# Patient Record
Sex: Male | Born: 1935 | Race: Black or African American | Hispanic: No | Marital: Married | Smoking: Former smoker
Health system: Southern US, Community
[De-identification: ages and names within clinical notes are randomized; demographics above are authoritative.]

## PROBLEM LIST (undated history)

## (undated) DIAGNOSIS — N289 Disorder of kidney and ureter, unspecified: Secondary | ICD-10-CM

## (undated) DIAGNOSIS — F0390 Unspecified dementia without behavioral disturbance: Secondary | ICD-10-CM

## (undated) DIAGNOSIS — N189 Chronic kidney disease, unspecified: Secondary | ICD-10-CM

## (undated) DIAGNOSIS — E785 Hyperlipidemia, unspecified: Secondary | ICD-10-CM

## (undated) DIAGNOSIS — I5189 Other ill-defined heart diseases: Secondary | ICD-10-CM

## (undated) DIAGNOSIS — R351 Nocturia: Secondary | ICD-10-CM

## (undated) DIAGNOSIS — N210 Calculus in bladder: Secondary | ICD-10-CM

## (undated) DIAGNOSIS — D631 Anemia in chronic kidney disease: Secondary | ICD-10-CM

## (undated) DIAGNOSIS — N183 Chronic kidney disease, stage 3 (moderate): Secondary | ICD-10-CM

## (undated) DIAGNOSIS — M1990 Unspecified osteoarthritis, unspecified site: Secondary | ICD-10-CM

## (undated) DIAGNOSIS — I509 Heart failure, unspecified: Secondary | ICD-10-CM

## (undated) DIAGNOSIS — E119 Type 2 diabetes mellitus without complications: Secondary | ICD-10-CM

## (undated) DIAGNOSIS — I10 Essential (primary) hypertension: Secondary | ICD-10-CM

## (undated) DIAGNOSIS — I350 Nonrheumatic aortic (valve) stenosis: Secondary | ICD-10-CM

## (undated) DIAGNOSIS — Z87448 Personal history of other diseases of urinary system: Secondary | ICD-10-CM

## (undated) HISTORY — DX: Anemia in chronic kidney disease: D63.1

## (undated) HISTORY — DX: Chronic kidney disease, unspecified: N18.9

## (undated) HISTORY — DX: Chronic kidney disease, stage 3 (moderate): N18.3

## (undated) HISTORY — PX: COLONOSCOPY: SHX174

## (undated) HISTORY — PX: TONSILLECTOMY: SUR1361

## (undated) HISTORY — DX: Other ill-defined heart diseases: I51.89

## (undated) HISTORY — DX: Nonrheumatic aortic (valve) stenosis: I35.0

---
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
## 2004-08-14 ENCOUNTER — Inpatient Hospital Stay (HOSPITAL_COMMUNITY): Admission: EM | Admit: 2004-08-14 | Discharge: 2004-08-16 | Payer: Self-pay | Admitting: Emergency Medicine

## 2006-09-01 ENCOUNTER — Ambulatory Visit: Payer: Self-pay | Admitting: Family Medicine

## 2010-10-08 ENCOUNTER — Ambulatory Visit: Payer: Self-pay | Admitting: Family Medicine

## 2012-07-04 ENCOUNTER — Encounter (HOSPITAL_COMMUNITY): Payer: Self-pay

## 2012-07-04 ENCOUNTER — Emergency Department (HOSPITAL_COMMUNITY)
Admission: EM | Admit: 2012-07-04 | Discharge: 2012-07-04 | Disposition: A | Discharge status: Home / Self Care | Payer: Medicare Other | Attending: Emergency Medicine | Admitting: Emergency Medicine

## 2012-07-04 DIAGNOSIS — R350 Frequency of micturition: Secondary | ICD-10-CM | POA: Insufficient documentation

## 2012-07-04 DIAGNOSIS — N390 Urinary tract infection, site not specified: Secondary | ICD-10-CM | POA: Insufficient documentation

## 2012-07-04 DIAGNOSIS — Z79899 Other long term (current) drug therapy: Secondary | ICD-10-CM | POA: Insufficient documentation

## 2012-07-04 DIAGNOSIS — R319 Hematuria, unspecified: Secondary | ICD-10-CM | POA: Insufficient documentation

## 2012-07-04 DIAGNOSIS — E119 Type 2 diabetes mellitus without complications: Secondary | ICD-10-CM | POA: Insufficient documentation

## 2012-07-04 LAB — POCT I-STAT, CHEM 8
Hemoglobin: 11.6 g/dL — ABNORMAL LOW (ref 13.0–17.0)
Sodium: 142 mEq/L (ref 135–145)
TCO2: 23 mmol/L (ref 0–100)

## 2012-07-04 LAB — URINALYSIS, ROUTINE W REFLEX MICROSCOPIC
Nitrite: NEGATIVE
Specific Gravity, Urine: 1.022 (ref 1.005–1.030)
Urobilinogen, UA: 0.2 mg/dL (ref 0.0–1.0)

## 2012-07-04 LAB — URINE MICROSCOPIC-ADD ON

## 2012-07-04 MED ORDER — CEPHALEXIN 500 MG PO CAPS: 500.0000 mg | ORAL_CAPSULE | Freq: Four times a day (QID) | ORAL | Status: DC

## 2012-07-04 MED ORDER — CEPHALEXIN 250 MG PO CAPS
500.0000 mg | ORAL_CAPSULE | Freq: Once | ORAL | Status: AC
  Administered 2012-07-04: 500 mg via ORAL
  Filled 2012-07-04: qty 2

## 2012-07-04 NOTE — ED Notes (Signed)
Pt. Was placed on antibiotics 2 weeks ago for UTI .  His doctor informed him if he saw any blood to come to an ED.  Pt. Having frequency and pressure intermittently.

## 2012-07-04 NOTE — ED Provider Notes (Addendum)
History  This chart was scribed for Brian Sprout, MD by Brian [name]. This patient was seen in room TR05C/TR05C and the patient's care was started at 14:34.   CSN: [id number]  Arrival date & time 07/04/12  1329   First MD Initiated Contact with Patient 07/04/12 1434      Chief Complaint  Patient presents with  . Hematuria    (Consider location/radiation/quality/duration/timing/severity/associated sxs/prior treatment) The history is provided by the patient. No language interpreter was used.  Brian [name] is a 78 y.o. male who presents to the Emergency Department complaining of urinary frequency, intermittent pressure, and hematuria since this morning. Pt reports earlier this morning he noticed bright red blood at the beginning of his stream then later he noticed darker blood, with no passed blood clots noticed. Pt reports peeing about 4-5 times today, only sometimes with blood present. Pt was seen by a doctor in [place] a couple weeks ago for a UTI; he was given ciprofloxacin (which he finished 4 days ago) and was told to come to an ED if he notices any blood. The doctor did not do any lab work but he did check his prostate with no issues noted. Pt deneis any associated dysuria now or before taking the antibiotics. Pt denies getting UTIs often. Pt has also been on meloxicann for a few months now. Pt has a h/o arthritis in his back. The pt does not smoke.   Past Medical History  Diagnosis Date  . Diabetes mellitus without complication     History reviewed. No pertinent past surgical history.  No family history on file.  History  Substance Use Topics  . Smoking status: Never Smoker   . Smokeless tobacco: Not on file  . Alcohol Use: Yes     Comment: occassional      Review of Systems  Constitutional: Negative for fever and chills.  Respiratory: Negative for shortness of breath.   Gastrointestinal: Negative for nausea, vomiting and abdominal pain.  Genitourinary:  Positive for frequency and hematuria. Negative for dysuria.  Neurological: Negative for weakness.    Allergies  Review of patient's allergies indicates no known allergies.  Home Medications   Current Outpatient Rx  Name  Route  Sig  Dispense  Refill  . CALCIUM CARBONATE-VITAMIN D 500-200 MG-UNIT PO TABS   Oral   Take 1 tablet by mouth daily.         Marland Kitchen DIPHENOXYLATE-ATROPINE 2.5-0.025 MG PO TABS   Oral   Take 1 tablet by mouth 4 (four) times daily as needed. For diarrhea         . FENOFIBRATE 160 MG PO TABS   Oral   Take 160 mg by mouth daily.         Marland Kitchen FINASTERIDE 5 MG PO TABS   Oral   Take 5 mg by mouth daily.         . GLYBURIDE-METFORMIN 2.5-500 MG PO TABS   Oral   Take 1 tablet by mouth daily with breakfast.         . MELOXICAM 7.5 MG PO TABS   Oral   Take 7.5 mg by mouth daily.         . QUINAPRIL HCL 20 MG PO TABS   Oral   Take 20 mg by mouth at bedtime.           Triage vitals: BP 156/93  Pulse 92  Temp 98.3 F (36.8 C) (Oral)  Resp 20  Ht 5' 10.5" (1.791 m)  Wt 168 lb (76.204 kg)  BMI 23.76 kg/m2  SpO2 94%  Physical Exam  Nursing note and vitals reviewed. Constitutional: He is oriented to person, place, and time. He appears well-developed and well-nourished. No distress.  HENT:  Head: Normocephalic and atraumatic.  Eyes: EOM are normal. Pupils are equal, round, and reactive to light.  Neck: Neck supple. No tracheal deviation present.  Cardiovascular: Normal rate.   Pulmonary/Chest: Effort normal. No respiratory distress.  Abdominal: Soft. He exhibits no distension.  Musculoskeletal: Normal range of motion. He exhibits no edema.  Neurological: He is alert and oriented to person, place, and time.  Skin: Skin is warm and dry.  Psychiatric: He has a normal mood and affect.    ED Course  Procedures (including critical care time) DIAGNOSTIC STUDIES: Oxygen Saturation is 94% on room air, adequate by my interpretation.     COORDINATION OF CARE: 14:45--I evaluated the patient and we discussed a treatment plan including urinalysis to which the pt agreed.   Labs Reviewed  URINALYSIS, ROUTINE W REFLEX MICROSCOPIC - Abnormal; Notable for the following:    Color, Urine RED (*)  BIOCHEMICALS MAY BE AFFECTED BY COLOR   APPearance TURBID (*)     Hgb urine dipstick LARGE (*)     Bilirubin Urine SMALL (*)     Ketones, ur 15 (*)     Protein, ur 100 (*)     Leukocytes, UA SMALL (*)     All other components within normal limits  URINE MICROSCOPIC-ADD ON - Abnormal; Notable for the following:    Squamous Epithelial / LPF FEW (*)     Bacteria, UA FEW (*)     Casts HYALINE CASTS (*)     Crystals CA OXALATE CRYSTALS (*)     All other components within normal limits  POCT I-STAT, CHEM 8 - Abnormal; Notable for the following:    Creatinine, Ser 1.40 (*)     Glucose, Bld 62 (*)     Hemoglobin 11.6 (*)     HCT 34.0 (*)     All other components within normal limits  URINE CULTURE   No results found.   No diagnosis found.    MDM   Patient presenting due to hematuria and frequency. He has normal vital signs today except for a slightly elevated blood pressure. He denies any symptoms concerning for hilar nephritis. He states for the last 2 weeks he had been taking ciprofloxacin for possible urinary tract infection. He denies any symptoms concerning for urinary retention. He has no prior prostate problems and does not smoke. UA shows a large amount of blood with a small amount of leukocytes and bacteria. There are 3-6 white blood cells and may be a resistant infection. A urine culture was done. Patient states he has not had his kidney function checked in quite some time and an i-STAT was ordered  3:15 PM I-stat normal with Cr of 1.4.  Will d/c with keflex and pt has f/u with PCP tomorrow.    I personally performed the services described in this documentation, which was scribed in my presence.  The recorded  information has been reviewed and considered.    Brian Sprout, MD 07/04/12 1510  Brian Sprout, MD 07/04/12 1515

## 2012-07-06 LAB — URINE CULTURE
Colony Count: NO GROWTH
Culture: NO GROWTH

## 2012-08-17 ENCOUNTER — Emergency Department (HOSPITAL_COMMUNITY)
Admission: EM | Admit: 2012-08-17 | Discharge: 2012-08-17 | Disposition: A | Discharge status: Home / Self Care | Payer: Medicare Other | Attending: Emergency Medicine | Admitting: Emergency Medicine

## 2012-08-17 ENCOUNTER — Encounter (HOSPITAL_COMMUNITY): Payer: Self-pay | Admitting: *Deleted

## 2012-08-17 DIAGNOSIS — Z79899 Other long term (current) drug therapy: Secondary | ICD-10-CM | POA: Insufficient documentation

## 2012-08-17 DIAGNOSIS — R739 Hyperglycemia, unspecified: Secondary | ICD-10-CM

## 2012-08-17 DIAGNOSIS — E1169 Type 2 diabetes mellitus with other specified complication: Secondary | ICD-10-CM | POA: Insufficient documentation

## 2012-08-17 LAB — GLUCOSE, CAPILLARY: Glucose-Capillary: 276 mg/dL — ABNORMAL HIGH (ref 70–99)

## 2012-08-17 MED ORDER — GLYBURIDE 5 MG PO TABS: 5.0000 mg | ORAL_TABLET | Freq: Every day | ORAL | Status: DC

## 2012-08-17 MED ORDER — GLYBURIDE 5 MG PO TABS
5.0000 mg | ORAL_TABLET | Freq: Once | ORAL | Status: AC
  Administered 2012-08-17: 5 mg via ORAL
  Filled 2012-08-17: qty 1

## 2012-08-17 NOTE — ED Provider Notes (Signed)
History   This chart was scribed for Brian Sprout, MD by Sofie Rower, ED Scribe. The patient was seen in room TR08C/TR08C and the patient's care was started at 1:13PM.     CSN: [id number]  Arrival date & time 08/17/12  1300   First MD Initiated Contact with Patient 08/17/12 1313      No chief complaint on file.   (Consider location/radiation/quality/duration/timing/severity/associated sxs/prior treatment) Patient is a 78 y.o. male presenting with general illness. The history is provided by the patient. No language interpreter was used.  Illness  The current episode started today. The onset was sudden. The problem occurs rarely. The problem has been gradually worsening. The problem is moderate. Nothing relieves the symptoms. Nothing aggravates the symptoms. He has been behaving normally.    Brian [name] is a 78 y.o. male , with a hx of diabets, who presents to the Emergency Department complaining of sudden, progressively worsening, hyperglycemia, onset today (08/17/12). The pt reports he had a CT scan performed yesterday, 08/16/12 at [facility], however he was unaware that he could not take his metformin medication until 48 hours after the scan. The pt is concerned due to his blood sugar being 348 today, elevated from his baseline.  The pt does not smoke, however, he does drink alcohol occasionally.   PCP is Dr. Thana Ates.    Past Medical History  Diagnosis Date  . Diabetes mellitus without complication     History reviewed. No pertinent past surgical history.  No family history on file.  History  Substance Use Topics  . Smoking status: Never Smoker   . Smokeless tobacco: Not on file  . Alcohol Use: Yes     Comment: occassional      Review of Systems  All other systems reviewed and are negative.    Allergies  Review of patient's allergies indicates no known allergies.  Home Medications   Current Outpatient Rx  Name  Route  Sig  Dispense  Refill  . CALCIUM  CARBONATE-VITAMIN D 500-200 MG-UNIT PO TABS   Oral   Take 1 tablet by mouth daily.         . CEPHALEXIN 500 MG PO CAPS   Oral   Take 1 capsule (500 mg total) by mouth 4 (four) times daily.   28 capsule   0   . DIPHENOXYLATE-ATROPINE 2.5-0.025 MG PO TABS   Oral   Take 1 tablet by mouth 4 (four) times daily as needed. For diarrhea         . FENOFIBRATE 160 MG PO TABS   Oral   Take 160 mg by mouth daily.         Marland Kitchen FINASTERIDE 5 MG PO TABS   Oral   Take 5 mg by mouth daily.         . GLYBURIDE-METFORMIN 2.5-500 MG PO TABS   Oral   Take 1 tablet by mouth daily with breakfast.         . MELOXICAM 7.5 MG PO TABS   Oral   Take 7.5 mg by mouth daily.         . QUINAPRIL HCL 20 MG PO TABS   Oral   Take 20 mg by mouth at bedtime.           BP 170/96  Pulse 104  Temp 98.5 F (36.9 C) (Oral)  Resp 18  SpO2 98%  Physical Exam  Nursing note and vitals reviewed. Constitutional: He is oriented to person, place, and time. He  appears well-developed and well-nourished. No distress.  HENT:  Head: Normocephalic and atraumatic.  Nose: Nose normal.  Mouth/Throat: Oropharynx is clear and moist.  Eyes: Conjunctivae normal and EOM are normal. Pupils are equal, round, and reactive to light.  Neck: Normal range of motion. Neck supple.  Cardiovascular: Normal rate, regular rhythm and intact distal pulses.   No murmur heard. Pulmonary/Chest: Effort normal and breath sounds normal. No respiratory distress. He has no wheezes. He has no rales.  Abdominal: Soft. Bowel sounds are normal. He exhibits no distension. There is no tenderness. There is no rebound and no guarding.  Musculoskeletal: Normal range of motion. He exhibits no edema and no tenderness.  Neurological: He is alert and oriented to person, place, and time.  Skin: Skin is warm and dry. No rash noted. No erythema.  Psychiatric: He has a normal mood and affect. His behavior is normal.    ED Course  Procedures  (including critical care time)  DIAGNOSTIC STUDIES: Oxygen Saturation is 98% on room air, normal by my interpretation.    COORDINATION OF CARE:  1:22 PM- Treatment plan discussed with patient. Pt agrees with treatment.      Labs Reviewed  GLUCOSE, CAPILLARY - Abnormal; Notable for the following:    Glucose-Capillary 276 (*)     All other components within normal limits   No results found.   1. Hyperglycemia       MDM   Patient had a CT scan done yesterday and was taken off his Glucovance. He is unable to take the Glucovance until Thursday but states his blood sugar is starting to run high and he has nothing he can take for it. He is asymptomatic at this time and has no complaints. Patient's blood sugar here is 274. We'll give him glyburide/metformin can take for the next 3 days until he can resume his Glucovance.      I personally performed the services described in this documentation, which was scribed in my presence.  The recorded information has been reviewed and considered.    Brian Sprout, MD 08/17/12 1328

## 2012-08-17 NOTE — ED Notes (Signed)
Pt had CT scan yesterday at [facility] and has to hold off taking metformin till Thursday, but concerned because sugar up to 348 today.  No insulin

## 2013-03-21 ENCOUNTER — Encounter (HOSPITAL_BASED_OUTPATIENT_CLINIC_OR_DEPARTMENT_OTHER): Payer: Self-pay | Admitting: *Deleted

## 2013-03-21 ENCOUNTER — Other Ambulatory Visit: Payer: Self-pay | Admitting: Urology

## 2013-03-21 NOTE — Progress Notes (Signed)
NPO AFTER MN. ARRIVES AT 0900. NEEDS ISTAT AND EKG.

## 2013-03-22 ENCOUNTER — Encounter (HOSPITAL_BASED_OUTPATIENT_CLINIC_OR_DEPARTMENT_OTHER)
Admission: RE | Disposition: A | Discharge status: Home / Self Care | Payer: Self-pay | Source: Ambulatory Visit | Attending: Urology

## 2013-03-22 ENCOUNTER — Encounter (HOSPITAL_BASED_OUTPATIENT_CLINIC_OR_DEPARTMENT_OTHER): Payer: Self-pay | Admitting: *Deleted

## 2013-03-22 ENCOUNTER — Ambulatory Visit (HOSPITAL_BASED_OUTPATIENT_CLINIC_OR_DEPARTMENT_OTHER)
Admission: RE | Admit: 2013-03-22 | Discharge: 2013-03-22 | Disposition: A | Discharge status: Home / Self Care | Payer: Medicare Other | Source: Ambulatory Visit | Attending: Urology | Admitting: Urology

## 2013-03-22 ENCOUNTER — Ambulatory Visit (HOSPITAL_BASED_OUTPATIENT_CLINIC_OR_DEPARTMENT_OTHER): Payer: Medicare Other | Admitting: Anesthesiology

## 2013-03-22 ENCOUNTER — Encounter (HOSPITAL_BASED_OUTPATIENT_CLINIC_OR_DEPARTMENT_OTHER): Payer: Self-pay | Admitting: Anesthesiology

## 2013-03-22 ENCOUNTER — Other Ambulatory Visit: Payer: Self-pay

## 2013-03-22 DIAGNOSIS — I10 Essential (primary) hypertension: Secondary | ICD-10-CM | POA: Insufficient documentation

## 2013-03-22 DIAGNOSIS — Z79899 Other long term (current) drug therapy: Secondary | ICD-10-CM | POA: Insufficient documentation

## 2013-03-22 DIAGNOSIS — N210 Calculus in bladder: Secondary | ICD-10-CM | POA: Insufficient documentation

## 2013-03-22 DIAGNOSIS — E119 Type 2 diabetes mellitus without complications: Secondary | ICD-10-CM | POA: Insufficient documentation

## 2013-03-22 HISTORY — DX: Hyperlipidemia, unspecified: E78.5

## 2013-03-22 HISTORY — DX: Nocturia: R35.1

## 2013-03-22 HISTORY — PX: CYSTOSCOPY/RETROGRADE/URETEROSCOPY/STONE EXTRACTION WITH BASKET: SHX5317

## 2013-03-22 HISTORY — DX: Essential (primary) hypertension: I10

## 2013-03-22 HISTORY — DX: Calculus in bladder: N21.0

## 2013-03-22 HISTORY — DX: Type 2 diabetes mellitus without complications: E11.9

## 2013-03-22 HISTORY — DX: Personal history of other diseases of urinary system: Z87.448

## 2013-03-22 LAB — POCT I-STAT 4, (NA,K, GLUC, HGB,HCT)
Glucose, Bld: 145 mg/dL — ABNORMAL HIGH (ref 70–99)
HCT: 38 % — ABNORMAL LOW (ref 39.0–52.0)
Hemoglobin: 12.9 g/dL — ABNORMAL LOW (ref 13.0–17.0)

## 2013-03-22 LAB — GLUCOSE, CAPILLARY: Glucose-Capillary: 132 mg/dL — ABNORMAL HIGH (ref 70–99)

## 2013-03-22 SURGERY — CYSTOSCOPY, WITH CALCULUS REMOVAL USING BASKET
Anesthesia: General | Site: Bladder | Wound class: Clean Contaminated

## 2013-03-22 MED ORDER — ONDANSETRON HCL 4 MG/2ML IJ SOLN
INTRAMUSCULAR | Status: DC | PRN
  Administered 2013-03-22: 4 mg via INTRAVENOUS

## 2013-03-22 MED ORDER — PROPOFOL 10 MG/ML IV BOLUS
INTRAVENOUS | Status: DC | PRN
  Administered 2013-03-22: 50 mg via INTRAVENOUS
  Administered 2013-03-22: 150 mg via INTRAVENOUS

## 2013-03-22 MED ORDER — FENTANYL CITRATE 0.05 MG/ML IJ SOLN
INTRAMUSCULAR | Status: DC | PRN
  Administered 2013-03-22 (×2): 50 ug via INTRAVENOUS

## 2013-03-22 MED ORDER — LIDOCAINE HCL (CARDIAC) 20 MG/ML IV SOLN
INTRAVENOUS | Status: DC | PRN
  Administered 2013-03-22: 50 mg via INTRAVENOUS

## 2013-03-22 MED ORDER — MIDAZOLAM HCL 5 MG/5ML IJ SOLN
INTRAMUSCULAR | Status: DC | PRN
  Administered 2013-03-22: 1 mg via INTRAVENOUS

## 2013-03-22 MED ORDER — LACTATED RINGERS IV SOLN
INTRAVENOUS | Status: DC
  Administered 2013-03-22: 12:00:00 via INTRAVENOUS
  Filled 2013-03-22: qty 1000

## 2013-03-22 MED ORDER — LACTATED RINGERS IV SOLN
INTRAVENOUS | Status: DC
Start: 2013-03-22 — End: 2013-03-22
  Administered 2013-03-22: 10:00:00 via INTRAVENOUS
  Filled 2013-03-22: qty 1000

## 2013-03-22 MED ORDER — CEFAZOLIN SODIUM 1-5 GM-% IV SOLN
1.0000 g | INTRAVENOUS | Status: DC
  Filled 2013-03-22: qty 50

## 2013-03-22 MED ORDER — CEFAZOLIN SODIUM-DEXTROSE 2-3 GM-% IV SOLR
2.0000 g | Freq: Once | INTRAVENOUS | Status: DC
  Filled 2013-03-22: qty 50

## 2013-03-22 MED ORDER — FENTANYL CITRATE 0.05 MG/ML IJ SOLN
25.0000 ug | INTRAMUSCULAR | Status: DC | PRN
  Filled 2013-03-22: qty 1

## 2013-03-22 MED ORDER — SODIUM CHLORIDE 0.9 % IR SOLN
Status: DC | PRN
  Administered 2013-03-22: 3000 mL

## 2013-03-22 SURGICAL SUPPLY — 39 items
ADAPTER CATH URET PLST 4-6FR (CATHETERS) IMPLANT
ADPR CATH URET STRL DISP 4-6FR (CATHETERS)
BAG DRAIN URO-CYSTO SKYTR STRL (DRAIN) ×3 IMPLANT
BAG DRN UROCATH (DRAIN) ×2
BASKET LASER NITINOL 1.9FR (BASKET) IMPLANT
BASKET STNLS GEMINI 4WIRE 3FR (BASKET) IMPLANT
BASKET ZERO TIP NITINOL 2.4FR (BASKET) ×3 IMPLANT
BRUSH URET BIOPSY 3F (UROLOGICAL SUPPLIES) IMPLANT
BSKT STON RTRVL 120 1.9FR (BASKET)
BSKT STON RTRVL GEM 120X11 3FR (BASKET)
BSKT STON RTRVL ZERO TP 2.4FR (BASKET) ×2
CANISTER SUCT LVC 12 LTR MEDI- (MISCELLANEOUS) ×3 IMPLANT
CATH INTERMIT  6FR 70CM (CATHETERS) IMPLANT
CATH URET 5FR 28IN CONE TIP (BALLOONS)
CATH URET 5FR 28IN OPEN ENDED (CATHETERS) IMPLANT
CATH URET 5FR 70CM CONE TIP (BALLOONS) IMPLANT
CLOTH BEACON ORANGE TIMEOUT ST (SAFETY) ×3 IMPLANT
DRAPE CAMERA CLOSED 9X96 (DRAPES) ×3 IMPLANT
ELECT REM PT RETURN 9FT ADLT (ELECTROSURGICAL)
ELECTRODE REM PT RTRN 9FT ADLT (ELECTROSURGICAL) IMPLANT
GLOVE BIO SURGEON STRL SZ7 (GLOVE) ×3 IMPLANT
GLOVE ECLIPSE 6.5 STRL STRAW (GLOVE) ×3 IMPLANT
GLOVE INDICATOR 6.5 STRL GRN (GLOVE) ×6 IMPLANT
GOWN PREVENTION PLUS LG XLONG (DISPOSABLE) ×3 IMPLANT
GUIDEWIRE 0.038 PTFE COATED (WIRE) IMPLANT
GUIDEWIRE ANG ZIPWIRE 038X150 (WIRE) IMPLANT
GUIDEWIRE STR DUAL SENSOR (WIRE) IMPLANT
IV NS IRRIG 3000ML ARTHROMATIC (IV SOLUTION) ×3 IMPLANT
KIT BALLIN UROMAX 15FX10 (LABEL) IMPLANT
KIT BALLN UROMAX 15FX4 (MISCELLANEOUS) IMPLANT
KIT BALLN UROMAX 26 75X4 (MISCELLANEOUS)
LASER FIBER DISP (UROLOGICAL SUPPLIES) IMPLANT
LASER FIBER DISP 1000U (UROLOGICAL SUPPLIES) IMPLANT
PACK CYSTOSCOPY (CUSTOM PROCEDURE TRAY) ×3 IMPLANT
SET HIGH PRES BAL DIL (LABEL)
SHEATH ACCESS URETERAL 38CM (SHEATH) IMPLANT
SHEATH ACCESS URETERAL 54CM (SHEATH) IMPLANT
SHEATH URET ACCESS 12FR/35CM (UROLOGICAL SUPPLIES) IMPLANT
SHEATH URET ACCESS 12FR/55CM (UROLOGICAL SUPPLIES) IMPLANT

## 2013-03-22 NOTE — Procedures (Signed)
Ok for patient to leave---- voided 50cc

## 2013-03-22 NOTE — Anesthesia Postprocedure Evaluation (Signed)
  Anesthesia Post-op Note  Patient: Brian [name]  Procedure(s) Performed: Procedure(s) (LRB): CYSTOSCOPY BLADDER STONE STONE EXTRACTION (N/A)  Patient Location: PACU  Anesthesia Type: General  Level of Consciousness: awake and alert   Airway and Oxygen Therapy: Patient Spontanous Breathing  Post-op Pain: mild  Post-op Assessment: Post-op Vital signs reviewed, Patient's Cardiovascular Status Stable, Respiratory Function Stable, Patent Airway and No signs of Nausea or vomiting  Last Vitals:  Filed Vitals:   03/22/13 1145  BP: 130/76  Pulse: 72  Temp:   Resp: 16    Post-op Vital Signs: stable   Complications: No apparent anesthesia complications

## 2013-03-22 NOTE — H&P (Signed)
History of Present Illness  [name] returns today still complaining of hesitancy, decreased stream, dribbling, sensation of incomplete bladder emptying.  He has not passed a stone.  Cystoscopy shows a stone in the prostatic urethra.  I was able to push it back in the bladder. He needs stone extraction.       [name] has a history of hematuria.  A CT hematuria protocol in 2013 showed a punctate left renal calculus and 2 stones in the right distal ureter, a left renal cyst and left adrenal adenoma.  It was thought that he passed both ureteral stones.  Cystoscopy for hematuria was negative for malignancy.  He was on tamsulosin and finasteride but is no longer on either medication.  Interval history: [name] presents today with pain at the end of his penis while voiding. He says it feels like he is passing a stone. This has been present for less than 1 week.  Nothing makes it better or worse.  It is not associated with flank pain, other LUTS, hematuria, n/v, or f/c.   Past Medical History Problems  1. History of  Arthritis V13.4 2. History of  Diabetes Mellitus 250.00  Surgical History Problems  1. History of  Complete Colonoscopy 2. History of  Rectal Surgery  Current Meds 1. Fenofibrate 160 MG Oral Tablet; Therapy: 11Dec2013 to 2. GlyBURIDE 2.5 MG Oral Tablet; Therapy: (Recorded:19Dec2013) to 3. Lomotil 2.5-0.025 MG Oral Tablet; Therapy: (Recorded:19Dec2013) to 4. Meloxicam 7.5 MG Oral Tablet; Therapy: (Recorded:19Dec2013) to 5. Proscar TABS; Therapy: (Recorded:19Dec2013) to 6. Quinapril HCl 20 MG Oral Tablet; Therapy: (Recorded:19Dec2013) to 7. Tamsulosin HCl 0.4 MG Oral Capsule; TAKE 1 CAPSULE Bedtime; Therapy: 16Jan2014 to (Last  Rx:16Jan2014)  Requested for: 16Jan2014 8. Tamsulosin HCl 0.4 MG Oral Capsule; Take one (1) capsule daily; Therapy: 23Dec2013 to  (Evaluate:22Jan2014)  Requested for: 23Dec2013; Last Rx:23Dec2013 9. Vitamin D TABS; Therapy: (Recorded:19Dec2013)  to  Allergies Medication  1. No Known Drug Allergies  Family History Problems  1. Family history of  Congestive Heart Failure With Systolic And Diastolic Dysfunction 2. Family history of  Family Health Status Number Of Children 3 sons / 1 daughter 3. Family history of  Father Deceased At Age ____ 4. Family history of  Mother Deceased At Age ____  Social History Problems  1. Caffeine Use 1 2. Marital History - Currently Married 3. Never A Smoker 4. Retired From Work Denied  5. History of  Alcohol Use  Review of Systems Genitourinary, constitutional, skin, eye, otolaryngeal, hematologic/lymphatic, cardiovascular, pulmonary, endocrine, musculoskeletal, gastrointestinal, neurological and psychiatric system(s) were reviewed and pertinent findings if present are noted.  Genitourinary: urinary frequency, feelings of urinary urgency, difficulty starting the urinary stream, weak urinary stream, urinary stream starts and stops, incomplete emptying of bladder and initiating urination requires straining.    Vitals Vital Signs [Data Includes: Last 1 Day]  28Jul2014 02:07PM  Blood Pressure: 157 / 85 Heart Rate: 90 Respiration: 18  Physical Exam Constitutional: Well nourished and well developed . No acute distress.  ENT:. The ears and nose are normal in appearance.  Neck: The appearance of the neck is normal and no neck mass is present.  Pulmonary: No respiratory distress and normal respiratory rhythm and effort.  Cardiovascular: Heart rate and rhythm are normal . No peripheral edema.  Abdomen: The abdomen is soft and nontender. No masses are palpated. No CVA tenderness. No hernias are palpable. No hepatosplenomegaly noted.  Genitourinary: Examination of the penis demonstrates no discharge, no masses, no lesions  and a normal meatus. The scrotum is without lesions. The right epididymis is palpably normal and non-tender. The left epididymis is palpably normal and non-tender. The right testis  is non-tender and without masses. The left testis is non-tender and without masses.  Lymphatics: The femoral and inguinal nodes are not enlarged or tender.  Skin: Normal skin turgor, no visible rash and no visible skin lesions.  Neuro/Psych:. Mood and affect are appropriate.    Results/Data Urine [Data Includes: Last 1 Day]   28Jul2014  COLOR YELLOW   APPEARANCE CLEAR   SPECIFIC GRAVITY 1.025   pH 5.5   GLUCOSE 100 mg/dL  BILIRUBIN NEG   KETONE NEG mg/dL  BLOOD MOD   PROTEIN NEG mg/dL  UROBILINOGEN 1 mg/dL  NITRITE NEG   LEUKOCYTE ESTERASE NEG   SQUAMOUS EPITHELIAL/HPF FEW   WBC 0-2 WBC/hpf  RBC 7-10 RBC/hpf  BACTERIA NONE SEEN   CRYSTALS NONE SEEN   CASTS NONE SEEN    Procedure  Procedure: Cystoscopy   Indication: Lower Urinary Tract Symptoms.  Informed Consent: Risks, benefits, and potential adverse events were discussed and informed consent was obtained from the patient . Specific risks including, but not limited to bleeding, infection, pain, allergic reaction etc. were explained.  Prep: The patient was prepped with betadine.  Anesthesia:. Local anesthesia was administered intraurethrally with 2% lidocaine jelly.  Antibiotic prophylaxis: Ciprofloxacin.  Procedure Note:  Urethral meatus:. No abnormalities.  Anterior urethra: No abnormalities.  Prostatic urethra:. The lateral and median prostatic lobes were enlarged.  Bladder: Visulization was clear. The ureteral orifices were in the normal anatomic position bilaterally. A systematic survey of the bladder demonstrated no bladder tumors or stones. The mucosa was smooth without abnormalities. A single  a stone was present measuring approximately 6 cm in size.    Assessment Assessed  1. Bladder Calculus 594.1  Plan Health Maintenance (V70.0)  1. UA With REFLEX  Done: 28Jul2014 01:52PM   Cystoscopy. Stone extraction in AM.  The procedure, risks, benefits were explained to the patient and his wife.  The risks include but  are not limited to hemorrhage, infection, bladder injury.  They understand and are agreeable.

## 2013-03-22 NOTE — Op Note (Signed)
Brian [name] is a 79 y.o.   03/22/2013  General  Preop Diagnosis: Bladder calculus  Postop Diagnosis: Same  Procedure done: Cystoscopy, bladder stone extraction  Surgeon: Wendie Simmer. Talyah Seder  Anesthesia: General  Indication: Patient is a 79 years old male who was seen in the office 5 days ago complaining of frequency, hesitancy, straining on urination and voiding small amount of urine at a time. He has a past history of kidney stone. Abdomen ultrasound showed normal kidneys and a possible stone in the bladder. Cystoscopy yesterday showed a 6 mm stone in the prostatic urethra. I manipulated the stone back in the bladder. He is a scheduled today for bladder stone extraction  Procedure: Patient was identified by his wrist band and proper timeout was taken.  Under general anesthesia he was prepped and draped and placed in the dorsolithotomy position. A panendoscope was inserted in the bladder. The anterior urethra is normal. He has a trilobar prostatic hypertrophy. The stone is  identified at the bladder neck. With irrigation the stone migrated back in the bladder. A Nitinol basket was passed through the cystoscope and the stone was caught within the wires of the basket and extracted without difficulty. The cystoscope was then reinserted in the bladder. The bladder mucosa is normal. There is no tumor in the bladder. There is no residual stone in the bladder. The ureteral orifices are in normal position and shape.  The bladder was then emptied and the cystoscope removed.  The patient tolerated the procedure well and left the OR in satisfactory condition to postanesthesia care unit.

## 2013-03-22 NOTE — Anesthesia Preprocedure Evaluation (Addendum)
Anesthesia Evaluation  Patient identified by MRN, date of birth, ID band Patient awake    Reviewed: Allergy & Precautions, H&P , NPO status , Patient's Chart, lab work & pertinent test results  Airway Mallampati: II TM Distance: >3 FB Neck ROM: full    Dental  (+) Edentulous Upper, Edentulous Lower and Dental Advisory Given   Pulmonary neg pulmonary ROS,  breath sounds clear to auscultation  Pulmonary exam normal       Cardiovascular Exercise Tolerance: Good hypertension, Pt. on medications Rhythm:regular Rate:Normal     Neuro/Psych negative neurological ROS  negative psych ROS   GI/Hepatic negative GI ROS, Neg liver ROS,   Endo/Other  diabetes, Well Controlled, Type 2, Oral Hypoglycemic Agents  Renal/GU negative Renal ROS  negative genitourinary   Musculoskeletal   Abdominal   Peds  Hematology negative hematology ROS (+)   Anesthesia Other Findings   Reproductive/Obstetrics negative OB ROS                          Anesthesia Physical Anesthesia Plan  ASA: III  Anesthesia Plan: General   Post-op Pain Management:    Induction: Intravenous  Airway Management Planned: LMA  Additional Equipment:   Intra-op Plan:   Post-operative Plan:   Informed Consent: I have reviewed the patients History and Physical, chart, labs and discussed the procedure including the risks, benefits and alternatives for the proposed anesthesia with the patient or authorized representative who has indicated his/her understanding and acceptance.   Dental Advisory Given  Plan Discussed with: CRNA and Surgeon  Anesthesia Plan Comments:         Anesthesia Quick Evaluation

## 2013-03-22 NOTE — Transfer of Care (Signed)
Immediate Anesthesia Transfer of Care Note  Patient: Brian [name]  Procedure(s) Performed: Procedure(s) with comments: CYSTOSCOPY BLADDER STONE STONE EXTRACTION (N/A) - CYSTOSCOPY    Patient Location: PACU  Anesthesia Type:General  Level of Consciousness: sedated  Airway & Oxygen Therapy: Patient Spontanous Breathing and Patient connected to nasal cannula oxygen  Post-op Assessment: Report given to PACU RN and Post -op Vital signs reviewed and stable  Post vital signs: stable  Complications: No apparent anesthesia complications

## 2013-03-23 ENCOUNTER — Encounter (HOSPITAL_BASED_OUTPATIENT_CLINIC_OR_DEPARTMENT_OTHER): Payer: Self-pay | Admitting: Urology

## 2013-11-28 ENCOUNTER — Ambulatory Visit: Payer: Self-pay | Admitting: Nephrology

## 2013-12-15 ENCOUNTER — Other Ambulatory Visit (HOSPITAL_COMMUNITY): Payer: Self-pay | Admitting: Urology

## 2013-12-15 DIAGNOSIS — D49519 Neoplasm of unspecified behavior of unspecified kidney: Secondary | ICD-10-CM

## 2013-12-28 ENCOUNTER — Ambulatory Visit (HOSPITAL_COMMUNITY)
Admission: RE | Admit: 2013-12-28 | Discharge: 2013-12-28 | Disposition: A | Discharge status: Home / Self Care | Payer: Medicare Other | Source: Ambulatory Visit | Attending: Urology | Admitting: Urology

## 2013-12-28 DIAGNOSIS — D49519 Neoplasm of unspecified behavior of unspecified kidney: Secondary | ICD-10-CM

## 2013-12-28 DIAGNOSIS — N281 Cyst of kidney, acquired: Secondary | ICD-10-CM | POA: Insufficient documentation

## 2013-12-28 DIAGNOSIS — D3500 Benign neoplasm of unspecified adrenal gland: Secondary | ICD-10-CM | POA: Insufficient documentation

## 2013-12-28 LAB — POCT I-STAT CREATININE: Creatinine, Ser: 1.7 mg/dL — ABNORMAL HIGH (ref 0.50–1.35)

## 2013-12-28 MED ORDER — GADOBENATE DIMEGLUMINE 529 MG/ML IV SOLN
15.0000 mL | Freq: Once | INTRAVENOUS | Status: AC | PRN
  Administered 2013-12-28: 15 mL via INTRAVENOUS

## 2015-02-13 ENCOUNTER — Other Ambulatory Visit: Payer: Self-pay

## 2015-02-13 DIAGNOSIS — I10 Essential (primary) hypertension: Secondary | ICD-10-CM

## 2015-02-13 NOTE — Telephone Encounter (Signed)
Received a fax from [place], requesting a refill on Quinapril HCL 20mg 

## 2015-02-14 ENCOUNTER — Other Ambulatory Visit: Payer: Self-pay

## 2015-02-14 DIAGNOSIS — I10 Essential (primary) hypertension: Secondary | ICD-10-CM

## 2015-02-14 MED ORDER — QUINAPRIL HCL 20 MG PO TABS: 20.0000 mg | ORAL_TABLET | Freq: Two times a day (BID) | ORAL | Status: DC

## 2015-02-14 MED ORDER — QUINAPRIL HCL 20 MG PO TABS: 20.0000 mg | ORAL_TABLET | Freq: Every day | ORAL | Status: DC

## 2015-02-14 NOTE — Telephone Encounter (Signed)
I got a call from [place] from [facility] wanting clarity if the rx for Quinapril should be 1/day or BID, after consulting with Dr. Nadine Counts, A verbal was given and a corrected rx was re-submitted.

## 2015-03-28 ENCOUNTER — Other Ambulatory Visit: Payer: Self-pay | Admitting: Family Medicine

## 2015-03-28 ENCOUNTER — Other Ambulatory Visit: Payer: Self-pay

## 2015-03-28 DIAGNOSIS — E1129 Type 2 diabetes mellitus with other diabetic kidney complication: Secondary | ICD-10-CM | POA: Insufficient documentation

## 2015-03-28 MED ORDER — GLUCOSE BLOOD VI STRP: ORAL_STRIP | Status: DC

## 2015-03-28 NOTE — Telephone Encounter (Signed)
Received a fax from [place] requested a refill of this patient's One Touch Ultra test strips #300.  Refill request was sent to Dr. [name] for approval and submission.

## 2015-04-12 ENCOUNTER — Other Ambulatory Visit: Payer: Self-pay

## 2015-04-12 NOTE — Telephone Encounter (Signed)
Got a fax from [facility] requesting a refill of this medication.  Refill request was sent to Dr. [name] for approval and submission.  Diphenoxylate-Atropine 2.5-0.025MG , 1 (one) Tablet take 1 or 2 tablets four times a day as needed for losse stools, #30, 10 days starting 07/04/2014, Ref. x5. Discontinued (Patient not using consistently). Associated Diagnosis:Diarrhea Recorded 01/16/2015 09:36 AM by [name], MD, Annotation/Addendum.

## 2015-04-24 ENCOUNTER — Other Ambulatory Visit: Payer: Self-pay

## 2015-04-24 DIAGNOSIS — K581 Irritable bowel syndrome with constipation: Secondary | ICD-10-CM

## 2015-04-24 MED ORDER — DOCUSATE SODIUM 100 MG PO CAPS: 100.0000 mg | ORAL_CAPSULE | Freq: Two times a day (BID) | ORAL | Status: DC

## 2015-04-24 MED ORDER — DIPHENOXYLATE-ATROPINE 2.5-0.025 MG PO TABS: 1.0000 | ORAL_TABLET | Freq: Four times a day (QID) | ORAL | Status: DC | PRN

## 2015-04-24 NOTE — Telephone Encounter (Signed)
Got a refill request for this medication from [place].  Refill request was sent to Dr. [name] for approval and submission.  Diphenoxylate-Atropine 2.5-0.025MG , 1 (one) Tablet take 1 or 2 tablets four times a day as needed for losse stools, #30, 10 days starting 07/04/2014, Ref. x5. Inactive. Associated Diagnosis:Diarrhea  Colace 100MG , 1 (one) Capsule Capsule two times daily, #60, 30 days starting 09/19/2014, Ref. x5. Active. Recorded 09/19/2014 10:09 AM by [name], MD, Annotation/Addendum. ePrescribed to [phone], 09/19/2014 10:09 AM  Recorded 04/14/2015 06:39 PM by [name], MD, Annotation/Addendum.

## 2015-05-01 ENCOUNTER — Telehealth: Payer: Self-pay | Admitting: Family Medicine

## 2015-05-01 ENCOUNTER — Other Ambulatory Visit: Payer: Self-pay

## 2015-05-01 DIAGNOSIS — K581 Irritable bowel syndrome with constipation: Secondary | ICD-10-CM

## 2015-05-01 NOTE — Telephone Encounter (Signed)
Received a fax from [place], Refill request was sent to Dr. [name] for approval and submission.

## 2015-05-01 NOTE — Telephone Encounter (Signed)
Requesting refill on Diphenoxylate-Atropine. Please call [facility]. Thank you

## 2015-05-01 NOTE — Telephone Encounter (Signed)
Got a fax from [place] requesting a refill of Lomotil  Refill request was sent to Dr. [name] for approval and submission.

## 2015-05-03 ENCOUNTER — Other Ambulatory Visit: Payer: Self-pay | Admitting: Family Medicine

## 2015-05-03 DIAGNOSIS — E1129 Type 2 diabetes mellitus with other diabetic kidney complication: Secondary | ICD-10-CM

## 2015-05-03 MED ORDER — SITAGLIPTIN PHOSPHATE 25 MG PO TABS: 25.0000 mg | ORAL_TABLET | Freq: Every day | ORAL | Status: DC

## 2015-05-08 ENCOUNTER — Ambulatory Visit (INDEPENDENT_AMBULATORY_CARE_PROVIDER_SITE_OTHER): Payer: 59 | Admitting: Family Medicine

## 2015-05-08 ENCOUNTER — Other Ambulatory Visit: Payer: Self-pay | Admitting: Family Medicine

## 2015-05-08 ENCOUNTER — Encounter: Payer: Self-pay | Admitting: Family Medicine

## 2015-05-08 VITALS — BP 128/72 | HR 77 | Temp 97.9°F | Resp 18 | Ht 67.0 in | Wt 175.2 lb

## 2015-05-08 DIAGNOSIS — N183 Chronic kidney disease, stage 3 unspecified: Secondary | ICD-10-CM

## 2015-05-08 DIAGNOSIS — N401 Enlarged prostate with lower urinary tract symptoms: Secondary | ICD-10-CM

## 2015-05-08 DIAGNOSIS — I10 Essential (primary) hypertension: Secondary | ICD-10-CM | POA: Insufficient documentation

## 2015-05-08 DIAGNOSIS — Z23 Encounter for immunization: Secondary | ICD-10-CM

## 2015-05-08 DIAGNOSIS — N2581 Secondary hyperparathyroidism of renal origin: Secondary | ICD-10-CM | POA: Insufficient documentation

## 2015-05-08 DIAGNOSIS — E785 Hyperlipidemia, unspecified: Secondary | ICD-10-CM

## 2015-05-08 DIAGNOSIS — E1129 Type 2 diabetes mellitus with other diabetic kidney complication: Secondary | ICD-10-CM | POA: Diagnosis not present

## 2015-05-08 DIAGNOSIS — D631 Anemia in chronic kidney disease: Secondary | ICD-10-CM | POA: Insufficient documentation

## 2015-05-08 DIAGNOSIS — N400 Enlarged prostate without lower urinary tract symptoms: Secondary | ICD-10-CM | POA: Insufficient documentation

## 2015-05-08 DIAGNOSIS — N1832 Chronic kidney disease, stage 3b: Secondary | ICD-10-CM | POA: Insufficient documentation

## 2015-05-08 DIAGNOSIS — N058 Unspecified nephritic syndrome with other morphologic changes: Secondary | ICD-10-CM | POA: Diagnosis not present

## 2015-05-08 DIAGNOSIS — K5730 Diverticulosis of large intestine without perforation or abscess without bleeding: Secondary | ICD-10-CM | POA: Insufficient documentation

## 2015-05-08 DIAGNOSIS — N189 Chronic kidney disease, unspecified: Secondary | ICD-10-CM

## 2015-05-08 DIAGNOSIS — K581 Irritable bowel syndrome with constipation: Secondary | ICD-10-CM | POA: Insufficient documentation

## 2015-05-08 HISTORY — DX: Chronic kidney disease, stage 3 unspecified: N18.30

## 2015-05-08 HISTORY — DX: Essential (primary) hypertension: I10

## 2015-05-08 HISTORY — DX: Anemia in chronic kidney disease: D63.1

## 2015-05-08 LAB — POCT GLYCOSYLATED HEMOGLOBIN (HGB A1C): HEMOGLOBIN A1C: 7.9

## 2015-05-08 LAB — POCT UA - MICROALBUMIN: Microalbumin Ur, POC: NEGATIVE mg/L

## 2015-05-08 MED ORDER — INSULIN DETEMIR 100 UNIT/ML FLEXPEN: 8.0000 [IU] | PEN_INJECTOR | Freq: Every day | SUBCUTANEOUS | Status: DC

## 2015-05-08 NOTE — Progress Notes (Signed)
Name: Brian [name]   MRN: [id number]    DOB: 1933/12/31   Date:05/08/2015       Progress Note  Subjective  Chief Complaint  Chief Complaint  Patient presents with  . Diabetes    patient is here for a 22-monthfollow-up    HPI  CAnjelo Pullmanis a 81year old male with a past medical history significant for CKD III, Diabetes Type II, HLD with statin intolerance, HTN, chronic constipation IBS sometimes alternative with diarrhea, BPH. He was first diagnosed with Diabetes Type II several years ago and has had a hard time with consistent glycemic control due to compliance with diet and general disease progression. Today he reports consistently using his Januvia 25 mg a day, glimepiride 2 mg twice a day, Levemir 7 units daily. His diet is not consistent with a diabetic diet. Related symptoms include fatigue and do not include paresthesia, polydipsia, polyuria, wounds, ulcers and gastroparesis. Last diabetic eye exam is unknown. For his HTN he is taking quinapril 20 mg one a day with no reported chest pain, palpitations, edema.  Following up with Nephrologist periodically for monitoring of CKD III which is a result of years of HTN, DM II, HLD. For his HLD he is taking no statin due to intolerance.   Patient Active Problem List   Diagnosis Date Noted  . Hypertension goal BP (blood pressure) < 140/90 05/08/2015  . Hyperlipidemia LDL goal <100 05/08/2015  . Diverticulosis of colon 05/08/2015  . Secondary hyperparathyroidism of renal origin 05/08/2015  . Chronic kidney disease (CKD), stage III (moderate) 05/08/2015  . Irritable bowel syndrome with constipation 05/08/2015  . Anemia of chronic renal failure 05/08/2015  . Benign localized prostatic hyperplasia with lower urinary tract symptoms (LUTS) 05/08/2015  . Need for immunization against influenza 05/08/2015  . Type 2 diabetes, controlled, with renal manifestation 03/28/2015    Social History  Substance Use Topics  . Smoking status:  Former Smoker -- 0.75 packs/day for 39 years    Types: Cigarettes    Quit date: 03/21/1993  . Smokeless tobacco: Not on file  . Alcohol Use: No     Current outpatient prescriptions:  .  amLODipine (NORVASC) 10 MG tablet, Take 1 tablet by mouth daily., Disp: , Rfl:  .  Blood Glucose Monitoring Suppl (ONE TOUCH ULTRA 2) W/DEVICE KIT, , Disp: , Rfl:  .  carvedilol (COREG) 6.25 MG tablet, Take 1 tablet by mouth 2 (two) times daily., Disp: , Rfl:  .  Cholecalciferol (VITAMIN D3) 1000 UNITS CAPS, Take 1 capsule by mouth daily., Disp: , Rfl:  .  diphenoxylate-atropine (LOMOTIL) 2.5-0.025 MG per tablet, Take 1 tablet by mouth 4 (four) times daily as needed for diarrhea or loose stools., Disp: 30 tablet, Rfl: 5 .  docusate sodium (COLACE) 100 MG capsule, Take 1 capsule (100 mg total) by mouth 2 (two) times daily., Disp: 60 capsule, Rfl: 5 .  fenofibrate 160 MG tablet, Take 160 mg by mouth daily., Disp: , Rfl:  .  finasteride (PROSCAR) 5 MG tablet, Take 5 mg by mouth daily., Disp: , Rfl:  .  glimepiride (AMARYL) 2 MG tablet, Take 1 tablet by mouth 2 (two) times daily., Disp: , Rfl:  .  glucose blood test strip, Use as instructed, Disp: 300 each, Rfl: 3 .  meloxicam (MOBIC) 7.5 MG tablet, Take 7.5 mg by mouth 2 (two) times daily. , Disp: , Rfl:  .  quinapril (ACCUPRIL) 20 MG tablet, Take 1 tablet (20 mg total) by  mouth 2 (two) times daily., Disp: 60 tablet, Rfl: 5 .  sitaGLIPtin (JANUVIA) 25 MG tablet, Take 1 tablet (25 mg total) by mouth daily., Disp: 30 tablet, Rfl: 5 .  Tamsulosin HCl (FLOMAX) 0.4 MG CAPS, Take 0.4 mg by mouth daily., Disp: , Rfl:   Past Surgical History  Procedure Laterality Date  . Cystoscopy/retrograde/ureteroscopy/stone extraction with basket N/A 03/22/2013    Procedure: CYSTOSCOPY BLADDER STONE STONE EXTRACTION;  Surgeon: Lindaann Slough, MD;  Location: [facility];  Service: Urology;  Laterality: N/A;  CYSTOSCOPY      History reviewed. No pertinent family  history.  Allergies  Allergen Reactions  . Niaspan [Niacin Er]   . Statins Other (See Comments)    Myalgia  . Zetia [Ezetimibe]      Review of Systems  CONSTITUTIONAL: No significant weight changes, fever, chills, weakness or fatigue.  HEENT:  - Eyes: No visual changes.  - Ears: No auditory changes. No pain.  - Nose: No sneezing, congestion, runny nose. - Throat: No sore throat. No changes in swallowing. SKIN: No rash or itching.  CARDIOVASCULAR: No chest pain, chest pressure or chest discomfort. No palpitations or edema.  RESPIRATORY: No shortness of breath, cough or sputum.  GASTROINTESTINAL: No anorexia, nausea, vomiting. No changes in bowel habits. No abdominal pain or blood.  GENITOURINARY: No dysuria. No frequency. No discharge.  NEUROLOGICAL: No headache, dizziness, syncope, paralysis, ataxia, numbness or tingling in the extremities. No memory changes. No change in bowel or bladder control.  MUSCULOSKELETAL: No joint pain. No muscle pain. HEMATOLOGIC: No anemia, bleeding or bruising.  LYMPHATICS: No enlarged lymph nodes.  PSYCHIATRIC: No change in mood. No change in sleep pattern.  ENDOCRINOLOGIC: No reports of sweating, cold or heat intolerance. No polyuria or polydipsia.     Objective  BP 128/72 mmHg  Pulse 77  Temp(Src) 97.9 F (36.6 C) (Oral)  Resp 18  Ht 5\' 7"  (1.702 m)  Wt 175 lb 3.2 oz (79.47 kg)  BMI 27.43 kg/m2  SpO2 97% Body mass index is 27.43 kg/(m^2).  Physical Exam  Constitutional: Patient appears well-developed and well-nourished. In no distress.  HEENT:  - Head: Normocephalic and atraumatic.  - Ears: Bilateral TMs gray, no erythema or effusion - Nose: Nasal mucosa moist - Mouth/Throat: Oropharynx is clear and moist. No tonsillar hypertrophy or erythema. No post nasal drainage.  - Eyes: Conjunctivae clear, EOM movements normal. PERRLA. No scleral icterus.  Neck: Normal range of motion. Neck supple. No JVD present. No thyromegaly present.   Cardiovascular: Normal rate, regular rhythm and normal heart sounds.  No murmur heard.  Pulmonary/Chest: Effort normal and breath sounds normal. No respiratory distress. Musculoskeletal: Normal range of motion bilateral UE and LE, no joint effusions. Peripheral vascular: Bilateral LE no edema. Neurological: CN II-XII grossly intact with no focal deficits. Alert and oriented to person, place, and time. Coordination, balance, strength, speech and gait are normal.  Skin: Skin is warm and dry. No rash noted. No erythema.  Psychiatric: Patient has a normal mood and affect. Behavior is normal in office today. Judgment and thought content normal in office today.   Recent Results (from the past 2160 hour(s))  POCT glycosylated hemoglobin (Hb A1C)     Status: Abnormal   Collection Time: 05/08/15  8:52 AM  Result Value Ref Range   Hemoglobin A1C 7.9   POCT UA - Microalbumin     Status: Normal   Collection Time: 05/08/15  8:52 AM  Result Value Ref Range  Microalbumin Ur, POC NEGATIVE mg/L   Creatinine, POC  mg/dL   Albumin/Creatinine Ratio, Urine, POC     Diabetic Foot Exam - Simple   Simple Foot Form  Diabetic Foot exam was performed with the following findings:  Yes 05/08/2015  9:11 AM  Visual Inspection  No deformities, no ulcerations, no other skin breakdown bilaterally:  Yes  Sensation Testing  Intact to touch and monofilament testing bilaterally:  Yes  Pulse Check  Posterior Tibialis and Dorsalis pulse intact bilaterally:  Yes  Comments       Assessment & Plan  1. Type 2 diabetes, controlled, with renal manifestation Hba1c 7.9%, reasonable but we decided to increase Levemir from 7 units to 8 units.   Patient's Hba1c goal is <8% as this is reasonable for the elderly population who is at risk for hypoglycemic events.   Encouraged patient to continue efforts on checking blood glucose on a daily basis. Fasting blood glucose control goal is 80-167m/dL and post prandial blood  glucose control is <181mdL.  Reviewed diet, exercise, lifestyle changes and current medication regimen pertaining to diabetes with the patient.   Reminded patient of the required annual dilated retinal exam.   - Ambulatory referral to Ophthalmology  - POCT glycosylated hemoglobin (Hb A1C) - POCT UA - Microalbumin - Insulin Detemir (LEVEMIR) 100 UNIT/ML Pen; Inject 8 Units into the skin daily.  Dispense: 3 pen; Refill: 3  2. Need for immunization against influenza  - Flu Vaccine QUAD 36+ mos PF IM (Fluarix & Fluzone Quad PF)  3. Hypertension goal BP (blood pressure) < 140/90 Clinically stable findings based on clinical exam and on review of any pertinent results. Recommended to patient that they continue their current regimen with regular follow ups.   4. Hyperlipidemia LDL goal <100 Reviewed lab work from 08/2014 with patient. He is not willing to re-try statin therapy at this time.

## 2015-05-22 ENCOUNTER — Ambulatory Visit: Payer: Self-pay | Admitting: Family Medicine

## 2015-06-04 ENCOUNTER — Other Ambulatory Visit: Payer: Self-pay

## 2015-06-04 NOTE — Telephone Encounter (Signed)
Refill request was sent to Dr. Krichna Sowles for approval and submission.  

## 2015-06-05 MED ORDER — FENOFIBRATE 160 MG PO TABS: 160.0000 mg | ORAL_TABLET | Freq: Every day | ORAL | Status: DC

## 2015-06-05 MED ORDER — GLIMEPIRIDE 2 MG PO TABS: 2.0000 mg | ORAL_TABLET | Freq: Two times a day (BID) | ORAL | Status: DC

## 2015-06-14 ENCOUNTER — Other Ambulatory Visit: Payer: Self-pay

## 2015-06-14 NOTE — Telephone Encounter (Signed)
Got a fax from [place] requesting a refill on this patient's Finasteride 5mg .  Refill request was sent to Dr. [name] for approval and submission.

## 2015-06-15 MED ORDER — FINASTERIDE 5 MG PO TABS: 5.0000 mg | ORAL_TABLET | Freq: Every day | ORAL | Status: DC

## 2015-07-10 ENCOUNTER — Other Ambulatory Visit: Payer: Self-pay

## 2015-07-10 MED ORDER — AMLODIPINE BESYLATE 10 MG PO TABS: 10.0000 mg | ORAL_TABLET | Freq: Every day | ORAL | Status: DC

## 2015-07-10 NOTE — Telephone Encounter (Signed)
Got a fax from [facility] requesting a refill of this patient's Amlodipine 10mg .  Refill request was sent to Dr. [name] for approval and submission.

## 2015-07-18 ENCOUNTER — Inpatient Hospital Stay (HOSPITAL_COMMUNITY): Payer: Medicare Other

## 2015-07-18 ENCOUNTER — Encounter (HOSPITAL_COMMUNITY): Payer: Self-pay | Admitting: Emergency Medicine

## 2015-07-18 ENCOUNTER — Inpatient Hospital Stay (HOSPITAL_COMMUNITY)
Admission: EM | Admit: 2015-07-18 | Discharge: 2015-08-04 | DRG: 871 | Disposition: A | Discharge status: Home Health | Payer: Medicare Other | Attending: Internal Medicine | Admitting: Internal Medicine

## 2015-07-18 DIAGNOSIS — R140 Abdominal distension (gaseous): Secondary | ICD-10-CM | POA: Diagnosis not present

## 2015-07-18 DIAGNOSIS — D631 Anemia in chronic kidney disease: Secondary | ICD-10-CM | POA: Diagnosis present

## 2015-07-18 DIAGNOSIS — K567 Ileus, unspecified: Secondary | ICD-10-CM

## 2015-07-18 DIAGNOSIS — R0602 Shortness of breath: Secondary | ICD-10-CM

## 2015-07-18 DIAGNOSIS — M7989 Other specified soft tissue disorders: Secondary | ICD-10-CM | POA: Diagnosis not present

## 2015-07-18 DIAGNOSIS — R062 Wheezing: Secondary | ICD-10-CM | POA: Diagnosis present

## 2015-07-18 DIAGNOSIS — Z79899 Other long term (current) drug therapy: Secondary | ICD-10-CM

## 2015-07-18 DIAGNOSIS — Z888 Allergy status to other drugs, medicaments and biological substances status: Secondary | ICD-10-CM | POA: Diagnosis not present

## 2015-07-18 DIAGNOSIS — K5730 Diverticulosis of large intestine without perforation or abscess without bleeding: Secondary | ICD-10-CM | POA: Diagnosis present

## 2015-07-18 DIAGNOSIS — N170 Acute kidney failure with tubular necrosis: Secondary | ICD-10-CM | POA: Diagnosis present

## 2015-07-18 DIAGNOSIS — Z833 Family history of diabetes mellitus: Secondary | ICD-10-CM

## 2015-07-18 DIAGNOSIS — K920 Hematemesis: Secondary | ICD-10-CM

## 2015-07-18 DIAGNOSIS — Z794 Long term (current) use of insulin: Secondary | ICD-10-CM

## 2015-07-18 DIAGNOSIS — E785 Hyperlipidemia, unspecified: Secondary | ICD-10-CM | POA: Diagnosis not present

## 2015-07-18 DIAGNOSIS — K921 Melena: Secondary | ICD-10-CM | POA: Diagnosis not present

## 2015-07-18 DIAGNOSIS — R55 Syncope and collapse: Secondary | ICD-10-CM | POA: Diagnosis present

## 2015-07-18 DIAGNOSIS — E46 Unspecified protein-calorie malnutrition: Secondary | ICD-10-CM | POA: Diagnosis present

## 2015-07-18 DIAGNOSIS — A4150 Gram-negative sepsis, unspecified: Secondary | ICD-10-CM | POA: Diagnosis present

## 2015-07-18 DIAGNOSIS — E860 Dehydration: Secondary | ICD-10-CM | POA: Diagnosis present

## 2015-07-18 DIAGNOSIS — I5032 Chronic diastolic (congestive) heart failure: Secondary | ICD-10-CM | POA: Diagnosis present

## 2015-07-18 DIAGNOSIS — I10 Essential (primary) hypertension: Secondary | ICD-10-CM | POA: Diagnosis present

## 2015-07-18 DIAGNOSIS — N179 Acute kidney failure, unspecified: Secondary | ICD-10-CM | POA: Diagnosis not present

## 2015-07-18 DIAGNOSIS — E1129 Type 2 diabetes mellitus with other diabetic kidney complication: Secondary | ICD-10-CM

## 2015-07-18 DIAGNOSIS — R011 Cardiac murmur, unspecified: Secondary | ICD-10-CM | POA: Diagnosis not present

## 2015-07-18 DIAGNOSIS — R740 Nonspecific elevation of levels of transaminase and lactic acid dehydrogenase [LDH]: Secondary | ICD-10-CM | POA: Diagnosis present

## 2015-07-18 DIAGNOSIS — I959 Hypotension, unspecified: Secondary | ICD-10-CM

## 2015-07-18 DIAGNOSIS — Z7982 Long term (current) use of aspirin: Secondary | ICD-10-CM | POA: Diagnosis not present

## 2015-07-18 DIAGNOSIS — R351 Nocturia: Secondary | ICD-10-CM | POA: Diagnosis present

## 2015-07-18 DIAGNOSIS — Z91013 Allergy to seafood: Secondary | ICD-10-CM

## 2015-07-18 DIAGNOSIS — J90 Pleural effusion, not elsewhere classified: Secondary | ICD-10-CM

## 2015-07-18 DIAGNOSIS — I248 Other forms of acute ischemic heart disease: Secondary | ICD-10-CM | POA: Diagnosis present

## 2015-07-18 DIAGNOSIS — R7401 Elevation of levels of liver transaminase levels: Secondary | ICD-10-CM

## 2015-07-18 DIAGNOSIS — K580 Irritable bowel syndrome with diarrhea: Secondary | ICD-10-CM | POA: Diagnosis present

## 2015-07-18 DIAGNOSIS — K560 Paralytic ileus: Secondary | ICD-10-CM | POA: Diagnosis not present

## 2015-07-18 DIAGNOSIS — M1990 Unspecified osteoarthritis, unspecified site: Secondary | ICD-10-CM | POA: Diagnosis present

## 2015-07-18 DIAGNOSIS — N189 Chronic kidney disease, unspecified: Secondary | ICD-10-CM

## 2015-07-18 DIAGNOSIS — R7881 Bacteremia: Secondary | ICD-10-CM | POA: Insufficient documentation

## 2015-07-18 DIAGNOSIS — E876 Hypokalemia: Secondary | ICD-10-CM | POA: Diagnosis present

## 2015-07-18 DIAGNOSIS — IMO0001 Reserved for inherently not codable concepts without codable children: Secondary | ICD-10-CM | POA: Diagnosis present

## 2015-07-18 DIAGNOSIS — N183 Chronic kidney disease, stage 3 unspecified: Secondary | ICD-10-CM | POA: Diagnosis present

## 2015-07-18 DIAGNOSIS — N178 Other acute kidney failure: Secondary | ICD-10-CM

## 2015-07-18 DIAGNOSIS — D72829 Elevated white blood cell count, unspecified: Secondary | ICD-10-CM | POA: Diagnosis present

## 2015-07-18 DIAGNOSIS — D62 Acute posthemorrhagic anemia: Secondary | ICD-10-CM | POA: Diagnosis not present

## 2015-07-18 DIAGNOSIS — Z87891 Personal history of nicotine dependence: Secondary | ICD-10-CM | POA: Diagnosis not present

## 2015-07-18 DIAGNOSIS — K750 Abscess of liver: Secondary | ICD-10-CM | POA: Diagnosis present

## 2015-07-18 DIAGNOSIS — R0682 Tachypnea, not elsewhere classified: Secondary | ICD-10-CM | POA: Diagnosis present

## 2015-07-18 DIAGNOSIS — A419 Sepsis, unspecified organism: Secondary | ICD-10-CM

## 2015-07-18 DIAGNOSIS — Z87442 Personal history of urinary calculi: Secondary | ICD-10-CM | POA: Diagnosis not present

## 2015-07-18 DIAGNOSIS — E1122 Type 2 diabetes mellitus with diabetic chronic kidney disease: Secondary | ICD-10-CM | POA: Diagnosis present

## 2015-07-18 DIAGNOSIS — K581 Irritable bowel syndrome with constipation: Secondary | ICD-10-CM | POA: Diagnosis present

## 2015-07-18 DIAGNOSIS — E1165 Type 2 diabetes mellitus with hyperglycemia: Secondary | ICD-10-CM | POA: Diagnosis present

## 2015-07-18 DIAGNOSIS — E1121 Type 2 diabetes mellitus with diabetic nephropathy: Secondary | ICD-10-CM | POA: Diagnosis present

## 2015-07-18 DIAGNOSIS — R509 Fever, unspecified: Secondary | ICD-10-CM

## 2015-07-18 DIAGNOSIS — I509 Heart failure, unspecified: Secondary | ICD-10-CM

## 2015-07-18 DIAGNOSIS — E1022 Type 1 diabetes mellitus with diabetic chronic kidney disease: Secondary | ICD-10-CM | POA: Diagnosis not present

## 2015-07-18 DIAGNOSIS — I129 Hypertensive chronic kidney disease with stage 1 through stage 4 chronic kidney disease, or unspecified chronic kidney disease: Secondary | ICD-10-CM | POA: Diagnosis present

## 2015-07-18 DIAGNOSIS — Z09 Encounter for follow-up examination after completed treatment for conditions other than malignant neoplasm: Secondary | ICD-10-CM

## 2015-07-18 DIAGNOSIS — N1832 Chronic kidney disease, stage 3b: Secondary | ICD-10-CM | POA: Diagnosis present

## 2015-07-18 HISTORY — DX: Unspecified osteoarthritis, unspecified site: M19.90

## 2015-07-18 HISTORY — DX: Disorder of kidney and ureter, unspecified: N28.9

## 2015-07-18 LAB — CBG MONITORING, ED: GLUCOSE-CAPILLARY: 304 mg/dL — AB (ref 65–99)

## 2015-07-18 LAB — URINALYSIS, ROUTINE W REFLEX MICROSCOPIC
Glucose, UA: >1000 mg/dL — AB
Ketones, ur: 15 mg/dL — AB
LEUKOCYTES UA: NEGATIVE
Nitrite: NEGATIVE
PROTEIN: NEGATIVE mg/dL
SPECIFIC GRAVITY, URINE: 1.022 (ref 1.005–1.030)
pH: 5 (ref 5.0–8.0)

## 2015-07-18 LAB — CBC WITH DIFFERENTIAL/PLATELET
BASOS ABS: 0 10*3/uL (ref 0.0–0.1)
BASOS PCT: 0 %
EOS ABS: 0 10*3/uL (ref 0.0–0.7)
Eosinophils Relative: 0 %
HCT: 29.2 % — ABNORMAL LOW (ref 39.0–52.0)
Hemoglobin: 10 g/dL — ABNORMAL LOW (ref 13.0–17.0)
Lymphocytes Relative: 7 %
Lymphs Abs: 1.1 10*3/uL (ref 0.7–4.0)
MCH: 29.3 pg (ref 26.0–34.0)
MCHC: 34.2 g/dL (ref 30.0–36.0)
MCV: 85.6 fL (ref 78.0–100.0)
MONO ABS: 2.5 10*3/uL — AB (ref 0.1–1.0)
MONOS PCT: 16 %
Neutro Abs: 12.4 10*3/uL — ABNORMAL HIGH (ref 1.7–7.7)
Neutrophils Relative %: 77 %
PLATELETS: 237 10*3/uL (ref 150–400)
RBC: 3.41 MIL/uL — ABNORMAL LOW (ref 4.22–5.81)
RDW: 14 % (ref 11.5–15.5)
WBC: 16 10*3/uL — ABNORMAL HIGH (ref 4.0–10.5)

## 2015-07-18 LAB — URINE MICROSCOPIC-ADD ON

## 2015-07-18 LAB — INFLUENZA PANEL BY PCR (TYPE A & B)
H1N1FLUPCR: NOT DETECTED
INFLBPCR: NEGATIVE
Influenza A By PCR: NEGATIVE

## 2015-07-18 LAB — LACTIC ACID, PLASMA
Lactic Acid, Venous: 1 mmol/L (ref 0.5–2.0)
Lactic Acid, Venous: 1.6 mmol/L (ref 0.5–2.0)
Lactic Acid, Venous: 1.1 mmol/L (ref 0.5–2.0)
Lactic Acid, Venous: 1.4 mmol/L (ref 0.5–2.0)

## 2015-07-18 LAB — GLUCOSE, CAPILLARY
GLUCOSE-CAPILLARY: 255 mg/dL — AB (ref 65–99)
GLUCOSE-CAPILLARY: 179 mg/dL — AB (ref 65–99)

## 2015-07-18 LAB — COMPREHENSIVE METABOLIC PANEL
ALT: 67 U/L — AB (ref 17–63)
AST: 101 U/L — AB (ref 15–41)
Albumin: 3 g/dL — ABNORMAL LOW (ref 3.5–5.0)
Alkaline Phosphatase: 34 U/L — ABNORMAL LOW (ref 38–126)
Anion gap: 13 (ref 5–15)
BUN: 36 mg/dL — ABNORMAL HIGH (ref 6–20)
CHLORIDE: 102 mmol/L (ref 101–111)
CO2: 18 mmol/L — AB (ref 22–32)
CREATININE: 2.85 mg/dL — AB (ref 0.61–1.24)
Calcium: 8.9 mg/dL (ref 8.9–10.3)
GFR, EST AFRICAN AMERICAN: 23 mL/min — AB (ref >60)
GFR, EST NON AFRICAN AMERICAN: 20 mL/min — AB (ref >60)
Glucose, Bld: 291 mg/dL — ABNORMAL HIGH (ref 65–99)
POTASSIUM: 4 mmol/L (ref 3.5–5.1)
SODIUM: 133 mmol/L — AB (ref 135–145)
Total Bilirubin: 1.4 mg/dL — ABNORMAL HIGH (ref 0.3–1.2)
Total Protein: 7 g/dL (ref 6.5–8.1)

## 2015-07-18 LAB — TROPONIN I
TROPONIN I: 0.03 ng/mL (ref <0.031)
Troponin I: 0.12 ng/mL — ABNORMAL HIGH (ref <0.031)
Troponin I: 0.15 ng/mL — ABNORMAL HIGH (ref <0.031)

## 2015-07-18 LAB — POC OCCULT BLOOD, ED: FECAL OCCULT BLD: NEGATIVE

## 2015-07-18 LAB — TSH: TSH: 0.679 u[IU]/mL (ref 0.350–4.500)

## 2015-07-18 MED ORDER — TAMSULOSIN HCL 0.4 MG PO CAPS
0.4000 mg | ORAL_CAPSULE | Freq: Every day | ORAL | Status: DC
Start: 2015-07-18 — End: 2015-08-04
  Administered 2015-07-18 – 2015-08-04 (×16): 0.4 mg via ORAL
  Filled 2015-07-18 (×19): qty 1

## 2015-07-18 MED ORDER — ONDANSETRON HCL 4 MG PO TABS: 4.0000 mg | ORAL_TABLET | Freq: Four times a day (QID) | ORAL | Status: DC | PRN

## 2015-07-18 MED ORDER — LINAGLIPTIN 5 MG PO TABS
5.0000 mg | ORAL_TABLET | Freq: Every day | ORAL | Status: DC
  Administered 2015-07-19: 5 mg via ORAL
  Filled 2015-07-18: qty 1

## 2015-07-18 MED ORDER — ONDANSETRON HCL 4 MG/2ML IJ SOLN
4.0000 mg | Freq: Four times a day (QID) | INTRAMUSCULAR | Status: DC | PRN
  Administered 2015-07-24 – 2015-07-25 (×3): 4 mg via INTRAVENOUS
  Filled 2015-07-18 (×2): qty 2

## 2015-07-18 MED ORDER — INSULIN ASPART 100 UNIT/ML ~~LOC~~ SOLN: 0.0000 [IU] | Freq: Every day | SUBCUTANEOUS | Status: DC

## 2015-07-18 MED ORDER — DOCUSATE SODIUM 100 MG PO CAPS
100.0000 mg | ORAL_CAPSULE | Freq: Two times a day (BID) | ORAL | Status: DC
  Administered 2015-07-18: 100 mg via ORAL
  Filled 2015-07-18 (×5): qty 1

## 2015-07-18 MED ORDER — IBUPROFEN 400 MG PO TABS
400.0000 mg | ORAL_TABLET | Freq: Once | ORAL | Status: AC
  Administered 2015-07-18: 400 mg via ORAL
  Filled 2015-07-18: qty 1

## 2015-07-18 MED ORDER — LEVALBUTEROL HCL 0.63 MG/3ML IN NEBU
0.6300 mg | INHALATION_SOLUTION | RESPIRATORY_TRACT | Status: AC
  Administered 2015-07-18: 0.63 mg via RESPIRATORY_TRACT
  Filled 2015-07-18: qty 3

## 2015-07-18 MED ORDER — SODIUM CHLORIDE 0.9 % IV SOLN
Freq: Once | INTRAVENOUS | Status: AC
  Administered 2015-07-18: 09:00:00 via INTRAVENOUS

## 2015-07-18 MED ORDER — SODIUM CHLORIDE 0.9 % IJ SOLN
3.0000 mL | Freq: Two times a day (BID) | INTRAMUSCULAR | Status: DC
  Administered 2015-07-18 – 2015-08-03 (×17): 3 mL via INTRAVENOUS

## 2015-07-18 MED ORDER — INSULIN ASPART 100 UNIT/ML ~~LOC~~ SOLN: 0.0000 [IU] | Freq: Three times a day (TID) | SUBCUTANEOUS | Status: DC

## 2015-07-18 MED ORDER — INSULIN ASPART 100 UNIT/ML ~~LOC~~ SOLN
0.0000 [IU] | Freq: Three times a day (TID) | SUBCUTANEOUS | Status: DC
  Administered 2015-07-18: 7 [IU] via SUBCUTANEOUS
  Administered 2015-07-18: 5 [IU] via SUBCUTANEOUS
  Administered 2015-07-19 (×2): 3 [IU] via SUBCUTANEOUS

## 2015-07-18 MED ORDER — ACETAMINOPHEN 325 MG PO TABS
650.0000 mg | ORAL_TABLET | ORAL | Status: DC | PRN
  Administered 2015-07-18 – 2015-07-27 (×9): 650 mg via ORAL
  Filled 2015-07-18 (×9): qty 2

## 2015-07-18 MED ORDER — INSULIN DETEMIR 100 UNIT/ML ~~LOC~~ SOLN
8.0000 [IU] | Freq: Every day | SUBCUTANEOUS | Status: DC
  Administered 2015-07-18 – 2015-07-24 (×5): 8 [IU] via SUBCUTANEOUS
  Filled 2015-07-18 (×7): qty 0.08

## 2015-07-18 MED ORDER — SODIUM CHLORIDE 0.9 % IV BOLUS (SEPSIS)
500.0000 mL | Freq: Once | INTRAVENOUS | Status: AC
  Administered 2015-07-18: 500 mL via INTRAVENOUS

## 2015-07-18 MED ORDER — SODIUM CHLORIDE 0.9 % IV SOLN
INTRAVENOUS | Status: DC
  Administered 2015-07-18 (×3): via INTRAVENOUS

## 2015-07-18 MED ORDER — CEFTRIAXONE SODIUM 1 G IJ SOLR
1.0000 g | INTRAMUSCULAR | Status: DC
  Administered 2015-07-18: 1 g via INTRAVENOUS
  Filled 2015-07-18: qty 10

## 2015-07-18 MED ORDER — INSULIN ASPART 100 UNIT/ML ~~LOC~~ SOLN: 0.0000 [IU] | SUBCUTANEOUS | Status: DC

## 2015-07-18 MED ORDER — FINASTERIDE 5 MG PO TABS
5.0000 mg | ORAL_TABLET | Freq: Every day | ORAL | Status: DC
  Administered 2015-07-19 – 2015-08-04 (×16): 5 mg via ORAL
  Filled 2015-07-18 (×19): qty 1

## 2015-07-18 MED ORDER — GLIMEPIRIDE 2 MG PO TABS
2.0000 mg | ORAL_TABLET | Freq: Two times a day (BID) | ORAL | Status: DC
  Administered 2015-07-18 – 2015-07-19 (×2): 2 mg via ORAL
  Filled 2015-07-18 (×4): qty 1

## 2015-07-18 MED ORDER — INSULIN DETEMIR 100 UNIT/ML FLEXPEN: 8.0000 [IU] | PEN_INJECTOR | Freq: Every day | SUBCUTANEOUS | Status: DC

## 2015-07-18 MED ORDER — ENOXAPARIN SODIUM 30 MG/0.3ML ~~LOC~~ SOLN
30.0000 mg | SUBCUTANEOUS | Status: DC
  Administered 2015-07-18 – 2015-07-19 (×2): 30 mg via SUBCUTANEOUS
  Filled 2015-07-18 (×2): qty 0.3

## 2015-07-18 MED ORDER — ALUM & MAG HYDROXIDE-SIMETH 200-200-20 MG/5ML PO SUSP: 30.0000 mL | Freq: Four times a day (QID) | ORAL | Status: DC | PRN

## 2015-07-18 MED ORDER — VANCOMYCIN HCL IN DEXTROSE 750-5 MG/150ML-% IV SOLN
750.0000 mg | INTRAVENOUS | Status: DC
  Administered 2015-07-18 – 2015-07-19 (×2): 750 mg via INTRAVENOUS
  Filled 2015-07-18 (×5): qty 150

## 2015-07-18 MED ORDER — PIPERACILLIN-TAZOBACTAM 3.375 G IVPB
3.3750 g | Freq: Three times a day (TID) | INTRAVENOUS | Status: DC
  Administered 2015-07-18 – 2015-07-19 (×3): 3.375 g via INTRAVENOUS
  Filled 2015-07-18 (×5): qty 50

## 2015-07-18 NOTE — ED Notes (Signed)
Attempted report 

## 2015-07-18 NOTE — Progress Notes (Signed)
Orders given for chest xray, Ibuprofen, and xopenex breathing treatment. Carried orders as ordered. Will continue to monitor patient to end of shift. Wife at the bedside.

## 2015-07-18 NOTE — Progress Notes (Signed)
Since admission has spiked fever up to 103F. Initial chest x-ray unremarkable but will repeat in a.m. after hydration. Urinalysis pending. Influenza PCR has been requested as well.  Erin Hearing, ANP

## 2015-07-18 NOTE — Progress Notes (Signed)
ANTIBIOTIC CONSULT NOTE - INITIAL  Pharmacy Consult for Vancomycin, Zosyn Indication: rule out sepsis  Allergies  Allergen Reactions  . Niaspan [Niacin Er]   . Shellfish Allergy Other (See Comments)    Pt vomits  . Statins Other (See Comments)    Myalgia  . Zetia [Ezetimibe]     Patient Measurements: Height: 5\' 11"  (180.3 cm) Weight: 166 lb 7.2 oz (75.5 kg) (scale a) IBW/kg (Calculated) : 75.3 Adjusted Body Weight: n/a  Vital Signs: Temp: 100.8 F (38.2 C) (11/23 1819) Temp Source: Oral (11/23 1819) BP: 88/48 mmHg (11/23 1819) Pulse Rate: 103 (11/23 1819) Intake/Output from previous day:   Intake/Output from this shift: Total I/O In: 2018.3 [P.O.:830; I.V.:638.3; IV Piggyback:550] Out: 100 [Urine:100]  Labs:  Recent Labs  07/18/15 0830  WBC 16.0*  HGB 10.0*  PLT 237  CREATININE 2.85*   Estimated Creatinine Clearance: 22.4 mL/min (by C-G formula based on Cr of 2.85). No results for input(s): VANCOTROUGH, VANCOPEAK, VANCORANDOM, GENTTROUGH, GENTPEAK, GENTRANDOM, TOBRATROUGH, TOBRAPEAK, TOBRARND, AMIKACINPEAK, AMIKACINTROU, AMIKACIN in the last 72 hours.   Microbiology: Recent Results (from the past 720 hour(s))  Culture, blood (routine x 2)     Status: None (Preliminary result)   Collection Time: 07/18/15 12:50 PM  Result Value Ref Range Status   Specimen Description BLOOD RIGHT ANTECUBITAL  Final   Special Requests BOTTLES DRAWN AEROBIC AND ANAEROBIC 10CC  Final   Culture PENDING  Incomplete   Report Status PENDING  Incomplete    Medical History: Past Medical History  Diagnosis Date  . Type 2 diabetes mellitus ([place])   . Hypertension   . Hyperlipidemia   . Bladder stone   . History of bladder stone   . Nocturia   . Renal disorder   . Arthritis     Medications:  Scheduled:  . docusate sodium  100 mg Oral BID  . enoxaparin (LOVENOX) injection  30 mg Subcutaneous Q24H  . [START ON 07/19/2015] finasteride  5 mg Oral Daily  . glimepiride  2 mg Oral  BID AC  . insulin aspart  0-5 Units Subcutaneous QHS  . insulin aspart  0-9 Units Subcutaneous TID WC  . insulin detemir  8 Units Subcutaneous Daily  . [START ON 07/19/2015] linagliptin  5 mg Oral Daily  . sodium chloride  3 mL Intravenous Q12H  . tamsulosin  0.4 mg Oral Daily   Assessment: 81 yo male admitted with chills, dizziness, and elevated CBGs.  Pharmacy asked to begin empiric vancomycin and zosyn for leukocytosis, suspected sepsis.  Also with CKD and transaminitis.  Scr 2.85, est CrCl ~ 22 ml/min.  Goal of Therapy:  Vancomycin trough level 15-20 mcg/ml  Plan:  1. Zosyn 3.375g IV q 8 hrs (extended-interval - 4 hr infusion). 2. Vancomycin 750 mg IV q 24 hrs for now. 3. Monitor renal function closely and adjust doses as needed. 4. F/u cultures and clinical course.  Uvaldo Rising, BCPS  Clinical Pharmacist Pager [phone]  07/18/2015 6:49 PM

## 2015-07-18 NOTE — Progress Notes (Signed)
Temp 103.  MD notified.  Will admin Tylenol 650mg  PO.  Will recheck.

## 2015-07-18 NOTE — Progress Notes (Signed)
Paged on-call hospitalist -pt is now wheezing, and is shivering trying to get warm, temp is 101.4 but tylenol is not due. Vancomycin given. Awaiting page and or written orders.

## 2015-07-18 NOTE — H&P (Signed)
[facility] History and Physical                                                                                    Brian [name], is a 81 y.o. male  MRN: [id number]   DOB - 1934/04/10  Admit Date - 07/18/2015  Outpatient Primary [name] for the patient is Brian [name]  Referring [name]: Brian [name] / ER  With History of -  Past Medical History  Diagnosis Date  . Type 2 diabetes mellitus ([place])   . Hypertension   . Hyperlipidemia   . Bladder stone   . History of bladder stone   . Nocturia   . Renal disorder       Past Surgical History  Procedure Laterality Date  . Cystoscopy/retrograde/ureteroscopy/stone extraction with basket N/A 03/22/2013    Procedure: CYSTOSCOPY BLADDER STONE STONE EXTRACTION;  Surgeon: [name], [name];  Location: [facility];  Service: Urology;  Laterality: N/A;  CYSTOSCOPY      in for   Chief Complaint  Patient presents with  . Hyperglycemia     HPI This is a 81 year old male patient with past medical history of diabetes with associated chronic kidney disease related to diabetic nephropathy, dyslipidemia with statin intolerance, hypertension on multiple medications, history of bladder stones, as well as irritable bowel and history of bleeding diverticula remotely. Patient presented to the ER because of subjective sensation of chills and dizziness as well as EBG's persistently greater than 300. Patient tells me that a week ago Wednesday he went out to a basketball game at a local high school and started feeling chilled there but no associated fevers or other symptoms. He went out to eat. He ate oysters and got very sick and describes significant nausea and vomiting without diarrhea; again no fevers. Over the past several days his CBGs have been difficult to manage despite taking his normal diabetes medications and have been consistently greater than 300. He has not had any upper respiratory symptoms such as cough or difficulty  breathing, no urinary symptoms, no change in urinary output, no change in stools noting they are dark, no weight loss, he continues to report weakness and fatigue.  In the ER the patient was afebrile, mildly tachypnea with respiratory rates between 28 and 29 bpm, pulse was 94, blood pressure suboptimal for this patient at 100/56 with typical blood pressure readings between 503 and 888 systolic according to the patient's wife. He was not hypoxic and was maintaining room air saturations of 99%. Glucose was 304, sodium 133, BUN 36 with creatinine 2.85 with baseline renal function unknown. Albumin 3, AST 101 ALT 67, total protein 1.4, troponin normal, white count elevated at 16,000 neutrophils 77% and absolute neutrophils 12.4%, hemoglobin 10 with baseline unknown. BP checked fecal occult blood which was negative. EKG revealed sinus rhythm without any ischemic changes.   Review of Systems   In addition to the HPI above,  No Fever No Headache, changes with Vision or hearing, new weakness, tingling, numbness in any extremity, No problems swallowing food or Liquids, indigestion/reflux No Chest pain, Cough or Shortness of Breath, palpitations, orthopnea or DOE No Abdominal pain, no melena  or hematochezia, no dark tarry stools No dysuria, hematuria or flank pain No new skin rashes, lesions, masses or bruises, No new joints pains-aches No recent weight gain or loss No polyuria, polydypsia or polyphagia,  *A full 10 point Review of Systems was done, except as stated above, all other Review of Systems were negative.  Social History Social History  Substance Use Topics  . Smoking status: Former Smoker -- 0.75 packs/day for 39 years    Types: Cigarettes    Quit date: 03/21/1993  . Smokeless tobacco: Not on file  . Alcohol Use: No    Resides at: Private residence  Lives with: Wife  Ambulatory status: Without assistive devices   Family History History reviewed. Patient reports both parents with  history diabetes   Prior to Admission medications   Medication Sig Start Date End Date Taking? Authorizing Provider  amLODipine (NORVASC) 10 MG tablet Take 1 tablet (10 mg total) by mouth daily. 07/10/15  Yes Brian [name]  aspirin EC 81 MG tablet Take 81 mg by mouth daily.   Yes Historical Provider, [name]  carvedilol (COREG) 6.25 MG tablet Take 1 tablet by mouth 2 (two) times daily. 02/27/15  Yes Historical Provider, [name]  docusate sodium (COLACE) 100 MG capsule Take 1 capsule (100 mg total) by mouth 2 (two) times daily. 04/24/15  Yes Brian [name]  fenofibrate 160 MG tablet Take 1 tablet (160 mg total) by mouth daily. 06/05/15  Yes Brian [name]  finasteride (PROSCAR) 5 MG tablet Take 1 tablet (5 mg total) by mouth daily. 06/15/15  Yes [name], [name]  glimepiride (AMARYL) 2 MG tablet Take 1 tablet (2 mg total) by mouth 2 (two) times daily. 06/05/15  Yes Brian [name]  Insulin Detemir (LEVEMIR) 100 UNIT/ML Pen Inject 8 Units into the skin daily. 05/08/15  Yes Brian [name]  Multiple Vitamin (MULTIVITAMIN WITH MINERALS) TABS tablet Take 1 tablet by mouth daily.   Yes Historical Provider, [name]  quinapril (ACCUPRIL) 20 MG tablet Take 1 tablet (20 mg total) by mouth 2 (two) times daily. 02/14/15  Yes Brian [name]  sitaGLIPtin (JANUVIA) 25 MG tablet Take 1 tablet (25 mg total) by mouth daily. 05/03/15  Yes Brian [name]  Tamsulosin HCl (FLOMAX) 0.4 MG CAPS Take 0.4 mg by mouth daily.   Yes Historical Provider, [name]  Blood Glucose Monitoring Suppl (ONE TOUCH ULTRA 2) W/DEVICE KIT  04/24/15   Historical Provider, [name]  Cholecalciferol (VITAMIN D3) 1000 UNITS CAPS Take 1 capsule by mouth daily.    Historical Provider, [name]  diphenoxylate-atropine (LOMOTIL) 2.5-0.025 MG per tablet Take 1 tablet by mouth 4 (four) times daily as needed for diarrhea or loose stools. 04/24/15   Brian [name]  glucose blood test strip Use as instructed 03/28/15   Brian [name]  meloxicam  (MOBIC) 7.5 MG tablet Take 7.5 mg by mouth 2 (two) times daily.     Historical Provider, [name]    Allergies  Allergen Reactions  . Niaspan [Niacin Er]   . Shellfish Allergy Other (See Comments)    Pt vomits  . Statins Other (See Comments)    Myalgia  . Zetia [Ezetimibe]     Physical Exam  Vitals  Blood pressure 111/63, pulse 98, temperature 98.7 F (37.1 C), temperature source Oral, resp. rate 28, height 5' 11" (1.803 m), weight 175 lb (79.379 kg), SpO2 98 %.   General:  In no acute distress, appears healthy and well nourished  Psych:  Normal affect,  Denies Suicidal or Homicidal ideations, Awake Alert, Oriented X 3. Speech and thought patterns are clear and appropriate, no apparent short term memory deficits  Neuro:   No focal neurological deficits, CN II through XII intact, Strength 5/5 all 4 extremities, Sensation intact all 4 extremities.  ENT:  Ears and Eyes appear Normal, Conjunctivae clear, PER. Moist oral mucosa without erythema or exudates.  Neck:  Supple, No lymphadenopathy appreciated  Respiratory:  Symmetrical chest wall movement, Good air movement bilaterally, CTAB. Room Air  Cardiac:  RRR, No Murmurs, no LE edema noted, no JVD, No carotid bruits, peripheral pulses palpable at 2+, blood pressure suboptimal for this patient  Abdomen:  Positive bowel sounds, Soft, Non tender, Non distended,  No masses appreciated, no obvious hepatosplenomegaly  Skin:  No Cyanosis, Normal Skin Turgor, No Skin Rash or Bruise.  Extremities: Symmetrical without obvious trauma or injury,  no effusions.  Data Review  CBC  Recent Labs Lab 07/18/15 0830  WBC 16.0*  HGB 10.0*  HCT 29.2*  PLT 237  MCV 85.6  MCH 29.3  MCHC 34.2  RDW 14.0  LYMPHSABS 1.1  MONOABS 2.5*  EOSABS 0.0  BASOSABS 0.0    Chemistries   Recent Labs Lab 07/18/15 0830  NA 133*  K 4.0  CL 102  CO2 18*  GLUCOSE 291*  BUN 36*  CREATININE 2.85*  CALCIUM 8.9  AST 101*  ALT 67*  ALKPHOS 34*    BILITOT 1.4*    estimated creatinine clearance is 22.4 mL/min (by C-G formula based on Cr of 2.85).  No results for input(s): TSH, T4TOTAL, T3FREE, THYROIDAB in the last 72 hours.  Invalid input(s): FREET3  Coagulation profile No results for input(s): INR, PROTIME in the last 168 hours.  No results for input(s): DDIMER in the last 72 hours.  Cardiac Enzymes  Recent Labs Lab 07/18/15 0830  TROPONINI 0.03    Invalid input(s): POCBNP  Urinalysis    Component Value Date/Time   COLORURINE RED* 07/04/2012 1433   APPEARANCEUR TURBID* 07/04/2012 1433   LABSPEC 1.022 07/04/2012 1433   PHURINE 5.0 07/04/2012 1433   GLUCOSEU NEGATIVE 07/04/2012 1433   HGBUR LARGE* 07/04/2012 1433   BILIRUBINUR SMALL* 07/04/2012 1433   KETONESUR 15* 07/04/2012 1433   PROTEINUR 100* 07/04/2012 1433   UROBILINOGEN 0.2 07/04/2012 1433   NITRITE NEGATIVE 07/04/2012 1433   LEUKOCYTESUR SMALL* 07/04/2012 1433    Imaging results:   No results found.   EKG: (Independently reviewed) sinus rhythm with ventricular rate 99 bpm, QTC 418 ms, no ST or T-wave changes that would be concerning for ischemia, does have subtle J-point elevation in his inferior leads.   Assessment & Plan  Principal Problem:   Acute renal failure on Chronic kidney disease (CKD), stage III (moderate) -Telemetry -Baseline renal function/serum creatinine unknown but documented as stage III -Gentle IV fluid hydration with normal saline at 100/hr since ejection fraction unknown -Has associated mild metabolic acidemia as evidenced by CO2 18 on electrolyte panel -Labs in a.m. -Suspect dehydration in setting of recent nausea /vomiting contributed to hypoperfusion may have influenced acute renal failure -Holding preadmission ACE inhibitor and meloxicam  Active Problems:   Transaminitis -Etiology unclear noting patient completely asymptomatic on exam -Patient does report significant nausea and vomiting without fever after eating  oysters one week ago so need to rule out acute hepatitis -Repeat labs in a.m. -No abdominal pain on exam but for completeness of workup we'll check abdominal ultrasound to rule out biliary etiology such as cholelithiasis -  Elevated LFTs could also be a result of hypoperfusion as described above    Diabetes mellitus, insulin dependent (IDDM), uncontrolled (HCC) -CBGs up in poorly controlled times about one week despite taking typical medications -Given leukocytosis potentially could be infectious mediated-see workup below -Continue preadmission Amaryl, substitute Tradjenta for Januvia, continue Levemir -Check CBGs and provide SSI -Hemoglobin A1c -Anion gap normal    Leukocytosis -Etiology unclear but will rule out infectious causes by checking urinalysis and two-view chest x-ray -Could also be more consistent with inflammatory markers in setting of hyperglycemia and recent likely dehydration from upper GI illness/possible food poisoning -For completeness of evaluation check lactic acid level    Hypertension goal BP (blood pressure) < 140/90 -Current blood pressure suboptimal in setting of presumed dehydration and relative hypotension -Hold preadmission Norvasc, Carvedilol, Accupril -IV fluids as above -Treat hyperglycemia     Anemia of chronic renal failure -Baseline hemoglobin unknown -FOB in ER negative -Patient reports recently stopped taking iron due to constipation    Hyperlipidemia LDL goal <100 -Statin intolerance secondary to myalgias -Resume fenofibrate once diet resumed-currently NPO for abdominal ultrasound    DVT Prophylaxis: Lovenox-dose adjusted for GFR less than 30  Family Communication:   Spouse at bedside  Code Status:  Full code  Condition:  Stable  Discharge disposition: Anticipate discharge back to home environment once medically stable likely within the next 48-72 hours  Time spent in minutes : 60      ELLIS,ALLISON L. ANP on 07/18/2015 at 10:44  AM  Between 7am to 7pm - Pager - [phone]  After 7pm go to [email or web address] - password [email or web address]  And look for the night coverage person covering me after hours  [facility]

## 2015-07-18 NOTE — ED Notes (Signed)
Pt states his blood sugar has been in the 200's for 4 days along with feeling tired and chilled.

## 2015-07-18 NOTE — Progress Notes (Signed)
Droplet precautions were originally placed when pt arrived on 3E unit, as he had fever, weakness, flu-like symptoms.  Flu swabs were sent to lab and results negative.  Orders were placed to remove droplet precautions.

## 2015-07-18 NOTE — ED Notes (Signed)
Off unit with xray. 

## 2015-07-18 NOTE — ED Provider Notes (Signed)
CSN: [id number]     Arrival date & time 07/18/15  0804 History   First MD Initiated Contact with Patient 07/18/15 [phone]     Chief Complaint  Patient presents with  . Hyperglycemia      HPI  Patient presents for evaluation of feeling chilled, dizzy, and high blood sugars.  Patient relates symptoms to a basket while game he attended 5 days ago. He felt cold. He states he has felt cold since that time. Normally his blood sugars run about 140. He states since that time they have been between 200-300 without change in medications or diet. He's been weak and lightheaded for the last 2 days. He presents here.  He denies chest pain shortness of breath. He states his stools are always dark. He takes iron. He has not noticed a change. He has had a screening colonoscopy and has had bleeding from diverticuli in the past.  Wife states he has "class III kidney disease". He continues to make urine. Past Medical History  Diagnosis Date  . Type 2 diabetes mellitus ([place])   . Hypertension   . Hyperlipidemia   . Bladder stone   . History of bladder stone   . Nocturia   . Renal disorder    Past Surgical History  Procedure Laterality Date  . Cystoscopy/retrograde/ureteroscopy/stone extraction with basket N/A 03/22/2013    Procedure: CYSTOSCOPY BLADDER STONE STONE EXTRACTION;  Surgeon: [name], MD;  Location: [facility];  Service: Urology;  Laterality: N/A;  CYSTOSCOPY     History reviewed. No pertinent family history. Social History  Substance Use Topics  . Smoking status: Former Smoker -- 0.75 packs/day for 39 years    Types: Cigarettes    Quit date: 03/21/1993  . Smokeless tobacco: None  . Alcohol Use: No    Review of Systems  Constitutional: Positive for chills and fatigue. Negative for fever, diaphoresis and appetite change.  HENT: Negative for mouth sores, sore throat and trouble swallowing.   Eyes: Negative for visual disturbance.  Respiratory: Negative for  cough, chest tightness, shortness of breath and wheezing.   Cardiovascular: Negative for chest pain.  Gastrointestinal: Negative for nausea, vomiting, abdominal pain, diarrhea and abdominal distention.  Endocrine: Positive for cold intolerance and polyuria. Negative for polydipsia and polyphagia.  Genitourinary: Negative for dysuria, frequency and hematuria.  Musculoskeletal: Negative for gait problem.  Skin: Negative for color change, pallor and rash.  Neurological: Positive for light-headedness. Negative for dizziness, syncope and headaches.  Hematological: Does not bruise/bleed easily.  Psychiatric/Behavioral: Negative for behavioral problems and confusion.      Allergies  Niaspan; Shellfish allergy; Statins; and Zetia  Home Medications   Prior to Admission medications   Medication Sig Start Date End Date Taking? Authorizing Provider  amLODipine (NORVASC) 10 MG tablet Take 1 tablet (10 mg total) by mouth daily. 07/10/15  Yes [name], MD  aspirin EC 81 MG tablet Take 81 mg by mouth daily.   Yes Historical Provider, MD  carvedilol (COREG) 6.25 MG tablet Take 1 tablet by mouth 2 (two) times daily. 02/27/15  Yes Historical Provider, MD  docusate sodium (COLACE) 100 MG capsule Take 1 capsule (100 mg total) by mouth 2 (two) times daily. 04/24/15  Yes [name], MD  fenofibrate 160 MG tablet Take 1 tablet (160 mg total) by mouth daily. 06/05/15  Yes [name], MD  finasteride (PROSCAR) 5 MG tablet Take 1 tablet (5 mg total) by mouth daily. 06/15/15  Yes [name], MD  glimepiride (  AMARYL) 2 MG tablet Take 1 tablet (2 mg total) by mouth 2 (two) times daily. 06/05/15  Yes [name], MD  Insulin Detemir (LEVEMIR) 100 UNIT/ML Pen Inject 8 Units into the skin daily. 05/08/15  Yes [name], MD  Multiple Vitamin (MULTIVITAMIN WITH MINERALS) TABS tablet Take 1 tablet by mouth daily.   Yes Historical Provider, MD  quinapril (ACCUPRIL) 20 MG tablet Take 1 tablet (20  mg total) by mouth 2 (two) times daily. 02/14/15  Yes [name], MD  sitaGLIPtin (JANUVIA) 25 MG tablet Take 1 tablet (25 mg total) by mouth daily. 05/03/15  Yes [name], MD  Tamsulosin HCl (FLOMAX) 0.4 MG CAPS Take 0.4 mg by mouth daily.   Yes Historical Provider, MD  Blood Glucose Monitoring Suppl (ONE TOUCH ULTRA 2) W/DEVICE KIT  04/24/15   Historical Provider, MD  Cholecalciferol (VITAMIN D3) 1000 UNITS CAPS Take 1 capsule by mouth daily.    Historical Provider, MD  diphenoxylate-atropine (LOMOTIL) 2.5-0.025 MG per tablet Take 1 tablet by mouth 4 (four) times daily as needed for diarrhea or loose stools. 04/24/15   [name], MD  glucose blood test strip Use as instructed 03/28/15   [name], MD  meloxicam (MOBIC) 7.5 MG tablet Take 7.5 mg by mouth 2 (two) times daily.     Historical Provider, MD   BP 100/56 mmHg  Pulse 94  Temp(Src) 98.7 F (37.1 C) (Oral)  Resp 29  Ht $R'5\' 11"'NH$  (1.803 m)  Wt 175 lb (79.379 kg)  BMI 24.42 kg/m2  SpO2 99% Physical Exam  Constitutional: He is oriented to person, place, and time. He appears well-developed and well-nourished. No distress.  HENT:  Head: Normocephalic.  Eyes: Conjunctivae are normal. Pupils are equal, round, and reactive to light. No scleral icterus.  Conjunctiva appear quite pale.  Neck: Normal range of motion. Neck supple. No thyromegaly present.  Cardiovascular: Normal rate and regular rhythm.  Exam reveals no gallop and no friction rub.   No murmur heard. Pulmonary/Chest: Effort normal and breath sounds normal. No respiratory distress. He has no wheezes. He has no rales.  Abdominal: Soft. Bowel sounds are normal. He exhibits no distension. There is no tenderness. There is no rebound.  Rectal exam dark stool. Sent for occult testing.  Musculoskeletal: Normal range of motion.  Neurological: He is alert and oriented to person, place, and time.  Skin: Skin is warm and dry. No rash noted.  Psychiatric: He has a normal  mood and affect. His behavior is normal.    ED Course  Procedures (including critical care time) Labs Review Labs Reviewed  CBC WITH DIFFERENTIAL/PLATELET - Abnormal; Notable for the following:    WBC 16.0 (*)    RBC 3.41 (*)    Hemoglobin 10.0 (*)    HCT 29.2 (*)    Neutro Abs 12.4 (*)    Monocytes Absolute 2.5 (*)    All other components within normal limits  COMPREHENSIVE METABOLIC PANEL - Abnormal; Notable for the following:    Sodium 133 (*)    CO2 18 (*)    Glucose, Bld 291 (*)    BUN 36 (*)    Creatinine, Ser 2.85 (*)    Albumin 3.0 (*)    AST 101 (*)    ALT 67 (*)    Alkaline Phosphatase 34 (*)    Total Bilirubin 1.4 (*)    GFR calc non Af Amer 20 (*)    GFR calc Af Amer 23 (*)    All  other components within normal limits  CBG MONITORING, ED - Abnormal; Notable for the following:    Glucose-Capillary 304 (*)    All other components within normal limits  TROPONIN I  URINALYSIS, ROUTINE W REFLEX MICROSCOPIC (NOT AT [facility])  TSH  POC OCCULT BLOOD, ED    Imaging Review No results found. I have personally reviewed and evaluated these images and lab results as part of my medical decision-making.   EKG Interpretation   Date/Time:  Wednesday July 18 2015 08:37:36 EST Ventricular Rate:  99 PR Interval:  168 QRS Duration: 85 QT Interval:  326 QTC Calculation: 418 R Axis:   66 Text Interpretation:  Sinus rhythm Confirmed by [name]  MD, [place] ([id number]) on  07/18/2015 8:43:26 AM      MDM   Final diagnoses:  Acute kidney injury ([place])  Hypotension, unspecified hypotension type     Patient with WBC of 16.0  normal troponin. Unchanged EKG. He has no abdominal pain or complaints. He is guaiac negative. TSH pending. Slight elevation of AST, LT, alkaline phosphatase, and bili. No comparisons.  His blood pressure has improved and is now greater than 114 systolic. He has an acute renal insufficiency with creatinine of 2.8. Baseline of 1.4.  For primary care, he  goes outpatient to [facility]. I placed a call hospitalist to discuss disposition.   Tanna Furry, MD 07/18/15 1003

## 2015-07-18 NOTE — Progress Notes (Signed)
Rechecked temp after tylenol 650 mg given.  Temp now 100.8, however, BP dropped to 88/48, HR 103.  Dr. Tyrell Antonio ordered to give another Tylenol 650 mg dose, and give NS 1L bolus.  Will continue to monitor.

## 2015-07-19 ENCOUNTER — Inpatient Hospital Stay (HOSPITAL_COMMUNITY): Payer: Medicare Other

## 2015-07-19 ENCOUNTER — Encounter (HOSPITAL_COMMUNITY): Payer: Self-pay | Admitting: Physician Assistant

## 2015-07-19 DIAGNOSIS — I248 Other forms of acute ischemic heart disease: Secondary | ICD-10-CM

## 2015-07-19 DIAGNOSIS — I9589 Other hypotension: Secondary | ICD-10-CM

## 2015-07-19 DIAGNOSIS — N183 Chronic kidney disease, stage 3 (moderate): Secondary | ICD-10-CM

## 2015-07-19 DIAGNOSIS — A419 Sepsis, unspecified organism: Secondary | ICD-10-CM | POA: Diagnosis present

## 2015-07-19 DIAGNOSIS — E785 Hyperlipidemia, unspecified: Secondary | ICD-10-CM

## 2015-07-19 DIAGNOSIS — R740 Nonspecific elevation of levels of transaminase and lactic acid dehydrogenase [LDH]: Secondary | ICD-10-CM

## 2015-07-19 DIAGNOSIS — D72829 Elevated white blood cell count, unspecified: Secondary | ICD-10-CM

## 2015-07-19 DIAGNOSIS — D631 Anemia in chronic kidney disease: Secondary | ICD-10-CM

## 2015-07-19 DIAGNOSIS — N170 Acute kidney failure with tubular necrosis: Secondary | ICD-10-CM

## 2015-07-19 DIAGNOSIS — N179 Acute kidney failure, unspecified: Secondary | ICD-10-CM

## 2015-07-19 DIAGNOSIS — N189 Chronic kidney disease, unspecified: Secondary | ICD-10-CM

## 2015-07-19 LAB — COMPREHENSIVE METABOLIC PANEL
ALBUMIN: 2.4 g/dL — AB (ref 3.5–5.0)
ALT: 90 U/L — ABNORMAL HIGH (ref 17–63)
ALT: 150 U/L — ABNORMAL HIGH (ref 17–63)
ANION GAP: 9 (ref 5–15)
ANION GAP: 9 (ref 5–15)
AST: 125 U/L — ABNORMAL HIGH (ref 15–41)
AST: 211 U/L — ABNORMAL HIGH (ref 15–41)
Albumin: 2.6 g/dL — ABNORMAL LOW (ref 3.5–5.0)
Alkaline Phosphatase: 51 U/L (ref 38–126)
Alkaline Phosphatase: 79 U/L (ref 38–126)
BILIRUBIN TOTAL: 1.1 mg/dL (ref 0.3–1.2)
BUN: 39 mg/dL — ABNORMAL HIGH (ref 6–20)
BUN: 41 mg/dL — ABNORMAL HIGH (ref 6–20)
CHLORIDE: 109 mmol/L (ref 101–111)
CHLORIDE: 107 mmol/L (ref 101–111)
CO2: 19 mmol/L — ABNORMAL LOW (ref 22–32)
CO2: 21 mmol/L — ABNORMAL LOW (ref 22–32)
Calcium: 8.3 mg/dL — ABNORMAL LOW (ref 8.9–10.3)
Calcium: 8.4 mg/dL — ABNORMAL LOW (ref 8.9–10.3)
Creatinine, Ser: 2.36 mg/dL — ABNORMAL HIGH (ref 0.61–1.24)
Creatinine, Ser: 2.24 mg/dL — ABNORMAL HIGH (ref 0.61–1.24)
GFR calc Af Amer: 28 mL/min — ABNORMAL LOW (ref >60)
GFR, EST AFRICAN AMERICAN: 30 mL/min — AB (ref >60)
GFR, EST NON AFRICAN AMERICAN: 26 mL/min — AB (ref >60)
GFR, EST NON AFRICAN AMERICAN: 25 mL/min — AB (ref >60)
Glucose, Bld: 195 mg/dL — ABNORMAL HIGH (ref 65–99)
Glucose, Bld: 161 mg/dL — ABNORMAL HIGH (ref 65–99)
POTASSIUM: 4.1 mmol/L (ref 3.5–5.1)
POTASSIUM: 3.5 mmol/L (ref 3.5–5.1)
Sodium: 135 mmol/L (ref 135–145)
Sodium: 139 mmol/L (ref 135–145)
TOTAL PROTEIN: 5.7 g/dL — AB (ref 6.5–8.1)
Total Bilirubin: 0.8 mg/dL (ref 0.3–1.2)
Total Protein: 6.7 g/dL (ref 6.5–8.1)

## 2015-07-19 LAB — URINE CULTURE

## 2015-07-19 LAB — CBC
HCT: 29.2 % — ABNORMAL LOW (ref 39.0–52.0)
HEMATOCRIT: 25.3 % — AB (ref 39.0–52.0)
HEMOGLOBIN: 8.8 g/dL — AB (ref 13.0–17.0)
Hemoglobin: 10.1 g/dL — ABNORMAL LOW (ref 13.0–17.0)
MCH: 29.1 pg (ref 26.0–34.0)
MCH: 29.1 pg (ref 26.0–34.0)
MCHC: 34.6 g/dL (ref 30.0–36.0)
MCHC: 34.8 g/dL (ref 30.0–36.0)
MCV: 83.8 fL (ref 78.0–100.0)
MCV: 84.1 fL (ref 78.0–100.0)
PLATELETS: 226 10*3/uL (ref 150–400)
Platelets: 194 10*3/uL (ref 150–400)
RBC: 3.02 MIL/uL — ABNORMAL LOW (ref 4.22–5.81)
RBC: 3.47 MIL/uL — ABNORMAL LOW (ref 4.22–5.81)
RDW: 13.8 % (ref 11.5–15.5)
RDW: 13.9 % (ref 11.5–15.5)
WBC: 16.6 10*3/uL — AB (ref 4.0–10.5)
WBC: 11.2 10*3/uL — AB (ref 4.0–10.5)

## 2015-07-19 LAB — GLUCOSE, CAPILLARY
GLUCOSE-CAPILLARY: 207 mg/dL — AB (ref 65–99)
GLUCOSE-CAPILLARY: 136 mg/dL — AB (ref 65–99)
Glucose-Capillary: 221 mg/dL — ABNORMAL HIGH (ref 65–99)
Glucose-Capillary: 70 mg/dL (ref 65–99)

## 2015-07-19 LAB — HEMOGLOBIN A1C
HEMOGLOBIN A1C: 8.1 % — AB (ref 4.8–5.6)
Mean Plasma Glucose: 186 mg/dL

## 2015-07-19 LAB — PROTIME-INR
INR: 1.51 — AB (ref 0.00–1.49)
PROTHROMBIN TIME: 18.3 s — AB (ref 11.6–15.2)

## 2015-07-19 LAB — TROPONIN I
TROPONIN I: 0.3 ng/mL — AB (ref <0.031)
TROPONIN I: 0.23 ng/mL — AB (ref <0.031)
Troponin I: 1.08 ng/mL (ref <0.031)

## 2015-07-19 LAB — HEPATITIS PANEL, ACUTE
HCV Ab: <0.1 s/co ratio (ref 0.0–0.9)
HEP B C IGM: NEGATIVE
HEP B S AG: NEGATIVE
Hep A IgM: NEGATIVE

## 2015-07-19 LAB — APTT: APTT: 40 s — AB (ref 24–37)

## 2015-07-19 LAB — PROCALCITONIN: Procalcitonin: 29.52 ng/mL

## 2015-07-19 LAB — LACTIC ACID, PLASMA: LACTIC ACID, VENOUS: 1.8 mmol/L (ref 0.5–2.0)

## 2015-07-19 LAB — MRSA PCR SCREENING: MRSA BY PCR: NEGATIVE

## 2015-07-19 MED ORDER — SODIUM CHLORIDE 0.9 % IV BOLUS (SEPSIS)
1000.0000 mL | Freq: Once | INTRAVENOUS | Status: AC
  Administered 2015-07-19: 1000 mL via INTRAVENOUS

## 2015-07-19 MED ORDER — DOXYCYCLINE HYCLATE 100 MG IV SOLR
100.0000 mg | Freq: Two times a day (BID) | INTRAVENOUS | Status: DC
  Administered 2015-07-19 – 2015-07-22 (×6): 100 mg via INTRAVENOUS
  Filled 2015-07-19 (×7): qty 100

## 2015-07-19 MED ORDER — SODIUM CHLORIDE 0.9 % IV BOLUS (SEPSIS): 500.0000 mL | Freq: Once | INTRAVENOUS | Status: DC

## 2015-07-19 MED ORDER — INSULIN ASPART 100 UNIT/ML ~~LOC~~ SOLN
0.0000 [IU] | SUBCUTANEOUS | Status: DC
  Administered 2015-07-19: 3 [IU] via SUBCUTANEOUS
  Administered 2015-07-20 (×2): 2 [IU] via SUBCUTANEOUS
  Administered 2015-07-20 – 2015-07-21 (×5): 3 [IU] via SUBCUTANEOUS

## 2015-07-19 MED ORDER — ASPIRIN 81 MG PO CHEW
81.0000 mg | CHEWABLE_TABLET | Freq: Every day | ORAL | Status: DC
Start: 2015-07-19 — End: 2015-07-30
  Administered 2015-07-19 – 2015-07-29 (×11): 81 mg via ORAL
  Filled 2015-07-19 (×12): qty 1

## 2015-07-19 MED ORDER — DEXTROSE 5 % IV SOLN
1.0000 g | INTRAVENOUS | Status: DC
  Administered 2015-07-19 – 2015-07-20 (×2): 1 g via INTRAVENOUS
  Filled 2015-07-19 (×2): qty 10

## 2015-07-19 MED ORDER — LEVALBUTEROL HCL 0.63 MG/3ML IN NEBU
0.6300 mg | INHALATION_SOLUTION | Freq: Four times a day (QID) | RESPIRATORY_TRACT | Status: DC | PRN
  Administered 2015-07-19 – 2015-07-22 (×10): 0.63 mg via RESPIRATORY_TRACT
  Filled 2015-07-19 (×12): qty 3

## 2015-07-19 MED ORDER — SODIUM CHLORIDE 0.9 % IV SOLN
INTRAVENOUS | Status: DC
  Administered 2015-07-19 – 2015-07-21 (×4): via INTRAVENOUS
  Administered 2015-07-21: 1000 mL via INTRAVENOUS
  Administered 2015-07-21: 03:00:00 via INTRAVENOUS

## 2015-07-19 MED ORDER — ALBUTEROL SULFATE (2.5 MG/3ML) 0.083% IN NEBU: 2.5000 mg | INHALATION_SOLUTION | Freq: Four times a day (QID) | RESPIRATORY_TRACT | Status: DC

## 2015-07-19 MED ORDER — ALBUTEROL SULFATE (2.5 MG/3ML) 0.083% IN NEBU
2.5000 mg | INHALATION_SOLUTION | RESPIRATORY_TRACT | Status: DC | PRN
  Administered 2015-07-19: 2.5 mg via RESPIRATORY_TRACT
  Filled 2015-07-19: qty 3

## 2015-07-19 NOTE — Progress Notes (Signed)
[facility] PROGRESS NOTE  Brian [name] [id number] DOB: 03-31-1934 DOA: 07/18/2015 PCP: [name], MD  Assessment/Plan: 1. Severe sepsis -Present on admission, evidenced by a temperature of 103, heart rate of 123, acute kidney injury, hypotension with systolic blood pressures in the 80s to 90s (having a history of hypertension), with elevated transaminases. -His source of infection is unclear, as workup thus far has included abdominal ultrasound, chest x-ray, urinalysis which was non-revealing. -He had reported eating oysters earlier this week after which he developed multiple episodes of nausea and vomiting that was associated with diarrhea. This history makes Vibrio parahaemolyticus a concern, particularly since this organism can cause septicemia.  -Will provide empiric coverage for Vibrio parahaemolyticus with doxycycline 100 mg IV twice a day and ceftriaxone 1 g IV every 24 hours until blood culture data is available. -Meanwhile will further workup with CT scan of abdomen and pelvis to assess for other intra-abdominal etiologies particularly in setting of elevated transaminases.    2.  Acute on chronic renal failure -Likely secondary to ATN in setting of hypotension or perhaps sepsis/critical illness -Previous labs performed earlier this year revealed creatinine between 1.4 1.7, initial lab work during this hospitalization revealed creatinine of 2.85 with BUN of 36.this improved with IV fluid administration, currently coming down to 2.36 on a.m. Lab work. -Plan to continue to monitor kidney function.  3.  Elevated transaminases -Lab work showing AST of 125 with ALT of 9.0. His total bilirubin was 1.1 and alkaline phosphatase 51. Abdominal ultrasound was unrevealing, plan to further investigate with CT scan of abdomen and pelvis without IV contrast  4.  Elevated troponin. -Lab work showing upward trend in his troponin to 0.30 -He presently denies chest pain. -I suspect  related to demand ischemia in setting of critical illness -Will continue monitoring troponin levels.  5. Diabetes mellitus -Will perform Accu-Cheks every 4 hours with sliding scale coverage, continue Levemir -He presented with hyperglycemia likely caused by infection  -Stopping oral hypoglycemic medications, as patient presents with acute kidney injury.   6.  History of hypertension. -He presented with hypotension for which antihypertensive medications were discontinued  7.  DVT prophylaxis. Lovenox  Code Status: full code Family Communication: spoke to his wife updated her on patient's condition Disposition Plan: patient appears critically ill, will transfer to stepdown unit for closer monitoring   Antibiotics:  Vancomycin started on 07/18/2015  Zosyn discontinued on 07/19/2015  Ceftriaxone started on 07/19/2015  Doxycycline started on 07/19/2015  HPI/Subjective: Brian [name] is a 81 year old gentleman with a past medical history of diabetes mellitus, admitted to the medicine service on 07/18/2015 when he presented with complaints of generalized weakness, dizziness, chills that was social with hyperglycemia. He reports having several episodes of nausea and vomiting earlier this week after having oysters. Initial lab work revealed acute on chronic kidney injury having creatinine of 2.85 with BUN of 36. He was found to be febrile having a temperature of 103 and started on broad-spectrum IV antimicrobial therapy with vancomycin and Zosyn. Patient also remained hypotensive overnight with systolic blood pressures in the 80s to 90s.workup revealed an abdominal ultrasoundwhich did not reveal acute intra-abdominal pathology. Lab work did reveal elevated transaminases with AST of 125 and ALT of 90.other workup included a chest x-ray and urinalysis which was unremarkable. Patient was transferred to the stepdown unit on 07/19/2015 for closer monitoring.  Objective: Filed Vitals:   07/19/15 0515  07/19/15 0817  BP: 86/59 92/55  Pulse: 63 99  Temp: 98.4 F (  36.9 C) 97.8 F (36.6 C)  Resp: 18 18    Intake/Output Summary (Last 24 hours) at 07/19/15 1401 Last data filed at 07/19/15 1200  Gross per 24 hour  Intake 6060.1 ml  Output    501 ml  Net 5559.1 ml   Filed Weights   07/18/15 0833 07/18/15 1223 07/19/15 0515  Weight: 79.379 kg (175 lb) 75.5 kg (166 lb 7.2 oz) 76.114 kg (167 lb 12.8 oz)    Exam:   General:  Toxic appearing, actively having rigors, appears mildly confused  Cardiovascular: tachycardic, regular rate and rhythm normal S1-S2  Respiratory: patient having bilateral expiratory wheezes, normal respiratory effort  Abdomen: on abdominal exam he did not have significant pain with palpation across generalized abdomen, bowel sounds were present, overall nonfocal.  Musculoskeletal: no edema  Data Reviewed: Basic Metabolic Panel:  Recent Labs Lab 07/18/15 0830 07/19/15 0025  NA 133* 135  K 4.0 3.5  CL 102 107  CO2 18* 19*  GLUCOSE 291* 195*  BUN 36* 39*  CREATININE 2.85* 2.36*  CALCIUM 8.9 8.3*   Liver Function Tests:  Recent Labs Lab 07/18/15 0830 07/19/15 0025  AST 101* 125*  ALT 67* 90*  ALKPHOS 34* 51  BILITOT 1.4* 1.1  PROT 7.0 5.7*  ALBUMIN 3.0* 2.4*   No results for input(s): LIPASE, AMYLASE in the last 168 hours. No results for input(s): AMMONIA in the last 168 hours. CBC:  Recent Labs Lab 07/18/15 0830 07/19/15 0025  WBC 16.0* 11.2*  NEUTROABS 12.4*  --   HGB 10.0* 8.8*  HCT 29.2* 25.3*  MCV 85.6 83.8  PLT 237 194   Cardiac Enzymes:  Recent Labs Lab 07/18/15 0830 07/18/15 1250 07/18/15 1818 07/19/15 0025  TROPONINI 0.03 0.12* 0.15* 0.30*   BNP (last 3 results) No results for input(s): BNP in the last 8760 hours.  ProBNP (last 3 results) No results for input(s): PROBNP in the last 8760 hours.  CBG:  Recent Labs Lab 07/18/15 0816 07/18/15 1633 07/18/15 2140 07/19/15 0633 07/19/15 1126  GLUCAP 304*  255* 179* 221* 207*    Recent Results (from the past 240 hour(s))  Urine culture     Status: None   Collection Time: 07/18/15 12:03 PM  Result Value Ref Range Status   Specimen Description URINE, CLEAN CATCH  Final   Special Requests NONE  Final   Culture MULTIPLE SPECIES PRESENT, SUGGEST RECOLLECTION  Final   Report Status 07/19/2015 FINAL  Final  Culture, blood (routine x 2)     Status: None (Preliminary result)   Collection Time: 07/18/15 12:50 PM  Result Value Ref Range Status   Specimen Description BLOOD RIGHT ANTECUBITAL  Final   Special Requests BOTTLES DRAWN AEROBIC AND ANAEROBIC 10CC  Final   Culture NO GROWTH < 24 HOURS  Final   Report Status PENDING  Incomplete  Culture, blood (routine x 2)     Status: None (Preliminary result)   Collection Time: 07/18/15 12:53 PM  Result Value Ref Range Status   Specimen Description BLOOD LEFT HAND  Final   Special Requests BOTTLES DRAWN AEROBIC ONLY 3CC  Final   Culture NO GROWTH < 24 HOURS  Final   Report Status PENDING  Incomplete     Studies: Dg Chest 2 View  07/19/2015  CLINICAL DATA:  Fever and weakness EXAM: CHEST  2 VIEW COMPARISON:  Chest x-ray dated 07/18/2015. FINDINGS: Heart size is normal. Cardiomediastinal silhouette is stable in size and configuration. Mild atherosclerotic changes again noted at the  aortic knob. Lungs are clear. Lung volumes are normal. Pulmonary vasculature appears normal. No pleural effusions seen. No pneumothorax. No acute osseous abnormality. IMPRESSION: Lungs are clear and there is no evidence of acute cardiopulmonary abnormality. No pneumonia seen. Electronically Signed   By: Franki Cabot M.D.   On: 07/19/2015 08:14   Dg Chest 2 View  07/18/2015  CLINICAL DATA:  Leukocytosis. EXAM: CHEST  2 VIEW COMPARISON:  None. FINDINGS: The heart size and mediastinal contours are within normal limits. Both lungs are clear. The visualized skeletal structures are unremarkable. IMPRESSION: No active cardiopulmonary  disease. Electronically Signed   By: Marijo Conception, M.D.   On: 07/18/2015 12:20   US Abdomen Complete  07/18/2015  CLINICAL DATA:  Transaminitis. Additional history of diabetes, hypertension, renal disorder EXAM: ULTRASOUND ABDOMEN COMPLETE COMPARISON:  Abdominal MRI dated 12/28/2013 and CT abdomen dated 11/28/2013. FINDINGS: Gallbladder: Gallbladder is contracted which limits characterization of its walls but there is no evidence of gallbladder wall thickening or pericholecystic fluid. No gallstones seen. No sonographic Murphy's sign elicited, per the sonographer. Common bile duct: Diameter: Common bile duct within normal limits at 6 mm. No intrahepatic bile duct dilatation identified. Liver: No focal lesion identified. Within normal limits in parenchymal echogenicity. Main portal vein is shown to be patent with appropriate hepatopetal direction of blood flow. IVC: No abnormality visualized. Pancreas: Visualized portion unremarkable. Spleen: Size and appearance within normal limits. Right Kidney: Length: 10.2 cm. Right renal cortex thickness and overall echogenicity is within normal limits. Small cyst seen exophytic to the lower pole of the right kidney, also identified on earlier abdomen MRI. No suspicious mass or lesion within the right kidney. No renal stone or hydronephrosis. Left Kidney: Length: 11.4 cm. Left renal cortex thickness and overall echogenicity is within normal limits. Cyst exophytic to the upper pole region measures 2.6 x 1.9 cm, compatible with the Bosniak 2 cyst described on previous MRI, similar in size. Additional complex cyst within the midpole region measures 2 x 1.8 cm, also compatible with the Bosniak 2 cyst described on previous MRI, also similar in size. No new mass or cyst identified within the left kidney. No renal stone or hydronephrosis. Abdominal aorta: No aneurysm visualized. Distal portion of the abdominal aorta obscured by overlying bowel. Other findings: None.  No free  fluid identified in the abdomen. IMPRESSION: 1. Gallbladder is contracted. Patient was not NPO for this exam. The gallbladder contraction limits characterization but there is no convincing gallbladder wall thickening, pericholecystic edema or other secondary signs of an acute cholecystitis. No gallstones seen. 2. No bile duct dilatation. 3. Liver appears normal. 4. Visualized portions of pancreas appear normal. No peripancreatic fluid. 5. Bilateral renal cysts, similar in appearance to the findings on abdomen MRI dated 12/28/2013. No acute renal abnormality seen. Electronically Signed   By: Franki Cabot M.D.   On: 07/18/2015 11:36   Dg Chest Port 1 View  07/18/2015  CLINICAL DATA:  Wheezing, shortness of breath, cough, history of CHF. EXAM: PORTABLE CHEST 1 VIEW COMPARISON:  07/18/2015 FINDINGS: Slightly shallow inspiration. Normal heart size and pulmonary vascularity. Calcification of the aorta. Mediastinal contours appear intact. No focal airspace disease or consolidation in the lungs. No blunting of costophrenic angles. No pneumothorax. Degenerative changes in the shoulders. IMPRESSION: Shallow inspiration.  No evidence of active pulmonary disease. Electronically Signed   By: Lucienne Capers M.D.   On: 07/18/2015 22:23    Scheduled Meds: . docusate sodium  100 mg Oral BID  .  enoxaparin (LOVENOX) injection  30 mg Subcutaneous Q24H  . finasteride  5 mg Oral Daily  . glimepiride  2 mg Oral BID AC  . insulin aspart  0-15 Units Subcutaneous 6 times per day  . insulin detemir  8 Units Subcutaneous Daily  . linagliptin  5 mg Oral Daily  . piperacillin-tazobactam (ZOSYN)  IV  3.375 g Intravenous 3 times per day  . sodium chloride  3 mL Intravenous Q12H  . tamsulosin  0.4 mg Oral Daily  . vancomycin  750 mg Intravenous Q24H   Continuous Infusions:   Principal Problem:   Sepsis ([place]) Active Problems:   Hypertension goal BP (blood pressure) < 140/90   Hyperlipidemia LDL goal <100   Chronic  kidney disease (CKD), stage III (moderate)   Anemia of chronic renal failure   Acute renal failure (ARF) ([facility])   Transaminitis   Diabetes mellitus, insulin dependent (IDDM), uncontrolled ([place])   Leukocytosis    Time spent: 45 min    Katy Brickell, [facility] Pager [phone]. If 7PM-7AM, please contact night-coverage at [email or web address], password [facility] 07/19/2015, 2:01 PM  LOS: 1 day

## 2015-07-19 NOTE — Progress Notes (Signed)
Just seen that blood pressure is 83/48. Text paged on-call hospitalist to see if they want patient to receive another bolus of fluid since he received a bolus at change of shift for the same issue. Patient is currently sleeping and complains of no pain, dyspnea or discomfort. Awaiting return page and or written orders. Will continue to monitor patient to end of shift.

## 2015-07-19 NOTE — Progress Notes (Signed)
Pt became increasing short of breath with tachycardia and low BPs. MD notified and orders received and pt placed on 2L/. VSS at this time.No change in SOB or pt status and MD notified again. MD came to bedside and rectal temp checked being 102.6. Pt HR increasing and orders received to transfer to step-down. Pt being transferred ASAP. Will continue to monitor pt closely. Leanne Chang, RN

## 2015-07-19 NOTE — Consult Note (Addendum)
CARDIOLOGY CONSULT NOTE   Patient ID: Brian [name] MRN: [id number] DOB/AGE: 1933-11-19 80 y.o.  Admit date: 07/18/2015  Primary Physician   [name], MD Primary Cardiologist   New Reason for Consultation   Elevated troponin  Brian [name] is a 81 y.o. year old male with a history of DM, HTN, HL, nephrolithiasis plus bladder stones requiring extraction. No previous cardiac history. ECG pre-op 2014 was normal, no echo or stress test in the system.  Brian [name] was admitted 11/23 with orthostatic near-syncope, initial SBP 80s but no orthostatic VS performed. Hydrated and BP improved. There was concern for sepsis, T max 101.4, lactic acid level is normal but trending up, WBCs elevated.   Troponin cycled and elevated so cards asked to see. Initial troponin 0.03, steady trend up to most recent one of 1.08.  Brian [name] never gets chest pain. 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 He has some memory issues, but no activity limitations. Denies chest pain, DOE, palpitations. No hx LE edema, orthopnea or PND. His wife checks his BP and it is well-controlled.   This illness started after he ate some fried oysters (pt believes they were undercooked) and became immediately ill. He vomited everything up and got some better, but then got sicker and his wife got him to the ER.    Past Medical History  Diagnosis Date  . Type 2 diabetes mellitus ([place])   . Hypertension   . Hyperlipidemia   . Bladder stone   . History of bladder stone   . Nocturia   . Renal disorder   . Arthritis      Past Surgical History  Procedure Laterality Date  . Cystoscopy/retrograde/ureteroscopy/stone extraction with basket N/A 03/22/2013    Procedure: CYSTOSCOPY BLADDER STONE STONE EXTRACTION;  Surgeon: [name], MD;  Location: [facility];  Service: Urology;  Laterality: N/A;  CYSTOSCOPY      . Tonsillectomy     Allergies  Allergen Reactions  . Niaspan [Niacin Er]   . Shellfish Allergy Other (See Comments)    Pt vomits  . Statins Other (See Comments)    Myalgia  . Zetia [Ezetimibe]    I have reviewed the patient's current medications . aspirin  81 mg Oral Daily  . cefTRIAXone (ROCEPHIN)  IV  1 g Intravenous Q24H  . docusate sodium  100 mg Oral BID  . doxycycline (VIBRAMYCIN) IV  100 mg Intravenous Q12H  . enoxaparin (LOVENOX) injection  30 mg Subcutaneous Q24H  . finasteride  5 mg Oral Daily  . insulin aspart  0-15 Units Subcutaneous 6 times per day  . insulin detemir  8 Units Subcutaneous Daily  . sodium chloride  3 mL Intravenous Q12H  . tamsulosin  0.4 mg Oral Daily  . vancomycin  750 mg Intravenous Q24H   . sodium chloride 75 mL/hr at 07/19/15 1611   acetaminophen, alum & mag hydroxide-simeth, levalbuterol, ondansetron **OR** ondansetron (ZOFRAN) IV  Medication Sig  amLODipine (NORVASC) 10 MG tablet Take 1 tablet (10 mg total) by mouth daily.  aspirin EC 81 MG tablet Take 81 mg by mouth daily.  carvedilol (COREG) 6.25 MG tablet Take 1 tablet by mouth 2 (two) times daily.  docusate sodium (COLACE) 100 MG capsule Take 1 capsule (100 mg total) by mouth 2 (two) times daily.  fenofibrate 160 MG tablet Take 1 tablet (160 mg total)  by mouth daily.  finasteride (PROSCAR) 5 MG tablet Take 1 tablet (5 mg total) by mouth daily.  glimepiride (AMARYL) 2 MG tablet Take 1 tablet (2 mg total) by mouth 2 (two) times daily.  Insulin Detemir (LEVEMIR) 100 UNIT/ML Pen Inject 8 Units into the skin daily.  Multiple Vitamin (MULTIVITAMIN WITH MINERALS) TABS tablet Take 1 tablet by mouth daily.  quinapril (ACCUPRIL) 20 MG tablet Take 1 tablet (20 mg total) by mouth 2 (two) times daily.  sitaGLIPtin (JANUVIA) 25 MG tablet Take 1 tablet (25 mg total) by mouth daily.  Tamsulosin HCl (FLOMAX) 0.4 MG CAPS Take 0.4 mg by mouth daily.  Blood Glucose Monitoring Suppl (ONE TOUCH ULTRA 2)  W/DEVICE KIT   Cholecalciferol (VITAMIN D3) 1000 UNITS CAPS Take 1 capsule by mouth daily.  diphenoxylate-atropine (LOMOTIL) 2.5-0.025 MG per tablet Take 1 tablet by mouth 4 (four) times daily as needed for diarrhea or loose stools.  glucose blood test strip Use as instructed  meloxicam (MOBIC) 7.5 MG tablet Take 7.5 mg by mouth 2 (two) times daily.      Social History   Social History  . Marital Status: Married    Spouse Name: N/A  . Number of Children: N/A  . Years of Education: N/A   Occupational History  . Retired    Social History Main Topics  . Smoking status: Former Smoker -- 0.75 packs/day for 39 years    Types: Cigarettes    Quit date: 03/21/1993  . Smokeless tobacco: Not on file  . Alcohol Use: No  . Drug Use: No  . Sexual Activity: Yes   Other Topics Concern  . Not on file   Social History Narrative   Lives with wife in [place], [place].    Family Status  Relation Status Death Age  . Mother Deceased   . Father Deceased    History reviewed. No pertinent family history.   ROS:  Full 14 point review of systems complete and found to be negative unless listed above.  Physical Exam: Blood pressure 95/63, pulse 135, temperature 102.8 F (39.3 C), temperature source Oral, resp. rate 28, height _0  (1.803 m), weight 174 lb 13.2 oz (79.3 kg), SpO2 98 %.  General: Well developed, well nourished, male in no acute distress Head: Eyes PERRLA, No xanthomas.   Normocephalic and atraumatic, oropharynx without edema or exudate. Dentition: good Lungs: clear bilaterally Heart: HRRR S1 S2, no rub/gallop, No murmur. pulses are 2+ all 4 extrem.   Neck: No carotid bruits. No lymphadenopathy.  JVD not elevated. Abdomen: Bowel sounds present, abdomen soft and non-tender without masses or hernias noted. Msk:  No spine or cva tenderness. No weakness, no joint deformities or effusions. Extremities: No clubbing or cyanosis. No edema.  Neuro: Alert and oriented X 3. No focal  deficits noted. Psych:  Good affect, responds appropriately Skin: No rashes or lesions noted.  Labs:  Lab Results  Component Value Date   WBC 16.6* 07/19/2015   HGB 10.1* 07/19/2015   HCT 29.2* 07/19/2015   MCV 84.1 07/19/2015   PLT 226 07/19/2015     Recent Labs Lab 07/19/15 1418  NA 139  K 4.1  CL 109  CO2 21*  BUN 41*  CREATININE 2.24*  CALCIUM 8.4*  PROT 6.7  BILITOT 0.8  ALKPHOS 79  ALT 150*  AST 211*  GLUCOSE 161*  ALBUMIN 2.6*    Recent Labs  07/18/15 1250 07/18/15 1818 07/19/15 0025 07/19/15 1418  TROPONINI 0.12* 0.15* 0.30* 1.08*  TSH  Date/Time Value Ref Range Status  07/18/2015 09:53 AM 0.679 0.350 - 4.500 uIU/mL Final   Lab Results  Component Value Date   HGBA1C 8.1* 07/18/2015    Echo: ordered  ECG: 07/18/2015 SR, HR 99 No acute changes   Radiology:  Dg Chest 2 View 07/19/2015  CLINICAL DATA:  Fever and weakness EXAM: CHEST  2 VIEW COMPARISON:  Chest x-ray dated 07/18/2015. FINDINGS: Heart size is normal. Cardiomediastinal silhouette is stable in size and configuration. Mild atherosclerotic changes again noted at the aortic knob. Lungs are clear. Lung volumes are normal. Pulmonary vasculature appears normal. No pleural effusions seen. No pneumothorax. No acute osseous abnormality. IMPRESSION: Lungs are clear and there is no evidence of acute cardiopulmonary abnormality. No pneumonia seen. Electronically Signed   By: Franki Cabot M.D.   On: 07/19/2015 08:14   US Abdomen Complete 07/18/2015  CLINICAL DATA:  Transaminitis. Additional history of diabetes, hypertension, renal disorder EXAM: ULTRASOUND ABDOMEN COMPLETE COMPARISON:  Abdominal MRI dated 12/28/2013 and CT abdomen dated 11/28/2013. FINDINGS: Gallbladder: Gallbladder is contracted which limits characterization of its walls but there is no evidence of gallbladder wall thickening or pericholecystic fluid. No gallstones seen. No sonographic Murphy's sign elicited, per the sonographer.  Common bile duct: Diameter: Common bile duct within normal limits at 6 mm. No intrahepatic bile duct dilatation identified. Liver: No focal lesion identified. Within normal limits in parenchymal echogenicity. Main portal vein is shown to be patent with appropriate hepatopetal direction of blood flow. IVC: No abnormality visualized. Pancreas: Visualized portion unremarkable. Spleen: Size and appearance within normal limits. Right Kidney: Length: 10.2 cm. Right renal cortex thickness and overall echogenicity is within normal limits. Small cyst seen exophytic to the lower pole of the right kidney, also identified on earlier abdomen MRI. No suspicious mass or lesion within the right kidney. No renal stone or hydronephrosis. Left Kidney: Length: 11.4 cm. Left renal cortex thickness and overall echogenicity is within normal limits. Cyst exophytic to the upper pole region measures 2.6 x 1.9 cm, compatible with the Bosniak 2 cyst described on previous MRI, similar in size. Additional complex cyst within the midpole region measures 2 x 1.8 cm, also compatible with the Bosniak 2 cyst described on previous MRI, also similar in size. No new mass or cyst identified within the left kidney. No renal stone or hydronephrosis. Abdominal aorta: No aneurysm visualized. Distal portion of the abdominal aorta obscured by overlying bowel. Other findings: None.  No free fluid identified in the abdomen. IMPRESSION: 1. Gallbladder is contracted. Patient was not NPO for this exam. The gallbladder contraction limits characterization but there is no convincing gallbladder wall thickening, pericholecystic edema or other secondary signs of an acute cholecystitis. No gallstones seen. 2. No bile duct dilatation. 3. Liver appears normal. 4. Visualized portions of pancreas appear normal. No peripancreatic fluid. 5. Bilateral renal cysts, similar in appearance to the findings on abdomen MRI dated 12/28/2013. No acute renal abnormality seen.  Electronically Signed   By: Franki Cabot M.D.   On: 07/18/2015 11:36   ASSESSMENT AND PLAN:   The patient was seen today by Dr Bronson Ing, the patient evaluated and the data reviewed.  Principal Problem:   Sepsis ([place]) - per IM - on ABX - Fever, WBCs and lactic acid trending up - urine cx and CXR OK, blood cx negative so far  Active Problems:   Hypertension goal BP (blood pressure) < 140/90 - no history per wife, who checks his BP at home - per  IM, BP is low now    Hyperlipidemia LDL goal <100 - per IM    Chronic kidney disease (CKD), stage III (moderate) - think he needs additional hydration, will order    Anemia of chronic renal failure - per IM    Acute renal failure (ARF) ([facility]) - BUN/Cr trending down    Transaminitis - LFTs are going up    Diabetes mellitus, insulin dependent (IDDM), uncontrolled ([place]) - per IM - per wife, CBGs had been higher recently    Leukocytosis - per IM  SignedRosaria Ferries, PA-C 07/19/2015 4:50 PM Beeper [phone]  Co-Sign MD  The patient was seen and examined, and I agree with the assessment and plan as documented above, with modifications as noted below. Pt admitted with hypotension and sepsis with gradual rise in troponins. Denies cardiac symptoms altogether both now and in outpatient setting. Only possible source of infection by history is consumption of oysters which caused nausea, vomiting, and diaphoresis. This happened on two occasions. Started on ASA and echocardiogram ordered. Troponin elevation likely due to demand ischemia.  No acute ischemic ECG findings. Currently in sinus tachycardia. No pathologic cardiac physical exam findings. CV risk factors include diabetes and hypertension. Would not pursue additional noninvasive cardiac investigations at this time.  Will give IV fluids to support BP.  WBC rising. On doxycycline and ceftriaxone.  Kate Sable, MD, [facility]  07/19/2015 5:11 PM

## 2015-07-19 NOTE — Progress Notes (Signed)
CRITICAL VALUE ALERT  Critical value received:  Troponin 1.08  Date of notification:  07/19/2015  Time of notification:  1532  Critical value read back:Yes.    Nurse who received alert:  Pieter Partridge, Rn  MD notified (1st page):  Dr. Coralyn Pear  Time of first page:  1535  MD notified (2nd page):  Time of second page:  Responding MD:  Dr. Coralyn Pear  Time MD responded:  [phone]

## 2015-07-19 NOTE — Progress Notes (Signed)
Follow up note  Labs followed up, troponin trending up: 0.12>0.15>0.3>1.08 (latest) I suspect this is related to demand ischemia; having tachycardia and hypotensive with acute on chronic renal failure.  Plan -Will start ASA 81 mg PO q daily  -Check 2D echo for wall motion abnormalities -Continue cycling troponins -Consult cardiology.

## 2015-07-20 ENCOUNTER — Inpatient Hospital Stay (HOSPITAL_COMMUNITY): Payer: Medicare Other

## 2015-07-20 ENCOUNTER — Encounter (HOSPITAL_COMMUNITY): Payer: Self-pay

## 2015-07-20 DIAGNOSIS — R0602 Shortness of breath: Secondary | ICD-10-CM

## 2015-07-20 DIAGNOSIS — R011 Cardiac murmur, unspecified: Secondary | ICD-10-CM

## 2015-07-20 DIAGNOSIS — K750 Abscess of liver: Secondary | ICD-10-CM | POA: Insufficient documentation

## 2015-07-20 DIAGNOSIS — I10 Essential (primary) hypertension: Secondary | ICD-10-CM

## 2015-07-20 DIAGNOSIS — N179 Acute kidney failure, unspecified: Secondary | ICD-10-CM | POA: Insufficient documentation

## 2015-07-20 LAB — COMPREHENSIVE METABOLIC PANEL
ALK PHOS: 60 U/L (ref 38–126)
ALT: 142 U/L — ABNORMAL HIGH (ref 17–63)
ANION GAP: 7 (ref 5–15)
AST: 179 U/L — ABNORMAL HIGH (ref 15–41)
Albumin: 2.2 g/dL — ABNORMAL LOW (ref 3.5–5.0)
BILIRUBIN TOTAL: 0.7 mg/dL (ref 0.3–1.2)
BUN: 31 mg/dL — ABNORMAL HIGH (ref 6–20)
CALCIUM: 7.9 mg/dL — AB (ref 8.9–10.3)
CO2: 19 mmol/L — ABNORMAL LOW (ref 22–32)
Chloride: 113 mmol/L — ABNORMAL HIGH (ref 101–111)
Creatinine, Ser: 1.93 mg/dL — ABNORMAL HIGH (ref 0.61–1.24)
GFR calc non Af Amer: 31 mL/min — ABNORMAL LOW (ref >60)
GFR, EST AFRICAN AMERICAN: 36 mL/min — AB (ref >60)
GLUCOSE: 79 mg/dL (ref 65–99)
Potassium: 3.3 mmol/L — ABNORMAL LOW (ref 3.5–5.1)
Sodium: 139 mmol/L (ref 135–145)
TOTAL PROTEIN: 5.4 g/dL — AB (ref 6.5–8.1)

## 2015-07-20 LAB — CBC
HCT: 25.8 % — ABNORMAL LOW (ref 39.0–52.0)
HEMOGLOBIN: 9.1 g/dL — AB (ref 13.0–17.0)
MCH: 29.3 pg (ref 26.0–34.0)
MCHC: 35.3 g/dL (ref 30.0–36.0)
MCV: 83 fL (ref 78.0–100.0)
PLATELETS: 223 10*3/uL (ref 150–400)
RBC: 3.11 MIL/uL — AB (ref 4.22–5.81)
RDW: 13.9 % (ref 11.5–15.5)
WBC: 18.1 10*3/uL — ABNORMAL HIGH (ref 4.0–10.5)

## 2015-07-20 LAB — GLUCOSE, CAPILLARY
GLUCOSE-CAPILLARY: 142 mg/dL — AB (ref 65–99)
GLUCOSE-CAPILLARY: 123 mg/dL — AB (ref 65–99)
GLUCOSE-CAPILLARY: 98 mg/dL (ref 65–99)
GLUCOSE-CAPILLARY: 99 mg/dL (ref 65–99)
Glucose-Capillary: 159 mg/dL — ABNORMAL HIGH (ref 65–99)
Glucose-Capillary: 184 mg/dL — ABNORMAL HIGH (ref 65–99)
Glucose-Capillary: 194 mg/dL — ABNORMAL HIGH (ref 65–99)

## 2015-07-20 LAB — TROPONIN I: Troponin I: 0.32 ng/mL — ABNORMAL HIGH (ref <0.031)

## 2015-07-20 MED ORDER — ENOXAPARIN SODIUM 30 MG/0.3ML ~~LOC~~ SOLN: 30.0000 mg | SUBCUTANEOUS | Status: DC

## 2015-07-20 MED ORDER — METRONIDAZOLE IN NACL 5-0.79 MG/ML-% IV SOLN
500.0000 mg | Freq: Three times a day (TID) | INTRAVENOUS | Status: DC
  Administered 2015-07-20 – 2015-07-24 (×12): 500 mg via INTRAVENOUS
  Filled 2015-07-20 (×12): qty 100

## 2015-07-20 MED ORDER — POTASSIUM CHLORIDE CRYS ER 20 MEQ PO TBCR
40.0000 meq | EXTENDED_RELEASE_TABLET | Freq: Once | ORAL | Status: AC
  Administered 2015-07-20: 40 meq via ORAL
  Filled 2015-07-20: qty 2

## 2015-07-20 MED ORDER — DEXTROSE 5 % IV SOLN
1.0000 g | Freq: Two times a day (BID) | INTRAVENOUS | Status: DC
  Administered 2015-07-20 – 2015-07-24 (×8): 1 g via INTRAVENOUS
  Filled 2015-07-20 (×10): qty 1

## 2015-07-20 MED ORDER — DEXTROSE 5 % IV SOLN: 2.0000 g | INTRAVENOUS | Status: DC

## 2015-07-20 MED ORDER — GADOBENATE DIMEGLUMINE 529 MG/ML IV SOLN
8.0000 mL | Freq: Once | INTRAVENOUS | Status: AC | PRN
  Administered 2015-07-20: 8 mL via INTRAVENOUS

## 2015-07-20 NOTE — Progress Notes (Signed)
  Echocardiogram 2D Echocardiogram has been performed.  Brian [name] 07/20/2015, 9:20 AM

## 2015-07-20 NOTE — Consult Note (Signed)
[facility]  Total days of antibiotics 3        Day 3 ceftriaxone        Day 2 doxy               Reason for Consult: liver abscess and bacteremia    Referring Physician: zamora  Principal Problem:   Sepsis (HCC) Active Problems:   Hypertension goal BP (blood pressure) < 140/90   Hyperlipidemia LDL goal <100   Chronic kidney disease (CKD), stage III (moderate)   Anemia of chronic renal failure   Acute renal failure (ARF) (HCC)   Transaminitis   Diabetes mellitus, insulin dependent (IDDM), uncontrolled (HCC)   Leukocytosis   Acute kidney injury (HCC)   SOB (shortness of breath)   Liver abscess    HPI: Brian [name] is a 81 y.o. male  with past medical history of diabetes c/b  diabetic nephropathy, dyslipidemia, hypertension, history of remote diverticular bleed. He was admitted on 11/23 for 3-4 d history of  chills, fever, with occasional rigors, weakness and elevated BS. He reports that roughly 1-2 wk ago he became ill with n/v/d after eating oysters. In the ED he was found to be febrile, tachycardic, with leukocytosis of 16K.  He denied any upper respiratory symptoms such as cough or difficulty breathing, no urinary symptoms, no change in urinary output, no change in stools noting they are dark, no weight loss, he continues to report weakness and fatigue. His infectious work up revealed 1 of 2 blood cx GN coccobacilli, plus also found to have 8.6 x 5.8 x 7.9 cm multi locular cystic liver mass concerning for liver abscess. He was originally started on vancomycin and piptazo. Changed to ceftriaxone plus doxy due to concern for vibrio due to oyster exposure.   Past Medical History  Diagnosis Date  . Type 2 diabetes mellitus (HCC)   . Hypertension   . Hyperlipidemia   . Bladder stone   . History of bladder stone   . Nocturia   . Renal disorder   . Arthritis     Allergies:  Allergies  Allergen Reactions  . Niaspan [Niacin Er]   . Shellfish  Allergy Other (See Comments)    Pt vomits  . Statins Other (See Comments)    Myalgia  . Zetia [Ezetimibe]     MEDICATIONS: . aspirin  81 mg Oral Daily  . cefTRIAXone (ROCEPHIN)  IV  1 g Intravenous Q24H  . docusate sodium  100 mg Oral BID  . doxycycline (VIBRAMYCIN) IV  100 mg Intravenous Q12H  . enoxaparin (LOVENOX) injection  30 mg Subcutaneous Q24H  . finasteride  5 mg Oral Daily  . insulin aspart  0-15 Units Subcutaneous 6 times per day  . insulin detemir  8 Units Subcutaneous Daily  . metronidazole  500 mg Intravenous Q8H  . sodium chloride  3 mL Intravenous Q12H  . tamsulosin  0.4 mg Oral Daily    Social History  Substance Use Topics  . Smoking status: Former Smoker -- 0.75 packs/day for 39 years    Types: Cigarettes    Quit date: 03/21/1993  . Smokeless tobacco: None  . Alcohol Use: 0.0 oz/week    0 Standard drinks or equivalent per week    Family History  Problem Relation Age of Onset  . Congestive Heart Failure      Review of Systems  Constitutional: positive for fever, chills, diaphoresis, activity change, appetite change, fatigue and unexpected weight change.  HENT: Negative for congestion, sore throat, rhinorrhea, sneezing, trouble swallowing and sinus pressure.  Eyes: Negative for photophobia and visual disturbance.  Respiratory: Negative for cough, chest tightness, shortness of breath, wheezing and stridor.  Cardiovascular: Negative for chest pain, palpitations and leg swelling.  Gastrointestinal: +diarrhea since imaging. Negative for nausea, vomiting, abdominal pain, diarrhea, constipation, blood in stool, abdominal distention and anal bleeding.  Genitourinary: Negative for dysuria, hematuria, flank pain and difficulty urinating.  Musculoskeletal: Negative for myalgias, back pain, joint swelling, arthralgias and gait problem.  Skin: Negative for color change, pallor, rash and wound.  Neurological: Negative for dizziness, tremors, weakness and  light-headedness.  Hematological: Negative for adenopathy. Does not bruise/bleed easily.  Psychiatric/Behavioral: Negative for behavioral problems, confusion, sleep disturbance, dysphoric mood, decreased concentration and agitation.     OBJECTIVE: Temp:  [98.6 F (37 C)-101.5 F (38.6 C)] 101.5 F (38.6 C) (11/25 1537) Pulse Rate:  [98-121] 117 (11/25 1300) Resp:  [20-34] 22 (11/25 1300) BP: (95-147)/(62-109) 134/87 mmHg (11/25 1300) SpO2:  [98 %-100 %] 99 % (11/25 1300) Physical Exam  Constitutional: He is oriented to person, place, and time. He appears well-developed and well-nourished. No distress.  HENT:  Mouth/Throat: Oropharynx is clear and moist. No oropharyngeal exudate.  Cardiovascular: Normal rate, regular rhythm and normal heart sounds. Exam reveals no gallop and no friction rub.  No murmur heard.  Pulmonary/Chest: Effort normal and breath sounds normal. No respiratory distress. He has no wheezes.  Abdominal: protuberant abdomen, mildly distended. Bowel sounds are normal. He exhibits no distension. There is no tenderness.  Lymphadenopathy:  He has no cervical adenopathy.  Neurological: He is alert and oriented to person, place, and time.  Skin: Skin is warm and dry. No rash noted. No erythema.  Psychiatric: He has a normal mood and affect. His behavior is normal.     LABS: Results for orders placed or performed during the hospital encounter of 07/18/15 (from the past 48 hour(s))  Glucose, capillary     Status: Abnormal   Collection Time: 07/18/15  4:33 PM  Result Value Ref Range   Glucose-Capillary 255 (H) 65 - 99 mg/dL  Troponin I (q 6hr x 3)     Status: Abnormal   Collection Time: 07/18/15  6:18 PM  Result Value Ref Range   Troponin I 0.15 (H) <0.031 ng/mL    Comment:        PERSISTENTLY INCREASED TROPONIN VALUES IN THE RANGE OF 0.04-0.49 ng/mL CAN BE SEEN IN:       -UNSTABLE ANGINA       -CONGESTIVE HEART FAILURE       -MYOCARDITIS       -CHEST TRAUMA        -ARRYHTHMIAS       -LATE PRESENTING MYOCARDIAL INFARCTION       -COPD   CLINICAL FOLLOW-UP RECOMMENDED.   Lactic acid, plasma     Status: None   Collection Time: 07/18/15  7:04 PM  Result Value Ref Range   Lactic Acid, Venous 1.1 0.5 - 2.0 mmol/L  Glucose, capillary     Status: Abnormal   Collection Time: 07/18/15  9:40 PM  Result Value Ref Range   Glucose-Capillary 179 (H) 65 - 99 mg/dL   Comment 1 Notify RN    Comment 2 Document in Chart   Lactic acid, plasma     Status: None   Collection Time: 07/18/15  9:53 PM  Result Value Ref Range   Lactic Acid, Venous 1.4 0.5 - 2.0 mmol/L  Troponin I (q 6hr x 3)     Status: Abnormal   Collection Time: 07/19/15 12:25 AM  Result Value Ref Range   Troponin I 0.30 (H) <0.031 ng/mL    Comment:        PERSISTENTLY INCREASED TROPONIN VALUES IN THE RANGE OF 0.04-0.49 ng/mL CAN BE SEEN IN:       -UNSTABLE ANGINA       -CONGESTIVE HEART FAILURE       -MYOCARDITIS       -CHEST TRAUMA       -ARRYHTHMIAS       -LATE PRESENTING MYOCARDIAL INFARCTION       -COPD   CLINICAL FOLLOW-UP RECOMMENDED.   Comprehensive metabolic panel     Status: Abnormal   Collection Time: 07/19/15 12:25 AM  Result Value Ref Range   Sodium 135 135 - 145 mmol/L   Potassium 3.5 3.5 - 5.1 mmol/L   Chloride 107 101 - 111 mmol/L   CO2 19 (L) 22 - 32 mmol/L   Glucose, Bld 195 (H) 65 - 99 mg/dL   BUN 39 (H) 6 - 20 mg/dL   Creatinine, Ser 2.36 (H) 0.61 - 1.24 mg/dL   Calcium 8.3 (L) 8.9 - 10.3 mg/dL   Total Protein 5.7 (L) 6.5 - 8.1 g/dL   Albumin 2.4 (L) 3.5 - 5.0 g/dL   AST 125 (H) 15 - 41 U/L   ALT 90 (H) 17 - 63 U/L   Alkaline Phosphatase 51 38 - 126 U/L   Total Bilirubin 1.1 0.3 - 1.2 mg/dL   GFR calc non Af Amer 25 (L) >60 mL/min   GFR calc Af Amer 28 (L) >60 mL/min    Comment: (NOTE) The eGFR has been calculated using the CKD EPI equation. This calculation has not been validated in all clinical situations. eGFR's persistently <60 mL/min signify possible  Chronic Kidney Disease.    Anion gap 9 5 - 15  CBC     Status: Abnormal   Collection Time: 07/19/15 12:25 AM  Result Value Ref Range   WBC 11.2 (H) 4.0 - 10.5 K/uL   RBC 3.02 (L) 4.22 - 5.81 MIL/uL   Hemoglobin 8.8 (L) 13.0 - 17.0 g/dL   HCT 25.3 (L) 39.0 - 52.0 %   MCV 83.8 78.0 - 100.0 fL   MCH 29.1 26.0 - 34.0 pg   MCHC 34.8 30.0 - 36.0 g/dL   RDW 13.8 11.5 - 15.5 %   Platelets 194 150 - 400 K/uL  Glucose, capillary     Status: Abnormal   Collection Time: 07/19/15  6:33 AM  Result Value Ref Range   Glucose-Capillary 221 (H) 65 - 99 mg/dL  Glucose, capillary     Status: Abnormal   Collection Time: 07/19/15 11:26 AM  Result Value Ref Range   Glucose-Capillary 207 (H) 65 - 99 mg/dL  Culture, blood (routine x 2)     Status: None (Preliminary result)   Collection Time: 07/19/15  2:10 PM  Result Value Ref Range   Specimen Description BLOOD RIGHT ANTECUBITAL    Special Requests BOTTLES DRAWN AEROBIC ONLY  10CC    Culture NO GROWTH < 24 HOURS    Report Status PENDING   Culture, blood (routine x 2)     Status: None (Preliminary result)   Collection Time: 07/19/15  2:18 PM  Result Value Ref Range   Specimen Description BLOOD RIGHT HAND    Special Requests IN PEDIATRIC BOTTLE  3CC    Culture NO GROWTH <  24 HOURS    Report Status PENDING   Lactic acid, plasma     Status: None   Collection Time: 07/19/15  2:18 PM  Result Value Ref Range   Lactic Acid, Venous 1.8 0.5 - 2.0 mmol/L  Comprehensive metabolic panel     Status: Abnormal   Collection Time: 07/19/15  2:18 PM  Result Value Ref Range   Sodium 139 135 - 145 mmol/L   Potassium 4.1 3.5 - 5.1 mmol/L   Chloride 109 101 - 111 mmol/L   CO2 21 (L) 22 - 32 mmol/L   Glucose, Bld 161 (H) 65 - 99 mg/dL   BUN 41 (H) 6 - 20 mg/dL   Creatinine, Ser 2.24 (H) 0.61 - 1.24 mg/dL   Calcium 8.4 (L) 8.9 - 10.3 mg/dL   Total Protein 6.7 6.5 - 8.1 g/dL   Albumin 2.6 (L) 3.5 - 5.0 g/dL   AST 211 (H) 15 - 41 U/L   ALT 150 (H) 17 - 63 U/L    Alkaline Phosphatase 79 38 - 126 U/L   Total Bilirubin 0.8 0.3 - 1.2 mg/dL   GFR calc non Af Amer 26 (L) >60 mL/min   GFR calc Af Amer 30 (L) >60 mL/min    Comment: (NOTE) The eGFR has been calculated using the CKD EPI equation. This calculation has not been validated in all clinical situations. eGFR's persistently <60 mL/min signify possible Chronic Kidney Disease.    Anion gap 9 5 - 15  CBC     Status: Abnormal   Collection Time: 07/19/15  2:18 PM  Result Value Ref Range   WBC 16.6 (H) 4.0 - 10.5 K/uL   RBC 3.47 (L) 4.22 - 5.81 MIL/uL   Hemoglobin 10.1 (L) 13.0 - 17.0 g/dL   HCT 29.2 (L) 39.0 - 52.0 %   MCV 84.1 78.0 - 100.0 fL   MCH 29.1 26.0 - 34.0 pg   MCHC 34.6 30.0 - 36.0 g/dL   RDW 13.9 11.5 - 15.5 %   Platelets 226 150 - 400 K/uL  Troponin I     Status: Abnormal   Collection Time: 07/19/15  2:18 PM  Result Value Ref Range   Troponin I 1.08 (HH) <0.031 ng/mL    Comment:        POSSIBLE MYOCARDIAL ISCHEMIA. SERIAL TESTING RECOMMENDED. CRITICAL RESULT CALLED TO, READ BACK BY AND VERIFIED WITHEarnestine Leys RN [phone] GREEN R   Glucose, capillary     Status: Abnormal   Collection Time: 07/19/15  3:53 PM  Result Value Ref Range   Glucose-Capillary 136 (H) 65 - 99 mg/dL  MRSA PCR Screening     Status: None   Collection Time: 07/19/15  4:12 PM  Result Value Ref Range   MRSA by PCR NEGATIVE NEGATIVE    Comment:        The GeneXpert MRSA Assay (FDA approved for NASAL specimens only), is one component of a comprehensive MRSA colonization surveillance program. It is not intended to diagnose MRSA infection nor to guide or monitor treatment for MRSA infections.   Procalcitonin     Status: None   Collection Time: 07/19/15  4:25 PM  Result Value Ref Range   Procalcitonin 29.52 ng/mL    Comment:        Interpretation: PCT >= 10 ng/mL: Important systemic inflammatory response, almost exclusively due to severe bacterial sepsis or septic shock. (NOTE)          ICU PCT Algorithm  Non ICU PCT Algorithm    ----------------------------     ------------------------------         PCT < 0.25 ng/mL                 PCT < 0.1 ng/mL     Stopping of antibiotics            Stopping of antibiotics       strongly encouraged.               strongly encouraged.    ----------------------------     ------------------------------       PCT level decrease by               PCT < 0.25 ng/mL       >= 80% from peak PCT       OR PCT 0.25 - 0.5 ng/mL          Stopping of antibiotics                                             encouraged.     Stopping of antibiotics           encouraged.    ----------------------------     ------------------------------       PCT level decrease by              PCT >= 0.25 ng/mL       < 80% from peak PCT        AND PCT >= 0.5 ng/mL             Continuing antibiotics                                              encouraged.       Continuing antibiotics            encouraged.    ----------------------------     ------------------------------     PCT level increase compared          PCT > 0.5 ng/mL         with peak PCT AND          PCT >= 0.5 ng/mL             Escalation of antibiotics                                          strongly encouraged.      Escalation of antibiotics        strongly encouraged.   Protime-INR     Status: Abnormal   Collection Time: 07/19/15  4:25 PM  Result Value Ref Range   Prothrombin Time 18.3 (H) 11.6 - 15.2 seconds   INR 1.51 (H) 0.00 - 1.49  APTT     Status: Abnormal   Collection Time: 07/19/15  4:25 PM  Result Value Ref Range   aPTT 40 (H) 24 - 37 seconds    Comment:        IF BASELINE aPTT IS ELEVATED, SUGGEST PATIENT RISK ASSESSMENT BE USED TO DETERMINE APPROPRIATE ANTICOAGULANT THERAPY.   Glucose, capillary     Status:  None   Collection Time: 07/19/15  9:18 PM  Result Value Ref Range   Glucose-Capillary 70 65 - 99 mg/dL  Troponin I     Status: Abnormal   Collection Time:  07/19/15  9:45 PM  Result Value Ref Range   Troponin I 0.23 (H) <0.031 ng/mL    Comment:        PERSISTENTLY INCREASED TROPONIN VALUES IN THE RANGE OF 0.04-0.49 ng/mL CAN BE SEEN IN:       -UNSTABLE ANGINA       -CONGESTIVE HEART FAILURE       -MYOCARDITIS       -CHEST TRAUMA       -ARRYHTHMIAS       -LATE PRESENTING MYOCARDIAL INFARCTION       -COPD   CLINICAL FOLLOW-UP RECOMMENDED. REPEATED TO VERIFY   Glucose, capillary     Status: Abnormal   Collection Time: 07/20/15  1:56 AM  Result Value Ref Range   Glucose-Capillary 142 (H) 65 - 99 mg/dL  Glucose, capillary     Status: Abnormal   Collection Time: 07/20/15  4:00 AM  Result Value Ref Range   Glucose-Capillary 123 (H) 65 - 99 mg/dL  Troponin I     Status: Abnormal   Collection Time: 07/20/15  5:47 AM  Result Value Ref Range   Troponin I 0.32 (H) <0.031 ng/mL    Comment:        PERSISTENTLY INCREASED TROPONIN VALUES IN THE RANGE OF 0.04-0.49 ng/mL CAN BE SEEN IN:       -UNSTABLE ANGINA       -CONGESTIVE HEART FAILURE       -MYOCARDITIS       -CHEST TRAUMA       -ARRYHTHMIAS       -LATE PRESENTING MYOCARDIAL INFARCTION       -COPD   CLINICAL FOLLOW-UP RECOMMENDED.   Comprehensive metabolic panel     Status: Abnormal   Collection Time: 07/20/15  5:47 AM  Result Value Ref Range   Sodium 139 135 - 145 mmol/L   Potassium 3.3 (L) 3.5 - 5.1 mmol/L   Chloride 113 (H) 101 - 111 mmol/L   CO2 19 (L) 22 - 32 mmol/L   Glucose, Bld 79 65 - 99 mg/dL   BUN 31 (H) 6 - 20 mg/dL   Creatinine, Ser 1.93 (H) 0.61 - 1.24 mg/dL   Calcium 7.9 (L) 8.9 - 10.3 mg/dL   Total Protein 5.4 (L) 6.5 - 8.1 g/dL   Albumin 2.2 (L) 3.5 - 5.0 g/dL   AST 179 (H) 15 - 41 U/L   ALT 142 (H) 17 - 63 U/L   Alkaline Phosphatase 60 38 - 126 U/L   Total Bilirubin 0.7 0.3 - 1.2 mg/dL   GFR calc non Af Amer 31 (L) >60 mL/min   GFR calc Af Amer 36 (L) >60 mL/min    Comment: (NOTE) The eGFR has been calculated using the CKD EPI equation. This  calculation has not been validated in all clinical situations. eGFR's persistently <60 mL/min signify possible Chronic Kidney Disease.    Anion gap 7 5 - 15  CBC     Status: Abnormal   Collection Time: 07/20/15  5:47 AM  Result Value Ref Range   WBC 18.1 (H) 4.0 - 10.5 K/uL   RBC 3.11 (L) 4.22 - 5.81 MIL/uL   Hemoglobin 9.1 (L) 13.0 - 17.0 g/dL   HCT 25.8 (L) 39.0 - 52.0 %   MCV 83.0 78.0 -  100.0 fL   MCH 29.3 26.0 - 34.0 pg   MCHC 35.3 30.0 - 36.0 g/dL   RDW 13.9 11.5 - 15.5 %   Platelets 223 150 - 400 K/uL  Glucose, capillary     Status: None   Collection Time: 07/20/15  8:14 AM  Result Value Ref Range   Glucose-Capillary 98 65 - 99 mg/dL  Glucose, capillary     Status: None   Collection Time: 07/20/15 12:59 PM  Result Value Ref Range   Glucose-Capillary 99 65 - 99 mg/dL    MICRO: 11/24 blood cx x 2 ngtd 11/23 blood cx 1 set [facility] IMAGING: Ct Abdomen Pelvis Wo Contrast  07/20/2015  CLINICAL DATA:  Acute onset of renal failure. Severe sepsis. Hypotension. Initial encounter. EXAM: CT ABDOMEN AND PELVIS WITHOUT CONTRAST TECHNIQUE: Multidetector CT imaging of the abdomen and pelvis was performed following the standard protocol without IV contrast. COMPARISON:  Abdominal ultrasound performed 07/18/2015, and MRI of the abdomen performed 12/28/2013. CT of the abdomen from 11/28/2013 FINDINGS: A trace right-sided pleural effusion is noted. There is an unusual heterogeneously hypoattenuating lesion within the posterior right hepatic lobe, measuring 7.1 x 5.0 x 5.7 cm, new from 2015. Malignancy cannot be excluded, though given the patient's sepsis, underlying abscess might have a similar appearance. The gallbladder is within normal limits. The pancreas and adrenal glands are unremarkable. Mild left-sided perinephric stranding is noted, with trace fluid tracking about Gerota's fascia on the left. Mild right-sided perinephric stranding is also seen. A few nonobstructing bilateral renal stones  are seen, measuring up to 5 mm in size. There is no evidence of hydronephrosis. No obstructing ureteral stones are identified. Small bowel loops measure up to 3.0 cm in diameter, borderline normal, without definite evidence for bowel obstruction. The stomach is within normal limits. No acute vascular abnormalities are seen. Relatively diffuse calcification is noted along the abdominal aorta and its branches. The appendix is normal in caliber, without evidence of appendicitis. Scattered diverticulosis is noted along the ascending, descending and proximal sigmoid colon, without evidence of diverticulitis. The bladder is mildly distended and grossly unremarkable in appearance. The prostate remains normal in size. A small to moderate amount of free fluid within the pelvis is nonspecific and may be physiologic in nature. No inguinal lymphadenopathy is seen. No acute osseous abnormalities are identified. IMPRESSION: 1. Unusual large heterogeneous hypoattenuating lesion within the posterior right hepatic lobe, measuring 7.1 x 5.0 x 5.7 cm, new from 2015. Primary hepatic malignancy is a concern, though given the patient's sepsis and on this noncontrast study, underlying abscess might have a similar appearance. Would correlate with LFTs. Dynamic liver protocol MRI or CT is recommended for further evaluation. 2. New left-sided perinephric stranding and fluid tracking about Gerota's fascia raises question for mild left-sided pyelonephritis, given the patient's symptoms. Would correlate with urinalysis results. 3. Small to moderate amount of free fluid within the pelvis is nonspecific and may be physiologic in nature, or could reflect one of the processes above. 4. Few nonobstructing bilateral renal stones measure up to 5 mm in size. 5. Relatively diffuse calcification along the abdominal aorta and branches. 6. Scattered diverticulosis along the ascending, descending and proximal sigmoid colon, without evidence of  diverticulitis. 7. Trace right-sided pleural effusion noted. Electronically Signed   By: Garald Balding M.D.   On: 07/20/2015 07:08   Dg Chest 2 View  07/19/2015  CLINICAL DATA:  Fever and weakness EXAM: CHEST  2 VIEW COMPARISON:  Chest x-ray dated 07/18/2015. FINDINGS:  Heart size is normal. Cardiomediastinal silhouette is stable in size and configuration. Mild atherosclerotic changes again noted at the aortic knob. Lungs are clear. Lung volumes are normal. Pulmonary vasculature appears normal. No pleural effusions seen. No pneumothorax. No acute osseous abnormality. IMPRESSION: Lungs are clear and there is no evidence of acute cardiopulmonary abnormality. No pneumonia seen. Electronically Signed   By: Franki Cabot M.D.   On: 07/19/2015 08:14   Mr Liver W Wo Contrast  07/20/2015  CLINICAL DATA:  81 year old male inpatient with sepsis and new right liver mass on noncontrast CT worrisome for liver abscess. EXAM: MRI ABDOMEN WITHOUT AND WITH CONTRAST TECHNIQUE: Multiplanar multisequence MR imaging of the abdomen was performed both before and after the administration of intravenous contrast. CONTRAST:  60mL MULTIHANCE GADOBENATE DIMEGLUMINE 529 MG/ML IV SOLN COMPARISON:  Unenhanced CT abdomen/ pelvis from earlier today. MRI abdomen from 12/28/2013. FINDINGS: Most of the MRI sequences are significantly motion degraded. Lower chest: Small right and trace left layering pleural effusions. Hepatobiliary: There is a complex 8.6 x 5.8 x 7.9 cm multilocular cystic liver mass centered at the junction of segments 6 and 7 of the right liver lobe, which demonstrates multiple thick irregular enhancing internal septations and irregular peripheral hyperenhancement and restricted diffusion, which was not detected on the 07/18/2015 abdominal sonogram from 2 days prior, and is new since the 12/28/2013 MRI study. No additional liver masses. There is mild diffuse hepatic steatosis on chemical shift imaging. Normal gallbladder with  no cholelithiasis. No biliary ductal dilatation. Common bile duct diameter 4 mm. No choledocholithiasis. Pancreas: No pancreatic mass or duct dilation. No evidence of pancreas divisum. Spleen: Normal size. No mass. Adrenals/Urinary Tract: Normal right adrenal. Stable 1.1 cm left adrenal adenoma. No hydronephrosis. There are stable small parapelvic simple renal cysts in the lower renal sinus bilaterally. Stable Bosniak category 2 nonenhancing T1 hyperintense hemorrhagic/proteinaceous renal cyst in the lateral upper left kidney. Additional smaller simple renal cysts are present in both kidneys. Stomach/Bowel: Grossly normal stomach. Visualized small and large bowel is normal caliber, with no bowel wall thickening. Vascular/Lymphatic: Normal caliber abdominal aorta. Patent portal, splenic, hepatic and renal veins. No pathologically enlarged lymph nodes in the abdomen. Other: No abdominal ascites or focal fluid collection. Musculoskeletal: No aggressive appearing focal osseous lesions. IMPRESSION: 1. Complex 8.6 x 5.8 x 7.9 cm multilocular cystic liver mass centered at the junction of segments 6 and 7 in the right liver lobe, with thick irregular enhancing internal septations, irregular peripheral hyperenhancement and restricted diffusion. Given the clinical setting of sepsis, and given that this liver mass appears new since 12/28/2013 and possibly even new since the abdominal sonogram from 2 days prior, this liver mass is favored to represent an evolving liver abscess. A cystic primary liver neoplasm is less likely but not entirely excluded. 2. Small right and trace left layering pleural effusions. 3. Stable small left adrenal adenoma. 4. Stable small benign Bosniak category 1 and category 2 renal cysts. Electronically Signed   By: Ilona Sorrel M.D.   On: 07/20/2015 13:28   Dg Chest Port 1 View  07/19/2015  CLINICAL DATA:  Shortness of breath.  Fever. EXAM: PORTABLE CHEST 1 VIEW 1:54 p.m. COMPARISON:  07/19/2015  6:21 a.m. and 07/18/2015 FINDINGS: The heart size and mediastinal contours are within normal limits. Both lungs are clear. The visualized skeletal structures are unremarkable. IMPRESSION: No active disease. Electronically Signed   By: Lorriane Shire M.D.   On: 07/19/2015 14:00   Dg Chest [facility]  07/18/2015  CLINICAL DATA:  Wheezing, shortness of breath, cough, history of CHF. EXAM: PORTABLE CHEST 1 VIEW COMPARISON:  07/18/2015 FINDINGS: Slightly shallow inspiration. Normal heart size and pulmonary vascularity. Calcification of the aorta. Mediastinal contours appear intact. No focal airspace disease or consolidation in the lungs. No blunting of costophrenic angles. No pneumothorax. Degenerative changes in the shoulders. IMPRESSION: Shallow inspiration.  No evidence of active pulmonary disease. Electronically Signed   By: Lucienne Capers M.D.   On: 07/18/2015 22:23   Assessment/Plan:  81yo M with DM, CKD admitted for sepsis due to gram negative bacteremia and liver abscess  - will change his antibiotics to ceftaz, will add anaerobic coverage due to liver abscess - await identification of GN isolate, in order to determine how to narrow abtx - liver abscess needs to be drained for source control, which is planned for IR to do tomorrow, please send fluid for culture - source of infection? Possibly related to spoiled oyster. Can keep doxy on for now.  Fever = still high, slightly lower than admit, due to underlying infection and undrained liver abscess  ckd = continue with IVF. Appears still not at baseline but improving  Nadezhda Pollitt B. [facility] [phone]

## 2015-07-20 NOTE — Progress Notes (Signed)
Follow-up Note  MRI of liver revealing a complex 8.6 x 5.8 x 7.9 cm multi locular cystic liver mass. Radiology reporting that in setting of sepsis and new finding compared to sonogram performed several days ago, findings favor evolving liver abscess.  Interventional radiology consulted for CT guided drainage.

## 2015-07-20 NOTE — Progress Notes (Signed)
ANTIBIOTIC CONSULT NOTE - FOLLOW UP  Pharmacy Consult for [facility] Indication: intra-abdominal infection  Allergies  Allergen Reactions  . Niaspan [Niacin Er]   . Shellfish Allergy Other (See Comments)    Pt vomits  . Statins Other (See Comments)    Myalgia  . Zetia [Ezetimibe]     Patient Measurements: Height: 5\' 11"  (180.3 cm) Weight: 174 lb 13.2 oz (79.3 kg) IBW/kg (Calculated) : 75.3 Adjusted Body Weight: n/a  Vital Signs: Temp: 100.3 F (37.9 C) (11/25 1708) Temp Source: Oral (11/25 1708) BP: 115/72 mmHg (11/25 1650) Pulse Rate: 109 (11/25 1650) Intake/Output from previous day: 11/24 0701 - 11/25 0700 In: 7633 [P.O.:582; I.V.:5617.7; IV Piggyback:1433.3] Out: 251 [Urine:250; Stool:1] Intake/Output from this shift:    Labs:  Recent Labs  07/19/15 0025 07/19/15 1418 07/20/15 0547  WBC 11.2* 16.6* 18.1*  HGB 8.8* 10.1* 9.1*  PLT 194 226 223  CREATININE 2.36* 2.24* 1.93*   Estimated Creatinine Clearance: 33.1 mL/min (by C-G formula based on Cr of 1.93). No results for input(s): VANCOTROUGH, VANCOPEAK, VANCORANDOM, GENTTROUGH, GENTPEAK, GENTRANDOM, TOBRATROUGH, TOBRAPEAK, TOBRARND, AMIKACINPEAK, AMIKACINTROU, AMIKACIN in the last 72 hours.   Microbiology: Recent Results (from the past 720 hour(s))  Urine culture     Status: None   Collection Time: 07/18/15 12:03 PM  Result Value Ref Range Status   Specimen Description URINE, CLEAN CATCH  Final   Special Requests NONE  Final   Culture MULTIPLE SPECIES PRESENT, SUGGEST RECOLLECTION  Final   Report Status 07/19/2015 FINAL  Final  Culture, blood (routine x 2)     Status: None (Preliminary result)   Collection Time: 07/18/15 12:50 PM  Result Value Ref Range Status   Specimen Description BLOOD RIGHT ANTECUBITAL  Final   Special Requests BOTTLES DRAWN AEROBIC AND ANAEROBIC 10CC  Final   Culture  Setup Time   Final    GRAM NEGATIVE COCCOBACILLI ANAEROBIC BOTTLE ONLY CRITICAL RESULT CALLED TO, READ BACK BY AND  VERIFIED WITH: A. DAVIS,RN AT 1157 ON [id number] BY S. YARBROUGH    Culture NO GROWTH 2 DAYS  Final   Report Status PENDING  Incomplete  Culture, blood (routine x 2)     Status: None (Preliminary result)   Collection Time: 07/18/15 12:53 PM  Result Value Ref Range Status   Specimen Description BLOOD LEFT HAND  Final   Special Requests BOTTLES DRAWN AEROBIC ONLY 3CC  Final   Culture NO GROWTH 2 DAYS  Final   Report Status PENDING  Incomplete  Culture, blood (routine x 2)     Status: None (Preliminary result)   Collection Time: 07/19/15  2:10 PM  Result Value Ref Range Status   Specimen Description BLOOD RIGHT ANTECUBITAL  Final   Special Requests BOTTLES DRAWN AEROBIC ONLY  10CC  Final   Culture NO GROWTH < 24 HOURS  Final   Report Status PENDING  Incomplete  Culture, blood (routine x 2)     Status: None (Preliminary result)   Collection Time: 07/19/15  2:18 PM  Result Value Ref Range Status   Specimen Description BLOOD RIGHT HAND  Final   Special Requests IN PEDIATRIC BOTTLE  3CC  Final   Culture NO GROWTH < 24 HOURS  Final   Report Status PENDING  Incomplete  MRSA PCR Screening     Status: None   Collection Time: 07/19/15  4:12 PM  Result Value Ref Range Status   MRSA by PCR NEGATIVE NEGATIVE Final    Comment:  The GeneXpert MRSA Assay (FDA approved for NASAL specimens only), is one component of a comprehensive MRSA colonization surveillance program. It is not intended to diagnose MRSA infection nor to guide or monitor treatment for MRSA infections.     Anti-infectives    Start     Dose/Rate Route Frequency Ordered Stop   07/21/15 1430  cefTRIAXone (ROCEPHIN) 2 g in dextrose 5 % 50 mL IVPB  Status:  Discontinued     2 g 100 mL/hr over 30 Minutes Intravenous Every 24 hours 07/20/15 1849 07/20/15 1850   07/21/15 1000  cefTRIAXone (ROCEPHIN) 2 g in dextrose 5 % 50 mL IVPB  Status:  Discontinued     2 g 100 mL/hr over 30 Minutes Intravenous Every 24 hours 07/20/15 1850  07/20/15 1855   07/20/15 1645  metroNIDAZOLE (FLAGYL) IVPB 500 mg     500 mg 100 mL/hr over 60 Minutes Intravenous Every 8 hours 07/20/15 1630     07/19/15 1430  doxycycline (VIBRAMYCIN) 100 mg in dextrose 5 % 250 mL IVPB     100 mg 125 mL/hr over 120 Minutes Intravenous Every 12 hours 07/19/15 1417     07/19/15 1430  cefTRIAXone (ROCEPHIN) 1 g in dextrose 5 % 50 mL IVPB  Status:  Discontinued     1 g 100 mL/hr over 30 Minutes Intravenous Every 24 hours 07/19/15 1417 07/20/15 1849   07/18/15 2200  piperacillin-tazobactam (ZOSYN) IVPB 3.375 g  Status:  Discontinued     3.375 g 12.5 mL/hr over 240 Minutes Intravenous 3 times per day 07/18/15 1850 07/19/15 1415   07/18/15 1900  vancomycin (VANCOCIN) IVPB 750 mg/150 ml premix  Status:  Discontinued     750 mg 150 mL/hr over 60 Minutes Intravenous Every 24 hours 07/18/15 1850 07/20/15 1630   07/18/15 1300  cefTRIAXone (ROCEPHIN) 1 g in dextrose 5 % 50 mL IVPB  Status:  Discontinued     1 g 100 mL/hr over 30 Minutes Intravenous Every 24 hours 07/18/15 1241 07/18/15 1834      Assessment: 81 yo male with liver abscess and bacteremia.  Previously on vancomycin and ceftriaxone, pharmacy asked to switch to ceftazidime to increase gram negative coverage.  Doxycycline to continue.  Scr 1.93, est CrCl ~ 33 ml/min.  Blood cultures growing gram negative coccobacilli.  Goal of Therapy:  Resolution of infection  Plan:  1. Ceftazidime 1g IV q 12 hrs. 2. F/u cultures.  Uvaldo Rising, BCPS  Clinical Pharmacist Pager [phone]  07/20/2015 7:07 PM

## 2015-07-20 NOTE — Consult Note (Signed)
Chief Complaint: Patient was seen in consultation today for liver abscess aspiration/drain Chief Complaint  Patient presents with  . Hyperglycemia   at the request of Dr Coralyn Pear  Referring Physician(s): Dr Coralyn Pear  History of Present Illness: Brian [name] is a 81 y.o. male   Pt admitted through ED with N/V abd pain and fever 103 Ate raw oysters 1 week ago CT reveals hepatic lesion Wbc 18.1 + BC:  Gr neg coccobacilli  MRI 11/25:  IMPRESSION: 1. Complex 8.6 x 5.8 x 7.9 cm multilocular cystic liver mass centered at the junction of segments 6 and 7 in the right liver lobe, with thick irregular enhancing internal septations, irregular peripheral hyperenhancement and restricted diffusion. Given the clinical setting of sepsis, and given that this liver mass appears new since 12/28/2013 and possibly even new since the abdominal sonogram from 2 days prior, this liver mass is favored to represent an evolving liver abscess. A cystic primary liver neoplasm is less likely but not entirely excluded.  Request for hepatic abscess aspiration or drain placement Dr Anselm Pancoast has reviewed imaging and approved procedure Scheduled for 11/26: will hold Lovenox and hold diet  Past Medical History  Diagnosis Date  . Type 2 diabetes mellitus ([place])   . Hypertension   . Hyperlipidemia   . Bladder stone   . History of bladder stone   . Nocturia   . Renal disorder   . Arthritis     Past Surgical History  Procedure Laterality Date  . Cystoscopy/retrograde/ureteroscopy/stone extraction with basket N/A 03/22/2013    Procedure: CYSTOSCOPY BLADDER STONE STONE EXTRACTION;  Surgeon: [name], MD;  Location: [facility];  Service: Urology;  Laterality: N/A;  CYSTOSCOPY    . Tonsillectomy      Allergies: Niaspan; Shellfish allergy; Statins; and Zetia  Medications: Prior to Admission medications   Medication Sig Start Date End Date Taking? Authorizing Provider    amLODipine (NORVASC) 10 MG tablet Take 1 tablet (10 mg total) by mouth daily. 07/10/15  Yes [name], MD  aspirin EC 81 MG tablet Take 81 mg by mouth daily.   Yes Historical Provider, MD  carvedilol (COREG) 6.25 MG tablet Take 1 tablet by mouth 2 (two) times daily. 02/27/15  Yes Historical Provider, MD  docusate sodium (COLACE) 100 MG capsule Take 1 capsule (100 mg total) by mouth 2 (two) times daily. 04/24/15  Yes [name], MD  fenofibrate 160 MG tablet Take 1 tablet (160 mg total) by mouth daily. 06/05/15  Yes [name], MD  finasteride (PROSCAR) 5 MG tablet Take 1 tablet (5 mg total) by mouth daily. 06/15/15  Yes [name], MD  glimepiride (AMARYL) 2 MG tablet Take 1 tablet (2 mg total) by mouth 2 (two) times daily. 06/05/15  Yes [name], MD  Insulin Detemir (LEVEMIR) 100 UNIT/ML Pen Inject 8 Units into the skin daily. 05/08/15  Yes [name], MD  Multiple Vitamin (MULTIVITAMIN WITH MINERALS) TABS tablet Take 1 tablet by mouth daily.   Yes Historical Provider, MD  quinapril (ACCUPRIL) 20 MG tablet Take 1 tablet (20 mg total) by mouth 2 (two) times daily. 02/14/15  Yes [name], MD  sitaGLIPtin (JANUVIA) 25 MG tablet Take 1 tablet (25 mg total) by mouth daily. 05/03/15  Yes [name], MD  Tamsulosin HCl (FLOMAX) 0.4 MG CAPS Take 0.4 mg by mouth daily.   Yes Historical Provider, MD  Blood Glucose Monitoring Suppl (ONE TOUCH ULTRA 2) W/DEVICE KIT  04/24/15   Historical  Provider, MD  Cholecalciferol (VITAMIN D3) 1000 UNITS CAPS Take 1 capsule by mouth daily.    Historical Provider, MD  diphenoxylate-atropine (LOMOTIL) 2.5-0.025 MG per tablet Take 1 tablet by mouth 4 (four) times daily as needed for diarrhea or loose stools. 04/24/15   [name], MD  glucose blood test strip Use as instructed 03/28/15   [name], MD  meloxicam (MOBIC) 7.5 MG tablet Take 7.5 mg by mouth 2 (two) times daily.     Historical Provider, MD     Family History   Problem Relation Age of Onset  . Congestive Heart Failure      Social History   Social History  . Marital Status: Married    Spouse Name: N/A  . Number of Children: N/A  . Years of Education: N/A   Occupational History  . Retired    Social History Main Topics  . Smoking status: Former Smoker -- 0.75 packs/day for 39 years    Types: Cigarettes    Quit date: 03/21/1993  . Smokeless tobacco: None  . Alcohol Use: 0.0 oz/week    0 Standard drinks or equivalent per week  . Drug Use: No  . Sexual Activity: Yes   Other Topics Concern  . None   Social History Narrative   Lives with wife in [place], [place].     Review of Systems: A 12 point ROS discussed and pertinent positives are indicated in the HPI above.  All other systems are negative.  Review of Systems  Constitutional: Positive for fever, activity change and appetite change.  Respiratory: Negative for chest tightness and shortness of breath.   Gastrointestinal: Positive for nausea and abdominal pain.  Neurological: Negative for weakness.  Psychiatric/Behavioral: Negative for behavioral problems and confusion.    Vital Signs: BP 134/87 mmHg  Pulse 117  Temp(Src) 99.6 F (37.6 C) (Oral)  Resp 22  Ht _0  (1.803 m)  Wt 174 lb 13.2 oz (79.3 kg)  BMI 24.39 kg/m2  SpO2 99%  Physical Exam  Constitutional: He is oriented to person, place, and time.  Cardiovascular: Normal rate and regular rhythm.   Pulmonary/Chest: Effort normal and breath sounds normal.  Abdominal: Soft. There is tenderness.  Musculoskeletal: Normal range of motion.  Neurological: He is alert and oriented to person, place, and time.  Skin: Skin is warm and dry.  Psychiatric: He has a normal mood and affect. His behavior is normal. Judgment and thought content normal.    Mallampati Score:  MD Evaluation Airway: WNL Heart: WNL Abdomen: WNL Chest/ Lungs: WNL ASA  Classification: 2 Mallampati/Airway Score: Two  Imaging: Ct Abdomen  Pelvis Wo Contrast  07/20/2015  CLINICAL DATA:  Acute onset of renal failure. Severe sepsis. Hypotension. Initial encounter. EXAM: CT ABDOMEN AND PELVIS WITHOUT CONTRAST TECHNIQUE: Multidetector CT imaging of the abdomen and pelvis was performed following the standard protocol without IV contrast. COMPARISON:  Abdominal ultrasound performed 07/18/2015, and MRI of the abdomen performed 12/28/2013. CT of the abdomen from 11/28/2013 FINDINGS: A trace right-sided pleural effusion is noted. There is an unusual heterogeneously hypoattenuating lesion within the posterior right hepatic lobe, measuring 7.1 x 5.0 x 5.7 cm, new from 2015. Malignancy cannot be excluded, though given the patient's sepsis, underlying abscess might have a similar appearance. The gallbladder is within normal limits. The pancreas and adrenal glands are unremarkable. Mild left-sided perinephric stranding is noted, with trace fluid tracking about Gerota's fascia on the left. Mild right-sided perinephric stranding is also seen. A few nonobstructing bilateral renal  stones are seen, measuring up to 5 mm in size. There is no evidence of hydronephrosis. No obstructing ureteral stones are identified. Small bowel loops measure up to 3.0 cm in diameter, borderline normal, without definite evidence for bowel obstruction. The stomach is within normal limits. No acute vascular abnormalities are seen. Relatively diffuse calcification is noted along the abdominal aorta and its branches. The appendix is normal in caliber, without evidence of appendicitis. Scattered diverticulosis is noted along the ascending, descending and proximal sigmoid colon, without evidence of diverticulitis. The bladder is mildly distended and grossly unremarkable in appearance. The prostate remains normal in size. A small to moderate amount of free fluid within the pelvis is nonspecific and may be physiologic in nature. No inguinal lymphadenopathy is seen. No acute osseous abnormalities  are identified. IMPRESSION: 1. Unusual large heterogeneous hypoattenuating lesion within the posterior right hepatic lobe, measuring 7.1 x 5.0 x 5.7 cm, new from 2015. Primary hepatic malignancy is a concern, though given the patient's sepsis and on this noncontrast study, underlying abscess might have a similar appearance. Would correlate with LFTs. Dynamic liver protocol MRI or CT is recommended for further evaluation. 2. New left-sided perinephric stranding and fluid tracking about Gerota's fascia raises question for mild left-sided pyelonephritis, given the patient's symptoms. Would correlate with urinalysis results. 3. Small to moderate amount of free fluid within the pelvis is nonspecific and may be physiologic in nature, or could reflect one of the processes above. 4. Few nonobstructing bilateral renal stones measure up to 5 mm in size. 5. Relatively diffuse calcification along the abdominal aorta and branches. 6. Scattered diverticulosis along the ascending, descending and proximal sigmoid colon, without evidence of diverticulitis. 7. Trace right-sided pleural effusion noted. Electronically Signed   By: Garald Balding M.D.   On: 07/20/2015 07:08   Dg Chest 2 View  07/19/2015  CLINICAL DATA:  Fever and weakness EXAM: CHEST  2 VIEW COMPARISON:  Chest x-ray dated 07/18/2015. FINDINGS: Heart size is normal. Cardiomediastinal silhouette is stable in size and configuration. Mild atherosclerotic changes again noted at the aortic knob. Lungs are clear. Lung volumes are normal. Pulmonary vasculature appears normal. No pleural effusions seen. No pneumothorax. No acute osseous abnormality. IMPRESSION: Lungs are clear and there is no evidence of acute cardiopulmonary abnormality. No pneumonia seen. Electronically Signed   By: Franki Cabot M.D.   On: 07/19/2015 08:14   Dg Chest 2 View  07/18/2015  CLINICAL DATA:  Leukocytosis. EXAM: CHEST  2 VIEW COMPARISON:  None. FINDINGS: The heart size and mediastinal  contours are within normal limits. Both lungs are clear. The visualized skeletal structures are unremarkable. IMPRESSION: No active cardiopulmonary disease. Electronically Signed   By: Marijo Conception, M.D.   On: 07/18/2015 12:20   US Abdomen Complete  07/18/2015  CLINICAL DATA:  Transaminitis. Additional history of diabetes, hypertension, renal disorder EXAM: ULTRASOUND ABDOMEN COMPLETE COMPARISON:  Abdominal MRI dated 12/28/2013 and CT abdomen dated 11/28/2013. FINDINGS: Gallbladder: Gallbladder is contracted which limits characterization of its walls but there is no evidence of gallbladder wall thickening or pericholecystic fluid. No gallstones seen. No sonographic Murphy's sign elicited, per the sonographer. Common bile duct: Diameter: Common bile duct within normal limits at 6 mm. No intrahepatic bile duct dilatation identified. Liver: No focal lesion identified. Within normal limits in parenchymal echogenicity. Main portal vein is shown to be patent with appropriate hepatopetal direction of blood flow. IVC: No abnormality visualized. Pancreas: Visualized portion unremarkable. Spleen: Size and appearance within normal limits. Right  Kidney: Length: 10.2 cm. Right renal cortex thickness and overall echogenicity is within normal limits. Small cyst seen exophytic to the lower pole of the right kidney, also identified on earlier abdomen MRI. No suspicious mass or lesion within the right kidney. No renal stone or hydronephrosis. Left Kidney: Length: 11.4 cm. Left renal cortex thickness and overall echogenicity is within normal limits. Cyst exophytic to the upper pole region measures 2.6 x 1.9 cm, compatible with the Bosniak 2 cyst described on previous MRI, similar in size. Additional complex cyst within the midpole region measures 2 x 1.8 cm, also compatible with the Bosniak 2 cyst described on previous MRI, also similar in size. No new mass or cyst identified within the left kidney. No renal stone or  hydronephrosis. Abdominal aorta: No aneurysm visualized. Distal portion of the abdominal aorta obscured by overlying bowel. Other findings: None.  No free fluid identified in the abdomen. IMPRESSION: 1. Gallbladder is contracted. Patient was not NPO for this exam. The gallbladder contraction limits characterization but there is no convincing gallbladder wall thickening, pericholecystic edema or other secondary signs of an acute cholecystitis. No gallstones seen. 2. No bile duct dilatation. 3. Liver appears normal. 4. Visualized portions of pancreas appear normal. No peripancreatic fluid. 5. Bilateral renal cysts, similar in appearance to the findings on abdomen MRI dated 12/28/2013. No acute renal abnormality seen. Electronically Signed   By: Franki Cabot M.D.   On: 07/18/2015 11:36   Mr Liver W Wo Contrast  07/20/2015  CLINICAL DATA:  81 year old male inpatient with sepsis and new right liver mass on noncontrast CT worrisome for liver abscess. EXAM: MRI ABDOMEN WITHOUT AND WITH CONTRAST TECHNIQUE: Multiplanar multisequence MR imaging of the abdomen was performed both before and after the administration of intravenous contrast. CONTRAST:  45m MULTIHANCE GADOBENATE DIMEGLUMINE 529 MG/ML IV SOLN COMPARISON:  Unenhanced CT abdomen/ pelvis from earlier today. MRI abdomen from 12/28/2013. FINDINGS: Most of the MRI sequences are significantly motion degraded. Lower chest: Small right and trace left layering pleural effusions. Hepatobiliary: There is a complex 8.6 x 5.8 x 7.9 cm multilocular cystic liver mass centered at the junction of segments 6 and 7 of the right liver lobe, which demonstrates multiple thick irregular enhancing internal septations and irregular peripheral hyperenhancement and restricted diffusion, which was not detected on the 07/18/2015 abdominal sonogram from 2 days prior, and is new since the 12/28/2013 MRI study. No additional liver masses. There is mild diffuse hepatic steatosis on chemical  shift imaging. Normal gallbladder with no cholelithiasis. No biliary ductal dilatation. Common bile duct diameter 4 mm. No choledocholithiasis. Pancreas: No pancreatic mass or duct dilation. No evidence of pancreas divisum. Spleen: Normal size. No mass. Adrenals/Urinary Tract: Normal right adrenal. Stable 1.1 cm left adrenal adenoma. No hydronephrosis. There are stable small parapelvic simple renal cysts in the lower renal sinus bilaterally. Stable Bosniak category 2 nonenhancing T1 hyperintense hemorrhagic/proteinaceous renal cyst in the lateral upper left kidney. Additional smaller simple renal cysts are present in both kidneys. Stomach/Bowel: Grossly normal stomach. Visualized small and large bowel is normal caliber, with no bowel wall thickening. Vascular/Lymphatic: Normal caliber abdominal aorta. Patent portal, splenic, hepatic and renal veins. No pathologically enlarged lymph nodes in the abdomen. Other: No abdominal ascites or focal fluid collection. Musculoskeletal: No aggressive appearing focal osseous lesions. IMPRESSION: 1. Complex 8.6 x 5.8 x 7.9 cm multilocular cystic liver mass centered at the junction of segments 6 and 7 in the right liver lobe, with thick irregular enhancing internal septations, irregular  peripheral hyperenhancement and restricted diffusion. Given the clinical setting of sepsis, and given that this liver mass appears new since 12/28/2013 and possibly even new since the abdominal sonogram from 2 days prior, this liver mass is favored to represent an evolving liver abscess. A cystic primary liver neoplasm is less likely but not entirely excluded. 2. Small right and trace left layering pleural effusions. 3. Stable small left adrenal adenoma. 4. Stable small benign Bosniak category 1 and category 2 renal cysts. Electronically Signed   By: Ilona Sorrel M.D.   On: 07/20/2015 13:28   Dg Chest Port 1 View  07/19/2015  CLINICAL DATA:  Shortness of breath.  Fever. EXAM: PORTABLE CHEST 1  VIEW 1:54 p.m. COMPARISON:  07/19/2015 6:21 a.m. and 07/18/2015 FINDINGS: The heart size and mediastinal contours are within normal limits. Both lungs are clear. The visualized skeletal structures are unremarkable. IMPRESSION: No active disease. Electronically Signed   By: Lorriane Shire M.D.   On: 07/19/2015 14:00   Dg Chest Port 1 View  07/18/2015  CLINICAL DATA:  Wheezing, shortness of breath, cough, history of CHF. EXAM: PORTABLE CHEST 1 VIEW COMPARISON:  07/18/2015 FINDINGS: Slightly shallow inspiration. Normal heart size and pulmonary vascularity. Calcification of the aorta. Mediastinal contours appear intact. No focal airspace disease or consolidation in the lungs. No blunting of costophrenic angles. No pneumothorax. Degenerative changes in the shoulders. IMPRESSION: Shallow inspiration.  No evidence of active pulmonary disease. Electronically Signed   By: Lucienne Capers M.D.   On: 07/18/2015 22:23    Labs:  CBC:  Recent Labs  07/18/15 0830 07/19/15 0025 07/19/15 1418 07/20/15 0547  WBC 16.0* 11.2* 16.6* 18.1*  HGB 10.0* 8.8* 10.1* 9.1*  HCT 29.2* 25.3* 29.2* 25.8*  PLT 237 194 226 223    COAGS:  Recent Labs  07/19/15 1625  INR 1.51*  APTT 40*    BMP:  Recent Labs  07/18/15 0830 07/19/15 0025 07/19/15 1418 07/20/15 0547  NA 133* 135 139 139  K 4.0 3.5 4.1 3.3*  CL 102 107 109 113*  CO2 18* 19* 21* 19*  GLUCOSE 291* 195* 161* 79  BUN 36* 39* 41* 31*  CALCIUM 8.9 8.3* 8.4* 7.9*  CREATININE 2.85* 2.36* 2.24* 1.93*  GFRNONAA 20* 25* 26* 31*  GFRAA 23* 28* 30* 36*    LIVER FUNCTION TESTS:  Recent Labs  07/18/15 0830 07/19/15 0025 07/19/15 1418 07/20/15 0547  BILITOT 1.4* 1.1 0.8 0.7  AST 101* 125* 211* 179*  ALT 67* 90* 150* 142*  ALKPHOS 34* 51 79 60  PROT 7.0 5.7* 6.7 5.4*  ALBUMIN 3.0* 2.4* 2.6* 2.2*    TUMOR MARKERS: No results for input(s): AFPTM, CEA, CA199, CHROMGRNA in the last 8760 hours.  Assessment and Plan:  Hepatic  abscess Possibly from raw oysters Plan for hepatic abscess aspiration- possible drain placement 11/26 in Radiology Risks and Benefits discussed with the patient including bleeding, infection, damage to adjacent structures, bowel perforation/fistula connection, and sepsis. All of the patient's questions were answered, patient is agreeable to proceed. Consent signed and in chart.   Thank you for this interesting consult.  I greatly enjoyed meeting CASHTON HOSLEY and look forward to participating in their care.  A copy of this report was sent to the requesting provider on this date.  Signed: Juanantonio Stolar A 07/20/2015, 2:33 PM   I spent a total of 40 Minutes    in face to face in clinical consultation, greater than 50% of which was counseling/coordinating care for  hepatic abscess asp/drain

## 2015-07-20 NOTE — Progress Notes (Signed)
Utilization Review Completed.  

## 2015-07-20 NOTE — Progress Notes (Signed)
[facility] PROGRESS NOTE  [facility] [id number] DOB: 1934/01/20 DOA: 07/18/2015 PCP: [name], MD  Assessment/Plan: 1. Severe sepsis -Present on admission, evidenced by a temperature of 103, heart rate of 123, acute kidney injury, hypotension with systolic blood pressures in the 80s to 90s (having a history of hypertension), with elevated transaminases. -His source of infection is unclear, as workup thus far has included abdominal ultrasound, chest x-ray, urinalysis which was non-revealing. -He had reported eating oysters earlier this week after which he developed multiple episodes of nausea and vomiting that was associated with diarrhea. This history makes Vibrio parahaemolyticus a concern, particularly since this organism can cause septicemia.  -Will provide empiric coverage for Vibrio parahaemolyticus with doxycycline 100 mg IV twice a day and ceftriaxone 1 g IV every 24 hours until blood culture data is available. -On 07/20/2015: CT scan of abdomen and pelvis performed revealing 7.1 x 5.0 x 5.7 cm hypoattenuating lesion within the liver. Radiology reporting that this could represent an abscess or perhaps primary hepatic malignancy. Plan to further work this up with MRI of liver.  -If this proves to be an abscess will plan to consult IR for CT guided drainage.  -CT scan also revealed left-sided perinephritic stranding raising the possibility of pyelonephritis. Urinalysis that was collected on 07/18/2015 however revealed absence of nitrates and leukocyte esterase, which would be unusual for pyelo.  -Blood cultures drawn on admission growing a gram negative coccobacilli, which would be consistent with Vibrio parahaemolyticus.  -Will continue current IV antibiotic therapy with vancomycin, ceftriaxone, doxycycline . -Infectious disease has been consulted.    2.  Acute on chronic renal failure -Likely secondary to ATN in setting of hypotension or perhaps sepsis/critical  illness -Previous labs performed earlier this year revealed creatinine between 1.4 1.7, initial lab work during this hospitalization revealed creatinine of 2.85 with BUN of 36.this improved with IV fluid administration, currently coming down to 2.36 on a.m. Lab work. -On 07/20/2015 creatinine trending down to 1.9 from 2.24, continue IV fluids  3.  Elevated transaminases -Lab work showing AST of 125 with ALT of 9.0. His total bilirubin was 1.1 and alkaline phosphatase 51. Abdominal ultrasound was unrevealing -Further workup with CT scan of abdomen and pelvis without contrast showed a hypoattenuating lesion within the liver which could represent abscess. -Plan to consult interventional radiology for drainage if this represents abscess.  4.  Elevated troponin. -Lab work showing upward trend in his troponin to 0.30 -He presently denies chest pain. -I suspect related to demand ischemia in setting of critical illness -Troponins trending down.  5. Diabetes mellitus -Will perform Accu-Cheks every 4 hours with sliding scale coverage, continue Levemir -He presented with hyperglycemia likely caused by infection  -Stopping oral hypoglycemic medications, as patient presents with acute kidney injury.   6.  History of hypertension. -He presented with hypotension for which antihypertensive medications were discontinued  7.  DVT prophylaxis. Lovenox  Code Status: full code Family Communication: spoke to his son and daughter who were present at bedside Disposition Plan: Will continue close monitoring in the stepdown unit   Antibiotics:  Vancomycin started on 07/18/2015  Zosyn discontinued on 07/19/2015  Ceftriaxone started on 07/19/2015  Doxycycline started on 07/19/2015  HPI/Subjective: [name] is a 81 year old gentleman with a past medical history of diabetes mellitus, admitted to the medicine service on 07/18/2015 when he presented with complaints of generalized weakness, dizziness,  chills that was social with hyperglycemia. He reports having several episodes of nausea and vomiting earlier  this week after having oysters. Initial lab work revealed acute on chronic kidney injury having creatinine of 2.85 with BUN of 36. He was found to be febrile having a temperature of 103 and started on broad-spectrum IV antimicrobial therapy with vancomycin and Zosyn. Patient also remained hypotensive overnight with systolic blood pressures in the 80s to 90s.workup revealed an abdominal ultrasoundwhich did not reveal acute intra-abdominal pathology. Lab work did reveal elevated transaminases with AST of 125 and ALT of 90.other workup included a chest x-ray and urinalysis which was unremarkable. Patient was transferred to the stepdown unit on 07/19/2015 for closer monitoring.  Objective: Filed Vitals:   07/20/15 1000 07/20/15 1100  BP: 147/109 112/65  Pulse: 121   Temp:    Resp: 34 27    Intake/Output Summary (Last 24 hours) at 07/20/15 1151 Last data filed at 07/20/15 1100  Gross per 24 hour  Intake 7910.97 ml  Output    101 ml  Net 7809.97 ml   Filed Weights   07/18/15 1223 07/19/15 0515 07/19/15 1531  Weight: 75.5 kg (166 lb 7.2 oz) 76.114 kg (167 lb 12.8 oz) 79.3 kg (174 lb 13.2 oz)    Exam:   General:  Toxic appearing, actively having rigors, appears mildly confused  Cardiovascular: tachycardic, regular rate and rhythm normal S1-S2  Respiratory: patient having bilateral expiratory wheezes, normal respiratory effort  Abdomen: on abdominal exam he did not have significant pain with palpation across generalized abdomen, bowel sounds were present, overall nonfocal.  Musculoskeletal: no edema  Data Reviewed: Basic Metabolic Panel:  Recent Labs Lab 07/18/15 0830 07/19/15 0025 07/19/15 1418 07/20/15 0547  NA 133* 135 139 139  K 4.0 3.5 4.1 3.3*  CL 102 107 109 113*  CO2 18* 19* 21* 19*  GLUCOSE 291* 195* 161* 79  BUN 36* 39* 41* 31*  CREATININE 2.85* 2.36* 2.24*  1.93*  CALCIUM 8.9 8.3* 8.4* 7.9*   Liver Function Tests:  Recent Labs Lab 07/18/15 0830 07/19/15 0025 07/19/15 1418 07/20/15 0547  AST 101* 125* 211* 179*  ALT 67* 90* 150* 142*  ALKPHOS 34* 51 79 60  BILITOT 1.4* 1.1 0.8 0.7  PROT 7.0 5.7* 6.7 5.4*  ALBUMIN 3.0* 2.4* 2.6* 2.2*   No results for input(s): LIPASE, AMYLASE in the last 168 hours. No results for input(s): AMMONIA in the last 168 hours. CBC:  Recent Labs Lab 07/18/15 0830 07/19/15 0025 07/19/15 1418 07/20/15 0547  WBC 16.0* 11.2* 16.6* 18.1*  NEUTROABS 12.4*  --   --   --   HGB 10.0* 8.8* 10.1* 9.1*  HCT 29.2* 25.3* 29.2* 25.8*  MCV 85.6 83.8 84.1 83.0  PLT 237 194 226 223   Cardiac Enzymes:  Recent Labs Lab 07/18/15 1818 07/19/15 0025 07/19/15 1418 07/19/15 2145 07/20/15 0547  TROPONINI 0.15* 0.30* 1.08* 0.23* 0.32*   BNP (last 3 results) No results for input(s): BNP in the last 8760 hours.  ProBNP (last 3 results) No results for input(s): PROBNP in the last 8760 hours.  CBG:  Recent Labs Lab 07/19/15 1553 07/19/15 2118 07/20/15 0156 07/20/15 0400 07/20/15 0814  GLUCAP 136* 70 142* 123* 98    Recent Results (from the past 240 hour(s))  Urine culture     Status: None   Collection Time: 07/18/15 12:03 PM  Result Value Ref Range Status   Specimen Description URINE, CLEAN CATCH  Final   Special Requests NONE  Final   Culture MULTIPLE SPECIES PRESENT, SUGGEST RECOLLECTION  Final   Report Status 07/19/2015  FINAL  Final  Culture, blood (routine x 2)     Status: None (Preliminary result)   Collection Time: 07/18/15 12:50 PM  Result Value Ref Range Status   Specimen Description BLOOD RIGHT ANTECUBITAL  Final   Special Requests BOTTLES DRAWN AEROBIC AND ANAEROBIC 10CC  Final   Culture NO GROWTH < 24 HOURS  Final   Report Status PENDING  Incomplete  Culture, blood (routine x 2)     Status: None (Preliminary result)   Collection Time: 07/18/15 12:53 PM  Result Value Ref Range Status    Specimen Description BLOOD LEFT HAND  Final   Special Requests BOTTLES DRAWN AEROBIC ONLY 3CC  Final   Culture NO GROWTH < 24 HOURS  Final   Report Status PENDING  Incomplete  MRSA PCR Screening     Status: None   Collection Time: 07/19/15  4:12 PM  Result Value Ref Range Status   MRSA by PCR NEGATIVE NEGATIVE Final    Comment:        The GeneXpert MRSA Assay (FDA approved for NASAL specimens only), is one component of a comprehensive MRSA colonization surveillance program. It is not intended to diagnose MRSA infection nor to guide or monitor treatment for MRSA infections.      Studies: Ct Abdomen Pelvis Wo Contrast  07/20/2015  CLINICAL DATA:  Acute onset of renal failure. Severe sepsis. Hypotension. Initial encounter. EXAM: CT ABDOMEN AND PELVIS WITHOUT CONTRAST TECHNIQUE: Multidetector CT imaging of the abdomen and pelvis was performed following the standard protocol without IV contrast. COMPARISON:  Abdominal ultrasound performed 07/18/2015, and MRI of the abdomen performed 12/28/2013. CT of the abdomen from 11/28/2013 FINDINGS: A trace right-sided pleural effusion is noted. There is an unusual heterogeneously hypoattenuating lesion within the posterior right hepatic lobe, measuring 7.1 x 5.0 x 5.7 cm, new from 2015. Malignancy cannot be excluded, though given the patient's sepsis, underlying abscess might have a similar appearance. The gallbladder is within normal limits. The pancreas and adrenal glands are unremarkable. Mild left-sided perinephric stranding is noted, with trace fluid tracking about Gerota's fascia on the left. Mild right-sided perinephric stranding is also seen. A few nonobstructing bilateral renal stones are seen, measuring up to 5 mm in size. There is no evidence of hydronephrosis. No obstructing ureteral stones are identified. Small bowel loops measure up to 3.0 cm in diameter, borderline normal, without definite evidence for bowel obstruction. The stomach is within  normal limits. No acute vascular abnormalities are seen. Relatively diffuse calcification is noted along the abdominal aorta and its branches. The appendix is normal in caliber, without evidence of appendicitis. Scattered diverticulosis is noted along the ascending, descending and proximal sigmoid colon, without evidence of diverticulitis. The bladder is mildly distended and grossly unremarkable in appearance. The prostate remains normal in size. A small to moderate amount of free fluid within the pelvis is nonspecific and may be physiologic in nature. No inguinal lymphadenopathy is seen. No acute osseous abnormalities are identified. IMPRESSION: 1. Unusual large heterogeneous hypoattenuating lesion within the posterior right hepatic lobe, measuring 7.1 x 5.0 x 5.7 cm, new from 2015. Primary hepatic malignancy is a concern, though given the patient's sepsis and on this noncontrast study, underlying abscess might have a similar appearance. Would correlate with LFTs. Dynamic liver protocol MRI or CT is recommended for further evaluation. 2. New left-sided perinephric stranding and fluid tracking about Gerota's fascia raises question for mild left-sided pyelonephritis, given the patient's symptoms. Would correlate with urinalysis results. 3. Small to moderate  amount of free fluid within the pelvis is nonspecific and may be physiologic in nature, or could reflect one of the processes above. 4. Few nonobstructing bilateral renal stones measure up to 5 mm in size. 5. Relatively diffuse calcification along the abdominal aorta and branches. 6. Scattered diverticulosis along the ascending, descending and proximal sigmoid colon, without evidence of diverticulitis. 7. Trace right-sided pleural effusion noted. Electronically Signed   By: Garald Balding M.D.   On: 07/20/2015 07:08   Dg Chest 2 View  07/19/2015  CLINICAL DATA:  Fever and weakness EXAM: CHEST  2 VIEW COMPARISON:  Chest x-ray dated 07/18/2015. FINDINGS: Heart  size is normal. Cardiomediastinal silhouette is stable in size and configuration. Mild atherosclerotic changes again noted at the aortic knob. Lungs are clear. Lung volumes are normal. Pulmonary vasculature appears normal. No pleural effusions seen. No pneumothorax. No acute osseous abnormality. IMPRESSION: Lungs are clear and there is no evidence of acute cardiopulmonary abnormality. No pneumonia seen. Electronically Signed   By: Franki Cabot M.D.   On: 07/19/2015 08:14   Dg Chest 2 View  07/18/2015  CLINICAL DATA:  Leukocytosis. EXAM: CHEST  2 VIEW COMPARISON:  None. FINDINGS: The heart size and mediastinal contours are within normal limits. Both lungs are clear. The visualized skeletal structures are unremarkable. IMPRESSION: No active cardiopulmonary disease. Electronically Signed   By: Marijo Conception, M.D.   On: 07/18/2015 12:20   Dg Chest Port 1 View  07/19/2015  CLINICAL DATA:  Shortness of breath.  Fever. EXAM: PORTABLE CHEST 1 VIEW 1:54 p.m. COMPARISON:  07/19/2015 6:21 a.m. and 07/18/2015 FINDINGS: The heart size and mediastinal contours are within normal limits. Both lungs are clear. The visualized skeletal structures are unremarkable. IMPRESSION: No active disease. Electronically Signed   By: Lorriane Shire M.D.   On: 07/19/2015 14:00   Dg Chest Port 1 View  07/18/2015  CLINICAL DATA:  Wheezing, shortness of breath, cough, history of CHF. EXAM: PORTABLE CHEST 1 VIEW COMPARISON:  07/18/2015 FINDINGS: Slightly shallow inspiration. Normal heart size and pulmonary vascularity. Calcification of the aorta. Mediastinal contours appear intact. No focal airspace disease or consolidation in the lungs. No blunting of costophrenic angles. No pneumothorax. Degenerative changes in the shoulders. IMPRESSION: Shallow inspiration.  No evidence of active pulmonary disease. Electronically Signed   By: Lucienne Capers M.D.   On: 07/18/2015 22:23    Scheduled Meds: . aspirin  81 mg Oral Daily  .  cefTRIAXone (ROCEPHIN)  IV  1 g Intravenous Q24H  . docusate sodium  100 mg Oral BID  . doxycycline (VIBRAMYCIN) IV  100 mg Intravenous Q12H  . enoxaparin (LOVENOX) injection  30 mg Subcutaneous Q24H  . finasteride  5 mg Oral Daily  . insulin aspart  0-15 Units Subcutaneous 6 times per day  . insulin detemir  8 Units Subcutaneous Daily  . potassium chloride  40 mEq Oral Once  . sodium chloride  3 mL Intravenous Q12H  . tamsulosin  0.4 mg Oral Daily  . vancomycin  750 mg Intravenous Q24H   Continuous Infusions: . sodium chloride 125 mL/hr at 07/20/15 0404    Principal Problem:   Sepsis ([place]) Active Problems:   Hypertension goal BP (blood pressure) < 140/90   Hyperlipidemia LDL goal <100   Chronic kidney disease (CKD), stage III (moderate)   Anemia of chronic renal failure   Acute renal failure (ARF) (HCC)   Transaminitis   Diabetes mellitus, insulin dependent (IDDM), uncontrolled (HCC)   Leukocytosis  Time spent: 35 min    Kelvin Cellar  [facility] Pager [phone]. If 7PM-7AM, please contact night-coverage at [email or web address], password [facility] 07/20/2015, 11:51 AM  LOS: 2 days

## 2015-07-20 NOTE — Progress Notes (Signed)
CRITICAL VALUE ALERT  Critical value received:  Positive Blood Culture  Date of notification:  07/20/2015  Time of notification:  [id number]  Critical value read back:Yes.    Nurse who received alert:  Hansel Devan,RN   MD notified (1st page):  Spoke to Dr Coralyn Pear who was already aware.

## 2015-07-20 NOTE — Progress Notes (Signed)
PATIENT ID: 81 y.o. year old male with a history of DM, HTN, HL, nephrolithiasis plus bladder stones requiring extraction here with severe sepsis from an unknown source and acute on chronic renal failure.  Cardiology consulted for elevated troponin.  INTERVAL HISTORY: No events.    SUBJECTIVE:  Has chills but denies chest pain, shortness of breath.  He has been wheezing and states that breathing treatments helped.  Minimally productive cough.   PHYSICAL EXAM Filed Vitals:   07/20/15 0000 07/20/15 0359 07/20/15 0551 07/20/15 0815  BP: 137/87 101/64  143/90  Pulse: 114 101 98 114  Temp: 100.1 F (37.8 C) 98.6 F (37 C)  99.4 F (37.4 C)  TempSrc: Oral Oral  Oral  Resp: 34  22 34  Height:      Weight:      SpO2: 100% 100% 98% 100%   General: Ill-appearing.  Rigors, mild respiratory distress and audibly wheezing. Neck: No JVD Lungs:  Diffuse expiratory wheezing with poor air movement. Heart:   Tachycardic.  Regular rhythm.  II/VI early-peaking crescendo-decrescendo murmur at LUSB.  No r/g Abdomen:  Soft, NT, ND.  +BS Extremities:  WWP.  No edema.  LABS: Lab Results  Component Value Date   TROPONINI 0.32* 07/20/2015   Results for orders placed or performed during the hospital encounter of 07/18/15 (from the past 24 hour(s))  Glucose, capillary     Status: Abnormal   Collection Time: 07/19/15 11:26 AM  Result Value Ref Range   Glucose-Capillary 207 (H) 65 - 99 mg/dL  Lactic acid, plasma     Status: None   Collection Time: 07/19/15  2:18 PM  Result Value Ref Range   Lactic Acid, Venous 1.8 0.5 - 2.0 mmol/L  Comprehensive metabolic panel     Status: Abnormal   Collection Time: 07/19/15  2:18 PM  Result Value Ref Range   Sodium 139 135 - 145 mmol/L   Potassium 4.1 3.5 - 5.1 mmol/L   Chloride 109 101 - 111 mmol/L   CO2 21 (L) 22 - 32 mmol/L   Glucose, Bld 161 (H) 65 - 99 mg/dL   BUN 41 (H) 6 - 20 mg/dL   Creatinine, Ser 2.24 (H) 0.61 - 1.24 mg/dL   Calcium 8.4 (L) 8.9 -  10.3 mg/dL   Total Protein 6.7 6.5 - 8.1 g/dL   Albumin 2.6 (L) 3.5 - 5.0 g/dL   AST 211 (H) 15 - 41 U/L   ALT 150 (H) 17 - 63 U/L   Alkaline Phosphatase 79 38 - 126 U/L   Total Bilirubin 0.8 0.3 - 1.2 mg/dL   GFR calc non Af Amer 26 (L) >60 mL/min   GFR calc Af Amer 30 (L) >60 mL/min   Anion gap 9 5 - 15  CBC     Status: Abnormal   Collection Time: 07/19/15  2:18 PM  Result Value Ref Range   WBC 16.6 (H) 4.0 - 10.5 K/uL   RBC 3.47 (L) 4.22 - 5.81 MIL/uL   Hemoglobin 10.1 (L) 13.0 - 17.0 g/dL   HCT 29.2 (L) 39.0 - 52.0 %   MCV 84.1 78.0 - 100.0 fL   MCH 29.1 26.0 - 34.0 pg   MCHC 34.6 30.0 - 36.0 g/dL   RDW 13.9 11.5 - 15.5 %   Platelets 226 150 - 400 K/uL  Troponin I     Status: Abnormal   Collection Time: 07/19/15  2:18 PM  Result Value Ref Range   Troponin I 1.08 (HH) <  0.031 ng/mL  Glucose, capillary     Status: Abnormal   Collection Time: 07/19/15  3:53 PM  Result Value Ref Range   Glucose-Capillary 136 (H) 65 - 99 mg/dL  MRSA PCR Screening     Status: None   Collection Time: 07/19/15  4:12 PM  Result Value Ref Range   MRSA by PCR NEGATIVE NEGATIVE  Procalcitonin     Status: None   Collection Time: 07/19/15  4:25 PM  Result Value Ref Range   Procalcitonin 29.52 ng/mL  Protime-INR     Status: Abnormal   Collection Time: 07/19/15  4:25 PM  Result Value Ref Range   Prothrombin Time 18.3 (H) 11.6 - 15.2 seconds   INR 1.51 (H) 0.00 - 1.49  APTT     Status: Abnormal   Collection Time: 07/19/15  4:25 PM  Result Value Ref Range   aPTT 40 (H) 24 - 37 seconds  Glucose, capillary     Status: None   Collection Time: 07/19/15  9:18 PM  Result Value Ref Range   Glucose-Capillary 70 65 - 99 mg/dL  Troponin I     Status: Abnormal   Collection Time: 07/19/15  9:45 PM  Result Value Ref Range   Troponin I 0.23 (H) <0.031 ng/mL  Glucose, capillary     Status: Abnormal   Collection Time: 07/20/15  1:56 AM  Result Value Ref Range   Glucose-Capillary 142 (H) 65 - 99 mg/dL    Glucose, capillary     Status: Abnormal   Collection Time: 07/20/15  4:00 AM  Result Value Ref Range   Glucose-Capillary 123 (H) 65 - 99 mg/dL  Troponin I     Status: Abnormal   Collection Time: 07/20/15  5:47 AM  Result Value Ref Range   Troponin I 0.32 (H) <0.031 ng/mL  Comprehensive metabolic panel     Status: Abnormal   Collection Time: 07/20/15  5:47 AM  Result Value Ref Range   Sodium 139 135 - 145 mmol/L   Potassium 3.3 (L) 3.5 - 5.1 mmol/L   Chloride 113 (H) 101 - 111 mmol/L   CO2 19 (L) 22 - 32 mmol/L   Glucose, Bld 79 65 - 99 mg/dL   BUN 31 (H) 6 - 20 mg/dL   Creatinine, Ser 1.93 (H) 0.61 - 1.24 mg/dL   Calcium 7.9 (L) 8.9 - 10.3 mg/dL   Total Protein 5.4 (L) 6.5 - 8.1 g/dL   Albumin 2.2 (L) 3.5 - 5.0 g/dL   AST 179 (H) 15 - 41 U/L   ALT 142 (H) 17 - 63 U/L   Alkaline Phosphatase 60 38 - 126 U/L   Total Bilirubin 0.7 0.3 - 1.2 mg/dL   GFR calc non Af Amer 31 (L) >60 mL/min   GFR calc Af Amer 36 (L) >60 mL/min   Anion gap 7 5 - 15  CBC     Status: Abnormal   Collection Time: 07/20/15  5:47 AM  Result Value Ref Range   WBC 18.1 (H) 4.0 - 10.5 K/uL   RBC 3.11 (L) 4.22 - 5.81 MIL/uL   Hemoglobin 9.1 (L) 13.0 - 17.0 g/dL   HCT 25.8 (L) 39.0 - 52.0 %   MCV 83.0 78.0 - 100.0 fL   MCH 29.3 26.0 - 34.0 pg   MCHC 35.3 30.0 - 36.0 g/dL   RDW 13.9 11.5 - 15.5 %   Platelets 223 150 - 400 K/uL  Glucose, capillary     Status: None   Collection  Time: 07/20/15  8:14 AM  Result Value Ref Range   Glucose-Capillary 98 65 - 99 mg/dL    Intake/Output Summary (Last 24 hours) at 07/20/15 0910 Last data filed at 07/20/15 0700  Gross per 24 hour  Intake 7410.97 ml  Output    251 ml  Net 7159.97 ml    EKG:   Sinus rhythm rate 99 bpm.    Echo: pending  ASSESSMENT AND PLAN:  # Sepsis: Blood and urine cultures negative thus far.  Source unknown.  He is very wheezy on exam and has a mildly productive cough.  However, CXR was unremarkable.  Antibiotics, fluids and evaluation  per primary team.  # Elevated troponin: Appears to be due to demand ischemia.  He has no chest pain and preliminarily, echo shows vigorous contraction with no wall motion abnormalities. - Agree with aspirin.  Would not start heparin - Outpatient follow up for consideration of stress testing - No statin 2/2 transaminitis  # Acute on chronic renal failure: Kidney function improved with IV fluids.  He has normal LVEF and is not volume overloaded on exam.  Would continue IV fluids.  # Hypertension: Home BP meds held 2/2 hypotension and AKI.  Principal Problem:   Sepsis ([place]) Active Problems:   Hypertension goal BP (blood pressure) < 140/90   Hyperlipidemia LDL goal <100   Chronic kidney disease (CKD), stage III (moderate)   Anemia of chronic renal failure   Acute renal failure (ARF) (HCC)   Transaminitis   Diabetes mellitus, insulin dependent (IDDM), uncontrolled ([place])   Leukocytosis    Sharol Harness, MD 07/20/2015 9:10 AM

## 2015-07-21 ENCOUNTER — Inpatient Hospital Stay (HOSPITAL_COMMUNITY): Payer: Medicare Other

## 2015-07-21 DIAGNOSIS — K750 Abscess of liver: Secondary | ICD-10-CM

## 2015-07-21 DIAGNOSIS — R7881 Bacteremia: Secondary | ICD-10-CM

## 2015-07-21 DIAGNOSIS — R509 Fever, unspecified: Secondary | ICD-10-CM | POA: Insufficient documentation

## 2015-07-21 LAB — CBC
HEMATOCRIT: 26.3 % — AB (ref 39.0–52.0)
HEMOGLOBIN: 9.2 g/dL — AB (ref 13.0–17.0)
MCH: 29.1 pg (ref 26.0–34.0)
MCHC: 35 g/dL (ref 30.0–36.0)
MCV: 83.2 fL (ref 78.0–100.0)
Platelets: 255 10*3/uL (ref 150–400)
RBC: 3.16 MIL/uL — AB (ref 4.22–5.81)
RDW: 14.3 % (ref 11.5–15.5)
WBC: 18.3 10*3/uL — AB (ref 4.0–10.5)

## 2015-07-21 LAB — GLUCOSE, CAPILLARY
GLUCOSE-CAPILLARY: 173 mg/dL — AB (ref 65–99)
GLUCOSE-CAPILLARY: 172 mg/dL — AB (ref 65–99)
GLUCOSE-CAPILLARY: 166 mg/dL — AB (ref 65–99)
Glucose-Capillary: 176 mg/dL — ABNORMAL HIGH (ref 65–99)
Glucose-Capillary: 156 mg/dL — ABNORMAL HIGH (ref 65–99)

## 2015-07-21 LAB — BASIC METABOLIC PANEL
ANION GAP: 8 (ref 5–15)
BUN: 25 mg/dL — AB (ref 6–20)
CHLORIDE: 113 mmol/L — AB (ref 101–111)
CO2: 18 mmol/L — ABNORMAL LOW (ref 22–32)
Calcium: 7.8 mg/dL — ABNORMAL LOW (ref 8.9–10.3)
Creatinine, Ser: 1.54 mg/dL — ABNORMAL HIGH (ref 0.61–1.24)
GFR calc non Af Amer: 41 mL/min — ABNORMAL LOW (ref >60)
GFR, EST AFRICAN AMERICAN: 48 mL/min — AB (ref >60)
Glucose, Bld: 124 mg/dL — ABNORMAL HIGH (ref 65–99)
POTASSIUM: 3.6 mmol/L (ref 3.5–5.1)
SODIUM: 139 mmol/L (ref 135–145)

## 2015-07-21 LAB — APTT: APTT: 41 s — AB (ref 24–37)

## 2015-07-21 LAB — PROTIME-INR
INR: 1.42 (ref 0.00–1.49)
Prothrombin Time: 17.5 seconds — ABNORMAL HIGH (ref 11.6–15.2)

## 2015-07-21 MED ORDER — MIDAZOLAM HCL 2 MG/2ML IJ SOLN
INTRAMUSCULAR | Status: AC
  Filled 2015-07-21: qty 2

## 2015-07-21 MED ORDER — INSULIN ASPART 100 UNIT/ML ~~LOC~~ SOLN: 0.0000 [IU] | Freq: Three times a day (TID) | SUBCUTANEOUS | Status: DC

## 2015-07-21 MED ORDER — LIDOCAINE HCL (PF) 1 % IJ SOLN
INTRAMUSCULAR | Status: AC
  Filled 2015-07-21: qty 10

## 2015-07-21 MED ORDER — FENTANYL CITRATE (PF) 100 MCG/2ML IJ SOLN
INTRAMUSCULAR | Status: AC | PRN
  Administered 2015-07-21: 50 ug via INTRAVENOUS

## 2015-07-21 MED ORDER — ENOXAPARIN SODIUM 40 MG/0.4ML ~~LOC~~ SOLN
40.0000 mg | SUBCUTANEOUS | Status: DC
  Administered 2015-07-21 – 2015-07-29 (×9): 40 mg via SUBCUTANEOUS
  Filled 2015-07-21 (×8): qty 0.4

## 2015-07-21 MED ORDER — OXYCODONE HCL 5 MG PO TABS
5.0000 mg | ORAL_TABLET | Freq: Four times a day (QID) | ORAL | Status: DC | PRN
  Administered 2015-07-21 – 2015-08-02 (×4): 5 mg via ORAL
  Filled 2015-07-21 (×5): qty 1

## 2015-07-21 MED ORDER — MIDAZOLAM HCL 2 MG/2ML IJ SOLN
INTRAMUSCULAR | Status: AC | PRN
  Administered 2015-07-21: 1 mg via INTRAVENOUS

## 2015-07-21 MED ORDER — FENTANYL CITRATE (PF) 100 MCG/2ML IJ SOLN
INTRAMUSCULAR | Status: AC
  Filled 2015-07-21: qty 2

## 2015-07-21 MED ORDER — MORPHINE SULFATE (PF) 2 MG/ML IV SOLN
1.0000 mg | INTRAVENOUS | Status: DC | PRN
  Administered 2015-07-21 – 2015-07-24 (×9): 1 mg via INTRAVENOUS
  Filled 2015-07-21 (×9): qty 1

## 2015-07-21 NOTE — Sedation Documentation (Signed)
Patient denies pain and is resting comfortably.  

## 2015-07-21 NOTE — Progress Notes (Addendum)
Pt has audible wheezing Given PRN Xopenex resp treatment as hand held neb. Decrease wheezing post treatment Family and pt asked they all say he does not  use inhalers at home

## 2015-07-21 NOTE — Progress Notes (Signed)
[facility] for Infectious Disease    Date of Admission:  07/18/2015   Total days of antibiotics 4        Day 2 ceftaz        Day 3 doxy        Day 2 metro   ID: Brian [name] is a 81 y.o. male with sepsis due to GN bacteremia and liver abscess Principal Problem:   Sepsis ([place]) Active Problems:   Hypertension goal BP (blood pressure) < 140/90   Hyperlipidemia LDL goal <100   Chronic kidney disease (CKD), stage III (moderate)   Anemia of chronic renal failure   Acute renal failure (ARF) (HCC)   Transaminitis   Diabetes mellitus, insulin dependent (IDDM), uncontrolled (HCC)   Leukocytosis   Acute kidney injury ([place])   SOB (shortness of breath)   Liver abscess    Subjective: Fever curve trending down but tmax of 101.5 yesterday. Still waiting for IR procedure  Interval hx: had MRI showing complex liver abscess yesterday afternoon. Received albuterol treatmetn this morning Medications:  . aspirin  81 mg Oral Daily  . cefTAZidime (FORTAZ)  IV  1 g Intravenous Q12H  . docusate sodium  100 mg Oral BID  . doxycycline (VIBRAMYCIN) IV  100 mg Intravenous Q12H  . enoxaparin (LOVENOX) injection  30 mg Subcutaneous Q24H  . finasteride  5 mg Oral Daily  . insulin aspart  0-15 Units Subcutaneous 6 times per day  . insulin detemir  8 Units Subcutaneous Daily  . metronidazole  500 mg Intravenous Q8H  . sodium chloride  3 mL Intravenous Q12H  . tamsulosin  0.4 mg Oral Daily    Objective: Vital signs in last 24 hours: Temp:  [99.2 F (37.3 C)-101.5 F (38.6 C)] 99.2 F (37.3 C) (11/26 0815) Pulse Rate:  [104-122] 122 (11/26 0815) Resp:  [18-39] 27 (11/26 0815) BP: (112-147)/(65-109) 144/84 mmHg (11/26 0815) SpO2:  [99 %-100 %] 100 % (11/26 0815) Physical Exam  Constitutional: sleeping. He appears well-developed and well-nourished. No distress.  HENT:  Mouth/Throat: Oropharynx is clear and moist. No oropharyngeal exudate.  Cardiovascular: Normal rate, regular rhythm  and normal heart sounds. Exam reveals no gallop and no friction rub.  No murmur heard.  Pulmonary/Chest: Effort normal and breath sounds normal. No respiratory distress. He has no wheezes.  Abdominal: midly distended, decreased bowel sounds.  There is no tenderness.  Skin: Skin is warm and dry. No rash noted. No erythema.    Lab Results  Recent Labs  07/20/15 0547 07/21/15 0256  WBC 18.1* 18.3*  HGB 9.1* 9.2*  HCT 25.8* 26.3*  NA 139 139  K 3.3* 3.6  CL 113* 113*  CO2 19* 18*  BUN 31* 25*  CREATININE 1.93* 1.54*   Liver Panel  Recent Labs  07/19/15 1418 07/20/15 0547  PROT 6.7 5.4*  ALBUMIN 2.6* 2.2*  AST 211* 179*  ALT 150* 142*  ALKPHOS 79 60  BILITOT 0.8 0.7   Microbiology: 11/24 blood cx ngtd 11/23 blood cx in anaerobic cx [facility] Studies/Results: Ct Abdomen Pelvis Wo Contrast  07/20/2015  CLINICAL DATA:  Acute onset of renal failure. Severe sepsis. Hypotension. Initial encounter. EXAM: CT ABDOMEN AND PELVIS WITHOUT CONTRAST TECHNIQUE: Multidetector CT imaging of the abdomen and pelvis was performed following the standard protocol without IV contrast. COMPARISON:  Abdominal ultrasound performed 07/18/2015, and MRI of the abdomen performed 12/28/2013. CT of the abdomen from 11/28/2013 FINDINGS: A trace right-sided pleural effusion is noted. There is  an unusual heterogeneously hypoattenuating lesion within the posterior right hepatic lobe, measuring 7.1 x 5.0 x 5.7 cm, new from 2015. Malignancy cannot be excluded, though given the patient's sepsis, underlying abscess might have a similar appearance. The gallbladder is within normal limits. The pancreas and adrenal glands are unremarkable. Mild left-sided perinephric stranding is noted, with trace fluid tracking about Gerota's fascia on the left. Mild right-sided perinephric stranding is also seen. A few nonobstructing bilateral renal stones are seen, measuring up to 5 mm in size. There is no evidence of hydronephrosis. No  obstructing ureteral stones are identified. Small bowel loops measure up to 3.0 cm in diameter, borderline normal, without definite evidence for bowel obstruction. The stomach is within normal limits. No acute vascular abnormalities are seen. Relatively diffuse calcification is noted along the abdominal aorta and its branches. The appendix is normal in caliber, without evidence of appendicitis. Scattered diverticulosis is noted along the ascending, descending and proximal sigmoid colon, without evidence of diverticulitis. The bladder is mildly distended and grossly unremarkable in appearance. The prostate remains normal in size. A small to moderate amount of free fluid within the pelvis is nonspecific and may be physiologic in nature. No inguinal lymphadenopathy is seen. No acute osseous abnormalities are identified. IMPRESSION: 1. Unusual large heterogeneous hypoattenuating lesion within the posterior right hepatic lobe, measuring 7.1 x 5.0 x 5.7 cm, new from 2015. Primary hepatic malignancy is a concern, though given the patient's sepsis and on this noncontrast study, underlying abscess might have a similar appearance. Would correlate with LFTs. Dynamic liver protocol MRI or CT is recommended for further evaluation. 2. New left-sided perinephric stranding and fluid tracking about Gerota's fascia raises question for mild left-sided pyelonephritis, given the patient's symptoms. Would correlate with urinalysis results. 3. Small to moderate amount of free fluid within the pelvis is nonspecific and may be physiologic in nature, or could reflect one of the processes above. 4. Few nonobstructing bilateral renal stones measure up to 5 mm in size. 5. Relatively diffuse calcification along the abdominal aorta and branches. 6. Scattered diverticulosis along the ascending, descending and proximal sigmoid colon, without evidence of diverticulitis. 7. Trace right-sided pleural effusion noted. Electronically Signed   By: Garald Balding M.D.   On: 07/20/2015 07:08   Mr Liver W Wo Contrast  07/20/2015  CLINICAL DATA:  81 year old male inpatient with sepsis and new right liver mass on noncontrast CT worrisome for liver abscess. EXAM: MRI ABDOMEN WITHOUT AND WITH CONTRAST TECHNIQUE: Multiplanar multisequence MR imaging of the abdomen was performed both before and after the administration of intravenous contrast. CONTRAST:  20mL MULTIHANCE GADOBENATE DIMEGLUMINE 529 MG/ML IV SOLN COMPARISON:  Unenhanced CT abdomen/ pelvis from earlier today. MRI abdomen from 12/28/2013. FINDINGS: Most of the MRI sequences are significantly motion degraded. Lower chest: Small right and trace left layering pleural effusions. Hepatobiliary: There is a complex 8.6 x 5.8 x 7.9 cm multilocular cystic liver mass centered at the junction of segments 6 and 7 of the right liver lobe, which demonstrates multiple thick irregular enhancing internal septations and irregular peripheral hyperenhancement and restricted diffusion, which was not detected on the 07/18/2015 abdominal sonogram from 2 days prior, and is new since the 12/28/2013 MRI study. No additional liver masses. There is mild diffuse hepatic steatosis on chemical shift imaging. Normal gallbladder with no cholelithiasis. No biliary ductal dilatation. Common bile duct diameter 4 mm. No choledocholithiasis. Pancreas: No pancreatic mass or duct dilation. No evidence of pancreas divisum. Spleen: Normal size. No mass.  Adrenals/Urinary Tract: Normal right adrenal. Stable 1.1 cm left adrenal adenoma. No hydronephrosis. There are stable small parapelvic simple renal cysts in the lower renal sinus bilaterally. Stable Bosniak category 2 nonenhancing T1 hyperintense hemorrhagic/proteinaceous renal cyst in the lateral upper left kidney. Additional smaller simple renal cysts are present in both kidneys. Stomach/Bowel: Grossly normal stomach. Visualized small and large bowel is normal caliber, with no bowel wall thickening.  Vascular/Lymphatic: Normal caliber abdominal aorta. Patent portal, splenic, hepatic and renal veins. No pathologically enlarged lymph nodes in the abdomen. Other: No abdominal ascites or focal fluid collection. Musculoskeletal: No aggressive appearing focal osseous lesions. IMPRESSION: 1. Complex 8.6 x 5.8 x 7.9 cm multilocular cystic liver mass centered at the junction of segments 6 and 7 in the right liver lobe, with thick irregular enhancing internal septations, irregular peripheral hyperenhancement and restricted diffusion. Given the clinical setting of sepsis, and given that this liver mass appears new since 12/28/2013 and possibly even new since the abdominal sonogram from 2 days prior, this liver mass is favored to represent an evolving liver abscess. A cystic primary liver neoplasm is less likely but not entirely excluded. 2. Small right and trace left layering pleural effusions. 3. Stable small left adrenal adenoma. 4. Stable small benign Bosniak category 1 and category 2 renal cysts. Electronically Signed   By: Ilona Sorrel M.D.   On: 07/20/2015 13:28   Dg Chest Port 1 View  07/19/2015  CLINICAL DATA:  Shortness of breath.  Fever. EXAM: PORTABLE CHEST 1 VIEW 1:54 p.m. COMPARISON:  07/19/2015 6:21 a.m. and 07/18/2015 FINDINGS: The heart size and mediastinal contours are within normal limits. Both lungs are clear. The visualized skeletal structures are unremarkable. IMPRESSION: No active disease. Electronically Signed   By: Lorriane Shire M.D.   On: 07/19/2015 14:00     Assessment/Plan: Liver abscess =agree with plan for IR drain, please send fluid for culture, continue with empiric coverage with ceftaz, metronidazole and doxy due to possible vibrio exposure  Gram negative bacteremia = repeat cx are NGTD. Continue with broad spectrum and will narrow once plates are ready in micro tomorrow. Currently only growing in anaerobic plates, which can be slow growing.  Leukocytosis = likely still  elevated due to no source containment, anticipate it will trend down once abscess is drained  [facility] Fl[facility] for Infectious Diseases Cell: [phone] Pager: [phone]  07/21/2015, 9:54 AM

## 2015-07-21 NOTE — Procedures (Signed)
Interventional Radiology Procedure Note  Procedure: Placement of a 69F transhepatic drain into liver abscess.  Aspiration yields 15 mL bloody, purulent fluid.   Complications: None immediate  Estimated Blood Loss: <25 mL  Recommendations:  - Drain to JP bulb - Cultures pending - Repeat CT abdomen with contrast prior to tube removal - IR Drain clinic [phone] for out-pt follow up.  Signed,  Criselda Peaches, MD

## 2015-07-21 NOTE — Progress Notes (Signed)
[facility] PROGRESS NOTE  Brian [name] [id number] DOB: 06-Sep-1933 DOA: 07/18/2015 PCP: Brian Lime, MD  Assessment/Plan: 1. Severe sepsis -Present on admission, evidenced by a temperature of 103, heart rate of 123, acute kidney injury, hypotension with systolic blood pressures in the 80s to 90s (having a history of hypertension), with elevated transaminases. -His source of infection is unclear, as workup thus far has included abdominal ultrasound, chest x-ray, urinalysis which was non-revealing. -He had reported eating oysters earlier this week after which he developed multiple episodes of nausea and vomiting that was associated with diarrhea. This history makes Vibrio parahaemolyticus a concern, particularly since this organism can cause septicemia.  -Will provide empiric coverage for Vibrio parahaemolyticus with doxycycline 100 mg IV twice a day and ceftriaxone 1 g IV every 24 hours until blood culture data is available. -On 07/20/2015: CT scan of abdomen and pelvis performed revealing 7.1 x 5.0 x 5.7 cm hypoattenuating lesion within the liver. Radiology reporting that this could represent an abscess or perhaps primary hepatic malignancy. Plan to further work this up with MRI of liver.  -If this proves to be an abscess will plan to consult IR for CT guided drainage.  -CT scan also revealed left-sided perinephritic stranding raising the possibility of pyelonephritis. Urinalysis that was collected on 07/18/2015 however revealed absence of nitrates and leukocyte esterase, which would be unusual for pyelo.  -Blood cultures drawn on admission growing a gram negative coccobacilli, which would be consistent with Vibrio parahaemolyticus.  -On 07/21/2015 blood cultures drawn from 07/18/2015 growing 1/2 gram-negative rods, identification and susceptibility testing are still in progress. Interventional radiology has been consulted for CT-guided drainage of liver abscess. Procedure planned  for today. Infectious disease was consulted case discussed with [name], ceftriaxone changed to [facility] 1 g IV every 12 hours, remains on doxycycline. Vancomycin discontinued.    2.  Acute on chronic renal failure -Likely secondary to ATN in setting of hypotension or perhaps sepsis/critical illness -Previous labs performed earlier this year revealed creatinine between 1.4 1.7, initial lab work during this hospitalization revealed creatinine of 2.85 with BUN of 36.this improved with IV fluid administration, currently coming down to 2.36 on a.m. Lab work. -On 07/20/2015 creatinine trending down to 1.9 from 2.24, continue IV fluids -On 07/21/2015 lab work showing ongoing improvement to kidney function, creatinine trending down to 1.54 from 1.93 on 07/20/2015. We'll continue IV fluid resuscitation with normal saline   3.  Elevated transaminases -Lab work showing AST of 125 with ALT of 9.0. His total bilirubin was 1.1 and alkaline phosphatase 51. Abdominal ultrasound was unrevealing -Further workup with CT scan of abdomen and pelvis without contrast showed a hypoattenuating lesion within the liver which could represent abscess. -Plan for CT guided drainage of liver abscess follow-up on LFTs in a.m.   4.  Elevated troponin. -Lab work showing upward trend in his troponin to 0.30 -He presently denies chest pain. -I suspect related to demand ischemia in setting of critical illness -Troponins trending down.  5. Diabetes mellitus -Will perform Accu-Cheks every 4 hours with sliding scale coverage, continue Levemir -He presented with hyperglycemia likely caused by infection  -Stopping oral hypoglycemic medications, as patient presents with acute kidney injury.   6.  History of hypertension. -He presented with hypotension for which antihypertensive medications were discontinued  7.  DVT prophylaxis. Lovenox  Code Status: full code Family Communication: spoke to his son and daughter who were present  at bedside Disposition Plan: Will continue close monitoring in the  stepdown unit   Antibiotics:  Vancomycin started on 07/18/2015 stopped on 07/20/2015  Zosyn discontinued on 07/19/2015  Ceftriaxone started on 07/19/2015 stopped on 07/20/2015   Doxycycline started on 07/19/2015   [name] started on 07/20/2015  HPI/Subjective: Brian [name] is a 81 year old gentleman with a past medical history of diabetes mellitus, admitted to the medicine service on 07/18/2015 when he presented with complaints of generalized weakness, dizziness, chills that was social with hyperglycemia. He reports having several episodes of nausea and vomiting earlier this week after having oysters. Initial lab work revealed acute on chronic kidney injury having creatinine of 2.85 with BUN of 36. He was found to be febrile having a temperature of 103 and started on broad-spectrum IV antimicrobial therapy with vancomycin and Zosyn. Patient also remained hypotensive overnight with systolic blood pressures in the 80s to 90s.workup revealed an abdominal ultrasoundwhich did not reveal acute intra-abdominal pathology. Lab work did reveal elevated transaminases with AST of 125 and ALT of 90.other workup included a chest x-ray and urinalysis which was unremarkable. Patient was transferred to the stepdown unit on 07/19/2015 for closer monitoring.  Objective: Filed Vitals:   07/21/15 0600 07/21/15 0815  BP: 123/98 144/84  Pulse:  122  Temp: 99.9 F (37.7 C) 99.2 F (37.3 C)  Resp: 28 27    Intake/Output Summary (Last 24 hours) at 07/21/15 1134 Last data filed at 07/21/15 0815  Gross per 24 hour  Intake   2965 ml  Output   1141 ml  Net   1824 ml   Filed Weights   07/18/15 1223 07/19/15 0515 07/19/15 1531  Weight: 75.5 kg (166 lb 7.2 oz) 76.114 kg (167 lb 12.8 oz) 79.3 kg (174 lb 13.2 oz)    Exam:   General: Patient seems mildly confused this morning,  he was awake and alert able to follow commands.  Cardiovascular:  regular rate and rhythm normal S1-S2  Respiratory: patient having bilateral expiratory wheezes, normal respiratory effort  Abdomen: on abdominal exam he did not have significant pain with palpation across generalized abdomen, bowel sounds were present, overall nonfocal.  Musculoskeletal: no edema  Data Reviewed: Basic Metabolic Panel:  Recent Labs Lab 07/18/15 0830 07/19/15 0025 07/19/15 1418 07/20/15 0547 07/21/15 0256  NA 133* 135 139 139 139  K 4.0 3.5 4.1 3.3* 3.6  CL 102 107 109 113* 113*  CO2 18* 19* 21* 19* 18*  GLUCOSE 291* 195* 161* 79 124*  BUN 36* 39* 41* 31* 25*  CREATININE 2.85* 2.36* 2.24* 1.93* 1.54*  CALCIUM 8.9 8.3* 8.4* 7.9* 7.8*   Liver Function Tests:  Recent Labs Lab 07/18/15 0830 07/19/15 0025 07/19/15 1418 07/20/15 0547  AST 101* 125* 211* 179*  ALT 67* 90* 150* 142*  ALKPHOS 34* 51 79 60  BILITOT 1.4* 1.1 0.8 0.7  PROT 7.0 5.7* 6.7 5.4*  ALBUMIN 3.0* 2.4* 2.6* 2.2*   No results for input(s): LIPASE, AMYLASE in the last 168 hours. No results for input(s): AMMONIA in the last 168 hours. CBC:  Recent Labs Lab 07/18/15 0830 07/19/15 0025 07/19/15 1418 07/20/15 0547 07/21/15 0256  WBC 16.0* 11.2* 16.6* 18.1* 18.3*  NEUTROABS 12.4*  --   --   --   --   HGB 10.0* 8.8* 10.1* 9.1* 9.2*  HCT 29.2* 25.3* 29.2* 25.8* 26.3*  MCV 85.6 83.8 84.1 83.0 83.2  PLT 237 194 226 223 255   Cardiac Enzymes:  Recent Labs Lab 07/18/15 1818 07/19/15 0025 07/19/15 1418 07/19/15 2145 07/20/15 0547  TROPONINI 0.15*  0.30* 1.08* 0.23* 0.32*   BNP (last 3 results) No results for input(s): BNP in the last 8760 hours.  ProBNP (last 3 results) No results for input(s): PROBNP in the last 8760 hours.  CBG:  Recent Labs Lab 07/20/15 1259 07/20/15 1648 07/20/15 2025 07/20/15 2344 07/21/15 0812  GLUCAP 99 184* 194* 159* 173*    Recent Results (from the past 240 hour(s))  Urine culture     Status: None   Collection Time: 07/18/15 12:03 PM   Result Value Ref Range Status   Specimen Description URINE, CLEAN CATCH  Final   Special Requests NONE  Final   Culture MULTIPLE SPECIES PRESENT, SUGGEST RECOLLECTION  Final   Report Status 07/19/2015 FINAL  Final  Culture, blood (routine x 2)     Status: None (Preliminary result)   Collection Time: 07/18/15 12:50 PM  Result Value Ref Range Status   Specimen Description BLOOD RIGHT ANTECUBITAL  Final   Special Requests BOTTLES DRAWN AEROBIC AND ANAEROBIC 10CC  Final   Culture  Setup Time   Final    GRAM NEGATIVE COCCOBACILLI ANAEROBIC BOTTLE ONLY CRITICAL RESULT CALLED TO, READ BACK BY AND VERIFIED WITH: A. DAVIS,RN AT 1157 ON [id number] BY S. YARBROUGH    Culture HOLDING FOR POSSIBLE ANAEROBE  Final   Report Status PENDING  Incomplete  Culture, blood (routine x 2)     Status: None (Preliminary result)   Collection Time: 07/18/15 12:53 PM  Result Value Ref Range Status   Specimen Description BLOOD LEFT HAND  Final   Special Requests BOTTLES DRAWN AEROBIC ONLY 3CC  Final   Culture NO GROWTH 2 DAYS  Final   Report Status PENDING  Incomplete  Culture, blood (routine x 2)     Status: None (Preliminary result)   Collection Time: 07/19/15  2:10 PM  Result Value Ref Range Status   Specimen Description BLOOD RIGHT ANTECUBITAL  Final   Special Requests BOTTLES DRAWN AEROBIC ONLY  10CC  Final   Culture NO GROWTH < 24 HOURS  Final   Report Status PENDING  Incomplete  Culture, blood (routine x 2)     Status: None (Preliminary result)   Collection Time: 07/19/15  2:18 PM  Result Value Ref Range Status   Specimen Description BLOOD RIGHT HAND  Final   Special Requests IN PEDIATRIC BOTTLE  3CC  Final   Culture NO GROWTH < 24 HOURS  Final   Report Status PENDING  Incomplete  MRSA PCR Screening     Status: None   Collection Time: 07/19/15  4:12 PM  Result Value Ref Range Status   MRSA by PCR NEGATIVE NEGATIVE Final    Comment:        The GeneXpert MRSA Assay (FDA approved for NASAL  specimens only), is one component of a comprehensive MRSA colonization surveillance program. It is not intended to diagnose MRSA infection nor to guide or monitor treatment for MRSA infections.      Studies: Ct Abdomen Pelvis Wo Contrast  07/20/2015  CLINICAL DATA:  Acute onset of renal failure. Severe sepsis. Hypotension. Initial encounter. EXAM: CT ABDOMEN AND PELVIS WITHOUT CONTRAST TECHNIQUE: Multidetector CT imaging of the abdomen and pelvis was performed following the standard protocol without IV contrast. COMPARISON:  Abdominal ultrasound performed 07/18/2015, and MRI of the abdomen performed 12/28/2013. CT of the abdomen from 11/28/2013 FINDINGS: A trace right-sided pleural effusion is noted. There is an unusual heterogeneously hypoattenuating lesion within the posterior right hepatic lobe, measuring 7.1 x 5.0 x 5.7  cm, new from 2015. Malignancy cannot be excluded, though given the patient's sepsis, underlying abscess might have a similar appearance. The gallbladder is within normal limits. The pancreas and adrenal glands are unremarkable. Mild left-sided perinephric stranding is noted, with trace fluid tracking about Gerota's fascia on the left. Mild right-sided perinephric stranding is also seen. A few nonobstructing bilateral renal stones are seen, measuring up to 5 mm in size. There is no evidence of hydronephrosis. No obstructing ureteral stones are identified. Small bowel loops measure up to 3.0 cm in diameter, borderline normal, without definite evidence for bowel obstruction. The stomach is within normal limits. No acute vascular abnormalities are seen. Relatively diffuse calcification is noted along the abdominal aorta and its branches. The appendix is normal in caliber, without evidence of appendicitis. Scattered diverticulosis is noted along the ascending, descending and proximal sigmoid colon, without evidence of diverticulitis. The bladder is mildly distended and grossly  unremarkable in appearance. The prostate remains normal in size. A small to moderate amount of free fluid within the pelvis is nonspecific and may be physiologic in nature. No inguinal lymphadenopathy is seen. No acute osseous abnormalities are identified. IMPRESSION: 1. Unusual large heterogeneous hypoattenuating lesion within the posterior right hepatic lobe, measuring 7.1 x 5.0 x 5.7 cm, new from 2015. Primary hepatic malignancy is a concern, though given the patient's sepsis and on this noncontrast study, underlying abscess might have a similar appearance. Would correlate with LFTs. Dynamic liver protocol MRI or CT is recommended for further evaluation. 2. New left-sided perinephric stranding and fluid tracking about Gerota's fascia raises question for mild left-sided pyelonephritis, given the patient's symptoms. Would correlate with urinalysis results. 3. Small to moderate amount of free fluid within the pelvis is nonspecific and may be physiologic in nature, or could reflect one of the processes above. 4. Few nonobstructing bilateral renal stones measure up to 5 mm in size. 5. Relatively diffuse calcification along the abdominal aorta and branches. 6. Scattered diverticulosis along the ascending, descending and proximal sigmoid colon, without evidence of diverticulitis. 7. Trace right-sided pleural effusion noted. Electronically Signed   By: Garald Balding M.D.   On: 07/20/2015 07:08   Mr Liver W Wo Contrast  07/20/2015  CLINICAL DATA:  81 year old male inpatient with sepsis and new right liver mass on noncontrast CT worrisome for liver abscess. EXAM: MRI ABDOMEN WITHOUT AND WITH CONTRAST TECHNIQUE: Multiplanar multisequence MR imaging of the abdomen was performed both before and after the administration of intravenous contrast. CONTRAST:  24mL MULTIHANCE GADOBENATE DIMEGLUMINE 529 MG/ML IV SOLN COMPARISON:  Unenhanced CT abdomen/ pelvis from earlier today. MRI abdomen from 12/28/2013. FINDINGS: Most of the  MRI sequences are significantly motion degraded. Lower chest: Small right and trace left layering pleural effusions. Hepatobiliary: There is a complex 8.6 x 5.8 x 7.9 cm multilocular cystic liver mass centered at the junction of segments 6 and 7 of the right liver lobe, which demonstrates multiple thick irregular enhancing internal septations and irregular peripheral hyperenhancement and restricted diffusion, which was not detected on the 07/18/2015 abdominal sonogram from 2 days prior, and is new since the 12/28/2013 MRI study. No additional liver masses. There is mild diffuse hepatic steatosis on chemical shift imaging. Normal gallbladder with no cholelithiasis. No biliary ductal dilatation. Common bile duct diameter 4 mm. No choledocholithiasis. Pancreas: No pancreatic mass or duct dilation. No evidence of pancreas divisum. Spleen: Normal size. No mass. Adrenals/Urinary Tract: Normal right adrenal. Stable 1.1 cm left adrenal adenoma. No hydronephrosis. There are stable small  parapelvic simple renal cysts in the lower renal sinus bilaterally. Stable Bosniak category 2 nonenhancing T1 hyperintense hemorrhagic/proteinaceous renal cyst in the lateral upper left kidney. Additional smaller simple renal cysts are present in both kidneys. Stomach/Bowel: Grossly normal stomach. Visualized small and large bowel is normal caliber, with no bowel wall thickening. Vascular/Lymphatic: Normal caliber abdominal aorta. Patent portal, splenic, hepatic and renal veins. No pathologically enlarged lymph nodes in the abdomen. Other: No abdominal ascites or focal fluid collection. Musculoskeletal: No aggressive appearing focal osseous lesions. IMPRESSION: 1. Complex 8.6 x 5.8 x 7.9 cm multilocular cystic liver mass centered at the junction of segments 6 and 7 in the right liver lobe, with thick irregular enhancing internal septations, irregular peripheral hyperenhancement and restricted diffusion. Given the clinical setting of sepsis,  and given that this liver mass appears new since 12/28/2013 and possibly even new since the abdominal sonogram from 2 days prior, this liver mass is favored to represent an evolving liver abscess. A cystic primary liver neoplasm is less likely but not entirely excluded. 2. Small right and trace left layering pleural effusions. 3. Stable small left adrenal adenoma. 4. Stable small benign Bosniak category 1 and category 2 renal cysts. Electronically Signed   By: Ilona Sorrel M.D.   On: 07/20/2015 13:28   Dg Chest Port 1 View  07/19/2015  CLINICAL DATA:  Shortness of breath.  Fever. EXAM: PORTABLE CHEST 1 VIEW 1:54 p.m. COMPARISON:  07/19/2015 6:21 a.m. and 07/18/2015 FINDINGS: The heart size and mediastinal contours are within normal limits. Both lungs are clear. The visualized skeletal structures are unremarkable. IMPRESSION: No active disease. Electronically Signed   By: Lorriane Shire M.D.   On: 07/19/2015 14:00    Scheduled Meds: . aspirin  81 mg Oral Daily  . cefTAZidime (FORTAZ)  IV  1 g Intravenous Q12H  . docusate sodium  100 mg Oral BID  . doxycycline (VIBRAMYCIN) IV  100 mg Intravenous Q12H  . enoxaparin (LOVENOX) injection  30 mg Subcutaneous Q24H  . finasteride  5 mg Oral Daily  . insulin aspart  0-15 Units Subcutaneous 6 times per day  . insulin detemir  8 Units Subcutaneous Daily  . metronidazole  500 mg Intravenous Q8H  . sodium chloride  3 mL Intravenous Q12H  . tamsulosin  0.4 mg Oral Daily   Continuous Infusions: . sodium chloride 125 mL/hr at 07/21/15 1040    Principal Problem:   Sepsis ([place]) Active Problems:   Hypertension goal BP (blood pressure) < 140/90   Hyperlipidemia LDL goal <100   Chronic kidney disease (CKD), stage III (moderate)   Anemia of chronic renal failure   Acute renal failure (ARF) (HCC)   Transaminitis   Diabetes mellitus, insulin dependent (IDDM), uncontrolled (HCC)   Leukocytosis   Acute kidney injury ([place])   SOB (shortness of breath)    Liver abscess    Time spent: 30 min    Brian [name]  [facility] Pager [phone]. If 7PM-7AM, please contact night-coverage at [email or web address], password [facility] 07/21/2015, 11:34 AM  LOS: 3 days

## 2015-07-21 NOTE — Progress Notes (Signed)
Text page to DR Coralyn Pear Pt's HR is [id number] asked for next post void bladder scan Pt denies Chest pain

## 2015-07-21 NOTE — Progress Notes (Signed)
Pt 's post void bladder scan 80 cc. Notified DR Coralyn Pear per text page - HR 124 [place].

## 2015-07-22 DIAGNOSIS — Z9689 Presence of other specified functional implants: Secondary | ICD-10-CM

## 2015-07-22 LAB — COMPREHENSIVE METABOLIC PANEL
ALT: 90 U/L — AB (ref 17–63)
ANION GAP: 11 (ref 5–15)
AST: 66 U/L — ABNORMAL HIGH (ref 15–41)
Albumin: 1.9 g/dL — ABNORMAL LOW (ref 3.5–5.0)
Alkaline Phosphatase: 61 U/L (ref 38–126)
BUN: 24 mg/dL — ABNORMAL HIGH (ref 6–20)
CHLORIDE: 114 mmol/L — AB (ref 101–111)
CO2: 15 mmol/L — ABNORMAL LOW (ref 22–32)
CREATININE: 1.45 mg/dL — AB (ref 0.61–1.24)
Calcium: 7.7 mg/dL — ABNORMAL LOW (ref 8.9–10.3)
GFR, EST AFRICAN AMERICAN: 51 mL/min — AB (ref >60)
GFR, EST NON AFRICAN AMERICAN: 44 mL/min — AB (ref >60)
Glucose, Bld: 214 mg/dL — ABNORMAL HIGH (ref 65–99)
Potassium: 3.8 mmol/L (ref 3.5–5.1)
Sodium: 140 mmol/L (ref 135–145)
Total Bilirubin: 0.6 mg/dL (ref 0.3–1.2)
Total Protein: 5.4 g/dL — ABNORMAL LOW (ref 6.5–8.1)

## 2015-07-22 LAB — CBC
HCT: 25.3 % — ABNORMAL LOW (ref 39.0–52.0)
Hemoglobin: 8.8 g/dL — ABNORMAL LOW (ref 13.0–17.0)
MCH: 29.2 pg (ref 26.0–34.0)
MCHC: 34.8 g/dL (ref 30.0–36.0)
MCV: 84.1 fL (ref 78.0–100.0)
PLATELETS: 281 10*3/uL (ref 150–400)
RBC: 3.01 MIL/uL — AB (ref 4.22–5.81)
RDW: 14.7 % (ref 11.5–15.5)
WBC: 13.6 10*3/uL — AB (ref 4.0–10.5)

## 2015-07-22 LAB — GLUCOSE, CAPILLARY
GLUCOSE-CAPILLARY: 187 mg/dL — AB (ref 65–99)
GLUCOSE-CAPILLARY: 219 mg/dL — AB (ref 65–99)
Glucose-Capillary: 194 mg/dL — ABNORMAL HIGH (ref 65–99)
Glucose-Capillary: 150 mg/dL — ABNORMAL HIGH (ref 65–99)
Glucose-Capillary: 97 mg/dL (ref 65–99)

## 2015-07-22 MED ORDER — LEVALBUTEROL HCL 0.63 MG/3ML IN NEBU
0.6300 mg | INHALATION_SOLUTION | Freq: Three times a day (TID) | RESPIRATORY_TRACT | Status: DC
  Administered 2015-07-23 – 2015-07-26 (×10): 0.63 mg via RESPIRATORY_TRACT
  Filled 2015-07-22 (×12): qty 3

## 2015-07-22 MED ORDER — INSULIN ASPART 100 UNIT/ML ~~LOC~~ SOLN
0.0000 [IU] | Freq: Three times a day (TID) | SUBCUTANEOUS | Status: DC
  Administered 2015-07-22: 3 [IU] via SUBCUTANEOUS
  Administered 2015-07-22: 5 [IU] via SUBCUTANEOUS
  Administered 2015-07-22: 2 [IU] via SUBCUTANEOUS
  Administered 2015-07-23: 3 [IU] via SUBCUTANEOUS
  Administered 2015-07-23: 5 [IU] via SUBCUTANEOUS
  Administered 2015-07-24: 3 [IU] via SUBCUTANEOUS
  Administered 2015-07-24: 11 [IU] via SUBCUTANEOUS

## 2015-07-22 NOTE — Progress Notes (Signed)
Referring Physician(s): Vanessa Barbara  Chief Complaint:  Hepatic abscess  Subjective:  Hepatic abscess drain placed 11/26 Better today Less pain; no nausea   Allergies: Niaspan; Shellfish allergy; Statins; and Zetia  Medications: Prior to Admission medications   Medication Sig Start Date End Date Taking? Authorizing Provider  amLODipine (NORVASC) 10 MG tablet Take 1 tablet (10 mg total) by mouth daily. 07/10/15  Yes Edwena Felty, MD  aspirin EC 81 MG tablet Take 81 mg by mouth daily.   Yes Historical Provider, MD  carvedilol (COREG) 6.25 MG tablet Take 1 tablet by mouth 2 (two) times daily. 02/27/15  Yes Historical Provider, MD  docusate sodium (COLACE) 100 MG capsule Take 1 capsule (100 mg total) by mouth 2 (two) times daily. 04/24/15  Yes Edwena Felty, MD  fenofibrate 160 MG tablet Take 1 tablet (160 mg total) by mouth daily. 06/05/15  Yes Edwena Felty, MD  finasteride (PROSCAR) 5 MG tablet Take 1 tablet (5 mg total) by mouth daily. 06/15/15  Yes Alba Cory, MD  glimepiride (AMARYL) 2 MG tablet Take 1 tablet (2 mg total) by mouth 2 (two) times daily. 06/05/15  Yes Edwena Felty, MD  Insulin Detemir (LEVEMIR) 100 UNIT/ML Pen Inject 8 Units into the skin daily. 05/08/15  Yes Edwena Felty, MD  Multiple Vitamin (MULTIVITAMIN WITH MINERALS) TABS tablet Take 1 tablet by mouth daily.   Yes Historical Provider, MD  quinapril (ACCUPRIL) 20 MG tablet Take 1 tablet (20 mg total) by mouth 2 (two) times daily. 02/14/15  Yes Edwena Felty, MD  sitaGLIPtin (JANUVIA) 25 MG tablet Take 1 tablet (25 mg total) by mouth daily. 05/03/15  Yes Edwena Felty, MD  Tamsulosin HCl (FLOMAX) 0.4 MG CAPS Take 0.4 mg by mouth daily.   Yes Historical Provider, MD  Blood Glucose Monitoring Suppl (ONE TOUCH ULTRA 2) W/DEVICE KIT  04/24/15   Historical Provider, MD  Cholecalciferol (VITAMIN D3) 1000 UNITS CAPS Take 1 capsule by mouth daily.    Historical Provider, MD  diphenoxylate-atropine (LOMOTIL)  2.5-0.025 MG per tablet Take 1 tablet by mouth 4 (four) times daily as needed for diarrhea or loose stools. 04/24/15   Edwena Felty, MD  glucose blood test strip Use as instructed 03/28/15   Edwena Felty, MD  meloxicam (MOBIC) 7.5 MG tablet Take 7.5 mg by mouth 2 (two) times daily.     Historical Provider, MD     Vital Signs: BP 135/87 mmHg  Pulse 117  Temp(Src) 98.9 F (37.2 C) (Oral)  Resp 30  Ht 5\' 11"  (1.803 m)  Wt 174 lb 13.2 oz (79.3 kg)  BMI 24.39 kg/m2  SpO2 100%  Physical Exam  Constitutional: He is oriented to person, place, and time.  Abdominal: Soft.  Site of hepatic abscess drain clean and dry NT No bleeding Output 200 cc yesterday 40 cc in JP Bloody/pus  Neurological: He is alert and oriented to person, place, and time.  Skin: Skin is warm and dry.  wbc 13.6 (18.3)  Imaging: Ct Abdomen Pelvis Wo Contrast  07/20/2015  CLINICAL DATA:  Acute onset of renal failure. Severe sepsis. Hypotension. Initial encounter. EXAM: CT ABDOMEN AND PELVIS WITHOUT CONTRAST TECHNIQUE: Multidetector CT imaging of the abdomen and pelvis was performed following the standard protocol without IV contrast. COMPARISON:  Abdominal ultrasound performed 07/18/2015, and MRI of the abdomen performed 12/28/2013. CT of the abdomen from 11/28/2013 FINDINGS: A trace right-sided pleural effusion is noted. There is an unusual heterogeneously hypoattenuating lesion within the posterior right hepatic lobe, measuring  7.1 x 5.0 x 5.7 cm, new from 2015. Malignancy cannot be excluded, though given the patient's sepsis, underlying abscess might have a similar appearance. The gallbladder is within normal limits. The pancreas and adrenal glands are unremarkable. Mild left-sided perinephric stranding is noted, with trace fluid tracking about Gerota's fascia on the left. Mild right-sided perinephric stranding is also seen. A few nonobstructing bilateral renal stones are seen, measuring up to 5 mm in size. There is no  evidence of hydronephrosis. No obstructing ureteral stones are identified. Small bowel loops measure up to 3.0 cm in diameter, borderline normal, without definite evidence for bowel obstruction. The stomach is within normal limits. No acute vascular abnormalities are seen. Relatively diffuse calcification is noted along the abdominal aorta and its branches. The appendix is normal in caliber, without evidence of appendicitis. Scattered diverticulosis is noted along the ascending, descending and proximal sigmoid colon, without evidence of diverticulitis. The bladder is mildly distended and grossly unremarkable in appearance. The prostate remains normal in size. A small to moderate amount of free fluid within the pelvis is nonspecific and may be physiologic in nature. No inguinal lymphadenopathy is seen. No acute osseous abnormalities are identified. IMPRESSION: 1. Unusual large heterogeneous hypoattenuating lesion within the posterior right hepatic lobe, measuring 7.1 x 5.0 x 5.7 cm, new from 2015. Primary hepatic malignancy is a concern, though given the patient's sepsis and on this noncontrast study, underlying abscess might have a similar appearance. Would correlate with LFTs. Dynamic liver protocol MRI or CT is recommended for further evaluation. 2. New left-sided perinephric stranding and fluid tracking about Gerota's fascia raises question for mild left-sided pyelonephritis, given the patient's symptoms. Would correlate with urinalysis results. 3. Small to moderate amount of free fluid within the pelvis is nonspecific and may be physiologic in nature, or could reflect one of the processes above. 4. Few nonobstructing bilateral renal stones measure up to 5 mm in size. 5. Relatively diffuse calcification along the abdominal aorta and branches. 6. Scattered diverticulosis along the ascending, descending and proximal sigmoid colon, without evidence of diverticulitis. 7. Trace right-sided pleural effusion noted.  Electronically Signed   By: Garald Balding M.D.   On: 07/20/2015 07:08   Dg Chest 2 View  07/19/2015  CLINICAL DATA:  Fever and weakness EXAM: CHEST  2 VIEW COMPARISON:  Chest x-ray dated 07/18/2015. FINDINGS: Heart size is normal. Cardiomediastinal silhouette is stable in size and configuration. Mild atherosclerotic changes again noted at the aortic knob. Lungs are clear. Lung volumes are normal. Pulmonary vasculature appears normal. No pleural effusions seen. No pneumothorax. No acute osseous abnormality. IMPRESSION: Lungs are clear and there is no evidence of acute cardiopulmonary abnormality. No pneumonia seen. Electronically Signed   By: Franki Cabot M.D.   On: 07/19/2015 08:14   Dg Chest 2 View  07/18/2015  CLINICAL DATA:  Leukocytosis. EXAM: CHEST  2 VIEW COMPARISON:  None. FINDINGS: The heart size and mediastinal contours are within normal limits. Both lungs are clear. The visualized skeletal structures are unremarkable. IMPRESSION: No active cardiopulmonary disease. Electronically Signed   By: Marijo Conception, M.D.   On: 07/18/2015 12:20   [place] Abscess Drain  07/21/2015  CLINICAL DATA:  81 year old male with right hepatic abscess EXAM: ULTRASOUND GUIDED ABSCESS DRAINAGE Date: 07/21/2015 PROCEDURE: 1. Ultrasound-guided placement of a transhepatic abscess drain into the hepatic abscess. Interventional Radiologist:  Criselda Peaches, MD ANESTHESIA/SEDATION: Moderate (conscious) sedation was used. 1 mg Versed, 50 mcg Fentanyl were administered intravenously. The patient's vital signs  were monitored continuously by radiology nursing throughout the procedure. Sedation Time: 13 minutes MEDICATIONS: None additional.  Patient is on broad-spectrum IV antibiotics. TECHNIQUE: Informed consent was obtained from the patient following explanation of the procedure, risks, benefits and alternatives. The patient understands, agrees and consents for the procedure. All questions were addressed. A time out was  performed. The right upper quadrant was interrogated with ultrasound. A complex multiloculated partially cystic structure is identified in the right hepatic lobe consistent with the clinical history of hepatic abscess. A suitable skin entry site was selected and marked. The region was sterilely prepped and draped in the standard fashion using chlorhexidine skin prep. Local anesthesia was attained by infiltration with 1% lidocaine. A small dermatotomy was made. Under real-time sonographic guidance, an 18 gauge 10 cm trocar needle was advanced into the most cystic portion of the collection. A 0.035 Amplatz wire was then coiled within the space. The skin tract was dilated to 84 [place] and a Cook 12 [place] all-purpose drainage catheter advanced over the wire and formed within the hepatic abscess. Aspiration yields approximately 15 mL purulent bloody fluid. A sample was sent for culture. The abscess catheter was then secured to the skin with 0 Prolene suture and an adhesive fixation device an attached to JP bulb suction. The patient tolerated the procedure well. COMPLICATIONS: None Estimated blood loss:  0 IMPRESSION: Technically successful placement of a 12 French drainage catheter into the right hepatic abscess. Aspiration yielded approximately 15 mL purulent bloody fluid a sample of which was sent for culture. Signed, Criselda Peaches, MD [facility] Electronically Signed   By: Jacqulynn Cadet M.D.   On: 07/21/2015 16:45   US Abdomen Complete  07/18/2015  CLINICAL DATA:  Transaminitis. Additional history of diabetes, hypertension, renal disorder EXAM: ULTRASOUND ABDOMEN COMPLETE COMPARISON:  Abdominal MRI dated 12/28/2013 and CT abdomen dated 11/28/2013. FINDINGS: Gallbladder: Gallbladder is contracted which limits characterization of its walls but there is no evidence of gallbladder wall thickening or pericholecystic fluid. No gallstones seen. No  sonographic Murphy's sign elicited, per the sonographer. Common bile duct: Diameter: Common bile duct within normal limits at 6 mm. No intrahepatic bile duct dilatation identified. Liver: No focal lesion identified. Within normal limits in parenchymal echogenicity. Main portal vein is shown to be patent with appropriate hepatopetal direction of blood flow. IVC: No abnormality visualized. Pancreas: Visualized portion unremarkable. Spleen: Size and appearance within normal limits. Right Kidney: Length: 10.2 cm. Right renal cortex thickness and overall echogenicity is within normal limits. Small cyst seen exophytic to the lower pole of the right kidney, also identified on earlier abdomen MRI. No suspicious mass or lesion within the right kidney. No renal stone or hydronephrosis. Left Kidney: Length: 11.4 cm. Left renal cortex thickness and overall echogenicity is within normal limits. Cyst exophytic to the upper pole region measures 2.6 x 1.9 cm, compatible with the Bosniak 2 cyst described on previous MRI, similar in size. Additional complex cyst within the midpole region measures 2 x 1.8 cm, also compatible with the Bosniak 2 cyst described on previous MRI, also similar in size. No new mass or cyst identified within the left kidney. No renal stone or hydronephrosis. Abdominal aorta: No aneurysm visualized. Distal portion of the abdominal aorta obscured by overlying bowel. Other findings: None.  No free fluid identified in the abdomen. IMPRESSION: 1. Gallbladder is contracted. Patient was not NPO for this exam. The gallbladder contraction limits characterization but there is no convincing gallbladder  wall thickening, pericholecystic edema or other secondary signs of an acute cholecystitis. No gallstones seen. 2. No bile duct dilatation. 3. Liver appears normal. 4. Visualized portions of pancreas appear normal. No peripancreatic fluid. 5. Bilateral renal cysts, similar in appearance to the findings on abdomen MRI dated  12/28/2013. No acute renal abnormality seen. Electronically Signed   By: Franki Cabot M.D.   On: 07/18/2015 11:36   Mr Liver W Wo Contrast  07/20/2015  CLINICAL DATA:  81 year old male inpatient with sepsis and new right liver mass on noncontrast CT worrisome for liver abscess. EXAM: MRI ABDOMEN WITHOUT AND WITH CONTRAST TECHNIQUE: Multiplanar multisequence MR imaging of the abdomen was performed both before and after the administration of intravenous contrast. CONTRAST:  79mL MULTIHANCE GADOBENATE DIMEGLUMINE 529 MG/ML IV SOLN COMPARISON:  Unenhanced CT abdomen/ pelvis from earlier today. MRI abdomen from 12/28/2013. FINDINGS: Most of the MRI sequences are significantly motion degraded. Lower chest: Small right and trace left layering pleural effusions. Hepatobiliary: There is a complex 8.6 x 5.8 x 7.9 cm multilocular cystic liver mass centered at the junction of segments 6 and 7 of the right liver lobe, which demonstrates multiple thick irregular enhancing internal septations and irregular peripheral hyperenhancement and restricted diffusion, which was not detected on the 07/18/2015 abdominal sonogram from 2 days prior, and is new since the 12/28/2013 MRI study. No additional liver masses. There is mild diffuse hepatic steatosis on chemical shift imaging. Normal gallbladder with no cholelithiasis. No biliary ductal dilatation. Common bile duct diameter 4 mm. No choledocholithiasis. Pancreas: No pancreatic mass or duct dilation. No evidence of pancreas divisum. Spleen: Normal size. No mass. Adrenals/Urinary Tract: Normal right adrenal. Stable 1.1 cm left adrenal adenoma. No hydronephrosis. There are stable small parapelvic simple renal cysts in the lower renal sinus bilaterally. Stable Bosniak category 2 nonenhancing T1 hyperintense hemorrhagic/proteinaceous renal cyst in the lateral upper left kidney. Additional smaller simple renal cysts are present in both kidneys. Stomach/Bowel: Grossly normal stomach.  Visualized small and large bowel is normal caliber, with no bowel wall thickening. Vascular/Lymphatic: Normal caliber abdominal aorta. Patent portal, splenic, hepatic and renal veins. No pathologically enlarged lymph nodes in the abdomen. Other: No abdominal ascites or focal fluid collection. Musculoskeletal: No aggressive appearing focal osseous lesions. IMPRESSION: 1. Complex 8.6 x 5.8 x 7.9 cm multilocular cystic liver mass centered at the junction of segments 6 and 7 in the right liver lobe, with thick irregular enhancing internal septations, irregular peripheral hyperenhancement and restricted diffusion. Given the clinical setting of sepsis, and given that this liver mass appears new since 12/28/2013 and possibly even new since the abdominal sonogram from 2 days prior, this liver mass is favored to represent an evolving liver abscess. A cystic primary liver neoplasm is less likely but not entirely excluded. 2. Small right and trace left layering pleural effusions. 3. Stable small left adrenal adenoma. 4. Stable small benign Bosniak category 1 and category 2 renal cysts. Electronically Signed   By: Ilona Sorrel M.D.   On: 07/20/2015 13:28   Dg Chest Port 1 View  07/19/2015  CLINICAL DATA:  Shortness of breath.  Fever. EXAM: PORTABLE CHEST 1 VIEW 1:54 p.m. COMPARISON:  07/19/2015 6:21 a.m. and 07/18/2015 FINDINGS: The heart size and mediastinal contours are within normal limits. Both lungs are clear. The visualized skeletal structures are unremarkable. IMPRESSION: No active disease. Electronically Signed   By: Lorriane Shire M.D.   On: 07/19/2015 14:00   Dg Chest Port 1 View  07/18/2015  CLINICAL  DATA:  Wheezing, shortness of breath, cough, history of CHF. EXAM: PORTABLE CHEST 1 VIEW COMPARISON:  07/18/2015 FINDINGS: Slightly shallow inspiration. Normal heart size and pulmonary vascularity. Calcification of the aorta. Mediastinal contours appear intact. No focal airspace disease or consolidation in the  lungs. No blunting of costophrenic angles. No pneumothorax. Degenerative changes in the shoulders. IMPRESSION: Shallow inspiration.  No evidence of active pulmonary disease. Electronically Signed   By: Lucienne Capers M.D.   On: 07/18/2015 22:23    Labs:  CBC:  Recent Labs  07/19/15 1418 07/20/15 0547 07/21/15 0256 07/22/15 0307  WBC 16.6* 18.1* 18.3* 13.6*  HGB 10.1* 9.1* 9.2* 8.8*  HCT 29.2* 25.8* 26.3* 25.3*  PLT 226 223 255 281    COAGS:  Recent Labs  07/19/15 1625 07/21/15 0256  INR 1.51* 1.42  APTT 40* 41*    BMP:  Recent Labs  07/19/15 1418 07/20/15 0547 07/21/15 0256 07/22/15 0307  NA 139 139 139 140  K 4.1 3.3* 3.6 3.8  CL 109 113* 113* 114*  CO2 21* 19* 18* 15*  GLUCOSE 161* 79 124* 214*  BUN 41* 31* 25* 24*  CALCIUM 8.4* 7.9* 7.8* 7.7*  CREATININE 2.24* 1.93* 1.54* 1.45*  GFRNONAA 26* 31* 41* 44*  GFRAA 30* 36* 48* 51*    LIVER FUNCTION TESTS:  Recent Labs  07/19/15 0025 07/19/15 1418 07/20/15 0547 07/22/15 0307  BILITOT 1.1 0.8 0.7 0.6  AST 125* 211* 179* 66*  ALT 90* 150* 142* 90*  ALKPHOS 51 79 60 61  PROT 5.7* 6.7 5.4* 5.4*  ALBUMIN 2.4* 2.6* 2.2* 1.9*    Assessment and Plan:  Hepatic abscess Drain placed 11/26 Will follow  Signed: Ester Hilley A 07/22/2015, 8:25 AM   I spent a total of 15 Minutes at the the patient's bedside AND on the patient's hospital floor or unit, greater than 50% of which was counseling/coordinating care for hepatic absc drain

## 2015-07-22 NOTE — Progress Notes (Signed)
[facility] PROGRESS NOTE  HUSSAM HOWDEN [id number] DOB: 1934-03-29 DOA: 07/18/2015 PCP: [name], MD  Assessment/Plan: 1. Severe sepsis -Present on admission, evidenced by a temperature of 103, heart rate of 123, acute kidney injury, hypotension with systolic blood pressures in the 80s to 90s (having a history of hypertension), with elevated transaminases. -Initially his source of infection was unclear, as workup that included abdominal ultrasound, chest x-ray, urinalysis was non-revealing. -He had reported eating oysters earlier this week after which he developed multiple episodes of nausea and vomiting that was associated with diarrhea. This history makes Vibrio parahaemolyticus a concern, particularly since this organism can cause septicemia.  -Provided empiric coverage for Vibrio parahaemolyticus with doxycycline 100 mg IV twice a day and ceftriaxone 1 g IV every 24 hours until blood culture data is available. -On 07/20/2015: CT scan of abdomen and pelvis performed revealing 7.1 x 5.0 x 5.7 cm hypoattenuating lesion within the liver. Radiology reporting that this could represent an abscess or perhaps primary hepatic malignancy. This was further worked up with MRI of liver that demonstrated findings compatible with abscess collection.    -On 07/21/2015 blood cultures drawn from 07/18/2015 growing 1/2 gram-negative rods, identification and susceptibility testing are still in progress. Ceftriaxone changed to Fortaz 1 g IV every 12 hours, remains on doxycycline and Flagyl. Vancomycin discontinued. Interventional radiology placing drain catheter of liver abscess on this date. Gram stain showing gram positive cocci in pairs from abscess.  -On 07/22/2015 labs showing over all improvement with creatinine trending down to 1.45, elevated transaminases coming down (AST 66 and ALT 90), white count coming down to 13,600. He remained afebrile overnight. Overall I think he's doing better,  will need to follow his cultures to guide antibiotic treatment.  -He is having increased cough and congestion, which I think could be related to IV fluids. Will stop IV fluids today and monitor.    2.  Acute on chronic renal failure -Likely secondary to ATN in setting of hypotension or perhaps sepsis/critical illness -Previous labs performed earlier this year revealed creatinine between 1.4 1.7, initial lab work during this hospitalization revealed creatinine of 2.85 with BUN of 36.this improved with IV fluid administration, currently coming down to 2.36 on a.m. Lab work. -On 07/20/2015 creatinine trending down to 1.9 from 2.24, continue IV fluids -On 07/21/2015 lab work showing ongoing improvement to kidney function, creatinine trending down to 1.54 from 1.93 on 07/20/2015.  -On 07/22/2015 labs showing further improvement to kidney function, with Cr coming down to 1.45  3.  Elevated transaminases -Lab work showing AST of 125 with ALT of 9.0. His total bilirubin was 1.1 and alkaline phosphatase 51. Abdominal ultrasound was unrevealing -Further workup with CT scan of abdomen and pelvis without contrast showed a hypoattenuating lesion within the liver which could represent abscess. -S/P drainage of liver abscess on 07/21/2015.  -Labs on 07/22/2015 showing improvement to elevated liver enzymes   4.  Elevated troponin. -Lab work showing upward trend in his troponin to 0.30 -He presently denies chest pain. -I suspect related to demand ischemia in setting of critical illness -Troponins trending down.  5. Diabetes mellitus -Will perform Accu-Cheks every 4 hours with sliding scale coverage, continue Levemir -He presented with hyperglycemia likely caused by infection  -Stopping oral hypoglycemic medications, as patient presents with acute kidney injury.   6.  History of hypertension. -He presented with hypotension for which antihypertensive medications were discontinued  7.  DVT prophylaxis.  Lovenox  Code Status: full code Family  Communication: spoke to his son and updated him on patient's condition Disposition Plan: Will transfer out of SDU to telemetry   Antibiotics:  Vancomycin started on 07/18/2015 stopped on 07/20/2015  Zosyn discontinued on 07/19/2015  Ceftriaxone started on 07/19/2015 stopped on 07/20/2015   Doxycycline started on 07/19/2015   [name] started on 07/20/2015  HPI/Subjective: Mr. Weiman is a 81 year old gentleman with a past medical history of diabetes mellitus, admitted to the medicine service on 07/18/2015 when he presented with complaints of generalized weakness, dizziness, chills that was social with hyperglycemia. He reports having several episodes of nausea and vomiting earlier this week after having oysters. Initial lab work revealed acute on chronic kidney injury having creatinine of 2.85 with BUN of 36. He was found to be febrile having a temperature of 103 and started on broad-spectrum IV antimicrobial therapy with vancomycin and Zosyn. Patient also remained hypotensive overnight with systolic blood pressures in the 80s to 90s.workup revealed an abdominal ultrasoundwhich did not reveal acute intra-abdominal pathology. Lab work did reveal elevated transaminases with AST of 125 and ALT of 90.other workup included a chest x-ray and urinalysis which was unremarkable. Patient was transferred to the stepdown unit on 07/19/2015 for closer monitoring.  Objective: Filed Vitals:   07/22/15 0516 07/22/15 0600  BP:  135/87  Pulse: 117   Temp:    Resp: 22 30    Intake/Output Summary (Last 24 hours) at 07/22/15 0658 Last data filed at 07/22/15 0600  Gross per 24 hour  Intake 4106.25 ml  Output   1640 ml  Net 2466.25 ml   Filed Weights   07/18/15 1223 07/19/15 0515 07/19/15 1531  Weight: 75.5 kg (166 lb 7.2 oz) 76.114 kg (167 lb 12.8 oz) 79.3 kg (174 lb 13.2 oz)    Exam:   General: Has mild ongoing confusion, nontoxic appearing.    Cardiovascular: regular rate and rhythm normal S1-S2  Respiratory: patient having few bilateral expiratory wheezes, normal respiratory effort  Abdomen: on abdominal exam he did not have significant pain with palpation across generalized abdomen, bowel sounds were present, overall nonfocal. Drain in RUQ in place.   Musculoskeletal: no edema  Data Reviewed: Basic Metabolic Panel:  Recent Labs Lab 07/19/15 0025 07/19/15 1418 07/20/15 0547 07/21/15 0256 07/22/15 0307  NA 135 139 139 139 140  K 3.5 4.1 3.3* 3.6 3.8  CL 107 109 113* 113* 114*  CO2 19* 21* 19* 18* 15*  GLUCOSE 195* 161* 79 124* 214*  BUN 39* 41* 31* 25* 24*  CREATININE 2.36* 2.24* 1.93* 1.54* 1.45*  CALCIUM 8.3* 8.4* 7.9* 7.8* 7.7*   Liver Function Tests:  Recent Labs Lab 07/18/15 0830 07/19/15 0025 07/19/15 1418 07/20/15 0547 07/22/15 0307  AST 101* 125* 211* 179* 66*  ALT 67* 90* 150* 142* 90*  ALKPHOS 34* 51 79 60 61  BILITOT 1.4* 1.1 0.8 0.7 0.6  PROT 7.0 5.7* 6.7 5.4* 5.4*  ALBUMIN 3.0* 2.4* 2.6* 2.2* 1.9*   No results for input(s): LIPASE, AMYLASE in the last 168 hours. No results for input(s): AMMONIA in the last 168 hours. CBC:  Recent Labs Lab 07/18/15 0830 07/19/15 0025 07/19/15 1418 07/20/15 0547 07/21/15 0256 07/22/15 0307  WBC 16.0* 11.2* 16.6* 18.1* 18.3* 13.6*  NEUTROABS 12.4*  --   --   --   --   --   HGB 10.0* 8.8* 10.1* 9.1* 9.2* 8.8*  HCT 29.2* 25.3* 29.2* 25.8* 26.3* 25.3*  MCV 85.6 83.8 84.1 83.0 83.2 84.1  PLT 237 194 226  223 255 281   Cardiac Enzymes:  Recent Labs Lab 07/18/15 1818 07/19/15 0025 07/19/15 1418 07/19/15 2145 07/20/15 0547  TROPONINI 0.15* 0.30* 1.08* 0.23* 0.32*   BNP (last 3 results) No results for input(s): BNP in the last 8760 hours.  ProBNP (last 3 results) No results for input(s): PROBNP in the last 8760 hours.  CBG:  Recent Labs Lab 07/21/15 1238 07/21/15 1712 07/21/15 2017 07/21/15 2237 07/22/15 0455  GLUCAP 176* 156*  172* 166* 187*    Recent Results (from the past 240 hour(s))  Urine culture     Status: None   Collection Time: 07/18/15 12:03 PM  Result Value Ref Range Status   Specimen Description URINE, CLEAN CATCH  Final   Special Requests NONE  Final   Culture MULTIPLE SPECIES PRESENT, SUGGEST RECOLLECTION  Final   Report Status 07/19/2015 FINAL  Final  Culture, blood (routine x 2)     Status: None (Preliminary result)   Collection Time: 07/18/15 12:50 PM  Result Value Ref Range Status   Specimen Description BLOOD RIGHT ANTECUBITAL  Final   Special Requests BOTTLES DRAWN AEROBIC AND ANAEROBIC 10CC  Final   Culture  Setup Time   Final    GRAM NEGATIVE COCCOBACILLI ANAEROBIC BOTTLE ONLY CRITICAL RESULT CALLED TO, READ BACK BY AND VERIFIED WITH: A. DAVIS,RN AT 1157 ON [id number] BY S. YARBROUGH    Culture HOLDING FOR POSSIBLE ANAEROBE  Final   Report Status PENDING  Incomplete  Culture, blood (routine x 2)     Status: None (Preliminary result)   Collection Time: 07/18/15 12:53 PM  Result Value Ref Range Status   Specimen Description BLOOD LEFT HAND  Final   Special Requests BOTTLES DRAWN AEROBIC ONLY 3CC  Final   Culture NO GROWTH 3 DAYS  Final   Report Status PENDING  Incomplete  Culture, blood (routine x 2)     Status: None (Preliminary result)   Collection Time: 07/19/15  2:10 PM  Result Value Ref Range Status   Specimen Description BLOOD RIGHT ANTECUBITAL  Final   Special Requests BOTTLES DRAWN AEROBIC ONLY  10CC  Final   Culture NO GROWTH 2 DAYS  Final   Report Status PENDING  Incomplete  Culture, blood (routine x 2)     Status: None (Preliminary result)   Collection Time: 07/19/15  2:18 PM  Result Value Ref Range Status   Specimen Description BLOOD RIGHT HAND  Final   Special Requests IN PEDIATRIC BOTTLE  3CC  Final   Culture NO GROWTH 2 DAYS  Final   Report Status PENDING  Incomplete  MRSA PCR Screening     Status: None   Collection Time: 07/19/15  4:12 PM  Result Value Ref Range  Status   MRSA by PCR NEGATIVE NEGATIVE Final    Comment:        The GeneXpert MRSA Assay (FDA approved for NASAL specimens only), is one component of a comprehensive MRSA colonization surveillance program. It is not intended to diagnose MRSA infection nor to guide or monitor treatment for MRSA infections.   Culture, routine-abscess     Status: None (Preliminary result)   Collection Time: 07/21/15  2:42 PM  Result Value Ref Range Status   Specimen Description ABSCESS LIVER  Final   Special Requests NONE  Final   Gram Stain   Final    ABUNDANT WBC PRESENT, PREDOMINANTLY PMN NO SQUAMOUS EPITHELIAL CELLS SEEN MODERATE GRAM POSITIVE COCCI IN PAIRS Performed at [employer]  PENDING  Incomplete   Report Status PENDING  Incomplete     Studies: [place] Abscess Drain  07/21/2015  CLINICAL DATA:  81 year old male with right hepatic abscess EXAM: ULTRASOUND GUIDED ABSCESS DRAINAGE Date: 07/21/2015 PROCEDURE: 1. Ultrasound-guided placement of a transhepatic abscess drain into the hepatic abscess. Interventional Radiologist:  Criselda Peaches, MD ANESTHESIA/SEDATION: Moderate (conscious) sedation was used. 1 mg Versed, 50 mcg Fentanyl were administered intravenously. The patient's vital signs were monitored continuously by radiology nursing throughout the procedure. Sedation Time: 13 minutes MEDICATIONS: None additional.  Patient is on broad-spectrum IV antibiotics. TECHNIQUE: Informed consent was obtained from the patient following explanation of the procedure, risks, benefits and alternatives. The patient understands, agrees and consents for the procedure. All questions were addressed. A time out was performed. The right upper quadrant was interrogated with ultrasound. A complex multiloculated partially cystic structure is identified in the right hepatic lobe consistent with the clinical history of hepatic abscess. A suitable skin entry site was selected and marked. The region was  sterilely prepped and draped in the standard fashion using chlorhexidine skin prep. Local anesthesia was attained by infiltration with 1% lidocaine. A small dermatotomy was made. Under real-time sonographic guidance, an 18 gauge 10 cm trocar needle was advanced into the most cystic portion of the collection. A 0.035 Amplatz wire was then coiled within the space. The skin tract was dilated to 72 [place] and a Cook 12 [place] all-purpose drainage catheter advanced over the wire and formed within the hepatic abscess. Aspiration yields approximately 15 mL purulent bloody fluid. A sample was sent for culture. The abscess catheter was then secured to the skin with 0 Prolene suture and an adhesive fixation device an attached to JP bulb suction. The patient tolerated the procedure well. COMPLICATIONS: None Estimated blood loss:  0 IMPRESSION: Technically successful placement of a 12 French drainage catheter into the right hepatic abscess. Aspiration yielded approximately 15 mL purulent bloody fluid a sample of which was sent for culture. Signed, Criselda Peaches, MD [facility] Electronically Signed   By: Jacqulynn Cadet M.D.   On: 07/21/2015 16:45   Mr Liver W Wo Contrast  07/20/2015  CLINICAL DATA:  81 year old male inpatient with sepsis and new right liver mass on noncontrast CT worrisome for liver abscess. EXAM: MRI ABDOMEN WITHOUT AND WITH CONTRAST TECHNIQUE: Multiplanar multisequence MR imaging of the abdomen was performed both before and after the administration of intravenous contrast. CONTRAST:  90mL MULTIHANCE GADOBENATE DIMEGLUMINE 529 MG/ML IV SOLN COMPARISON:  Unenhanced CT abdomen/ pelvis from earlier today. MRI abdomen from 12/28/2013. FINDINGS: Most of the MRI sequences are significantly motion degraded. Lower chest: Small right and trace left layering pleural effusions. Hepatobiliary: There is a complex 8.6 x 5.8 x 7.9 cm multilocular cystic  liver mass centered at the junction of segments 6 and 7 of the right liver lobe, which demonstrates multiple thick irregular enhancing internal septations and irregular peripheral hyperenhancement and restricted diffusion, which was not detected on the 07/18/2015 abdominal sonogram from 2 days prior, and is new since the 12/28/2013 MRI study. No additional liver masses. There is mild diffuse hepatic steatosis on chemical shift imaging. Normal gallbladder with no cholelithiasis. No biliary ductal dilatation. Common bile duct diameter 4 mm. No choledocholithiasis. Pancreas: No pancreatic mass or duct dilation. No evidence of pancreas divisum. Spleen: Normal size. No mass. Adrenals/Urinary Tract: Normal right adrenal. Stable 1.1 cm left adrenal adenoma. No hydronephrosis. There are stable small parapelvic simple renal  cysts in the lower renal sinus bilaterally. Stable Bosniak category 2 nonenhancing T1 hyperintense hemorrhagic/proteinaceous renal cyst in the lateral upper left kidney. Additional smaller simple renal cysts are present in both kidneys. Stomach/Bowel: Grossly normal stomach. Visualized small and large bowel is normal caliber, with no bowel wall thickening. Vascular/Lymphatic: Normal caliber abdominal aorta. Patent portal, splenic, hepatic and renal veins. No pathologically enlarged lymph nodes in the abdomen. Other: No abdominal ascites or focal fluid collection. Musculoskeletal: No aggressive appearing focal osseous lesions. IMPRESSION: 1. Complex 8.6 x 5.8 x 7.9 cm multilocular cystic liver mass centered at the junction of segments 6 and 7 in the right liver lobe, with thick irregular enhancing internal septations, irregular peripheral hyperenhancement and restricted diffusion. Given the clinical setting of sepsis, and given that this liver mass appears new since 12/28/2013 and possibly even new since the abdominal sonogram from 2 days prior, this liver mass is favored to represent an evolving liver  abscess. A cystic primary liver neoplasm is less likely but not entirely excluded. 2. Small right and trace left layering pleural effusions. 3. Stable small left adrenal adenoma. 4. Stable small benign Bosniak category 1 and category 2 renal cysts. Electronically Signed   By: Ilona Sorrel M.D.   On: 07/20/2015 13:28    Scheduled Meds: . aspirin  81 mg Oral Daily  . cefTAZidime (FORTAZ)  IV  1 g Intravenous Q12H  . doxycycline (VIBRAMYCIN) IV  100 mg Intravenous Q12H  . enoxaparin (LOVENOX) injection  40 mg Subcutaneous Q24H  . finasteride  5 mg Oral Daily  . insulin aspart  0-15 Units Subcutaneous TID WC  . insulin detemir  8 Units Subcutaneous Daily  . metronidazole  500 mg Intravenous Q8H  . sodium chloride  3 mL Intravenous Q12H  . tamsulosin  0.4 mg Oral Daily   Continuous Infusions:    Principal Problem:   Sepsis ([place]) Active Problems:   Hypertension goal BP (blood pressure) < 140/90   Hyperlipidemia LDL goal <100   Chronic kidney disease (CKD), stage III (moderate)   Anemia of chronic renal failure   Acute renal failure (ARF) (H[facility])   Transaminitis   Diabetes mellitus, insulin dependent (IDDM), uncontrolled (H[facility])   Leukocytosis   Acute kidney injury ([place])   SOB (shortness of breath)   Liver abscess   Fever and chills   Gram-negative bacteremia ([place])    Time spent: 30 min    Kelvin Cellar  [facility] Pager [phone]. If 7PM-7AM, please contact night-coverage at [email or web address], password [facility] 07/22/2015, 6:58 AM  LOS: 4 days

## 2015-07-22 NOTE — Progress Notes (Addendum)
[facility] for Infectious Disease    Date of Admission:  07/18/2015   Total days of antibiotics 5        Day 3 ceftaz        Day 4 doxy        Day 3 metro   ID: Brian [name] is a 81 y.o. male with sepsis due to GN bacteremia and liver abscess Principal Problem:   Sepsis ([place]) Active Problems:   Hypertension goal BP (blood pressure) < 140/90   Hyperlipidemia LDL goal <100   Chronic kidney disease (CKD), stage III (moderate)   Anemia of chronic renal failure   Acute renal failure (ARF) (HCC)   Transaminitis   Diabetes mellitus, insulin dependent (IDDM), uncontrolled (HCC)   Leukocytosis   Acute kidney injury ([place])   SOB (shortness of breath)   Liver abscess   Fever and chills   Gram-negative bacteremia (HCC)    Subjective: Fever curve trending down, having some pain at RUQ since drain placement  Interval hx: underwent IR guided drain to liver abscess 8.6 x 5.8 x 7.9 cm multiloculated. Has drained 240 mL in the last 24hr. Leukocytosis improving Medications:  . aspirin  81 mg Oral Daily  . cefTAZidime (FORTAZ)  IV  1 g Intravenous Q12H  . enoxaparin (LOVENOX) injection  40 mg Subcutaneous Q24H  . finasteride  5 mg Oral Daily  . insulin aspart  0-15 Units Subcutaneous TID WC  . insulin detemir  8 Units Subcutaneous Daily  . metronidazole  500 mg Intravenous Q8H  . sodium chloride  3 mL Intravenous Q12H  . tamsulosin  0.4 mg Oral Daily    Objective: Vital signs in last 24 hours: Temp:  [98.2 F (36.8 C)-99.4 F (37.4 C)] 98.2 F (36.8 C) (11/27 0800) Pulse Rate:  [113-135] 117 (11/27 0516) Resp:  [18-32] 28 (11/27 0800) BP: (93-166)/(47-92) 137/89 mmHg (11/27 0800) SpO2:  [98 %-100 %] 100 % (11/27 0300) Physical Exam  Constitutional: sleeping. He appears well-developed and well-nourished. No distress.  HENT:  Mouth/Throat: Oropharynx is clear and moist. No oropharyngeal exudate.  Cardiovascular: Normal rate, regular rhythm and normal heart sounds.  Exam reveals no gallop and no friction rub.  No murmur heard.  Pulmonary/Chest: Effort normal and breath sounds normal. No respiratory distress. He has no wheezes.  Abdominal: midly distended, decreased bowel sounds.  There is no tenderness. Drain in place Skin: Skin is warm and dry. No rash noted. No erythema.    Lab Results  Recent Labs  07/21/15 0256 07/22/15 0307  WBC 18.3* 13.6*  HGB 9.2* 8.8*  HCT 26.3* 25.3*  NA 139 140  K 3.6 3.8  CL 113* 114*  CO2 18* 15*  BUN 25* 24*  CREATININE 1.54* 1.45*   Liver Panel  Recent Labs  07/20/15 0547 07/22/15 0307  PROT 5.4* 5.4*  ALBUMIN 2.2* 1.9*  AST 179* 66*  ALT 142* 90*  ALKPHOS 60 61  BILITOT 0.7 0.6   Microbiology: 11/24 blood cx ngtd 11/23 blood cx in anaerobic cx GNCB 11/26 aspirate cx GPC Studies/Results: [place] Abscess Drain  07/21/2015  CLINICAL DATA:  81 year old male with right hepatic abscess EXAM: ULTRASOUND GUIDED ABSCESS DRAINAGE Date: 07/21/2015 PROCEDURE: 1. Ultrasound-guided placement of a transhepatic abscess drain into the hepatic abscess. Interventional Radiologist:  Criselda Peaches, MD ANESTHESIA/SEDATION: Moderate (conscious) sedation was used. 1 mg Versed, 50 mcg Fentanyl were administered intravenously. The patient's vital signs were monitored continuously by radiology nursing throughout the procedure. Sedation  Time: 13 minutes MEDICATIONS: None additional.  Patient is on broad-spectrum IV antibiotics. TECHNIQUE: Informed consent was obtained from the patient following explanation of the procedure, risks, benefits and alternatives. The patient understands, agrees and consents for the procedure. All questions were addressed. A time out was performed. The right upper quadrant was interrogated with ultrasound. A complex multiloculated partially cystic structure is identified in the right hepatic lobe consistent with the clinical history of hepatic abscess. A suitable skin entry site was selected and marked.  The region was sterilely prepped and draped in the standard fashion using chlorhexidine skin prep. Local anesthesia was attained by infiltration with 1% lidocaine. A small dermatotomy was made. Under real-time sonographic guidance, an 18 gauge 10 cm trocar needle was advanced into the most cystic portion of the collection. A 0.035 Amplatz wire was then coiled within the space. The skin tract was dilated to 54 [place] and a Cook 12 [place] all-purpose drainage catheter advanced over the wire and formed within the hepatic abscess. Aspiration yields approximately 15 mL purulent bloody fluid. A sample was sent for culture. The abscess catheter was then secured to the skin with 0 Prolene suture and an adhesive fixation device an attached to JP bulb suction. The patient tolerated the procedure well. COMPLICATIONS: None Estimated blood loss:  0 IMPRESSION: Technically successful placement of a 12 French drainage catheter into the right hepatic abscess. Aspiration yielded approximately 15 mL purulent bloody fluid a sample of which was sent for culture. Signed, Criselda Peaches, MD [facility] Radiology Electronically Signed   By: Jacqulynn Cadet M.D.   On: 07/21/2015 16:45   Mr Liver W Wo Contrast  07/20/2015  CLINICAL DATA:  81 year old male inpatient with sepsis and new right liver mass on noncontrast CT worrisome for liver abscess. EXAM: MRI ABDOMEN WITHOUT AND WITH CONTRAST TECHNIQUE: Multiplanar multisequence MR imaging of the abdomen was performed both before and after the administration of intravenous contrast. CONTRAST:  39mL MULTIHANCE GADOBENATE DIMEGLUMINE 529 MG/ML IV SOLN COMPARISON:  Unenhanced CT abdomen/ pelvis from earlier today. MRI abdomen from 12/28/2013. FINDINGS: Most of the MRI sequences are significantly motion degraded. Lower chest: Small right and trace left layering pleural effusions. Hepatobiliary: There is a complex 8.6 x 5.8 x 7.9 cm  multilocular cystic liver mass centered at the junction of segments 6 and 7 of the right liver lobe, which demonstrates multiple thick irregular enhancing internal septations and irregular peripheral hyperenhancement and restricted diffusion, which was not detected on the 07/18/2015 abdominal sonogram from 2 days prior, and is new since the 12/28/2013 MRI study. No additional liver masses. There is mild diffuse hepatic steatosis on chemical shift imaging. Normal gallbladder with no cholelithiasis. No biliary ductal dilatation. Common bile duct diameter 4 mm. No choledocholithiasis. Pancreas: No pancreatic mass or duct dilation. No evidence of pancreas divisum. Spleen: Normal size. No mass. Adrenals/Urinary Tract: Normal right adrenal. Stable 1.1 cm left adrenal adenoma. No hydronephrosis. There are stable small parapelvic simple renal cysts in the lower renal sinus bilaterally. Stable Bosniak category 2 nonenhancing T1 hyperintense hemorrhagic/proteinaceous renal cyst in the lateral upper left kidney. Additional smaller simple renal cysts are present in both kidneys. Stomach/Bowel: Grossly normal stomach. Visualized small and large bowel is normal caliber, with no bowel wall thickening. Vascular/Lymphatic: Normal caliber abdominal aorta. Patent portal, splenic, hepatic and renal veins. No pathologically enlarged lymph nodes in the abdomen. Other: No abdominal ascites or focal fluid collection. Musculoskeletal: No aggressive appearing focal osseous lesions. IMPRESSION:  1. Complex 8.6 x 5.8 x 7.9 cm multilocular cystic liver mass centered at the junction of segments 6 and 7 in the right liver lobe, with thick irregular enhancing internal septations, irregular peripheral hyperenhancement and restricted diffusion. Given the clinical setting of sepsis, and given that this liver mass appears new since 12/28/2013 and possibly even new since the abdominal sonogram from 2 days prior, this liver mass is favored to represent  an evolving liver abscess. A cystic primary liver neoplasm is less likely but not entirely excluded. 2. Small right and trace left layering pleural effusions. 3. Stable small left adrenal adenoma. 4. Stable small benign Bosniak category 1 and category 2 renal cysts. Electronically Signed   By: Ilona Sorrel M.D.   On: 07/20/2015 13:28     Assessment/Plan: Liver abscess =agree with plan for IR drain, please send fluid for culture, continue with empiric coverage with ceftaz, metronidazole. Spoke with micro that states that the pathogen on gram stain does not have features suggesting vibrio. Will d/c doxy  Gram negative bacteremia = repeat cx are NGTD. Continue with broad spectrum and will narrow once plates are ready in micro tomorrow. Currently only growing in anaerobic plates, which can be slow growing.  Leukocytosis =  trend down once abscess is drained  aki = appears improving with fluids received throughout admission  [facility], [facility] for Infectious Diseases Cell: [phone] Pager: [phone]  07/22/2015, 10:36 AM

## 2015-07-22 NOTE — Evaluation (Signed)
Physical Therapy Evaluation Patient Details Name: EMORI GALAYDA MRN: [id number] DOB: 12/21/1933 Today's Date: 07/22/2015   History of Present Illness  Admitted with weakness, fever tachycardia, sepsis likely secondary to lover abscess, drain placed by interventional Radiology on 11/26; since drain placement overall status improving; PMH significant for DM  Clinical Impression   Pt admitted with above diagnosis. Pt currently with functional limitations due to the deficits listed below (see PT Problem List).  Pt will benefit from skilled PT to increase their independence and safety with mobility to allow discharge to the venue listed below.       Follow Up Recommendations Home health PT;Supervision/Assistance - 24 hour    Equipment Recommendations  Rolling walker with 5" wheels;3in1 (PT)    Recommendations for Other Services OT consult     Precautions / Restrictions Precautions Precautions: Fall Precaution Comments: Monitor HR, tachy on eval  Percutaneous drain R side     Mobility  Bed Mobility Overal bed mobility: Needs Assistance Bed Mobility: Supine to Sit     Supine to sit: Supervision     General bed mobility comments: Slow moving, with cues and suprevision for maintenance of lines and leads; very much wanting to do all he can independently  Transfers Overall transfer level: Needs assistance Equipment used: 2 person hand held assist Transfers: Sit to/from Stand Sit to Stand: Min assist;+2 safety/equipment         General transfer comment: Min assist for power-up and steadiness at initial stand; cues to self-monitor for activity tolerance  Ambulation/Gait Ambulation/Gait assistance: Min assist;+2 safety/equipment Ambulation Distance (Feet): 18 Feet Assistive device: 1 person hand held assist Gait Pattern/deviations: Step-through pattern;Decreased stride length Gait velocity: slow   General Gait Details: Cues to self-monitor for activity tolerance;  reported dizziness and feeling "not quite right"; min assist to steady  Stairs            Wheelchair Mobility    Modified Rankin (Stroke Patients Only)       Balance Overall balance assessment: Needs assistance           Standing balance-Leahy Scale: Poor Standing balance comment: Relies on UE support for balance with upright activity ; noted tendency to brace backs of LEs against bed                             Pertinent Vitals/Pain Pain Assessment: Faces Faces Pain Scale: Hurts little more Pain Location: R flank pain; pain flares up sporadically, and seems to subside quickly Pain Descriptors / Indicators: Aching;Sore Pain Intervention(s): Limited activity within patient's tolerance;Monitored during session;Repositioned    Home Living Family/patient expects to be discharged to:: Private residence Living Arrangements: Spouse/significant other Available Help at Discharge: Family;Available PRN/intermittently Type of Home: House Home Access: Stairs to enter Entrance Stairs-Rails: Left Entrance Stairs-Number of Steps: 3 Home Layout: Two level;Bed/bath upstairs;Able to live on main level with bedroom/bathroom Home Equipment: None      Prior Function Level of Independence: Independent               Hand Dominance        Extremity/Trunk Assessment   Upper Extremity Assessment: Generalized weakness           Lower Extremity Assessment: Generalized weakness         Communication   Communication: No difficulties;Other (comment) (difficult to understand at times)  Cognition Arousal/Alertness: Awake/alert Behavior During Therapy: WFL for tasks assessed/performed Overall Cognitive Status: Within Functional  Limits for tasks assessed                      General Comments General comments (skin integrity, edema, etc.): HR ranged 110-129 during session; BP stable after amb; Noted IV somewhat loose, RN came in to assess      Exercises        Assessment/Plan    PT Assessment Patient needs continued PT services  PT Diagnosis Difficulty walking;Generalized weakness;Acute pain   PT Problem List Decreased strength;Decreased range of motion;Decreased activity tolerance;Decreased balance;Decreased mobility;Decreased coordination;Decreased knowledge of use of DME;Decreased safety awareness;Cardiopulmonary status limiting activity;Pain  PT Treatment Interventions DME instruction;Gait training;Stair training;Functional mobility training;Therapeutic activities;Therapeutic exercise;Balance training;Patient/family education   PT Goals (Current goals can be found in the Care Plan section) Acute Rehab PT Goals Patient Stated Goal: Hopes to get better and get home; is realistic about hi ability to manage, saying he may need to sleep in the guest bedroom downstairs for a while PT Goal Formulation: With patient Time For Goal Achievement: 08/05/15 Potential to Achieve Goals: Good    Frequency Min 3X/week   Barriers to discharge        Co-evaluation               End of Session Equipment Utilized During Treatment: Gait belt Activity Tolerance: Patient tolerated treatment well;Other (comment) (though quite tired at end to session) Patient left: in chair;with call bell/phone within reach Nurse Communication: Mobility status         Time: 1001-1026 PT Time Calculation (min) (ACUTE ONLY): 25 min   Charges:   PT Evaluation $Initial PT Evaluation Tier I: 1 Procedure PT Treatments $Gait Training: 8-22 mins   PT G Codes:        Quin Hoop 07/22/2015, 12:18 PM  Roney [place], [place] Pager [phone] Office [phone]

## 2015-07-23 ENCOUNTER — Inpatient Hospital Stay (HOSPITAL_COMMUNITY): Payer: Medicare Other

## 2015-07-23 LAB — BASIC METABOLIC PANEL
ANION GAP: 12 (ref 5–15)
BUN: 27 mg/dL — ABNORMAL HIGH (ref 6–20)
CO2: 16 mmol/L — ABNORMAL LOW (ref 22–32)
Calcium: 8.4 mg/dL — ABNORMAL LOW (ref 8.9–10.3)
Chloride: 115 mmol/L — ABNORMAL HIGH (ref 101–111)
Creatinine, Ser: 1.43 mg/dL — ABNORMAL HIGH (ref 0.61–1.24)
GFR calc Af Amer: 52 mL/min — ABNORMAL LOW (ref >60)
GFR, EST NON AFRICAN AMERICAN: 45 mL/min — AB (ref >60)
GLUCOSE: 155 mg/dL — AB (ref 65–99)
POTASSIUM: 3.7 mmol/L (ref 3.5–5.1)
Sodium: 143 mmol/L (ref 135–145)

## 2015-07-23 LAB — CBC
HEMATOCRIT: 26.9 % — AB (ref 39.0–52.0)
HEMOGLOBIN: 9.6 g/dL — AB (ref 13.0–17.0)
MCH: 29.2 pg (ref 26.0–34.0)
MCHC: 35.7 g/dL (ref 30.0–36.0)
MCV: 81.8 fL (ref 78.0–100.0)
Platelets: 365 10*3/uL (ref 150–400)
RBC: 3.29 MIL/uL — ABNORMAL LOW (ref 4.22–5.81)
RDW: 14.6 % (ref 11.5–15.5)
WBC: 16.3 10*3/uL — ABNORMAL HIGH (ref 4.0–10.5)

## 2015-07-23 LAB — CULTURE, BLOOD (ROUTINE X 2): CULTURE: NO GROWTH

## 2015-07-23 LAB — GLUCOSE, CAPILLARY
GLUCOSE-CAPILLARY: 213 mg/dL — AB (ref 65–99)
GLUCOSE-CAPILLARY: 226 mg/dL — AB (ref 65–99)
GLUCOSE-CAPILLARY: 223 mg/dL — AB (ref 65–99)
Glucose-Capillary: 157 mg/dL — ABNORMAL HIGH (ref 65–99)

## 2015-07-23 MED ORDER — LEVALBUTEROL HCL 0.63 MG/3ML IN NEBU
0.6300 mg | INHALATION_SOLUTION | RESPIRATORY_TRACT | Status: DC | PRN
  Administered 2015-07-23 (×2): 0.63 mg via RESPIRATORY_TRACT
  Filled 2015-07-23: qty 3

## 2015-07-23 MED ORDER — SODIUM BICARBONATE 8.4 % IV SOLN
INTRAVENOUS | Status: DC
  Administered 2015-07-23 – 2015-07-24 (×2): via INTRAVENOUS
  Filled 2015-07-23 (×3): qty 100

## 2015-07-23 NOTE — Progress Notes (Signed)
ANTIBIOTIC CONSULT NOTE - FOLLOW UP  Pharmacy Consult for [facility] Indication: intra-abdominal infection  Allergies  Allergen Reactions  . Niaspan [Niacin Er]   . Shellfish Allergy Other (See Comments)    Pt vomits  . Statins Other (See Comments)    Myalgia  . Zetia [Ezetimibe]     Patient Measurements: Height: 5\' 11"  (180.3 cm) Weight: 174 lb 2.6 oz (79 kg) IBW/kg (Calculated) : 75.3 Adjusted Body Weight: n/a  Vital Signs: Temp: 98.5 F (36.9 C) (11/28 1100) Temp Source: Oral (11/28 1100) BP: 134/75 mmHg (11/28 1100) Pulse Rate: 118 (11/28 1100) Intake/Output from previous day: 11/27 0701 - 11/28 0700 In: [id number] [P.O.:880; IV Piggyback:400] Out: 990 [Urine:800; Drains:190] Intake/Output from this shift: Total I/O In: 245 [P.O.:240; Other:5] Out: [place]:  Recent Labs  07/21/15 0256 07/22/15 0307 07/23/15 0456  WBC 18.3* 13.6* 16.3*  HGB 9.2* 8.8* 9.6*  PLT 255 281 365  CREATININE 1.54* 1.45* 1.43*   Estimated Creatinine Clearance: 44.6 mL/min (by C-G formula based on Cr of 1.43). No results for input(s): VANCOTROUGH, VANCOPEAK, VANCORANDOM, GENTTROUGH, GENTPEAK, GENTRANDOM, TOBRATROUGH, TOBRAPEAK, TOBRARND, AMIKACINPEAK, AMIKACINTROU, AMIKACIN in the last 72 hours.   Microbiology: Recent Results (from the past 720 hour(s))  Urine culture     Status: None   Collection Time: 07/18/15 12:03 PM  Result Value Ref Range Status   Specimen Description URINE, CLEAN CATCH  Final   Special Requests NONE  Final   Culture MULTIPLE SPECIES PRESENT, SUGGEST RECOLLECTION  Final   Report Status 07/19/2015 FINAL  Final  Culture, blood (routine x 2)     Status: None (Preliminary result)   Collection Time: 07/18/15 12:50 PM  Result Value Ref Range Status   Specimen Description BLOOD RIGHT ANTECUBITAL  Final   Special Requests BOTTLES DRAWN AEROBIC AND ANAEROBIC 10CC  Final   Culture  Setup Time   Final    GRAM NEGATIVE COCCOBACILLI ANAEROBIC BOTTLE  ONLY CRITICAL RESULT CALLED TO, READ BACK BY AND VERIFIED WITH: A. DAVIS,RN AT 1157 ON [id number] BY S. YARBROUGH    Culture   Final    ANAEROBIC GRAM NEGATIVE COCCOBACILLI BETA LACTAMASE NEGATIVE    Report Status PENDING  Incomplete  Culture, blood (routine x 2)     Status: None   Collection Time: 07/18/15 12:53 PM  Result Value Ref Range Status   Specimen Description BLOOD LEFT HAND  Final   Special Requests BOTTLES DRAWN AEROBIC ONLY 3CC  Final   Culture NO GROWTH 5 DAYS  Final   Report Status 07/23/2015 FINAL  Final  Culture, blood (routine x 2)     Status: None (Preliminary result)   Collection Time: 07/19/15  2:10 PM  Result Value Ref Range Status   Specimen Description BLOOD RIGHT ANTECUBITAL  Final   Special Requests BOTTLES DRAWN AEROBIC ONLY  10CC  Final   Culture NO GROWTH 4 DAYS  Final   Report Status PENDING  Incomplete  Culture, blood (routine x 2)     Status: None (Preliminary result)   Collection Time: 07/19/15  2:18 PM  Result Value Ref Range Status   Specimen Description BLOOD RIGHT HAND  Final   Special Requests IN PEDIATRIC BOTTLE  3CC  Final   Culture NO GROWTH 4 DAYS  Final   Report Status PENDING  Incomplete  MRSA PCR Screening     Status: None   Collection Time: 07/19/15  4:12 PM  Result Value Ref Range Status   MRSA by PCR NEGATIVE NEGATIVE  Final    Comment:        The GeneXpert MRSA Assay (FDA approved for NASAL specimens only), is one component of a comprehensive MRSA colonization surveillance program. It is not intended to diagnose MRSA infection nor to guide or monitor treatment for MRSA infections.   Culture, routine-abscess     Status: None (Preliminary result)   Collection Time: 07/21/15  2:42 PM  Result Value Ref Range Status   Specimen Description ABSCESS LIVER  Final   Special Requests NONE  Final   Gram Stain   Final    ABUNDANT WBC PRESENT, PREDOMINANTLY PMN NO SQUAMOUS EPITHELIAL CELLS SEEN MODERATE GRAM POSITIVE COCCI IN  PAIRS Performed at [facility]    Culture   Final    NO GROWTH 1 DAY Performed at [facility]    Report Status PENDING  Incomplete    Anti-infectives    Start     Dose/Rate Route Frequency Ordered Stop   07/21/15 1430  cefTRIAXone (ROCEPHIN) 2 g in dextrose 5 % 50 mL IVPB  Status:  Discontinued     2 g 100 mL/hr over 30 Minutes Intravenous Every 24 hours 07/20/15 1849 07/20/15 1850   07/21/15 1000  cefTRIAXone (ROCEPHIN) 2 g in dextrose 5 % 50 mL IVPB  Status:  Discontinued     2 g 100 mL/hr over 30 Minutes Intravenous Every 24 hours 07/20/15 1850 07/20/15 1855   07/20/15 1915  cefTAZidime (FORTAZ) 1 g in dextrose 5 % 50 mL IVPB     1 g 100 mL/hr over 30 Minutes Intravenous Every 12 hours 07/20/15 1909     07/20/15 1645  metroNIDAZOLE (FLAGYL) IVPB 500 mg     500 mg 100 mL/hr over 60 Minutes Intravenous Every 8 hours 07/20/15 1630     07/19/15 1430  doxycycline (VIBRAMYCIN) 100 mg in dextrose 5 % 250 mL IVPB  Status:  Discontinued     100 mg 125 mL/hr over 120 Minutes Intravenous Every 12 hours 07/19/15 1417 07/22/15 1034   07/19/15 1430  cefTRIAXone (ROCEPHIN) 1 g in dextrose 5 % 50 mL IVPB  Status:  Discontinued     1 g 100 mL/hr over 30 Minutes Intravenous Every 24 hours 07/19/15 1417 07/20/15 1849   07/18/15 2200  piperacillin-tazobactam (ZOSYN) IVPB 3.375 g  Status:  Discontinued     3.375 g 12.5 mL/hr over 240 Minutes Intravenous 3 times per day 07/18/15 1850 07/19/15 1415   07/18/15 1900  vancomycin (VANCOCIN) IVPB 750 mg/150 ml premix  Status:  Discontinued     750 mg 150 mL/hr over 60 Minutes Intravenous Every 24 hours 07/18/15 1850 07/20/15 1630   07/18/15 1300  cefTRIAXone (ROCEPHIN) 1 g in dextrose 5 % 50 mL IVPB  Status:  Discontinued     1 g 100 mL/hr over 30 Minutes Intravenous Every 24 hours 07/18/15 1241 07/18/15 1834      Assessment: 81 yo male with liver abscess and bacteremia.  Currently on ceftazidime & metronidazole for gram empiric  coverage. To IR for drain soon.  Doxycycline d/c'ed per ID since gram stain does not suggest vibrio.  Scr 1.93, est CrCl ~ 33 ml/min.  Blood cultures growing gram negative coccobacilli.  Goal of Therapy:  Resolution of infection  Plan:  1. Continue ceftazidime 1g IV q 12 hrs. 2. F/u cultures.  Albertina Parr, PharmD., BCPS Clinical Pharmacist Pager [phone]

## 2015-07-23 NOTE — Care Management Important Message (Signed)
Important Message  Patient Details  Name: Brian [name] MRN: [id number] Date of Birth: 10-28-1933   Medicare Important Message Given:  Yes    Prisha Hiley P Rochanda Harpham 07/23/2015, 2:39 PM

## 2015-07-23 NOTE — Progress Notes (Signed)
Subjective: Pt sitting up in chair. Mild discomfort RUQ Denies N/V  Allergies: Niaspan; Shellfish allergy; Statins; and Zetia  Medications:  Current facility-administered medications:  .  acetaminophen (TYLENOL) tablet 650 mg, 650 mg, Oral, Q4H PRN, Samella Parr, NP, 650 mg at 07/20/15 2335 .  alum & mag hydroxide-simeth (MAALOX/MYLANTA) 200-200-20 MG/5ML suspension 30 mL, 30 mL, Oral, Q6H PRN, Samella Parr, NP .  aspirin chewable tablet 81 mg, 81 mg, Oral, Daily, Kelvin Cellar, MD, 81 mg at 07/22/15 1024 .  cefTAZidime (FORTAZ) 1 g in dextrose 5 % 50 mL IVPB, 1 g, Intravenous, Q12H, Jessica C Carney, RPH, 1 g at 07/22/15 2246 .  enoxaparin (LOVENOX) injection 40 mg, 40 mg, Subcutaneous, Q24H, Kelvin Cellar, MD, 40 mg at 07/22/15 2144 .  finasteride (PROSCAR) tablet 5 mg, 5 mg, Oral, Daily, Samella Parr, NP, 5 mg at 07/22/15 1050 .  insulin aspart (novoLOG) injection 0-15 Units, 0-15 Units, Subcutaneous, TID WC, Kelvin Cellar, MD, 3 Units at 07/23/15 [phone] .  insulin detemir (LEVEMIR) injection 8 Units, 8 Units, Subcutaneous, Daily, Belkys A Regalado, MD, 8 Units at 07/22/15 1024 .  levalbuterol (XOPENEX) nebulizer solution 0.63 mg, 0.63 mg, Nebulization, TID, Kelvin Cellar, MD, 0.63 mg at 07/23/15 0907 .  levalbuterol (XOPENEX) nebulizer solution 0.63 mg, 0.63 mg, Nebulization, Q4H PRN, Kelvin Cellar, MD, 0.63 mg at 07/23/15 0649 .  metroNIDAZOLE (FLAGYL) IVPB 500 mg, 500 mg, Intravenous, Q8H, Carlyle Basques, MD, 500 mg at 07/23/15 0502 .  morphine 2 MG/ML injection 1 mg, 1 mg, Intravenous, Q3H PRN, Kelvin Cellar, MD, 1 mg at 07/23/15 0537 .  ondansetron (ZOFRAN) tablet 4 mg, 4 mg, Oral, Q6H PRN **OR** ondansetron (ZOFRAN) injection 4 mg, 4 mg, Intravenous, Q6H PRN, Samella Parr, NP .  oxyCODONE (Oxy IR/ROXICODONE) immediate release tablet 5 mg, 5 mg, Oral, Q6H PRN, Kelvin Cellar, MD, 5 mg at 07/21/15 1725 .  sodium bicarbonate 100 mEq in dextrose 5 % 1,000 mL  infusion, , Intravenous, Continuous, Kelvin Cellar, MD, Last Rate: 75 mL/hr at 07/23/15 0818 .  sodium chloride 0.9 % injection 3 mL, 3 mL, Intravenous, Q12H, Samella Parr, NP, 3 mL at 07/21/15 1033 .  tamsulosin (FLOMAX) capsule 0.4 mg, 0.4 mg, Oral, Daily, Samella Parr, NP, 0.4 mg at 07/22/15 1023    Vital Signs: BP 133/76 mmHg  Pulse 115  Temp(Src) 98.3 F (36.8 C) (Oral)  Resp 18  Ht 5\' 11"  (1.803 m)  Wt 174 lb 2.6 oz (79 kg)  BMI 24.30 kg/m2  SpO2 95%  Physical Exam  Constitutional: He is oriented to person, place, and time. No distress.  Cardiovascular: Normal rate, regular rhythm and normal heart sounds.   Pulmonary/Chest: Effort normal and breath sounds normal. No respiratory distress.  Abdominal: Soft.  RUQ drain intact, site clean Output mix of serous fluid(which may be flush) and old blood, about 125mL recorded yesterday  Neurological: He is alert and oriented to person, place, and time.  Skin: He is not diaphoretic.    Imaging: [place] Abscess Drain  07/21/2015  CLINICAL DATA:  81 year old male with right hepatic abscess EXAM: ULTRASOUND GUIDED ABSCESS DRAINAGE Date: 07/21/2015 PROCEDURE: 1. Ultrasound-guided placement of a transhepatic abscess drain into the hepatic abscess. Interventional Radiologist:  Criselda Peaches, MD ANESTHESIA/SEDATION: Moderate (conscious) sedation was used. 1 mg Versed, 50 mcg Fentanyl were administered intravenously. The patient's vital signs were monitored continuously by radiology nursing throughout the procedure. Sedation Time: 13 minutes MEDICATIONS: None additional.  Patient  is on broad-spectrum IV antibiotics. TECHNIQUE: Informed consent was obtained from the patient following explanation of the procedure, risks, benefits and alternatives. The patient understands, agrees and consents for the procedure. All questions were addressed. A time out was performed. The right upper quadrant was interrogated with ultrasound. A complex  multiloculated partially cystic structure is identified in the right hepatic lobe consistent with the clinical history of hepatic abscess. A suitable skin entry site was selected and marked. The region was sterilely prepped and draped in the standard fashion using chlorhexidine skin prep. Local anesthesia was attained by infiltration with 1% lidocaine. A small dermatotomy was made. Under real-time sonographic guidance, an 18 gauge 10 cm trocar needle was advanced into the most cystic portion of the collection. A 0.035 Amplatz wire was then coiled within the space. The skin tract was dilated to 37 [place] and a Cook 12 [place] all-purpose drainage catheter advanced over the wire and formed within the hepatic abscess. Aspiration yields approximately 15 mL purulent bloody fluid. A sample was sent for culture. The abscess catheter was then secured to the skin with 0 Prolene suture and an adhesive fixation device an attached to JP bulb suction. The patient tolerated the procedure well. COMPLICATIONS: None Estimated blood loss:  0 IMPRESSION: Technically successful placement of a 12 French drainage catheter into the right hepatic abscess. Aspiration yielded approximately 15 mL purulent bloody fluid a sample of which was sent for culture. Signed, Criselda Peaches, MD [facility] Radiology Electronically Signed   By: Jacqulynn Cadet M.D.   On: 07/21/2015 16:45     Labs:  CBC:  Recent Labs  07/20/15 0547 07/21/15 0256 07/22/15 0307 07/23/15 0456  WBC 18.1* 18.3* 13.6* 16.3*  HGB 9.1* 9.2* 8.8* 9.6*  HCT 25.8* 26.3* 25.3* 26.9*  PLT 223 255 281 365    COAGS:  Recent Labs  07/19/15 1625 07/21/15 0256  INR 1.51* 1.42  APTT 40* 41*    BMP:  Recent Labs  07/20/15 0547 07/21/15 0256 07/22/15 0307 07/23/15 0456  NA 139 139 140 143  K 3.3* 3.6 3.8 3.7  CL 113* 113* 114* 115*  CO2 19* 18* 15* 16*  GLUCOSE 79 124* 214* 155*  BUN 31* 25* 24*  27*  CALCIUM 7.9* 7.8* 7.7* 8.4*  CREATININE 1.93* 1.54* 1.45* 1.43*  GFRNONAA 31* 41* 44* 45*  GFRAA 36* 48* 51* 52*    LIVER FUNCTION TESTS:  Recent Labs  07/19/15 0025 07/19/15 1418 07/20/15 0547 07/22/15 0307  BILITOT 1.1 0.8 0.7 0.6  AST 125* 211* 179* 66*  ALT 90* 150* 142* 90*  ALKPHOS 51 79 60 61  PROT 5.7* 6.7 5.4* 5.4*  ALBUMIN 2.4* 2.6* 2.2* 1.9*    Assessment and Plan: Hepatic abscess s/p perc drain Continue flushes/drain care Cx pending, GPC on gramstain  SignedAscencion Dike 07/23/2015, 10:32 AM   I spent a total of 15 Minutes at the the patient's bedside AND on the patient's hospital floor or unit, greater than 50% of which was counseling/coordinating care for hepatic abscess drainage

## 2015-07-23 NOTE — Progress Notes (Signed)
[facility] PROGRESS NOTE  Brian [name] [id number] DOB: [birth year] DOA: 07/18/2015 PCP: [name], MD  Assessment/Plan: 1. Severe sepsis -Present on admission, evidenced by a temperature of 103, heart rate of 123, acute kidney injury, hypotension with systolic blood pressures in the 80s to 90s (having a history of hypertension), with elevated transaminases. -Initially his source of infection was unclear, as workup that included abdominal ultrasound, chest x-ray, urinalysis was non-revealing. -He had reported eating oysters earlier this week after which he developed multiple episodes of nausea and vomiting that was associated with diarrhea. This history makes Vibrio parahaemolyticus a concern, particularly since this organism can cause septicemia.  -Provided empiric coverage for Vibrio parahaemolyticus with doxycycline 100 mg IV twice a day and ceftriaxone 1 g IV every 24 hours until blood culture data is available. -On 07/20/2015: CT scan of abdomen and pelvis performed revealing 7.1 x 5.0 x 5.7 cm hypoattenuating lesion within the liver. Radiology reporting that this could represent an abscess or perhaps primary hepatic malignancy. This was further worked up with MRI of liver that demonstrated findings compatible with abscess collection.    -On 07/21/2015 blood cultures drawn from 07/18/2015 growing 1/2 gram-negative rods, identification and susceptibility testing are still in progress. Ceftriaxone changed to Fortaz 1 g IV every 12 hours, remains on doxycycline and Flagyl. Vancomycin discontinued. Interventional radiology placing drain catheter of liver abscess on this date. Gram stain showing gram positive cocci in pairs from abscess.  -On 07/22/2015 labs showing over all improvement with creatinine trending down to 1.45, elevated transaminases coming down (AST 66 and ALT 90), white count coming down to 13,600. He remained afebrile overnight. Overall I think he's doing better,  will need to follow his cultures to guide antibiotic treatment.  -Blood cultures reviewed on 07/23/2015, which are still in progress. Spoke to [name] of infectious disease who felt that a possible source may have been related to translocation of bacteria in setting of gastroenteritis. Unlikely that this represents Vibrio parahaemolyticus infection.   2.  Acute on chronic renal failure -Likely secondary to ATN in setting of hypotension or perhaps sepsis/critical illness -Previous labs performed earlier this year revealed creatinine between 1.4 1.7, initial lab work during this hospitalization revealed creatinine of 2.85 with BUN of 36.this improved with IV fluid administration, currently coming down to 2.36 on a.m. Lab work. -On 07/20/2015 creatinine trending down to 1.9 from 2.24, continue IV fluids -On 07/21/2015 lab work showing ongoing improvement to kidney function, creatinine trending down to 1.54 from 1.93 on 07/20/2015.  -On 07/22/2015 labs showing further improvement to kidney function, with Cr coming down to 1.45  3.  Elevated transaminases -Lab work showing AST of 125 with ALT of 9.0. His total bilirubin was 1.1 and alkaline phosphatase 51. Abdominal ultrasound was unrevealing -Further workup with CT scan of abdomen and pelvis without contrast showed a hypoattenuating lesion within the liver which could represent abscess. -S/P drainage of liver abscess on 07/21/2015.  -Labs on 07/22/2015 showing improvement to elevated liver enzymes   4.  Elevated troponin. -Lab work showing upward trend in his troponin to 0.30 -He presently denies chest pain. -I suspect related to demand ischemia in setting of critical illness -Troponins trending down.  5. Diabetes mellitus -Will perform Accu-Cheks every 4 hours with sliding scale coverage, continue Levemir -He presented with hyperglycemia likely caused by infection  -Stopping oral hypoglycemic medications, as patient presents with acute  kidney injury.   6.  History of hypertension. -He presented with hypotension  for which antihypertensive medications were discontinued  7.  Wheezing -I repeated a chest x-ray today which not show acute infiltrate/facilitation -His oxygen saturations remain in the upper 90s on room air, respiratory consulted, continue providing nebulizer treatment as needed  8.  DVT prophylaxis. Lovenox  Code Status: full code Family Communication: spoke to his son and updated him on patient's condition Disposition Plan: Awaiting cultures as this will guide therapy   Antibiotics:  Vancomycin started on 07/18/2015 stopped on 07/20/2015  Zosyn discontinued on 07/19/2015  Ceftriaxone started on 07/19/2015 stopped on 07/20/2015   Doxycycline started on 07/19/2015   [name] started on 07/20/2015  HPI/Subjective: Brian [name] is a 81 year old gentleman with a past medical history of diabetes mellitus, admitted to the medicine service on 07/18/2015 when he presented with complaints of generalized weakness, dizziness, chills that was social with hyperglycemia. He reports having several episodes of nausea and vomiting earlier this week after having oysters. Initial lab work revealed acute on chronic kidney injury having creatinine of 2.85 with BUN of 36. He was found to be febrile having a temperature of 103 and started on broad-spectrum IV antimicrobial therapy with vancomycin and Zosyn. Patient also remained hypotensive overnight with systolic blood pressures in the 80s to 90s.workup revealed an abdominal ultrasoundwhich did not reveal acute intra-abdominal pathology. Lab work did reveal elevated transaminases with AST of 125 and ALT of 90.other workup included a chest x-ray and urinalysis which was unremarkable. Patient was transferred to the stepdown unit on 07/19/2015 for closer monitoring.  Objective: Filed Vitals:   07/23/15 0513 07/23/15 1100  BP: 133/76 134/75  Pulse: 115 118  Temp: 98.3 F (36.8 C)  98.5 F (36.9 C)  Resp: 18 20    Intake/Output Summary (Last 24 hours) at 07/23/15 1623 Last data filed at 07/23/15 1554  Gross per 24 hour  Intake    900 ml  Output   1065 ml  Net   -165 ml   Filed Weights   07/19/15 0515 07/19/15 1531 07/22/15 2028  Weight: 76.114 kg (167 lb 12.8 oz) 79.3 kg (174 lb 13.2 oz) 79 kg (174 lb 2.6 oz)    Exam:   General: Has mild ongoing confusion, nontoxic appearing.   Cardiovascular: regular rate and rhythm normal S1-S2  Respiratory: patient having few bilateral expiratory wheezes, normal respiratory effort  Abdomen: on abdominal exam he did not have significant pain with palpation across generalized abdomen, bowel sounds were present, overall nonfocal. Drain in RUQ in place.   Musculoskeletal: no edema  Data Reviewed: Basic Metabolic Panel:  Recent Labs Lab 07/19/15 1418 07/20/15 0547 07/21/15 0256 07/22/15 0307 07/23/15 0456  NA 139 139 139 140 143  K 4.1 3.3* 3.6 3.8 3.7  CL 109 113* 113* 114* 115*  CO2 21* 19* 18* 15* 16*  GLUCOSE 161* 79 124* 214* 155*  BUN 41* 31* 25* 24* 27*  CREATININE 2.24* 1.93* 1.54* 1.45* 1.43*  CALCIUM 8.4* 7.9* 7.8* 7.7* 8.4*   Liver Function Tests:  Recent Labs Lab 07/18/15 0830 07/19/15 0025 07/19/15 1418 07/20/15 0547 07/22/15 0307  AST 101* 125* 211* 179* 66*  ALT 67* 90* 150* 142* 90*  ALKPHOS 34* 51 79 60 61  BILITOT 1.4* 1.1 0.8 0.7 0.6  PROT 7.0 5.7* 6.7 5.4* 5.4*  ALBUMIN 3.0* 2.4* 2.6* 2.2* 1.9*   No results for input(s): LIPASE, AMYLASE in the last 168 hours. No results for input(s): AMMONIA in the last 168 hours. CBC:  Recent Labs Lab 07/18/15 0830  07/19/15  1418 07/20/15 0547 07/21/15 0256 07/22/15 0307 07/23/15 0456  WBC 16.0*  < > 16.6* 18.1* 18.3* 13.6* 16.3*  NEUTROABS 12.4*  --   --   --   --   --   --   HGB 10.0*  < > 10.1* 9.1* 9.2* 8.8* 9.6*  HCT 29.2*  < > 29.2* 25.8* 26.3* 25.3* 26.9*  MCV 85.6  < > 84.1 83.0 83.2 84.1 81.8  PLT 237  < > 226 223 255  281 365  < > = values in this interval not displayed. Cardiac Enzymes:  Recent Labs Lab 07/18/15 1818 07/19/15 0025 07/19/15 1418 07/19/15 2145 07/20/15 0547  TROPONINI 0.15* 0.30* 1.08* 0.23* 0.32*   BNP (last 3 results) No results for input(s): BNP in the last 8760 hours.  ProBNP (last 3 results) No results for input(s): PROBNP in the last 8760 hours.  CBG:  Recent Labs Lab 07/22/15 1205 07/22/15 1642 07/22/15 2151 07/23/15 0748 07/23/15 1141  GLUCAP 219* 150* 97 157* 213*    Recent Results (from the past 240 hour(s))  Urine culture     Status: None   Collection Time: 07/18/15 12:03 PM  Result Value Ref Range Status   Specimen Description URINE, CLEAN CATCH  Final   Special Requests NONE  Final   Culture MULTIPLE SPECIES PRESENT, SUGGEST RECOLLECTION  Final   Report Status 07/19/2015 FINAL  Final  Culture, blood (routine x 2)     Status: None (Preliminary result)   Collection Time: 07/18/15 12:50 PM  Result Value Ref Range Status   Specimen Description BLOOD RIGHT ANTECUBITAL  Final   Special Requests BOTTLES DRAWN AEROBIC AND ANAEROBIC 10CC  Final   Culture  Setup Time   Final    GRAM NEGATIVE COCCOBACILLI ANAEROBIC BOTTLE ONLY CRITICAL RESULT CALLED TO, READ BACK BY AND VERIFIED WITH: A. DAVIS,RN AT 1157 ON [id number] BY S. YARBROUGH    Culture   Final    ANAEROBIC GRAM NEGATIVE COCCOBACILLI BETA LACTAMASE NEGATIVE    Report Status PENDING  Incomplete  Culture, blood (routine x 2)     Status: None   Collection Time: 07/18/15 12:53 PM  Result Value Ref Range Status   Specimen Description BLOOD LEFT HAND  Final   Special Requests BOTTLES DRAWN AEROBIC ONLY 3CC  Final   Culture NO GROWTH 5 DAYS  Final   Report Status 07/23/2015 FINAL  Final  Culture, blood (routine x 2)     Status: None (Preliminary result)   Collection Time: 07/19/15  2:10 PM  Result Value Ref Range Status   Specimen Description BLOOD RIGHT ANTECUBITAL  Final   Special Requests BOTTLES  DRAWN AEROBIC ONLY  10CC  Final   Culture NO GROWTH 4 DAYS  Final   Report Status PENDING  Incomplete  Culture, blood (routine x 2)     Status: None (Preliminary result)   Collection Time: 07/19/15  2:18 PM  Result Value Ref Range Status   Specimen Description BLOOD RIGHT HAND  Final   Special Requests IN PEDIATRIC BOTTLE  3CC  Final   Culture NO GROWTH 4 DAYS  Final   Report Status PENDING  Incomplete  MRSA PCR Screening     Status: None   Collection Time: 07/19/15  4:12 PM  Result Value Ref Range Status   MRSA by PCR NEGATIVE NEGATIVE Final    Comment:        The GeneXpert MRSA Assay (FDA approved for NASAL specimens only), is one component of a comprehensive  MRSA colonization surveillance program. It is not intended to diagnose MRSA infection nor to guide or monitor treatment for MRSA infections.   Culture, routine-abscess     Status: None (Preliminary result)   Collection Time: 07/21/15  2:42 PM  Result Value Ref Range Status   Specimen Description ABSCESS LIVER  Final   Special Requests NONE  Final   Gram Stain   Final    ABUNDANT WBC PRESENT, PREDOMINANTLY PMN NO SQUAMOUS EPITHELIAL CELLS SEEN MODERATE GRAM POSITIVE COCCI IN PAIRS Performed at [facility]    Culture   Final    NO GROWTH 1 DAY Performed at [facility]    Report Status PENDING  Incomplete     Studies: Dg Chest 1 View  07/23/2015  CLINICAL DATA:  Episodes of shortness of breath this morning. EXAM: CHEST 1 VIEW COMPARISON:  07/19/2015 FINDINGS: Film is made with shallow lung inflation. Low lung volumes accentuate bronchovascular markings. Suspect small bilateral pleural effusions. There is bibasilar atelectasis. Early bilateral lower lobe infiltrates are not entirely excluded. Right upper quadrant coil catheter noted. IMPRESSION: 1. Shallow lung inflation. 2. Bilateral pleural effusions and bibasilar atelectasis or infiltrates. Electronically Signed   By: Nolon Nations M.D.   On:  07/23/2015 10:54    Scheduled Meds: . aspirin  81 mg Oral Daily  . cefTAZidime (FORTAZ)  IV  1 g Intravenous Q12H  . enoxaparin (LOVENOX) injection  40 mg Subcutaneous Q24H  . finasteride  5 mg Oral Daily  . insulin aspart  0-15 Units Subcutaneous TID WC  . insulin detemir  8 Units Subcutaneous Daily  . levalbuterol  0.63 mg Nebulization TID  . metronidazole  500 mg Intravenous Q8H  . sodium chloride  3 mL Intravenous Q12H  . tamsulosin  0.4 mg Oral Daily   Continuous Infusions: .  sodium bicarbonate  infusion 1000 mL 75 mL/hr at 07/23/15 0818    Principal Problem:   Sepsis ([place]) Active Problems:   Hypertension goal BP (blood pressure) < 140/90   Hyperlipidemia LDL goal <100   Chronic kidney disease (CKD), stage III (moderate)   Anemia of chronic renal failure   Acute renal failure (ARF) (HCC)   Transaminitis   Diabetes mellitus, insulin dependent (IDDM), uncontrolled (HCC)   Leukocytosis   Acute kidney injury ([place])   SOB (shortness of breath)   Liver abscess   Fever and chills   Gram-negative bacteremia ([place])    Time spent: 25 min    Kelvin Cellar  [facility] Pager [phone]. If 7PM-7AM, please contact night-coverage at [email or web address], password [facility] 07/23/2015, 4:23 PM  LOS: 5 days

## 2015-07-24 ENCOUNTER — Inpatient Hospital Stay (HOSPITAL_COMMUNITY): Payer: Medicare Other

## 2015-07-24 ENCOUNTER — Encounter (HOSPITAL_COMMUNITY): Payer: Self-pay | Admitting: Radiology

## 2015-07-24 LAB — COMPREHENSIVE METABOLIC PANEL
ALK PHOS: 73 U/L (ref 38–126)
ALT: 55 U/L (ref 17–63)
ANION GAP: 10 (ref 5–15)
AST: 27 U/L (ref 15–41)
Albumin: 2 g/dL — ABNORMAL LOW (ref 3.5–5.0)
BUN: 28 mg/dL — ABNORMAL HIGH (ref 6–20)
CALCIUM: 8.6 mg/dL — AB (ref 8.9–10.3)
CO2: 22 mmol/L (ref 22–32)
Chloride: 114 mmol/L — ABNORMAL HIGH (ref 101–111)
Creatinine, Ser: 1.32 mg/dL — ABNORMAL HIGH (ref 0.61–1.24)
GFR calc non Af Amer: 50 mL/min — ABNORMAL LOW (ref >60)
GFR, EST AFRICAN AMERICAN: 58 mL/min — AB (ref >60)
Glucose, Bld: 260 mg/dL — ABNORMAL HIGH (ref 65–99)
POTASSIUM: 3.6 mmol/L (ref 3.5–5.1)
SODIUM: 146 mmol/L — AB (ref 135–145)
Total Bilirubin: 0.9 mg/dL (ref 0.3–1.2)
Total Protein: 5.8 g/dL — ABNORMAL LOW (ref 6.5–8.1)

## 2015-07-24 LAB — CULTURE, BLOOD (ROUTINE X 2)
CULTURE: NO GROWTH
Culture: NO GROWTH

## 2015-07-24 LAB — CBC
HEMATOCRIT: 28.2 % — AB (ref 39.0–52.0)
HEMOGLOBIN: 10.1 g/dL — AB (ref 13.0–17.0)
MCH: 28.9 pg (ref 26.0–34.0)
MCHC: 35.8 g/dL (ref 30.0–36.0)
MCV: 80.6 fL (ref 78.0–100.0)
Platelets: 463 10*3/uL — ABNORMAL HIGH (ref 150–400)
RBC: 3.5 MIL/uL — AB (ref 4.22–5.81)
RDW: 14.3 % (ref 11.5–15.5)
WBC: 19.9 10*3/uL — ABNORMAL HIGH (ref 4.0–10.5)

## 2015-07-24 LAB — DIFFERENTIAL
BASOS ABS: 0 10*3/uL (ref 0.0–0.1)
Band Neutrophils: 0 %
Basophils Relative: 0 %
Blasts: 0 %
Eosinophils Absolute: 0 10*3/uL (ref 0.0–0.7)
Eosinophils Relative: 0 %
LYMPHS ABS: 1 10*3/uL (ref 0.7–4.0)
Lymphocytes Relative: 5 %
MONO ABS: 1.8 10*3/uL — AB (ref 0.1–1.0)
MYELOCYTES: 0 %
Metamyelocytes Relative: 0 %
Monocytes Relative: 9 %
Neutro Abs: 17.1 10*3/uL — ABNORMAL HIGH (ref 1.7–7.7)
Neutrophils Relative %: 86 %
PROMYELOCYTES ABS: 0 %
nRBC: 0 /100 WBC

## 2015-07-24 LAB — GLUCOSE, CAPILLARY
GLUCOSE-CAPILLARY: 312 mg/dL — AB (ref 65–99)
Glucose-Capillary: 183 mg/dL — ABNORMAL HIGH (ref 65–99)
Glucose-Capillary: 163 mg/dL — ABNORMAL HIGH (ref 65–99)
Glucose-Capillary: 166 mg/dL — ABNORMAL HIGH (ref 65–99)

## 2015-07-24 MED ORDER — BARIUM SULFATE 2.1 % PO SUSP
ORAL | Status: AC
  Filled 2015-07-24: qty 2

## 2015-07-24 MED ORDER — IOHEXOL 300 MG/ML  SOLN
80.0000 mL | Freq: Once | INTRAMUSCULAR | Status: AC | PRN
  Administered 2015-07-24: 80 mL via INTRAVENOUS

## 2015-07-24 MED ORDER — INSULIN ASPART 100 UNIT/ML ~~LOC~~ SOLN
0.0000 [IU] | SUBCUTANEOUS | Status: DC
  Administered 2015-07-24 – 2015-07-25 (×3): 3 [IU] via SUBCUTANEOUS
  Administered 2015-07-25 (×2): 5 [IU] via SUBCUTANEOUS
  Administered 2015-07-25: 2 [IU] via SUBCUTANEOUS
  Administered 2015-07-25 – 2015-07-26 (×3): 3 [IU] via SUBCUTANEOUS
  Administered 2015-07-27: 2 [IU] via SUBCUTANEOUS
  Administered 2015-07-27: 3 [IU] via SUBCUTANEOUS
  Administered 2015-07-28 – 2015-07-29 (×3): 2 [IU] via SUBCUTANEOUS

## 2015-07-24 MED ORDER — INSULIN DETEMIR 100 UNIT/ML ~~LOC~~ SOLN
12.0000 [IU] | Freq: Every day | SUBCUTANEOUS | Status: DC
  Administered 2015-07-25: 12 [IU] via SUBCUTANEOUS
  Filled 2015-07-24: qty 0.12

## 2015-07-24 MED ORDER — PIPERACILLIN-TAZOBACTAM 3.375 G IVPB 30 MIN
3.3750 g | Freq: Once | INTRAVENOUS | Status: AC
  Administered 2015-07-24: 3.375 g via INTRAVENOUS
  Filled 2015-07-24: qty 50

## 2015-07-24 MED ORDER — BARIUM SULFATE 2.1 % PO SUSP
900.0000 mL | Freq: Once | ORAL | Status: AC
  Administered 2015-07-24: 900 mL via ORAL

## 2015-07-24 MED ORDER — LIDOCAINE VISCOUS 2 % MT SOLN
OROMUCOSAL | Status: AC
  Filled 2015-07-24: qty 15

## 2015-07-24 MED ORDER — PIPERACILLIN-TAZOBACTAM 3.375 G IVPB
3.3750 g | Freq: Three times a day (TID) | INTRAVENOUS | Status: DC
  Filled 2015-07-24 (×2): qty 50

## 2015-07-24 MED ORDER — INSULIN ASPART 100 UNIT/ML ~~LOC~~ SOLN: 3.0000 [IU] | Freq: Three times a day (TID) | SUBCUTANEOUS | Status: DC

## 2015-07-24 MED ORDER — SODIUM CHLORIDE 0.9 % IV SOLN
INTRAVENOUS | Status: DC
  Administered 2015-07-24: 08:00:00 via INTRAVENOUS
  Administered 2015-07-25: 1000 mL via INTRAVENOUS
  Administered 2015-07-26: via INTRAVENOUS

## 2015-07-24 MED ORDER — PIPERACILLIN-TAZOBACTAM 3.375 G IVPB
3.3750 g | Freq: Three times a day (TID) | INTRAVENOUS | Status: DC
  Administered 2015-07-25 – 2015-08-03 (×29): 3.375 g via INTRAVENOUS
  Filled 2015-07-24 (×30): qty 50

## 2015-07-24 NOTE — Progress Notes (Addendum)
Inpatient Diabetes Program Recommendations  AACE/ADA: New Consensus Statement on Inpatient Glycemic Control (2015)  Target Ranges:  Prepandial:   less than 140 mg/dL      Peak postprandial:   less than 180 mg/dL (1-2 hours)      Critically ill patients:  140 - 180 mg/dL    Results for Brian [name], Brian [name] (MRN [id number]) as of 07/24/2015 08:16  Ref. Range 07/23/2015 07:48 07/23/2015 11:41 07/23/2015 16:50 07/23/2015 20:24  Glucose-Capillary Latest Ref Range: 65-99 mg/dL 157 (H) 213 (H) 226 (H) 223 (H)    Results for Brian [name], Brian [name] (MRN [id number]) as of 07/24/2015 08:31  Ref. Range 07/24/2015 08:04  Glucose-Capillary Latest Ref Range: 65-99 mg/dL 312 (H)     Home DM Meds: Amaryl 2 mg bid       Levemir 8 units daily       Januvia 25 mg daily  Current Insulin Orders: Levemir 8 units daily                  Novolog Moderate SSI (0-15 units) TID AC    MD- Please consider the following in-hospital insulin adjustments:  1. Increase Levemir to 12 units daily  2. Start low dose Novolog Meal Coverage- Novolog 3 units tid with meals     --Will follow patient during hospitalization--  Wyn Quaker RN, MSN, CDE Diabetes Coordinator Inpatient Glycemic Control Team Team Pager: [phone] (8a-5p)

## 2015-07-24 NOTE — Progress Notes (Addendum)
[facility]    Date of Admission:  07/18/2015   Total days of antibiotics 7        Day 5 ceftaz        Day 34metro   ID: Brian [name] is a 81 y.o. male with sepsis due to anaerobic gram negative  bacteremia and liver abscess Principal Problem:   Sepsis ([place]) Active Problems:   Hypertension goal BP (blood pressure) < 140/90   Hyperlipidemia LDL goal <100   Chronic kidney disease (CKD), stage III (moderate)   Anemia of chronic renal failure   Acute renal failure (ARF) (HCC)   Transaminitis   Diabetes mellitus, insulin dependent (IDDM), uncontrolled (HCC)   Leukocytosis   Acute kidney injury ([place])   SOB (shortness of breath)   Liver abscess   Fever and chills   Gram-negative bacteremia (HCC)    Subjective: Abdominal discomfort, 350mL emesis this morning. Walked with PT and walker out onto the hall and back  Interval hx: abd xray shows ileus. 138mL out of drain yesterday. WBC up to 19K. Cr improved  Medications:  . aspirin  81 mg Oral Daily  . Barium Sulfate  900 mL Oral Once  . cefTAZidime (FORTAZ)  IV  1 g Intravenous Q12H  . enoxaparin (LOVENOX) injection  40 mg Subcutaneous Q24H  . finasteride  5 mg Oral Daily  . insulin aspart  0-15 Units Subcutaneous TID WC  . insulin detemir  8 Units Subcutaneous Daily  . levalbuterol  0.63 mg Nebulization TID  . metronidazole  500 mg Intravenous Q8H  . sodium chloride  3 mL Intravenous Q12H  . tamsulosin  0.4 mg Oral Daily    Objective: Vital signs in last 24 hours: Temp:  [98.5 F (36.9 C)-99.1 F (37.3 C)] 98.5 F (36.9 C) (11/29 0512) Pulse Rate:  [115-124] 124 (11/29 1015) Resp:  [18-20] 20 (11/29 1015) BP: (133-141)/(84-91) 135/88 mmHg (11/29 1015) SpO2:  [96 %-99 %] 96 % (11/29 1347) Physical Exam  Constitutional: sleeping intermittently but answers questions He appears well-developed and well-nourished. No distress.  HENT:  Mouth/Throat: Oropharynx is clear and moist. No  oropharyngeal exudate.  Cardiovascular: Normal rate, regular rhythm and normal heart sounds. Exam reveals no gallop and no friction rub.  No murmur heard.  Pulmonary/Chest: Effort normal and breath sounds normal. No respiratory distress. He has no wheezes.  Abdominal: distended, decreased bowel sounds.  There is no tenderness. Drain in place with blood clot and serous fluid in bulb Skin: Skin is warm and dry. No rash noted. No erythema.    Lab Results  Recent Labs  07/23/15 0456 07/24/15 0522  WBC 16.3* 19.9*  HGB 9.6* 10.1*  HCT 26.9* 28.2*  NA 143 146*  K 3.7 3.6  CL 115* 114*  CO2 16* 22  BUN 27* 28*  CREATININE 1.43* 1.32*   Liver Panel  Recent Labs  07/22/15 0307 07/24/15 0522  PROT 5.4* 5.8*  ALBUMIN 1.9* 2.0*  AST 66* 27  ALT 90* 55  ALKPHOS 61 73  BILITOT 0.6 0.9   Microbiology: 11/24 blood cx ngtd 11/23 blood cx in anaerobic cx GNCB 11/26 aspirate cx GPC Studies/Results: Dg Chest 1 View  07/23/2015  CLINICAL DATA:  Episodes of shortness of breath this morning. EXAM: CHEST 1 VIEW COMPARISON:  07/19/2015 FINDINGS: Film is made with shallow lung inflation. Low lung volumes accentuate bronchovascular markings. Suspect small bilateral pleural effusions. There is bibasilar atelectasis. Early bilateral lower lobe infiltrates are not  entirely excluded. Right upper quadrant coil catheter noted. IMPRESSION: 1. Shallow lung inflation. 2. Bilateral pleural effusions and bibasilar atelectasis or infiltrates. Electronically Signed   By: Nolon Nations M.D.   On: 07/23/2015 10:54   Dg Abd Acute W/chest  07/24/2015  CLINICAL DATA:  Abdominal pain and distention, onset tonight EXAM: DG ABDOMEN ACUTE W/ 1V CHEST COMPARISON:  07/21/2015 FINDINGS: There is abnormal dilatation and loop stacking of small bowel without significant mural thickening. No free intraperitoneal air. This may represent adynamic ileus. Obstruction not entirely excluded. The right hepatic lobe abscess  drainage catheter appears unchanged in position. There are mild linear opacities in both lung bases which are probably atelectatic. There probably is a small right pleural effusion. IMPRESSION: 1. Abnormal dilatation of small bowel. Adynamic ileus is more likely than mechanical obstruction. 2. Unchanged position of the liver abscess drainage catheter. 3. Atelectatic appearing basilar lung opacities bilaterally. Probable small right pleural effusion. Electronically Signed   By: Andreas Newport M.D.   On: 07/24/2015 07:04     Assessment/Plan: Liver abscess =currently has drain in place. No growth on culture, likely to being on antibiotics prior to drain placement  Anaerobic Gram negative bacteremia = micro unable to do further identification per discussion this morning, but will attempt to run specimen one more time in the morning for identification. Did find that it is beta lactamase negative.   Leukocytosis =  Increased over the last 2 days in the setting of ileus. No fevers  Nausea with vomiting = maybe due to metronidazole. Will change his abtx to piptazo to see if that helps his symptoms. Consider pretreatment with anti emetic such as zofran, when he gets abtx infusion  Ileus = agree with having ng to help decompress. Keep npo in the meantime  aki = appears improving with fluids received throughout admission  [facility], [facility] for Infectious Diseases Cell: [phone] Pager: [phone]  07/24/2015, 3:29 PM

## 2015-07-24 NOTE — Progress Notes (Signed)
ANTIBIOTIC CONSULT NOTE - INITIAL  Pharmacy Consult for Zosyn Indication: gram negative bacteremia with liver abscess  Patient Measurements: Height: 5\' 11"  (180.3 cm) Weight: 174 lb 2.6 oz (79 kg) IBW/kg (Calculated) : 75.3  Vital Signs: Temp: 99.2 F (37.3 C) (11/29 1626) Temp Source: Oral (11/29 1626) BP: 136/88 mmHg (11/29 1626) Pulse Rate: 112 (11/29 1626)    Labs:  Recent Labs  07/22/15 0307 07/23/15 0456 07/24/15 0522  WBC 13.6* 16.3* 19.9*  HGB 8.8* 9.6* 10.1*  PLT 281 365 463*  CREATININE 1.45* 1.43* 1.32*   Microbiology: Cx data:  11/23 blood:  Anaerobic GNR coccobacilli - B-lactamase negative 11/24 blood: ngF 11/26: liver abscess: ngtd  Anti-infective's  Zosyn: 11/23 - 11/24 - 11/29 >>  Ceftazidime: 11/25- 11/29  Assessment: 57 yoM with GNR bacteremia and liver abscess. WBC increasing on Ceftaz/Flagyl (along with reported nausea) so changing to Zosyn.  Goal of Therapy:  treatment of infection   Plan:  1. Zosyn 3.375 gm over 30 min followed by EIZ 3.375 gm Q8H 2. Following daily; notes prn   Vincenza Hews, PharmD, BCPS 07/24/2015, 5:15 PM Pager: [phone]

## 2015-07-24 NOTE — Progress Notes (Signed)
Physical Therapy Treatment Patient Details Name: Brian [name] MRN: [id number] DOB: [birth year] Today's Date: 07/24/2015    History of Present Illness Admitted with weakness, fever tachycardia, sepsis likely secondary to lover abscess, drain placed by interventional Radiology on 11/26; since drain placement overall status improving; PMH significant for DM    PT Comments    Pt con't to present with generalized fatigue and deconditioning however with improved ambulation tolerance this date. Pt was indep and living alone PTA but now requires minA and use of RW for safe amb. If 24/7 supervision can't be provided pt may need ST-SNF to achieve safe mod I level of function.  Follow Up Recommendations  Home health PT;Supervision/Assistance - 24 hour     Equipment Recommendations  Rolling walker with 5" wheels;3in1 (PT)    Recommendations for Other Services       Precautions / Restrictions Precautions Precautions: Fall Restrictions Weight Bearing Restrictions: No    Mobility  Bed Mobility Overal bed mobility: Needs Assistance Bed Mobility: Supine to Sit     Supine to sit: Supervision;HOB elevated     General bed mobility comments: slow moving, increased time  Transfers Overall transfer level: Needs assistance Equipment used: Rolling walker (2 wheeled) Transfers: Sit to/from Stand Sit to Stand: Min assist         General transfer comment: v/c's for safe hand placement  Ambulation/Gait Ambulation/Gait assistance: Min assist Ambulation Distance (Feet): 60 Feet Assistive device: Rolling walker (2 wheeled) Gait Pattern/deviations: Step-through pattern;Decreased stride length Gait velocity: slow   General Gait Details: v/c's to stand upright, mild SOB, easily fatigued   Stairs            Wheelchair Mobility    Modified Rankin (Stroke Patients Only)       Balance Overall balance assessment: Needs assistance           Standing balance-Leahy Scale:  Poor Standing balance comment: benefits from UE support                    Cognition Arousal/Alertness: Lethargic (but easily arousable) Behavior During Therapy: WFL for tasks assessed/performed Overall Cognitive Status: Within Functional Limits for tasks assessed                      Exercises General Exercises - Lower Extremity Ankle Circles/Pumps: AROM;Both;10 reps;Seated Long Arc Quad: AROM;Both;10 reps;Seated Hip Flexion/Marching: AROM;Both;10 reps;Seated    General Comments        Pertinent Vitals/Pain Pain Assessment: Faces Faces Pain Scale: Hurts little more Pain Location: R flank pain Pain Descriptors / Indicators: Aching;Sore Pain Intervention(s): Limited activity within patient's tolerance    Home Living                      Prior Function            PT Goals (current goals can now be found in the care plan section) Progress towards PT goals: Progressing toward goals    Frequency  Min 3X/week    PT Plan Current plan remains appropriate    Co-evaluation             End of Session Equipment Utilized During Treatment: Gait belt Activity Tolerance: Patient limited by fatigue Patient left: in chair;with call bell/phone within reach;with family/visitor present     Time: [id number] PT Time Calculation (min) (ACUTE ONLY): 31 min  Charges:  $Gait Training: 8-22 mins $Therapeutic Exercise: 8-22 mins  G CodesKingsley Callander 07/24/2015, 9:55 AM   Kittie Plater, PT, DPT Pager #: [phone] Office #: [phone]

## 2015-07-24 NOTE — Progress Notes (Signed)
Entered room for abx,patient vomited approx 300cc coffee ground emesis. Productive cough with tan sputum/blood streaked which was first time daughter has seen this. Abdomen distended but non tender except where  jp drain is located. NP notified of this episode and will relay to oncoming shift.

## 2015-07-24 NOTE — Progress Notes (Signed)
[facility] PROGRESS NOTE  Brian [name] [id number] DOB: 1934-02-10 DOA: 07/18/2015 PCP: [name], MD  Interim summary Brian [name] is a 81 year old gentleman with a past medical history of diabetes mellitus, admitted to the medicine service on 07/18/2015 when he presented with complaints of generalized weakness, dizziness, chills that was social with hyperglycemia. He reports having several episodes of nausea and vomiting earlier this week after having oysters. Initial lab work revealed acute on chronic kidney injury having creatinine of 2.85 with BUN of 36. He was found to be febrile having a temperature of 103 and started on broad-spectrum IV antimicrobial therapy with vancomycin and Zosyn. Patient also remained hypotensive overnight with systolic blood pressures in the 80s to 90s.workup revealed an abdominal ultrasoundwhich did not reveal acute intra-abdominal pathology. Lab work did reveal elevated transaminases with AST of 125 and ALT of 90.other workup included a chest x-ray and urinalysis which was unremarkable. Patient was transferred to the stepdown unit on 07/19/2015 for closer monitoring. CT scan of abdomen and pelvis performed on 07/20/2015 showed a hypoattenuating lesion within the liver. This was further worked up with an MRI of liver that showed findings compatible with abscess collection. Interventional radiology was consulted for drain placement that was performed on 07/21/2015. Blood cultures obtained on admission growing gram-negative bacilli, with repeat blood cultures show no growth to date. Cultures from abscess growing gram-positive cocci. With regard to etiology I initially suspected Vibrio parahaemolyticus with his history of GI illness after consuming oysters. Spoke with ID, appearance of bacteria on microscopic examination not consistent with vibrio. Awaiting organism identification and susceptibility testing.  Assessment/Plan: 1. Severe sepsis -Present on  admission, evidenced by a temperature of 103, heart rate of 123, acute kidney injury, hypotension with systolic blood pressures in the 80s to 90s (having a history of hypertension), with elevated transaminases. -Initially his source of infection was unclear, as workup that included abdominal ultrasound, chest x-ray, urinalysis was non-revealing. -He had reported eating oysters earlier this week after which he developed multiple episodes of nausea and vomiting that was associated with diarrhea. This history makes Vibrio parahaemolyticus a concern, particularly since this organism can cause septicemia.  -Provided empiric coverage for Vibrio parahaemolyticus with doxycycline 100 mg IV twice a day and ceftriaxone 1 g IV every 24 hours until blood culture data is available. -On 07/20/2015: CT scan of abdomen and pelvis performed revealing 7.1 x 5.0 x 5.7 cm hypoattenuating lesion within the liver. Radiology reporting that this could represent an abscess or perhaps primary hepatic malignancy. This was further worked up with MRI of liver that demonstrated findings compatible with abscess collection.    -On 07/21/2015 blood cultures drawn from 07/18/2015 growing 1/2 gram-negative rods, identification and susceptibility testing are still in progress. Ceftriaxone changed to Fortaz 1 g IV every 12 hours and Vancomycin discontinued. Interventional radiology placing drain catheter of liver abscess on this date. Gram stain showing gram positive cocci in pairs from abscess.  -On 07/22/2015 labs showing over all improvement with creatinine trending down to 1.45, elevated transaminases coming down (AST 66 and ALT 90), white count coming down to 13,600. He remained afebrile overnight. Overall I think he's doing better, will need to follow his cultures to guide antibiotic treatment.  -Blood cultures reviewed on 07/23/2015, which are still in progress. Spoke to [name] of infectious disease who felt that a possible source may  have been related to translocation of bacteria in setting of gastroenteritis. Unlikely that this represents Vibrio parahaemolyticus infection. -On 07/24/2015  white count continues to trend up 13.6 > 16.3 > 19.9 (latest) and appears ill and physical examination. Overnight he developed ileus. I am concerned with lack of improvement and lab work, have ordered a repeat CT scan of abdomen and pelvis. Results pending at the time of this dictation.   2.  Ileus -Overnight patient developed multiple episodes of nausea and vomiting and noted to have distended abdomen. This was worked up with abdominal x-ray that showed adynamic ileus. -This morning he remains symptomatic, will place NG tube. -Underlying infectious process could be the culprit.   3.  Acute on chronic renal failure -Likely secondary to ATN in setting of hypotension or perhaps sepsis/critical illness -Previous labs performed earlier this year revealed creatinine between 1.4 1.7, initial lab work during this hospitalization revealed creatinine of 2.85 with BUN of 36.this improved with IV fluid administration -Labs on 07/24/2015 showing ongoing improvement to his kidney function with creatinine coming down to 1.32 with BUN of 28.  4.  Elevated transaminases -Lab work showing AST of 125 with ALT of 9.0. His total bilirubin was 1.1 and alkaline phosphatase 51. Abdominal ultrasound was unrevealing -Further workup with CT scan of abdomen and pelvis without contrast showed a hypoattenuating lesion within the liver which could represent abscess. -S/P drainage of liver abscess on 07/21/2015.  -Labs showing improvement to elevated liver enzymes   5.  Elevated troponin. -Lab work showing upward trend in his troponin to 0.30 -He presently denies chest pain. -I suspect related to demand ischemia in setting of critical illness -Troponins trending down.  6. Diabetes mellitus -His blood sugars increased into the 300 range which could be an indication  of worsening infection. -Diabetic coordinator recommending increasing Levemir to 12 units daily   6.  History of hypertension. -He presented with hypotension for which antihypertensive medications were discontinued  7.  Wheezing -I repeated a chest x-ray today which not show acute infiltrate/facilitation -His oxygen saturations remain in the upper 90s on room air, respiratory consulted, continue providing nebulizer treatment as needed  8.  DVT prophylaxis. Lovenox  Code Status: full code Family Communication: I spoke with his daughter who was present at bedside Disposition Plan: Awaiting cultures as this will guide therapy, have ordered a CT scan of abdomen and pelvis given concerns for worsening infection   Antibiotics:  Vancomycin started on 07/18/2015 stopped on 07/20/2015  Zosyn discontinued on 07/19/2015  Ceftriaxone started on 07/19/2015 stopped on 07/20/2015   Doxycycline started on 07/19/2015   [name] started on 07/20/2015  HPI/Subjective: Patient states feeling ill this morning he reports having a rough night, having multiple episodes of nausea and vomiting.  Objective: Filed Vitals:   07/24/15 0512 07/24/15 1015  BP: 141/91 135/88  Pulse: 119 124  Temp: 98.5 F (36.9 C)   Resp: 20 20    Intake/Output Summary (Last 24 hours) at 07/24/15 1614 Last data filed at 07/24/15 1542  Gross per 24 hour  Intake 2351.25 ml  Output    775 ml  Net 1576.25 ml   Filed Weights   07/19/15 0515 07/19/15 1531 07/22/15 2028  Weight: 76.114 kg (167 lb 12.8 oz) 79.3 kg (174 lb 13.2 oz) 79 kg (174 lb 2.6 oz)    Exam:   General: Patient is ill-appearing. States not doing well.   Cardiovascular: regular rate and rhythm normal S1-S2  Respiratory: patient having few bilateral expiratory wheezes, normal respiratory effort  Abdomen: Abdomen is distended, tympanic, having generalized tenderness to palpation.  Musculoskeletal: no edema  Data Reviewed: Basic Metabolic  Panel:  Recent Labs Lab 07/20/15 0547 07/21/15 0256 07/22/15 0307 07/23/15 0456 07/24/15 0522  NA 139 139 140 143 146*  K 3.3* 3.6 3.8 3.7 3.6  CL 113* 113* 114* 115* 114*  CO2 19* 18* 15* 16* 22  GLUCOSE 79 124* 214* 155* 260*  BUN 31* 25* 24* 27* 28*  CREATININE 1.93* 1.54* 1.45* 1.43* 1.32*  CALCIUM 7.9* 7.8* 7.7* 8.4* 8.6*   Liver Function Tests:  Recent Labs Lab 07/19/15 0025 07/19/15 1418 07/20/15 0547 07/22/15 0307 07/24/15 0522  AST 125* 211* 179* 66* 27  ALT 90* 150* 142* 90* 55  ALKPHOS 51 79 60 61 73  BILITOT 1.1 0.8 0.7 0.6 0.9  PROT 5.7* 6.7 5.4* 5.4* 5.8*  ALBUMIN 2.4* 2.6* 2.2* 1.9* 2.0*   No results for input(s): LIPASE, AMYLASE in the last 168 hours. No results for input(s): AMMONIA in the last 168 hours. CBC:  Recent Labs Lab 07/18/15 0830  07/20/15 0547 07/21/15 0256 07/22/15 0307 07/23/15 0456 07/24/15 0522  WBC 16.0*  < > 18.1* 18.3* 13.6* 16.3* 19.9*  NEUTROABS 12.4*  --   --   --   --   --   --   HGB 10.0*  < > 9.1* 9.2* 8.8* 9.6* 10.1*  HCT 29.2*  < > 25.8* 26.3* 25.3* 26.9* 28.2*  MCV 85.6  < > 83.0 83.2 84.1 81.8 80.6  PLT 237  < > 223 255 281 365 463*  < > = values in this interval not displayed. Cardiac Enzymes:  Recent Labs Lab 07/18/15 1818 07/19/15 0025 07/19/15 1418 07/19/15 2145 07/20/15 0547  TROPONINI 0.15* 0.30* 1.08* 0.23* 0.32*   BNP (last 3 results) No results for input(s): BNP in the last 8760 hours.  ProBNP (last 3 results) No results for input(s): PROBNP in the last 8760 hours.  CBG:  Recent Labs Lab 07/23/15 1141 07/23/15 1650 07/23/15 2024 07/24/15 0804 07/24/15 1150  GLUCAP 213* 226* 223* 312* 183*    Recent Results (from the past 240 hour(s))  Urine culture     Status: None   Collection Time: 07/18/15 12:03 PM  Result Value Ref Range Status   Specimen Description URINE, CLEAN CATCH  Final   Special Requests NONE  Final   Culture MULTIPLE SPECIES PRESENT, SUGGEST RECOLLECTION  Final    Report Status 07/19/2015 FINAL  Final  Culture, blood (routine x 2)     Status: None (Preliminary result)   Collection Time: 07/18/15 12:50 PM  Result Value Ref Range Status   Specimen Description BLOOD RIGHT ANTECUBITAL  Final   Special Requests BOTTLES DRAWN AEROBIC AND ANAEROBIC 10CC  Final   Culture  Setup Time   Final    GRAM NEGATIVE COCCOBACILLI ANAEROBIC BOTTLE ONLY CRITICAL RESULT CALLED TO, READ BACK BY AND VERIFIED WITH: A. DAVIS,RN AT 1157 ON 112516 BY S. YARBROUGH    Culture   Final    ANAEROBIC GRAM NEGATIVE COCCOBACILLI BETA LACTAMASE NEGATIVE    Report Status PENDING  Incomplete  Culture, blood (routine x 2)     Status: None   Collection Time: 07/18/15 12:53 PM  Result Value Ref Range Status   Specimen Description BLOOD LEFT HAND  Final   Special Requests BOTTLES DRAWN AEROBIC ONLY 3CC  Final   Culture NO GROWTH 5 DAYS  Final   Report Status 07/23/2015 FINAL  Final  Culture, blood (routine x 2)     Status: None   Collection Time: 07/19/15  2:10 PM  Result Value Ref Range Status   Specimen Description BLOOD RIGHT ANTECUBITAL  Final   Special Requests BOTTLES DRAWN AEROBIC ONLY  10CC  Final   Culture NO GROWTH 5 DAYS  Final   Report Status 07/24/2015 FINAL  Final  Culture, blood (routine x 2)     Status: None   Collection Time: 07/19/15  2:18 PM  Result Value Ref Range Status   Specimen Description BLOOD RIGHT HAND  Final   Special Requests IN PEDIATRIC BOTTLE  3CC  Final   Culture NO GROWTH 5 DAYS  Final   Report Status 07/24/2015 FINAL  Final  MRSA PCR Screening     Status: None   Collection Time: 07/19/15  4:12 PM  Result Value Ref Range Status   MRSA by PCR NEGATIVE NEGATIVE Final    Comment:        The GeneXpert MRSA Assay (FDA approved for NASAL specimens only), is one component of a comprehensive MRSA colonization surveillance program. It is not intended to diagnose MRSA infection nor to guide or monitor treatment for MRSA infections.    Culture, routine-abscess     Status: None (Preliminary result)   Collection Time: 07/21/15  2:42 PM  Result Value Ref Range Status   Specimen Description ABSCESS LIVER  Final   Special Requests NONE  Final   Gram Stain   Final    ABUNDANT WBC PRESENT, PREDOMINANTLY PMN NO SQUAMOUS EPITHELIAL CELLS SEEN MODERATE GRAM POSITIVE COCCI IN PAIRS Performed at [facility]    Culture   Final    NO GROWTH 2 DAYS Performed at [facility]    Report Status PENDING  Incomplete     Studies: Dg Chest 1 View  07/23/2015  CLINICAL DATA:  Episodes of shortness of breath this morning. EXAM: CHEST 1 VIEW COMPARISON:  07/19/2015 FINDINGS: Film is made with shallow lung inflation. Low lung volumes accentuate bronchovascular markings. Suspect small bilateral pleural effusions. There is bibasilar atelectasis. Early bilateral lower lobe infiltrates are not entirely excluded. Right upper quadrant coil catheter noted. IMPRESSION: 1. Shallow lung inflation. 2. Bilateral pleural effusions and bibasilar atelectasis or infiltrates. Electronically Signed   By: Nolon Nations M.D.   On: 07/23/2015 10:54   Dg Abd Acute W/chest  07/24/2015  CLINICAL DATA:  Abdominal pain and distention, onset tonight EXAM: DG ABDOMEN ACUTE W/ 1V CHEST COMPARISON:  07/21/2015 FINDINGS: There is abnormal dilatation and loop stacking of small bowel without significant mural thickening. No free intraperitoneal air. This may represent adynamic ileus. Obstruction not entirely excluded. The right hepatic lobe abscess drainage catheter appears unchanged in position. There are mild linear opacities in both lung bases which are probably atelectatic. There probably is a small right pleural effusion. IMPRESSION: 1. Abnormal dilatation of small bowel. Adynamic ileus is more likely than mechanical obstruction. 2. Unchanged position of the liver abscess drainage catheter. 3. Atelectatic appearing basilar lung opacities bilaterally.  Probable small right pleural effusion. Electronically Signed   By: Andreas Newport M.D.   On: 07/24/2015 07:04    Scheduled Meds: . aspirin  81 mg Oral Daily  . Barium Sulfate  900 mL Oral Once  . Barium Sulfate      . cefTAZidime (FORTAZ)  IV  1 g Intravenous Q12H  . enoxaparin (LOVENOX) injection  40 mg Subcutaneous Q24H  . finasteride  5 mg Oral Daily  . insulin aspart  0-15 Units Subcutaneous TID WC  . insulin aspart  3 Units Subcutaneous TID WC  . [  START ON 07/25/2015] insulin detemir  12 Units Subcutaneous Daily  . levalbuterol  0.63 mg Nebulization TID  . lidocaine      . metronidazole  500 mg Intravenous Q8H  . sodium chloride  3 mL Intravenous Q12H  . tamsulosin  0.4 mg Oral Daily   Continuous Infusions: . sodium chloride 75 mL/hr at 07/24/15 0759    Principal Problem:   Sepsis ([place]) Active Problems:   Hypertension goal BP (blood pressure) < 140/90   Hyperlipidemia LDL goal <100   Chronic kidney disease (CKD), stage III (moderate)   Anemia of chronic renal failure   Acute renal failure (ARF) (HCC)   Transaminitis   Diabetes mellitus, insulin dependent (IDDM), uncontrolled (HCC)   Leukocytosis   Acute kidney injury ([place])   SOB (shortness of breath)   Liver abscess   Fever and chills   Gram-negative bacteremia ([place])    Time spent: 35 min    Kelvin Cellar  [facility] Pager [phone]. If 7PM-7AM, please contact night-coverage at [email or web address], password [facility] 07/24/2015, 4:14 PM  LOS: 6 days

## 2015-07-24 NOTE — Progress Notes (Signed)
RN attempted to insert NG tube with assistance from charge nurse. Attempts unsuccessful. MD paged to make aware. Will continue to assess and monitor pt   Brian [name]

## 2015-07-25 ENCOUNTER — Telehealth: Payer: Self-pay | Admitting: Family Medicine

## 2015-07-25 LAB — BASIC METABOLIC PANEL
Anion gap: 10 (ref 5–15)
BUN: 32 mg/dL — AB (ref 6–20)
CO2: 25 mmol/L (ref 22–32)
Calcium: 8.4 mg/dL — ABNORMAL LOW (ref 8.9–10.3)
Chloride: 114 mmol/L — ABNORMAL HIGH (ref 101–111)
Creatinine, Ser: 1.47 mg/dL — ABNORMAL HIGH (ref 0.61–1.24)
GFR, EST AFRICAN AMERICAN: 51 mL/min — AB (ref >60)
GFR, EST NON AFRICAN AMERICAN: 44 mL/min — AB (ref >60)
Glucose, Bld: 186 mg/dL — ABNORMAL HIGH (ref 65–99)
POTASSIUM: 3.8 mmol/L (ref 3.5–5.1)
SODIUM: 149 mmol/L — AB (ref 135–145)

## 2015-07-25 LAB — CBC
HEMATOCRIT: 29.5 % — AB (ref 39.0–52.0)
Hemoglobin: 10.1 g/dL — ABNORMAL LOW (ref 13.0–17.0)
MCH: 28 pg (ref 26.0–34.0)
MCHC: 34.2 g/dL (ref 30.0–36.0)
MCV: 81.7 fL (ref 78.0–100.0)
PLATELETS: 562 10*3/uL — AB (ref 150–400)
RBC: 3.61 MIL/uL — AB (ref 4.22–5.81)
RDW: 14.1 % (ref 11.5–15.5)
WBC: 20.8 10*3/uL — AB (ref 4.0–10.5)

## 2015-07-25 LAB — GLUCOSE, CAPILLARY
GLUCOSE-CAPILLARY: 180 mg/dL — AB (ref 65–99)
GLUCOSE-CAPILLARY: 175 mg/dL — AB (ref 65–99)
GLUCOSE-CAPILLARY: 137 mg/dL — AB (ref 65–99)
Glucose-Capillary: 167 mg/dL — ABNORMAL HIGH (ref 65–99)
Glucose-Capillary: 212 mg/dL — ABNORMAL HIGH (ref 65–99)
Glucose-Capillary: 233 mg/dL — ABNORMAL HIGH (ref 65–99)

## 2015-07-25 LAB — CULTURE, BLOOD (ROUTINE X 2)

## 2015-07-25 MED ORDER — WHITE PETROLATUM GEL
Status: AC
  Administered 2015-07-25: 21:00:00
  Filled 2015-07-25: qty 1

## 2015-07-25 MED ORDER — INSULIN DETEMIR 100 UNIT/ML ~~LOC~~ SOLN
6.0000 [IU] | Freq: Every day | SUBCUTANEOUS | Status: DC
  Administered 2015-07-26 – 2015-08-04 (×10): 6 [IU] via SUBCUTANEOUS
  Filled 2015-07-25 (×10): qty 0.06

## 2015-07-25 NOTE — Progress Notes (Signed)
[facility] for Infectious Disease    Date of Admission:  07/18/2015   Total days of antibiotics 8        Day 2 piptazo   ID: Brian [name] is a 81 y.o. male with sepsis due to anaerobic gram negative  bacteremia and liver abscess Principal Problem:   Sepsis ([place]) Active Problems:   Hypertension goal BP (blood pressure) < 140/90   Hyperlipidemia LDL goal <100   Chronic kidney disease (CKD), stage III (moderate)   Anemia of chronic renal failure   Acute renal failure (ARF) (HCC)   Transaminitis   Diabetes mellitus, insulin dependent (IDDM), uncontrolled (HCC)   Leukocytosis   Acute kidney injury ([place])   SOB (shortness of breath)   Liver abscess   Fever and chills   Gram-negative bacteremia (HCC)    Subjective: Abdominal discomfort is improved slightly. Less nasuea, no vomiting. He is passing gas, no bowel mvmt  Interval hx: underwent NG placement and repeat ABD CT  Medications:  . aspirin  81 mg Oral Daily  . enoxaparin (LOVENOX) injection  40 mg Subcutaneous Q24H  . finasteride  5 mg Oral Daily  . insulin aspart  0-15 Units Subcutaneous 6 times per day  . [START ON 07/26/2015] insulin detemir  6 Units Subcutaneous Daily  . levalbuterol  0.63 mg Nebulization TID  . piperacillin-tazobactam (ZOSYN)  IV  3.375 g Intravenous Q8H  . sodium chloride  3 mL Intravenous Q12H  . tamsulosin  0.4 mg Oral Daily    Objective: Vital signs in last 24 hours: Temp:  [98 F (36.7 C)-99.1 F (37.3 C)] 98 F (36.7 C) (11/30 1538) Pulse Rate:  [105-119] 109 (11/30 1538) Resp:  [17-18] 17 (11/30 1538) BP: (96-158)/(69-93) 96/69 mmHg (11/30 1538) SpO2:  [92 %-99 %] 99 % (11/30 1538) Weight:  [174 lb 2.6 oz (79 kg)] 174 lb 2.6 oz (79 kg) (11/29 2034) Physical Exam  Constitutional: sitting up in chair. He appears well-developed and well-nourished. No distress.  HENT: ng in place Mouth/Throat: Oropharynx is clear and moist. No oropharyngeal exudate.  Cardiovascular: Normal  rate, regular rhythm and normal heart sounds. Exam reveals no gallop and no friction rub.  No murmur heard.  Pulmonary/Chest: Effort normal and breath sounds normal. No respiratory distress. He has no wheezes.  Abdominal: distended, no bowel sounds.  There is no tenderness. Drain in place with blood clot and serous fluid in bulb Skin: Skin is warm and dry. No rash noted. No erythema.    Lab Results  Recent Labs  07/24/15 0522 07/25/15 0633  WBC 19.9* 20.8*  HGB 10.1* 10.1*  HCT 28.2* 29.5*  NA 146* 149*  K 3.6 3.8  CL 114* 114*  CO2 22 25  BUN 28* 32*  CREATININE 1.32* 1.47*   Liver Panel  Recent Labs  07/24/15 0522  PROT 5.8*  ALBUMIN 2.0*  AST 27  ALT 55  ALKPHOS 73  BILITOT 0.9   Microbiology: 11/24 blood cx ngtd 11/23 blood cx in anaerobic cx GNCB 11/26 aspirate cx GPC Studies/Results: Dg Abd 1 View  07/24/2015  CLINICAL DATA:  Nasogastric tube placement EXAM: ABDOMEN - 1 VIEW COMPARISON:  None. FINDINGS: The tip of the nasogastric tube is in the fundus of the stomach. Abscess strain projects over the right upper quadrant. Visualize lungs are grossly clear. Normal heart size. Gas-filled loops of bowel project over the upper abdomen. IMPRESSION: NG tube placement into the fundus of the stomach by radiological technologist. Electronically Signed  By: Marybelle Killings M.D.   On: 07/24/2015 17:55   Ct Abdomen Pelvis W Contrast  07/24/2015  CLINICAL DATA:  Sepsis, liver abscess, drain placement, increasing white count now 19,000, with plain film evidence of ileus EXAM: CT ABDOMEN AND PELVIS WITH CONTRAST TECHNIQUE: Multidetector CT imaging of the abdomen and pelvis was performed using the standard protocol following bolus administration of intravenous contrast. CONTRAST:  69mL OMNIPAQUE IOHEXOL 300 MG/ML  SOLN COMPARISON:  07/20/2015 FINDINGS: A small left and moderate right pleural effusion is present. Both are increased when compared to the prior study. There is  anticipated bilateral dependent consolidation most consistent with atelectasis. An NG tube extends into the stomach. Known liver abscess is again identified as an area of heterogeneous low attenuation with internal enhancing septations and gas. A drainage catheter passes through the abscess via percutaneous placement. Previously cross sectional diameter was 7.1 x 5.0 cm. Essentially unchanged measurements are obtained today. Spleen and pancreas are normal. Adrenal glands show mild fullness, stable. There are bilateral renal cysts. There is a 2 mm nonobstructing stone lower pole left kidney. Bladder is normal. There is fluid into the rectum. There is diverticulosis of the descending and sigmoid colon. There is fluid throughout small bowel. There is mild proximal small bowel dilatation to 3.7 cm, although there is no abrupt cut off or caliber change to suggest obstruction. Appendix appears normal. There is evidence of moderate anasarca. There is a small volume of ascites in the abdomen and pelvis. There are no acute musculoskeletal findings. IMPRESSION: Findings most consistent with small bowel ileus. Extensive left colon diverticulosis. Increased bilateral pleural effusions. Anasarca with small but increased volume of ascites. Drainage catheter again passes through right hepatic abscess. Abscess is unchanged in size. There is now all multifocal air within the abscess. This could represent necrosis or may be related to the presence of drainage catheter which has been placed in the interval. Electronically Signed   By: Skipper Cliche M.D.   On: 07/24/2015 22:34   Dg Abd Acute W/chest  07/24/2015  CLINICAL DATA:  Abdominal pain and distention, onset tonight EXAM: DG ABDOMEN ACUTE W/ 1V CHEST COMPARISON:  07/21/2015 FINDINGS: There is abnormal dilatation and loop stacking of small bowel without significant mural thickening. No free intraperitoneal air. This may represent adynamic ileus. Obstruction not entirely  excluded. The right hepatic lobe abscess drainage catheter appears unchanged in position. There are mild linear opacities in both lung bases which are probably atelectatic. There probably is a small right pleural effusion. IMPRESSION: 1. Abnormal dilatation of small bowel. Adynamic ileus is more likely than mechanical obstruction. 2. Unchanged position of the liver abscess drainage catheter. 3. Atelectatic appearing basilar lung opacities bilaterally. Probable small right pleural effusion. Electronically Signed   By: Andreas Newport M.D.   On: 07/24/2015 07:04   Dg Addison Bailey G Tube Plc W/fl-no Rad  07/24/2015  CLINICAL DATA:  NASO G TUBE PLACEMENT WITH FLUORO Fluoroscopy was utilized by the requesting physician.  No radiographic interpretation.     Assessment/Plan: Liver abscess =currently has drain in place. No growth on culture, likely to being on antibiotics prior to drain placemen. Repeat imaging shows slight decrease but agree it would be too early to see any significant change.  Anaerobic Gram negative bacteremia = micro unable to do further identification. Did find that it is beta lactamase negative. Currently on piptazo. Had d/c'd metronidazole as thought it contributed to nausea   Leukocytosis =  Increased over the last 3 days in  the setting of ileus. No fevers  Nausea with vomiting = yesterday changed his abtx to piptazo to see if that helps his symptoms. Consider pretreatment with anti emetic such as zofran, when he gets abtx infusion  Ileus = agree with having ng to help decompress. Keep npo in the meantime, encouraged that he is passing gas  aki = appears improving with fluids received throughout admission  Dr Johnnye Sima to see tomorrow  Baxter Flattery [facility] for Infectious Diseases Cell: [phone] Pager: [phone]  07/25/2015, 4:37 PM

## 2015-07-25 NOTE — Telephone Encounter (Signed)
I am following the hospital notes.

## 2015-07-25 NOTE — Progress Notes (Signed)
[facility] PROGRESS NOTE  ALISTAR HRABAK [id number] DOB: [birth year] DOA: 07/18/2015 PCP: [name], MD  Interim summary Mr. Rigano is a 81 year old gentleman with a past medical history of diabetes mellitus, admitted to the medicine service on 07/18/2015 when he presented with complaints of generalized weakness, dizziness, chills that was social with hyperglycemia. He reports having several episodes of nausea and vomiting earlier this week after having oysters. Initial lab work revealed acute on chronic kidney injury having creatinine of 2.85 with BUN of 36. He was found to be febrile having a temperature of 103 and started on broad-spectrum IV antimicrobial therapy with vancomycin and Zosyn. Patient also remained hypotensive overnight with systolic blood pressures in the 80s to 90s.workup revealed an abdominal ultrasoundwhich did not reveal acute intra-abdominal pathology. Lab work did reveal elevated transaminases with AST of 125 and ALT of 90.other workup included a chest x-ray and urinalysis which was unremarkable. Patient was transferred to the stepdown unit on 07/19/2015 for closer monitoring. CT scan of abdomen and pelvis performed on 07/20/2015 showed a hypoattenuating lesion within the liver. This was further worked up with an MRI of liver that showed findings compatible with abscess collection. Interventional radiology was consulted for drain placement that was performed on 07/21/2015. Blood cultures obtained on admission growing gram-negative bacilli, with repeat blood cultures show no growth to date. Cultures from abscess growing gram-positive cocci.  Awaiting organism identification and susceptibility testing.  Assessment/Plan: 1. Severe sepsis -improving -He had reported eating oysters past week after which he developed multiple episodes of nausea and vomiting that was associated with diarrhea. Vibrio parahaemolyticus was considered -Provided empiric coverage for Vibrio  parahaemolyticus with doxycycline 100 mg IV twice a day and ceftriaxone 1 g IV every 24 hours until blood culture data is available. -On 07/20/2015: CT scan of abdomen and pelvis performed revealing 7.1 x 5.0 x 5.7 cm hypoattenuating lesion within the liver. Radiology reporting that this could represent an abscess or perhaps primary hepatic malignancy. This was further worked up with MRI of liver that demonstrated findings compatible with abscess collection.    -On 07/21/2015 blood cultures drawn from 07/18/2015 growing 1/2 gram-negative rods, identification and susceptibility testing are still in progress. Ceftriaxone changed to Fortaz 1 g IV every 12 hours and Vancomycin discontinued. Interventional radiology placed drain catheter of liver abscess on this date. Gram stain showing gram positive cocci in pairs from abscess.  -Dr. Baxter Flattery of infectious disease, consulting, suspects that a possible source may have been related to translocation of bacteria in setting of gastroenteritis. Unlikely to be Vibrio parahaemolyticus infection. -CT 11/29 without change in size of abscess -i think he will need a colonoscopy down the road, wife reports last one >5-10years ago -reportedly normal   2.  Ileus -Overnight 11/29, patient developed multiple episodes of nausea and vomiting and noted to have distended abdomen,  abdominal x-ray that showed adynamic ileus. -continue NG decompression, NPO, IVF, KUb in am -Underlying infectious process could be the culprit.   3.  Acute on chronic renal failure -Likely secondary to ATN in setting of hypotension or perhaps sepsis/critical illness -baseline between 1.4 1.7,on admit creatinine of 2.85 with BUN of 36 -improving with NS, back to baseline  4.  Elevated transaminases -due to abscesses most likely -S/P drainage of liver abscess on 07/21/2015.  -Labs showing improvement to elevated liver enzymes   5.  Elevated troponin. -Lab work showing upward trend in his  troponin to 0.30, no chest pain. -related to demand ischemia in  setting of critical illness, Troponins trending down.  6. Diabetes mellitus -cut down lantus, due to NPO, ileus  6.  History of hypertension. -He presented with hypotension for which antihypertensive medications were discontinued   DVT prophylaxis. Lovenox  Code Status: full code Family Communication: wife at bedside Disposition Plan: pending improvement in ileus and abscesses   Antibiotics:  Vancomycin started on 07/18/2015 stopped on 07/20/2015  Zosyn discontinued on 07/19/2015  Ceftriaxone started on 07/19/2015 stopped on 07/20/2015   Doxycycline started on 07/19/2015   [name] started on 07/20/2015  HPI/Subjective: Belly better, but no flatus or gas, wants to drink  Objective: Filed Vitals:   07/25/15 0443 07/25/15 0831  BP: 158/93 150/89  Pulse: 119 105  Temp: 98.3 F (36.8 C) 98.6 F (37 C)  Resp: 17 18    Intake/Output Summary (Last 24 hours) at 07/25/15 1258 Last data filed at 07/25/15 1030  Gross per 24 hour  Intake 2377.5 ml  Output   1605 ml  Net  772.5 ml   Filed Weights   07/19/15 1531 07/22/15 2028 07/24/15 2034  Weight: 79.3 kg (174 lb 13.2 oz) 79 kg (174 lb 2.6 oz) 79 kg (174 lb 2.6 oz)    Exam:   General:  ill-appearing, AAOx3, no distress.   Cardiovascular: regular rate and rhythm normal S1-S2  Respiratory: CTAB  Abdomen: Abdomen is distended, tympanic, non tender, diminished BS  Musculoskeletal: no edema  Data Reviewed: Basic Metabolic Panel:  Recent Labs Lab 07/21/15 0256 07/22/15 0307 07/23/15 0456 07/24/15 0522 07/25/15 0633  NA 139 140 143 146* 149*  K 3.6 3.8 3.7 3.6 3.8  CL 113* 114* 115* 114* 114*  CO2 18* 15* 16* 22 25  GLUCOSE 124* 214* 155* 260* 186*  BUN 25* 24* 27* 28* 32*  CREATININE 1.54* 1.45* 1.43* 1.32* 1.47*  CALCIUM 7.8* 7.7* 8.4* 8.6* 8.4*   Liver Function Tests:  Recent Labs Lab 07/19/15 0025 07/19/15 1418 07/20/15 0547  07/22/15 0307 07/24/15 0522  AST 125* 211* 179* 66* 27  ALT 90* 150* 142* 90* 55  ALKPHOS 51 79 60 61 73  BILITOT 1.1 0.8 0.7 0.6 0.9  PROT 5.7* 6.7 5.4* 5.4* 5.8*  ALBUMIN 2.4* 2.6* 2.2* 1.9* 2.0*   No results for input(s): LIPASE, AMYLASE in the last 168 hours. No results for input(s): AMMONIA in the last 168 hours. CBC:  Recent Labs Lab 07/21/15 0256 07/22/15 0307 07/23/15 0456 07/24/15 0522 07/25/15 0633  WBC 18.3* 13.6* 16.3* 19.9* 20.8*  NEUTROABS  --   --   --  17.1*  --   HGB 9.2* 8.8* 9.6* 10.1* 10.1*  HCT 26.3* 25.3* 26.9* 28.2* 29.5*  MCV 83.2 84.1 81.8 80.6 81.7  PLT 255 281 365 463* 562*   Cardiac Enzymes:  Recent Labs Lab 07/18/15 1818 07/19/15 0025 07/19/15 1418 07/19/15 2145 07/20/15 0547  TROPONINI 0.15* 0.30* 1.08* 0.23* 0.32*   BNP (last 3 results) No results for input(s): BNP in the last 8760 hours.  ProBNP (last 3 results) No results for input(s): PROBNP in the last 8760 hours.  CBG:  Recent Labs Lab 07/24/15 2031 07/25/15 0017 07/25/15 0441 07/25/15 0730 07/25/15 1137  GLUCAP 166* 167* 180* 212* 233*    Recent Results (from the past 240 hour(s))  Urine culture     Status: None   Collection Time: 07/18/15 12:03 PM  Result Value Ref Range Status   Specimen Description URINE, CLEAN CATCH  Final   Special Requests NONE  Final   Culture  MULTIPLE SPECIES PRESENT, SUGGEST RECOLLECTION  Final   Report Status 07/19/2015 FINAL  Final  Culture, blood (routine x 2)     Status: None (Preliminary result)   Collection Time: 07/18/15 12:50 PM  Result Value Ref Range Status   Specimen Description BLOOD RIGHT ANTECUBITAL  Final   Special Requests BOTTLES DRAWN AEROBIC AND ANAEROBIC 10CC  Final   Culture  Setup Time   Final    GRAM NEGATIVE COCCOBACILLI ANAEROBIC BOTTLE ONLY CRITICAL RESULT CALLED TO, READ BACK BY AND VERIFIED WITH: A. DAVIS,RN AT 1157 ON [id number] BY S. YARBROUGH    Culture   Final    ANAEROBIC GRAM NEGATIVE  COCCOBACILLI BETA LACTAMASE NEGATIVE    Report Status PENDING  Incomplete  Culture, blood (routine x 2)     Status: None   Collection Time: 07/18/15 12:53 PM  Result Value Ref Range Status   Specimen Description BLOOD LEFT HAND  Final   Special Requests BOTTLES DRAWN AEROBIC ONLY 3CC  Final   Culture NO GROWTH 5 DAYS  Final   Report Status 07/23/2015 FINAL  Final  Culture, blood (routine x 2)     Status: None   Collection Time: 07/19/15  2:10 PM  Result Value Ref Range Status   Specimen Description BLOOD RIGHT ANTECUBITAL  Final   Special Requests BOTTLES DRAWN AEROBIC ONLY  10CC  Final   Culture NO GROWTH 5 DAYS  Final   Report Status 07/24/2015 FINAL  Final  Culture, blood (routine x 2)     Status: None   Collection Time: 07/19/15  2:18 PM  Result Value Ref Range Status   Specimen Description BLOOD RIGHT HAND  Final   Special Requests IN PEDIATRIC BOTTLE  3CC  Final   Culture NO GROWTH 5 DAYS  Final   Report Status 07/24/2015 FINAL  Final  MRSA PCR Screening     Status: None   Collection Time: 07/19/15  4:12 PM  Result Value Ref Range Status   MRSA by PCR NEGATIVE NEGATIVE Final    Comment:        The GeneXpert MRSA Assay (FDA approved for NASAL specimens only), is one component of a comprehensive MRSA colonization surveillance program. It is not intended to diagnose MRSA infection nor to guide or monitor treatment for MRSA infections.   Culture, routine-abscess     Status: None (Preliminary result)   Collection Time: 07/21/15  2:42 PM  Result Value Ref Range Status   Specimen Description ABSCESS LIVER  Final   Special Requests NONE  Final   Gram Stain   Final    ABUNDANT WBC PRESENT, PREDOMINANTLY PMN NO SQUAMOUS EPITHELIAL CELLS SEEN MODERATE GRAM POSITIVE COCCI IN PAIRS Performed at [facility]    Culture   Final    NO GROWTH 2 DAYS Performed at [facility]    Report Status PENDING  Incomplete     Studies: Dg Abd 1 View  07/24/2015   CLINICAL DATA:  Nasogastric tube placement EXAM: ABDOMEN - 1 VIEW COMPARISON:  None. FINDINGS: The tip of the nasogastric tube is in the fundus of the stomach. Abscess strain projects over the right upper quadrant. Visualize lungs are grossly clear. Normal heart size. Gas-filled loops of bowel project over the upper abdomen. IMPRESSION: NG tube placement into the fundus of the stomach by radiological technologist. Electronically Signed   By: Marybelle Killings M.D.   On: 07/24/2015 17:55   Ct Abdomen Pelvis W Contrast  07/24/2015  CLINICAL DATA:  Sepsis, liver abscess, drain placement, increasing white count now 19,000, with plain film evidence of ileus EXAM: CT ABDOMEN AND PELVIS WITH CONTRAST TECHNIQUE: Multidetector CT imaging of the abdomen and pelvis was performed using the standard protocol following bolus administration of intravenous contrast. CONTRAST:  48mL OMNIPAQUE IOHEXOL 300 MG/ML  SOLN COMPARISON:  07/20/2015 FINDINGS: A small left and moderate right pleural effusion is present. Both are increased when compared to the prior study. There is anticipated bilateral dependent consolidation most consistent with atelectasis. An NG tube extends into the stomach. Known liver abscess is again identified as an area of heterogeneous low attenuation with internal enhancing septations and gas. A drainage catheter passes through the abscess via percutaneous placement. Previously cross sectional diameter was 7.1 x 5.0 cm. Essentially unchanged measurements are obtained today. Spleen and pancreas are normal. Adrenal glands show mild fullness, stable. There are bilateral renal cysts. There is a 2 mm nonobstructing stone lower pole left kidney. Bladder is normal. There is fluid into the rectum. There is diverticulosis of the descending and sigmoid colon. There is fluid throughout small bowel. There is mild proximal small bowel dilatation to 3.7 cm, although there is no abrupt cut off or caliber change to suggest  obstruction. Appendix appears normal. There is evidence of moderate anasarca. There is a small volume of ascites in the abdomen and pelvis. There are no acute musculoskeletal findings. IMPRESSION: Findings most consistent with small bowel ileus. Extensive left colon diverticulosis. Increased bilateral pleural effusions. Anasarca with small but increased volume of ascites. Drainage catheter again passes through right hepatic abscess. Abscess is unchanged in size. There is now all multifocal air within the abscess. This could represent necrosis or may be related to the presence of drainage catheter which has been placed in the interval. Electronically Signed   By: Skipper Cliche M.D.   On: 07/24/2015 22:34   Dg Abd Acute W/chest  07/24/2015  CLINICAL DATA:  Abdominal pain and distention, onset tonight EXAM: DG ABDOMEN ACUTE W/ 1V CHEST COMPARISON:  07/21/2015 FINDINGS: There is abnormal dilatation and loop stacking of small bowel without significant mural thickening. No free intraperitoneal air. This may represent adynamic ileus. Obstruction not entirely excluded. The right hepatic lobe abscess drainage catheter appears unchanged in position. There are mild linear opacities in both lung bases which are probably atelectatic. There probably is a small right pleural effusion. IMPRESSION: 1. Abnormal dilatation of small bowel. Adynamic ileus is more likely than mechanical obstruction. 2. Unchanged position of the liver abscess drainage catheter. 3. Atelectatic appearing basilar lung opacities bilaterally. Probable small right pleural effusion. Electronically Signed   By: Andreas Newport M.D.   On: 07/24/2015 07:04   Dg Addison Bailey G Tube Plc W/fl-no Rad  07/24/2015  CLINICAL DATA:  NASO G TUBE PLACEMENT WITH FLUORO Fluoroscopy was utilized by the requesting physician.  No radiographic interpretation.    Scheduled Meds: . aspirin  81 mg Oral Daily  . enoxaparin (LOVENOX) injection  40 mg Subcutaneous Q24H  .  finasteride  5 mg Oral Daily  . insulin aspart  0-15 Units Subcutaneous 6 times per day  . [START ON 07/26/2015] insulin detemir  6 Units Subcutaneous Daily  . levalbuterol  0.63 mg Nebulization TID  . piperacillin-tazobactam (ZOSYN)  IV  3.375 g Intravenous Q8H  . sodium chloride  3 mL Intravenous Q12H  . tamsulosin  0.4 mg Oral Daily   Continuous Infusions: . sodium chloride 1,000 mL (07/25/15 0010)    Principal Problem:   Sepsis (  [place]) Active Problems:   Hypertension goal BP (blood pressure) < 140/90   Hyperlipidemia LDL goal <100   Chronic kidney disease (CKD), stage III (moderate)   Anemia of chronic renal failure   Acute renal failure (ARF) (HCC)   Transaminitis   Diabetes mellitus, insulin dependent (IDDM), uncontrolled (HCC)   Leukocytosis   Acute kidney injury ([place])   SOB (shortness of breath)   Liver abscess   Fever and chills   Gram-negative bacteremia ([place])    Time spent: 35 min    [facility] Pager [phone] If 7PM-7AM, please contact night-coverage at [email or web address], password [facility] 07/25/2015, 12:58 PM  LOS: 7 days

## 2015-07-25 NOTE — Telephone Encounter (Signed)
FYI: patient had appointment for January 2017 and wife Olegario Shearer) cancelled the appointment. She wanted me to inform you that he is currently at [facility]. He was admitted last Wednesday for cyst on the liver. He is on the 6th floor. I asked her was she sure that she wanted to cancel the appointment for January and she was like yes

## 2015-07-25 NOTE — Progress Notes (Signed)
Physical Therapy Treatment Patient Details Name: Brian [name] MRN: [id number] DOB: 01-Aug-1934 Today's Date: 07/25/2015    History of Present Illness Admitted with weakness, fever tachycardia, sepsis likely secondary to lover abscess, drain placed by interventional Radiology on 11/26; since drain placement overall status improving; PMH significant for DM    PT Comments    Pt was able to assist with gait but struggles to control walker.  Will be going home with wife who can assist him due to being in good health.  Will anticipate PT continuing to see as he is not fully safe on walker and home is plan.  Also needs to increase his time OOB and hopefully NG tube is resolved and discontinued soon.  Follow Up Recommendations  Home health PT;Supervision/Assistance - 24 hour     Equipment Recommendations  Rolling walker with 5" wheels;3in1 (PT)    Recommendations for Other Services OT consult     Precautions / Restrictions Precautions Precautions: Fall Restrictions Weight Bearing Restrictions: No    Mobility  Bed Mobility Overal bed mobility: Needs Assistance Bed Mobility: Supine to Sit     Supine to sit: Supervision;HOB elevated     General bed mobility comments: used rails and HOB elevated  Transfers Overall transfer level: Needs assistance Equipment used: Rolling walker (2 wheeled);1 person hand held assist Transfers: Sit to/from [facility] Sit to Stand: Min assist Stand pivot transfers: Min guard       General transfer comment: reminders for hand placement with some repetition  Ambulation/Gait Ambulation/Gait assistance: Min guard;Min assist Ambulation Distance (Feet): 200 Feet Assistive device: Rolling walker (2 wheeled);2 person hand held assist Gait Pattern/deviations: Step-through pattern;Shuffle;Wide base of support;Drifts right/left Gait velocity: reduced, has son along with PT Gait velocity interpretation: Below normal speed for  age/gender General Gait Details: assisted to keep walker away from obstacles in the hall   Stairs            Wheelchair Mobility    Modified Rankin (Stroke Patients Only)       Balance Overall balance assessment: Needs assistance Sitting-balance support: Feet supported Sitting balance-Leahy Scale: Good       Standing balance-Leahy Scale: Fair                      Cognition Arousal/Alertness: Awake/alert Behavior During Therapy: WFL for tasks assessed/performed Overall Cognitive Status: Within Functional Limits for tasks assessed                      Exercises      General Comments General comments (skin integrity, edema, etc.): Pt has JP drain on R side with IV on RUE as well.  Handles his mobility with reminders for obstacles and how to place hands to sit but not retaining this between reminders      Pertinent Vitals/Pain Pain Assessment: 0-10 Pain Score: 5  Pain Location: abdomen incision Pain Descriptors / Indicators: Operative site guarding Pain Intervention(s): Monitored during session;Premedicated before session;Repositioned    Home Living                      Prior Function            PT Goals (current goals can now be found in the care plan section) Acute Rehab PT Goals Patient Stated Goal: get something to eat and feel better Progress towards PT goals: Progressing toward goals    Frequency  Min 3X/week    PT Plan  Current plan remains appropriate    Co-evaluation             End of Session Equipment Utilized During Treatment: Gait belt Activity Tolerance: Patient limited by fatigue;Patient limited by pain Patient left: in chair;with call bell/phone within reach;with family/visitor present;with nursing/sitter in room     Time: 1002-1032 PT Time Calculation (min) (ACUTE ONLY): 30 min  Charges:  $Gait Training: 8-22 mins $Therapeutic Activity: 8-22 mins                    G Codes:      Ramond Dial 2015/08/10, 11:45 AM   Mee Hives, PT MS Acute Rehab Dept. Number: [phone] [id number] and [place] [phone]

## 2015-07-25 NOTE — Progress Notes (Signed)
Subjective: Pt is s/p perc drain of hepatic abscess on 11/26 Still has ileus/NGT to LIWS Had repeat CT yesterday.  Allergies: Niaspan; Shellfish allergy; Statins; and Zetia  Medications:  Current facility-administered medications:  .  0.9 %  sodium chloride infusion, , Intravenous, Continuous, Kelvin Cellar, MD, Last Rate: 75 mL/hr at 07/25/15 0010, 1,000 mL at 07/25/15 0010 .  acetaminophen (TYLENOL) tablet 650 mg, 650 mg, Oral, Q4H PRN, Samella Parr, NP, 650 mg at 07/20/15 2335 .  alum & mag hydroxide-simeth (MAALOX/MYLANTA) 200-200-20 MG/5ML suspension 30 mL, 30 mL, Oral, Q6H PRN, Samella Parr, NP .  aspirin chewable tablet 81 mg, 81 mg, Oral, Daily, Kelvin Cellar, MD, 81 mg at 07/25/15 1058 .  enoxaparin (LOVENOX) injection 40 mg, 40 mg, Subcutaneous, Q24H, Kelvin Cellar, MD, 40 mg at 07/24/15 2304 .  finasteride (PROSCAR) tablet 5 mg, 5 mg, Oral, Daily, Samella Parr, NP, 5 mg at 07/25/15 1058 .  insulin aspart (novoLOG) injection 0-15 Units, 0-15 Units, Subcutaneous, 6 times per day, Kelvin Cellar, MD, 5 Units at 07/25/15 [phone] .  [START ON 07/26/2015] insulin detemir (LEVEMIR) injection 6 Units, 6 Units, Subcutaneous, Daily, Domenic Polite, MD .  levalbuterol [facility]) nebulizer solution 0.63 mg, 0.63 mg, Nebulization, TID, Kelvin Cellar, MD, 0.63 mg at 07/25/15 0735 .  levalbuterol (XOPENEX) nebulizer solution 0.63 mg, 0.63 mg, Nebulization, Q4H PRN, Kelvin Cellar, MD, 0.63 mg at 07/23/15 0649 .  morphine 2 MG/ML injection 1 mg, 1 mg, Intravenous, Q3H PRN, Kelvin Cellar, MD, 1 mg at 07/24/15 2300 .  ondansetron (ZOFRAN) tablet 4 mg, 4 mg, Oral, Q6H PRN **OR** ondansetron (ZOFRAN) injection 4 mg, 4 mg, Intravenous, Q6H PRN, Samella Parr, NP, 4 mg at 07/25/15 [id number] .  oxyCODONE (Oxy IR/ROXICODONE) immediate release tablet 5 mg, 5 mg, Oral, Q6H PRN, Kelvin Cellar, MD, 5 mg at 07/21/15 1725 .  piperacillin-tazobactam (ZOSYN) IVPB 3.375 g, 3.375 g,  Intravenous, Q8H, Kelvin Cellar, MD, 3.375 g at 07/25/15 0640 .  sodium chloride 0.9 % injection 3 mL, 3 mL, Intravenous, Q12H, Samella Parr, NP, 3 mL at 07/25/15 0010 .  tamsulosin (FLOMAX) capsule 0.4 mg, 0.4 mg, Oral, Daily, Samella Parr, NP, 0.4 mg at 07/23/15 1150   Vital Signs: BP 150/89 mmHg  Pulse 105  Temp(Src) 98.6 F (37 C) (Oral)  Resp 18  Ht 5\' 11"  (1.803 m)  Wt 174 lb 2.6 oz (79 kg)  BMI 24.30 kg/m2  SpO2 99%  Physical Exam  Constitutional: He is oriented to person, place, and time. He appears well-developed. No distress.  Cardiovascular: Normal rate, regular rhythm and normal heart sounds.   Pulmonary/Chest: Effort normal and breath sounds normal. No respiratory distress.  Abdominal: Soft. He exhibits distension. There is no tenderness.  RUQ drain intact, site clean Serosanguinous output  Neurological: He is alert and oriented to person, place, and time.  Skin: He is not diaphoretic.     Ct Abdomen Pelvis W Contrast  07/24/2015  CLINICAL DATA:  Sepsis, liver abscess, drain placement, increasing white count now 19,000, with plain film evidence of ileus EXAM: CT ABDOMEN AND PELVIS WITH CONTRAST TECHNIQUE: Multidetector CT imaging of the abdomen and pelvis was performed using the standard protocol following bolus administration of intravenous contrast. CONTRAST:  69mL OMNIPAQUE IOHEXOL 300 MG/ML  SOLN COMPARISON:  07/20/2015 FINDINGS: A small left and moderate right pleural effusion is present. Both are increased when compared to the prior study. There is anticipated bilateral dependent consolidation most consistent  with atelectasis. An NG tube extends into the stomach. Known liver abscess is again identified as an area of heterogeneous low attenuation with internal enhancing septations and gas. A drainage catheter passes through the abscess via percutaneous placement. Previously cross sectional diameter was 7.1 x 5.0 cm. Essentially unchanged measurements are  obtained today. Spleen and pancreas are normal. Adrenal glands show mild fullness, stable. There are bilateral renal cysts. There is a 2 mm nonobstructing stone lower pole left kidney. Bladder is normal. There is fluid into the rectum. There is diverticulosis of the descending and sigmoid colon. There is fluid throughout small bowel. There is mild proximal small bowel dilatation to 3.7 cm, although there is no abrupt cut off or caliber change to suggest obstruction. Appendix appears normal. There is evidence of moderate anasarca. There is a small volume of ascites in the abdomen and pelvis. There are no acute musculoskeletal findings. IMPRESSION: Findings most consistent with small bowel ileus. Extensive left colon diverticulosis. Increased bilateral pleural effusions. Anasarca with small but increased volume of ascites. Drainage catheter again passes through right hepatic abscess. Abscess is unchanged in size. There is now all multifocal air within the abscess. This could represent necrosis or may be related to the presence of drainage catheter which has been placed in the interval. Electronically Signed   By: Skipper Cliche M.D.   On: 07/24/2015 22:34     Labs:  CBC:  Recent Labs  07/22/15 0307 07/23/15 0456 07/24/15 0522 07/25/15 0633  WBC 13.6* 16.3* 19.9* 20.8*  HGB 8.8* 9.6* 10.1* 10.1*  HCT 25.3* 26.9* 28.2* 29.5*  PLT 281 365 463* 562*    COAGS:  Recent Labs  07/19/15 1625 07/21/15 0256  INR 1.51* 1.42  APTT 40* 41*    BMP:  Recent Labs  07/22/15 0307 07/23/15 0456 07/24/15 0522 07/25/15 0633  NA 140 143 146* 149*  K 3.8 3.7 3.6 3.8  CL 114* 115* 114* 114*  CO2 15* 16* 22 25  GLUCOSE 214* 155* 260* 186*  BUN 24* 27* 28* 32*  CALCIUM 7.7* 8.4* 8.6* 8.4*  CREATININE 1.45* 1.43* 1.32* 1.47*  GFRNONAA 44* 45* 50* 44*  GFRAA 51* 52* 58* 51*    LIVER FUNCTION TESTS:  Recent Labs  07/19/15 1418 07/20/15 0547 07/22/15 0307 07/24/15 0522  BILITOT 0.8 0.7 0.6  0.9  AST 211* 179* 66* 27  ALT 150* 142* 90* 55  ALKPHOS 79 60 61 73  PROT 6.7 5.4* 5.4* 5.8*  ALBUMIN 2.6* 2.2* 1.9* 2.0*    Assessment and Plan: Sepsis Hepatic abscess s/p perc drain 11/26 WBC continues to trend up, Provatella on blood Cx, GPC on abscess gram stain CT reviewed. Would not expect much of any interval reduction in size of hepatic abscess after just 4 days. Presence of gas is likely due to drain flushes. IR following.  SignedAscencion Dike 07/25/2015, 1:37 PM   I spent a total of 15 Minutes at the the patient's bedside AND on the patient's hospital floor or unit, greater than 50% of which was counseling/coordinating care for hepatic abscess.

## 2015-07-26 ENCOUNTER — Inpatient Hospital Stay (HOSPITAL_COMMUNITY): Payer: Medicare Other

## 2015-07-26 LAB — BASIC METABOLIC PANEL
ANION GAP: 10 (ref 5–15)
BUN: 32 mg/dL — ABNORMAL HIGH (ref 6–20)
CHLORIDE: 118 mmol/L — AB (ref 101–111)
CO2: 23 mmol/L (ref 22–32)
CREATININE: 1.3 mg/dL — AB (ref 0.61–1.24)
Calcium: 8 mg/dL — ABNORMAL LOW (ref 8.9–10.3)
GFR calc Af Amer: 59 mL/min — ABNORMAL LOW (ref >60)
GFR calc non Af Amer: 51 mL/min — ABNORMAL LOW (ref >60)
Glucose, Bld: 131 mg/dL — ABNORMAL HIGH (ref 65–99)
Potassium: 3.8 mmol/L (ref 3.5–5.1)
Sodium: 151 mmol/L — ABNORMAL HIGH (ref 135–145)

## 2015-07-26 LAB — CULTURE, ROUTINE-ABSCESS: CULTURE: NO GROWTH

## 2015-07-26 LAB — GLUCOSE, CAPILLARY
GLUCOSE-CAPILLARY: 151 mg/dL — AB (ref 65–99)
GLUCOSE-CAPILLARY: 99 mg/dL (ref 65–99)
Glucose-Capillary: 111 mg/dL — ABNORMAL HIGH (ref 65–99)
Glucose-Capillary: 157 mg/dL — ABNORMAL HIGH (ref 65–99)
Glucose-Capillary: 103 mg/dL — ABNORMAL HIGH (ref 65–99)
Glucose-Capillary: 90 mg/dL (ref 65–99)

## 2015-07-26 LAB — CBC
HCT: 26.6 % — ABNORMAL LOW (ref 39.0–52.0)
HEMOGLOBIN: 9 g/dL — AB (ref 13.0–17.0)
MCH: 28.4 pg (ref 26.0–34.0)
MCHC: 33.8 g/dL (ref 30.0–36.0)
MCV: 83.9 fL (ref 78.0–100.0)
PLATELETS: 502 10*3/uL — AB (ref 150–400)
RBC: 3.17 MIL/uL — AB (ref 4.22–5.81)
RDW: 14.5 % (ref 11.5–15.5)
WBC: 20.7 10*3/uL — AB (ref 4.0–10.5)

## 2015-07-26 MED ORDER — LEVALBUTEROL HCL 0.63 MG/3ML IN NEBU
0.6300 mg | INHALATION_SOLUTION | Freq: Four times a day (QID) | RESPIRATORY_TRACT | Status: DC | PRN
Start: 2015-07-26 — End: 2015-08-04

## 2015-07-26 MED ORDER — SODIUM CHLORIDE 0.45 % IV SOLN
INTRAVENOUS | Status: DC
  Administered 2015-07-26 – 2015-07-27 (×2): via INTRAVENOUS

## 2015-07-26 NOTE — Progress Notes (Signed)
[facility] PROGRESS NOTE  Brian [name] [id number] DOB: April 04, 1934 DOA: 07/18/2015 PCP: [name], MD  Interim summary Brian [name] is a 81 year old gentleman with a past medical history of diabetes mellitus, admitted to the medicine service on 07/18/2015 when he presented with complaints of generalized weakness, dizziness, chills that was social with hyperglycemia. He reports having several episodes of nausea and vomiting earlier this week after having oysters. Initial lab work revealed acute on chronic kidney injury having creatinine of 2.85 with BUN of 36. He was found to be febrile having a temperature of 103 and started on broad-spectrum IV antimicrobial therapy with vancomycin and Zosyn. Patient also remained hypotensive overnight with systolic blood pressures in the 80s to 90s.workup revealed an abdominal ultrasoundwhich did not reveal acute intra-abdominal pathology. Lab work did reveal elevated transaminases with AST of 125 and ALT of 90.other workup included a chest x-ray and urinalysis which was unremarkable. Patient was transferred to the stepdown unit on 07/19/2015 for closer monitoring. CT scan of abdomen and pelvis performed on 07/20/2015 showed a hypoattenuating lesion within the liver. This was further worked up with an MRI of liver that showed findings compatible with abscess collection. Interventional radiology was consulted for drain placement that was performed on 07/21/2015. Blood cultures obtained on admission growing gram-negative bacilli, with repeat blood cultures show no growth to date. Cultures from abscess growing gram-positive cocci.  Awaiting organism identification and susceptibility testing.  Assessment/Plan: 1. Severe sepsis -improving -He had reported eating oysters past week after which he developed multiple episodes of nausea and vomiting that was associated with diarrhea. Vibrio parahaemolyticus was considered -On 07/20/2015: CT scan of abdomen  and pelvis performed revealing 7.1 x 5.0 x 5.7 cm hypoattenuating lesion within the liver. Radiology reporting that this could represent an abscess or perhaps primary hepatic malignancy. This was further worked up with MRI of liver that demonstrated findings compatible with abscess collection.    -On 07/21/2015 blood cultures drawn from 07/18/2015 growing 1/2 gram-negative rods, identification and susceptibility testing are still in progress. Ceftriaxone changed to Fortaz 1 g IV every 12 hours and Vancomycin discontinued. Interventional radiology placed drain catheter of liver abscess on this date. Gram stain showing gram positive cocci in pairs from abscess.  -Dr. Baxter Flattery of infectious disease, consulting, suspects that a possible source may have been related to translocation of bacteria in setting of gastroenteritis. -CT 11/29 without change in size of abscess, but too early to know -i think he will need a colonoscopy down the road, wife reports last one >5-10years ago -reportedly normal   2.  Ileus -Overnight 11/29, patient developed multiple episodes of nausea and vomiting and noted to have distended abdomen,  abdominal x-ray that showed adynamic ileus. -continue NG decompression, NPO, IVF, KUb in am, had BM last night, but still distended and KUB without much change -will continue current mgt, KUB  In am -Underlying infectious process could be the culprit.   3.  Acute on chronic renal failure -Likely secondary to ATN in setting of hypotension or perhaps sepsis/critical illness -baseline between 1.4 1.7,on admit creatinine of 2.85 with BUN of 36 -improving with NS, back to baseline  4.  Elevated transaminases -due to abscesses most likely -S/P drainage of liver abscess on 07/21/2015.  -Labs showing improvement to elevated liver enzymes   5.  Elevated troponin. -Lab work showing upward trend in his troponin to 0.30, no chest pain. -related to demand ischemia in setting of critical illness,  Troponins trending down.  6.  Diabetes mellitus -cut down lantus, due to NPO, ileus  6.  History of hypertension. -He presented with hypotension for which antihypertensive medications were discontinued   DVT prophylaxis. Lovenox  Code Status: full code Family Communication: wife at bedside Disposition Plan: pending improvement in ileus and abscesses   Antibiotics:  Vancomycin started on 07/18/2015 stopped on 07/20/2015  Zosyn discontinued on 07/19/2015  Ceftriaxone started on 07/19/2015 stopped on 07/20/2015   Doxycycline started on 07/19/2015   [name] started on 07/20/2015  HPI/Subjective: Had a BM last night, feels ok, no vomiting  Objective: Filed Vitals:   07/26/15 0432 07/26/15 0900  BP: 145/82 136/80  Pulse: 101 98  Temp: 98.4 F (36.9 C) 93.4 F (34.1 C)  Resp: 17 18    Intake/Output Summary (Last 24 hours) at 07/26/15 1410 Last data filed at 07/26/15 1034  Gross per 24 hour  Intake 1983.75 ml  Output    327 ml  Net 1656.75 ml   Filed Weights   07/22/15 2028 07/24/15 2034 07/25/15 2009  Weight: 79 kg (174 lb 2.6 oz) 79 kg (174 lb 2.6 oz) 79.2 kg (174 lb 9.7 oz)    Exam:   General:  ill-appearing, AAOx3, no distress.   Cardiovascular: regular rate and rhythm normal S1-S2  Respiratory: CTAB  Abdomen: Abdomen is distended, tympanic, non tender, diminished BS  Musculoskeletal: no edema  Data Reviewed: Basic Metabolic Panel:  Recent Labs Lab 07/22/15 0307 07/23/15 0456 07/24/15 0522 07/25/15 0633 07/26/15 0602  NA 140 143 146* 149* 151*  K 3.8 3.7 3.6 3.8 3.8  CL 114* 115* 114* 114* 118*  CO2 15* 16* 22 25 23   GLUCOSE 214* 155* 260* 186* 131*  BUN 24* 27* 28* 32* 32*  CREATININE 1.45* 1.43* 1.32* 1.47* 1.30*  CALCIUM 7.7* 8.4* 8.6* 8.4* 8.0*   Liver Function Tests:  Recent Labs Lab 07/19/15 1418 07/20/15 0547 07/22/15 0307 07/24/15 0522  AST 211* 179* 66* 27  ALT 150* 142* 90* 55  ALKPHOS 79 60 61 73  BILITOT 0.8 0.7 0.6  0.9  PROT 6.7 5.4* 5.4* 5.8*  ALBUMIN 2.6* 2.2* 1.9* 2.0*   No results for input(s): LIPASE, AMYLASE in the last 168 hours. No results for input(s): AMMONIA in the last 168 hours. CBC:  Recent Labs Lab 07/22/15 0307 07/23/15 0456 07/24/15 0522 07/25/15 0633 07/26/15 0602  WBC 13.6* 16.3* 19.9* 20.8* 20.7*  NEUTROABS  --   --  17.1*  --   --   HGB 8.8* 9.6* 10.1* 10.1* 9.0*  HCT 25.3* 26.9* 28.2* 29.5* 26.6*  MCV 84.1 81.8 80.6 81.7 83.9  PLT 281 365 463* 562* 502*   Cardiac Enzymes:  Recent Labs Lab 07/19/15 1418 07/19/15 2145 07/20/15 0547  TROPONINI 1.08* 0.23* 0.32*   BNP (last 3 results) No results for input(s): BNP in the last 8760 hours.  ProBNP (last 3 results) No results for input(s): PROBNP in the last 8760 hours.  CBG:  Recent Labs Lab 07/25/15 2008 07/26/15 0012 07/26/15 0429 07/26/15 0807 07/26/15 1145  GLUCAP 137* 99 111* 151* 157*    Recent Results (from the past 240 hour(s))  Urine culture     Status: None   Collection Time: 07/18/15 12:03 PM  Result Value Ref Range Status   Specimen Description URINE, CLEAN CATCH  Final   Special Requests NONE  Final   Culture MULTIPLE SPECIES PRESENT, SUGGEST RECOLLECTION  Final   Report Status 07/19/2015 FINAL  Final  Culture, blood (routine x 2)  Status: None   Collection Time: 07/18/15 12:50 PM  Result Value Ref Range Status   Specimen Description BLOOD RIGHT ANTECUBITAL  Final   Special Requests BOTTLES DRAWN AEROBIC AND ANAEROBIC 10CC  Final   Culture  Setup Time   Final    GRAM NEGATIVE COCCOBACILLI ANAEROBIC BOTTLE ONLY CRITICAL RESULT CALLED TO, READ BACK BY AND VERIFIED WITH: A. DAVIS,RN AT 1157 ON [id number] BY S. YARBROUGH    Culture PREVOTELLA SPECIES BETA LACTAMASE NEGATIVE   Final   Report Status 07/25/2015 FINAL  Final  Culture, blood (routine x 2)     Status: None   Collection Time: 07/18/15 12:53 PM  Result Value Ref Range Status   Specimen Description BLOOD LEFT HAND  Final    Special Requests BOTTLES DRAWN AEROBIC ONLY 3CC  Final   Culture NO GROWTH 5 DAYS  Final   Report Status 07/23/2015 FINAL  Final  Culture, blood (routine x 2)     Status: None   Collection Time: 07/19/15  2:10 PM  Result Value Ref Range Status   Specimen Description BLOOD RIGHT ANTECUBITAL  Final   Special Requests BOTTLES DRAWN AEROBIC ONLY  10CC  Final   Culture NO GROWTH 5 DAYS  Final   Report Status 07/24/2015 FINAL  Final  Culture, blood (routine x 2)     Status: None   Collection Time: 07/19/15  2:18 PM  Result Value Ref Range Status   Specimen Description BLOOD RIGHT HAND  Final   Special Requests IN PEDIATRIC BOTTLE  3CC  Final   Culture NO GROWTH 5 DAYS  Final   Report Status 07/24/2015 FINAL  Final  MRSA PCR Screening     Status: None   Collection Time: 07/19/15  4:12 PM  Result Value Ref Range Status   MRSA by PCR NEGATIVE NEGATIVE Final    Comment:        The GeneXpert MRSA Assay (FDA approved for NASAL specimens only), is one component of a comprehensive MRSA colonization surveillance program. It is not intended to diagnose MRSA infection nor to guide or monitor treatment for MRSA infections.   Culture, routine-abscess     Status: None   Collection Time: 07/21/15  2:42 PM  Result Value Ref Range Status   Specimen Description ABSCESS LIVER  Final   Special Requests NONE  Final   Gram Stain   Final    ABUNDANT WBC PRESENT, PREDOMINANTLY PMN NO SQUAMOUS EPITHELIAL CELLS SEEN MODERATE GRAM POSITIVE COCCI IN PAIRS Performed at [facility]    Culture   Final    NO GROWTH 2 DAYS Performed at [facility]    Report Status 07/26/2015 FINAL  Final     Studies: Dg Abd 1 View  07/26/2015  CLINICAL DATA:  Ileus.  Abdominal discomfort. EXAM: ABDOMEN - 1 VIEW COMPARISON:  07/24/2015 FINDINGS: Distended small bowel loops continued of project over the abdomen. Abscess strain projects over the right upper quadrant. There is no obvious free  intraperitoneal gas. There is contrast and gas in the colon. IMPRESSION: Stable ileus. Electronically Signed   By: Marybelle Killings M.D.   On: 07/26/2015 08:07   Dg Abd 1 View  07/24/2015  CLINICAL DATA:  Nasogastric tube placement EXAM: ABDOMEN - 1 VIEW COMPARISON:  None. FINDINGS: The tip of the nasogastric tube is in the fundus of the stomach. Abscess strain projects over the right upper quadrant. Visualize lungs are grossly clear. Normal heart size. Gas-filled loops of bowel project over the  upper abdomen. IMPRESSION: NG tube placement into the fundus of the stomach by radiological technologist. Electronically Signed   By: Marybelle Killings M.D.   On: 07/24/2015 17:55   Ct Abdomen Pelvis W Contrast  07/24/2015  CLINICAL DATA:  Sepsis, liver abscess, drain placement, increasing white count now 19,000, with plain film evidence of ileus EXAM: CT ABDOMEN AND PELVIS WITH CONTRAST TECHNIQUE: Multidetector CT imaging of the abdomen and pelvis was performed using the standard protocol following bolus administration of intravenous contrast. CONTRAST:  13mL OMNIPAQUE IOHEXOL 300 MG/ML  SOLN COMPARISON:  07/20/2015 FINDINGS: A small left and moderate right pleural effusion is present. Both are increased when compared to the prior study. There is anticipated bilateral dependent consolidation most consistent with atelectasis. An NG tube extends into the stomach. Known liver abscess is again identified as an area of heterogeneous low attenuation with internal enhancing septations and gas. A drainage catheter passes through the abscess via percutaneous placement. Previously cross sectional diameter was 7.1 x 5.0 cm. Essentially unchanged measurements are obtained today. Spleen and pancreas are normal. Adrenal glands show mild fullness, stable. There are bilateral renal cysts. There is a 2 mm nonobstructing stone lower pole left kidney. Bladder is normal. There is fluid into the rectum. There is diverticulosis of the descending  and sigmoid colon. There is fluid throughout small bowel. There is mild proximal small bowel dilatation to 3.7 cm, although there is no abrupt cut off or caliber change to suggest obstruction. Appendix appears normal. There is evidence of moderate anasarca. There is a small volume of ascites in the abdomen and pelvis. There are no acute musculoskeletal findings. IMPRESSION: Findings most consistent with small bowel ileus. Extensive left colon diverticulosis. Increased bilateral pleural effusions. Anasarca with small but increased volume of ascites. Drainage catheter again passes through right hepatic abscess. Abscess is unchanged in size. There is now all multifocal air within the abscess. This could represent necrosis or may be related to the presence of drainage catheter which has been placed in the interval. Electronically Signed   By: Skipper Cliche M.D.   On: 07/24/2015 22:34   Dg Addison Bailey G Tube Plc W/fl-no Rad  07/24/2015  CLINICAL DATA:  NASO G TUBE PLACEMENT WITH FLUORO Fluoroscopy was utilized by the requesting physician.  No radiographic interpretation.    Scheduled Meds: . aspirin  81 mg Oral Daily  . enoxaparin (LOVENOX) injection  40 mg Subcutaneous Q24H  . finasteride  5 mg Oral Daily  . insulin aspart  0-15 Units Subcutaneous 6 times per day  . insulin detemir  6 Units Subcutaneous Daily  . piperacillin-tazobactam (ZOSYN)  IV  3.375 g Intravenous Q8H  . sodium chloride  3 mL Intravenous Q12H  . tamsulosin  0.4 mg Oral Daily   Continuous Infusions: . sodium chloride      Principal Problem:   Sepsis ([employer]) Active Problems:   Hypertension goal BP (blood pressure) < 140/90   Hyperlipidemia LDL goal <100   Chronic kidney disease (CKD), stage III (moderate)   Anemia of chronic renal failure   Acute renal failure (ARF) (HCC)   Transaminitis   Diabetes mellitus, insulin dependent (IDDM), uncontrolled (HCC)   Leukocytosis   Acute kidney injury ([place])   SOB (shortness of breath)    Liver abscess   Fever and chills   Gram-negative bacteremia ([place])    Time spent: 35 min    [facility] Pager [phone] If 7PM-7AM, please contact night-coverage at [email or web address], password [facility] 07/26/2015, 2:10  PM  LOS: 8 days

## 2015-07-26 NOTE — Progress Notes (Signed)
INFECTIOUS DISEASE PROGRESS NOTE  ID: Brian [name] is a 81 y.o. male with  Principal Problem:   Sepsis ([place]) Active Problems:   Hypertension goal BP (blood pressure) < 140/90   Hyperlipidemia LDL goal <100   Chronic kidney disease (CKD), stage III (moderate)   Anemia of chronic renal failure   Acute renal failure (ARF) (HCC)   Transaminitis   Diabetes mellitus, insulin dependent (IDDM), uncontrolled (HCC)   Leukocytosis   Acute kidney injury ([place])   SOB (shortness of breath)   Liver abscess   Fever and chills   Gram-negative bacteremia (HCC)  Subjective: C/o hunger  Abtx:  Anti-infectives    Start     Dose/Rate Route Frequency Ordered Stop   07/25/15 0000  piperacillin-tazobactam (ZOSYN) IVPB 3.375 g  Status:  Discontinued     3.375 g 12.5 mL/hr over 240 Minutes Intravenous 3 times per day 07/24/15 1705 07/24/15 1723   07/25/15 0000  piperacillin-tazobactam (ZOSYN) IVPB 3.375 g     3.375 g 12.5 mL/hr over 240 Minutes Intravenous Every 8 hours 07/24/15 1723     07/24/15 1715  piperacillin-tazobactam (ZOSYN) IVPB 3.375 g     3.375 g 100 mL/hr over 30 Minutes Intravenous  Once 07/24/15 1704 07/24/15 1838   07/21/15 1430  cefTRIAXone (ROCEPHIN) 2 g in dextrose 5 % 50 mL IVPB  Status:  Discontinued     2 g 100 mL/hr over 30 Minutes Intravenous Every 24 hours 07/20/15 1849 07/20/15 1850   07/21/15 1000  cefTRIAXone (ROCEPHIN) 2 g in dextrose 5 % 50 mL IVPB  Status:  Discontinued     2 g 100 mL/hr over 30 Minutes Intravenous Every 24 hours 07/20/15 1850 07/20/15 1855   07/20/15 1915  cefTAZidime (FORTAZ) 1 g in dextrose 5 % 50 mL IVPB  Status:  Discontinued     1 g 100 mL/hr over 30 Minutes Intravenous Every 12 hours 07/20/15 1909 07/24/15 1700   07/20/15 1645  metroNIDAZOLE (FLAGYL) IVPB 500 mg  Status:  Discontinued     500 mg 100 mL/hr over 60 Minutes Intravenous Every 8 hours 07/20/15 1630 07/24/15 1700   07/19/15 1430  doxycycline (VIBRAMYCIN) 100 mg in dextrose  5 % 250 mL IVPB  Status:  Discontinued     100 mg 125 mL/hr over 120 Minutes Intravenous Every 12 hours 07/19/15 1417 07/22/15 1034   07/19/15 1430  cefTRIAXone (ROCEPHIN) 1 g in dextrose 5 % 50 mL IVPB  Status:  Discontinued     1 g 100 mL/hr over 30 Minutes Intravenous Every 24 hours 07/19/15 1417 07/20/15 1849   07/18/15 2200  piperacillin-tazobactam (ZOSYN) IVPB 3.375 g  Status:  Discontinued     3.375 g 12.5 mL/hr over 240 Minutes Intravenous 3 times per day 07/18/15 1850 07/19/15 1415   07/18/15 1900  vancomycin (VANCOCIN) IVPB 750 mg/150 ml premix  Status:  Discontinued     750 mg 150 mL/hr over 60 Minutes Intravenous Every 24 hours 07/18/15 1850 07/20/15 1630   07/18/15 1300  cefTRIAXone (ROCEPHIN) 1 g in dextrose 5 % 50 mL IVPB  Status:  Discontinued     1 g 100 mL/hr over 30 Minutes Intravenous Every 24 hours 07/18/15 1241 07/18/15 1834      Medications:  Scheduled: . aspirin  81 mg Oral Daily  . enoxaparin (LOVENOX) injection  40 mg Subcutaneous Q24H  . finasteride  5 mg Oral Daily  . insulin aspart  0-15 Units Subcutaneous 6 times per day  .  insulin detemir  6 Units Subcutaneous Daily  . levalbuterol  0.63 mg Nebulization TID  . piperacillin-tazobactam (ZOSYN)  IV  3.375 g Intravenous Q8H  . sodium chloride  3 mL Intravenous Q12H  . tamsulosin  0.4 mg Oral Daily    Objective: Vital signs in last 24 hours: Temp:  [93.4 F (34.1 C)-98.4 F (36.9 C)] 93.4 F (34.1 C) (12/01 0900) Pulse Rate:  [98-109] 98 (12/01 0900) Resp:  [17-18] 18 (12/01 0900) BP: (96-145)/(69-82) 136/80 mmHg (12/01 0900) SpO2:  [98 %-100 %] 100 % (12/01 0900) Weight:  [79.2 kg (174 lb 9.7 oz)] 79.2 kg (174 lb 9.7 oz) (11/30 2009)   General appearance: alert and fatigued Resp: clear to auscultation bilaterally Cardio: regular rate and rhythm GI: abnormal findings:  distended, hyperactive bowel sounds and non-tender Extremities: edema 3+ BLE  Lab Results  Recent Labs  07/25/15 0633  07/26/15 0602  WBC 20.8* 20.7*  HGB 10.1* 9.0*  HCT 29.5* 26.6*  NA 149* 151*  K 3.8 3.8  CL 114* 118*  CO2 25 23  BUN 32* 32*  CREATININE 1.47* 1.30*   Liver Panel  Recent Labs  07/24/15 0522  PROT 5.8*  ALBUMIN 2.0*  AST 27  ALT 55  ALKPHOS 73  BILITOT 0.9   Sedimentation Rate No results for input(s): ESRSEDRATE in the last 72 hours. C-Reactive Protein No results for input(s): CRP in the last 72 hours.  Microbiology: Recent Results (from the past 240 hour(s))  Urine culture     Status: None   Collection Time: 07/18/15 12:03 PM  Result Value Ref Range Status   Specimen Description URINE, CLEAN CATCH  Final   Special Requests NONE  Final   Culture MULTIPLE SPECIES PRESENT, SUGGEST RECOLLECTION  Final   Report Status 07/19/2015 FINAL  Final  Culture, blood (routine x 2)     Status: None   Collection Time: 07/18/15 12:50 PM  Result Value Ref Range Status   Specimen Description BLOOD RIGHT ANTECUBITAL  Final   Special Requests BOTTLES DRAWN AEROBIC AND ANAEROBIC 10CC  Final   Culture  Setup Time   Final    GRAM NEGATIVE COCCOBACILLI ANAEROBIC BOTTLE ONLY CRITICAL RESULT CALLED TO, READ BACK BY AND VERIFIED WITH: A. DAVIS,RN AT 1157 ON 112516 BY S. YARBROUGH    Culture PREVOTELLA SPECIES BETA LACTAMASE NEGATIVE   Final   Report Status 07/25/2015 FINAL  Final  Culture, blood (routine x 2)     Status: None   Collection Time: 07/18/15 12:53 PM  Result Value Ref Range Status   Specimen Description BLOOD LEFT HAND  Final   Special Requests BOTTLES DRAWN AEROBIC ONLY 3CC  Final   Culture NO GROWTH 5 DAYS  Final   Report Status 07/23/2015 FINAL  Final  Culture, blood (routine x 2)     Status: None   Collection Time: 07/19/15  2:10 PM  Result Value Ref Range Status   Specimen Description BLOOD RIGHT ANTECUBITAL  Final   Special Requests BOTTLES DRAWN AEROBIC ONLY  10CC  Final   Culture NO GROWTH 5 DAYS  Final   Report Status 07/24/2015 FINAL  Final  Culture,  blood (routine x 2)     Status: None   Collection Time: 07/19/15  2:18 PM  Result Value Ref Range Status   Specimen Description BLOOD RIGHT HAND  Final   Special Requests IN PEDIATRIC BOTTLE  3CC  Final   Culture NO GROWTH 5 DAYS  Final   Report Status 07/24/2015  FINAL  Final  MRSA PCR Screening     Status: None   Collection Time: 07/19/15  4:12 PM  Result Value Ref Range Status   MRSA by PCR NEGATIVE NEGATIVE Final    Comment:        The GeneXpert MRSA Assay (FDA approved for NASAL specimens only), is one component of a comprehensive MRSA colonization surveillance program. It is not intended to diagnose MRSA infection nor to guide or monitor treatment for MRSA infections.   Culture, routine-abscess     Status: None (Preliminary result)   Collection Time: 07/21/15  2:42 PM  Result Value Ref Range Status   Specimen Description ABSCESS LIVER  Final   Special Requests NONE  Final   Gram Stain   Final    ABUNDANT WBC PRESENT, PREDOMINANTLY PMN NO SQUAMOUS EPITHELIAL CELLS SEEN MODERATE GRAM POSITIVE COCCI IN PAIRS Performed at [facility]    Culture   Final    NO GROWTH 2 DAYS Performed at [facility]    Report Status PENDING  Incomplete    Studies/Results: Dg Abd 1 View  07/26/2015  CLINICAL DATA:  Ileus.  Abdominal discomfort. EXAM: ABDOMEN - 1 VIEW COMPARISON:  07/24/2015 FINDINGS: Distended small bowel loops continued of project over the abdomen. Abscess strain projects over the right upper quadrant. There is no obvious free intraperitoneal gas. There is contrast and gas in the colon. IMPRESSION: Stable ileus. Electronically Signed   By: Marybelle Killings M.D.   On: 07/26/2015 08:07   Dg Abd 1 View  07/24/2015  CLINICAL DATA:  Nasogastric tube placement EXAM: ABDOMEN - 1 VIEW COMPARISON:  None. FINDINGS: The tip of the nasogastric tube is in the fundus of the stomach. Abscess strain projects over the right upper quadrant. Visualize lungs are grossly clear.  Normal heart size. Gas-filled loops of bowel project over the upper abdomen. IMPRESSION: NG tube placement into the fundus of the stomach by radiological technologist. Electronically Signed   By: Marybelle Killings M.D.   On: 07/24/2015 17:55   Ct Abdomen Pelvis W Contrast  07/24/2015  CLINICAL DATA:  Sepsis, liver abscess, drain placement, increasing white count now 19,000, with plain film evidence of ileus EXAM: CT ABDOMEN AND PELVIS WITH CONTRAST TECHNIQUE: Multidetector CT imaging of the abdomen and pelvis was performed using the standard protocol following bolus administration of intravenous contrast. CONTRAST:  35mL OMNIPAQUE IOHEXOL 300 MG/ML  SOLN COMPARISON:  07/20/2015 FINDINGS: A small left and moderate right pleural effusion is present. Both are increased when compared to the prior study. There is anticipated bilateral dependent consolidation most consistent with atelectasis. An NG tube extends into the stomach. Known liver abscess is again identified as an area of heterogeneous low attenuation with internal enhancing septations and gas. A drainage catheter passes through the abscess via percutaneous placement. Previously cross sectional diameter was 7.1 x 5.0 cm. Essentially unchanged measurements are obtained today. Spleen and pancreas are normal. Adrenal glands show mild fullness, stable. There are bilateral renal cysts. There is a 2 mm nonobstructing stone lower pole left kidney. Bladder is normal. There is fluid into the rectum. There is diverticulosis of the descending and sigmoid colon. There is fluid throughout small bowel. There is mild proximal small bowel dilatation to 3.7 cm, although there is no abrupt cut off or caliber change to suggest obstruction. Appendix appears normal. There is evidence of moderate anasarca. There is a small volume of ascites in the abdomen and pelvis. There are no acute musculoskeletal findings.  IMPRESSION: Findings most consistent with small bowel ileus. Extensive  left colon diverticulosis. Increased bilateral pleural effusions. Anasarca with small but increased volume of ascites. Drainage catheter again passes through right hepatic abscess. Abscess is unchanged in size. There is now all multifocal air within the abscess. This could represent necrosis or may be related to the presence of drainage catheter which has been placed in the interval. Electronically Signed   By: Skipper Cliche M.D.   On: 07/24/2015 22:34   Dg Addison Bailey G Tube Plc W/fl-no Rad  07/24/2015  CLINICAL DATA:  NASO G TUBE PLACEMENT WITH FLUORO Fluoroscopy was utilized by the requesting physician.  No radiographic interpretation.     Assessment/Plan: Liver abscess GNR bacteremia (1/4 prevotella. Liver abscess Cx GPC on stain) Ileus arf  Total days of antibiotics: 7  Would continue zosyn Watch his temp and his WBC  Cr improving He had 1 BM yesterday, continue to watch this Await his abscess fluid         Bobby Rumpf Infectious Diseases (pager) [phone] [email or web address] 07/26/2015, 12:03 PM  LOS: 8 days

## 2015-07-26 NOTE — Care Management Important Message (Signed)
Important Message  Patient Details  Name: Brian [name] MRN: [id number] Date of Birth: Jan 09, 1934   Medicare Important Message Given:  Yes    Mayrene Bastarache P Prayan Ulin 07/26/2015, 1:36 PM

## 2015-07-27 ENCOUNTER — Inpatient Hospital Stay (HOSPITAL_COMMUNITY): Payer: Medicare Other

## 2015-07-27 DIAGNOSIS — R140 Abdominal distension (gaseous): Secondary | ICD-10-CM | POA: Insufficient documentation

## 2015-07-27 DIAGNOSIS — K567 Ileus, unspecified: Secondary | ICD-10-CM

## 2015-07-27 LAB — CBC
HEMATOCRIT: 28.3 % — AB (ref 39.0–52.0)
HEMOGLOBIN: 9.4 g/dL — AB (ref 13.0–17.0)
MCH: 27.8 pg (ref 26.0–34.0)
MCHC: 33.2 g/dL (ref 30.0–36.0)
MCV: 83.7 fL (ref 78.0–100.0)
Platelets: 552 10*3/uL — ABNORMAL HIGH (ref 150–400)
RBC: 3.38 MIL/uL — ABNORMAL LOW (ref 4.22–5.81)
RDW: 14.3 % (ref 11.5–15.5)
WBC: 22.3 10*3/uL — ABNORMAL HIGH (ref 4.0–10.5)

## 2015-07-27 LAB — BASIC METABOLIC PANEL
ANION GAP: 9 (ref 5–15)
BUN: 27 mg/dL — AB (ref 6–20)
CHLORIDE: 118 mmol/L — AB (ref 101–111)
CO2: 25 mmol/L (ref 22–32)
Calcium: 8.1 mg/dL — ABNORMAL LOW (ref 8.9–10.3)
Creatinine, Ser: 1.31 mg/dL — ABNORMAL HIGH (ref 0.61–1.24)
GFR calc Af Amer: 58 mL/min — ABNORMAL LOW (ref >60)
GFR calc non Af Amer: 50 mL/min — ABNORMAL LOW (ref >60)
GLUCOSE: 121 mg/dL — AB (ref 65–99)
POTASSIUM: 3.5 mmol/L (ref 3.5–5.1)
Sodium: 152 mmol/L — ABNORMAL HIGH (ref 135–145)

## 2015-07-27 LAB — OCCULT BLOOD X 1 CARD TO LAB, STOOL: Fecal Occult Bld: POSITIVE — AB

## 2015-07-27 LAB — GLUCOSE, CAPILLARY
GLUCOSE-CAPILLARY: 110 mg/dL — AB (ref 65–99)
GLUCOSE-CAPILLARY: 157 mg/dL — AB (ref 65–99)
GLUCOSE-CAPILLARY: 80 mg/dL (ref 65–99)
GLUCOSE-CAPILLARY: 84 mg/dL (ref 65–99)
Glucose-Capillary: 150 mg/dL — ABNORMAL HIGH (ref 65–99)
Glucose-Capillary: 102 mg/dL — ABNORMAL HIGH (ref 65–99)

## 2015-07-27 MED ORDER — BISACODYL 10 MG RE SUPP: 10.0000 mg | Freq: Every day | RECTAL | Status: DC | PRN

## 2015-07-27 NOTE — Progress Notes (Signed)
Patient moved his bowel this morning. Moderate amount of soft to loose dark green stool noted. No odor.

## 2015-07-27 NOTE — Consult Note (Signed)
[facility] Surgery Consult Note  Brian [name] 1934/02/29  [id number].    Requesting MD: Dr. Broadus John Chief Complaint/Reason for Consult:  Liver abscess and ileus  HPI:  81 y/o AA male with a PMH of DM2, admitted to the medicine service on 07/18/2015 after presented to the [facility] with generalized weakness, dizziness, chills, fatigue, fevers, and near syncope.  He notes N/V for about 2 weeks after having oysters.  He said the oysters were cooked, but still soft in the middle.  Every time he eats oysters he has a similar reaction so he attributed his current issues with the oysters.  He was started on IV vancomycin and Zosyn.  He remained hypotensive so an abdominal ultrasound was ordered which did not reveal acute intra-abdominal pathology.  Lab work showed transaminitis.  Patient was transferred to the stepdown unit on 07/19/2015 for closer monitoring. Then, CT scan of abdomen and pelvis performed on 07/20/2015 showed a hypoattenuating lesion within the liver. This was further worked up with an MRI of liver that showed findings compatible with multi-loculated abscess collection 8.6 x 5.8 x 7.9.  Interventional radiology was consulted for drain placement that was performed on 07/21/2015.  Blood cultures obtained on admission growing gram-negative bacilli Provotella, with repeat blood cultures show no growth.  Abscess culture was negative, but gram stain showed gram + cocci.  The first 2 days the drain emptied almost 433mL, but since 11/28 very minimal drainage has been emptied.  Repeat CT on 07/24/15 shows ileus, good drain placement, essentially no resolution of abscess.  We were consulted secondary to un-resolving ileus, and to give recommendation about liver abscess.  The patients pain is improved, no N/V since NG placed.  Passing flatus and has had 3 loose BM's.  No CP/SOB, fever/chills, headache, abdominal pain (0/10).  Only symptom is abdominal distension.  No radicular pain, no other  precipitating/alleviating factors.     ROS: All systems reviewed and otherwise negative except for as above  Family History  Problem Relation Age of Onset  . Congestive Heart Failure      Past Medical History  Diagnosis Date  . Type 2 diabetes mellitus ([place])   . Hypertension   . Hyperlipidemia   . Bladder stone   . History of bladder stone   . Nocturia   . Renal disorder   . Arthritis     Past Surgical History  Procedure Laterality Date  . Cystoscopy/retrograde/ureteroscopy/stone extraction with basket N/A 03/22/2013    Procedure: CYSTOSCOPY BLADDER STONE STONE EXTRACTION;  Surgeon: [name], MD;  Location: [place];  Service: Urology;  Laterality: N/A;  CYSTOSCOPY    . Tonsillectomy      Social History:  reports that he quit smoking about 22 years ago. His smoking use included Cigarettes. He has a 29.25 pack-year smoking history. He does not have any smokeless tobacco history on file. He reports that he drinks alcohol. He reports that he does not use illicit drugs.  Allergies:  Allergies  Allergen Reactions  . Niaspan [Niacin Er]   . Shellfish Allergy Other (See Comments)    Pt vomits  . Statins Other (See Comments)    Myalgia  . Zetia [Ezetimibe]     Medications Prior to Admission  Medication Sig Dispense Refill  . amLODipine (NORVASC) 10 MG tablet Take 1 tablet (10 mg total) by mouth daily. 90 tablet 3  . aspirin EC 81 MG tablet Take 81 mg by mouth daily.    . carvedilol (COREG)  6.25 MG tablet Take 1 tablet by mouth 2 (two) times daily.    Marland Kitchen docusate sodium (COLACE) 100 MG capsule Take 1 capsule (100 mg total) by mouth 2 (two) times daily. 60 capsule 5  . fenofibrate 160 MG tablet Take 1 tablet (160 mg total) by mouth daily. 30 tablet 5  . finasteride (PROSCAR) 5 MG tablet Take 1 tablet (5 mg total) by mouth daily. 90 tablet 0  . glimepiride (AMARYL) 2 MG tablet Take 1 tablet (2 mg total) by mouth 2 (two) times daily. 60 tablet 5  .  Insulin Detemir (LEVEMIR) 100 UNIT/ML Pen Inject 8 Units into the skin daily. 3 pen 3  . Multiple Vitamin (MULTIVITAMIN WITH MINERALS) TABS tablet Take 1 tablet by mouth daily.    . quinapril (ACCUPRIL) 20 MG tablet Take 1 tablet (20 mg total) by mouth 2 (two) times daily. 60 tablet 5  . sitaGLIPtin (JANUVIA) 25 MG tablet Take 1 tablet (25 mg total) by mouth daily. 30 tablet 5  . Tamsulosin HCl (FLOMAX) 0.4 MG CAPS Take 0.4 mg by mouth daily.    . Blood Glucose Monitoring Suppl (ONE TOUCH ULTRA 2) W/DEVICE KIT     . Cholecalciferol (VITAMIN D3) 1000 UNITS CAPS Take 1 capsule by mouth daily.    . diphenoxylate-atropine (LOMOTIL) 2.5-0.025 MG per tablet Take 1 tablet by mouth 4 (four) times daily as needed for diarrhea or loose stools. 30 tablet 5  . glucose blood test strip Use as instructed 300 each 3  . meloxicam (MOBIC) 7.5 MG tablet Take 7.5 mg by mouth 2 (two) times daily.       Blood pressure 155/78, pulse 96, temperature 98.1 F (36.7 C), temperature source Oral, resp. rate 18, height 5\' 11"  (1.803 m), weight 83.4 kg (183 lb 13.8 oz), SpO2 99 %. Physical Exam: General: pleasant, WD/WN AA male who is laying in bed in NAD HEENT: head is normocephalic, atraumatic.  Sclera are noninjected.  PERRL.  Ears and nose without any masses or lesions. NG in place.  Mouth is pink and moist Heart: regular, rate, and rhythm.  No obvious murmurs, gallops, or rubs noted.  Palpable pedal pulses bilaterally Lungs: CTAB, no wheezes, rhonchi, or rales noted.  Respiratory effort nonlabored, good effort Abd: soft, distended, NT, +BS, no masses, hernias, or organomegaly, no scars MS: all 4 extremities are symmetrical with no cyanosis, clubbing, or edema. Skin: warm and dry with no masses, lesions, or rashes Psych: A&Ox3 with an appropriate affect. Neuro: Extremity CSM intact bilaterally, normal speech, some LE edema  Results for orders placed or performed during the hospital encounter of 07/18/15 (from the  past 48 hour(s))  Glucose, capillary     Status: Abnormal   Collection Time: 07/25/15 11:37 AM  Result Value Ref Range   Glucose-Capillary 233 (H) 65 - 99 mg/dL  Glucose, capillary     Status: Abnormal   Collection Time: 07/25/15  4:37 PM  Result Value Ref Range   Glucose-Capillary 175 (H) 65 - 99 mg/dL  Glucose, capillary     Status: Abnormal   Collection Time: 07/25/15  8:08 PM  Result Value Ref Range   Glucose-Capillary 137 (H) 65 - 99 mg/dL  Glucose, capillary     Status: None   Collection Time: 07/26/15 12:12 AM  Result Value Ref Range   Glucose-Capillary 99 65 - 99 mg/dL  Glucose, capillary     Status: Abnormal   Collection Time: 07/26/15  4:29 AM  Result Value Ref Range  Glucose-Capillary 111 (H) 65 - 99 mg/dL  Basic metabolic panel     Status: Abnormal   Collection Time: 07/26/15  6:02 AM  Result Value Ref Range   Sodium 151 (H) 135 - 145 mmol/L   Potassium 3.8 3.5 - 5.1 mmol/L   Chloride 118 (H) 101 - 111 mmol/L   CO2 23 22 - 32 mmol/L   Glucose, Bld 131 (H) 65 - 99 mg/dL   BUN 32 (H) 6 - 20 mg/dL   Creatinine, Ser 1.30 (H) 0.61 - 1.24 mg/dL   Calcium 8.0 (L) 8.9 - 10.3 mg/dL   GFR calc non Af Amer 51 (L) >60 mL/min   GFR calc Af Amer 59 (L) >60 mL/min    Comment: (NOTE) The eGFR has been calculated using the CKD EPI equation. This calculation has not been validated in all clinical situations. eGFR's persistently <60 mL/min signify possible Chronic Kidney Disease.    Anion gap 10 5 - 15  CBC     Status: Abnormal   Collection Time: 07/26/15  6:02 AM  Result Value Ref Range   WBC 20.7 (H) 4.0 - 10.5 K/uL   RBC 3.17 (L) 4.22 - 5.81 MIL/uL   Hemoglobin 9.0 (L) 13.0 - 17.0 g/dL   HCT 26.6 (L) 39.0 - 52.0 %   MCV 83.9 78.0 - 100.0 fL   MCH 28.4 26.0 - 34.0 pg   MCHC 33.8 30.0 - 36.0 g/dL   RDW 14.5 11.5 - 15.5 %   Platelets 502 (H) 150 - 400 K/uL  Glucose, capillary     Status: Abnormal   Collection Time: 07/26/15  8:07 AM  Result Value Ref Range    Glucose-Capillary 151 (H) 65 - 99 mg/dL   Comment 1 Notify RN    Comment 2 Document in Chart   Glucose, capillary     Status: Abnormal   Collection Time: 07/26/15 11:45 AM  Result Value Ref Range   Glucose-Capillary 157 (H) 65 - 99 mg/dL   Comment 1 Notify RN    Comment 2 Document in Chart   Glucose, capillary     Status: Abnormal   Collection Time: 07/26/15  4:05 PM  Result Value Ref Range   Glucose-Capillary 103 (H) 65 - 99 mg/dL  Glucose, capillary     Status: None   Collection Time: 07/26/15  7:59 PM  Result Value Ref Range   Glucose-Capillary 90 65 - 99 mg/dL  Glucose, capillary     Status: None   Collection Time: 07/26/15 11:59 PM  Result Value Ref Range   Glucose-Capillary 80 65 - 99 mg/dL  Glucose, capillary     Status: None   Collection Time: 07/27/15  4:01 AM  Result Value Ref Range   Glucose-Capillary 84 65 - 99 mg/dL  Basic metabolic panel     Status: Abnormal   Collection Time: 07/27/15  6:44 AM  Result Value Ref Range   Sodium 152 (H) 135 - 145 mmol/L   Potassium 3.5 3.5 - 5.1 mmol/L   Chloride 118 (H) 101 - 111 mmol/L   CO2 25 22 - 32 mmol/L   Glucose, Bld 121 (H) 65 - 99 mg/dL   BUN 27 (H) 6 - 20 mg/dL   Creatinine, Ser 1.31 (H) 0.61 - 1.24 mg/dL   Calcium 8.1 (L) 8.9 - 10.3 mg/dL   GFR calc non Af Amer 50 (L) >60 mL/min   GFR calc Af Amer 58 (L) >60 mL/min    Comment: (NOTE) The eGFR has  been calculated using the CKD EPI equation. This calculation has not been validated in all clinical situations. eGFR's persistently <60 mL/min signify possible Chronic Kidney Disease.    Anion gap 9 5 - 15  CBC     Status: Abnormal   Collection Time: 07/27/15  6:44 AM  Result Value Ref Range   WBC 22.3 (H) 4.0 - 10.5 K/uL   RBC 3.38 (L) 4.22 - 5.81 MIL/uL   Hemoglobin 9.4 (L) 13.0 - 17.0 g/dL   HCT 28.3 (L) 39.0 - 52.0 %   MCV 83.7 78.0 - 100.0 fL   MCH 27.8 26.0 - 34.0 pg   MCHC 33.2 30.0 - 36.0 g/dL   RDW 14.3 11.5 - 15.5 %   Platelets 552 (H) 150 - 400 K/uL   Glucose, capillary     Status: Abnormal   Collection Time: 07/27/15  7:50 AM  Result Value Ref Range   Glucose-Capillary 110 (H) 65 - 99 mg/dL   Dg Abd 1 View  07/27/2015  CLINICAL DATA:  ILEUS,NG TUBE.ABD DISCOMFORT EXAM: ABDOMEN - 1 VIEW COMPARISON:  07/26/2015 and CT 07/24/2015 FINDINGS: There is persistent significant dilatation of small bowel loops, with residual contrast in nondilated loops of colon. Colonic diverticula are present. There is no evidence for free intraperitoneal air on this supine view of the abdomen. A nasogastric tube is identified in the left upper quadrant overlying the region of the stomach. Liver abscess drain is partially imaged in the right upper quadrant. IMPRESSION: Pattern consistent with small bowel obstruction. Electronically Signed   By: Nolon Nations M.D.   On: 07/27/2015 08:20   Dg Abd 1 View  07/26/2015  CLINICAL DATA:  Ileus.  Abdominal discomfort. EXAM: ABDOMEN - 1 VIEW COMPARISON:  07/24/2015 FINDINGS: Distended small bowel loops continued of project over the abdomen. Abscess strain projects over the right upper quadrant. There is no obvious free intraperitoneal gas. There is contrast and gas in the colon. IMPRESSION: Stable ileus. Electronically Signed   By: Marybelle Killings M.D.   On: 07/26/2015 08:07      Assessment/Plan HD# 10 (07/18/15) Admitted for chills, near syncope, dehydration, fatigue, fevers Sepsis due to Right liver abscess Ileus - 2* liver abscess (8.6 x 5.8 x 7.9) Leukocytosis - 22.3 -Unknown cause, no clues on abdominal CT or MRI, abscess shows gram + cocci on gram stain, but neg cultures.  Blood cultures showed PREVOTELLA SPECIES, BETA LACTAMASE NEGATIVE.  On Zosyn (11/23-11/23, 11/29- current) -Good drainage from perc drain (07/21/15 - IR) the first 2 days after placement, but very minimal drainage since repeat CT done on 11/29.  Therefore not adequately drained.   -Talked to Dr. Kathlene Cote about upsizing drain, manipulation, or  increased flushing given minimal drain output since last CT scan.  Repeat CT would not likely be helpful at this time because the no significant drainage since CT on 29th.  Consider repeat CT to check for resolution at some point, but see what flushing's do first.  I would expect given the size to see much more output than has come out so far.   -Dulcolax prn -Mobilize to chair and ambulate in halls as able to aid in bowel function -Check heme-occult, will likely need colonoscopy at some point since he hasn't had one in >10 years. -Limit narcotics  Extensive left colon diverticulosis Acute on CKD Transaminitis Elevated Troponins DM HTN   Nat Christen, [facility] Surgery 07/27/2015, 10:31 AM Pager: [phone]

## 2015-07-27 NOTE — Progress Notes (Signed)
[facility] PROGRESS NOTE  Brian [name] [id number] DOB: [birth year] DOA: 07/18/2015 PCP: [name], MD  Interim summary Brian [name] is a 81 year old gentleman with a past medical history of diabetes mellitus, admitted to the medicine service on 07/18/2015 when he presented with complaints of generalized weakness, dizziness, chills that was social with hyperglycemia. He reports having several episodes of nausea and vomiting earlier this week after having oysters. Initial lab work revealed acute on chronic kidney injury having creatinine of 2.85 with BUN of 36. He was found to be febrile having a temperature of 103 and started on broad-spectrum IV antimicrobial therapy with vancomycin and Zosyn. Patient also remained hypotensive overnight with systolic blood pressures in the 80s to 90s.workup revealed an abdominal ultrasoundwhich did not reveal acute intra-abdominal pathology. Lab work did reveal elevated transaminases with AST of 125 and ALT of 90.other workup included a chest x-ray and urinalysis which was unremarkable. Patient was transferred to the stepdown unit on 07/19/2015 for closer monitoring. CT scan of abdomen and pelvis performed on 07/20/2015 showed a hypoattenuating lesion within the liver. This was further worked up with an MRI of liver that showed findings compatible with abscess collection. Interventional radiology was consulted for drain placement that was performed on 07/21/2015. Blood cultures obtained on admission growing gram-negative bacilli, with repeat blood cultures show no growth to date. Cultures from abscess growing gram-positive cocci.  Awaiting organism identification and susceptibility testing.  Assessment/Plan: 1. Severe sepsis -improving -He had reported eating oysters past week after which he developed multiple episodes of nausea and vomiting that was associated with diarrhea. Vibrio parahaemolyticus was considered -On 07/20/2015: CT scan of abdomen  and pelvis performed revealing 7.1 x 5.0 x 5.7 cm hypoattenuating lesion within the liver. Radiology reporting that this could represent an abscess or perhaps primary hepatic malignancy. This was further worked up with MRI of liver that demonstrated findings compatible with abscess collection.    -On 07/21/2015 blood cultures drawn from 07/18/2015 growing 1/4 with Prevotella. Ceftriaxone changed to Fortaz 1 g IV every 12 hours and Vancomycin discontinued. Interventional radiology placed drain catheter of liver abscess on 11/26. Gram stain showing gram positive cocci in pairs from abscess.  -Dr. Baxter Flattery of infectious disease, consulting, suspects that a possible source may have been related to translocation of bacteria in setting of gastroenteritis. -CT 11/29 without change in size of abscess, but too early to know, minimal output from drain in past 2days, may need additional drainage -i think he will need a colonoscopy down the road, wife reports last one >5-10years ago -reportedly normal   2.  Ileus -Overnight 11/29, patient developed multiple episodes of nausea and vomiting and noted to have distended abdomen,  abdominal x-ray that showed adynamic ileus. -continue NG decompression, NPO, IVF, KUb in am, had BM last night, but still distended and KUB without much change -given abscess, worsening leukocytosis and unresolving ileus will ask CCS to eval  3.  Acute on chronic renal failure -Likely secondary to ATN in setting of hypotension or perhaps sepsis/critical illness -baseline between 1.4 1.7,on admit creatinine of 2.85 with BUN of 36 -improving with NS, back to baseline  4.  Elevated transaminases -due to abscesses most likely -S/P drainage of liver abscess on 07/21/2015.  -Labs showing improvement to elevated liver enzymes   5.  Elevated troponin. -Lab work showing upward trend in his troponin to 0.30, no chest pain. -related to demand ischemia in setting of critical illness, Troponins  trending down.  6. Diabetes mellitus -  cut down lantus, due to NPO, ileus  6.  History of hypertension. -He presented with hypotension for which antihypertensive medications were discontinued   DVT prophylaxis. Lovenox  Code Status: full code Family Communication: daughter at bedside Disposition Plan: pending improvement in ileus and abscesses   Antibiotics:  Vancomycin started on 07/18/2015 stopped on 07/20/2015  Zosyn discontinued on 07/19/2015  Ceftriaxone started on 07/19/2015 stopped on 07/20/2015   Doxycycline started on 07/19/2015   [name] started on 07/20/2015  HPI/Subjective: Had a BM again last night and this am, feels ok, no vomiting  Objective: Filed Vitals:   07/27/15 0530 07/27/15 0844  BP: 144/78 155/78  Pulse: 98 96  Temp: 98.7 F (37.1 C) 98.1 F (36.7 C)  Resp: 18 18    Intake/Output Summary (Last 24 hours) at 07/27/15 1341 Last data filed at 07/27/15 0955  Gross per 24 hour  Intake 1073.75 ml  Output   1208 ml  Net -134.25 ml   Filed Weights   07/24/15 2034 07/25/15 2009 07/26/15 2001  Weight: 79 kg (174 lb 2.6 oz) 79.2 kg (174 lb 9.7 oz) 83.4 kg (183 lb 13.8 oz)    Exam:   General:  ill-appearing, AAOx3, no distress.   Cardiovascular: regular rate and rhythm normal S1-S2  Respiratory: CTAB  Abdomen: Abdomen is distended, tympanic, non tender, diminished BS  Musculoskeletal: no edema  Data Reviewed: Basic Metabolic Panel:  Recent Labs Lab 07/23/15 0456 07/24/15 0522 07/25/15 0633 07/26/15 0602 07/27/15 0644  NA 143 146* 149* 151* 152*  K 3.7 3.6 3.8 3.8 3.5  CL 115* 114* 114* 118* 118*  CO2 16* 22 25 23 25   GLUCOSE 155* 260* 186* 131* 121*  BUN 27* 28* 32* 32* 27*  CREATININE 1.43* 1.32* 1.47* 1.30* 1.31*  CALCIUM 8.4* 8.6* 8.4* 8.0* 8.1*   Liver Function Tests:  Recent Labs Lab 07/22/15 0307 07/24/15 0522  AST 66* 27  ALT 90* 55  ALKPHOS 61 73  BILITOT 0.6 0.9  PROT 5.4* 5.8*  ALBUMIN 1.9* 2.0*   No  results for input(s): LIPASE, AMYLASE in the last 168 hours. No results for input(s): AMMONIA in the last 168 hours. CBC:  Recent Labs Lab 07/23/15 0456 07/24/15 0522 07/25/15 0633 07/26/15 0602 07/27/15 0644  WBC 16.3* 19.9* 20.8* 20.7* 22.3*  NEUTROABS  --  17.1*  --   --   --   HGB 9.6* 10.1* 10.1* 9.0* 9.4*  HCT 26.9* 28.2* 29.5* 26.6* 28.3*  MCV 81.8 80.6 81.7 83.9 83.7  PLT 365 463* 562* 502* 552*   Cardiac Enzymes: No results for input(s): CKTOTAL, CKMB, CKMBINDEX, TROPONINI in the last 168 hours. BNP (last 3 results) No results for input(s): BNP in the last 8760 hours.  ProBNP (last 3 results) No results for input(s): PROBNP in the last 8760 hours.  CBG:  Recent Labs Lab 07/26/15 1959 07/26/15 2359 07/27/15 0401 07/27/15 0750 07/27/15 1159  GLUCAP 90 80 84 110* 150*    Recent Results (from the past 240 hour(s))  Urine culture     Status: None   Collection Time: 07/18/15 12:03 PM  Result Value Ref Range Status   Specimen Description URINE, CLEAN CATCH  Final   Special Requests NONE  Final   Culture MULTIPLE SPECIES PRESENT, SUGGEST RECOLLECTION  Final   Report Status 07/19/2015 FINAL  Final  Culture, blood (routine x 2)     Status: None   Collection Time: 07/18/15 12:50 PM  Result Value Ref Range Status   Specimen  Description BLOOD RIGHT ANTECUBITAL  Final   Special Requests BOTTLES DRAWN AEROBIC AND ANAEROBIC 10CC  Final   Culture  Setup Time   Final    GRAM NEGATIVE COCCOBACILLI ANAEROBIC BOTTLE ONLY CRITICAL RESULT CALLED TO, READ BACK BY AND VERIFIED WITH: A. DAVIS,RN AT 1157 ON [id number] BY S. YARBROUGH    Culture PREVOTELLA SPECIES BETA LACTAMASE NEGATIVE   Final   Report Status 07/25/2015 FINAL  Final  Culture, blood (routine x 2)     Status: None   Collection Time: 07/18/15 12:53 PM  Result Value Ref Range Status   Specimen Description BLOOD LEFT HAND  Final   Special Requests BOTTLES DRAWN AEROBIC ONLY 3CC  Final   Culture NO GROWTH 5 DAYS   Final   Report Status 07/23/2015 FINAL  Final  Culture, blood (routine x 2)     Status: None   Collection Time: 07/19/15  2:10 PM  Result Value Ref Range Status   Specimen Description BLOOD RIGHT ANTECUBITAL  Final   Special Requests BOTTLES DRAWN AEROBIC ONLY  10CC  Final   Culture NO GROWTH 5 DAYS  Final   Report Status 07/24/2015 FINAL  Final  Culture, blood (routine x 2)     Status: None   Collection Time: 07/19/15  2:18 PM  Result Value Ref Range Status   Specimen Description BLOOD RIGHT HAND  Final   Special Requests IN PEDIATRIC BOTTLE  3CC  Final   Culture NO GROWTH 5 DAYS  Final   Report Status 07/24/2015 FINAL  Final  MRSA PCR Screening     Status: None   Collection Time: 07/19/15  4:12 PM  Result Value Ref Range Status   MRSA by PCR NEGATIVE NEGATIVE Final    Comment:        The GeneXpert MRSA Assay (FDA approved for NASAL specimens only), is one component of a comprehensive MRSA colonization surveillance program. It is not intended to diagnose MRSA infection nor to guide or monitor treatment for MRSA infections.   Culture, routine-abscess     Status: None   Collection Time: 07/21/15  2:42 PM  Result Value Ref Range Status   Specimen Description ABSCESS LIVER  Final   Special Requests NONE  Final   Gram Stain   Final    ABUNDANT WBC PRESENT, PREDOMINANTLY PMN NO SQUAMOUS EPITHELIAL CELLS SEEN MODERATE GRAM POSITIVE COCCI IN PAIRS Performed at [facility]    Culture   Final    NO GROWTH 2 DAYS Performed at [facility]    Report Status 07/26/2015 FINAL  Final     Studies: Dg Abd 1 View  07/27/2015  CLINICAL DATA:  ILEUS,NG TUBE.ABD DISCOMFORT EXAM: ABDOMEN - 1 VIEW COMPARISON:  07/26/2015 and CT 07/24/2015 FINDINGS: There is persistent significant dilatation of small bowel loops, with residual contrast in nondilated loops of colon. Colonic diverticula are present. There is no evidence for free intraperitoneal air on this supine view of  the abdomen. A nasogastric tube is identified in the left upper quadrant overlying the region of the stomach. Liver abscess drain is partially imaged in the right upper quadrant. IMPRESSION: Pattern consistent with small bowel obstruction. Electronically Signed   By: Nolon Nations M.D.   On: 07/27/2015 08:20   Dg Abd 1 View  07/26/2015  CLINICAL DATA:  Ileus.  Abdominal discomfort. EXAM: ABDOMEN - 1 VIEW COMPARISON:  07/24/2015 FINDINGS: Distended small bowel loops continued of project over the abdomen. Abscess strain projects over the right upper  quadrant. There is no obvious free intraperitoneal gas. There is contrast and gas in the colon. IMPRESSION: Stable ileus. Electronically Signed   By: Marybelle Killings M.D.   On: 07/26/2015 08:07    Scheduled Meds: . aspirin  81 mg Oral Daily  . enoxaparin (LOVENOX) injection  40 mg Subcutaneous Q24H  . finasteride  5 mg Oral Daily  . insulin aspart  0-15 Units Subcutaneous 6 times per day  . insulin detemir  6 Units Subcutaneous Daily  . piperacillin-tazobactam (ZOSYN)  IV  3.375 g Intravenous Q8H  . sodium chloride  3 mL Intravenous Q12H  . tamsulosin  0.4 mg Oral Daily   Continuous Infusions: . sodium chloride 75 mL/hr at 07/27/15 0614    Principal Problem:   Sepsis ([place]) Active Problems:   Hypertension goal BP (blood pressure) < 140/90   Hyperlipidemia LDL goal <100   Chronic kidney disease (CKD), stage III (moderate)   Anemia of chronic renal failure   Acute renal failure (ARF) (HCC)   Transaminitis   Diabetes mellitus, insulin dependent (IDDM), uncontrolled ([facility]CC)   Leukocytosis   Acute kidney injury ([place])   SOB (shortness of breath)   Liver abscess   Fever and chills   Gram-negative bacteremia ([place])    Time spent: 35 min    [facility] Pager [phone] If 7PM-7AM, please contact night-coverage at [email or web address], password [facility] 07/27/2015, 1:41 PM  LOS: 9 days

## 2015-07-27 NOTE — Progress Notes (Signed)
Ambulated in the hall using a walker approximately 29ft. Tolerated well.

## 2015-07-27 NOTE — Progress Notes (Signed)
Patient ID: Brian [name], male   DOB: 31-Mar-1934, 81 y.o.   MRN: [id number]          Subjective:  Pt sitting up in chair; no new c/o; denies sig abd pain; NG tube in place  Allergies: Niaspan; Shellfish allergy; Statins; and Zetia  Medications: Prior to Admission medications   Medication Sig Start Date End Date Taking? Authorizing Provider  amLODipine (NORVASC) 10 MG tablet Take 1 tablet (10 mg total) by mouth daily. 07/10/15  Yes [name], MD  aspirin EC 81 MG tablet Take 81 mg by mouth daily.   Yes Historical Provider, MD  carvedilol (COREG) 6.25 MG tablet Take 1 tablet by mouth 2 (two) times daily. 02/27/15  Yes Historical Provider, MD  docusate sodium (COLACE) 100 MG capsule Take 1 capsule (100 mg total) by mouth 2 (two) times daily. 04/24/15  Yes [name], MD  fenofibrate 160 MG tablet Take 1 tablet (160 mg total) by mouth daily. 06/05/15  Yes [name], MD  finasteride (PROSCAR) 5 MG tablet Take 1 tablet (5 mg total) by mouth daily. 06/15/15  Yes [name], MD  glimepiride (AMARYL) 2 MG tablet Take 1 tablet (2 mg total) by mouth 2 (two) times daily. 06/05/15  Yes [name], MD  Insulin Detemir (LEVEMIR) 100 UNIT/ML Pen Inject 8 Units into the skin daily. 05/08/15  Yes [name], MD  Multiple Vitamin (MULTIVITAMIN WITH MINERALS) TABS tablet Take 1 tablet by mouth daily.   Yes Historical Provider, MD  quinapril (ACCUPRIL) 20 MG tablet Take 1 tablet (20 mg total) by mouth 2 (two) times daily. 02/14/15  Yes [name], MD  sitaGLIPtin (JANUVIA) 25 MG tablet Take 1 tablet (25 mg total) by mouth daily. 05/03/15  Yes [name], MD  Tamsulosin HCl (FLOMAX) 0.4 MG CAPS Take 0.4 mg by mouth daily.   Yes Historical Provider, MD  Blood Glucose Monitoring Suppl (ONE TOUCH ULTRA 2) W/DEVICE KIT  04/24/15   Historical Provider, MD  Cholecalciferol (VITAMIN D3) 1000 UNITS CAPS Take 1 capsule by mouth daily.    Historical Provider, MD    diphenoxylate-atropine (LOMOTIL) 2.5-0.025 MG per tablet Take 1 tablet by mouth 4 (four) times daily as needed for diarrhea or loose stools. 04/24/15   [name], MD  glucose blood test strip Use as instructed 03/28/15   [name], MD  meloxicam (MOBIC) 7.5 MG tablet Take 7.5 mg by mouth 2 (two) times daily.     Historical Provider, MD     Vital Signs: BP 155/78 mmHg  Pulse 96  Temp(Src) 98.1 F (36.7 C) (Oral)  Resp 18  Ht $R'5\' 11"'en$  (1.803 m)  Wt 183 lb 13.8 oz (83.4 kg)  BMI 25.66 kg/m2  SpO2 99%  Physical Examawake/alert; RUQ /hepatic drain intact, insertion site mildly tender; output small amt bloody fluid in JP bulb; cx's neg  Imaging: Dg Abd 1 View  07/27/2015  CLINICAL DATA:  ILEUS,NG TUBE.ABD DISCOMFORT EXAM: ABDOMEN - 1 VIEW COMPARISON:  07/26/2015 and CT 07/24/2015 FINDINGS: There is persistent significant dilatation of small bowel loops, with residual contrast in nondilated loops of colon. Colonic diverticula are present. There is no evidence for free intraperitoneal air on this supine view of the abdomen. A nasogastric tube is identified in the left upper quadrant overlying the region of the stomach. Liver abscess drain is partially imaged in the right upper quadrant. IMPRESSION: Pattern consistent with small bowel obstruction. Electronically Signed   By: Nolon Nations M.D.   On: 07/27/2015  08:20   Dg Abd 1 View  07/26/2015  CLINICAL DATA:  Ileus.  Abdominal discomfort. EXAM: ABDOMEN - 1 VIEW COMPARISON:  07/24/2015 FINDINGS: Distended small bowel loops continued of project over the abdomen. Abscess strain projects over the right upper quadrant. There is no obvious free intraperitoneal gas. There is contrast and gas in the colon. IMPRESSION: Stable ileus. Electronically Signed   By: Marybelle Killings M.D.   On: 07/26/2015 08:07   Dg Abd 1 View  07/24/2015  CLINICAL DATA:  Nasogastric tube placement EXAM: ABDOMEN - 1 VIEW COMPARISON:  None. FINDINGS: The tip of the  nasogastric tube is in the fundus of the stomach. Abscess strain projects over the right upper quadrant. Visualize lungs are grossly clear. Normal heart size. Gas-filled loops of bowel project over the upper abdomen. IMPRESSION: NG tube placement into the fundus of the stomach by radiological technologist. Electronically Signed   By: Marybelle Killings M.D.   On: 07/24/2015 17:55   Ct Abdomen Pelvis W Contrast  07/24/2015  CLINICAL DATA:  Sepsis, liver abscess, drain placement, increasing white count now 19,000, with plain film evidence of ileus EXAM: CT ABDOMEN AND PELVIS WITH CONTRAST TECHNIQUE: Multidetector CT imaging of the abdomen and pelvis was performed using the standard protocol following bolus administration of intravenous contrast. CONTRAST:  106mL OMNIPAQUE IOHEXOL 300 MG/ML  SOLN COMPARISON:  07/20/2015 FINDINGS: A small left and moderate right pleural effusion is present. Both are increased when compared to the prior study. There is anticipated bilateral dependent consolidation most consistent with atelectasis. An NG tube extends into the stomach. Known liver abscess is again identified as an area of heterogeneous low attenuation with internal enhancing septations and gas. A drainage catheter passes through the abscess via percutaneous placement. Previously cross sectional diameter was 7.1 x 5.0 cm. Essentially unchanged measurements are obtained today. Spleen and pancreas are normal. Adrenal glands show mild fullness, stable. There are bilateral renal cysts. There is a 2 mm nonobstructing stone lower pole left kidney. Bladder is normal. There is fluid into the rectum. There is diverticulosis of the descending and sigmoid colon. There is fluid throughout small bowel. There is mild proximal small bowel dilatation to 3.7 cm, although there is no abrupt cut off or caliber change to suggest obstruction. Appendix appears normal. There is evidence of moderate anasarca. There is a small volume of ascites in the  abdomen and pelvis. There are no acute musculoskeletal findings. IMPRESSION: Findings most consistent with small bowel ileus. Extensive left colon diverticulosis. Increased bilateral pleural effusions. Anasarca with small but increased volume of ascites. Drainage catheter again passes through right hepatic abscess. Abscess is unchanged in size. There is now all multifocal air within the abscess. This could represent necrosis or may be related to the presence of drainage catheter which has been placed in the interval. Electronically Signed   By: Skipper Cliche M.D.   On: 07/24/2015 22:34   Dg Abd Acute W/chest  07/24/2015  CLINICAL DATA:  Abdominal pain and distention, onset tonight EXAM: DG ABDOMEN ACUTE W/ 1V CHEST COMPARISON:  07/21/2015 FINDINGS: There is abnormal dilatation and loop stacking of small bowel without significant mural thickening. No free intraperitoneal air. This may represent adynamic ileus. Obstruction not entirely excluded. The right hepatic lobe abscess drainage catheter appears unchanged in position. There are mild linear opacities in both lung bases which are probably atelectatic. There probably is a small right pleural effusion. IMPRESSION: 1. Abnormal dilatation of small bowel. Adynamic ileus is more likely than  mechanical obstruction. 2. Unchanged position of the liver abscess drainage catheter. 3. Atelectatic appearing basilar lung opacities bilaterally. Probable small right pleural effusion. Electronically Signed   By: Andreas Newport M.D.   On: 07/24/2015 07:04   Dg Addison Bailey G Tube Plc W/fl-no Rad  07/24/2015  CLINICAL DATA:  NASO G TUBE PLACEMENT WITH FLUORO Fluoroscopy was utilized by the requesting physician.  No radiographic interpretation.    Labs:  CBC:  Recent Labs  07/24/15 0522 07/25/15 0633 07/26/15 0602 07/27/15 0644  WBC 19.9* 20.8* 20.7* 22.3*  HGB 10.1* 10.1* 9.0* 9.4*  HCT 28.2* 29.5* 26.6* 28.3*  PLT 463* 562* 502* 552*    COAGS:  Recent  Labs  07/19/15 1625 07/21/15 0256  INR 1.51* 1.42  APTT 40* 41*    BMP:  Recent Labs  07/24/15 0522 07/25/15 0633 07/26/15 0602 07/27/15 0644  NA 146* 149* 151* 152*  K 3.6 3.8 3.8 3.5  CL 114* 114* 118* 118*  CO2 $Re'22 25 23 25  'oDJ$ GLUCOSE 260* 186* 131* 121*  BUN 28* 32* 32* 27*  CALCIUM 8.6* 8.4* 8.0* 8.1*  CREATININE 1.32* 1.47* 1.30* 1.31*  GFRNONAA 50* 44* 51* 50*  GFRAA 58* 51* 59* 58*    LIVER FUNCTION TESTS:  Recent Labs  07/19/15 1418 07/20/15 0547 07/22/15 0307 07/24/15 0522  BILITOT 0.8 0.7 0.6 0.9  AST 211* 179* 66* 27  ALT 150* 142* 90* 55  ALKPHOS 79 60 61 73  PROT 6.7 5.4* 5.4* 5.8*  ALBUMIN 2.6* 2.2* 1.9* 2.0*    Assessment and Plan: S/p right hepatic abscess drainage 11/26; drain fluid cx's neg; afebrile; WBC 22.3(20.7), hgb 9.4, creat 1.31(1.3); abd film with SBO- NG in place;  last CT done 11/29 with unchanged size of abscess; new JP bulb attached to drain today and flushed with 10 cc sterile NS with minimal return; cont flushes for now; check f/u CT next week   Signed: D. Rowe Robert 07/27/2015, 12:10 PM   I spent a total of 15 minutes at the the patient's bedside AND on the patient's hospital floor or unit, greater than 50% of which was counseling/coordinating care for hepatic abscess drain

## 2015-07-27 NOTE — Progress Notes (Signed)
ANTIBIOTIC CONSULT NOTE - Follow-up  Pharmacy Consult for Zosyn Indication: gram negative bacteremia with liver abscess  Patient Measurements: Height: 5\' 11"  (180.3 cm) Weight: 183 lb 13.8 oz (83.4 kg) IBW/kg (Calculated) : 75.3  Vital Signs: Temp: 98.1 F (36.7 C) (12/02 0844) Temp Source: Oral (12/02 0844) BP: 155/78 mmHg (12/02 0844) Pulse Rate: 96 (12/02 0844)    Labs:  Recent Labs  07/25/15 [id number] 07/26/15 0602 07/27/15 0644  WBC 20.8* 20.7* 22.3*  HGB 10.1* 9.0* 9.4*  PLT 562* 502* 552*  CREATININE 1.47* 1.30* 1.31*   Microbiology: Cx data:  11/23 blood: B-lactamase negative, Prevotella species. 11/23 urine: multiple species 11/24 blood: ngF 11/26 liver abscess: neg final  Anti-infective's  vanc 11/23>>11/25 Zosyn 11/23>>11/24 - restarted 11/29 >>  rocephin 11/24>>11/25 Doxy 11/24>>11/24 Ceftaz 11/25 >> 11/29 Flagyl 11/25>>11/29  Assessment: 63 yoM with B-lactamase negative, Prevotella species bacteremia and liver abscess. Currently on Zosyn. WBC increasing, now 22.3, afeb  Goal of Therapy:  treatment of infection   Plan:  1. Continue Zosyn 3.375mg  Q8H 2. Following daily; notes prn   Darl Pikes, PharmD Clinical Pharmacist- Resident Pager: [phone]   07/27/2015, 10:36 AM

## 2015-07-27 NOTE — Progress Notes (Signed)
PT Cancellation Note  Patient Details Name: Brian [name] MRN: [id number] DOB: 06/07/1934   Cancelled Treatment:    Reason Eval/Treat Not Completed: Fatigue/lethargy limiting ability to participate; family reports pt just settled back in bed after bed change due to spilling canister on bed.  Reports has had two bowel movements and family states plan to have him walk twice more today.  Will cancel due to fatigue at this time.   Angelle Isais,CYNDI 07/27/2015, 2:30 PM  Magda Kiel, [place] 07/27/2015

## 2015-07-27 NOTE — Progress Notes (Signed)
INFECTIOUS DISEASE PROGRESS NOTE  ID: Brian [name] is a 81 y.o. male with  Principal Problem:   Sepsis ([place]) Active Problems:   Hypertension goal BP (blood pressure) < 140/90   Hyperlipidemia LDL goal <100   Chronic kidney disease (CKD), stage III (moderate)   Anemia of chronic renal failure   Acute renal failure (ARF) (HCC)   Transaminitis   Diabetes mellitus, insulin dependent (IDDM), uncontrolled (HCC)   Leukocytosis   Acute kidney injury ([place])   SOB (shortness of breath)   Liver abscess   Fever and chills   Gram-negative bacteremia (HCC)  Subjective: C/o hunger.  BM x3 today  Abtx:  Anti-infectives    Start     Dose/Rate Route Frequency Ordered Stop   07/25/15 0000  piperacillin-tazobactam (ZOSYN) IVPB 3.375 g  Status:  Discontinued     3.375 g 12.5 mL/hr over 240 Minutes Intravenous 3 times per day 07/24/15 1705 07/24/15 1723   07/25/15 0000  piperacillin-tazobactam (ZOSYN) IVPB 3.375 g     3.375 g 12.5 mL/hr over 240 Minutes Intravenous Every 8 hours 07/24/15 1723     07/24/15 1715  piperacillin-tazobactam (ZOSYN) IVPB 3.375 g     3.375 g 100 mL/hr over 30 Minutes Intravenous  Once 07/24/15 1704 07/24/15 1838   07/21/15 1430  cefTRIAXone (ROCEPHIN) 2 g in dextrose 5 % 50 mL IVPB  Status:  Discontinued     2 g 100 mL/hr over 30 Minutes Intravenous Every 24 hours 07/20/15 1849 07/20/15 1850   07/21/15 1000  cefTRIAXone (ROCEPHIN) 2 g in dextrose 5 % 50 mL IVPB  Status:  Discontinued     2 g 100 mL/hr over 30 Minutes Intravenous Every 24 hours 07/20/15 1850 07/20/15 1855   07/20/15 1915  cefTAZidime (FORTAZ) 1 g in dextrose 5 % 50 mL IVPB  Status:  Discontinued     1 g 100 mL/hr over 30 Minutes Intravenous Every 12 hours 07/20/15 1909 07/24/15 1700   07/20/15 1645  metroNIDAZOLE (FLAGYL) IVPB 500 mg  Status:  Discontinued     500 mg 100 mL/hr over 60 Minutes Intravenous Every 8 hours 07/20/15 1630 07/24/15 1700   07/19/15 1430  doxycycline (VIBRAMYCIN) 100  mg in dextrose 5 % 250 mL IVPB  Status:  Discontinued     100 mg 125 mL/hr over 120 Minutes Intravenous Every 12 hours 07/19/15 1417 07/22/15 1034   07/19/15 1430  cefTRIAXone (ROCEPHIN) 1 g in dextrose 5 % 50 mL IVPB  Status:  Discontinued     1 g 100 mL/hr over 30 Minutes Intravenous Every 24 hours 07/19/15 1417 07/20/15 1849   07/18/15 2200  piperacillin-tazobactam (ZOSYN) IVPB 3.375 g  Status:  Discontinued     3.375 g 12.5 mL/hr over 240 Minutes Intravenous 3 times per day 07/18/15 1850 07/19/15 1415   07/18/15 1900  vancomycin (VANCOCIN) IVPB 750 mg/150 ml premix  Status:  Discontinued     750 mg 150 mL/hr over 60 Minutes Intravenous Every 24 hours 07/18/15 1850 07/20/15 1630   07/18/15 1300  cefTRIAXone (ROCEPHIN) 1 g in dextrose 5 % 50 mL IVPB  Status:  Discontinued     1 g 100 mL/hr over 30 Minutes Intravenous Every 24 hours 07/18/15 1241 07/18/15 1834      Medications:  Scheduled: . aspirin  81 mg Oral Daily  . enoxaparin (LOVENOX) injection  40 mg Subcutaneous Q24H  . finasteride  5 mg Oral Daily  . insulin aspart  0-15 Units Subcutaneous 6 times  per day  . insulin detemir  6 Units Subcutaneous Daily  . piperacillin-tazobactam (ZOSYN)  IV  3.375 g Intravenous Q8H  . sodium chloride  3 mL Intravenous Q12H  . tamsulosin  0.4 mg Oral Daily    Objective: Vital signs in last 24 hours: Temp:  [98.1 F (36.7 C)-99.1 F (37.3 C)] 98.1 F (36.7 C) (12/02 0844) Pulse Rate:  [96-101] 96 (12/02 0844) Resp:  [16-18] 18 (12/02 0844) BP: (140-155)/(78-84) 155/78 mmHg (12/02 0844) SpO2:  [99 %-100 %] 99 % (12/02 0844) Weight:  [83.4 kg (183 lb 13.8 oz)] 83.4 kg (183 lb 13.8 oz) (12/01 2001)   General appearance: alert, cooperative and mild distress Resp: clear to auscultation bilaterally Cardio: regular rate and rhythm GI: normal findings: bowel sounds normal and soft, non-tender and abnormal findings:  distended Incision/Wound: drain in RUQ. Bulb empty.   Lab  Results  Recent Labs  07/26/15 0602 07/27/15 0644  WBC 20.7* 22.3*  HGB 9.0* 9.4*  HCT 26.6* 28.3*  NA 151* 152*  K 3.8 3.5  CL 118* 118*  CO2 23 25  BUN 32* 27*  CREATININE 1.30* 1.31*   Liver Panel No results for input(s): PROT, ALBUMIN, AST, ALT, ALKPHOS, BILITOT, BILIDIR, IBILI in the last 72 hours. Sedimentation Rate No results for input(s): ESRSEDRATE in the last 72 hours. C-Reactive Protein No results for input(s): CRP in the last 72 hours.  Microbiology: Recent Results (from the past 240 hour(s))  Urine culture     Status: None   Collection Time: 07/18/15 12:03 PM  Result Value Ref Range Status   Specimen Description URINE, CLEAN CATCH  Final   Special Requests NONE  Final   Culture MULTIPLE SPECIES PRESENT, SUGGEST RECOLLECTION  Final   Report Status 07/19/2015 FINAL  Final  Culture, blood (routine x 2)     Status: None   Collection Time: 07/18/15 12:50 PM  Result Value Ref Range Status   Specimen Description BLOOD RIGHT ANTECUBITAL  Final   Special Requests BOTTLES DRAWN AEROBIC AND ANAEROBIC 10CC  Final   Culture  Setup Time   Final    GRAM NEGATIVE COCCOBACILLI ANAEROBIC BOTTLE ONLY CRITICAL RESULT CALLED TO, READ BACK BY AND VERIFIED WITH: A. DAVIS,RN AT 1157 ON 112516 BY S. YARBROUGH    Culture PREVOTELLA SPECIES BETA LACTAMASE NEGATIVE   Final   Report Status 07/25/2015 FINAL  Final  Culture, blood (routine x 2)     Status: None   Collection Time: 07/18/15 12:53 PM  Result Value Ref Range Status   Specimen Description BLOOD LEFT HAND  Final   Special Requests BOTTLES DRAWN AEROBIC ONLY 3CC  Final   Culture NO GROWTH 5 DAYS  Final   Report Status 07/23/2015 FINAL  Final  Culture, blood (routine x 2)     Status: None   Collection Time: 07/19/15  2:10 PM  Result Value Ref Range Status   Specimen Description BLOOD RIGHT ANTECUBITAL  Final   Special Requests BOTTLES DRAWN AEROBIC ONLY  10CC  Final   Culture NO GROWTH 5 DAYS  Final   Report Status  07/24/2015 FINAL  Final  Culture, blood (routine x 2)     Status: None   Collection Time: 07/19/15  2:18 PM  Result Value Ref Range Status   Specimen Description BLOOD RIGHT HAND  Final   Special Requests IN PEDIATRIC BOTTLE  3CC  Final   Culture NO GROWTH 5 DAYS  Final   Report Status 07/24/2015 FINAL  Final  MRSA PCR Screening     Status: None   Collection Time: 07/19/15  4:12 PM  Result Value Ref Range Status   MRSA by PCR NEGATIVE NEGATIVE Final    Comment:        The GeneXpert MRSA Assay (FDA approved for NASAL specimens only), is one component of a comprehensive MRSA colonization surveillance program. It is not intended to diagnose MRSA infection nor to guide or monitor treatment for MRSA infections.   Culture, routine-abscess     Status: None   Collection Time: 07/21/15  2:42 PM  Result Value Ref Range Status   Specimen Description ABSCESS LIVER  Final   Special Requests NONE  Final   Gram Stain   Final    ABUNDANT WBC PRESENT, PREDOMINANTLY PMN NO SQUAMOUS EPITHELIAL CELLS SEEN MODERATE GRAM POSITIVE COCCI IN PAIRS Performed at [facility]    Culture   Final    NO GROWTH 2 DAYS Performed at [facility]    Report Status 07/26/2015 FINAL  Final    Studies/Results: Dg Abd 1 View  07/27/2015  CLINICAL DATA:  ILEUS,NG TUBE.ABD DISCOMFORT EXAM: ABDOMEN - 1 VIEW COMPARISON:  07/26/2015 and CT 07/24/2015 FINDINGS: There is persistent significant dilatation of small bowel loops, with residual contrast in nondilated loops of colon. Colonic diverticula are present. There is no evidence for free intraperitoneal air on this supine view of the abdomen. A nasogastric tube is identified in the left upper quadrant overlying the region of the stomach. Liver abscess drain is partially imaged in the right upper quadrant. IMPRESSION: Pattern consistent with small bowel obstruction. Electronically Signed   By: Nolon Nations M.D.   On: 07/27/2015 08:20   Dg Abd 1  View  07/26/2015  CLINICAL DATA:  Ileus.  Abdominal discomfort. EXAM: ABDOMEN - 1 VIEW COMPARISON:  07/24/2015 FINDINGS: Distended small bowel loops continued of project over the abdomen. Abscess strain projects over the right upper quadrant. There is no obvious free intraperitoneal gas. There is contrast and gas in the colon. IMPRESSION: Stable ileus. Electronically Signed   By: Marybelle Killings M.D.   On: 07/26/2015 08:07     Assessment/Plan: Liver abscess GNR bacteremia (1/4 prevotella. Liver abscess Cx GPC on stain) Ileus arf  Total days of antibiotics: 8  Would continue zosyn Watch his temp and his WBC (up slightly)  Cr stable He had 1 BM yesterday, 3 today continue to watch this Repeat CT next week.  Defer moving drain, surgery to appropriate specialists. Updated family as to this.           Bobby Rumpf Infectious Diseases (pager) [phone] [email or web address] 07/27/2015, 11:55 AM  LOS: 9 days

## 2015-07-28 LAB — BASIC METABOLIC PANEL
ANION GAP: 9 (ref 5–15)
BUN: 26 mg/dL — AB (ref 6–20)
CO2: 24 mmol/L (ref 22–32)
Calcium: 8 mg/dL — ABNORMAL LOW (ref 8.9–10.3)
Chloride: 118 mmol/L — ABNORMAL HIGH (ref 101–111)
Creatinine, Ser: 1.21 mg/dL (ref 0.61–1.24)
GFR, EST NON AFRICAN AMERICAN: 55 mL/min — AB (ref >60)
Glucose, Bld: 101 mg/dL — ABNORMAL HIGH (ref 65–99)
POTASSIUM: 3.6 mmol/L (ref 3.5–5.1)
SODIUM: 151 mmol/L — AB (ref 135–145)

## 2015-07-28 LAB — CBC
HCT: 25.5 % — ABNORMAL LOW (ref 39.0–52.0)
Hemoglobin: 8.4 g/dL — ABNORMAL LOW (ref 13.0–17.0)
MCH: 28 pg (ref 26.0–34.0)
MCHC: 32.9 g/dL (ref 30.0–36.0)
MCV: 85 fL (ref 78.0–100.0)
Platelets: 502 10*3/uL — ABNORMAL HIGH (ref 150–400)
RBC: 3 MIL/uL — ABNORMAL LOW (ref 4.22–5.81)
RDW: 14.4 % (ref 11.5–15.5)
WBC: 21.9 10*3/uL — ABNORMAL HIGH (ref 4.0–10.5)

## 2015-07-28 LAB — GLUCOSE, CAPILLARY
GLUCOSE-CAPILLARY: 102 mg/dL — AB (ref 65–99)
GLUCOSE-CAPILLARY: 116 mg/dL — AB (ref 65–99)
GLUCOSE-CAPILLARY: 81 mg/dL (ref 65–99)
Glucose-Capillary: 141 mg/dL — ABNORMAL HIGH (ref 65–99)
Glucose-Capillary: 150 mg/dL — ABNORMAL HIGH (ref 65–99)
Glucose-Capillary: 120 mg/dL — ABNORMAL HIGH (ref 65–99)

## 2015-07-28 MED ORDER — CARVEDILOL 6.25 MG PO TABS
6.2500 mg | ORAL_TABLET | Freq: Two times a day (BID) | ORAL | Status: DC
  Administered 2015-07-28 – 2015-08-04 (×14): 6.25 mg via ORAL
  Filled 2015-07-28 (×13): qty 1

## 2015-07-28 MED ORDER — FUROSEMIDE 20 MG PO TABS
20.0000 mg | ORAL_TABLET | Freq: Every day | ORAL | Status: DC
  Administered 2015-07-28 – 2015-07-30 (×3): 20 mg via ORAL
  Filled 2015-07-28 (×3): qty 1

## 2015-07-28 NOTE — Progress Notes (Signed)
Patient ID: Brian [name], male   DOB: 06-30-1934, 81 y.o.   MRN: [id number]     [address] [place], [place] [address]    Phone: [phone] FAX: [phone]     Subjective: 2.71ml drainage reported.   Afebrile. WBC down.  Having BMs.  Objective:  Vital signs:  Filed Vitals:   07/27/15 1658 07/27/15 1956 07/28/15 0354 07/28/15 0848  BP: 148/81 152/84 150/80 130/78  Pulse: 99 111 104 108  Temp: 98.4 F (36.9 C) 99.8 F (37.7 C) 99.5 F (37.5 C) 98.3 F (36.8 C)  TempSrc: Oral   Oral  Resp: $Remo'18 19 18 18  'Wypdm$ Height:      Weight:      SpO2: 99% 97% 97% 99%    Last BM Date: 07/28/15  Intake/Output   Yesterday:  12/02 0701 - 12/03 0700 In: 1952.5 [I.V.:1787.5; IV Piggyback:150] Out: 640 [Urine:540; Emesis/NG output:100] This shift:  Total I/O In: 5 [Other:5] Out: 102.5 [Urine:100; Drains:2.5]  Physical Exam: General: Pt awake/alert/oriented x4 in no acute distress  Abdomen: Soft.  Nondistended. Non tender.  RUQ drain with serosanguinous output.    Problem List:   Principal Problem:   Sepsis ([place]) Active Problems:   Hypertension goal BP (blood pressure) < 140/90   Hyperlipidemia LDL goal <100   Chronic kidney disease (CKD), stage III (moderate)   Anemia of chronic renal failure   Acute renal failure (ARF) (HCC)   Transaminitis   Diabetes mellitus, insulin dependent (IDDM), uncontrolled (HCC)   Leukocytosis   Acute kidney injury ([place])   SOB (shortness of breath)   Liver abscess   Fever and chills   Gram-negative bacteremia (HCC)   Abdominal distention   Ileus (HCC)    Results:   Labs: Results for orders placed or performed during the hospital encounter of 07/18/15 (from the past 48 hour(s))  Glucose, capillary     Status: Abnormal   Collection Time: 07/26/15 11:45 AM  Result Value Ref Range   Glucose-Capillary 157 (H) 65 - 99 mg/dL   Comment 1 Notify RN    Comment 2 Document  in Chart   Glucose, capillary     Status: Abnormal   Collection Time: 07/26/15  4:05 PM  Result Value Ref Range   Glucose-Capillary 103 (H) 65 - 99 mg/dL  Glucose, capillary     Status: None   Collection Time: 07/26/15  7:59 PM  Result Value Ref Range   Glucose-Capillary 90 65 - 99 mg/dL  Glucose, capillary     Status: None   Collection Time: 07/26/15 11:59 PM  Result Value Ref Range   Glucose-Capillary 80 65 - 99 mg/dL  Glucose, capillary     Status: None   Collection Time: 07/27/15  4:01 AM  Result Value Ref Range   Glucose-Capillary 84 65 - 99 mg/dL  Basic metabolic panel     Status: Abnormal   Collection Time: 07/27/15  6:44 AM  Result Value Ref Range   Sodium 152 (H) 135 - 145 mmol/L   Potassium 3.5 3.5 - 5.1 mmol/L   Chloride 118 (H) 101 - 111 mmol/L   CO2 25 22 - 32 mmol/L   Glucose, Bld 121 (H) 65 - 99 mg/dL   BUN 27 (H) 6 - 20 mg/dL   Creatinine, Ser 1.31 (H) 0.61 - 1.24 mg/dL   Calcium 8.1 (L) 8.9 - 10.3 mg/dL   GFR calc non Af Amer 50 (  L) >60 mL/min   GFR calc Af Amer 58 (L) >60 mL/min    Comment: (NOTE) The eGFR has been calculated using the CKD EPI equation. This calculation has not been validated in all clinical situations. eGFR's persistently <60 mL/min signify possible Chronic Kidney Disease.    Anion gap 9 5 - 15  CBC     Status: Abnormal   Collection Time: 07/27/15  6:44 AM  Result Value Ref Range   WBC 22.3 (H) 4.0 - 10.5 K/uL   RBC 3.38 (L) 4.22 - 5.81 MIL/uL   Hemoglobin 9.4 (L) 13.0 - 17.0 g/dL   HCT 28.3 (L) 39.0 - 52.0 %   MCV 83.7 78.0 - 100.0 fL   MCH 27.8 26.0 - 34.0 pg   MCHC 33.2 30.0 - 36.0 g/dL   RDW 14.3 11.5 - 15.5 %   Platelets 552 (H) 150 - 400 K/uL  Glucose, capillary     Status: Abnormal   Collection Time: 07/27/15  7:50 AM  Result Value Ref Range   Glucose-Capillary 110 (H) 65 - 99 mg/dL  Glucose, capillary     Status: Abnormal   Collection Time: 07/27/15 11:59 AM  Result Value Ref Range   Glucose-Capillary 150 (H) 65 - 99  mg/dL  Glucose, capillary     Status: Abnormal   Collection Time: 07/27/15  4:50 PM  Result Value Ref Range   Glucose-Capillary 157 (H) 65 - 99 mg/dL  Occult blood card to lab, stool     Status: Abnormal   Collection Time: 07/27/15  7:08 PM  Result Value Ref Range   Fecal Occult Bld POSITIVE (A) NEGATIVE  Glucose, capillary     Status: Abnormal   Collection Time: 07/27/15  7:55 PM  Result Value Ref Range   Glucose-Capillary 102 (H) 65 - 99 mg/dL  Glucose, capillary     Status: None   Collection Time: 07/28/15 12:30 AM  Result Value Ref Range   Glucose-Capillary 81 65 - 99 mg/dL  Glucose, capillary     Status: Abnormal   Collection Time: 07/28/15  3:48 AM  Result Value Ref Range   Glucose-Capillary 102 (H) 65 - 99 mg/dL  CBC     Status: Abnormal   Collection Time: 07/28/15  3:49 AM  Result Value Ref Range   WBC 21.9 (H) 4.0 - 10.5 K/uL   RBC 3.00 (L) 4.22 - 5.81 MIL/uL   Hemoglobin 8.4 (L) 13.0 - 17.0 g/dL   HCT 25.5 (L) 39.0 - 52.0 %   MCV 85.0 78.0 - 100.0 fL   MCH 28.0 26.0 - 34.0 pg   MCHC 32.9 30.0 - 36.0 g/dL   RDW 14.4 11.5 - 15.5 %   Platelets 502 (H) 150 - 400 K/uL  Basic metabolic panel     Status: Abnormal   Collection Time: 07/28/15  3:49 AM  Result Value Ref Range   Sodium 151 (H) 135 - 145 mmol/L   Potassium 3.6 3.5 - 5.1 mmol/L   Chloride 118 (H) 101 - 111 mmol/L   CO2 24 22 - 32 mmol/L   Glucose, Bld 101 (H) 65 - 99 mg/dL   BUN 26 (H) 6 - 20 mg/dL   Creatinine, Ser 1.21 0.61 - 1.24 mg/dL   Calcium 8.0 (L) 8.9 - 10.3 mg/dL   GFR calc non Af Amer 55 (L) >60 mL/min   GFR calc Af Amer >60 >60 mL/min    Comment: (NOTE) The eGFR has been calculated using the CKD EPI equation. This  calculation has not been validated in all clinical situations. eGFR's persistently <60 mL/min signify possible Chronic Kidney Disease.    Anion gap 9 5 - 15  Glucose, capillary     Status: Abnormal   Collection Time: 07/28/15  7:54 AM  Result Value Ref Range    Glucose-Capillary 116 (H) 65 - 99 mg/dL    Imaging / Studies: Dg Abd 1 View  07/27/2015  CLINICAL DATA:  ILEUS,NG TUBE.ABD DISCOMFORT EXAM: ABDOMEN - 1 VIEW COMPARISON:  07/26/2015 and CT 07/24/2015 FINDINGS: There is persistent significant dilatation of small bowel loops, with residual contrast in nondilated loops of colon. Colonic diverticula are present. There is no evidence for free intraperitoneal air on this supine view of the abdomen. A nasogastric tube is identified in the left upper quadrant overlying the region of the stomach. Liver abscess drain is partially imaged in the right upper quadrant. IMPRESSION: Pattern consistent with small bowel obstruction. Electronically Signed   By: Nolon Nations M.D.   On: 07/27/2015 08:20    Medications / Allergies:  Scheduled Meds: . aspirin  81 mg Oral Daily  . enoxaparin (LOVENOX) injection  40 mg Subcutaneous Q24H  . finasteride  5 mg Oral Daily  . furosemide  20 mg Oral Daily  . insulin aspart  0-15 Units Subcutaneous 6 times per day  . insulin detemir  6 Units Subcutaneous Daily  . piperacillin-tazobactam (ZOSYN)  IV  3.375 g Intravenous Q8H  . sodium chloride  3 mL Intravenous Q12H  . tamsulosin  0.4 mg Oral Daily   Continuous Infusions:  PRN Meds:.acetaminophen, alum & mag hydroxide-simeth, bisacodyl, levalbuterol, morphine injection, ondansetron **OR** ondansetron (ZOFRAN) IV, oxyCODONE  Antibiotics: Anti-infectives    Start     Dose/Rate Route Frequency Ordered Stop   07/25/15 0000  piperacillin-tazobactam (ZOSYN) IVPB 3.375 g  Status:  Discontinued     3.375 g 12.5 mL/hr over 240 Minutes Intravenous 3 times per day 07/24/15 1705 07/24/15 1723   07/25/15 0000  piperacillin-tazobactam (ZOSYN) IVPB 3.375 g     3.375 g 12.5 mL/hr over 240 Minutes Intravenous Every 8 hours 07/24/15 1723     07/24/15 1715  piperacillin-tazobactam (ZOSYN) IVPB 3.375 g     3.375 g 100 mL/hr over 30 Minutes Intravenous  Once 07/24/15 1704 07/24/15  1838   07/21/15 1430  cefTRIAXone (ROCEPHIN) 2 g in dextrose 5 % 50 mL IVPB  Status:  Discontinued     2 g 100 mL/hr over 30 Minutes Intravenous Every 24 hours 07/20/15 1849 07/20/15 1850   07/21/15 1000  cefTRIAXone (ROCEPHIN) 2 g in dextrose 5 % 50 mL IVPB  Status:  Discontinued     2 g 100 mL/hr over 30 Minutes Intravenous Every 24 hours 07/20/15 1850 07/20/15 1855   07/20/15 1915  cefTAZidime (FORTAZ) 1 g in dextrose 5 % 50 mL IVPB  Status:  Discontinued     1 g 100 mL/hr over 30 Minutes Intravenous Every 12 hours 07/20/15 1909 07/24/15 1700   07/20/15 1645  metroNIDAZOLE (FLAGYL) IVPB 500 mg  Status:  Discontinued     500 mg 100 mL/hr over 60 Minutes Intravenous Every 8 hours 07/20/15 1630 07/24/15 1700   07/19/15 1430  doxycycline (VIBRAMYCIN) 100 mg in dextrose 5 % 250 mL IVPB  Status:  Discontinued     100 mg 125 mL/hr over 120 Minutes Intravenous Every 12 hours 07/19/15 1417 07/22/15 1034   07/19/15 1430  cefTRIAXone (ROCEPHIN) 1 g in dextrose 5 % 50 mL IVPB  Status:  Discontinued     1 g 100 mL/hr over 30 Minutes Intravenous Every 24 hours 07/19/15 1417 07/20/15 1849   07/18/15 2200  piperacillin-tazobactam (ZOSYN) IVPB 3.375 g  Status:  Discontinued     3.375 g 12.5 mL/hr over 240 Minutes Intravenous 3 times per day 07/18/15 1850 07/19/15 1415   07/18/15 1900  vancomycin (VANCOCIN) IVPB 750 mg/150 ml premix  Status:  Discontinued     750 mg 150 mL/hr over 60 Minutes Intravenous Every 24 hours 07/18/15 1850 07/20/15 1630   07/18/15 1300  cefTRIAXone (ROCEPHIN) 1 g in dextrose 5 % 50 mL IVPB  Status:  Discontinued     1 g 100 mL/hr over 30 Minutes Intravenous Every 24 hours 07/18/15 1241 07/18/15 1834        Assessment/Plan Right liver abscess with sepsis-etiology unclear, drain likely needs to be upsized, will leave that up to IR.  Still to early to repeat a CT scan.   Ileus-resolved, may advance diet and DC NGT [id number], GNR bacteremia, liver abscess   Erby Pian,  ANP-BC [facility]   07/28/2015 9:45 AM

## 2015-07-28 NOTE — Progress Notes (Signed)
Patient ambulated > 75 feet with rolling walker.  Patient tolerated well.  Left in bed, bed alarm set to middle sensitivity, and call bell within reach.

## 2015-07-28 NOTE — Progress Notes (Signed)
Patient ambulated > 75 feet with rolling walker. HR up to 141 with ambulation. Left patient sitting on the side of the bed for lunch, bed alarm set, and call bell within reach.

## 2015-07-28 NOTE — Progress Notes (Signed)
[facility] PROGRESS NOTE  Brian [name] [id number] DOB: Nov 11, 1933 DOA: 07/18/2015 PCP: [name], MD  Interim summary Brian [name] is a 81 year old gentleman with a past medical history of diabetes mellitus, admitted to the medicine service on 07/18/2015 when he presented with complaints of generalized weakness, dizziness, chills that was social with hyperglycemia. He reports having several episodes of nausea and vomiting earlier this week after having oysters. Initial lab work revealed acute on chronic kidney injury having creatinine of 2.85 with BUN of 36. He was found to be febrile having a temperature of 103 and started on broad-spectrum IV antimicrobial therapy with vancomycin and Zosyn. Patient also remained hypotensive overnight with systolic blood pressures in the 80s to 90s.workup revealed an abdominal ultrasoundwhich did not reveal acute intra-abdominal pathology. Lab work did reveal elevated transaminases with AST of 125 and ALT of 90.other workup included a chest x-ray and urinalysis which was unremarkable. Patient was transferred to the stepdown unit on 07/19/2015 for closer monitoring. CT scan of abdomen and pelvis performed on 07/20/2015 showed a hypoattenuating lesion within the liver. This was further worked up with an MRI of liver that showed findings compatible with abscess collection. Interventional radiology was consulted for drain placement that was performed on 07/21/2015. Blood cultures obtained on admission growing gram-negative bacilli, with repeat blood cultures show no growth to date. Cultures from abscess growing gram-positive cocci.  Awaiting organism identification and susceptibility testing.  Assessment/Plan: 1. Severe sepsis -improving -He had reported eating oysters past week after which he developed multiple episodes of nausea and vomiting that was associated with diarrhea. Vibrio parahaemolyticus was considered -On 07/20/2015: CT scan of abdomen  and pelvis performed revealing 7.1 x 5.0 x 5.7 cm hypoattenuating lesion within the liver. Radiology reporting that this could represent an abscess or perhaps primary hepatic malignancy. This was further worked up with MRI of liver that demonstrated findings compatible with abscess collection.    -On 07/21/2015 blood cultures drawn from 07/18/2015 growing 1/4 with Prevotella. Ceftriaxone changed to Fortaz 1 g IV every 12 hours and Vancomycin discontinued. Interventional radiology placed drain catheter of liver abscess on 11/26. Gram stain showing gram positive cocci in pairs from abscess, but culture no growth 2 days -Dr. Baxter Flattery of infectious disease, consulting -CT 11/29 without change in size of abscess, but too early to know, minimal output from drain in past 2days, IR following may need additional drain or upsizing of current drain, plan for repeat CT early next week -WBC unchanged remains on IV Zosyn -i think he will need a colonoscopy down the road, wife reports last one >5-10years ago -reportedly normal   2.  Ileus -Overnight 11/29, patient developed multiple episodes of nausea and vomiting and noted to have distended abdomen,  abdominal x-ray that showed adynamic ileus. -KUB without significant change despite having multiple bowel movements,  NG was clamped yesterday, without high residuals on recurrence of nausea or vomiting -Will start clears today,  -Appreciate [facility]CS consult   3.  Acute on chronic renal failure -Likely secondary to ATN in setting of hypotension or perhaps sepsis/critical illness -baseline between 1.4 1.7,on admit creatinine of 2.85 with BUN of 36 -stop IVF, starting to have some leg swelling in last 24h  4.  Elevated transaminases -due to abscesses most likely -S/P drainage of liver abscess on 07/21/2015.  -Labs showing improvement to elevated liver enzymes   5.  Elevated troponin. -Lab work showing upward trend in his troponin to 0.30, no chest pain. -related to  demand  ischemia in setting of critical illness, Troponins trending down.  6. Diabetes mellitus -cut down lantus, due to NPO, ileus  6.  History of hypertension. -He presented with hypotension for which antihypertensive medications were discontinued   DVT prophylaxis. Lovenox  Code Status: full code Family Communication: daughter at bedside Disposition Plan: pending improvement in ileus and abscesses   Antibiotics:  Vancomycin started on 07/18/2015 stopped on 07/20/2015  Zosyn discontinued on 07/19/2015  Ceftriaxone started on 07/19/2015 stopped on 07/20/2015   Doxycycline started on 07/19/2015   [name] started on 07/20/2015  HPI/Subjective: Had  3  BM again last night , feels ok, no vomiting  Objective: Filed Vitals:   07/28/15 0354 07/28/15 0848  BP: 150/80 130/78  Pulse: 104 108  Temp: 99.5 F (37.5 C) 98.3 F (36.8 C)  Resp: 18 18    Intake/Output Summary (Last 24 hours) at 07/28/15 1125 Last data filed at 07/28/15 0954  Gross per 24 hour  Intake 2228.75 ml  Output  642.5 ml  Net 1586.25 ml   Filed Weights   07/24/15 2034 07/25/15 2009 07/26/15 2001  Weight: 79 kg (174 lb 2.6 oz) 79.2 kg (174 lb 9.7 oz) 83.4 kg (183 lb 13.8 oz)    Exam:   General:  ill-appearing, AAOx3, no distress.   Cardiovascular: regular rate and rhythm normal S1-S2  Respiratory: CTAB  Abdomen: Abdomen is less distended, tympanic, non tender, diminished BS, Angie's clamped  Musculoskeletal:1-2+ ankle edema  Data Reviewed: Basic Metabolic Panel:  Recent Labs Lab 07/24/15 0522 07/25/15 0633 07/26/15 0602 07/27/15 0644 07/28/15 0349  NA 146* 149* 151* 152* 151*  K 3.6 3.8 3.8 3.5 3.6  CL 114* 114* 118* 118* 118*  CO2 22 25 23 25 24   GLUCOSE 260* 186* 131* 121* 101*  BUN 28* 32* 32* 27* 26*  CREATININE 1.32* 1.47* 1.30* 1.31* 1.21  CALCIUM 8.6* 8.4* 8.0* 8.1* 8.0*   Liver Function Tests:  Recent Labs Lab 07/22/15 0307 07/24/15 0522  AST 66* 27  ALT 90* 55   ALKPHOS 61 73  BILITOT 0.6 0.9  PROT 5.4* 5.8*  ALBUMIN 1.9* 2.0*   No results for input(s): LIPASE, AMYLASE in the last 168 hours. No results for input(s): AMMONIA in the last 168 hours. CBC:  Recent Labs Lab 07/24/15 0522 07/25/15 [id number] 07/26/15 0602 07/27/15 0644 07/28/15 0349  WBC 19.9* 20.8* 20.7* 22.3* 21.9*  NEUTROABS 17.1*  --   --   --   --   HGB 10.1* 10.1* 9.0* 9.4* 8.4*  HCT 28.2* 29.5* 26.6* 28.3* 25.5*  MCV 80.6 81.7 83.9 83.7 85.0  PLT 463* 562* 502* 552* 502*   Cardiac Enzymes: No results for input(s): CKTOTAL, CKMB, CKMBINDEX, TROPONINI in the last 168 hours. BNP (last 3 results) No results for input(s): BNP in the last 8760 hours.  ProBNP (last 3 results) No results for input(s): PROBNP in the last 8760 hours.  CBG:  Recent Labs Lab 07/27/15 1650 07/27/15 1955 07/28/15 0030 07/28/15 0348 07/28/15 0754  GLUCAP 157* 102* 81 102* 116*    Recent Results (from the past 240 hour(s))  Urine culture     Status: None   Collection Time: 07/18/15 12:03 PM  Result Value Ref Range Status   Specimen Description URINE, CLEAN CATCH  Final   Special Requests NONE  Final   Culture MULTIPLE SPECIES PRESENT, SUGGEST RECOLLECTION  Final   Report Status 07/19/2015 FINAL  Final  Culture, blood (routine x 2)     Status: None  Collection Time: 07/18/15 12:50 PM  Result Value Ref Range Status   Specimen Description BLOOD RIGHT ANTECUBITAL  Final   Special Requests BOTTLES DRAWN AEROBIC AND ANAEROBIC 10CC  Final   Culture  Setup Time   Final    GRAM NEGATIVE COCCOBACILLI ANAEROBIC BOTTLE ONLY CRITICAL RESULT CALLED TO, READ BACK BY AND VERIFIED WITH: A. DAVIS,RN AT 1157 ON 112516 BY S. YARBROUGH    Culture PREVOTELLA SPECIES BETA LACTAMASE NEGATIVE   Final   Report Status 07/25/2015 FINAL  Final  Culture, blood (routine x 2)     Status: None   Collection Time: 07/18/15 12:53 PM  Result Value Ref Range Status   Specimen Description BLOOD LEFT HAND  Final    Special Requests BOTTLES DRAWN AEROBIC ONLY 3CC  Final   Culture NO GROWTH 5 DAYS  Final   Report Status 07/23/2015 FINAL  Final  Culture, blood (routine x 2)     Status: None   Collection Time: 07/19/15  2:10 PM  Result Value Ref Range Status   Specimen Description BLOOD RIGHT ANTECUBITAL  Final   Special Requests BOTTLES DRAWN AEROBIC ONLY  10CC  Final   Culture NO GROWTH 5 DAYS  Final   Report Status 07/24/2015 FINAL  Final  Culture, blood (routine x 2)     Status: None   Collection Time: 07/19/15  2:18 PM  Result Value Ref Range Status   Specimen Description BLOOD RIGHT HAND  Final   Special Requests IN PEDIATRIC BOTTLE  3CC  Final   Culture NO GROWTH 5 DAYS  Final   Report Status 07/24/2015 FINAL  Final  MRSA PCR Screening     Status: None   Collection Time: 07/19/15  4:12 PM  Result Value Ref Range Status   MRSA by PCR NEGATIVE NEGATIVE Final    Comment:        The GeneXpert MRSA Assay (FDA approved for NASAL specimens only), is one component of a comprehensive MRSA colonization surveillance program. It is not intended to diagnose MRSA infection nor to guide or monitor treatment for MRSA infections.   Culture, routine-abscess     Status: None   Collection Time: 07/21/15  2:42 PM  Result Value Ref Range Status   Specimen Description ABSCESS LIVER  Final   Special Requests NONE  Final   Gram Stain   Final    ABUNDANT WBC PRESENT, PREDOMINANTLY PMN NO SQUAMOUS EPITHELIAL CELLS SEEN MODERATE GRAM POSITIVE COCCI IN PAIRS Performed at [facility]    Culture   Final    NO GROWTH 2 DAYS Performed at [facility]    Report Status 07/26/2015 FINAL  Final     Studies: Dg Abd 1 View  07/27/2015  CLINICAL DATA:  ILEUS,NG TUBE.ABD DISCOMFORT EXAM: ABDOMEN - 1 VIEW COMPARISON:  07/26/2015 and CT 07/24/2015 FINDINGS: There is persistent significant dilatation of small bowel loops, with residual contrast in nondilated loops of colon. Colonic diverticula are  present. There is no evidence for free intraperitoneal air on this supine view of the abdomen. A nasogastric tube is identified in the left upper quadrant overlying the region of the stomach. Liver abscess drain is partially imaged in the right upper quadrant. IMPRESSION: Pattern consistent with small bowel obstruction. Electronically Signed   By: Nolon Nations M.D.   On: 07/27/2015 08:20    Scheduled Meds: . aspirin  81 mg Oral Daily  . enoxaparin (LOVENOX) injection  40 mg Subcutaneous Q24H  . finasteride  5 mg  Oral Daily  . furosemide  20 mg Oral Daily  . insulin aspart  0-15 Units Subcutaneous 6 times per day  . insulin detemir  6 Units Subcutaneous Daily  . piperacillin-tazobactam (ZOSYN)  IV  3.375 g Intravenous Q8H  . sodium chloride  3 mL Intravenous Q12H  . tamsulosin  0.4 mg Oral Daily   Continuous Infusions:    Principal Problem:   Sepsis ([place]) Active Problems:   Hypertension goal BP (blood pressure) < 140/90   Hyperlipidemia LDL goal <100   Chronic kidney disease (CKD), stage III (moderate)   Anemia of chronic renal failure   Acute renal failure (ARF) (HCC)   Transaminitis   Diabetes mellitus, insulin dependent (IDDM), uncontrolled (HCC)   Leukocytosis   Acute kidney injury ([place])   SOB (shortness of breath)   Liver abscess   Fever and chills   Gram-negative bacteremia ([employer])   Abdominal distention   Ileus ([place])    Time spent: 35 min    Felipa Laroche  [facility] Pager [phone] If 7PM-7AM, please contact night-coverage at [email or web address], password [facility] 07/28/2015, 11:25 AM  LOS: 10 days

## 2015-07-28 NOTE — Progress Notes (Signed)
HR up to 141 while ambulating.  MD notified. Will continue to monitor.

## 2015-07-28 NOTE — Progress Notes (Signed)
NG tube removed per MD order. Patient tolerated well.

## 2015-07-29 LAB — BASIC METABOLIC PANEL
ANION GAP: 9 (ref 5–15)
BUN: 26 mg/dL — ABNORMAL HIGH (ref 6–20)
CHLORIDE: 117 mmol/L — AB (ref 101–111)
CO2: 25 mmol/L (ref 22–32)
Calcium: 8.2 mg/dL — ABNORMAL LOW (ref 8.9–10.3)
Creatinine, Ser: 1.28 mg/dL — ABNORMAL HIGH (ref 0.61–1.24)
GFR calc Af Amer: 60 mL/min — ABNORMAL LOW (ref >60)
GFR, EST NON AFRICAN AMERICAN: 52 mL/min — AB (ref >60)
Glucose, Bld: 110 mg/dL — ABNORMAL HIGH (ref 65–99)
POTASSIUM: 3.8 mmol/L (ref 3.5–5.1)
SODIUM: 151 mmol/L — AB (ref 135–145)

## 2015-07-29 LAB — GLUCOSE, CAPILLARY
GLUCOSE-CAPILLARY: 103 mg/dL — AB (ref 65–99)
GLUCOSE-CAPILLARY: 113 mg/dL — AB (ref 65–99)
GLUCOSE-CAPILLARY: 125 mg/dL — AB (ref 65–99)
GLUCOSE-CAPILLARY: 126 mg/dL — AB (ref 65–99)
Glucose-Capillary: 104 mg/dL — ABNORMAL HIGH (ref 65–99)
Glucose-Capillary: 115 mg/dL — ABNORMAL HIGH (ref 65–99)

## 2015-07-29 LAB — CBC
HCT: 25 % — ABNORMAL LOW (ref 39.0–52.0)
HEMOGLOBIN: 8.2 g/dL — AB (ref 13.0–17.0)
MCH: 28.1 pg (ref 26.0–34.0)
MCHC: 32.8 g/dL (ref 30.0–36.0)
MCV: 85.6 fL (ref 78.0–100.0)
PLATELETS: 489 10*3/uL — AB (ref 150–400)
RBC: 2.92 MIL/uL — AB (ref 4.22–5.81)
RDW: 14.6 % (ref 11.5–15.5)
WBC: 22.3 10*3/uL — AB (ref 4.0–10.5)

## 2015-07-29 MED ORDER — INSULIN ASPART 100 UNIT/ML ~~LOC~~ SOLN
0.0000 [IU] | Freq: Three times a day (TID) | SUBCUTANEOUS | Status: DC
  Administered 2015-07-30 – 2015-08-02 (×3): 2 [IU] via SUBCUTANEOUS
  Administered 2015-08-02: 3 [IU] via SUBCUTANEOUS
  Administered 2015-08-03 – 2015-08-04 (×3): 2 [IU] via SUBCUTANEOUS

## 2015-07-29 MED ORDER — INSULIN ASPART 100 UNIT/ML ~~LOC~~ SOLN
0.0000 [IU] | Freq: Every day | SUBCUTANEOUS | Status: DC
  Administered 2015-07-30: 0 [IU] via SUBCUTANEOUS

## 2015-07-29 NOTE — Progress Notes (Signed)
INFECTIOUS DISEASE PROGRESS NOTE  ID: DANARI OLUND is a 81 y.o. male with  Principal Problem:   Sepsis ([place]) Active Problems:   Hypertension goal BP (blood pressure) < 140/90   Hyperlipidemia LDL goal <100   Chronic kidney disease (CKD), stage III (moderate)   Anemia of chronic renal failure   Acute renal failure (ARF) (HCC)   Transaminitis   Diabetes mellitus, insulin dependent (IDDM), uncontrolled (HCC)   Leukocytosis   Acute kidney injury ([place])   SOB (shortness of breath)   Liver abscess   Fever and chills   Gram-negative bacteremia (HCC)   Abdominal distention   Ileus (HCC)  Subjective: Without complaints  Abtx:  Anti-infectives    Start     Dose/Rate Route Frequency Ordered Stop   07/25/15 0000  piperacillin-tazobactam (ZOSYN) IVPB 3.375 g  Status:  Discontinued     3.375 g 12.5 mL/hr over 240 Minutes Intravenous 3 times per day 07/24/15 1705 07/24/15 1723   07/25/15 0000  piperacillin-tazobactam (ZOSYN) IVPB 3.375 g     3.375 g 12.5 mL/hr over 240 Minutes Intravenous Every 8 hours 07/24/15 1723     07/24/15 1715  piperacillin-tazobactam (ZOSYN) IVPB 3.375 g     3.375 g 100 mL/hr over 30 Minutes Intravenous  Once 07/24/15 1704 07/24/15 1838   07/21/15 1430  cefTRIAXone (ROCEPHIN) 2 g in dextrose 5 % 50 mL IVPB  Status:  Discontinued     2 g 100 mL/hr over 30 Minutes Intravenous Every 24 hours 07/20/15 1849 07/20/15 1850   07/21/15 1000  cefTRIAXone (ROCEPHIN) 2 g in dextrose 5 % 50 mL IVPB  Status:  Discontinued     2 g 100 mL/hr over 30 Minutes Intravenous Every 24 hours 07/20/15 1850 07/20/15 1855   07/20/15 1915  cefTAZidime (FORTAZ) 1 g in dextrose 5 % 50 mL IVPB  Status:  Discontinued     1 g 100 mL/hr over 30 Minutes Intravenous Every 12 hours 07/20/15 1909 07/24/15 1700   07/20/15 1645  metroNIDAZOLE (FLAGYL) IVPB 500 mg  Status:  Discontinued     500 mg 100 mL/hr over 60 Minutes Intravenous Every 8 hours 07/20/15 1630 07/24/15 1700   07/19/15  1430  doxycycline (VIBRAMYCIN) 100 mg in dextrose 5 % 250 mL IVPB  Status:  Discontinued     100 mg 125 mL/hr over 120 Minutes Intravenous Every 12 hours 07/19/15 1417 07/22/15 1034   07/19/15 1430  cefTRIAXone (ROCEPHIN) 1 g in dextrose 5 % 50 mL IVPB  Status:  Discontinued     1 g 100 mL/hr over 30 Minutes Intravenous Every 24 hours 07/19/15 1417 07/20/15 1849   07/18/15 2200  piperacillin-tazobactam (ZOSYN) IVPB 3.375 g  Status:  Discontinued     3.375 g 12.5 mL/hr over 240 Minutes Intravenous 3 times per day 07/18/15 1850 07/19/15 1415   07/18/15 1900  vancomycin (VANCOCIN) IVPB 750 mg/150 ml premix  Status:  Discontinued     750 mg 150 mL/hr over 60 Minutes Intravenous Every 24 hours 07/18/15 1850 07/20/15 1630   07/18/15 1300  cefTRIAXone (ROCEPHIN) 1 g in dextrose 5 % 50 mL IVPB  Status:  Discontinued     1 g 100 mL/hr over 30 Minutes Intravenous Every 24 hours 07/18/15 1241 07/18/15 1834      Medications:  Scheduled: . aspirin  81 mg Oral Daily  . carvedilol  6.25 mg Oral BID WC  . enoxaparin (LOVENOX) injection  40 mg Subcutaneous Q24H  . finasteride  5  mg Oral Daily  . furosemide  20 mg Oral Daily  . insulin aspart  0-15 Units Subcutaneous 6 times per day  . insulin detemir  6 Units Subcutaneous Daily  . piperacillin-tazobactam (ZOSYN)  IV  3.375 g Intravenous Q8H  . sodium chloride  3 mL Intravenous Q12H  . tamsulosin  0.4 mg Oral Daily    Objective: Vital signs in last 24 hours: Temp:  [98 F (36.7 C)-99.7 F (37.6 C)] 98.3 F (36.8 C) (12/04 0803) Pulse Rate:  [99-104] 100 (12/04 0803) Resp:  [17-18] 18 (12/04 0803) BP: (133-151)/(71-81) 144/81 mmHg (12/04 0803) SpO2:  [98 %-100 %] 98 % (12/04 0803) Weight:  [78.1 kg (172 lb 2.9 oz)] 78.1 kg (172 lb 2.9 oz) (12/03 2217)   General appearance: alert, cooperative and no distress GI: normal findings: bowel sounds normal and soft, non-tender  Lab Results  Recent Labs  07/28/15 0349 07/29/15 0629  WBC  21.9* 22.3*  HGB 8.4* 8.2*  HCT 25.5* 25.0*  NA 151* 151*  K 3.6 3.8  CL 118* 117*  CO2 24 25  BUN 26* 26*  CREATININE 1.21 1.28*   Liver Panel No results for input(s): PROT, ALBUMIN, AST, ALT, ALKPHOS, BILITOT, BILIDIR, IBILI in the last 72 hours. Sedimentation Rate No results for input(s): ESRSEDRATE in the last 72 hours. C-Reactive Protein No results for input(s): CRP in the last 72 hours.  Microbiology: Recent Results (from the past 240 hour(s))  Culture, routine-abscess     Status: None   Collection Time: 07/21/15  2:42 PM  Result Value Ref Range Status   Specimen Description ABSCESS LIVER  Final   Special Requests NONE  Final   Gram Stain   Final    ABUNDANT WBC PRESENT, PREDOMINANTLY PMN NO SQUAMOUS EPITHELIAL CELLS SEEN MODERATE GRAM POSITIVE COCCI IN PAIRS Performed at [facility]    Culture   Final    NO GROWTH 2 DAYS Performed at [facility]    Report Status 07/26/2015 FINAL  Final    Studies/Results: No results found.   Assessment/Plan: Liver abscess GNR bacteremia (1/4 prevotella. Liver abscess Cx GPC on stain) Ileus arf  Total days of antibiotics: 9  Would continue zosyn afeb and his WBC (up slightly)  Cr stable 10 cc drain out in last 24h possible repeat imaging soon Defer moving drain, surgery to appropriate specialists.         Bobby Rumpf Infectious Diseases (pager) [phone] [email or web address] 07/29/2015, 4:32 PM  LOS: 11 days

## 2015-07-29 NOTE — Progress Notes (Signed)
Patient ID: Brian [name], male   DOB: 04/24/1934, 81 y.o.   MRN: [id number]     [address] [place], [place] [address]    Phone: [phone] FAX: [phone]     Subjective: 3 BMs last night.  Denies abdominal pain.  Afebrile. WBC up a bit, but insignificant.   Objective:  Vital signs:  Filed Vitals:   07/28/15 2115 07/28/15 2217 07/29/15 0403 07/29/15 0803  BP: 136/76  151/71 144/81  Pulse: 104  101 100  Temp: 99.1 F (37.3 C)  99.7 F (37.6 C) 98.3 F (36.8 C)  TempSrc: Oral   Oral  Resp: $Remo'18  17 18  'bqTxh$ Height:      Weight:  78.1 kg (172 lb 2.9 oz)    SpO2: 100%  98% 98%    Last BM Date: 07/28/15  Intake/Output   Yesterday:  12/03 0701 - 12/04 0700 In: 886.3 [P.O.:600; I.V.:271.3] Out: 1160.5 [Urine:1150; Drains:10.5] This shift:  Total I/O In: 240 [P.O.:240] Out: 250 [Urine:250]   Physical Exam: General: Pt awake/alert/oriented x4 in no acute distress  Abdomen: Soft. Nondistended. Non tender. RUQ drain with serosanguinous output.     Problem List:   Principal Problem:   Sepsis ([place]) Active Problems:   Hypertension goal BP (blood pressure) < 140/90   Hyperlipidemia LDL goal <100   Chronic kidney disease (CKD), stage III (moderate)   Anemia of chronic renal failure   Acute renal failure (ARF) (HCC)   Transaminitis   Diabetes mellitus, insulin dependent (IDDM), uncontrolled (HCC)   Leukocytosis   Acute kidney injury ([place])   SOB (shortness of breath)   Liver abscess   Fever and chills   Gram-negative bacteremia (HCC)   Abdominal distention   Ileus (HCC)    Results:   Labs: Results for orders placed or performed during the hospital encounter of 07/18/15 (from the past 48 hour(s))  Glucose, capillary     Status: Abnormal   Collection Time: 07/27/15 11:59 AM  Result Value Ref Range   Glucose-Capillary 150 (H) 65 - 99 mg/dL  Glucose, capillary     Status: Abnormal    Collection Time: 07/27/15  4:50 PM  Result Value Ref Range   Glucose-Capillary 157 (H) 65 - 99 mg/dL  Occult blood card to lab, stool     Status: Abnormal   Collection Time: 07/27/15  7:08 PM  Result Value Ref Range   Fecal Occult Bld POSITIVE (A) NEGATIVE  Glucose, capillary     Status: Abnormal   Collection Time: 07/27/15  7:55 PM  Result Value Ref Range   Glucose-Capillary 102 (H) 65 - 99 mg/dL  Glucose, capillary     Status: None   Collection Time: 07/28/15 12:30 AM  Result Value Ref Range   Glucose-Capillary 81 65 - 99 mg/dL  Glucose, capillary     Status: Abnormal   Collection Time: 07/28/15  3:48 AM  Result Value Ref Range   Glucose-Capillary 102 (H) 65 - 99 mg/dL  CBC     Status: Abnormal   Collection Time: 07/28/15  3:49 AM  Result Value Ref Range   WBC 21.9 (H) 4.0 - 10.5 K/uL   RBC 3.00 (L) 4.22 - 5.81 MIL/uL   Hemoglobin 8.4 (L) 13.0 - 17.0 g/dL   HCT 25.5 (L) 39.0 - 52.0 %   MCV 85.0 78.0 - 100.0 fL   MCH 28.0 26.0 - 34.0 pg   MCHC 32.9  30.0 - 36.0 g/dL   RDW 14.4 11.5 - 15.5 %   Platelets 502 (H) 150 - 400 K/uL  Basic metabolic panel     Status: Abnormal   Collection Time: 07/28/15  3:49 AM  Result Value Ref Range   Sodium 151 (H) 135 - 145 mmol/L   Potassium 3.6 3.5 - 5.1 mmol/L   Chloride 118 (H) 101 - 111 mmol/L   CO2 24 22 - 32 mmol/L   Glucose, Bld 101 (H) 65 - 99 mg/dL   BUN 26 (H) 6 - 20 mg/dL   Creatinine, Ser 1.21 0.61 - 1.24 mg/dL   Calcium 8.0 (L) 8.9 - 10.3 mg/dL   GFR calc non Af Amer 55 (L) >60 mL/min   GFR calc Af Amer >60 >60 mL/min    Comment: (NOTE) The eGFR has been calculated using the CKD EPI equation. This calculation has not been validated in all clinical situations. eGFR's persistently <60 mL/min signify possible Chronic Kidney Disease.    Anion gap 9 5 - 15  Glucose, capillary     Status: Abnormal   Collection Time: 07/28/15  7:54 AM  Result Value Ref Range   Glucose-Capillary 116 (H) 65 - 99 mg/dL  Glucose, capillary      Status: Abnormal   Collection Time: 07/28/15 12:24 PM  Result Value Ref Range   Glucose-Capillary 141 (H) 65 - 99 mg/dL  Glucose, capillary     Status: Abnormal   Collection Time: 07/28/15  4:38 PM  Result Value Ref Range   Glucose-Capillary 150 (H) 65 - 99 mg/dL  Glucose, capillary     Status: Abnormal   Collection Time: 07/28/15 10:05 PM  Result Value Ref Range   Glucose-Capillary 120 (H) 65 - 99 mg/dL  Glucose, capillary     Status: Abnormal   Collection Time: 07/29/15 12:08 AM  Result Value Ref Range   Glucose-Capillary 103 (H) 65 - 99 mg/dL  Glucose, capillary     Status: Abnormal   Collection Time: 07/29/15  4:01 AM  Result Value Ref Range   Glucose-Capillary 104 (H) 65 - 99 mg/dL  CBC     Status: Abnormal   Collection Time: 07/29/15  6:29 AM  Result Value Ref Range   WBC 22.3 (H) 4.0 - 10.5 K/uL   RBC 2.92 (L) 4.22 - 5.81 MIL/uL   Hemoglobin 8.2 (L) 13.0 - 17.0 g/dL   HCT 25.0 (L) 39.0 - 52.0 %   MCV 85.6 78.0 - 100.0 fL   MCH 28.1 26.0 - 34.0 pg   MCHC 32.8 30.0 - 36.0 g/dL   RDW 14.6 11.5 - 15.5 %   Platelets 489 (H) 150 - 400 K/uL  Basic metabolic panel     Status: Abnormal   Collection Time: 07/29/15  6:29 AM  Result Value Ref Range   Sodium 151 (H) 135 - 145 mmol/L   Potassium 3.8 3.5 - 5.1 mmol/L   Chloride 117 (H) 101 - 111 mmol/L   CO2 25 22 - 32 mmol/L   Glucose, Bld 110 (H) 65 - 99 mg/dL   BUN 26 (H) 6 - 20 mg/dL   Creatinine, Ser 1.28 (H) 0.61 - 1.24 mg/dL   Calcium 8.2 (L) 8.9 - 10.3 mg/dL   GFR calc non Af Amer 52 (L) >60 mL/min   GFR calc Af Amer 60 (L) >60 mL/min    Comment: (NOTE) The eGFR has been calculated using the CKD EPI equation. This calculation has not been validated in all clinical  situations. eGFR's persistently <60 mL/min signify possible Chronic Kidney Disease.    Anion gap 9 5 - 15  Glucose, capillary     Status: Abnormal   Collection Time: 07/29/15  8:02 AM  Result Value Ref Range   Glucose-Capillary 126 (H) 65 - 99 mg/dL     Imaging / Studies: No results found.  Medications / Allergies:  Scheduled Meds: . aspirin  81 mg Oral Daily  . carvedilol  6.25 mg Oral BID WC  . enoxaparin (LOVENOX) injection  40 mg Subcutaneous Q24H  . finasteride  5 mg Oral Daily  . furosemide  20 mg Oral Daily  . insulin aspart  0-15 Units Subcutaneous 6 times per day  . insulin detemir  6 Units Subcutaneous Daily  . piperacillin-tazobactam (ZOSYN)  IV  3.375 g Intravenous Q8H  . sodium chloride  3 mL Intravenous Q12H  . tamsulosin  0.4 mg Oral Daily   Continuous Infusions:  PRN Meds:.acetaminophen, alum & mag hydroxide-simeth, bisacodyl, levalbuterol, morphine injection, ondansetron **OR** ondansetron (ZOFRAN) IV, oxyCODONE  Antibiotics: Anti-infectives    Start     Dose/Rate Route Frequency Ordered Stop   07/25/15 0000  piperacillin-tazobactam (ZOSYN) IVPB 3.375 g  Status:  Discontinued     3.375 g 12.5 mL/hr over 240 Minutes Intravenous 3 times per day 07/24/15 1705 07/24/15 1723   07/25/15 0000  piperacillin-tazobactam (ZOSYN) IVPB 3.375 g     3.375 g 12.5 mL/hr over 240 Minutes Intravenous Every 8 hours 07/24/15 1723     07/24/15 1715  piperacillin-tazobactam (ZOSYN) IVPB 3.375 g     3.375 g 100 mL/hr over 30 Minutes Intravenous  Once 07/24/15 1704 07/24/15 1838   07/21/15 1430  cefTRIAXone (ROCEPHIN) 2 g in dextrose 5 % 50 mL IVPB  Status:  Discontinued     2 g 100 mL/hr over 30 Minutes Intravenous Every 24 hours 07/20/15 1849 07/20/15 1850   07/21/15 1000  cefTRIAXone (ROCEPHIN) 2 g in dextrose 5 % 50 mL IVPB  Status:  Discontinued     2 g 100 mL/hr over 30 Minutes Intravenous Every 24 hours 07/20/15 1850 07/20/15 1855   07/20/15 1915  cefTAZidime (FORTAZ) 1 g in dextrose 5 % 50 mL IVPB  Status:  Discontinued     1 g 100 mL/hr over 30 Minutes Intravenous Every 12 hours 07/20/15 1909 07/24/15 1700   07/20/15 1645  metroNIDAZOLE (FLAGYL) IVPB 500 mg  Status:  Discontinued     500 mg 100 mL/hr over 60 Minutes  Intravenous Every 8 hours 07/20/15 1630 07/24/15 1700   07/19/15 1430  doxycycline (VIBRAMYCIN) 100 mg in dextrose 5 % 250 mL IVPB  Status:  Discontinued     100 mg 125 mL/hr over 120 Minutes Intravenous Every 12 hours 07/19/15 1417 07/22/15 1034   07/19/15 1430  cefTRIAXone (ROCEPHIN) 1 g in dextrose 5 % 50 mL IVPB  Status:  Discontinued     1 g 100 mL/hr over 30 Minutes Intravenous Every 24 hours 07/19/15 1417 07/20/15 1849   07/18/15 2200  piperacillin-tazobactam (ZOSYN) IVPB 3.375 g  Status:  Discontinued     3.375 g 12.5 mL/hr over 240 Minutes Intravenous 3 times per day 07/18/15 1850 07/19/15 1415   07/18/15 1900  vancomycin (VANCOCIN) IVPB 750 mg/150 ml premix  Status:  Discontinued     750 mg 150 mL/hr over 60 Minutes Intravenous Every 24 hours 07/18/15 1850 07/20/15 1630   07/18/15 1300  cefTRIAXone (ROCEPHIN) 1 g in dextrose 5 % 50 mL IVPB  Status:  Discontinued     1 g 100 mL/hr over 30 Minutes Intravenous Every 24 hours 07/18/15 1241 07/18/15 1834       Assessment/Plan Right liver abscess with sepsis-etiology unclear, drain without any significant output(serous, probably from flushes).  Plan for CT in AM Ileus-advance to carb modiifed diet.  ID-zosyn, GNR bacteremia, liver abscess  Erby Pian, ANP-BC [facility]   07/29/2015 9:05 AM

## 2015-07-29 NOTE — Progress Notes (Signed)
[facility] PROGRESS NOTE  NIHAAN BRILLA [id number] DOB: [birth year] DOA: 07/18/2015 PCP: [name], MD  Interim summary Mr. Kelsch is a 81 year old gentleman with a past medical history of diabetes mellitus, admitted to the medicine service on 07/18/2015 when he presented with complaints of generalized weakness, dizziness, chills that was social with hyperglycemia. He reports having several episodes of nausea and vomiting earlier this week after having oysters. Initial lab work revealed acute on chronic kidney injury having creatinine of 2.85 with BUN of 36. He was found to be febrile having a temperature of 103 and started on broad-spectrum IV antimicrobial therapy with vancomycin and Zosyn. Patient also remained hypotensive overnight with systolic blood pressures in the 80s to 90s.workup revealed an abdominal ultrasoundwhich did not reveal acute intra-abdominal pathology. Lab work did reveal elevated transaminases with AST of 125 and ALT of 90.other workup included a chest x-ray and urinalysis which was unremarkable. Patient was transferred to the stepdown unit on 07/19/2015 for closer monitoring. CT scan of abdomen and pelvis performed on 07/20/2015 showed a hypoattenuating lesion within the liver. This was further worked up with an MRI of liver that showed findings compatible with abscess collection. Interventional radiology was consulted for drain placement that was performed on 07/21/2015. Blood cultures obtained on admission growing gram-negative bacilli, with repeat blood cultures show no growth to date. Cultures from abscess growing gram-positive cocci.  Awaiting organism identification and susceptibility testing.  Assessment/Plan: 1. Severe sepsis/Liver abscess -On 07/20/2015: CT scan of abdomen and pelvis performed revealing 7.1 x 5.0 x 5.7 cm hypoattenuating lesion within the liver. Radiology reporting that this could represent an abscess vs primary hepatic malignancy. This  was further worked up with MRI of liver that demonstrated findings compatible with abscess collection.    -On 07/21/2015 blood cultures drawn from 07/18/2015 growing 1/4 with Prevotella. Ceftriaxone changed to Fortaz 1 g IV every 12 hours and Vancomycin discontinued. -11/26  Interventional radiology placed drain catheter of liver abscess . Gram stain showing gram positive cocci in pairs from abscess, but culture no growth 2 days -Dr. Baxter Flattery of infectious disease, consulting -CT 11/29 without change in size of abscess, but too early to know, minimal output from drain in past 2days, IR following may need additional drain or upsizing of current drain, plan for repeat CT tomorrow -WBC unchanged remains on IV Zosyn -i think he will need a colonoscopy down the road, wife reports last one >5-10years ago -reportedly normal   2.  Ileus -Overnight 11/29, patient developed multiple episodes of nausea and vomiting and noted to have distended abdomen,  abdominal x-ray that showed adynamic ileus. -s/p NG decompression, improving -tolerating clears, NG out, advance diet  3.  Acute on chronic renal failure -Likely secondary to ATN in setting of hypotension or perhaps sepsis/critical illness -baseline between 1.4 1.7,on admit creatinine of 2.85 with BUN of 36 -stopped IVF, starting to have some leg swelling in last 24h  4.  Elevated transaminases -due to abscesses most likely -S/P drainage of liver abscess on 07/21/2015.  -Labs showing improvement to elevated liver enzymes   5.  Elevated troponin. -Lab work showing upward trend in his troponin to 0.30, no chest pain. -related to demand ischemia in setting of critical illness, Troponins trending down.  6. Diabetes mellitus -continue lantus, increase dose tomorrow as diet advanced  6.  History of hypertension. -He presented with hypotension for which antihypertensive medications were discontinued   DVT prophylaxis. Lovenox  Code Status: full  code Family Communication: daughter  at bedside Disposition Plan: pending improvement in ileus and abscesses   Antibiotics:  Vancomycin started on 07/18/2015 stopped on 07/20/2015  Zosyn discontinued on 07/19/2015  Ceftriaxone started on 07/19/2015 stopped on 07/20/2015   Doxycycline started on 07/19/2015   [name] started on 07/20/2015  HPI/Subjective: Had  3  BM again last night , feels ok, no vomiting, dyspnea with activity  Objective: Filed Vitals:   07/29/15 0403 07/29/15 0803  BP: 151/71 144/81  Pulse: 101 100  Temp: 99.7 F (37.6 C) 98.3 F (36.8 C)  Resp: 17 18    Intake/Output Summary (Last 24 hours) at 07/29/15 1313 Last data filed at 07/29/15 1002  Gross per 24 hour  Intake    910 ml  Output    983 ml  Net    -73 ml   Filed Weights   07/25/15 2009 07/26/15 2001 07/28/15 2217  Weight: 79.2 kg (174 lb 9.7 oz) 83.4 kg (183 lb 13.8 oz) 78.1 kg (172 lb 2.9 oz)    Exam:   General:  ill-appearing, AAOx3, no distress.   Cardiovascular: regular rate and rhythm normal S1-S2  Respiratory: CTAB Abdomen: Abdomen is less distended, tympanic, non tender, diminished BS, NG out Musculoskeletal:1-2+ ankle edema  Data Reviewed: Basic Metabolic Panel:  Recent Labs Lab 07/25/15 0633 07/26/15 0602 07/27/15 0644 07/28/15 0349 07/29/15 0629  NA 149* 151* 152* 151* 151*  K 3.8 3.8 3.5 3.6 3.8  CL 114* 118* 118* 118* 117*  CO2 25 23 25 24 25   GLUCOSE 186* 131* 121* 101* 110*  BUN 32* 32* 27* 26* 26*  CREATININE 1.47* 1.30* 1.31* 1.21 1.28*  CALCIUM 8.4* 8.0* 8.1* 8.0* 8.2*   Liver Function Tests:  Recent Labs Lab 07/24/15 0522  AST 27  ALT 55  ALKPHOS 73  BILITOT 0.9  PROT 5.8*  ALBUMIN 2.0*   No results for input(s): LIPASE, AMYLASE in the last 168 hours. No results for input(s): AMMONIA in the last 168 hours. CBC:  Recent Labs Lab 07/24/15 0522 07/25/15 [id number] 07/26/15 0602 07/27/15 0644 07/28/15 0349 07/29/15 0629  WBC 19.9* 20.8* 20.7*  22.3* 21.9* 22.3*  NEUTROABS 17.1*  --   --   --   --   --   HGB 10.1* 10.1* 9.0* 9.4* 8.4* 8.2*  HCT 28.2* 29.5* 26.6* 28.3* 25.5* 25.0*  MCV 80.6 81.7 83.9 83.7 85.0 85.6  PLT 463* 562* 502* 552* 502* 489*   Cardiac Enzymes: No results for input(s): CKTOTAL, CKMB, CKMBINDEX, TROPONINI in the last 168 hours. BNP (last 3 results) No results for input(s): BNP in the last 8760 hours.  ProBNP (last 3 results) No results for input(s): PROBNP in the last 8760 hours.  CBG:  Recent Labs Lab 07/28/15 2205 07/29/15 0008 07/29/15 0401 07/29/15 0802 07/29/15 1145  GLUCAP 120* 103* 104* 126* 113*    Recent Results (from the past 240 hour(s))  Culture, blood (routine x 2)     Status: None   Collection Time: 07/19/15  2:10 PM  Result Value Ref Range Status   Specimen Description BLOOD RIGHT ANTECUBITAL  Final   Special Requests BOTTLES DRAWN AEROBIC ONLY  10CC  Final   Culture NO GROWTH 5 DAYS  Final   Report Status 07/24/2015 FINAL  Final  Culture, blood (routine x 2)     Status: None   Collection Time: 07/19/15  2:18 PM  Result Value Ref Range Status   Specimen Description BLOOD RIGHT HAND  Final   Special Requests IN PEDIATRIC BOTTLE  3CC  Final   Culture NO GROWTH 5 DAYS  Final   Report Status 07/24/2015 FINAL  Final  MRSA PCR Screening     Status: None   Collection Time: 07/19/15  4:12 PM  Result Value Ref Range Status   MRSA by PCR NEGATIVE NEGATIVE Final    Comment:        The GeneXpert MRSA Assay (FDA approved for NASAL specimens only), is one component of a comprehensive MRSA colonization surveillance program. It is not intended to diagnose MRSA infection nor to guide or monitor treatment for MRSA infections.   Culture, routine-abscess     Status: None   Collection Time: 07/21/15  2:42 PM  Result Value Ref Range Status   Specimen Description ABSCESS LIVER  Final   Special Requests NONE  Final   Gram Stain   Final    ABUNDANT WBC PRESENT, PREDOMINANTLY PMN NO  SQUAMOUS EPITHELIAL CELLS SEEN MODERATE GRAM POSITIVE COCCI IN PAIRS Performed at [facility]    Culture   Final    NO GROWTH 2 DAYS Performed at [facility]    Report Status 07/26/2015 FINAL  Final     Studies: No results found.  Scheduled Meds: . aspirin  81 mg Oral Daily  . carvedilol  6.25 mg Oral BID WC  . enoxaparin (LOVENOX) injection  40 mg Subcutaneous Q24H  . finasteride  5 mg Oral Daily  . furosemide  20 mg Oral Daily  . insulin aspart  0-15 Units Subcutaneous 6 times per day  . insulin detemir  6 Units Subcutaneous Daily  . piperacillin-tazobactam (ZOSYN)  IV  3.375 g Intravenous Q8H  . sodium chloride  3 mL Intravenous Q12H  . tamsulosin  0.4 mg Oral Daily   Continuous Infusions:    Principal Problem:   Sepsis ([place]) Active Problems:   Hypertension goal BP (blood pressure) < 140/90   Hyperlipidemia LDL goal <100   Chronic kidney disease (CKD), stage III (moderate)   Anemia of chronic renal failure   Acute renal failure (ARF) (HCC)   Transaminitis   Diabetes mellitus, insulin dependent (IDDM), uncontrolled (HCC)   Leukocytosis   Acute kidney injury ([place])   SOB (shortness of breath)   Liver abscess   Fever and chills   Gram-negative bacteremia ([place])   Abdominal distention   Ileus ([place])    Time spent: 35 min    Iden Stripling  [facility] Pager [phone] If 7PM-7AM, please contact night-coverage at [email or web address], password [facility] 07/29/2015, 1:13 PM  LOS: 11 days

## 2015-07-30 ENCOUNTER — Inpatient Hospital Stay (HOSPITAL_COMMUNITY): Payer: Medicare Other

## 2015-07-30 DIAGNOSIS — K750 Abscess of liver: Secondary | ICD-10-CM | POA: Insufficient documentation

## 2015-07-30 LAB — COMPREHENSIVE METABOLIC PANEL
ALBUMIN: 1.7 g/dL — AB (ref 3.5–5.0)
ALK PHOS: 58 U/L (ref 38–126)
ALT: 22 U/L (ref 17–63)
ANION GAP: 8 (ref 5–15)
AST: 32 U/L (ref 15–41)
BUN: 21 mg/dL — ABNORMAL HIGH (ref 6–20)
CHLORIDE: 114 mmol/L — AB (ref 101–111)
CO2: 26 mmol/L (ref 22–32)
Calcium: 7.8 mg/dL — ABNORMAL LOW (ref 8.9–10.3)
Creatinine, Ser: 1.25 mg/dL — ABNORMAL HIGH (ref 0.61–1.24)
GFR calc non Af Amer: 53 mL/min — ABNORMAL LOW (ref >60)
GLUCOSE: 90 mg/dL (ref 65–99)
Potassium: 3.2 mmol/L — ABNORMAL LOW (ref 3.5–5.1)
SODIUM: 148 mmol/L — AB (ref 135–145)
Total Bilirubin: 0.7 mg/dL (ref 0.3–1.2)
Total Protein: 5 g/dL — ABNORMAL LOW (ref 6.5–8.1)

## 2015-07-30 LAB — CBC
HCT: 19.9 % — ABNORMAL LOW (ref 39.0–52.0)
HEMATOCRIT: 20.7 % — AB (ref 39.0–52.0)
HEMOGLOBIN: 6.8 g/dL — AB (ref 13.0–17.0)
Hemoglobin: 6.8 g/dL — CL (ref 13.0–17.0)
MCH: 29.2 pg (ref 26.0–34.0)
MCH: 28.2 pg (ref 26.0–34.0)
MCHC: 34.2 g/dL (ref 30.0–36.0)
MCHC: 32.9 g/dL (ref 30.0–36.0)
MCV: 85.4 fL (ref 78.0–100.0)
MCV: 85.9 fL (ref 78.0–100.0)
PLATELETS: 384 10*3/uL (ref 150–400)
PLATELETS: 394 10*3/uL (ref 150–400)
RBC: 2.33 MIL/uL — AB (ref 4.22–5.81)
RBC: 2.41 MIL/uL — ABNORMAL LOW (ref 4.22–5.81)
RDW: 14.8 % (ref 11.5–15.5)
RDW: 15 % (ref 11.5–15.5)
WBC: 16.6 10*3/uL — ABNORMAL HIGH (ref 4.0–10.5)
WBC: 15.6 10*3/uL — AB (ref 4.0–10.5)

## 2015-07-30 LAB — PREPARE RBC (CROSSMATCH)

## 2015-07-30 LAB — GLUCOSE, CAPILLARY
GLUCOSE-CAPILLARY: 98 mg/dL (ref 65–99)
GLUCOSE-CAPILLARY: 129 mg/dL — AB (ref 65–99)
GLUCOSE-CAPILLARY: 56 mg/dL — AB (ref 65–99)
Glucose-Capillary: 114 mg/dL — ABNORMAL HIGH (ref 65–99)
Glucose-Capillary: 67 mg/dL (ref 65–99)

## 2015-07-30 LAB — ABO/RH: ABO/RH(D): O POS

## 2015-07-30 MED ORDER — PANTOPRAZOLE SODIUM 40 MG IV SOLR
40.0000 mg | Freq: Two times a day (BID) | INTRAVENOUS | Status: DC
  Administered 2015-07-30 – 2015-07-31 (×4): 40 mg via INTRAVENOUS
  Filled 2015-07-30 (×4): qty 40

## 2015-07-30 MED ORDER — DEXTROSE 50 % IV SOLN
INTRAVENOUS | Status: AC
  Administered 2015-07-30: 50 mL
  Filled 2015-07-30: qty 50

## 2015-07-30 MED ORDER — SODIUM CHLORIDE 0.9 % IV SOLN: Freq: Once | INTRAVENOUS | Status: DC

## 2015-07-30 MED ORDER — IOHEXOL 300 MG/ML  SOLN
100.0000 mL | Freq: Once | INTRAMUSCULAR | Status: AC | PRN
  Administered 2015-07-30: 100 mL via INTRAVENOUS

## 2015-07-30 MED ORDER — FUROSEMIDE 10 MG/ML IJ SOLN
20.0000 mg | Freq: Once | INTRAMUSCULAR | Status: DC
  Filled 2015-07-30: qty 2

## 2015-07-30 NOTE — Progress Notes (Addendum)
[facility] PROGRESS NOTE  Brian [name] [id number] DOB: [birth year] DOA: 07/18/2015 PCP: [name], MD  Interim summary Brian [name] is a 81 year old gentleman with a past medical history of diabetes mellitus, admitted to the medicine service on 07/18/2015 when he presented with complaints of generalized weakness, dizziness, chills.  He was found to be febrile having a temperature of 103 and started on broad-spectrum IV antimicrobial therapy with vancomycin and Zosyn.  CT scan of abdomen and pelvis performed on 07/20/2015 showed a hypoattenuating lesion within the liver. This was further worked up with an MRI of liver that showed findings compatible with abscess collection. Interventional radiology was consulted for drain placement that was performed on 07/21/2015. Blood cultures obtained on admission growing 1/4 with Prevotella, with repeat blood cultures show no growth to date. Cultures from abscess growing gram-positive cocci.  Awaiting organism identification and susceptibility testing. Hospital course complicated by ileus/PSBO, treated conservatively with NG decompression, bowel rest, supportive care. CCS consulting, ileus resolved now. -however without much output from liver abscess drain and persistent leukocytosis, repeat CT today  Assessment/Plan: 1. Severe sepsis/Liver abscess -On 07/20/2015: CT scan of abdomen and pelvis performed revealing 7.1 x 5.0 x 5.7 cm hypoattenuating lesion within the liver. Radiology reporting that this could represent an abscess vs primary hepatic malignancy. This was further worked up with MRI of liver that demonstrated findings compatible with abscess collection.    -On 07/21/2015 blood cultures drawn from 07/18/2015 growing 1/4 with Prevotella. Ceftriaxone changed to Fortaz 1 g IV every 12 hours and Vancomycin discontinued. -11/26  Interventional radiology placed drain catheter of liver abscess . Gram stain showing gram positive cocci in  pairs from abscess, but culture no growth 2 days -Dr. Baxter Flattery of infectious disease, consulting -CT 11/29 without change in size of abscess, but too early to know, minimal output from drain in past 2days, IR following may need additional drain or upsizing of current drain, plan for repeat CT tomorrow -WBC slightly improved, remains on IV Zosyn -i think he will need a colonoscopy down the road, wife reports last one >5-10years ago -reportedly normal   2.  Ileus -Overnight 11/29, patient developed multiple episodes of nausea and vomiting and noted to have distended abdomen,  abdominal x-ray that showed adynamic ileus. -s/p NG decompression, improving, NG out now -tolerating diet -appreciate CCS consulted  3.  Acute on chronic renal failure -Likely secondary to ATN in setting of hypotension or perhaps sepsis/critical illness -baseline between 1.4 1.7,on admit creatinine of 2.85 with BUN of 36 -stopped IVF, starting to have some leg swelling in last 24h  4.  Elevated transaminases -due to abscesses most likely -S/P drainage of liver abscess on 07/21/2015.  -Labs showing improvement to elevated liver enzymes   5.  Elevated troponin. -Lab work showing upward trend in his troponin to 0.30, no chest pain. -related to demand ischemia in setting of critical illness, Troponins trending down.  6. Diabetes mellitus -continue lantus,SSI  7.  History of hypertension. -He presented with hypotension for which antihypertensive medications were discontinued  8. Acute blood loss anemia Melena overnight/drop in Hb -IV PPI, transfuse 2 units PRBC, will d/w GI -Iv PPI q12, stop SQ lovenox   DVT prophylaxis. Lovenox  Code Status: full code Family Communication: family member at bedside Disposition Plan: pending improvement in ileus and abscesses   Antibiotics:  Vancomycin started on 07/18/2015 stopped on 07/20/2015  Zosyn discontinued on 07/19/2015  Ceftriaxone started on 07/19/2015 stopped  on 07/20/2015   Doxycycline started  on 07/19/2015   [name] started on 07/20/2015  HPI/Subjective: Had  3  BM again last night , feels ok, no vomiting, dyspnea with activity, reports black stools  Objective: Filed Vitals:   07/29/15 1657 07/29/15 2205  BP: 139/69 127/63  Pulse: 87 90  Temp: 97.8 F (36.6 C) 98.4 F (36.9 C)  Resp: 18 16    Intake/Output Summary (Last 24 hours) at 07/30/15 1256 Last data filed at 07/30/15 0810  Gross per 24 hour  Intake   1085 ml  Output    376 ml  Net    709 ml   Filed Weights   07/25/15 2009 07/26/15 2001 07/28/15 2217  Weight: 79.2 kg (174 lb 9.7 oz) 83.4 kg (183 lb 13.8 oz) 78.1 kg (172 lb 2.9 oz)    Exam:   General:  ill-appearing, AAOx3, no distress.   Cardiovascular: regular rate and rhythm normal S1-S2  Respiratory: CTAB Abdomen: Abdomen is less distended, tympanic, non tender, diminished BS, NG out Musculoskeletal:1-2+ ankle edema  Data Reviewed: Basic Metabolic Panel:  Recent Labs Lab 07/26/15 0602 07/27/15 0644 07/28/15 0349 07/29/15 0629 07/30/15 0531  NA 151* 152* 151* 151* 148*  K 3.8 3.5 3.6 3.8 3.2*  CL 118* 118* 118* 117* 114*  CO2 23 25 24 25 26   GLUCOSE 131* 121* 101* 110* 90  BUN 32* 27* 26* 26* 21*  CREATININE 1.30* 1.31* 1.21 1.28* 1.25*  CALCIUM 8.0* 8.1* 8.0* 8.2* 7.8*   Liver Function Tests:  Recent Labs Lab 07/24/15 0522 07/30/15 0531  AST 27 32  ALT 55 22  ALKPHOS 73 58  BILITOT 0.9 0.7  PROT 5.8* 5.0*  ALBUMIN 2.0* 1.7*   No results for input(s): LIPASE, AMYLASE in the last 168 hours. No results for input(s): AMMONIA in the last 168 hours. CBC:  Recent Labs Lab 07/24/15 0522  07/26/15 0602 07/27/15 0644 07/28/15 0349 07/29/15 0629 07/30/15 0531  WBC 19.9*  < > 20.7* 22.3* 21.9* 22.3* 16.6*  NEUTROABS 17.1*  --   --   --   --   --   --   HGB 10.1*  < > 9.0* 9.4* 8.4* 8.2* 6.8*  HCT 28.2*  < > 26.6* 28.3* 25.5* 25.0* 19.9*  MCV 80.6  < > 83.9 83.7 85.0 85.6 85.4  PLT  463*  < > 502* 552* 502* 489* 384  < > = values in this interval not displayed. Cardiac Enzymes: No results for input(s): CKTOTAL, CKMB, CKMBINDEX, TROPONINI in the last 168 hours. BNP (last 3 results) No results for input(s): BNP in the last 8760 hours.  ProBNP (last 3 results) No results for input(s): PROBNP in the last 8760 hours.  CBG:  Recent Labs Lab 07/29/15 1145 07/29/15 1658 07/29/15 2202 07/30/15 0824 07/30/15 1210  GLUCAP 113* 115* 125* 98 114*    Recent Results (from the past 240 hour(s))  Culture, routine-abscess     Status: None   Collection Time: 07/21/15  2:42 PM  Result Value Ref Range Status   Specimen Description ABSCESS LIVER  Final   Special Requests NONE  Final   Gram Stain   Final    ABUNDANT WBC PRESENT, PREDOMINANTLY PMN NO SQUAMOUS EPITHELIAL CELLS SEEN MODERATE GRAM POSITIVE COCCI IN PAIRS Performed at [facility]    Culture   Final    NO GROWTH 2 DAYS Performed at [facility]    Report Status 07/26/2015 FINAL  Final     Studies: No results found.  Scheduled Meds: .  sodium chloride   Intravenous Once  . carvedilol  6.25 mg Oral BID WC  . finasteride  5 mg Oral Daily  . furosemide  20 mg Intravenous Once  . furosemide  20 mg Oral Daily  . insulin aspart  0-15 Units Subcutaneous TID WC  . insulin aspart  0-5 Units Subcutaneous QHS  . insulin detemir  6 Units Subcutaneous Daily  . pantoprazole (PROTONIX) IV  40 mg Intravenous Q12H  . piperacillin-tazobactam (ZOSYN)  IV  3.375 g Intravenous Q8H  . sodium chloride  3 mL Intravenous Q12H  . tamsulosin  0.4 mg Oral Daily   Continuous Infusions:    Principal Problem:   Sepsis ([place]) Active Problems:   Hypertension goal BP (blood pressure) < 140/90   Hyperlipidemia LDL goal <100   Chronic kidney disease (CKD), stage III (moderate)   Anemia of chronic renal failure   Acute renal failure (ARF) (HCC)   Transaminitis   Diabetes mellitus, insulin dependent (IDDM),  uncontrolled (HCC)   Leukocytosis   Acute kidney injury ([place])   SOB (shortness of breath)   Liver abscess   Fever and chills   Gram-negative bacteremia (HCC)   Abdominal distention   Ileus (HCC)   Abscess, liver    Time spent: 35 min    Emalie Mcwethy  [facility] Pager [phone] If 7PM-7AM, please contact night-coverage at [email or web address], password [facility] 07/30/2015, 12:56 PM  LOS: 12 days

## 2015-07-30 NOTE — Clinical Documentation Improvement (Signed)
Internal Medicine  Abnormal Lab/Test Results:  07/29/15: Na= 151; 07/30/15: Na= 148  Possible Clinical Conditions associated with below indicators  Hypernatremia  Other Condition  Cannot Clinically Determine  Please exercise your independent, professional judgment when responding. A specific answer is not anticipated or expected.   Thank You,  Rolm Gala, [place] [phone]

## 2015-07-30 NOTE — Progress Notes (Signed)
Referring Physician(s): Dr Coralyn Pear  Chief Complaint:  Hepatic abscess  Subjective:  Drain placed 11/26 Still draining Although none recorded in computer---30 cc blood tinged fluid in JP For CT today  Pt does say he feels better  Allergies: Niaspan; Shellfish allergy; Statins; and Zetia  Medications: Prior to Admission medications   Medication Sig Start Date End Date Taking? Authorizing Provider  amLODipine (NORVASC) 10 MG tablet Take 1 tablet (10 mg total) by mouth daily. 07/10/15  Yes [name], MD  aspirin EC 81 MG tablet Take 81 mg by mouth daily.   Yes Historical Provider, MD  carvedilol (COREG) 6.25 MG tablet Take 1 tablet by mouth 2 (two) times daily. 02/27/15  Yes Historical Provider, MD  docusate sodium (COLACE) 100 MG capsule Take 1 capsule (100 mg total) by mouth 2 (two) times daily. 04/24/15  Yes [name], MD  fenofibrate 160 MG tablet Take 1 tablet (160 mg total) by mouth daily. 06/05/15  Yes [name], MD  finasteride (PROSCAR) 5 MG tablet Take 1 tablet (5 mg total) by mouth daily. 06/15/15  Yes [name], MD  glimepiride (AMARYL) 2 MG tablet Take 1 tablet (2 mg total) by mouth 2 (two) times daily. 06/05/15  Yes [name], MD  Insulin Detemir (LEVEMIR) 100 UNIT/ML Pen Inject 8 Units into the skin daily. 05/08/15  Yes [name], MD  Multiple Vitamin (MULTIVITAMIN WITH MINERALS) TABS tablet Take 1 tablet by mouth daily.   Yes Historical Provider, MD  quinapril (ACCUPRIL) 20 MG tablet Take 1 tablet (20 mg total) by mouth 2 (two) times daily. 02/14/15  Yes [name], MD  sitaGLIPtin (JANUVIA) 25 MG tablet Take 1 tablet (25 mg total) by mouth daily. 05/03/15  Yes [name], MD  Tamsulosin HCl (FLOMAX) 0.4 MG CAPS Take 0.4 mg by mouth daily.   Yes Historical Provider, MD  Blood Glucose Monitoring Suppl (ONE TOUCH ULTRA 2) W/DEVICE KIT  04/24/15   Historical Provider, MD  Cholecalciferol (VITAMIN D3) 1000 UNITS CAPS Take 1 capsule  by mouth daily.    Historical Provider, MD  diphenoxylate-atropine (LOMOTIL) 2.5-0.025 MG per tablet Take 1 tablet by mouth 4 (four) times daily as needed for diarrhea or loose stools. 04/24/15   [name], MD  glucose blood test strip Use as instructed 03/28/15   [name], MD  meloxicam (MOBIC) 7.5 MG tablet Take 7.5 mg by mouth 2 (two) times daily.     Historical Provider, MD     Vital Signs: BP 127/63 mmHg  Pulse 90  Temp(Src) 98.4 F (36.9 C) (Oral)  Resp 16  Ht _0  (1.803 m)  Wt 172 lb 2.9 oz (78.1 kg)  BMI 24.02 kg/m2  SpO2 99%  Physical Exam  Constitutional: He is oriented to person, place, and time.  Abdominal: Soft.  Site of hepatic abscess clean and dry New dressing placed today Output 30 cc in JP now--blood tinged  Musculoskeletal: Normal range of motion.  Neurological: He is alert and oriented to person, place, and time.  Skin: Skin is warm and dry.  Nursing note and vitals reviewed.   Imaging: Dg Abd 1 View  07/27/2015  CLINICAL DATA:  ILEUS,NG TUBE.ABD DISCOMFORT EXAM: ABDOMEN - 1 VIEW COMPARISON:  07/26/2015 and CT 07/24/2015 FINDINGS: There is persistent significant dilatation of small bowel loops, with residual contrast in nondilated loops of colon. Colonic diverticula are present. There is no evidence for free intraperitoneal air on this supine view of the abdomen. A nasogastric tube is  identified in the left upper quadrant overlying the region of the stomach. Liver abscess drain is partially imaged in the right upper quadrant. IMPRESSION: Pattern consistent with small bowel obstruction. Electronically Signed   By: Nolon Nations M.D.   On: 07/27/2015 08:20    Labs:  CBC:  Recent Labs  07/27/15 0644 07/28/15 0349 07/29/15 0629 07/30/15 0531  WBC 22.3* 21.9* 22.3* 16.6*  HGB 9.4* 8.4* 8.2* 6.8*  HCT 28.3* 25.5* 25.0* 19.9*  PLT 552* 502* 489* 384    COAGS:  Recent Labs  07/19/15 1625 07/21/15 0256  INR 1.51* 1.42  APTT 40* 41*      BMP:  Recent Labs  07/27/15 0644 07/28/15 0349 07/29/15 0629 07/30/15 0531  NA 152* 151* 151* 148*  K 3.5 3.6 3.8 3.2*  CL 118* 118* 117* 114*  CO2 _0 GLUCOSE 121* 101* 110* 90  BUN 27* 26* 26* 21*  CALCIUM 8.1* 8.0* 8.2* 7.8*  CREATININE 1.31* 1.21 1.28* 1.25*  GFRNONAA 50* 55* 52* 53*  GFRAA 58* >60 60* >60    LIVER FUNCTION TESTS:  Recent Labs  07/20/15 0547 07/22/15 0307 07/24/15 0522 07/30/15 0531  BILITOT 0.7 0.6 0.9 0.7  AST 179* 66* 27 32  ALT 142* 90* 55 22  ALKPHOS 60 61 73 58  PROT 5.4* 5.4* 5.8* 5.0*  ALBUMIN 2.2* 1.9* 2.0* 1.7*    Assessment and Plan:  Hepatic abscess drain intact For CT today Will check asap  Signed: Americus Scheurich A 07/30/2015, 9:39 AM   I spent a total of 15 Minutes at the the patient's bedside AND on the patient's hospital floor or unit, greater than 50% of which was counseling/coordinating care for hepatic abscess drain

## 2015-07-30 NOTE — Progress Notes (Signed)
Patient ID: Brian [name], male   DOB: [birth year], 81 y.o.   MRN: [id number]    Subjective: Up on bedside commode.  Feels ok.  No abdominal pain.  Tolerating solid diet.    Objective: Vital signs in last 24 hours: Temp:  [97.8 F (36.6 C)-98.4 F (36.9 C)] 98.4 F (36.9 C) (12/04 2205) Pulse Rate:  [87-90] 90 (12/04 2205) Resp:  [16-18] 16 (12/04 2205) BP: (127-139)/(63-69) 127/63 mmHg (12/04 2205) SpO2:  [98 %-99 %] 99 % (12/04 2205) Last BM Date: 07/28/15  Intake/Output from previous day: 12/04 0701 - 12/05 0700 In: 1385 [P.O.:1380] Out: 501 [Urine:500; Stool:1] Intake/Output this shift:    PE: Abd: soft, NT, ND, +BS, JP drain with minimal serous drainage  Lab Results:   Recent Labs  07/29/15 0629 07/30/15 0531  WBC 22.3* 16.6*  HGB 8.2* 6.8*  HCT 25.0* 19.9*  PLT 489* 384   BMET  Recent Labs  07/28/15 0349 07/29/15 0629  NA 151* 151*  K 3.6 3.8  CL 118* 117*  CO2 24 25  GLUCOSE 101* 110*  BUN 26* 26*  CREATININE 1.21 1.28*  CALCIUM 8.0* 8.2*   PT/INR No results for input(s): LABPROT, INR in the last 72 hours. CMP     Component Value Date/Time   NA 151* 07/29/2015 0629   K 3.8 07/29/2015 0629   CL 117* 07/29/2015 0629   CO2 25 07/29/2015 0629   GLUCOSE 110* 07/29/2015 0629   BUN 26* 07/29/2015 0629   CREATININE 1.28* 07/29/2015 0629   CALCIUM 8.2* 07/29/2015 0629   PROT 5.8* 07/24/2015 0522   ALBUMIN 2.0* 07/24/2015 0522   AST 27 07/24/2015 0522   ALT 55 07/24/2015 0522   ALKPHOS 73 07/24/2015 0522   BILITOT 0.9 07/24/2015 0522   GFRNONAA 52* 07/29/2015 0629   GFRAA 60* 07/29/2015 0629   Lipase  No results found for: LIPASE     Studies/Results: No results found.  Anti-infectives: Anti-infectives    Start     Dose/Rate Route Frequency Ordered Stop   07/25/15 0000  piperacillin-tazobactam (ZOSYN) IVPB 3.375 g  Status:  Discontinued     3.375 g 12.5 mL/hr over 240 Minutes Intravenous 3 times per day 07/24/15 1705 07/24/15 1723    07/25/15 0000  piperacillin-tazobactam (ZOSYN) IVPB 3.375 g     3.375 g 12.5 mL/hr over 240 Minutes Intravenous Every 8 hours 07/24/15 1723     07/24/15 1715  piperacillin-tazobactam (ZOSYN) IVPB 3.375 g     3.375 g 100 mL/hr over 30 Minutes Intravenous  Once 07/24/15 1704 07/24/15 1838   07/21/15 1430  cefTRIAXone (ROCEPHIN) 2 g in dextrose 5 % 50 mL IVPB  Status:  Discontinued     2 g 100 mL/hr over 30 Minutes Intravenous Every 24 hours 07/20/15 1849 07/20/15 1850   07/21/15 1000  cefTRIAXone (ROCEPHIN) 2 g in dextrose 5 % 50 mL IVPB  Status:  Discontinued     2 g 100 mL/hr over 30 Minutes Intravenous Every 24 hours 07/20/15 1850 07/20/15 1855   07/20/15 1915  cefTAZidime (FORTAZ) 1 g in dextrose 5 % 50 mL IVPB  Status:  Discontinued     1 g 100 mL/hr over 30 Minutes Intravenous Every 12 hours 07/20/15 1909 07/24/15 1700   07/20/15 1645  metroNIDAZOLE (FLAGYL) IVPB 500 mg  Status:  Discontinued     500 mg 100 mL/hr over 60 Minutes Intravenous Every 8 hours 07/20/15 1630 07/24/15 1700   07/19/15 1430  doxycycline (VIBRAMYCIN) 100 mg  in dextrose 5 % 250 mL IVPB  Status:  Discontinued     100 mg 125 mL/hr over 120 Minutes Intravenous Every 12 hours 07/19/15 1417 07/22/15 1034   07/19/15 1430  cefTRIAXone (ROCEPHIN) 1 g in dextrose 5 % 50 mL IVPB  Status:  Discontinued     1 g 100 mL/hr over 30 Minutes Intravenous Every 24 hours 07/19/15 1417 07/20/15 1849   07/18/15 2200  piperacillin-tazobactam (ZOSYN) IVPB 3.375 g  Status:  Discontinued     3.375 g 12.5 mL/hr over 240 Minutes Intravenous 3 times per day 07/18/15 1850 07/19/15 1415   07/18/15 1900  vancomycin (VANCOCIN) IVPB 750 mg/150 ml premix  Status:  Discontinued     750 mg 150 mL/hr over 60 Minutes Intravenous Every 24 hours 07/18/15 1850 07/20/15 1630   07/18/15 1300  cefTRIAXone (ROCEPHIN) 1 g in dextrose 5 % 50 mL IVPB  Status:  Discontinued     1 g 100 mL/hr over 30 Minutes Intravenous Every 24 hours 07/18/15 1241  07/18/15 1834       Assessment/Plan  1. Liver abscess, s/p perc drain -he is supposed to get a repeat CT scan today to determine resolution of abscess vs persistent abscess with needs of upsizing drain. -cont regular diet as ileus has resolved.   LOS: 12 days    Yonas Bunda E 07/30/2015, 8:17 AM Pager: [phone]

## 2015-07-30 NOTE — Progress Notes (Signed)
ANTIBIOTIC CONSULT NOTE - Follow-up  Pharmacy Consult for Zosyn Indication: gram negative bacteremia with liver abscess  Patient Measurements: Height: 5\' 11"  (180.3 cm) Weight: 172 lb 2.9 oz (78.1 kg) IBW/kg (Calculated) : 75.3  Vital Signs: Temp: 98.4 F (36.9 C) (12/04 2205) Temp Source: Oral (12/04 2205) BP: 127/63 mmHg (12/04 2205) Pulse Rate: 90 (12/04 2205)    Labs:  Recent Labs  07/28/15 0349 07/29/15 0629 07/30/15 0531  WBC 21.9* 22.3* 16.6*  HGB 8.4* 8.2* 6.8*  PLT 502* 489* 384  CREATININE 1.21 1.28* 1.25*   Microbiology: Cx data:  11/23 blood: B-lactamase negative, Prevotella species. 11/23 urine: multiple species 11/24 blood: ngF 11/26 liver abscess: ngF  Anti-infective's  vanc 11/23>>11/25 Zosyn 11/23>>11/24 - restarted 11/29 >>  rocephin 11/24>>11/25 Doxy 11/24>>11/24 Ceftaz 11/25 >> 11/29 Flagyl 11/25>>11/29  Assessment: 63 yoM with B-lactamase negative, Prevotella species bacteremia and liver abscess. Currently on Zosyn. WBC increasing with slight decrease over last 24H, afebrile and tolerating advancement of diet. Repeat CT today to evaluate liver abscess.  Goal of Therapy:  treatment of infection   Plan:  1. Continue Zosyn 3.375mg  Q8H 2. Following daily; notes prn  Vincenza Hews, PharmD, BCPS 07/30/2015, 8:26 AM Pager: [phone]

## 2015-07-30 NOTE — Care Management Important Message (Signed)
Important Message  Patient Details  Name: Brian [name] MRN: [id number] Date of Birth: [birth year]   Medicare Important Message Given:  Yes    [name] 07/30/2015, 12:02 PM

## 2015-07-30 NOTE — Progress Notes (Signed)
INFECTIOUS DISEASE PROGRESS NOTE  ID: Brian [name] is a 81 y.o. male with  Principal Problem:   Sepsis ([place]) Active Problems:   Hypertension goal BP (blood pressure) < 140/90   Hyperlipidemia LDL goal <100   Chronic kidney disease (CKD), stage III (moderate)   Anemia of chronic renal failure   Acute renal failure (ARF) (HCC)   Transaminitis   Diabetes mellitus, insulin dependent (IDDM), uncontrolled (HCC)   Leukocytosis   Acute kidney injury ([place])   SOB (shortness of breath)   Liver abscess   Fever and chills   Gram-negative bacteremia (HCC)   Abdominal distention   Ileus (HCC)   Abscess, liver  Subjective: Without complaints  Abtx:  Anti-infectives    Start     Dose/Rate Route Frequency Ordered Stop   07/25/15 0000  piperacillin-tazobactam (ZOSYN) IVPB 3.375 g  Status:  Discontinued     3.375 g 12.5 mL/hr over 240 Minutes Intravenous 3 times per day 07/24/15 1705 07/24/15 1723   07/25/15 0000  piperacillin-tazobactam (ZOSYN) IVPB 3.375 g     3.375 g 12.5 mL/hr over 240 Minutes Intravenous Every 8 hours 07/24/15 1723     07/24/15 1715  piperacillin-tazobactam (ZOSYN) IVPB 3.375 g     3.375 g 100 mL/hr over 30 Minutes Intravenous  Once 07/24/15 1704 07/24/15 1838   07/21/15 1430  cefTRIAXone (ROCEPHIN) 2 g in dextrose 5 % 50 mL IVPB  Status:  Discontinued     2 g 100 mL/hr over 30 Minutes Intravenous Every 24 hours 07/20/15 1849 07/20/15 1850   07/21/15 1000  cefTRIAXone (ROCEPHIN) 2 g in dextrose 5 % 50 mL IVPB  Status:  Discontinued     2 g 100 mL/hr over 30 Minutes Intravenous Every 24 hours 07/20/15 1850 07/20/15 1855   07/20/15 1915  cefTAZidime (FORTAZ) 1 g in dextrose 5 % 50 mL IVPB  Status:  Discontinued     1 g 100 mL/hr over 30 Minutes Intravenous Every 12 hours 07/20/15 1909 07/24/15 1700   07/20/15 1645  metroNIDAZOLE (FLAGYL) IVPB 500 mg  Status:  Discontinued     500 mg 100 mL/hr over 60 Minutes Intravenous Every 8 hours 07/20/15 1630 07/24/15  1700   07/19/15 1430  doxycycline (VIBRAMYCIN) 100 mg in dextrose 5 % 250 mL IVPB  Status:  Discontinued     100 mg 125 mL/hr over 120 Minutes Intravenous Every 12 hours 07/19/15 1417 07/22/15 1034   07/19/15 1430  cefTRIAXone (ROCEPHIN) 1 g in dextrose 5 % 50 mL IVPB  Status:  Discontinued     1 g 100 mL/hr over 30 Minutes Intravenous Every 24 hours 07/19/15 1417 07/20/15 1849   07/18/15 2200  piperacillin-tazobactam (ZOSYN) IVPB 3.375 g  Status:  Discontinued     3.375 g 12.5 mL/hr over 240 Minutes Intravenous 3 times per day 07/18/15 1850 07/19/15 1415   07/18/15 1900  vancomycin (VANCOCIN) IVPB 750 mg/150 ml premix  Status:  Discontinued     750 mg 150 mL/hr over 60 Minutes Intravenous Every 24 hours 07/18/15 1850 07/20/15 1630   07/18/15 1300  cefTRIAXone (ROCEPHIN) 1 g in dextrose 5 % 50 mL IVPB  Status:  Discontinued     1 g 100 mL/hr over 30 Minutes Intravenous Every 24 hours 07/18/15 1241 07/18/15 1834      Medications:  Scheduled: . sodium chloride   Intravenous Once  . carvedilol  6.25 mg Oral BID WC  . finasteride  5 mg Oral Daily  . furosemide  20 mg Intravenous Once  . furosemide  20 mg Oral Daily  . insulin aspart  0-15 Units Subcutaneous TID WC  . insulin aspart  0-5 Units Subcutaneous QHS  . insulin detemir  6 Units Subcutaneous Daily  . pantoprazole (PROTONIX) IV  40 mg Intravenous Q12H  . piperacillin-tazobactam (ZOSYN)  IV  3.375 g Intravenous Q8H  . sodium chloride  3 mL Intravenous Q12H  . tamsulosin  0.4 mg Oral Daily    Objective: Vital signs in last 24 hours: Temp:  [97.8 F (36.6 C)-98.4 F (36.9 C)] 98.4 F (36.9 C) (12/04 2205) Pulse Rate:  [87-90] 90 (12/04 2205) Resp:  [16-18] 16 (12/04 2205) BP: (127-139)/(63-69) 127/63 mmHg (12/04 2205) SpO2:  [98 %-99 %] 99 % (12/04 2205)   General appearance: alert, cooperative and no distress GI: normal findings: bowel sounds normal and soft, non-tender and abnormal findings:  distended  Lab  Results  Recent Labs  07/29/15 0629 07/30/15 0531  WBC 22.3* 16.6*  HGB 8.2* 6.8*  HCT 25.0* 19.9*  NA 151* 148*  K 3.8 3.2*  CL 117* 114*  CO2 25 26  BUN 26* 21*  CREATININE 1.28* 1.25*   Liver Panel  Recent Labs  07/30/15 0531  PROT 5.0*  ALBUMIN 1.7*  AST 32  ALT 22  ALKPHOS 58  BILITOT 0.7   Sedimentation Rate No results for input(s): ESRSEDRATE in the last 72 hours. C-Reactive Protein No results for input(s): CRP in the last 72 hours.  Microbiology: Recent Results (from the past 240 hour(s))  Culture, routine-abscess     Status: None   Collection Time: 07/21/15  2:42 PM  Result Value Ref Range Status   Specimen Description ABSCESS LIVER  Final   Special Requests NONE  Final   Gram Stain   Final    ABUNDANT WBC PRESENT, PREDOMINANTLY PMN NO SQUAMOUS EPITHELIAL CELLS SEEN MODERATE GRAM POSITIVE COCCI IN PAIRS Performed at [facility]    Culture   Final    NO GROWTH 2 DAYS Performed at [facility]    Report Status 07/26/2015 FINAL  Final    Studies/Results: Ct Abdomen Pelvis W Contrast  07/30/2015  CLINICAL DATA:  Liver abscess. EXAM: CT ABDOMEN AND PELVIS WITH CONTRAST TECHNIQUE: Multidetector CT imaging of the abdomen and pelvis was performed using the standard protocol following bolus administration of intravenous contrast. CONTRAST:  137mL OMNIPAQUE IOHEXOL 300 MG/ML  SOLN COMPARISON:  CT scan of July 24, 2015. FINDINGS: Mild multilevel degenerative disc disease is noted in the lumbar spine. Moderate right pleural effusion is noted with adjacent subsegmental atelectasis. This is increased compared to prior exam. Stable mild left pleural effusion with adjacent subsegmental atelectasis is noted. No gallstones are noted. Abscess seen in the posterior segment of the right hepatic lobe currently measures 7.8 x 7.7 x 4.8 cm. It is not significantly changed in size or appearance compared to prior exam. Stable positioning of drainage catheter  is noted. The spleen and pancreas appear normal. Stable adrenal enlargement is noted compared to prior exam. Stable bilateral renal cysts are noted stable small nonobstructive calculus seen in lower pole collecting system of left kidney. No hydronephrosis or renal obstruction is noted. No renal or ureteral calculi are noted. Atherosclerosis of abdominal aorta is noted without aneurysm formation. The appendix appears normal. There is no evidence of bowel obstruction. No abnormal fluid collection is noted. Small fat containing periumbilical hernia is noted. Sigmoid diverticulosis is noted without inflammation. Urinary bladder appears normal. Mild  amount of free fluid is noted posteriorly in the pelvis which is decreased compared to prior exam. Mild anasarca is noted. No significant adenopathy is noted. IMPRESSION: Moderate right pleural effusion is noted with adjacent subsegmental atelectasis which is slightly enlarged compared to prior exam. Stable mild left pleural effusion. No change seen in size or appearance of abscess seen in posterior segment of right hepatic lobe. Stable positioning of drainage catheter is noted. Atherosclerosis of abdominal aorta is noted without aneurysm formation. Sigmoid diverticulosis is noted without inflammation. Stable small fat containing periumbilical hernia is noted. Electronically Signed   By: Marijo Conception, M.D.   On: 07/30/2015 13:48     Assessment/Plan: Liver abscess GNR bacteremia (1/4 prevotella. Liver abscess Cx GPC on stain) Ileus arf Protein calorie malnutrition  Total days of antibiotics: 10 zosyn      His abscess size is unchanged on CT today.  Will defer to IR, surgery regarding moving or replacing drain.  Afebrile, WBC improving.  No change in anbx for now.  Explained to pt, family     Bobby Rumpf Infectious Diseases (pager) [phone] [email or web address] 07/30/2015, 3:42 PM  LOS: 12 days

## 2015-07-30 NOTE — Progress Notes (Signed)
Physical Therapy Treatment Patient Details Name: Brian [name] MRN: [id number] DOB: 1933/10/24 Today's Date: 07/30/2015    History of Present Illness Admitted with weakness, fever tachycardia, sepsis likely secondary to lover abscess, drain placed by interventional Radiology on 11/26; since drain placement overall status improving; PMH significant for DM    PT Comments    Pt with improved stability and activity tolerance this date. Pt does requires RW for safe ambulation and to improve ambulation tolerance. It assists in energy conservation.  Pt progressing well towards all goals. Suspect pt will be able to d/c home with spouse once medically stable.   Follow Up Recommendations  Home health PT;Supervision/Assistance - 24 hour     Equipment Recommendations  Rolling walker with 5" wheels;3in1 (PT)    Recommendations for Other Services OT consult     Precautions / Restrictions Precautions Precautions: Fall Restrictions Weight Bearing Restrictions: No    Mobility  Bed Mobility Overal bed mobility: Needs Assistance Bed Mobility: Supine to Sit;Sit to Supine     Supine to sit: Supervision;HOB elevated     General bed mobility comments: used rails and HOB elevated  Transfers Overall transfer level: Needs assistance Equipment used: Rolling walker (2 wheeled);1 person hand held assist Transfers: Sit to/from Stand Sit to Stand: Min guard         General transfer comment: v/c's for safe hand placement  Ambulation/Gait Ambulation/Gait assistance: Min guard Ambulation Distance (Feet): 400 Feet Assistive device: Rolling walker (2 wheeled) Gait Pattern/deviations: Step-through pattern (pigeon toed gait) Gait velocity: decreased Gait velocity interpretation: Below normal speed for age/gender General Gait Details: v/c's for walker safety when going to the bathroom and then going up to sink to wash hands   Stairs            Wheelchair Mobility    Modified  Rankin (Stroke Patients Only)       Balance Overall balance assessment: Needs assistance         Standing balance support: No upper extremity supported Standing balance-Leahy Scale: Fair Standing balance comment: stood at sink to wash hands, and stood at [employer] to urinate                    Cognition Arousal/Alertness: Awake/alert Behavior During Therapy: WFL for tasks assessed/performed Overall Cognitive Status: Within Functional Limits for tasks assessed                      Exercises      General Comments General comments (skin integrity, edema, etc.): pt with R flank JP drain      Pertinent Vitals/Pain Pain Assessment: No/denies pain Pain Location: mild soreness at drain site    Home Living                      Prior Function            PT Goals (current goals can now be found in the care plan section) Progress towards PT goals: Progressing toward goals    Frequency  Min 3X/week    PT Plan Current plan remains appropriate    Co-evaluation             End of Session Equipment Utilized During Treatment: Gait belt Activity Tolerance: Patient limited by fatigue;Patient limited by pain Patient left: in chair;with call bell/phone within reach;with family/visitor present;with nursing/sitter in room     Time: 1415-1435 PT Time Calculation (min) (ACUTE ONLY): 20 min  Charges:  $  Gait Training: 8-22 mins                    G Codes:      Kingsley Callander 07/30/2015, 2:50 PM   Kittie Plater, PT, DPT Pager #: [phone] Office #: [phone]

## 2015-07-30 NOTE — Progress Notes (Signed)
Critical hemoglobin 6.8, paged Dr. Venora Maples

## 2015-07-31 ENCOUNTER — Encounter (HOSPITAL_COMMUNITY): Payer: Self-pay | Admitting: Gastroenterology

## 2015-07-31 LAB — COMPREHENSIVE METABOLIC PANEL
ALBUMIN: 1.8 g/dL — AB (ref 3.5–5.0)
ALK PHOS: 58 U/L (ref 38–126)
ALT: 22 U/L (ref 17–63)
ANION GAP: 9 (ref 5–15)
AST: 29 U/L (ref 15–41)
BILIRUBIN TOTAL: 1.1 mg/dL (ref 0.3–1.2)
BUN: 14 mg/dL (ref 6–20)
CALCIUM: 7.9 mg/dL — AB (ref 8.9–10.3)
CO2: 25 mmol/L (ref 22–32)
Chloride: 110 mmol/L (ref 101–111)
Creatinine, Ser: 1.22 mg/dL (ref 0.61–1.24)
GFR calc Af Amer: >60 mL/min (ref >60)
GFR calc non Af Amer: 55 mL/min — ABNORMAL LOW (ref >60)
GLUCOSE: 100 mg/dL — AB (ref 65–99)
POTASSIUM: 3.4 mmol/L — AB (ref 3.5–5.1)
SODIUM: 144 mmol/L (ref 135–145)
TOTAL PROTEIN: 5.3 g/dL — AB (ref 6.5–8.1)

## 2015-07-31 LAB — CBC
HEMATOCRIT: 27.5 % — AB (ref 39.0–52.0)
HEMATOCRIT: 29.6 % — AB (ref 39.0–52.0)
HEMOGLOBIN: 9.2 g/dL — AB (ref 13.0–17.0)
Hemoglobin: 9.8 g/dL — ABNORMAL LOW (ref 13.0–17.0)
MCH: 28.4 pg (ref 26.0–34.0)
MCH: 28.1 pg (ref 26.0–34.0)
MCHC: 33.5 g/dL (ref 30.0–36.0)
MCHC: 33.1 g/dL (ref 30.0–36.0)
MCV: 84.9 fL (ref 78.0–100.0)
MCV: 84.8 fL (ref 78.0–100.0)
PLATELETS: 370 10*3/uL (ref 150–400)
Platelets: 348 10*3/uL (ref 150–400)
RBC: 3.24 MIL/uL — ABNORMAL LOW (ref 4.22–5.81)
RBC: 3.49 MIL/uL — ABNORMAL LOW (ref 4.22–5.81)
RDW: 15.5 % (ref 11.5–15.5)
RDW: 15.9 % — AB (ref 11.5–15.5)
WBC: 17.4 10*3/uL — AB (ref 4.0–10.5)
WBC: 16.9 10*3/uL — AB (ref 4.0–10.5)

## 2015-07-31 LAB — GLUCOSE, CAPILLARY
GLUCOSE-CAPILLARY: 79 mg/dL (ref 65–99)
GLUCOSE-CAPILLARY: 119 mg/dL — AB (ref 65–99)
Glucose-Capillary: 108 mg/dL — ABNORMAL HIGH (ref 65–99)
Glucose-Capillary: 114 mg/dL — ABNORMAL HIGH (ref 65–99)
Glucose-Capillary: 95 mg/dL (ref 65–99)

## 2015-07-31 MED ORDER — POTASSIUM CHLORIDE CRYS ER 20 MEQ PO TBCR
40.0000 meq | EXTENDED_RELEASE_TABLET | Freq: Once | ORAL | Status: AC
  Administered 2015-07-31: 40 meq via ORAL
  Filled 2015-07-31: qty 2

## 2015-07-31 MED ORDER — FUROSEMIDE 10 MG/ML IJ SOLN
20.0000 mg | Freq: Two times a day (BID) | INTRAMUSCULAR | Status: DC
  Administered 2015-07-31 – 2015-08-01 (×3): 20 mg via INTRAVENOUS
  Filled 2015-07-31 (×2): qty 2

## 2015-07-31 NOTE — Progress Notes (Signed)
Subjective: Follow up hepatic abscess s/p drain placed 11/26 Still draining, had repeat CT yesterday  Pt does say he feels better, though now having melena and hgb dropping to 6.8 Received PRBCs last pm. Concern for GI bleed or perhaps malignancy somewhere along GI tract.  Allergies: Niaspan; Shellfish allergy; Statins; and Zetia  Medications:  Current facility-administered medications:  .  0.9 %  sodium chloride infusion, , Intravenous, Once, Domenic Polite, MD .  acetaminophen (TYLENOL) tablet 650 mg, 650 mg, Oral, Q4H PRN, Samella Parr, NP, 650 mg at 07/27/15 [id number] .  alum & mag hydroxide-simeth (MAALOX/MYLANTA) 200-200-20 MG/5ML suspension 30 mL, 30 mL, Oral, Q6H PRN, Samella Parr, NP .  bisacodyl (DULCOLAX) suppository 10 mg, 10 mg, Rectal, Daily PRN, Nat Christen, PA-C .  carvedilol (COREG) tablet 6.25 mg, 6.25 mg, Oral, BID WC, Domenic Polite, MD, 6.25 mg at 07/31/15 0841 .  finasteride (PROSCAR) tablet 5 mg, 5 mg, Oral, Daily, Samella Parr, NP, 5 mg at 07/31/15 1102 .  furosemide (LASIX) injection 20 mg, 20 mg, Intravenous, Once, Domenic Polite, MD .  furosemide (LASIX) injection 20 mg, 20 mg, Intravenous, BID, Domenic Polite, MD, 20 mg at 07/31/15 1104 .  insulin aspart (novoLOG) injection 0-15 Units, 0-15 Units, Subcutaneous, TID WC, Dianne Dun, NP, 2 Units at 07/30/15 1802 .  insulin aspart (novoLOG) injection 0-5 Units, 0-5 Units, Subcutaneous, QHS, Dianne Dun, NP, 0 Units at 07/30/15 2253 .  insulin detemir (LEVEMIR) injection 6 Units, 6 Units, Subcutaneous, Daily, Domenic Polite, MD, 6 Units at 07/31/15 1103 .  levalbuterol (XOPENEX) nebulizer solution 0.63 mg, 0.63 mg, Nebulization, Q6H PRN, Domenic Polite, MD .  morphine 2 MG/ML injection 1 mg, 1 mg, Intravenous, Q3H PRN, Kelvin Cellar, MD, 1 mg at 07/24/15 2300 .  ondansetron (ZOFRAN) tablet 4 mg, 4 mg, Oral, Q6H PRN **OR** ondansetron (ZOFRAN) injection 4 mg, 4 mg,  Intravenous, Q6H PRN, Samella Parr, NP, 4 mg at 07/25/15 [id number] .  oxyCODONE (Oxy IR/ROXICODONE) immediate release tablet 5 mg, 5 mg, Oral, Q6H PRN, Kelvin Cellar, MD, 5 mg at 07/27/15 [phone] .  pantoprazole (PROTONIX) injection 40 mg, 40 mg, Intravenous, Q12H, Domenic Polite, MD, 40 mg at 07/31/15 1103 .  piperacillin-tazobactam (ZOSYN) IVPB 3.375 g, 3.375 g, Intravenous, Q8H, Kelvin Cellar, MD, 3.375 g at 07/31/15 0841 .  sodium chloride 0.9 % injection 3 mL, 3 mL, Intravenous, Q12H, Samella Parr, NP, 3 mL at 07/30/15 2252 .  tamsulosin (FLOMAX) capsule 0.4 mg, 0.4 mg, Oral, Daily, Samella Parr, NP, 0.4 mg at 07/31/15 1103    Vital Signs: BP 145/73 mmHg  Pulse 85  Temp(Src) 98.3 F (36.8 C) (Oral)  Resp 18  Ht 5\' 11"  (1.803 m)  Wt 173 lb 4.5 oz (78.6 kg)  BMI 24.18 kg/m2  SpO2 100%  Physical Exam  Constitutional: He is oriented to person, place, and time.  Abdominal: Soft.  RUQ drain intact, site clean, NT  Output remains serosanguinous with some debris Drain flushed with 5cc NS easily, return of serosanguinous with some scant debris  Musculoskeletal: Normal range of motion.  Neurological: He is alert and oriented to person, place, and time.  Skin: Skin is warm and dry.  Nursing note and vitals reviewed.   Imaging: Ct Abdomen Pelvis W Contrast  07/30/2015  CLINICAL DATA:  Liver abscess. EXAM: CT ABDOMEN AND PELVIS WITH CONTRAST TECHNIQUE: Multidetector CT imaging of the abdomen and pelvis was performed using the standard  protocol following bolus administration of intravenous contrast. CONTRAST:  172mL OMNIPAQUE IOHEXOL 300 MG/ML  SOLN COMPARISON:  CT scan of July 24, 2015. FINDINGS: Mild multilevel degenerative disc disease is noted in the lumbar spine. Moderate right pleural effusion is noted with adjacent subsegmental atelectasis. This is increased compared to prior exam. Stable mild left pleural effusion with adjacent subsegmental atelectasis is noted. No  gallstones are noted. Abscess seen in the posterior segment of the right hepatic lobe currently measures 7.8 x 7.7 x 4.8 cm. It is not significantly changed in size or appearance compared to prior exam. Stable positioning of drainage catheter is noted. The spleen and pancreas appear normal. Stable adrenal enlargement is noted compared to prior exam. Stable bilateral renal cysts are noted stable small nonobstructive calculus seen in lower pole collecting system of left kidney. No hydronephrosis or renal obstruction is noted. No renal or ureteral calculi are noted. Atherosclerosis of abdominal aorta is noted without aneurysm formation. The appendix appears normal. There is no evidence of bowel obstruction. No abnormal fluid collection is noted. Small fat containing periumbilical hernia is noted. Sigmoid diverticulosis is noted without inflammation. Urinary bladder appears normal. Mild amount of free fluid is noted posteriorly in the pelvis which is decreased compared to prior exam. Mild anasarca is noted. No significant adenopathy is noted. IMPRESSION: Moderate right pleural effusion is noted with adjacent subsegmental atelectasis which is slightly enlarged compared to prior exam. Stable mild left pleural effusion. No change seen in size or appearance of abscess seen in posterior segment of right hepatic lobe. Stable positioning of drainage catheter is noted. Atherosclerosis of abdominal aorta is noted without aneurysm formation. Sigmoid diverticulosis is noted without inflammation. Stable small fat containing periumbilical hernia is noted. Electronically Signed   By: Marijo Conception, M.D.   On: 07/30/2015 13:48    Labs:  CBC:  Recent Labs  07/29/15 0629 07/30/15 0531 07/30/15 1908 07/31/15 1018  WBC 22.3* 16.6* 15.6* 17.4*  HGB 8.2* 6.8* 6.8* 9.2*  HCT 25.0* 19.9* 20.7* 27.5*  PLT 489* 384 394 348    COAGS:  Recent Labs  07/19/15 1625 07/21/15 0256  INR 1.51* 1.42  APTT 40* 41*     BMP:  Recent Labs  07/27/15 0644 07/28/15 0349 07/29/15 0629 07/30/15 0531  NA 152* 151* 151* 148*  K 3.5 3.6 3.8 3.2*  CL 118* 118* 117* 114*  CO2 25 24 25 26   GLUCOSE 121* 101* 110* 90  BUN 27* 26* 26* 21*  CALCIUM 8.1* 8.0* 8.2* 7.8*  CREATININE 1.31* 1.21 1.28* 1.25*  GFRNONAA 50* 55* 52* 53*  GFRAA 58* >60 60* >60    LIVER FUNCTION TESTS:  Recent Labs  07/20/15 0547 07/22/15 0307 07/24/15 0522 07/30/15 0531  BILITOT 0.7 0.6 0.9 0.7  AST 179* 66* 27 32  ALT 142* 90* 55 22  ALKPHOS 60 61 73 58  PROT 5.4* 5.4* 5.8* 5.0*  ALBUMIN 2.2* 1.9* 2.0* 1.7*    Assessment and Plan: Hepatic abscess s/p perc drain Reviewed CT with Dr. Kathlene Cote, though abscess cavity size overall essentially unchanged, fluid component is much better and likely why not much output. Cavity is mostly loculated phlegmonous material. Drain is in good position, no need for manipulation or exchange/upsize. Continue flushing. As pt gets closer to DC, will set up output follow with [place] in clinic.  SignedAscencion Dike 07/31/2015, 11:03 AM   I spent a total of 15 Minutes at the the patient's bedside AND on the patient's hospital floor  or unit, greater than 50% of which was counseling/coordinating care for hepatic abscess drain

## 2015-07-31 NOTE — Progress Notes (Signed)
Results for SINJIN, LIU (MRN [id number]) as of 07/31/2015 10:50  Ref. Range 07/30/2015 17:18 07/30/2015 21:44 07/30/2015 22:27 07/30/2015 23:39 07/31/2015 08:18  Glucose-Capillary Latest Ref Range: 65-99 mg/dL 129 (H) 56 (L) 67 114 (H) 79  Noted that CBG was 67 mg/dl last night. Recommend decreasing Novolog correction scale to SENSITIVE TID & HS.  If CBGs continue to be less than 100 mg/dl, may want to decrease Lantus to 5 units daily.  Will continue to monitor blood sugars while in the hospital. Harvel Ricks RN BSN CDE

## 2015-07-31 NOTE — Progress Notes (Signed)
Patient ID: Brian [name], male   DOB: 30-Mar-1934, 81 y.o.   MRN: [id number]    Subjective: Pt feels well today.  S/p blood transfusion.  No abdominal pain, passing flatus, tolerating a solid diet  Objective: Vital signs in last 24 hours: Temp:  [98.3 F (36.8 C)-99.9 F (37.7 C)] 98.3 F (36.8 C) (12/06 0600) Pulse Rate:  [78-88] 85 (12/06 0600) Resp:  [16-18] 18 (12/06 0600) BP: (127-145)/(65-78) 145/73 mmHg (12/06 0600) SpO2:  [98 %-100 %] 100 % (12/06 0600) Weight:  [78.6 kg (173 lb 4.5 oz)] 78.6 kg (173 lb 4.5 oz) (12/05 2100) Last BM Date: 07/28/15  Intake/Output from previous day: 12/05 0701 - 12/06 0700 In: 463 [P.O.:120; Blood:338] Out: 640 [Urine:625; Drains:15] Intake/Output this shift:    PE: Abd: soft, NT, ND, +BS, JP drain with minimal serosang output  Lab Results:   Recent Labs  07/30/15 0531 07/30/15 1908  WBC 16.6* 15.6*  HGB 6.8* 6.8*  HCT 19.9* 20.7*  PLT 384 394   BMET  Recent Labs  07/29/15 0629 07/30/15 0531  NA 151* 148*  K 3.8 3.2*  CL 117* 114*  CO2 25 26  GLUCOSE 110* 90  BUN 26* 21*  CREATININE 1.28* 1.25*  CALCIUM 8.2* 7.8*   PT/INR No results for input(s): LABPROT, INR in the last 72 hours. CMP     Component Value Date/Time   NA 148* 07/30/2015 0531   K 3.2* 07/30/2015 0531   CL 114* 07/30/2015 0531   CO2 26 07/30/2015 0531   GLUCOSE 90 07/30/2015 0531   BUN 21* 07/30/2015 0531   CREATININE 1.25* 07/30/2015 0531   CALCIUM 7.8* 07/30/2015 0531   PROT 5.0* 07/30/2015 0531   ALBUMIN 1.7* 07/30/2015 0531   AST 32 07/30/2015 0531   ALT 22 07/30/2015 0531   ALKPHOS 58 07/30/2015 0531   BILITOT 0.7 07/30/2015 0531   GFRNONAA 53* 07/30/2015 0531   GFRAA >60 07/30/2015 0531   Lipase  No results found for: LIPASE     Studies/Results: Ct Abdomen Pelvis W Contrast  07/30/2015  CLINICAL DATA:  Liver abscess. EXAM: CT ABDOMEN AND PELVIS WITH CONTRAST TECHNIQUE: Multidetector CT imaging of the abdomen and pelvis was  performed using the standard protocol following bolus administration of intravenous contrast. CONTRAST:  180mL OMNIPAQUE IOHEXOL 300 MG/ML  SOLN COMPARISON:  CT scan of July 24, 2015. FINDINGS: Mild multilevel degenerative disc disease is noted in the lumbar spine. Moderate right pleural effusion is noted with adjacent subsegmental atelectasis. This is increased compared to prior exam. Stable mild left pleural effusion with adjacent subsegmental atelectasis is noted. No gallstones are noted. Abscess seen in the posterior segment of the right hepatic lobe currently measures 7.8 x 7.7 x 4.8 cm. It is not significantly changed in size or appearance compared to prior exam. Stable positioning of drainage catheter is noted. The spleen and pancreas appear normal. Stable adrenal enlargement is noted compared to prior exam. Stable bilateral renal cysts are noted stable small nonobstructive calculus seen in lower pole collecting system of left kidney. No hydronephrosis or renal obstruction is noted. No renal or ureteral calculi are noted. Atherosclerosis of abdominal aorta is noted without aneurysm formation. The appendix appears normal. There is no evidence of bowel obstruction. No abnormal fluid collection is noted. Small fat containing periumbilical hernia is noted. Sigmoid diverticulosis is noted without inflammation. Urinary bladder appears normal. Mild amount of free fluid is noted posteriorly in the pelvis which is decreased compared to prior exam. Mild  anasarca is noted. No significant adenopathy is noted. IMPRESSION: Moderate right pleural effusion is noted with adjacent subsegmental atelectasis which is slightly enlarged compared to prior exam. Stable mild left pleural effusion. No change seen in size or appearance of abscess seen in posterior segment of right hepatic lobe. Stable positioning of drainage catheter is noted. Atherosclerosis of abdominal aorta is noted without aneurysm formation. Sigmoid  diverticulosis is noted without inflammation. Stable small fat containing periumbilical hernia is noted. Electronically Signed   By: Marijo Conception, M.D.   On: 07/30/2015 13:48    Anti-infectives: Anti-infectives    Start     Dose/Rate Route Frequency Ordered Stop   07/25/15 0000  piperacillin-tazobactam (ZOSYN) IVPB 3.375 g  Status:  Discontinued     3.375 g 12.5 mL/hr over 240 Minutes Intravenous 3 times per day 07/24/15 1705 07/24/15 1723   07/25/15 0000  piperacillin-tazobactam (ZOSYN) IVPB 3.375 g     3.375 g 12.5 mL/hr over 240 Minutes Intravenous Every 8 hours 07/24/15 1723     07/24/15 1715  piperacillin-tazobactam (ZOSYN) IVPB 3.375 g     3.375 g 100 mL/hr over 30 Minutes Intravenous  Once 07/24/15 1704 07/24/15 1838   07/21/15 1430  cefTRIAXone (ROCEPHIN) 2 g in dextrose 5 % 50 mL IVPB  Status:  Discontinued     2 g 100 mL/hr over 30 Minutes Intravenous Every 24 hours 07/20/15 1849 07/20/15 1850   07/21/15 1000  cefTRIAXone (ROCEPHIN) 2 g in dextrose 5 % 50 mL IVPB  Status:  Discontinued     2 g 100 mL/hr over 30 Minutes Intravenous Every 24 hours 07/20/15 1850 07/20/15 1855   07/20/15 1915  cefTAZidime (FORTAZ) 1 g in dextrose 5 % 50 mL IVPB  Status:  Discontinued     1 g 100 mL/hr over 30 Minutes Intravenous Every 12 hours 07/20/15 1909 07/24/15 1700   07/20/15 1645  metroNIDAZOLE (FLAGYL) IVPB 500 mg  Status:  Discontinued     500 mg 100 mL/hr over 60 Minutes Intravenous Every 8 hours 07/20/15 1630 07/24/15 1700   07/19/15 1430  doxycycline (VIBRAMYCIN) 100 mg in dextrose 5 % 250 mL IVPB  Status:  Discontinued     100 mg 125 mL/hr over 120 Minutes Intravenous Every 12 hours 07/19/15 1417 07/22/15 1034   07/19/15 1430  cefTRIAXone (ROCEPHIN) 1 g in dextrose 5 % 50 mL IVPB  Status:  Discontinued     1 g 100 mL/hr over 30 Minutes Intravenous Every 24 hours 07/19/15 1417 07/20/15 1849   07/18/15 2200  piperacillin-tazobactam (ZOSYN) IVPB 3.375 g  Status:  Discontinued      3.375 g 12.5 mL/hr over 240 Minutes Intravenous 3 times per day 07/18/15 1850 07/19/15 1415   07/18/15 1900  vancomycin (VANCOCIN) IVPB 750 mg/150 ml premix  Status:  Discontinued     750 mg 150 mL/hr over 60 Minutes Intravenous Every 24 hours 07/18/15 1850 07/20/15 1630   07/18/15 1300  cefTRIAXone (ROCEPHIN) 1 g in dextrose 5 % 50 mL IVPB  Status:  Discontinued     1 g 100 mL/hr over 30 Minutes Intravenous Every 24 hours 07/18/15 1241 07/18/15 1834       Assessment/Plan  1. Liver abscess, s/p perc drain -stable on CT scan.  Abscess appears somewhat loculated.  After review with Dr. Hulen Skains, we do not feel upsizing the drain would make any difference.  Cont drain and defer further care to IR -cont medical management of abscess with abx therapy.  No  surgical intervention warranted. -will need outpatient colonoscopy to help possibly identify a source. 2. Ileus -resolved.   No further surgical interventions or needs.  We will sign off.  LOS: 13 days    Grace Haggart E 07/31/2015, 7:59 AM Pager: [phone]

## 2015-07-31 NOTE — Consult Note (Signed)
Reason for Consult: Anemia guaiac positivity Referring Physician: Hospital team  Brian [name] is an 81 y.o. male.  HPI: Patient seen and examined and hospital computer chart was reviewed and his case discussed with his wife as well and he was admitted for liver abscess without any other GI complaints and supposedly a colonoscopy years ago showing diverticuli but he has not had any other GI complaints and has not seen any blood at home although yesterday his stools were dark and guaiac positive and he was on some blood thinners which were stopped but he also had a little blood in history new to bag as well and his CTs were reviewed and he has no other complaints and is tolerating a regular diet  Past Medical History  Diagnosis Date  . Type 2 diabetes mellitus (HCC)   . Hypertension   . Hyperlipidemia   . Bladder stone   . History of bladder stone   . Nocturia   . Renal disorder   . Arthritis     Past Surgical History  Procedure Laterality Date  . Cystoscopy/retrograde/ureteroscopy/stone extraction with basket N/A 03/22/2013    Procedure: CYSTOSCOPY BLADDER STONE STONE EXTRACTION;  Surgeon: Lindaann Slough, MD;  Location: [facility] [place];  Service: Urology;  Laterality: N/A;  CYSTOSCOPY    . Tonsillectomy      Family History  Problem Relation Age of Onset  . Congestive Heart Failure      Social History:  reports that he quit smoking about 22 years ago. His smoking use included Cigarettes. He has a 29.25 pack-year smoking history. He does not have any smokeless tobacco history on file. He reports that he drinks alcohol. He reports that he does not use illicit drugs.  Allergies:  Allergies  Allergen Reactions  . Niaspan [Niacin Er]   . Shellfish Allergy Other (See Comments)    Pt vomits  . Statins Other (See Comments)    Myalgia  . Zetia [Ezetimibe]     Medications: I have reviewed the patient's current medications.  Results for orders placed or performed  during the hospital encounter of 07/18/15 (from the past 48 hour(s))  Glucose, capillary     Status: Abnormal   Collection Time: 07/29/15  4:58 PM  Result Value Ref Range   Glucose-Capillary 115 (H) 65 - 99 mg/dL  Glucose, capillary     Status: Abnormal   Collection Time: 07/29/15 10:02 PM  Result Value Ref Range   Glucose-Capillary 125 (H) 65 - 99 mg/dL  CBC     Status: Abnormal   Collection Time: 07/30/15  5:31 AM  Result Value Ref Range   WBC 16.6 (H) 4.0 - 10.5 K/uL   RBC 2.33 (L) 4.22 - 5.81 MIL/uL   Hemoglobin 6.8 (LL) 13.0 - 17.0 g/dL    Comment: REPEATED TO VERIFY CRITICAL RESULT CALLED TO, READ BACK BY AND VERIFIED WITH: CHANEY,T RN 0741 12.5.16 MCADOO,G    HCT 19.9 (L) 39.0 - 52.0 %   MCV 85.4 78.0 - 100.0 fL   MCH 29.2 26.0 - 34.0 pg   MCHC 34.2 30.0 - 36.0 g/dL   RDW 85.6 56.3 - 71.9 %   Platelets 384 150 - 400 K/uL  Comprehensive metabolic panel     Status: Abnormal   Collection Time: 07/30/15  5:31 AM  Result Value Ref Range   Sodium 148 (H) 135 - 145 mmol/L   Potassium 3.2 (L) 3.5 - 5.1 mmol/L   Chloride 114 (H) 101 - 111 mmol/L  CO2 26 22 - 32 mmol/L   Glucose, Bld 90 65 - 99 mg/dL   BUN 21 (H) 6 - 20 mg/dL   Creatinine, Ser 0.75 (H) 0.61 - 1.24 mg/dL   Calcium 7.8 (L) 8.9 - 10.3 mg/dL   Total Protein 5.0 (L) 6.5 - 8.1 g/dL   Albumin 1.7 (L) 3.5 - 5.0 g/dL   AST 32 15 - 41 U/L   ALT 22 17 - 63 U/L   Alkaline Phosphatase 58 38 - 126 U/L   Total Bilirubin 0.7 0.3 - 1.2 mg/dL   GFR calc non Af Amer 53 (L) >60 mL/min   GFR calc Af Amer >60 >60 mL/min    Comment: (NOTE) The eGFR has been calculated using the CKD EPI equation. This calculation has not been validated in all clinical situations. eGFR's persistently <60 mL/min signify possible Chronic Kidney Disease.    Anion gap 8 5 - 15  Glucose, capillary     Status: None   Collection Time: 07/30/15  8:24 AM  Result Value Ref Range   Glucose-Capillary 98 65 - 99 mg/dL   Comment 1 Notify RN   Glucose,  capillary     Status: Abnormal   Collection Time: 07/30/15 12:10 PM  Result Value Ref Range   Glucose-Capillary 114 (H) 65 - 99 mg/dL   Comment 1 Notify RN   Prepare RBC     Status: None   Collection Time: 07/30/15  2:08 PM  Result Value Ref Range   Order Confirmation ORDER PROCESSED BY BLOOD BANK   Type and screen [facility]     Status: None (Preliminary result)   Collection Time: 07/30/15  2:08 PM  Result Value Ref Range   ABO/RH(D) O POS    Antibody Screen NEG    Sample Expiration 08/02/2015    Unit Number [id number]    Blood Component Type RBC LR PHER1    Unit division 00    Status of Unit ISSUED    Transfusion Status OK TO TRANSFUSE    Crossmatch Result Compatible    Unit Number [id number]    Blood Component Type RBC LR PHER1    Unit division 00    Status of Unit ISSUED    Transfusion Status OK TO TRANSFUSE    Crossmatch Result Compatible   ABO/Rh     Status: None   Collection Time: 07/30/15  2:08 PM  Result Value Ref Range   ABO/RH(D) O POS   Glucose, capillary     Status: Abnormal   Collection Time: 07/30/15  5:18 PM  Result Value Ref Range   Glucose-Capillary 129 (H) 65 - 99 mg/dL  CBC     Status: Abnormal   Collection Time: 07/30/15  7:08 PM  Result Value Ref Range   WBC 15.6 (H) 4.0 - 10.5 K/uL   RBC 2.41 (L) 4.22 - 5.81 MIL/uL   Hemoglobin 6.8 (LL) 13.0 - 17.0 g/dL    Comment: REPEATED TO VERIFY CRITICAL RESULT CALLED TO, READ BACK BY AND VERIFIED WITH: Elsie Stain RN [phone] GREEN R    HCT 20.7 (L) 39.0 - 52.0 %   MCV 85.9 78.0 - 100.0 fL   MCH 28.2 26.0 - 34.0 pg   MCHC 32.9 30.0 - 36.0 g/dL   RDW 50.8 13.6 - 53.1 %   Platelets 394 150 - 400 K/uL  Glucose, capillary     Status: Abnormal   Collection Time: 07/30/15  9:44 PM  Result Value Ref Range  Glucose-Capillary 56 (L) 65 - 99 mg/dL  Glucose, capillary     Status: None   Collection Time: 07/30/15 10:27 PM  Result Value Ref Range   Glucose-Capillary 67 65 - 99 mg/dL   Glucose, capillary     Status: Abnormal   Collection Time: 07/30/15 11:39 PM  Result Value Ref Range   Glucose-Capillary 114 (H) 65 - 99 mg/dL  Glucose, capillary     Status: None   Collection Time: 07/31/15  8:18 AM  Result Value Ref Range   Glucose-Capillary 79 65 - 99 mg/dL   Comment 1 Notify RN    Comment 2 Document in Chart   CBC     Status: Abnormal   Collection Time: 07/31/15 10:18 AM  Result Value Ref Range   WBC 17.4 (H) 4.0 - 10.5 K/uL   RBC 3.24 (L) 4.22 - 5.81 MIL/uL   Hemoglobin 9.2 (L) 13.0 - 17.0 g/dL    Comment: POST TRANSFUSION SPECIMEN DELTA CHECK NOTED    HCT 27.5 (L) 39.0 - 52.0 %   MCV 84.9 78.0 - 100.0 fL   MCH 28.4 26.0 - 34.0 pg   MCHC 33.5 30.0 - 36.0 g/dL   RDW 15.5 11.5 - 15.5 %   Platelets 348 150 - 400 K/uL  Comprehensive metabolic panel     Status: Abnormal   Collection Time: 07/31/15 10:18 AM  Result Value Ref Range   Sodium 144 135 - 145 mmol/L   Potassium 3.4 (L) 3.5 - 5.1 mmol/L   Chloride 110 101 - 111 mmol/L   CO2 25 22 - 32 mmol/L   Glucose, Bld 100 (H) 65 - 99 mg/dL   BUN 14 6 - 20 mg/dL   Creatinine, Ser 1.22 0.61 - 1.24 mg/dL   Calcium 7.9 (L) 8.9 - 10.3 mg/dL   Total Protein 5.3 (L) 6.5 - 8.1 g/dL   Albumin 1.8 (L) 3.5 - 5.0 g/dL   AST 29 15 - 41 U/L   ALT 22 17 - 63 U/L   Alkaline Phosphatase 58 38 - 126 U/L   Total Bilirubin 1.1 0.3 - 1.2 mg/dL   GFR calc non Af Amer 55 (L) >60 mL/min   GFR calc Af Amer >60 >60 mL/min    Comment: (NOTE) The eGFR has been calculated using the CKD EPI equation. This calculation has not been validated in all clinical situations. eGFR's persistently <60 mL/min signify possible Chronic Kidney Disease.    Anion gap 9 5 - 15  Glucose, capillary     Status: Abnormal   Collection Time: 07/31/15 11:26 AM  Result Value Ref Range   Glucose-Capillary 119 (H) 65 - 99 mg/dL    Ct Abdomen Pelvis W Contrast  07/30/2015  CLINICAL DATA:  Liver abscess. EXAM: CT ABDOMEN AND PELVIS WITH CONTRAST  TECHNIQUE: Multidetector CT imaging of the abdomen and pelvis was performed using the standard protocol following bolus administration of intravenous contrast. CONTRAST:  138mL OMNIPAQUE IOHEXOL 300 MG/ML  SOLN COMPARISON:  CT scan of July 24, 2015. FINDINGS: Mild multilevel degenerative disc disease is noted in the lumbar spine. Moderate right pleural effusion is noted with adjacent subsegmental atelectasis. This is increased compared to prior exam. Stable mild left pleural effusion with adjacent subsegmental atelectasis is noted. No gallstones are noted. Abscess seen in the posterior segment of the right hepatic lobe currently measures 7.8 x 7.7 x 4.8 cm. It is not significantly changed in size or appearance compared to prior exam. Stable positioning of drainage catheter is  noted. The spleen and pancreas appear normal. Stable adrenal enlargement is noted compared to prior exam. Stable bilateral renal cysts are noted stable small nonobstructive calculus seen in lower pole collecting system of left kidney. No hydronephrosis or renal obstruction is noted. No renal or ureteral calculi are noted. Atherosclerosis of abdominal aorta is noted without aneurysm formation. The appendix appears normal. There is no evidence of bowel obstruction. No abnormal fluid collection is noted. Small fat containing periumbilical hernia is noted. Sigmoid diverticulosis is noted without inflammation. Urinary bladder appears normal. Mild amount of free fluid is noted posteriorly in the pelvis which is decreased compared to prior exam. Mild anasarca is noted. No significant adenopathy is noted. IMPRESSION: Moderate right pleural effusion is noted with adjacent subsegmental atelectasis which is slightly enlarged compared to prior exam. Stable mild left pleural effusion. No change seen in size or appearance of abscess seen in posterior segment of right hepatic lobe. Stable positioning of drainage catheter is noted. Atherosclerosis of  abdominal aorta is noted without aneurysm formation. Sigmoid diverticulosis is noted without inflammation. Stable small fat containing periumbilical hernia is noted. Electronically Signed   By: Marijo Conception, M.D.   On: 07/30/2015 13:48    ROS negative except above Blood pressure 138/80, pulse 80, temperature 98.1 F (36.7 C), temperature source Oral, resp. rate 18, height $RemoveBe'5\' 11"'wCpsDuajP$  (1.803 m), weight 78.6 kg (173 lb 4.5 oz), SpO2 100 %. Physical Exam Vital signs stable afebrile no acute distress exam pertinent for the majority of his abdomen being soft nontender labs and CT reviewed Assessment/Plan: Multiple medical problems including episodic guaiac positivity and a small hemoglobin drop Plan: Will follow for now if he continues to have signs of bleeding happy to proceed with GI workup while here otherwise could consider repeat workup as an outpatient once liver abscess is cured and as an aside the most likely causes are gallbladder or diverticuli and will follow with you for now and please call us sooner if any question or problem  Lorenza Winkleman E 07/31/2015, 3:32 PM

## 2015-07-31 NOTE — Progress Notes (Signed)
[facility] PROGRESS NOTE  Brian [name] [id number] DOB: 1934-02-26 DOA: 07/18/2015 PCP: [name], MD  Interim summary Brian [name] is a 81 year old gentleman with a past medical history of diabetes mellitus, admitted to the medicine service on 07/18/2015 when he presented with complaints of generalized weakness, dizziness, chills.  He was found to be febrile having a temperature of 103 and started on broad-spectrum IV antimicrobial therapy with vancomycin and Zosyn.  CT scan of abdomen and pelvis performed on 07/20/2015 showed a hypoattenuating lesion within the liver. This was further worked up with an MRI of liver that showed findings compatible with abscess collection. Interventional radiology was consulted for drain placement that was performed on 07/21/2015. Blood cultures obtained on admission growing 1/4 with Prevotella, with repeat blood cultures show no growth to date. Cultures from abscess growing gram-positive cocci.  Awaiting organism identification and susceptibility testing. Hospital course complicated by ileus/PSBO, treated conservatively with NG decompression, bowel rest, supportive care. CCS consulting, ileus resolved now. -however without much output from liver abscess drain and persistent leukocytosis,  12/5: Repeat CT : liver abscess unchanged -12/5 with drop in hb to 6.8 and melena, SQ lovenox stopped, Iv PPI, 2 units PRBC given  -12/6: [facility] consulted ID consutling too  Assessment/Plan: 1. Severe sepsis/Liver abscess -On 07/20/2015: CT scan of abdomen and pelvis performed revealing 7.1 x 5.0 x 5.7 cm hypoattenuating lesion within the liver. Radiology reporting that this could represent an abscess vs primary hepatic malignancy. This was further worked up with MRI of liver that demonstrated findings compatible with abscess collection.    -On 07/21/2015 blood cultures drawn from 07/18/2015 growing 1/4 with Prevotella. Ceftriaxone changed to Fortaz 1 g IV  every 12 hours and Vancomycin discontinued. -11/26  Interventional radiology placed drain catheter of liver abscess . Gram stain showing gram positive cocci in pairs from abscess, but culture no growth 2 days -Dr. Baxter Flattery of infectious disease, consulting -WBC slightly improved, remains on IV Zosyn -i think he will need a colonoscopy down the road, wife reports last one >5-10years ago -reportedly normal -repeat Ct 12/5 without change in size of abscess, CCS has no further recs, d/w IR, felt that fluid in collection slightly better and may take a while to improve and recommended FU in [place] clinic.   2.  Ileus -Overnight 11/29, patient developed multiple episodes of nausea and vomiting and noted to have distended abdomen,  abdominal x-ray that showed adynamic ileus. -s/p NG decompression, improving, NG out now -tolerating diet -appreciate CCS consult  3. Acute blood loss anemia -Melena x2days and s/p 2 units PRBC last pm 12/5,  -continue Iv PPI q12, stopped SQ lovenox -[facility] consulted -monitor Hb    Acute on chronic renal failure -Likely secondary to ATN in setting of hypotension or perhaps sepsis/critical illness -baseline between 1.4 1.7,on admit creatinine of 2.85 with BUN of 36 -stopped IVF, starting to have some leg swelling in last 24h -lasix started 12/5  5.  Elevated troponin. -Lab work showing upward trend in his troponin to 0.30, no chest pain. -related to demand ischemia in setting of critical illness, Troponins trending down.  6. Diabetes mellitus -continue lantus,SSI  7.  History of hypertension. -He presented with hypotension for which antihypertensive medications were discontinued  8. Chronic diastolic CHF with volume overload which is iatrogenic -pleural effusion -IV lasix, monitor I/O   DVT prophylaxis. Lovenox  Code Status: full code Family Communication: daughter at bedside Disposition Plan: pending improvement in melena and  abscesses  Antibiotics:  Vancomycin started on 07/18/2015 stopped on 07/20/2015  Zosyn discontinued on 07/19/2015  Ceftriaxone started on 07/19/2015 stopped on 07/20/2015   Doxycycline started on 07/19/2015   [name] started on 07/20/2015  HPI/Subjective: Feels ok, still with black stools, no N/V, breathing ok  Objective: Filed Vitals:   07/31/15 0600 07/31/15 0835  BP: 145/73 138/80  Pulse: 85 80  Temp: 98.3 F (36.8 C) 98.1 F (36.7 C)  Resp: 18 18    Intake/Output Summary (Last 24 hours) at 07/31/15 1253 Last data filed at 07/31/15 0835  Gross per 24 hour  Intake    618 ml  Output    515 ml  Net    103 ml   Filed Weights   07/26/15 2001 07/28/15 2217 07/30/15 2100  Weight: 83.4 kg (183 lb 13.8 oz) 78.1 kg (172 lb 2.9 oz) 78.6 kg (173 lb 4.5 oz)    Exam:   General:  ill-appearing, AAOx3, no distress.   Cardiovascular: regular rate and rhythm normal S1-S2  Respiratory: decreased Bs at bases Abdomen: Abdomen is less distended, tympanic, non tender, diminished BS, NG out Musculoskeletal:1-2+ ankle edema  Data Reviewed: Basic Metabolic Panel:  Recent Labs Lab 07/27/15 0644 07/28/15 0349 07/29/15 0629 07/30/15 0531 07/31/15 1018  NA 152* 151* 151* 148* 144  K 3.5 3.6 3.8 3.2* 3.4*  CL 118* 118* 117* 114* 110  CO2 25 24 25 26 25   GLUCOSE 121* 101* 110* 90 100*  BUN 27* 26* 26* 21* 14  CREATININE 1.31* 1.21 1.28* 1.25* 1.22  CALCIUM 8.1* 8.0* 8.2* 7.8* 7.9*   Liver Function Tests:  Recent Labs Lab 07/30/15 0531 07/31/15 1018  AST 32 29  ALT 22 22  ALKPHOS 58 58  BILITOT 0.7 1.1  PROT 5.0* 5.3*  ALBUMIN 1.7* 1.8*   No results for input(s): LIPASE, AMYLASE in the last 168 hours. No results for input(s): AMMONIA in the last 168 hours. CBC:  Recent Labs Lab 07/28/15 0349 07/29/15 0629 07/30/15 0531 07/30/15 1908 07/31/15 1018  WBC 21.9* 22.3* 16.6* 15.6* 17.4*  HGB 8.4* 8.2* 6.8* 6.8* 9.2*  HCT 25.5* 25.0* 19.9* 20.7* 27.5*  MCV  85.0 85.6 85.4 85.9 84.9  PLT 502* 489* 384 394 348   Cardiac Enzymes: No results for input(s): CKTOTAL, CKMB, CKMBINDEX, TROPONINI in the last 168 hours. BNP (last 3 results) No results for input(s): BNP in the last 8760 hours.  ProBNP (last 3 results) No results for input(s): PROBNP in the last 8760 hours.  CBG:  Recent Labs Lab 07/30/15 2144 07/30/15 2227 07/30/15 2339 07/31/15 0818 07/31/15 1126  GLUCAP 56* 67 114* 79 119*    Recent Results (from the past 240 hour(s))  Culture, routine-abscess     Status: None   Collection Time: 07/21/15  2:42 PM  Result Value Ref Range Status   Specimen Description ABSCESS LIVER  Final   Special Requests NONE  Final   Gram Stain   Final    ABUNDANT WBC PRESENT, PREDOMINANTLY PMN NO SQUAMOUS EPITHELIAL CELLS SEEN MODERATE GRAM POSITIVE COCCI IN PAIRS Performed at [facility]    Culture   Final    NO GROWTH 2 DAYS Performed at [facility]    Report Status 07/26/2015 FINAL  Final     Studies: Ct Abdomen Pelvis W Contrast  07/30/2015  CLINICAL DATA:  Liver abscess. EXAM: CT ABDOMEN AND PELVIS WITH CONTRAST TECHNIQUE: Multidetector CT imaging of the abdomen and pelvis was performed using the standard protocol following bolus  administration of intravenous contrast. CONTRAST:  184mL OMNIPAQUE IOHEXOL 300 MG/ML  SOLN COMPARISON:  CT scan of July 24, 2015. FINDINGS: Mild multilevel degenerative disc disease is noted in the lumbar spine. Moderate right pleural effusion is noted with adjacent subsegmental atelectasis. This is increased compared to prior exam. Stable mild left pleural effusion with adjacent subsegmental atelectasis is noted. No gallstones are noted. Abscess seen in the posterior segment of the right hepatic lobe currently measures 7.8 x 7.7 x 4.8 cm. It is not significantly changed in size or appearance compared to prior exam. Stable positioning of drainage catheter is noted. The spleen and pancreas appear  normal. Stable adrenal enlargement is noted compared to prior exam. Stable bilateral renal cysts are noted stable small nonobstructive calculus seen in lower pole collecting system of left kidney. No hydronephrosis or renal obstruction is noted. No renal or ureteral calculi are noted. Atherosclerosis of abdominal aorta is noted without aneurysm formation. The appendix appears normal. There is no evidence of bowel obstruction. No abnormal fluid collection is noted. Small fat containing periumbilical hernia is noted. Sigmoid diverticulosis is noted without inflammation. Urinary bladder appears normal. Mild amount of free fluid is noted posteriorly in the pelvis which is decreased compared to prior exam. Mild anasarca is noted. No significant adenopathy is noted. IMPRESSION: Moderate right pleural effusion is noted with adjacent subsegmental atelectasis which is slightly enlarged compared to prior exam. Stable mild left pleural effusion. No change seen in size or appearance of abscess seen in posterior segment of right hepatic lobe. Stable positioning of drainage catheter is noted. Atherosclerosis of abdominal aorta is noted without aneurysm formation. Sigmoid diverticulosis is noted without inflammation. Stable small fat containing periumbilical hernia is noted. Electronically Signed   By: Marijo Conception, M.D.   On: 07/30/2015 13:48    Scheduled Meds: . sodium chloride   Intravenous Once  . carvedilol  6.25 mg Oral BID WC  . finasteride  5 mg Oral Daily  . furosemide  20 mg Intravenous Once  . furosemide  20 mg Intravenous BID  . insulin aspart  0-15 Units Subcutaneous TID WC  . insulin aspart  0-5 Units Subcutaneous QHS  . insulin detemir  6 Units Subcutaneous Daily  . pantoprazole (PROTONIX) IV  40 mg Intravenous Q12H  . piperacillin-tazobactam (ZOSYN)  IV  3.375 g Intravenous Q8H  . sodium chloride  3 mL Intravenous Q12H  . tamsulosin  0.4 mg Oral Daily   Continuous Infusions:    Principal  Problem:   Sepsis ([place]) Active Problems:   Hypertension goal BP (blood pressure) < 140/90   Hyperlipidemia LDL goal <100   Chronic kidney disease (CKD), stage III (moderate)   Anemia of chronic renal failure   Acute renal failure (ARF) (HCC)   Transaminitis   Diabetes mellitus, insulin dependent (IDDM), uncontrolled (HCC)   Leukocytosis   Acute kidney injury ([place])   SOB (shortness of breath)   Liver abscess   Fever and chills   Gram-negative bacteremia (HCC)   Abdominal distention   Ileus (HCC)   Abscess, liver    Time spent: 35 min    Karon Heckendorn  [facility] Pager [phone] If 7PM-7AM, please contact night-coverage at [email or web address], password [facility] 07/31/2015, 12:53 PM  LOS: 13 days

## 2015-07-31 NOTE — Progress Notes (Addendum)
INFECTIOUS DISEASE PROGRESS NOTE  ID: Brian [name] is a 81 y.o. male with  Principal Problem:   Sepsis ([place]) Active Problems:   Hypertension goal BP (blood pressure) < 140/90   Hyperlipidemia LDL goal <100   Chronic kidney disease (CKD), stage III (moderate)   Anemia of chronic renal failure   Acute renal failure (ARF) (HCC)   Transaminitis   Diabetes mellitus, insulin dependent (IDDM), uncontrolled (HCC)   Leukocytosis   Acute kidney injury ([place])   SOB (shortness of breath)   Liver abscess   Fever and chills   Gram-negative bacteremia (HCC)   Abdominal distention   Ileus (HCC)   Abscess, liver  Subjective: Without complaints  Abtx:  Anti-infectives    Start     Dose/Rate Route Frequency Ordered Stop   07/25/15 0000  piperacillin-tazobactam (ZOSYN) IVPB 3.375 g  Status:  Discontinued     3.375 g 12.5 mL/hr over 240 Minutes Intravenous 3 times per day 07/24/15 1705 07/24/15 1723   07/25/15 0000  piperacillin-tazobactam (ZOSYN) IVPB 3.375 g     3.375 g 12.5 mL/hr over 240 Minutes Intravenous Every 8 hours 07/24/15 1723     07/24/15 1715  piperacillin-tazobactam (ZOSYN) IVPB 3.375 g     3.375 g 100 mL/hr over 30 Minutes Intravenous  Once 07/24/15 1704 07/24/15 1838   07/21/15 1430  cefTRIAXone (ROCEPHIN) 2 g in dextrose 5 % 50 mL IVPB  Status:  Discontinued     2 g 100 mL/hr over 30 Minutes Intravenous Every 24 hours 07/20/15 1849 07/20/15 1850   07/21/15 1000  cefTRIAXone (ROCEPHIN) 2 g in dextrose 5 % 50 mL IVPB  Status:  Discontinued     2 g 100 mL/hr over 30 Minutes Intravenous Every 24 hours 07/20/15 1850 07/20/15 1855   07/20/15 1915  cefTAZidime (FORTAZ) 1 g in dextrose 5 % 50 mL IVPB  Status:  Discontinued     1 g 100 mL/hr over 30 Minutes Intravenous Every 12 hours 07/20/15 1909 07/24/15 1700   07/20/15 1645  metroNIDAZOLE (FLAGYL) IVPB 500 mg  Status:  Discontinued     500 mg 100 mL/hr over 60 Minutes Intravenous Every 8 hours 07/20/15 1630 07/24/15  1700   07/19/15 1430  doxycycline (VIBRAMYCIN) 100 mg in dextrose 5 % 250 mL IVPB  Status:  Discontinued     100 mg 125 mL/hr over 120 Minutes Intravenous Every 12 hours 07/19/15 1417 07/22/15 1034   07/19/15 1430  cefTRIAXone (ROCEPHIN) 1 g in dextrose 5 % 50 mL IVPB  Status:  Discontinued     1 g 100 mL/hr over 30 Minutes Intravenous Every 24 hours 07/19/15 1417 07/20/15 1849   07/18/15 2200  piperacillin-tazobactam (ZOSYN) IVPB 3.375 g  Status:  Discontinued     3.375 g 12.5 mL/hr over 240 Minutes Intravenous 3 times per day 07/18/15 1850 07/19/15 1415   07/18/15 1900  vancomycin (VANCOCIN) IVPB 750 mg/150 ml premix  Status:  Discontinued     750 mg 150 mL/hr over 60 Minutes Intravenous Every 24 hours 07/18/15 1850 07/20/15 1630   07/18/15 1300  cefTRIAXone (ROCEPHIN) 1 g in dextrose 5 % 50 mL IVPB  Status:  Discontinued     1 g 100 mL/hr over 30 Minutes Intravenous Every 24 hours 07/18/15 1241 07/18/15 1834      Medications:  Scheduled: . sodium chloride   Intravenous Once  . carvedilol  6.25 mg Oral BID WC  . finasteride  5 mg Oral Daily  . furosemide  20 mg Intravenous Once  . furosemide  20 mg Intravenous BID  . insulin aspart  0-15 Units Subcutaneous TID WC  . insulin aspart  0-5 Units Subcutaneous QHS  . insulin detemir  6 Units Subcutaneous Daily  . pantoprazole (PROTONIX) IV  40 mg Intravenous Q12H  . piperacillin-tazobactam (ZOSYN)  IV  3.375 g Intravenous Q8H  . sodium chloride  3 mL Intravenous Q12H  . tamsulosin  0.4 mg Oral Daily    Objective: Vital signs in last 24 hours: Temp:  [98.1 F (36.7 C)-99.9 F (37.7 C)] 98.1 F (36.7 C) (12/06 0835) Pulse Rate:  [78-88] 80 (12/06 0835) Resp:  [16-18] 18 (12/06 0835) BP: (127-145)/(65-80) 138/80 mmHg (12/06 0835) SpO2:  [98 %-100 %] 100 % (12/06 0835) Weight:  [78.6 kg (173 lb 4.5 oz)] 78.6 kg (173 lb 4.5 oz) (12/05 2100)   General appearance: alert, cooperative and no distress GI: normal findings: bowel  sounds normal and soft, non-tender and abnormal findings:  distended Extremities: edema 3+  Lab Results  Recent Labs  07/30/15 0531 07/30/15 1908 07/31/15 1018  WBC 16.6* 15.6* 17.4*  HGB 6.8* 6.8* 9.2*  HCT 19.9* 20.7* 27.5*  NA 148*  --  144  K 3.2*  --  3.4*  CL 114*  --  110  CO2 26  --  25  BUN 21*  --  14  CREATININE 1.25*  --  1.22   Liver Panel  Recent Labs  07/30/15 0531 07/31/15 1018  PROT 5.0* 5.3*  ALBUMIN 1.7* 1.8*  AST 32 29  ALT 22 22  ALKPHOS 58 58  BILITOT 0.7 1.1   Sedimentation Rate No results for input(s): ESRSEDRATE in the last 72 hours. C-Reactive Protein No results for input(s): CRP in the last 72 hours.  Microbiology: Recent Results (from the past 240 hour(s))  Culture, routine-abscess     Status: None   Collection Time: 07/21/15  2:42 PM  Result Value Ref Range Status   Specimen Description ABSCESS LIVER  Final   Special Requests NONE  Final   Gram Stain   Final    ABUNDANT WBC PRESENT, PREDOMINANTLY PMN NO SQUAMOUS EPITHELIAL CELLS SEEN MODERATE GRAM POSITIVE COCCI IN PAIRS Performed at [facility]    Culture   Final    NO GROWTH 2 DAYS Performed at [facility]    Report Status 07/26/2015 FINAL  Final    Studies/Results: Ct Abdomen Pelvis W Contrast  07/30/2015  CLINICAL DATA:  Liver abscess. EXAM: CT ABDOMEN AND PELVIS WITH CONTRAST TECHNIQUE: Multidetector CT imaging of the abdomen and pelvis was performed using the standard protocol following bolus administration of intravenous contrast. CONTRAST:  190mL OMNIPAQUE IOHEXOL 300 MG/ML  SOLN COMPARISON:  CT scan of July 24, 2015. FINDINGS: Mild multilevel degenerative disc disease is noted in the lumbar spine. Moderate right pleural effusion is noted with adjacent subsegmental atelectasis. This is increased compared to prior exam. Stable mild left pleural effusion with adjacent subsegmental atelectasis is noted. No gallstones are noted. Abscess seen in the  posterior segment of the right hepatic lobe currently measures 7.8 x 7.7 x 4.8 cm. It is not significantly changed in size or appearance compared to prior exam. Stable positioning of drainage catheter is noted. The spleen and pancreas appear normal. Stable adrenal enlargement is noted compared to prior exam. Stable bilateral renal cysts are noted stable small nonobstructive calculus seen in lower pole collecting system of left kidney. No hydronephrosis or renal obstruction is noted. No  renal or ureteral calculi are noted. Atherosclerosis of abdominal aorta is noted without aneurysm formation. The appendix appears normal. There is no evidence of bowel obstruction. No abnormal fluid collection is noted. Small fat containing periumbilical hernia is noted. Sigmoid diverticulosis is noted without inflammation. Urinary bladder appears normal. Mild amount of free fluid is noted posteriorly in the pelvis which is decreased compared to prior exam. Mild anasarca is noted. No significant adenopathy is noted. IMPRESSION: Moderate right pleural effusion is noted with adjacent subsegmental atelectasis which is slightly enlarged compared to prior exam. Stable mild left pleural effusion. No change seen in size or appearance of abscess seen in posterior segment of right hepatic lobe. Stable positioning of drainage catheter is noted. Atherosclerosis of abdominal aorta is noted without aneurysm formation. Sigmoid diverticulosis is noted without inflammation. Stable small fat containing periumbilical hernia is noted. Electronically Signed   By: Marijo Conception, M.D.   On: 07/30/2015 13:48     Assessment/Plan: Liver abscess GNR bacteremia (1/4 prevotella. Liver abscess Cx GPC on stain) Ileus arf Protein calorie malnutrition  Total days of antibiotics: 11 zosyn  Surgery and IR notes seen.  Would aim for 21 days of zosyn (or invanz) He can then f/u in ID clinic and will need repeat CT scan. Explained to pt and family  Cr  stable WBC fairly stable Need to f/u ileus prior to d/c.  Available as needed.          Bobby Rumpf Infectious Diseases (pager) [phone] [email or web address] 07/31/2015, 1:10 PM  LOS: 13 days

## 2015-08-01 LAB — TYPE AND SCREEN
ABO/RH(D): O POS
ANTIBODY SCREEN: NEGATIVE
UNIT DIVISION: 0
UNIT DIVISION: 0

## 2015-08-01 LAB — GLUCOSE, CAPILLARY
GLUCOSE-CAPILLARY: 100 mg/dL — AB (ref 65–99)
GLUCOSE-CAPILLARY: 103 mg/dL — AB (ref 65–99)
GLUCOSE-CAPILLARY: 109 mg/dL — AB (ref 65–99)
Glucose-Capillary: 136 mg/dL — ABNORMAL HIGH (ref 65–99)

## 2015-08-01 LAB — CBC
HCT: 27 % — ABNORMAL LOW (ref 39.0–52.0)
HEMOGLOBIN: 9.3 g/dL — AB (ref 13.0–17.0)
MCH: 29.2 pg (ref 26.0–34.0)
MCHC: 34.4 g/dL (ref 30.0–36.0)
MCV: 84.9 fL (ref 78.0–100.0)
Platelets: 344 10*3/uL (ref 150–400)
RBC: 3.18 MIL/uL — ABNORMAL LOW (ref 4.22–5.81)
RDW: 16.1 % — AB (ref 11.5–15.5)
WBC: 15.8 10*3/uL — ABNORMAL HIGH (ref 4.0–10.5)

## 2015-08-01 LAB — BASIC METABOLIC PANEL
ANION GAP: 9 (ref 5–15)
BUN: 13 mg/dL (ref 6–20)
CALCIUM: 8 mg/dL — AB (ref 8.9–10.3)
CO2: 26 mmol/L (ref 22–32)
CREATININE: 1.32 mg/dL — AB (ref 0.61–1.24)
Chloride: 107 mmol/L (ref 101–111)
GFR calc Af Amer: 58 mL/min — ABNORMAL LOW (ref >60)
GFR calc non Af Amer: 50 mL/min — ABNORMAL LOW (ref >60)
GLUCOSE: 90 mg/dL (ref 65–99)
Potassium: 3.4 mmol/L — ABNORMAL LOW (ref 3.5–5.1)
Sodium: 142 mmol/L (ref 135–145)

## 2015-08-01 MED ORDER — FUROSEMIDE 10 MG/ML IJ SOLN
20.0000 mg | Freq: Every day | INTRAMUSCULAR | Status: DC
  Administered 2015-08-02: 20 mg via INTRAVENOUS
  Filled 2015-08-01: qty 2

## 2015-08-01 MED ORDER — POTASSIUM CHLORIDE CRYS ER 20 MEQ PO TBCR
40.0000 meq | EXTENDED_RELEASE_TABLET | Freq: Once | ORAL | Status: AC
  Administered 2015-08-01: 40 meq via ORAL
  Filled 2015-08-01: qty 2

## 2015-08-01 MED ORDER — PANTOPRAZOLE SODIUM 40 MG PO TBEC
40.0000 mg | DELAYED_RELEASE_TABLET | Freq: Two times a day (BID) | ORAL | Status: DC
  Administered 2015-08-01 – 2015-08-04 (×7): 40 mg via ORAL
  Filled 2015-08-01 (×7): qty 1

## 2015-08-01 NOTE — Progress Notes (Signed)
ANTIBIOTIC CONSULT NOTE - Follow-up  Pharmacy Consult for Zosyn Indication: gram negative bacteremia with liver abscess  Patient Measurements: Height: 5\' 11"  (180.3 cm) Weight: 169 lb 8.5 oz (76.9 kg) IBW/kg (Calculated) : 75.3  Vital Signs: Temp: 98.8 F (37.1 C) (12/07 0500) Temp Source: Oral (12/07 0500) BP: 127/69 mmHg (12/07 0500) Pulse Rate: 91 (12/07 0500)    Labs:  Recent Labs  07/30/15 0531  07/31/15 1018 07/31/15 1830 08/01/15 0425  WBC 16.6*  < > 17.4* 16.9* 15.8*  HGB 6.8*  < > 9.2* 9.8* 9.3*  PLT 384  < > 348 370 344  CREATININE 1.25*  --  1.22  --  1.32*  < > = values in this interval not displayed.   Microbiology: Cx data:  11/23 blood: B-lactamase negative, Prevotella species. 11/23 urine: multiple species 11/24 blood: ngF 11/26 liver abscess: ngF  Anti-infective's  vanc 11/23>>11/25 Zosyn 11/23>>11/24 - restarted 11/29 >> 21 day planned treatment rocephin 11/24>>11/25 Doxy 11/24>>11/24 Ceftaz 11/25 >> 11/29 Flagyl 11/25>>11/29  Assessment: 82 yoM with B-lactamase negative, Prevotella species bacteremia with concurrent liver abscess. Currently on Zosyn day 12 of 21 per ID. WBC elevated but improved from baseline, afebrile and has tolerated advancement of diet. Repeat CT on 12/5 showed minimal improvement in overall size of abscess but no plans at present to upsize drain or reposition it.  Goal of Therapy:  treatment of infection   Plan:  1. Continue Zosyn 3.375mg  Q8H for now; per ID needs 21 total days worth of IV antibiotics with today being day 12. Could go home on Ertapenem (Invanz) 1 gm daily for ease of administration as outpatient. 2. Following daily; notes prn  Vincenza Hews, PharmD, BCPS 08/01/2015, 8:15 AM Pager: [phone]

## 2015-08-01 NOTE — Progress Notes (Signed)
Brian [name] 3:47 PM  Subjective: Patient without any GI complaints although stools are still dark and a little blood in his bag and his wife called his primary doctor who believes his last colonoscopy was in 2011 here but unfortunately we cannot find a report in the computer and she will call back to them and see if they have that report and then possibly fax it to the floor  Objective: Vital signs stable afebrile no acute distress abdomen is soft nontender hemoglobin stable  Assessment: Subacute GI bleeding questionable etiology  Plan: We will continue to follow and hold GI workup for now but consider repeat workup at some point particularly if bleeding continues  [facility]  Pager [phone] After 5PM or if no answer call [phone]

## 2015-08-01 NOTE — Progress Notes (Signed)
Physical Therapy Treatment Patient Details Name: Brian [name] MRN: [id number] DOB: [birth year] Today's Date: 08/01/2015    History of Present Illness Admitted with weakness, fever tachycardia, sepsis likely secondary to lover abscess, drain placed by interventional Radiology on 11/26; since drain placement overall status improving; PMH significant for DM    PT Comments    Patient is progressing with ambulation and safety. Continue to walk with nursing staff to increase activity tolerance. Recommend HHPT for follow up and safety eval once home.   Follow Up Recommendations  Home health PT;Supervision/Assistance - 24 hour     Equipment Recommendations  Rolling walker with 5" wheels;3in1 (PT)    Recommendations for Other Services       Precautions / Restrictions Precautions Precautions: Fall    Mobility  Bed Mobility         Supine to sit: Supervision     General bed mobility comments: used rails and HOB elevated  Transfers Overall transfer level: Needs assistance Equipment used: Rolling walker (2 wheeled) Transfers: Sit to/from Stand Sit to Stand: Supervision         General transfer comment: v/c's for safe hand placement. Patient places one hand on RW and pushes off with one hand from the bed.   Ambulation/Gait Ambulation/Gait assistance: Min guard Ambulation Distance (Feet): 400 Feet Assistive device: Rolling walker (2 wheeled) Gait Pattern/deviations: Step-through pattern   Gait velocity interpretation: at or above normal speed for age/gender General Gait Details: Cues for posture. Patient with safe use of RW.    Stairs            Wheelchair Mobility    Modified Rankin (Stroke Patients Only)       Balance                                    Cognition Arousal/Alertness: Awake/alert Behavior During Therapy: WFL for tasks assessed/performed Overall Cognitive Status: Within Functional Limits for tasks assessed                       Exercises      General Comments        Pertinent Vitals/Pain Pain Assessment: No/denies pain    Home Living                      Prior Function            PT Goals (current goals can now be found in the care plan section) Progress towards PT goals: Progressing toward goals    Frequency  Min 3X/week    PT Plan Current plan remains appropriate    Co-evaluation             End of Session   Activity Tolerance: Patient tolerated treatment well Patient left: in bed;with call bell/phone within reach;with family/visitor present     Time: 1125-1140 PT Time Calculation (min) (ACUTE ONLY): 15 min  Charges:  $Gait Training: 8-22 mins                    G Codes:      Jacqualyn Posey 08/01/2015, 11:45 AM 08/01/2015 Camisha Srey, Tonia Brooms PTA

## 2015-08-01 NOTE — Progress Notes (Signed)
  Referring Physician(s): TRH  Chief Complaint:  Hepatic abscess  Subjective:  Hepatic abscess drain placed 11/26 CT 12/5 shows minimal improvement but, Dr Yamagata has reviewed and feels liquid portion is less. Rec: continue drain for now; flush with 10 cc sterile saline TID  Allergies: Niaspan; Shellfish allergy; Statins; and Zetia  Medications: Prior to Admission medications   Medication Sig Start Date End Date Taking? Authorizing Provider  amLODipine (NORVASC) 10 MG tablet Take 1 tablet (10 mg total) by mouth daily. 07/10/15  Yes Ashany Sundaram, MD  aspirin EC 81 MG tablet Take 81 mg by mouth daily.   Yes Historical Provider, MD  carvedilol (COREG) 6.25 MG tablet Take 1 tablet by mouth 2 (two) times daily. 02/27/15  Yes Historical Provider, MD  docusate sodium (COLACE) 100 MG capsule Take 1 capsule (100 mg total) by mouth 2 (two) times daily. 04/24/15  Yes Ashany Sundaram, MD  fenofibrate 160 MG tablet Take 1 tablet (160 mg total) by mouth daily. 06/05/15  Yes Ashany Sundaram, MD  finasteride (PROSCAR) 5 MG tablet Take 1 tablet (5 mg total) by mouth daily. 06/15/15  Yes Krichna Sowles, MD  glimepiride (AMARYL) 2 MG tablet Take 1 tablet (2 mg total) by mouth 2 (two) times daily. 06/05/15  Yes Ashany Sundaram, MD  Insulin Detemir (LEVEMIR) 100 UNIT/ML Pen Inject 8 Units into the skin daily. 05/08/15  Yes Ashany Sundaram, MD  Multiple Vitamin (MULTIVITAMIN WITH MINERALS) TABS tablet Take 1 tablet by mouth daily.   Yes Historical Provider, MD  quinapril (ACCUPRIL) 20 MG tablet Take 1 tablet (20 mg total) by mouth 2 (two) times daily. 02/14/15  Yes Ashany Sundaram, MD  sitaGLIPtin (JANUVIA) 25 MG tablet Take 1 tablet (25 mg total) by mouth daily. 05/03/15  Yes Ashany Sundaram, MD  Tamsulosin HCl (FLOMAX) 0.4 MG CAPS Take 0.4 mg by mouth daily.   Yes Historical Provider, MD  Blood Glucose Monitoring Suppl (ONE TOUCH ULTRA 2) W/DEVICE KIT  04/24/15   Historical Provider, MD  Cholecalciferol  (VITAMIN D3) 1000 UNITS CAPS Take 1 capsule by mouth daily.    Historical Provider, MD  diphenoxylate-atropine (LOMOTIL) 2.5-0.025 MG per tablet Take 1 tablet by mouth 4 (four) times daily as needed for diarrhea or loose stools. 04/24/15   Ashany Sundaram, MD  glucose blood test strip Use as instructed 03/28/15   Ashany Sundaram, MD  meloxicam (MOBIC) 7.5 MG tablet Take 7.5 mg by mouth 2 (two) times daily.     Historical Provider, MD     Vital Signs: BP 127/69 mmHg  Pulse 91  Temp(Src) 98.8 F (37.1 C) (Oral)  Resp 18  Ht 5' 11" (1.803 m)  Wt 169 lb 8.5 oz (76.9 kg)  BMI 23.66 kg/m2  SpO2 97%  Physical Exam  Abdominal: Soft. Bowel sounds are normal.  Site of drain clean and dry NT  No bleeding Output milky brown pus 20 cc in JP    Imaging: Ct Abdomen Pelvis W Contrast  07/30/2015  CLINICAL DATA:  Liver abscess. EXAM: CT ABDOMEN AND PELVIS WITH CONTRAST TECHNIQUE: Multidetector CT imaging of the abdomen and pelvis was performed using the standard protocol following bolus administration of intravenous contrast. CONTRAST:  100mL OMNIPAQUE IOHEXOL 300 MG/ML  SOLN COMPARISON:  CT scan of July 24, 2015. FINDINGS: Mild multilevel degenerative disc disease is noted in the lumbar spine. Moderate right pleural effusion is noted with adjacent subsegmental atelectasis. This is increased compared to prior exam. Stable mild left pleural effusion with adjacent subsegmental   atelectasis is noted. No gallstones are noted. Abscess seen in the posterior segment of the right hepatic lobe currently measures 7.8 x 7.7 x 4.8 cm. It is not significantly changed in size or appearance compared to prior exam. Stable positioning of drainage catheter is noted. The spleen and pancreas appear normal. Stable adrenal enlargement is noted compared to prior exam. Stable bilateral renal cysts are noted stable small nonobstructive calculus seen in lower pole collecting system of left kidney. No hydronephrosis or renal  obstruction is noted. No renal or ureteral calculi are noted. Atherosclerosis of abdominal aorta is noted without aneurysm formation. The appendix appears normal. There is no evidence of bowel obstruction. No abnormal fluid collection is noted. Small fat containing periumbilical hernia is noted. Sigmoid diverticulosis is noted without inflammation. Urinary bladder appears normal. Mild amount of free fluid is noted posteriorly in the pelvis which is decreased compared to prior exam. Mild anasarca is noted. No significant adenopathy is noted. IMPRESSION: Moderate right pleural effusion is noted with adjacent subsegmental atelectasis which is slightly enlarged compared to prior exam. Stable mild left pleural effusion. No change seen in size or appearance of abscess seen in posterior segment of right hepatic lobe. Stable positioning of drainage catheter is noted. Atherosclerosis of abdominal aorta is noted without aneurysm formation. Sigmoid diverticulosis is noted without inflammation. Stable small fat containing periumbilical hernia is noted. Electronically Signed   By: James  Green Jr, M.D.   On: 07/30/2015 13:48    Labs:  CBC:  Recent Labs  07/30/15 1908 07/31/15 1018 07/31/15 1830 08/01/15 0425  WBC 15.6* 17.4* 16.9* 15.8*  HGB 6.8* 9.2* 9.8* 9.3*  HCT 20.7* 27.5* 29.6* 27.0*  PLT 394 348 370 344    COAGS:  Recent Labs  07/19/15 1625 07/21/15 0256  INR 1.51* 1.42  APTT 40* 41*    BMP:  Recent Labs  07/29/15 0629 07/30/15 0531 07/31/15 1018 08/01/15 0425  NA 151* 148* 144 142  K 3.8 3.2* 3.4* 3.4*  CL 117* 114* 110 107  CO2 25 26 25 26  GLUCOSE 110* 90 100* 90  BUN 26* 21* 14 13  CALCIUM 8.2* 7.8* 7.9* 8.0*  CREATININE 1.28* 1.25* 1.22 1.32*  GFRNONAA 52* 53* 55* 50*  GFRAA 60* >60 >60 58*    LIVER FUNCTION TESTS:  Recent Labs  07/22/15 0307 07/24/15 0522 07/30/15 0531 07/31/15 1018  BILITOT 0.6 0.9 0.7 1.1  AST 66* 27 32 29  ALT 90* 55 22 22  ALKPHOS 61 73  58 58  PROT 5.4* 5.8* 5.0* 5.3*  ALBUMIN 1.9* 2.0* 1.7* 1.8*    Assessment and Plan:  Hepatic abscess drain intact Wbc trending down Leave drain for now Will need Re CT and follow up in IR drain clinic after discharge  Signed: , A 08/01/2015, 8:45 AM   I spent a total of 15 Minutes at the the patient's bedside AND on the patient's hospital floor or unit, greater than 50% of which was counseling/coordinating care for abscess drain      

## 2015-08-01 NOTE — Progress Notes (Signed)
[facility] PROGRESS NOTE  HUXTON WAND [id number] DOB: [birth year] DOA: 07/18/2015 PCP: [name], MD  Interim summary Brian [name] is a 81 year old gentleman with a past medical history of diabetes mellitus, admitted to the medicine service on 07/18/2015 when he presented with complaints of generalized weakness, dizziness, chills.  He was found to be febrile having a temperature of 103 and started on broad-spectrum IV antimicrobial therapy with vancomycin and Zosyn.  CT scan of abdomen and pelvis performed on 07/20/2015 showed a hypoattenuating lesion within the liver. This was further worked up with an MRI of liver that showed findings compatible with abscess collection. Interventional radiology was consulted for drain placement that was performed on 07/21/2015. Blood cultures obtained on admission growing 1/4 with Prevotella, with repeat blood cultures show no growth to date. Cultures from abscess growing gram-positive cocci.  Awaiting organism identification and susceptibility testing. Hospital course complicated by ileus/PSBO, treated conservatively with NG decompression, bowel rest, supportive care. CCS consulting, ileus resolved now. -however without much output from liver abscess drain and persistent leukocytosis,  12/5: Repeat CT : liver abscess unchanged -12/5 with drop in hb to 6.8 and melena, SQ lovenox stopped, Iv PPI, 2 units PRBC given  -12/6: [facility] consulted, recommendations given.   Assessment/Plan: 1. Severe sepsis/Liver abscess -On 07/20/2015: CT scan of abdomen and pelvis performed revealing 7.1 x 5.0 x 5.7 cm hypoattenuating lesion within the liver. Radiology reporting that this could represent an abscess vs primary hepatic malignancy. This was further worked up with MRI of liver that demonstrated findings compatible with abscess collection.    -On 07/21/2015 blood cultures drawn from 07/18/2015 growing 1/4 with Prevotella. Ceftriaxone changed to Fortaz  1 g IV every 12 hours and Vancomycin discontinued. -11/26  Interventional radiology placed drain catheter of liver abscess . Gram stain showing gram positive cocci in pairs from abscess, but culture no growth 2 days -Dr. Baxter Flattery of infectious disease, consulting -WBC slightly improved, remains on IV Zosyn  >5-10years ago -reportedly normal -repeat Ct 12/5 without change in size of abscess, CCS has no further recs, d/w IR, felt that fluid in collection slightly better and may take a while to improve and recommended FU in [facility].   2.  Ileus -Overnight 11/29, patient developed multiple episodes of nausea and vomiting and noted to have distended abdomen,  abdominal x-ray that showed adynamic ileus. -s/p NG decompression, improving, NG out now -tolerating diet -appreciate CCS consult  3. Acute blood loss anemia -Melena x2 days and s/p 2 units PRBC last pm 12/5,  -continue Iv PPI q12, stopped SQ lovenox -[facility] consulted - Hb stable.     Acute on chronic renal failure -Likely secondary to ATN in setting of hypotension or perhaps sepsis/critical illness - improving.   5.  Elevated troponin. -Lab work showing upward trend in his troponin to 0.30, no chest pain. -related to demand ischemia in setting of critical illness, Troponins trending down.  6. Diabetes mellitus -continue lantus,SSI  7.  History of hypertension. -He presented with hypotension for which antihypertensive medications were discontinued  8. Chronic diastolic CHF with volume overload which is iatrogenic -pleural effusion -IV lasix, monitor I/O  9. Hypokalemia: replete as needed.    DVT prophylaxis. Lovenox  Code Status: full code Family Communication: daughter at bedside Disposition Plan: pending improvement in melena and abscesses   Antibiotics:  Vancomycin started on 07/18/2015 stopped on 07/20/2015  Zosyn discontinued on 07/19/2015  Ceftriaxone started on 07/19/2015 stopped on 07/20/2015  Doxycycline started on 07/19/2015   [name] started on 07/20/2015  HPI/Subjective: No black stools .   Objective: Filed Vitals:   08/01/15 0900 08/01/15 1824  BP: 132/72 136/78  Pulse: 88 84  Temp: 98.4 F (36.9 C) 98.7 F (37.1 C)  Resp: 18 18    Intake/Output Summary (Last 24 hours) at 08/01/15 1844 Last data filed at 08/01/15 1825  Gross per 24 hour  Intake    640 ml  Output 2175.5 ml  Net -1535.5 ml   Filed Weights   07/28/15 2217 07/30/15 2100 07/31/15 2100  Weight: 78.1 kg (172 lb 2.9 oz) 78.6 kg (173 lb 4.5 oz) 76.9 kg (169 lb 8.5 oz)    Exam:   General:  ill-appearing, AAOx3, no distress.   Cardiovascular: regular rate and rhythm normal S1-S2  Respiratory: decreased Bs at bases Abdomen: Abdomen is less distended, tympanic, non tender, diminished BS, NG out Musculoskeletal:1-2+ ankle edema  Data Reviewed: Basic Metabolic Panel:  Recent Labs Lab 07/28/15 0349 07/29/15 0629 07/30/15 0531 07/31/15 1018 08/01/15 0425  NA 151* 151* 148* 144 142  K 3.6 3.8 3.2* 3.4* 3.4*  CL 118* 117* 114* 110 107  CO2 24 25 26 25 26   GLUCOSE 101* 110* 90 100* 90  BUN 26* 26* 21* 14 13  CREATININE 1.21 1.28* 1.25* 1.22 1.32*  CALCIUM 8.0* 8.2* 7.8* 7.9* 8.0*   Liver Function Tests:  Recent Labs Lab 07/30/15 0531 07/31/15 1018  AST 32 29  ALT 22 22  ALKPHOS 58 58  BILITOT 0.7 1.1  PROT 5.0* 5.3*  ALBUMIN 1.7* 1.8*   No results for input(s): LIPASE, AMYLASE in the last 168 hours. No results for input(s): AMMONIA in the last 168 hours. CBC:  Recent Labs Lab 07/30/15 0531 07/30/15 1908 07/31/15 1018 07/31/15 1830 08/01/15 0425  WBC 16.6* 15.6* 17.4* 16.9* 15.8*  HGB 6.8* 6.8* 9.2* 9.8* 9.3*  HCT 19.9* 20.7* 27.5* 29.6* 27.0*  MCV 85.4 85.9 84.9 84.8 84.9  PLT 384 394 348 370 344   Cardiac Enzymes: No results for input(s): CKTOTAL, CKMB, CKMBINDEX, TROPONINI in the last 168 hours. BNP (last 3 results) No results for input(s): BNP in the last  8760 hours.  ProBNP (last 3 results) No results for input(s): PROBNP in the last 8760 hours.  CBG:  Recent Labs Lab 07/31/15 1656 07/31/15 2134 08/01/15 0802 08/01/15 1203 08/01/15 1601  GLUCAP 95 108* 100* 136* 103*    No results found for this or any previous visit (from the past 240 hour(s)).   Studies: No results found.  Scheduled Meds: . sodium chloride   Intravenous Once  . carvedilol  6.25 mg Oral BID WC  . finasteride  5 mg Oral Daily  . furosemide  20 mg Intravenous Once  . [START ON 08/02/2015] furosemide  20 mg Intravenous Daily  . insulin aspart  0-15 Units Subcutaneous TID WC  . insulin aspart  0-5 Units Subcutaneous QHS  . insulin detemir  6 Units Subcutaneous Daily  . pantoprazole  40 mg Oral BID  . piperacillin-tazobactam (ZOSYN)  IV  3.375 g Intravenous Q8H  . sodium chloride  3 mL Intravenous Q12H  . tamsulosin  0.4 mg Oral Daily   Continuous Infusions:    Principal Problem:   Sepsis ([place]) Active Problems:   Hypertension goal BP (blood pressure) < 140/90   Hyperlipidemia LDL goal <100   Chronic kidney disease (CKD), stage III (moderate)   Anemia of chronic renal failure   Acute renal failure (  ARF) ([place])   Transaminitis   Diabetes mellitus, insulin dependent (IDDM), uncontrolled (HCC)   Leukocytosis   Acute kidney injury ([place])   SOB (shortness of breath)   Liver abscess   Fever and chills   Gram-negative bacteremia (HCC)   Abdominal distention   Ileus (HCC)   Abscess, liver    Time spent: 35 min    [facility] Pager [phone] If 7PM-7AM, please contact night-coverage at [email or web address], password [facility] 08/01/2015, 6:44 PM  LOS: 14 days

## 2015-08-02 ENCOUNTER — Inpatient Hospital Stay (HOSPITAL_COMMUNITY): Payer: Medicare Other

## 2015-08-02 DIAGNOSIS — E1022 Type 1 diabetes mellitus with diabetic chronic kidney disease: Secondary | ICD-10-CM

## 2015-08-02 DIAGNOSIS — E1065 Type 1 diabetes mellitus with hyperglycemia: Secondary | ICD-10-CM

## 2015-08-02 LAB — GLUCOSE, CAPILLARY
Glucose-Capillary: 107 mg/dL — ABNORMAL HIGH (ref 65–99)
Glucose-Capillary: 168 mg/dL — ABNORMAL HIGH (ref 65–99)
Glucose-Capillary: 123 mg/dL — ABNORMAL HIGH (ref 65–99)
Glucose-Capillary: 118 mg/dL — ABNORMAL HIGH (ref 65–99)

## 2015-08-02 LAB — BASIC METABOLIC PANEL
ANION GAP: 9 (ref 5–15)
BUN: 13 mg/dL (ref 6–20)
CO2: 27 mmol/L (ref 22–32)
Calcium: 8.1 mg/dL — ABNORMAL LOW (ref 8.9–10.3)
Chloride: 104 mmol/L (ref 101–111)
Creatinine, Ser: 1.33 mg/dL — ABNORMAL HIGH (ref 0.61–1.24)
GFR calc Af Amer: 57 mL/min — ABNORMAL LOW (ref >60)
GFR, EST NON AFRICAN AMERICAN: 49 mL/min — AB (ref >60)
Glucose, Bld: 168 mg/dL — ABNORMAL HIGH (ref 65–99)
POTASSIUM: 3.8 mmol/L (ref 3.5–5.1)
SODIUM: 140 mmol/L (ref 135–145)

## 2015-08-02 LAB — CBC
HCT: 27.9 % — ABNORMAL LOW (ref 39.0–52.0)
Hemoglobin: 9.2 g/dL — ABNORMAL LOW (ref 13.0–17.0)
MCH: 28.6 pg (ref 26.0–34.0)
MCHC: 33 g/dL (ref 30.0–36.0)
MCV: 86.6 fL (ref 78.0–100.0)
PLATELETS: 340 10*3/uL (ref 150–400)
RBC: 3.22 MIL/uL — AB (ref 4.22–5.81)
RDW: 16.4 % — ABNORMAL HIGH (ref 11.5–15.5)
WBC: 14.1 10*3/uL — AB (ref 4.0–10.5)

## 2015-08-02 MED ORDER — SODIUM CHLORIDE 0.9 % IJ SOLN: 10.0000 mL | INTRAMUSCULAR | Status: DC | PRN

## 2015-08-02 NOTE — Progress Notes (Signed)
Brian [name] 11:15 AM  Subjective: Patient without any further signs of bleeding and has not had a bowel movement in 24 hours wife still trying to track down previous colonoscopy  Objective: Vital signs stable afebrile no acute distress abdomen is soft nontender  Assessment: Subacute GI bleeding my best guess is hemobilia from his liver abscess  Plan: Will check on tomorrow but no other recommendations currently  [facility] E  Pager [phone] After 5PM or if no answer call [phone]

## 2015-08-02 NOTE — Progress Notes (Signed)
[facility] PROGRESS NOTE  Brian [name] [id number] DOB: 1933/10/28 DOA: 07/18/2015 PCP: [name], MD  Interim summary Brian [name] is a 81 year old gentleman with a past medical history of diabetes mellitus, admitted to the medicine service on 07/18/2015 when he presented with complaints of generalized weakness, dizziness, chills.  He was found to be febrile having a temperature of 103 and started on broad-spectrum IV antimicrobial therapy with vancomycin and Zosyn.  CT scan of abdomen and pelvis performed on 07/20/2015 showed a hypoattenuating lesion within the liver. This was further worked up with an MRI of liver that showed findings compatible with abscess collection. Interventional radiology was consulted for drain placement that was performed on 07/21/2015. Blood cultures obtained on admission growing 1/4 with Prevotella, with repeat blood cultures show no growth to date. Cultures from abscess growing gram-positive cocci.  Awaiting organism identification and susceptibility testing. Hospital course complicated by ileus/PSBO, treated conservatively with NG decompression, bowel rest, supportive care. CCS consulting, ileus resolved now. -however without much output from liver abscess drain and persistent leukocytosis,  12/5: Repeat CT : liver abscess unchanged -12/5 with drop in hb to 6.8 and melena, SQ lovenox stopped, Iv PPI, 2 units PRBC given  -12/6: [facility] consulted, recommendations given.  12/8 : cxr shows worsening of the right pleural effusion  Assessment/Plan: 1. Severe sepsis/Liver abscess -On 07/20/2015: CT scan of abdomen and pelvis performed revealing 7.1 x 5.0 x 5.7 cm hypoattenuating lesion within the liver. Radiology reporting that this could represent an abscess vs primary hepatic malignancy. This was further worked up with MRI of liver that demonstrated findings compatible with abscess collection.    -On 07/21/2015 blood cultures drawn from 07/18/2015  growing 1/4 with Prevotella. Ceftriaxone changed to Fortaz 1 g IV every 12 hours and Vancomycin discontinued. -11/26  Interventional radiology placed drain catheter of liver abscess . Gram stain showing gram positive cocci in pairs from abscess, but culture no growth 2 days -Dr. Baxter Flattery of infectious disease, consulting -WBC slightly improved, remains on IV Zosyn  >5-10years ago -reportedly normal -repeat Ct 12/5 without change in size of abscess, CCS has no further recs, d/w IR, felt that fluid in collection slightly better and may take a while to improve and recommended FU in [facility].   2.  Ileus -Overnight 11/29, patient developed multiple episodes of nausea and vomiting and noted to have distended abdomen,  abdominal x-ray that showed adynamic ileus. -s/p NG decompression, improving, NG out now -tolerating diet -appreciate CCS consult  3. Acute blood loss anemia -Melena x2 days and s/p 2 units PRBC last pm 12/5,  -continue Iv PPI q12, stopped SQ lovenox -[facility] consulted - Hb stable.     Acute on chronic renal failure -Likely secondary to ATN in setting of hypotension or perhaps sepsis/critical illness - improving.   5.  Elevated troponin. -Lab work showing upward trend in his troponin to 0.30, no chest pain. -related to demand ischemia in setting of critical illness, Troponins trending down.  6. Diabetes mellitus -continue lantus,SSI CBG (last 3)   Recent Labs  08/01/15 2133 08/02/15 0756 08/02/15 1217  GLUCAP 109* 107* 168*    Resume SSI.   7.  History of hypertension. -He presented with hypotension for which antihypertensive medications were discontinued  8. Chronic diastolic CHF with volume overload which is iatrogenic -pleural effusion -IV lasix, monitor I/O  9. Hypokalemia: replete as needed.   10. Worsening right pleural effusion; thoracentesis ordered for 12/9 am.  Further analysis of  the pleural fluid will be ordered.    DVT prophylaxis.  Lovenox  Code Status: full code Family Communication: FAMILY at bedside Disposition Plan: possible discharged home with home PT in 1 to 2 days after thoracentesis.    Antibiotics:  Vancomycin started on 07/18/2015 stopped on 07/20/2015  Zosyn discontinued on 07/19/2015  Ceftriaxone started on 07/19/2015 stopped on 07/20/2015   Doxycycline started on 07/19/2015   [name] started on 07/20/2015  HPI/Subjective: One episode of malena today. Feels very weak.   Objective: Filed Vitals:   08/02/15 0945 08/02/15 1755  BP: 138/78 134/72  Pulse: 87 93  Temp: 98.7 F (37.1 C) 97.9 F (36.6 C)  Resp: 18 18    Intake/Output Summary (Last 24 hours) at 08/02/15 1805 Last data filed at 08/02/15 1543  Gross per 24 hour  Intake    890 ml  Output 1338.5 ml  Net -448.5 ml   Filed Weights   07/30/15 2100 07/31/15 2100 08/01/15 2100  Weight: 78.6 kg (173 lb 4.5 oz) 76.9 kg (169 lb 8.5 oz) 76.1 kg (167 lb 12.3 oz)    Exam:   General:  ill-appearing, AAOx3, no distress.   Cardiovascular: regular rate and rhythm normal S1-S2  Respiratory: decreased Bs at bases Abdomen: Abdomen is less distended, tympanic, non tender, diminished BS, NG out Musculoskeletal:1-2+ ankle edema  Data Reviewed: Basic Metabolic Panel:  Recent Labs Lab 07/29/15 0629 07/30/15 0531 07/31/15 1018 08/01/15 0425 08/02/15 1245  NA 151* 148* 144 142 140  K 3.8 3.2* 3.4* 3.4* 3.8  CL 117* 114* 110 107 104  CO2 25 26 25 26 27   GLUCOSE 110* 90 100* 90 168*  BUN 26* 21* 14 13 13   CREATININE 1.28* 1.25* 1.22 1.32* 1.33*  CALCIUM 8.2* 7.8* 7.9* 8.0* 8.1*   Liver Function Tests:  Recent Labs Lab 07/30/15 0531 07/31/15 1018  AST 32 29  ALT 22 22  ALKPHOS 58 58  BILITOT 0.7 1.1  PROT 5.0* 5.3*  ALBUMIN 1.7* 1.8*   No results for input(s): LIPASE, AMYLASE in the last 168 hours. No results for input(s): AMMONIA in the last 168 hours. CBC:  Recent Labs Lab 07/30/15 1908 07/31/15 1018  07/31/15 1830 08/01/15 0425 08/02/15 1245  WBC 15.6* 17.4* 16.9* 15.8* 14.1*  HGB 6.8* 9.2* 9.8* 9.3* 9.2*  HCT 20.7* 27.5* 29.6* 27.0* 27.9*  MCV 85.9 84.9 84.8 84.9 86.6  PLT 394 348 370 344 340   Cardiac Enzymes: No results for input(s): CKTOTAL, CKMB, CKMBINDEX, TROPONINI in the last 168 hours. BNP (last 3 results) No results for input(s): BNP in the last 8760 hours.  ProBNP (last 3 results) No results for input(s): PROBNP in the last 8760 hours.  CBG:  Recent Labs Lab 08/01/15 1203 08/01/15 1601 08/01/15 2133 08/02/15 0756 08/02/15 1217  GLUCAP 136* 103* 109* 107* 168*    No results found for this or any previous visit (from the past 240 hour(s)).   Studies: Dg Chest 2 View  08/02/2015  CLINICAL DATA:  Sob x 1 day, elevated troponin, ex smoker EXAM: CHEST  2 VIEW COMPARISON:  07/04/2015 FINDINGS: Right-sided PICC line tip overlies the level of the lower superior vena cava. The heart is enlarged. There are bilateral pleural effusions. Right basilar opacity is consistent with atelectasis or infiltrate. A pigtail type catheter is identified within the right upper quadrant of the abdomen. IMPRESSION: 1. Increased bibasilar atelectasis or infiltrates and effusions, right greater than left. 2. Interval placement of PICC line. Electronically Signed  By: Nolon Nations M.D.   On: 08/02/2015 15:17    Scheduled Meds: . sodium chloride   Intravenous Once  . carvedilol  6.25 mg Oral BID WC  . finasteride  5 mg Oral Daily  . furosemide  20 mg Intravenous Once  . insulin aspart  0-15 Units Subcutaneous TID WC  . insulin aspart  0-5 Units Subcutaneous QHS  . insulin detemir  6 Units Subcutaneous Daily  . pantoprazole  40 mg Oral BID  . piperacillin-tazobactam (ZOSYN)  IV  3.375 g Intravenous Q8H  . sodium chloride  3 mL Intravenous Q12H  . tamsulosin  0.4 mg Oral Daily   Continuous Infusions:    Principal Problem:   Sepsis ([place]) Active Problems:   Hypertension goal BP  (blood pressure) < 140/90   Hyperlipidemia LDL goal <100   Chronic kidney disease (CKD), stage III (moderate)   Anemia of chronic renal failure   Acute renal failure (ARF) (HCC)   Transaminitis   Diabetes mellitus, insulin dependent (IDDM), uncontrolled (HCC)   Leukocytosis   Acute kidney injury ([place])   SOB (shortness of breath)   Liver abscess   Fever and chills   Gram-negative bacteremia (HCC)   Abdominal distention   Ileus (HCC)   Abscess, liver    Time spent: 35 min    Jarrah Seher  [facility] Pager [phone] If 7PM-7AM, please contact night-coverage at [email or web address], password [facility] 08/02/2015, 6:05 PM  LOS: 15 days

## 2015-08-02 NOTE — Care Management Important Message (Signed)
Important Message  Patient Details  Name: Brian [name] MRN: [id number] Date of Birth: Nov 05, 1933   Medicare Important Message Given:  Yes    Ilisha Blust P [place] 08/02/2015, 11:42 AM

## 2015-08-02 NOTE — Progress Notes (Signed)
Peripherally Inserted Central Catheter/Midline Placement  The IV Nurse has discussed with the patient and/or persons authorized to consent for the patient, the purpose of this procedure and the potential benefits and risks involved with this procedure.  The benefits include less needle sticks, lab draws from the catheter and patient may be discharged home with the catheter.  Risks include, but not limited to, infection, bleeding, blood clot (thrombus formation), and puncture of an artery; nerve damage and irregular heat beat.  Alternatives to this procedure were also discussed.  PICC/Midline Placement Documentation        Brian [name] 08/02/2015, 11:53 AM Consent obtained by Christella Noa, RN

## 2015-08-03 ENCOUNTER — Inpatient Hospital Stay (HOSPITAL_COMMUNITY): Payer: Medicare Other

## 2015-08-03 LAB — GLUCOSE, CAPILLARY
GLUCOSE-CAPILLARY: 123 mg/dL — AB (ref 65–99)
GLUCOSE-CAPILLARY: 140 mg/dL — AB (ref 65–99)
GLUCOSE-CAPILLARY: 84 mg/dL (ref 65–99)
Glucose-Capillary: 70 mg/dL (ref 65–99)

## 2015-08-03 LAB — BODY FLUID CELL COUNT WITH DIFFERENTIAL
Eos, Fluid: 1 %
LYMPHS FL: 22 %
MONOCYTE-MACROPHAGE-SEROUS FLUID: 10 % — AB (ref 50–90)
Neutrophil Count, Fluid: 67 % — ABNORMAL HIGH (ref 0–25)
Total Nucleated Cell Count, Fluid: 2677 cu mm — ABNORMAL HIGH (ref 0–1000)

## 2015-08-03 LAB — LACTATE DEHYDROGENASE, PLEURAL OR PERITONEAL FLUID: LD, Fluid: 303 U/L — ABNORMAL HIGH (ref 3–23)

## 2015-08-03 LAB — PROTEIN, BODY FLUID: TOTAL PROTEIN, FLUID: 3.4 g/dL

## 2015-08-03 LAB — GRAM STAIN

## 2015-08-03 MED ORDER — GUAIFENESIN-DM 100-10 MG/5ML PO SYRP: 5.0000 mL | ORAL_SOLUTION | ORAL | Status: DC | PRN

## 2015-08-03 MED ORDER — LIDOCAINE HCL (PF) 1 % IJ SOLN
INTRAMUSCULAR | Status: AC
  Filled 2015-08-03: qty 10

## 2015-08-03 MED ORDER — SODIUM CHLORIDE 0.9 % IV SOLN
1.0000 g | INTRAVENOUS | Status: DC
  Administered 2015-08-03 – 2015-08-04 (×2): 1 g via INTRAVENOUS
  Filled 2015-08-03 (×3): qty 1

## 2015-08-03 NOTE — Progress Notes (Addendum)
Brian [name] 10:58 AM  Subjective: Patient without any specific complaints and going for a thoracentesis today but no signs of bleeding and unfortunately colonoscopy report not faxed yet Objective: Vital signs stable afebrile abdomen is soft nontender hemoglobin stable  Assessment: Guaiac positive anemia possibly due to hemobilia  Plan: Please call us if we can be of any further assistance with this hospital stay and might need a GI workup at some point and that was discussed with both the patient and his wife and they agree [facility]  Pager [phone] After 5PM or if no answer call [phone]

## 2015-08-03 NOTE — Progress Notes (Signed)
Referring Physician(s): Dr. Coralyn Pear  Chief Complaint:  Hepatic abscess S/P Perc drain by Dr. Laurence Ferrari on 07/21/2015  Subjective:  Brian [name] is doing ok.   He has no complaints.  Allergies: Niaspan; Shellfish allergy; Statins; and Zetia  Medications: Prior to Admission medications   Medication Sig Start Date End Date Taking? Authorizing Provider  amLODipine (NORVASC) 10 MG tablet Take 1 tablet (10 mg total) by mouth daily. 07/10/15  Yes [name], MD  aspirin EC 81 MG tablet Take 81 mg by mouth daily.   Yes Historical Provider, MD  carvedilol (COREG) 6.25 MG tablet Take 1 tablet by mouth 2 (two) times daily. 02/27/15  Yes Historical Provider, MD  docusate sodium (COLACE) 100 MG capsule Take 1 capsule (100 mg total) by mouth 2 (two) times daily. 04/24/15  Yes [name], MD  fenofibrate 160 MG tablet Take 1 tablet (160 mg total) by mouth daily. 06/05/15  Yes [name], MD  finasteride (PROSCAR) 5 MG tablet Take 1 tablet (5 mg total) by mouth daily. 06/15/15  Yes [name], MD  glimepiride (AMARYL) 2 MG tablet Take 1 tablet (2 mg total) by mouth 2 (two) times daily. 06/05/15  Yes [name], MD  Insulin Detemir (LEVEMIR) 100 UNIT/ML Pen Inject 8 Units into the skin daily. 05/08/15  Yes [name], MD  Multiple Vitamin (MULTIVITAMIN WITH MINERALS) TABS tablet Take 1 tablet by mouth daily.   Yes Historical Provider, MD  quinapril (ACCUPRIL) 20 MG tablet Take 1 tablet (20 mg total) by mouth 2 (two) times daily. 02/14/15  Yes [name], MD  sitaGLIPtin (JANUVIA) 25 MG tablet Take 1 tablet (25 mg total) by mouth daily. 05/03/15  Yes [name], MD  Tamsulosin HCl (FLOMAX) 0.4 MG CAPS Take 0.4 mg by mouth daily.   Yes Historical Provider, MD  Blood Glucose Monitoring Suppl (ONE TOUCH ULTRA 2) W/DEVICE KIT  04/24/15   Historical Provider, MD  Cholecalciferol (VITAMIN D3) 1000 UNITS CAPS Take 1 capsule by mouth daily.    Historical Provider, MD    diphenoxylate-atropine (LOMOTIL) 2.5-0.025 MG per tablet Take 1 tablet by mouth 4 (four) times daily as needed for diarrhea or loose stools. 04/24/15   [name], MD  glucose blood test strip Use as instructed 03/28/15   [name], MD  meloxicam (MOBIC) 7.5 MG tablet Take 7.5 mg by mouth 2 (two) times daily.     Historical Provider, MD     Vital Signs: BP 133/74 mmHg  Pulse 99  Temp(Src) 98.4 F (36.9 C) (Oral)  Resp 17  Ht 5' 11" (1.803 m)  Wt 167 lb 8.8 oz (76 kg)  BMI 23.38 kg/m2  SpO2 99%  Physical Exam Awake and Alert Hepatic abscess drain in place Intact. Site looks good Flushes easily ~20 mls purulent output recorded and about 20 more in the bulb.  Imaging: Dg Chest 2 View  08/02/2015  CLINICAL DATA:  Sob x 1 day, elevated troponin, ex smoker EXAM: CHEST  2 VIEW COMPARISON:  07/04/2015 FINDINGS: Right-sided PICC line tip overlies the level of the lower superior vena cava. The heart is enlarged. There are bilateral pleural effusions. Right basilar opacity is consistent with atelectasis or infiltrate. A pigtail type catheter is identified within the right upper quadrant of the abdomen. IMPRESSION: 1. Increased bibasilar atelectasis or infiltrates and effusions, right greater than left. 2. Interval placement of PICC line. Electronically Signed   By: Nolon Nations M.D.   On: 08/02/2015 15:17   Ct Abdomen  Pelvis W Contrast  07/30/2015  CLINICAL DATA:  Liver abscess. EXAM: CT ABDOMEN AND PELVIS WITH CONTRAST TECHNIQUE: Multidetector CT imaging of the abdomen and pelvis was performed using the standard protocol following bolus administration of intravenous contrast. CONTRAST:  133m OMNIPAQUE IOHEXOL 300 MG/ML  SOLN COMPARISON:  CT scan of July 24, 2015. FINDINGS: Mild multilevel degenerative disc disease is noted in the lumbar spine. Moderate right pleural effusion is noted with adjacent subsegmental atelectasis. This is increased compared to prior exam. Stable mild  left pleural effusion with adjacent subsegmental atelectasis is noted. No gallstones are noted. Abscess seen in the posterior segment of the right hepatic lobe currently measures 7.8 x 7.7 x 4.8 cm. It is not significantly changed in size or appearance compared to prior exam. Stable positioning of drainage catheter is noted. The spleen and pancreas appear normal. Stable adrenal enlargement is noted compared to prior exam. Stable bilateral renal cysts are noted stable small nonobstructive calculus seen in lower pole collecting system of left kidney. No hydronephrosis or renal obstruction is noted. No renal or ureteral calculi are noted. Atherosclerosis of abdominal aorta is noted without aneurysm formation. The appendix appears normal. There is no evidence of bowel obstruction. No abnormal fluid collection is noted. Small fat containing periumbilical hernia is noted. Sigmoid diverticulosis is noted without inflammation. Urinary bladder appears normal. Mild amount of free fluid is noted posteriorly in the pelvis which is decreased compared to prior exam. Mild anasarca is noted. No significant adenopathy is noted. IMPRESSION: Moderate right pleural effusion is noted with adjacent subsegmental atelectasis which is slightly enlarged compared to prior exam. Stable mild left pleural effusion. No change seen in size or appearance of abscess seen in posterior segment of right hepatic lobe. Stable positioning of drainage catheter is noted. Atherosclerosis of abdominal aorta is noted without aneurysm formation. Sigmoid diverticulosis is noted without inflammation. Stable small fat containing periumbilical hernia is noted. Electronically Signed   By: JMarijo Conception M.D.   On: 07/30/2015 13:48   UKoreaThoracentesis Asp Pleural Space W/img Guide  08/03/2015  WArdis Rowan PA-C     08/03/2015 11:06 AM Successful UKoreaguided right thoracentesis. Yielded 900 mls of blood tinged fluid. Pt tolerated procedure well. No immediate  complications. Specimen was sent for labs. CXR ordered. Brian Givan S Tiffinie Caillier PA-C 08/03/2015 11:06 AM    Labs:  CBC:  Recent Labs  07/31/15 1018 07/31/15 1830 08/01/15 0425 08/02/15 1245  WBC 17.4* 16.9* 15.8* 14.1*  HGB 9.2* 9.8* 9.3* 9.2*  HCT 27.5* 29.6* 27.0* 27.9*  PLT 348 370 344 340    COAGS:  Recent Labs  07/19/15 1625 07/21/15 0256  INR 1.51* 1.42  APTT 40* 41*    BMP:  Recent Labs  07/30/15 0531 07/31/15 1018 08/01/15 0425 08/02/15 1245  NA 148* 144 142 140  K 3.2* 3.4* 3.4* 3.8  CL 114* 110 107 104  CO2 _0 GLUCOSE 90 100* 90 168*  BUN 21* _1 CALCIUM 7.8* 7.9* 8.0* 8.1*  CREATININE 1.25* 1.22 1.32* 1.33*  GFRNONAA 53* 55* 50* 49*  GFRAA >60 >60 58* 57*    LIVER FUNCTION TESTS:  Recent Labs  07/22/15 0307 07/24/15 0522 07/30/15 0531 07/31/15 1018  BILITOT 0.6 0.9 0.7 1.1  AST 66* 27 32 29  ALT 90* 55 22 22  ALKPHOS 61 73 58 58  PROT 5.4* 5.8* 5.0* 5.3*  ALBUMIN 1.9* 2.0* 1.7* 1.8*    Assessment and Plan:  Hepatic abscess drain intact WBC continue to trend down Leave drain for now Will need Re CT and follow up in IR drain clinic after discharge  Pleural effusion = s/p thoracentesis today. Did well.  Signed: Murrell Redden PA-C 08/03/2015, 11:13 AM   I spent a total of 15 Minutes at the the patient's bedside AND on the patient's hospital floor or unit, greater than 50% of which was counseling/coordinating care for hepatic abscess drain.

## 2015-08-03 NOTE — Progress Notes (Signed)
Physical Therapy Treatment Patient Details Name: Brian [name] MRN: [id number] DOB: 02-Jul-1934 Today's Date: 08/03/2015    History of Present Illness Admitted with weakness, fever tachycardia, sepsis likely secondary to lover abscess, drain placed by interventional Radiology on 11/26; since drain placement overall status improving; PMH significant for DM    PT Comments    Good progress towards functional goals. HR 122 while ambulating with SpO2 97% on room air. Safely practiced stair navigation with min guard assist. Pt will benefit from HHPT at discharge to continue progression of strength, balance, conditioning, and safety with mobility.  Follow Up Recommendations  Home health PT;Supervision/Assistance - 24 hour     Equipment Recommendations  Rolling walker with 5" wheels;3in1 (PT)    Recommendations for Other Services       Precautions / Restrictions Precautions Precautions: Fall Restrictions Weight Bearing Restrictions: No    Mobility  Bed Mobility Overal bed mobility: Needs Assistance Bed Mobility: Supine to Sit     Supine to sit: Supervision;HOB elevated     General bed mobility comments: Very slow to able to perform on his own with use of rail and HOB elevated  Transfers Overall transfer level: Needs assistance Equipment used: Rolling walker (2 wheeled) Transfers: Sit to/from Stand Sit to Stand: Supervision         General transfer comment: Supervision for safety. slow to rise. VC for hand placement. Performed from bed and BSC.  Ambulation/Gait Ambulation/Gait assistance: Supervision Ambulation Distance (Feet): 350 Feet Assistive device: Rolling walker (2 wheeled) Gait Pattern/deviations: Step-through pattern;Decreased stride length;Scissoring;Trunk flexed;Narrow base of support   Gait velocity interpretation: at or above normal speed for age/gender General Gait Details: Cues for upright posture and forward gaze intermittently. No overt loss of  balance. Good RW control. Cues to widen base of support and for awareness of scissoring.   Stairs Stairs: Yes Stairs assistance: Min guard Stair Management: One rail Left;Step to pattern;Sideways;Forwards Number of Stairs: 7 General stair comments: Practiced forward with single rail use and sideways with both hands on rail. VC for sequencing and technique. No overt loss of balance but difficult for patient requiring close guard for safety.  Wheelchair Mobility    Modified Rankin (Stroke Patients Only)       Balance                                    Cognition Arousal/Alertness: Awake/alert Behavior During Therapy: WFL for tasks assessed/performed Overall Cognitive Status: Within Functional Limits for tasks assessed                      Exercises      General Comments General comments (skin integrity, edema, etc.): HR to 122 while ambulating, returns to 90s after sitting. SpO2 97% on room air.       Pertinent Vitals/Pain Pain Assessment: No/denies pain Pain Intervention(s): Monitored during session    Home Living                      Prior Function            PT Goals (current goals can now be found in the care plan section) Acute Rehab PT Goals PT Goal Formulation: With patient Time For Goal Achievement: 08/05/15 Potential to Achieve Goals: Good Progress towards PT goals: Progressing toward goals    Frequency  Min 3X/week    PT Plan Current plan  remains appropriate    Co-evaluation             End of Session   Activity Tolerance: Patient tolerated treatment well Patient left: in chair;with call bell/phone within reach;with family/visitor present     Time: 1225-1249 PT Time Calculation (min) (ACUTE ONLY): 24 min  Charges:  $Gait Training: 8-22 mins $Therapeutic Activity: 8-22 mins                    G Codes:      Ellouise Newer August 31, 2015, 1:01 PM Camille Bal [place], [place]

## 2015-08-03 NOTE — Procedures (Signed)
Successful US guided right thoracentesis. Yielded 900 mls of blood tinged fluid. Pt tolerated procedure well. No immediate complications.  Specimen was sent for labs. CXR ordered.  Mickel Schreur S Margel Joens PA-C 08/03/2015 11:06 AM

## 2015-08-03 NOTE — Progress Notes (Signed)
[facility] PROGRESS NOTE  Brian [name] [id number] DOB: 10-26-1933 DOA: 07/18/2015 PCP: [name], MD  Interim summary [name] is a 81 year old gentleman with a past medical history of diabetes mellitus, admitted to the medicine service on 07/18/2015 when he presented with complaints of generalized weakness, dizziness, chills.  He was found to be febrile having a temperature of 103 and started on broad-spectrum IV antimicrobial therapy with vancomycin and Zosyn.  CT scan of abdomen and pelvis performed on 07/20/2015 showed a hypoattenuating lesion within the liver. This was further worked up with an MRI of liver that showed findings compatible with abscess collection. Interventional radiology was consulted for drain placement that was performed on 07/21/2015. Blood cultures obtained on admission growing 1/4 with Prevotella, with repeat blood cultures show no growth to date. Cultures from abscess growing gram-positive cocci.  Awaiting organism identification and susceptibility testing. Hospital course complicated by ileus/PSBO, treated conservatively with NG decompression, bowel rest, supportive care. CCS consulting, ileus resolved now. -however without much output from liver abscess drain and persistent leukocytosis,  12/5: Repeat CT : liver abscess unchanged -12/5 with drop in hb to 6.8 and melena, SQ lovenox stopped, Iv PPI, 2 units PRBC given  -12/6: [facility] consulted, recommendations given.  12/8 : cxr shows worsening of the right pleural effusion 12/9 : right sided thoracentesis US guided.   Assessment/Plan: 1. Severe sepsis/Liver abscess -On 07/20/2015: CT scan of abdomen and pelvis performed revealing 7.1 x 5.0 x 5.7 cm hypoattenuating lesion within the liver. Radiology reporting that this could represent an abscess vs primary hepatic malignancy. This was further worked up with MRI of liver that demonstrated findings compatible with abscess collection.    -On  07/21/2015 blood cultures drawn from 07/18/2015 growing 1/4 with Prevotella. Ceftriaxone changed to Fortaz 1 g IV every 12 hours and Vancomycin discontinued. -11/26  Interventional radiology placed drain catheter of liver abscess . Gram stain showing gram positive cocci in pairs from abscess, but culture no growth 2 days -repeat Ct 12/5 without change in size of abscess, CCS has no further recs, d/w IR, felt that fluid in collection slightly better and may take a while to improve and recommended FU in [facility]. - improving WBC count, and changed zosyn to invanz, for possible d.c in am. Plan for 21 days of IV antibiotics total to complete the course.    2.  Ileus -Overnight 11/29, patient developed multiple episodes of nausea and vomiting and noted to have distended abdomen,  abdominal x-ray that showed adynamic ileus. -s/p NG decompression, improving, NG out now -tolerating diet -appreciate CCS consult  3. Acute blood loss anemia -Melena x2 days and s/p 2 units PRBC last pm 12/5,  -continue Iv PPI q12, stopped SQ lovenox -[facility] consulted - Hb stable.     Acute on chronic renal failure -Likely secondary to ATN in setting of hypotension or perhaps sepsis/critical illness - improving.   5.  Elevated troponin. -Lab work showing upward trend in his troponin to 0.30, no chest pain. -related to demand ischemia in setting of critical illness, Troponins trending down.  6. Diabetes mellitus -continue lantus,SSI CBG (last 3)   Recent Labs  08/02/15 2134 08/03/15 0827 08/03/15 1201  GLUCAP 118* 123* 140*    Resume SSI.   7.  History of hypertension. -He presented with hypotension for which antihypertensive medications were discontinued  8. Chronic diastolic CHF with volume overload which is iatrogenic -pleural effusion -IV lasix, monitor I/O  9. Hypokalemia: replete as  needed.   10. Worsening right pleural effusion; thoracentesis ordered for 12/9 am.  Further analysis of  the pleural fluid ordered.    DVT prophylaxis. Lovenox  Code Status: full code Family Communication: FAMILY at bedside Disposition Plan: possible discharged home with home PT in 1 to 2 days after thoracentesis.    Antibiotics:  Invanz.12/9  HPI/Subjective: One episode of malena today. Feels very weak.   Objective: Filed Vitals:   08/03/15 1044 08/03/15 1100  BP: 131/70 133/74  Pulse:    Temp:    Resp:      Intake/Output Summary (Last 24 hours) at 08/03/15 1335 Last data filed at 08/03/15 0841  Gross per 24 hour  Intake     15 ml  Output    570 ml  Net   -555 ml   Filed Weights   07/31/15 2100 08/01/15 2100 08/02/15 2136  Weight: 76.9 kg (169 lb 8.5 oz) 76.1 kg (167 lb 12.3 oz) 76 kg (167 lb 8.8 oz)    Exam:   General:  ill-appearing, AAOx3, no distress.   Cardiovascular: regular rate and rhythm normal S1-S2  Respiratory: decreased Bs at bases Abdomen: Abdomen is less distended, tympanic, non tender, diminished BS, NG out Musculoskeletal:1-2+ ankle edema  Data Reviewed: Basic Metabolic Panel:  Recent Labs Lab 07/29/15 0629 07/30/15 0531 07/31/15 1018 08/01/15 0425 08/02/15 1245  NA 151* 148* 144 142 140  K 3.8 3.2* 3.4* 3.4* 3.8  CL 117* 114* 110 107 104  CO2 25 26 25 26 27   GLUCOSE 110* 90 100* 90 168*  BUN 26* 21* 14 13 13   CREATININE 1.28* 1.25* 1.22 1.32* 1.33*  CALCIUM 8.2* 7.8* 7.9* 8.0* 8.1*   Liver Function Tests:  Recent Labs Lab 07/30/15 0531 07/31/15 1018  AST 32 29  ALT 22 22  ALKPHOS 58 58  BILITOT 0.7 1.1  PROT 5.0* 5.3*  ALBUMIN 1.7* 1.8*   No results for input(s): LIPASE, AMYLASE in the last 168 hours. No results for input(s): AMMONIA in the last 168 hours. CBC:  Recent Labs Lab 07/30/15 1908 07/31/15 1018 07/31/15 1830 08/01/15 0425 08/02/15 1245  WBC 15.6* 17.4* 16.9* 15.8* 14.1*  HGB 6.8* 9.2* 9.8* 9.3* 9.2*  HCT 20.7* 27.5* 29.6* 27.0* 27.9*  MCV 85.9 84.9 84.8 84.9 86.6  PLT 394 348 370 344 340    Cardiac Enzymes: No results for input(s): CKTOTAL, CKMB, CKMBINDEX, TROPONINI in the last 168 hours. BNP (last 3 results) No results for input(s): BNP in the last 8760 hours.  ProBNP (last 3 results) No results for input(s): PROBNP in the last 8760 hours.  CBG:  Recent Labs Lab 08/02/15 1217 08/02/15 1754 08/02/15 2134 08/03/15 0827 08/03/15 1201  GLUCAP 168* 123* 118* 123* 140*    Recent Results (from the past 240 hour(s))  Gram stain     Status: None   Collection Time: 08/03/15 10:51 AM  Result Value Ref Range Status   Specimen Description FLUID RIGHT PLEURAL  Final   Special Requests NONE  Final   Gram Stain   Final    MODERATE WBC PRESENT,BOTH PMN AND MONONUCLEAR NO ORGANISMS SEEN    Report Status 08/03/2015 FINAL  Final     Studies: Dg Chest 1 View  08/03/2015  CLINICAL DATA:  Postop thoracentesis on the RIGHT. EXAM: CHEST 1 VIEW COMPARISON:  None. FINDINGS: Cardiomegaly. Normal mediastinal contours. PICC line tip proximal RIGHT atrium. Improved RIGHT effusion status post thoracentesis. No pneumothorax. Hepatic drainage catheter unchanged. Mild BILATERAL subsegmental atelectasis and  slight effusion on the LEFT. IMPRESSION: Improved RIGHT effusion status post thoracentesis.  No pneumothorax. Electronically Signed   By: Staci Righter M.D.   On: 08/03/2015 11:54   Dg Chest 2 View  08/02/2015  CLINICAL DATA:  Sob x 1 day, elevated troponin, ex smoker EXAM: CHEST  2 VIEW COMPARISON:  07/04/2015 FINDINGS: Right-sided PICC line tip overlies the level of the lower superior vena cava. The heart is enlarged. There are bilateral pleural effusions. Right basilar opacity is consistent with atelectasis or infiltrate. A pigtail type catheter is identified within the right upper quadrant of the abdomen. IMPRESSION: 1. Increased bibasilar atelectasis or infiltrates and effusions, right greater than left. 2. Interval placement of PICC line. Electronically Signed   By: Nolon Nations  M.D.   On: 08/02/2015 15:17   US Thoracentesis Asp Pleural Space W/img Guide  08/03/2015  Ardis Rowan, PA-C     08/03/2015 11:06 AM Successful US guided right thoracentesis. Yielded 900 mls of blood tinged fluid. Pt tolerated procedure well. No immediate complications. Specimen was sent for labs. CXR ordered. WENDY S BLAIR PA-C 08/03/2015 11:06 AM    Scheduled Meds: . sodium chloride   Intravenous Once  . carvedilol  6.25 mg Oral BID WC  . finasteride  5 mg Oral Daily  . furosemide  20 mg Intravenous Once  . insulin aspart  0-15 Units Subcutaneous TID WC  . insulin aspart  0-5 Units Subcutaneous QHS  . insulin detemir  6 Units Subcutaneous Daily  . lidocaine (PF)      . pantoprazole  40 mg Oral BID  . piperacillin-tazobactam (ZOSYN)  IV  3.375 g Intravenous Q8H  . sodium chloride  3 mL Intravenous Q12H  . tamsulosin  0.4 mg Oral Daily   Continuous Infusions:    Principal Problem:   Sepsis ([place]) Active Problems:   Hypertension goal BP (blood pressure) < 140/90   Hyperlipidemia LDL goal <100   Chronic kidney disease (CKD), stage III (moderate)   Anemia of chronic renal failure   Acute renal failure (ARF) (HCC)   Transaminitis   Diabetes mellitus, insulin dependent (IDDM), uncontrolled (HCC)   Leukocytosis   Acute kidney injury ([place])   SOB (shortness of breath)   Liver abscess   Fever and chills   Gram-negative bacteremia (HCC)   Abdominal distention   Ileus (HCC)   Abscess, liver    Time spent: 35 min    Lucretia Pendley  [facility] Pager [phone] If 7PM-7AM, please contact night-coverage at [email or web address], password [facility] 08/03/2015, 1:35 PM  LOS: 16 days

## 2015-08-04 LAB — GLUCOSE, CAPILLARY
GLUCOSE-CAPILLARY: 105 mg/dL — AB (ref 65–99)
GLUCOSE-CAPILLARY: 147 mg/dL — AB (ref 65–99)

## 2015-08-04 LAB — CBC
HEMATOCRIT: 26.2 % — AB (ref 39.0–52.0)
HEMOGLOBIN: 8.7 g/dL — AB (ref 13.0–17.0)
MCH: 29.1 pg (ref 26.0–34.0)
MCHC: 33.2 g/dL (ref 30.0–36.0)
MCV: 87.6 fL (ref 78.0–100.0)
Platelets: 335 10*3/uL (ref 150–400)
RBC: 2.99 MIL/uL — AB (ref 4.22–5.81)
RDW: 16.8 % — ABNORMAL HIGH (ref 11.5–15.5)
WBC: 11.5 10*3/uL — ABNORMAL HIGH (ref 4.0–10.5)

## 2015-08-04 MED ORDER — SODIUM CHLORIDE 0.9 % IV SOLN: 1.0000 g | INTRAVENOUS | Status: AC

## 2015-08-04 MED ORDER — INSULIN DETEMIR 100 UNIT/ML FLEXPEN: 6.0000 [IU] | PEN_INJECTOR | Freq: Every day | SUBCUTANEOUS | Status: DC

## 2015-08-04 MED ORDER — PANTOPRAZOLE SODIUM 40 MG PO TBEC: 40.0000 mg | DELAYED_RELEASE_TABLET | Freq: Two times a day (BID) | ORAL | Status: DC

## 2015-08-04 MED ORDER — GUAIFENESIN-DM 100-10 MG/5ML PO SYRP: 5.0000 mL | ORAL_SOLUTION | ORAL | Status: DC | PRN

## 2015-08-04 MED ORDER — HEPARIN SOD (PORK) LOCK FLUSH 100 UNIT/ML IV SOLN
250.0000 [IU] | INTRAVENOUS | Status: AC | PRN
  Administered 2015-08-04: 250 [IU]

## 2015-08-04 MED ORDER — OXYCODONE HCL 5 MG PO TABS: 5.0000 mg | ORAL_TABLET | Freq: Four times a day (QID) | ORAL | Status: DC | PRN

## 2015-08-04 NOTE — Care Management (Signed)
Medication   ertapenem 1 g in sodium chloride 0.9 % 50 mL [[id number]]       ertapenem 1 g in sodium chloride 0.9 % 50 mL [id number]     Order Details    Dose: 1 g Route: Intravenous Frequency: Every 24 hours @ 100 mL/hr over 30 Minutes   Dispense Quantity:  8 g Refills:  0 Fills Remaining:  0          Sig: Inject 1 g into the vein daily.         Written Date:  08/04/15 Expiration Date:  08/03/16     Start Date:  08/04/15 End Date:  08/11/15     Ordering Provider:  Hosie Poisson, MD Authorizing Provider:  Hosie Poisson, MD Ordering User:  Hosie Poisson, MD                        Components     Component Ordered Dose Dispense Quantity    ertapenem 1 G Solr 1 g 1 g    sodium chloride 0.9 % Soln 50 mL 50 mL           Pharmacy:  [facility], [place] Comments:  --         Quantity Remaining:  0 g Quantity Filled:  0 g              Order Class     Print     All Administrations of ertapenem 1 g in sodium chloride 0.9 % 50 mL     No Administrations Recorded              Warnings History     No Interaction Warnings Shown      Acknowledgement Info     For At Acknowledged By Acknowledged On    Placing Order 08/04/15 [place], Agency 08/04/15 1246      Order History  Outpatient    Date/Time Action Taken User Additional Information    08/04/15 1204 Sign Hosie Poisson, MD ReorderfromOrder:[id number]    08/04/15 [place], RN New Order      Medication Detail       Disp Refills Start End      ertapenem 1 g in sodium chloride 0.9 % 50 mL 8 g 0 08/04/2015 08/11/2015     Sig - Route: Inject 1 g into the vein daily. - Intravenous     Class: Print

## 2015-08-04 NOTE — Discharge Summary (Signed)
Physician Discharge Summary  ABIEL ANTRIM TIW:[id number] DOB: 03/09/1934 DOA: 07/18/2015  PCP: [name], MD  Admit date: 07/18/2015 Discharge date: 08/04/2015  Time spent: 30 minutes  Recommendations for Outpatient Follow-up:  1. Follow up with IR drain clinic 2. Follow up with PCP in 1 rto 2 weeks.  3. Please follow up with Dr Watt Climes as recommended.    Discharge Diagnoses:  Principal Problem:   Sepsis ([place]) Active Problems:   Hypertension goal BP (blood pressure) < 140/90   Hyperlipidemia LDL goal <100   Chronic kidney disease (CKD), stage III (moderate)   Anemia of chronic renal failure   Acute renal failure (ARF) (HCC)   Transaminitis   Diabetes mellitus, insulin dependent (IDDM), uncontrolled (HCC)   Leukocytosis   Acute kidney injury ([place])   SOB (shortness of breath)   Liver abscess   Fever and chills   Gram-negative bacteremia (HCC)   Abdominal distention   Ileus (HCC)   Abscess, liver   Discharge Condition: improved  Diet recommendation: regular.   Filed Weights   07/31/15 2100 08/01/15 2100 08/02/15 2136  Weight: 76.9 kg (169 lb 8.5 oz) 76.1 kg (167 lb 12.3 oz) 76 kg (167 lb 8.8 oz)    History of present illness:  [name] is a 81 year old gentleman with a past medical history of diabetes mellitus, admitted to the medicine service on 07/18/2015 when he presented with complaints of generalized weakness, dizziness, chills.  He was found to be febrile having a temperature of 103 and started on broad-spectrum IV antimicrobial therapy with vancomycin and Zosyn.  CT scan of abdomen and pelvis performed on 07/20/2015 showed a hypoattenuating lesion within the liver. This was further worked up with an MRI of liver that showed findings compatible with abscess collection. Interventional radiology was consulted for drain placement that was performed on 07/21/2015. Blood cultures obtained on admission growing 1/4 with Prevotella, with repeat blood cultures  show no growth to date. Cultures from abscess growing gram-positive cocci. Awaiting organism identification and susceptibility testing. Hospital course complicated by ileus/PSBO, treated conservatively with NG decompression, bowel rest, supportive care. CCS consulted, ileus resolved now. 12/5: Repeat CT : liver abscess unchanged -12/5 with drop in hb to 6.8 and melena, SQ lovenox stopped, Iv PPI, 2 units PRBC given  -12/6: [facility] consulted, recommendations given. outpatient follow up. 12/8 : cxr shows worsening of the right pleural effusion 12/9 : right sided thoracentesis US guided.   Hospital Course:  1. Severe sepsis/Liver abscess -On 07/20/2015: CT scan of abdomen and pelvis performed revealing 7.1 x 5.0 x 5.7 cm hypoattenuating lesion within the liver. Radiology reporting that this could represent an abscess vs primary hepatic malignancy. This was further worked up with MRI of liver that demonstrated findings compatible with abscess collection.  -On 07/21/2015 blood cultures drawn from 07/18/2015 growing 1/4 with Prevotella. Ceftriaxone changed to Fortaz 1 g IV every 12 hours and Vancomycin discontinued. -11/26 Interventional radiology placed drain catheter of liver abscess . Gram stain showing gram positive cocci in pairs from abscess, but culture no growth 2 days -repeat Ct 12/5 without change in size of abscess, CCS has no further recs, d/w IR, felt that fluid in collection slightly better and may take a while to improve and recommended FU in [facility]. - improving WBC count, and changed zosyn to invanz, for possible d.c in am. Plan for 21 days of IV antibiotics total to complete the course.   2. Ileus -Overnight 11/29, patient developed multiple episodes of  nausea and vomiting and noted to have distended abdomen, abdominal x-ray that showed adynamic ileus. -s/p NG decompression,improved.  -tolerating diet -appreciate CCS consult  3. Acute blood loss anemia -Melena  x2 days and s/p 2 units PRBC last pm 12/5,  -continue Iv PPI q12, stopped SQ lovenox -[facility] consulted - Hb stable.  - outpatient follow up .    Acute on chronic renal failure -Likely secondary to ATN in setting of hypotension or perhaps sepsis/critical illness - improving.   5. Elevated troponin. -Lab work showing upward trend in his troponin to 0.30, no chest pain. -related to demand ischemia in setting of critical illness, Troponins trending down.  6. Diabetes mellitus -continue lantus,SSI CBG (last 3)   Recent Labs (last 2 labs)      Recent Labs  08/02/15 2134 08/03/15 0827 08/03/15 1201  GLUCAP 118* 123* 140*      Resume SSI.   7. History of hypertension. -He presented with hypotension for which antihypertensive medications were discontinued Resume on discharge.   8. Chronic diastolic CHF with volume overload which is iatrogenic -pleural effusion  9. Hypokalemia: replete as needed.   10. Worsening right pleural effusion; thoracentesis ordered for 12/9 am.  Further analysis of the pleural fluid ordered and negative so far.         Procedures:  US thoracentesis.  Consultations:  CCS  GI  IR  ID  Discharge Exam: Filed Vitals:   08/04/15 0513 08/04/15 0902  BP: 119/75 120/74  Pulse: 93 103  Temp: 99.4 F (37.4 C) 98.2 F (36.8 C)  Resp: 18 18    General: alert afebrile comfortable Cardiovascular: s1s2   Respiratory: diminshed at bases.   Discharge Instructions   Discharge Instructions    Discharge instructions    Complete by:  As directed   Please follow up with infectious disease as recommended in 1 to2 weeks .  Please follow up with IR drain clinic as recommended.  Please follow upw ith home health PT and RN as needed and recommended.  Please follow up with PCP in 1 to2 weeks and get BMP Checked to check your kidney function.          Current Discharge Medication List    START taking these medications    Details  ertapenem 1 g in sodium chloride 0.9 % 50 mL Inject 1 g into the vein daily. Qty: 8 g, Refills: 0    guaiFENesin-dextromethorphan (ROBITUSSIN DM) 100-10 MG/5ML syrup Take 5 mLs by mouth every 4 (four) hours as needed for cough. Qty: 118 mL, Refills: 0    oxyCODONE (OXY IR/ROXICODONE) 5 MG immediate release tablet Take 1 tablet (5 mg total) by mouth every 6 (six) hours as needed for breakthrough pain. Qty: 10 tablet, Refills: 0    pantoprazole (PROTONIX) 40 MG tablet Take 1 tablet (40 mg total) by mouth 2 (two) times daily. Qty: 60 tablet, Refills: 0      CONTINUE these medications which have CHANGED   Details  Insulin Detemir (LEVEMIR) 100 UNIT/ML Pen Inject 6 Units into the skin daily. Qty: 3 pen, Refills: 3   Associated Diagnoses: Type 2 diabetes, controlled, with renal manifestation ([place])      CONTINUE these medications which have NOT CHANGED   Details  aspirin EC 81 MG tablet Take 81 mg by mouth daily.    carvedilol (COREG) 6.25 MG tablet Take 1 tablet by mouth 2 (two) times daily.    docusate sodium (COLACE) 100 MG capsule Take 1  capsule (100 mg total) by mouth 2 (two) times daily. Qty: 60 capsule, Refills: 5   Associated Diagnoses: Irritable bowel syndrome with constipation    fenofibrate 160 MG tablet Take 1 tablet (160 mg total) by mouth daily. Qty: 30 tablet, Refills: 5    finasteride (PROSCAR) 5 MG tablet Take 1 tablet (5 mg total) by mouth daily. Qty: 90 tablet, Refills: 0    glimepiride (AMARYL) 2 MG tablet Take 1 tablet (2 mg total) by mouth 2 (two) times daily. Qty: 60 tablet, Refills: 5    Multiple Vitamin (MULTIVITAMIN WITH MINERALS) TABS tablet Take 1 tablet by mouth daily.    sitaGLIPtin (JANUVIA) 25 MG tablet Take 1 tablet (25 mg total) by mouth daily. Qty: 30 tablet, Refills: 5   Associated Diagnoses: Type 2 diabetes, controlled, with renal manifestation (HCC)    Tamsulosin HCl (FLOMAX) 0.4 MG CAPS Take 0.4 mg by mouth daily.    Blood  Glucose Monitoring Suppl (ONE TOUCH ULTRA 2) W/DEVICE KIT     Cholecalciferol (VITAMIN D3) 1000 UNITS CAPS Take 1 capsule by mouth daily.    diphenoxylate-atropine (LOMOTIL) 2.5-0.025 MG per tablet Take 1 tablet by mouth 4 (four) times daily as needed for diarrhea or loose stools. Qty: 30 tablet, Refills: 5   Associated Diagnoses: Irritable bowel syndrome with constipation    glucose blood test strip Use as instructed Qty: 300 each, Refills: 3   Associated Diagnoses: Type 2 diabetes, controlled, with renal manifestation (HCC)      STOP taking these medications     amLODipine (NORVASC) 10 MG tablet      quinapril (ACCUPRIL) 20 MG tablet      meloxicam (MOBIC) 7.5 MG tablet        Allergies  Allergen Reactions  . Niaspan [Niacin Er]   . Shellfish Allergy Other (See Comments)    Pt vomits  . Statins Other (See Comments)    Myalgia  . Zetia [Ezetimibe]    Follow-up Information    Follow up with Sharol Harness, MD On 08/14/2015.   Specialty:  Cardiology   Why:  Cardiology Hospital Follow-Up on 08/14/2015 at 10:45AM.   Contact information:   [address] [place] [address] [phone]       Follow up with [facility].   Why:  Home Health RN IV Antiobitcs will start 12/11 will also receive [facility] PT    Contact information:   [address] [place] [address] [phone]       Follow up with [facility] .   Contact information:   [facility] [phone] for appointment for IR drain care.       Follow up with Bobby Rumpf, MD.   Specialty:  Infectious Diseases   Why:  As needed   Contact information:   [place] [address] [phone]       Follow up with [name], MD. Schedule an appointment as soon as possible for a visit in 1 week.   Specialty:  Family Medicine   Why:  post hospitalization visit and also get BMP done.    Contact information:   [address]  [place] [address] [phone]       Follow up with [facility], MD. Schedule an appointment as soon as possible for a visit in 2 weeks.   Specialty:  Gastroenterology   Why:  to evaluate for black stools and for colonoscopy.  Contact information:   [address] [place] [address] [phone]        The results of significant diagnostics from this hospitalization (including imaging, microbiology, ancillary and laboratory) are listed below for reference.    Significant Diagnostic Studies: Ct Abdomen Pelvis Wo Contrast  07/20/2015  CLINICAL DATA:  Acute onset of renal failure. Severe sepsis. Hypotension. Initial encounter. EXAM: CT ABDOMEN AND PELVIS WITHOUT CONTRAST TECHNIQUE: Multidetector CT imaging of the abdomen and pelvis was performed following the standard protocol without IV contrast. COMPARISON:  Abdominal ultrasound performed 07/18/2015, and MRI of the abdomen performed 12/28/2013. CT of the abdomen from 11/28/2013 FINDINGS: A trace right-sided pleural effusion is noted. There is an unusual heterogeneously hypoattenuating lesion within the posterior right hepatic lobe, measuring 7.1 x 5.0 x 5.7 cm, new from 2015. Malignancy cannot be excluded, though given the patient's sepsis, underlying abscess might have a similar appearance. The gallbladder is within normal limits. The pancreas and adrenal glands are unremarkable. Mild left-sided perinephric stranding is noted, with trace fluid tracking about Gerota's fascia on the left. Mild right-sided perinephric stranding is also seen. A few nonobstructing bilateral renal stones are seen, measuring up to 5 mm in size. There is no evidence of hydronephrosis. No obstructing ureteral stones are identified. Small bowel loops measure up to 3.0 cm in diameter, borderline normal, without definite evidence for bowel obstruction. The stomach is within normal limits. No acute vascular abnormalities are seen. Relatively diffuse  calcification is noted along the abdominal aorta and its branches. The appendix is normal in caliber, without evidence of appendicitis. Scattered diverticulosis is noted along the ascending, descending and proximal sigmoid colon, without evidence of diverticulitis. The bladder is mildly distended and grossly unremarkable in appearance. The prostate remains normal in size. A small to moderate amount of free fluid within the pelvis is nonspecific and may be physiologic in nature. No inguinal lymphadenopathy is seen. No acute osseous abnormalities are identified. IMPRESSION: 1. Unusual large heterogeneous hypoattenuating lesion within the posterior right hepatic lobe, measuring 7.1 x 5.0 x 5.7 cm, new from 2015. Primary hepatic malignancy is a concern, though given the patient's sepsis and on this noncontrast study, underlying abscess might have a similar appearance. Would correlate with LFTs. Dynamic liver protocol MRI or CT is recommended for further evaluation. 2. New left-sided perinephric stranding and fluid tracking about Gerota's fascia raises question for mild left-sided pyelonephritis, given the patient's symptoms. Would correlate with urinalysis results. 3. Small to moderate amount of free fluid within the pelvis is nonspecific and may be physiologic in nature, or could reflect one of the processes above. 4. Few nonobstructing bilateral renal stones measure up to 5 mm in size. 5. Relatively diffuse calcification along the abdominal aorta and branches. 6. Scattered diverticulosis along the ascending, descending and proximal sigmoid colon, without evidence of diverticulitis. 7. Trace right-sided pleural effusion noted. Electronically Signed   By: Garald Balding M.D.   On: 07/20/2015 07:08   Dg Chest 1 View  08/03/2015  CLINICAL DATA:  Postop thoracentesis on the RIGHT. EXAM: CHEST 1 VIEW COMPARISON:  None. FINDINGS: Cardiomegaly. Normal mediastinal contours. PICC line tip proximal RIGHT atrium. Improved  RIGHT effusion status post thoracentesis. No pneumothorax. Hepatic drainage catheter unchanged. Mild BILATERAL subsegmental atelectasis and slight effusion on the LEFT. IMPRESSION: Improved RIGHT effusion status post thoracentesis.  No pneumothorax. Electronically Signed   By: Staci Righter M.D.   On: 08/03/2015 11:54   Dg Chest 1 View  07/23/2015  CLINICAL  DATA:  Episodes of shortness of breath this morning. EXAM: CHEST 1 VIEW COMPARISON:  07/19/2015 FINDINGS: Film is made with shallow lung inflation. Low lung volumes accentuate bronchovascular markings. Suspect small bilateral pleural effusions. There is bibasilar atelectasis. Early bilateral lower lobe infiltrates are not entirely excluded. Right upper quadrant coil catheter noted. IMPRESSION: 1. Shallow lung inflation. 2. Bilateral pleural effusions and bibasilar atelectasis or infiltrates. Electronically Signed   By: Nolon Nations M.D.   On: 07/23/2015 10:54   Dg Chest 2 View  08/02/2015  CLINICAL DATA:  Sob x 1 day, elevated troponin, ex smoker EXAM: CHEST  2 VIEW COMPARISON:  07/04/2015 FINDINGS: Right-sided PICC line tip overlies the level of the lower superior vena cava. The heart is enlarged. There are bilateral pleural effusions. Right basilar opacity is consistent with atelectasis or infiltrate. A pigtail type catheter is identified within the right upper quadrant of the abdomen. IMPRESSION: 1. Increased bibasilar atelectasis or infiltrates and effusions, right greater than left. 2. Interval placement of PICC line. Electronically Signed   By: Nolon Nations M.D.   On: 08/02/2015 15:17   Dg Chest 2 View  07/19/2015  CLINICAL DATA:  Fever and weakness EXAM: CHEST  2 VIEW COMPARISON:  Chest x-ray dated 07/18/2015. FINDINGS: Heart size is normal. Cardiomediastinal silhouette is stable in size and configuration. Mild atherosclerotic changes again noted at the aortic knob. Lungs are clear. Lung volumes are normal. Pulmonary vasculature appears  normal. No pleural effusions seen. No pneumothorax. No acute osseous abnormality. IMPRESSION: Lungs are clear and there is no evidence of acute cardiopulmonary abnormality. No pneumonia seen. Electronically Signed   By: Franki Cabot M.D.   On: 07/19/2015 08:14   Dg Chest 2 View  07/18/2015  CLINICAL DATA:  Leukocytosis. EXAM: CHEST  2 VIEW COMPARISON:  None. FINDINGS: The heart size and mediastinal contours are within normal limits. Both lungs are clear. The visualized skeletal structures are unremarkable. IMPRESSION: No active cardiopulmonary disease. Electronically Signed   By: Marijo Conception, M.D.   On: 07/18/2015 12:20   Dg Abd 1 View  07/27/2015  CLINICAL DATA:  ILEUS,NG TUBE.ABD DISCOMFORT EXAM: ABDOMEN - 1 VIEW COMPARISON:  07/26/2015 and CT 07/24/2015 FINDINGS: There is persistent significant dilatation of small bowel loops, with residual contrast in nondilated loops of colon. Colonic diverticula are present. There is no evidence for free intraperitoneal air on this supine view of the abdomen. A nasogastric tube is identified in the left upper quadrant overlying the region of the stomach. Liver abscess drain is partially imaged in the right upper quadrant. IMPRESSION: Pattern consistent with small bowel obstruction. Electronically Signed   By: Nolon Nations M.D.   On: 07/27/2015 08:20   Dg Abd 1 View  07/26/2015  CLINICAL DATA:  Ileus.  Abdominal discomfort. EXAM: ABDOMEN - 1 VIEW COMPARISON:  07/24/2015 FINDINGS: Distended small bowel loops continued of project over the abdomen. Abscess strain projects over the right upper quadrant. There is no obvious free intraperitoneal gas. There is contrast and gas in the colon. IMPRESSION: Stable ileus. Electronically Signed   By: Marybelle Killings M.D.   On: 07/26/2015 08:07   Dg Abd 1 View  07/24/2015  CLINICAL DATA:  Nasogastric tube placement EXAM: ABDOMEN - 1 VIEW COMPARISON:  None. FINDINGS: The tip of the nasogastric tube is in the fundus of the  stomach. Abscess strain projects over the right upper quadrant. Visualize lungs are grossly clear. Normal heart size. Gas-filled loops of bowel project over the upper abdomen.  IMPRESSION: NG tube placement into the fundus of the stomach by radiological technologist. Electronically Signed   By: Marybelle Killings M.D.   On: 07/24/2015 17:55   [place] Abscess Drain  07/21/2015  CLINICAL DATA:  81 year old male with right hepatic abscess EXAM: ULTRASOUND GUIDED ABSCESS DRAINAGE Date: 07/21/2015 PROCEDURE: 1. Ultrasound-guided placement of a transhepatic abscess drain into the hepatic abscess. Interventional Radiologist:  Criselda Peaches, MD ANESTHESIA/SEDATION: Moderate (conscious) sedation was used. 1 mg Versed, 50 mcg Fentanyl were administered intravenously. The patient's vital signs were monitored continuously by radiology nursing throughout the procedure. Sedation Time: 13 minutes MEDICATIONS: None additional.  Patient is on broad-spectrum IV antibiotics. TECHNIQUE: Informed consent was obtained from the patient following explanation of the procedure, risks, benefits and alternatives. The patient understands, agrees and consents for the procedure. All questions were addressed. A time out was performed. The right upper quadrant was interrogated with ultrasound. A complex multiloculated partially cystic structure is identified in the right hepatic lobe consistent with the clinical history of hepatic abscess. A suitable skin entry site was selected and marked. The region was sterilely prepped and draped in the standard fashion using chlorhexidine skin prep. Local anesthesia was attained by infiltration with 1% lidocaine. A small dermatotomy was made. Under real-time sonographic guidance, an 18 gauge 10 cm trocar needle was advanced into the most cystic portion of the collection. A 0.035 Amplatz wire was then coiled within the space. The skin tract was dilated to 64 [place] and a Cook 12 [place] all-purpose drainage  catheter advanced over the wire and formed within the hepatic abscess. Aspiration yields approximately 15 mL purulent bloody fluid. A sample was sent for culture. The abscess catheter was then secured to the skin with 0 Prolene suture and an adhesive fixation device an attached to JP bulb suction. The patient tolerated the procedure well. COMPLICATIONS: None Estimated blood loss:  0 IMPRESSION: Technically successful placement of a 12 French drainage catheter into the right hepatic abscess. Aspiration yielded approximately 15 mL purulent bloody fluid a sample of which was sent for culture. Signed, Criselda Peaches, MD [facility] Electronically Signed   By: Jacqulynn Cadet M.D.   On: 07/21/2015 16:45   US Abdomen Complete  07/18/2015  CLINICAL DATA:  Transaminitis. Additional history of diabetes, hypertension, renal disorder EXAM: ULTRASOUND ABDOMEN COMPLETE COMPARISON:  Abdominal MRI dated 12/28/2013 and CT abdomen dated 11/28/2013. FINDINGS: Gallbladder: Gallbladder is contracted which limits characterization of its walls but there is no evidence of gallbladder wall thickening or pericholecystic fluid. No gallstones seen. No sonographic Murphy's sign elicited, per the sonographer. Common bile duct: Diameter: Common bile duct within normal limits at 6 mm. No intrahepatic bile duct dilatation identified. Liver: No focal lesion identified. Within normal limits in parenchymal echogenicity. Main portal vein is shown to be patent with appropriate hepatopetal direction of blood flow. IVC: No abnormality visualized. Pancreas: Visualized portion unremarkable. Spleen: Size and appearance within normal limits. Right Kidney: Length: 10.2 cm. Right renal cortex thickness and overall echogenicity is within normal limits. Small cyst seen exophytic to the lower pole of the right kidney, also identified on earlier abdomen MRI. No suspicious mass or lesion within  the right kidney. No renal stone or hydronephrosis. Left Kidney: Length: 11.4 cm. Left renal cortex thickness and overall echogenicity is within normal limits. Cyst exophytic to the upper pole region measures 2.6 x 1.9 cm, compatible with the Bosniak 2 cyst described on previous MRI, similar in size. Additional  complex cyst within the midpole region measures 2 x 1.8 cm, also compatible with the Bosniak 2 cyst described on previous MRI, also similar in size. No new mass or cyst identified within the left kidney. No renal stone or hydronephrosis. Abdominal aorta: No aneurysm visualized. Distal portion of the abdominal aorta obscured by overlying bowel. Other findings: None.  No free fluid identified in the abdomen. IMPRESSION: 1. Gallbladder is contracted. Patient was not NPO for this exam. The gallbladder contraction limits characterization but there is no convincing gallbladder wall thickening, pericholecystic edema or other secondary signs of an acute cholecystitis. No gallstones seen. 2. No bile duct dilatation. 3. Liver appears normal. 4. Visualized portions of pancreas appear normal. No peripancreatic fluid. 5. Bilateral renal cysts, similar in appearance to the findings on abdomen MRI dated 12/28/2013. No acute renal abnormality seen. Electronically Signed   By: Franki Cabot M.D.   On: 07/18/2015 11:36   Ct Abdomen Pelvis W Contrast  07/30/2015  CLINICAL DATA:  Liver abscess. EXAM: CT ABDOMEN AND PELVIS WITH CONTRAST TECHNIQUE: Multidetector CT imaging of the abdomen and pelvis was performed using the standard protocol following bolus administration of intravenous contrast. CONTRAST:  163mL OMNIPAQUE IOHEXOL 300 MG/ML  SOLN COMPARISON:  CT scan of July 24, 2015. FINDINGS: Mild multilevel degenerative disc disease is noted in the lumbar spine. Moderate right pleural effusion is noted with adjacent subsegmental atelectasis. This is increased compared to prior exam. Stable mild left pleural effusion with  adjacent subsegmental atelectasis is noted. No gallstones are noted. Abscess seen in the posterior segment of the right hepatic lobe currently measures 7.8 x 7.7 x 4.8 cm. It is not significantly changed in size or appearance compared to prior exam. Stable positioning of drainage catheter is noted. The spleen and pancreas appear normal. Stable adrenal enlargement is noted compared to prior exam. Stable bilateral renal cysts are noted stable small nonobstructive calculus seen in lower pole collecting system of left kidney. No hydronephrosis or renal obstruction is noted. No renal or ureteral calculi are noted. Atherosclerosis of abdominal aorta is noted without aneurysm formation. The appendix appears normal. There is no evidence of bowel obstruction. No abnormal fluid collection is noted. Small fat containing periumbilical hernia is noted. Sigmoid diverticulosis is noted without inflammation. Urinary bladder appears normal. Mild amount of free fluid is noted posteriorly in the pelvis which is decreased compared to prior exam. Mild anasarca is noted. No significant adenopathy is noted. IMPRESSION: Moderate right pleural effusion is noted with adjacent subsegmental atelectasis which is slightly enlarged compared to prior exam. Stable mild left pleural effusion. No change seen in size or appearance of abscess seen in posterior segment of right hepatic lobe. Stable positioning of drainage catheter is noted. Atherosclerosis of abdominal aorta is noted without aneurysm formation. Sigmoid diverticulosis is noted without inflammation. Stable small fat containing periumbilical hernia is noted. Electronically Signed   By: Marijo Conception, M.D.   On: 07/30/2015 13:48   Ct Abdomen Pelvis W Contrast  07/24/2015  CLINICAL DATA:  Sepsis, liver abscess, drain placement, increasing white count now 19,000, with plain film evidence of ileus EXAM: CT ABDOMEN AND PELVIS WITH CONTRAST TECHNIQUE: Multidetector CT imaging of the  abdomen and pelvis was performed using the standard protocol following bolus administration of intravenous contrast. CONTRAST:  34mL OMNIPAQUE IOHEXOL 300 MG/ML  SOLN COMPARISON:  07/20/2015 FINDINGS: A small left and moderate right pleural effusion is present. Both are increased when compared to the prior study. There is anticipated bilateral  dependent consolidation most consistent with atelectasis. An NG tube extends into the stomach. Known liver abscess is again identified as an area of heterogeneous low attenuation with internal enhancing septations and gas. A drainage catheter passes through the abscess via percutaneous placement. Previously cross sectional diameter was 7.1 x 5.0 cm. Essentially unchanged measurements are obtained today. Spleen and pancreas are normal. Adrenal glands show mild fullness, stable. There are bilateral renal cysts. There is a 2 mm nonobstructing stone lower pole left kidney. Bladder is normal. There is fluid into the rectum. There is diverticulosis of the descending and sigmoid colon. There is fluid throughout small bowel. There is mild proximal small bowel dilatation to 3.7 cm, although there is no abrupt cut off or caliber change to suggest obstruction. Appendix appears normal. There is evidence of moderate anasarca. There is a small volume of ascites in the abdomen and pelvis. There are no acute musculoskeletal findings. IMPRESSION: Findings most consistent with small bowel ileus. Extensive left colon diverticulosis. Increased bilateral pleural effusions. Anasarca with small but increased volume of ascites. Drainage catheter again passes through right hepatic abscess. Abscess is unchanged in size. There is now all multifocal air within the abscess. This could represent necrosis or may be related to the presence of drainage catheter which has been placed in the interval. Electronically Signed   By: Skipper Cliche M.D.   On: 07/24/2015 22:34   Mr Liver W Wo  Contrast  07/20/2015  CLINICAL DATA:  81 year old male inpatient with sepsis and new right liver mass on noncontrast CT worrisome for liver abscess. EXAM: MRI ABDOMEN WITHOUT AND WITH CONTRAST TECHNIQUE: Multiplanar multisequence MR imaging of the abdomen was performed both before and after the administration of intravenous contrast. CONTRAST:  33mL MULTIHANCE GADOBENATE DIMEGLUMINE 529 MG/ML IV SOLN COMPARISON:  Unenhanced CT abdomen/ pelvis from earlier today. MRI abdomen from 12/28/2013. FINDINGS: Most of the MRI sequences are significantly motion degraded. Lower chest: Small right and trace left layering pleural effusions. Hepatobiliary: There is a complex 8.6 x 5.8 x 7.9 cm multilocular cystic liver mass centered at the junction of segments 6 and 7 of the right liver lobe, which demonstrates multiple thick irregular enhancing internal septations and irregular peripheral hyperenhancement and restricted diffusion, which was not detected on the 07/18/2015 abdominal sonogram from 2 days prior, and is new since the 12/28/2013 MRI study. No additional liver masses. There is mild diffuse hepatic steatosis on chemical shift imaging. Normal gallbladder with no cholelithiasis. No biliary ductal dilatation. Common bile duct diameter 4 mm. No choledocholithiasis. Pancreas: No pancreatic mass or duct dilation. No evidence of pancreas divisum. Spleen: Normal size. No mass. Adrenals/Urinary Tract: Normal right adrenal. Stable 1.1 cm left adrenal adenoma. No hydronephrosis. There are stable small parapelvic simple renal cysts in the lower renal sinus bilaterally. Stable Bosniak category 2 nonenhancing T1 hyperintense hemorrhagic/proteinaceous renal cyst in the lateral upper left kidney. Additional smaller simple renal cysts are present in both kidneys. Stomach/Bowel: Grossly normal stomach. Visualized small and large bowel is normal caliber, with no bowel wall thickening. Vascular/Lymphatic: Normal caliber abdominal aorta.  Patent portal, splenic, hepatic and renal veins. No pathologically enlarged lymph nodes in the abdomen. Other: No abdominal ascites or focal fluid collection. Musculoskeletal: No aggressive appearing focal osseous lesions. IMPRESSION: 1. Complex 8.6 x 5.8 x 7.9 cm multilocular cystic liver mass centered at the junction of segments 6 and 7 in the right liver lobe, with thick irregular enhancing internal septations, irregular peripheral hyperenhancement and restricted diffusion. Given the clinical  setting of sepsis, and given that this liver mass appears new since 12/28/2013 and possibly even new since the abdominal sonogram from 2 days prior, this liver mass is favored to represent an evolving liver abscess. A cystic primary liver neoplasm is less likely but not entirely excluded. 2. Small right and trace left layering pleural effusions. 3. Stable small left adrenal adenoma. 4. Stable small benign Bosniak category 1 and category 2 renal cysts. Electronically Signed   By: Ilona Sorrel M.D.   On: 07/20/2015 13:28   Dg Chest Port 1 View  07/19/2015  CLINICAL DATA:  Shortness of breath.  Fever. EXAM: PORTABLE CHEST 1 VIEW 1:54 p.m. COMPARISON:  07/19/2015 6:21 a.m. and 07/18/2015 FINDINGS: The heart size and mediastinal contours are within normal limits. Both lungs are clear. The visualized skeletal structures are unremarkable. IMPRESSION: No active disease. Electronically Signed   By: Lorriane Shire M.D.   On: 07/19/2015 14:00   Dg Chest Port 1 View  07/18/2015  CLINICAL DATA:  Wheezing, shortness of breath, cough, history of CHF. EXAM: PORTABLE CHEST 1 VIEW COMPARISON:  07/18/2015 FINDINGS: Slightly shallow inspiration. Normal heart size and pulmonary vascularity. Calcification of the aorta. Mediastinal contours appear intact. No focal airspace disease or consolidation in the lungs. No blunting of costophrenic angles. No pneumothorax. Degenerative changes in the shoulders. IMPRESSION: Shallow inspiration.  No  evidence of active pulmonary disease. Electronically Signed   By: Lucienne Capers M.D.   On: 07/18/2015 22:23   Dg Abd Acute W/chest  07/24/2015  CLINICAL DATA:  Abdominal pain and distention, onset tonight EXAM: DG ABDOMEN ACUTE W/ 1V CHEST COMPARISON:  07/21/2015 FINDINGS: There is abnormal dilatation and loop stacking of small bowel without significant mural thickening. No free intraperitoneal air. This may represent adynamic ileus. Obstruction not entirely excluded. The right hepatic lobe abscess drainage catheter appears unchanged in position. There are mild linear opacities in both lung bases which are probably atelectatic. There probably is a small right pleural effusion. IMPRESSION: 1. Abnormal dilatation of small bowel. Adynamic ileus is more likely than mechanical obstruction. 2. Unchanged position of the liver abscess drainage catheter. 3. Atelectatic appearing basilar lung opacities bilaterally. Probable small right pleural effusion. Electronically Signed   By: Andreas Newport M.D.   On: 07/24/2015 07:04   Dg Addison Bailey G Tube Plc W/fl-no Rad  07/24/2015  CLINICAL DATA:  NASO G TUBE PLACEMENT WITH FLUORO Fluoroscopy was utilized by the requesting physician.  No radiographic interpretation.   US Thoracentesis Asp Pleural Space W/img Guide  08/03/2015  Ardis Rowan, PA-C     08/03/2015 11:06 AM Successful US guided right thoracentesis. Yielded 900 mls of blood tinged fluid. Pt tolerated procedure well. No immediate complications. Specimen was sent for labs. CXR ordered. WENDY S BLAIR PA-C 08/03/2015 11:06 AM    Microbiology: Recent Results (from the past 240 hour(s))  Culture, body fluid-bottle     Status: None (Preliminary result)   Collection Time: 08/03/15 10:51 AM  Result Value Ref Range Status   Specimen Description FLUID PLEURAL RIGHT  Final   Special Requests NONE  Final   Culture NO GROWTH 1 DAY  Final   Report Status PENDING  Incomplete  Gram stain     Status: None    Collection Time: 08/03/15 10:51 AM  Result Value Ref Range Status   Specimen Description FLUID RIGHT PLEURAL  Final   Special Requests NONE  Final   Gram Stain   Final    MODERATE WBC PRESENT,BOTH PMN  AND MONONUCLEAR NO ORGANISMS SEEN    Report Status 08/03/2015 FINAL  Final     Labs: Basic Metabolic Panel:  Recent Labs Lab 07/29/15 0629 07/30/15 0531 07/31/15 1018 08/01/15 0425 08/02/15 1245  NA 151* 148* 144 142 140  K 3.8 3.2* 3.4* 3.4* 3.8  CL 117* 114* 110 107 104  CO2 $Re'25 26 25 26 27  'Uto$ GLUCOSE 110* 90 100* 90 168*  BUN 26* 21* $Remov'14 13 13  'CjjSPy$ CREATININE 1.28* 1.25* 1.22 1.32* 1.33*  CALCIUM 8.2* 7.8* 7.9* 8.0* 8.1*   Liver Function Tests:  Recent Labs Lab 07/30/15 0531 07/31/15 1018  AST 32 29  ALT 22 22  ALKPHOS 58 58  BILITOT 0.7 1.1  PROT 5.0* 5.3*  ALBUMIN 1.7* 1.8*   No results for input(s): LIPASE, AMYLASE in the last 168 hours. No results for input(s): AMMONIA in the last 168 hours. CBC:  Recent Labs Lab 07/31/15 1018 07/31/15 1830 08/01/15 0425 08/02/15 1245 08/04/15 0310  WBC 17.4* 16.9* 15.8* 14.1* 11.5*  HGB 9.2* 9.8* 9.3* 9.2* 8.7*  HCT 27.5* 29.6* 27.0* 27.9* 26.2*  MCV 84.9 84.8 84.9 86.6 87.6  PLT 348 370 344 340 335   Cardiac Enzymes: No results for input(s): CKTOTAL, CKMB, CKMBINDEX, TROPONINI in the last 168 hours. BNP: BNP (last 3 results) No results for input(s): BNP in the last 8760 hours.  ProBNP (last 3 results) No results for input(s): PROBNP in the last 8760 hours.  CBG:  Recent Labs Lab 08/03/15 0827 08/03/15 1201 08/03/15 1732 08/03/15 2102 08/04/15 0753  GLUCAP 123* 140* 70 84 105*       Signed:  Symphany Fleissner  [facility] 08/04/2015, 12:08 PM

## 2015-08-04 NOTE — Progress Notes (Signed)
IV antibiotic given at 1400.Discharged printed,explained and given to patient and his wife.Especial empahasis given to his jp drain care.Questions were answered satisfactorily at the time of discharged.

## 2015-08-04 NOTE — Progress Notes (Signed)
[facility]  Patient Status:   New pt for [facility] this admission  [facility] is providing the following services: HHRN and [facility] for home IV ABX.  Met with pt and son, spoke with wife by phone regarding POC for home IV  Zosyn.  Wife is comfortable learning to administer the IV ABX at home. [facility] is prepared for a weekend DC.  [facility] will need script for Zosyn and fax to [phone] or call Tiffany our weekend [facility] liaison.   If patient discharges after hours, please call [phone].   Larry Sierras 08/04/2015, 12:30 AM

## 2015-08-04 NOTE — Care Management (Signed)
CM consulted concerning discharge plan. Patient being discharge home on IV ABX switched to  [facility], [facility] notified order faxed and notified [facility] Liaison of discharge today after ABX therapy. Start of care will be 12/11.  CM faxed  orrder to [facility] [phone]. CM met  with patient and wife to verify  Discharge plan. PT also recommended, patient and wife are agreeable.  Discussed DME, patient will need 3 N1 and r/w. offered choice [facility] selected, [facility] liaison contact equipment will be delivered to room prior to discharge. Teach back done patient and family verbalized understanding. Updated Albert RN,  No further CM needs identified.

## 2015-08-05 LAB — PH, BODY FLUID: PH, BODY FLUID: 7.9

## 2015-08-06 ENCOUNTER — Other Ambulatory Visit (HOSPITAL_COMMUNITY): Payer: Self-pay | Admitting: General Surgery

## 2015-08-06 ENCOUNTER — Other Ambulatory Visit: Payer: Self-pay | Admitting: General Surgery

## 2015-08-06 ENCOUNTER — Other Ambulatory Visit: Payer: Self-pay | Admitting: Radiology

## 2015-08-06 DIAGNOSIS — K750 Abscess of liver: Secondary | ICD-10-CM

## 2015-08-08 LAB — CULTURE, BODY FLUID W GRAM STAIN -BOTTLE: Culture: NO GROWTH

## 2015-08-14 ENCOUNTER — Encounter: Payer: Medicare Other | Admitting: Cardiovascular Disease

## 2015-08-14 DIAGNOSIS — R0989 Other specified symptoms and signs involving the circulatory and respiratory systems: Secondary | ICD-10-CM

## 2015-08-14 NOTE — Progress Notes (Signed)
This encounter was created in error - please disregard.

## 2015-08-16 ENCOUNTER — Telehealth: Payer: Self-pay | Admitting: Family Medicine

## 2015-08-16 NOTE — Telephone Encounter (Signed)
Received call from [place] stating that patient has completed the antibiotics and the pick line has been pulled.  [facility] is wanting to know if they can discharge him

## 2015-08-21 ENCOUNTER — Encounter (HOSPITAL_COMMUNITY): Payer: Self-pay

## 2015-08-21 ENCOUNTER — Ambulatory Visit (HOSPITAL_COMMUNITY)
Admission: RE | Admit: 2015-08-21 | Discharge: 2015-08-21 | Disposition: A | Discharge status: Home / Self Care | Payer: Medicare Other | Source: Ambulatory Visit | Attending: General Surgery | Admitting: General Surgery

## 2015-08-21 ENCOUNTER — Ambulatory Visit (HOSPITAL_COMMUNITY)
Admission: RE | Admit: 2015-08-21 | Discharge: 2015-08-21 | Disposition: A | Discharge status: Home / Self Care | Payer: Medicare Other | Source: Ambulatory Visit | Attending: Radiology | Admitting: Radiology

## 2015-08-21 DIAGNOSIS — J90 Pleural effusion, not elsewhere classified: Secondary | ICD-10-CM | POA: Insufficient documentation

## 2015-08-21 DIAGNOSIS — I2510 Atherosclerotic heart disease of native coronary artery without angina pectoris: Secondary | ICD-10-CM | POA: Diagnosis not present

## 2015-08-21 DIAGNOSIS — K5730 Diverticulosis of large intestine without perforation or abscess without bleeding: Secondary | ICD-10-CM | POA: Diagnosis not present

## 2015-08-21 DIAGNOSIS — K750 Abscess of liver: Secondary | ICD-10-CM | POA: Diagnosis present

## 2015-08-21 DIAGNOSIS — N281 Cyst of kidney, acquired: Secondary | ICD-10-CM | POA: Insufficient documentation

## 2015-08-21 DIAGNOSIS — K429 Umbilical hernia without obstruction or gangrene: Secondary | ICD-10-CM | POA: Diagnosis not present

## 2015-08-21 DIAGNOSIS — I700 Atherosclerosis of aorta: Secondary | ICD-10-CM | POA: Diagnosis not present

## 2015-08-21 MED ORDER — IOHEXOL 300 MG/ML  SOLN
80.0000 mL | Freq: Once | INTRAMUSCULAR | Status: AC | PRN
  Administered 2015-08-21: 80 mL via INTRAVENOUS

## 2015-08-21 NOTE — Telephone Encounter (Signed)
Please call [place] and relay my message that patient may be discharged from home care services since leaving the hospital.

## 2015-08-22 ENCOUNTER — Encounter: Payer: Self-pay | Admitting: *Deleted

## 2015-09-03 ENCOUNTER — Inpatient Hospital Stay: Payer: Medicare Other | Admitting: Infectious Diseases

## 2015-09-07 ENCOUNTER — Ambulatory Visit: Payer: 59 | Admitting: Family Medicine

## 2015-09-11 ENCOUNTER — Other Ambulatory Visit: Payer: Self-pay | Admitting: Family Medicine

## 2015-09-12 ENCOUNTER — Inpatient Hospital Stay: Payer: Medicare Other | Admitting: Infectious Diseases

## 2015-09-17 ENCOUNTER — Ambulatory Visit (INDEPENDENT_AMBULATORY_CARE_PROVIDER_SITE_OTHER): Payer: Medicare Other | Admitting: Infectious Diseases

## 2015-09-17 ENCOUNTER — Encounter: Payer: Self-pay | Admitting: Infectious Diseases

## 2015-09-17 VITALS — BP 157/79 | HR 80 | Temp 97.7°F | Ht 70.5 in | Wt 163.5 lb

## 2015-09-17 DIAGNOSIS — K750 Abscess of liver: Secondary | ICD-10-CM | POA: Diagnosis not present

## 2015-09-17 DIAGNOSIS — E1122 Type 2 diabetes mellitus with diabetic chronic kidney disease: Secondary | ICD-10-CM

## 2015-09-17 NOTE — Assessment & Plan Note (Signed)
By his report of his A1C and his FSG he is doing very well.  Has had ophtho in last year.

## 2015-09-17 NOTE — Progress Notes (Signed)
   Subjective:    Patient ID: Brian [name], male    DOB: Aug 22, 1934, 82 y.o.   MRN: [id number]  HPI 82 yo M with hx of DM2, adm on 11-23 and was found to have liver abscess on imaging (8.6 x 5.8 x 7.9 cm multi locular cystic liver mass concerning for liver abscess). He was treated with vanco/zosyn --> zosyn until d/c, then sent home with invanz for 8 more days on 12-23. His BCx in hospital grew 1/4 Prevotella.  His liver drain Cx showed GPC on g/s but Cx was (-).   His course was also complicated by GI bleed in hospital.   Today he states he feels well. No f/c. No abd pain. Abd wounds are well healed.   Review of Systems  Constitutional: Negative for fever, chills, appetite change and unexpected weight change.  Gastrointestinal: Negative for diarrhea, constipation and blood in stool.  Genitourinary: Negative for difficulty urinating.   States his sugars have been regular, 98 this AM. A1C 7.  Has had his eyes checked in lat year.  Has had flu, pnvx, shingles, this season.     Objective:   Physical Exam  Constitutional: He appears well-developed and well-nourished.  HENT:  Mouth/Throat: No oropharyngeal exudate.  Eyes: EOM are normal. Pupils are equal, round, and reactive to light.  Neck: Neck supple.  Cardiovascular: Normal rate, regular rhythm and normal heart sounds.   Pulmonary/Chest: Breath sounds normal.  Abdominal: Soft. Bowel sounds are normal. He exhibits no mass. There is no tenderness. There is no rebound and no guarding.  Musculoskeletal: He exhibits no edema.  Lymphadenopathy:    He has no cervical adenopathy.      Assessment & Plan:

## 2015-09-17 NOTE — Assessment & Plan Note (Signed)
He is doing very well Would not repeat his CT abd unless he has sx- fever, chills, abd pain.  He will rtc prn.

## 2015-09-20 NOTE — Progress Notes (Signed)
Cardiology Office Note   Date:  09/21/2015   ID:  Brian [name], DOB 07-11-1934, MRN [id number]  PCP:  Brian Lime, MD  Cardiologist:   Brian Harness, MD   Chief Complaint  Patient presents with  . 2-4 WEEKS POST HOSPITAL    NO CHEST PAIN , NO SOB , NO SWELLING      History of Present Illness: Brian [name] is a 82 y.o. male with hypertension, diabetes mellitus, CKD 3, and hyperlipidemia who presents for follow up on demand ischemia.  Brian [name] was admitted to the hospital from 11/23 through 12/10 with liver abscesses and sepsis.  He was treated with vancomycin and zosyn in the hospital which was tailored to Zosyn prior to discharge and switched to ertapenem IV for 8 days at home.  During that hospitalization he had demand ischemia with a peak troponin on 1.08.   EKG was negative for ischemia and his echo revealed normal systolic function, grade 1 diastolic dysfunction and mild aortic stenosis.  He was instructed to follow-up with cardiology as an outpatient.  Brian [name] has been doing well since discharge. He denies any chest pain or shortness of breath. He followed up with infectious disease yesterday and does not need any further intervention.  He has not been getting much exercise.  He typically does yard work when it is warm out but does not do much in the winter. He denies lower extremity edema, orthopnea or PND.  His wife checks his blood pressure regularly at home and it has been in the 120-130s/80s.   Past Medical History  Diagnosis Date  . Type 2 diabetes mellitus ([place])   . Hypertension   . Hyperlipidemia   . Bladder stone   . History of bladder stone   . Nocturia   . Renal disorder   . Arthritis   . Aortic stenosis, mild 09/21/2015    Past Surgical History  Procedure Laterality Date  . Cystoscopy/retrograde/ureteroscopy/stone extraction with basket N/A 03/22/2013    Procedure: CYSTOSCOPY BLADDER STONE STONE EXTRACTION;  Surgeon: [name],  MD;  Location: [facility];  Service: Urology;  Laterality: N/A;  CYSTOSCOPY    . Tonsillectomy       Current Outpatient Prescriptions  Medication Sig Dispense Refill  . aspirin EC 81 MG tablet Take 81 mg by mouth daily. Reported on 09/17/2015    . B-D UF III MINI PEN NEEDLES 31G X 5 MM MISC USE AS DIRECTED WITH LEVEMIR INSULIN PENS AT BEDTIME 100 each 2  . Blood Glucose Monitoring Suppl (ONE TOUCH ULTRA 2) W/DEVICE KIT     . carvedilol (COREG) 6.25 MG tablet Take 1 tablet by mouth 2 (two) times daily.    . diphenoxylate-atropine (LOMOTIL) 2.5-0.025 MG per tablet Take 1 tablet by mouth 4 (four) times daily as needed for diarrhea or loose stools. 30 tablet 5  . docusate sodium (COLACE) 100 MG capsule Take 1 capsule (100 mg total) by mouth 2 (two) times daily. 60 capsule 5  . fenofibrate 160 MG tablet Take 1 tablet (160 mg total) by mouth daily. 30 tablet 5  . finasteride (PROSCAR) 5 MG tablet Take 1 tablet (5 mg total) by mouth daily. 90 tablet 0  . glimepiride (AMARYL) 2 MG tablet Take 1 tablet (2 mg total) by mouth 2 (two) times daily. 60 tablet 5  . glucose blood test strip Use as instructed 300 each 3  . Insulin Detemir (LEVEMIR) 100 UNIT/ML Pen Inject 6 Units  into the skin daily. (Patient taking differently: Inject 7 Units into the skin daily at 10 pm. ) 3 pen 3  . Multiple Vitamin (MULTIVITAMIN WITH MINERALS) TABS tablet Take 1 tablet by mouth daily. Reported on 09/17/2015    . sitaGLIPtin (JANUVIA) 25 MG tablet Take 1 tablet (25 mg total) by mouth daily. 30 tablet 5  . Tamsulosin HCl (FLOMAX) 0.4 MG CAPS Take 0.4 mg by mouth daily.    . pravastatin (PRAVACHOL) 10 MG tablet Take 1 tablet (10 mg total) by mouth every Monday, Wednesday, and Friday at 6 PM. 12 tablet 11   No current facility-administered medications for this visit.    Allergies:   Niaspan; Shellfish allergy; Statins; Sulfa antibiotics; and Zetia    Social History:  The patient  reports that he quit  smoking about 22 years ago. His smoking use included Cigarettes. He has a 29.25 pack-year smoking history. He does not have any smokeless tobacco history on file. He reports that he drinks alcohol. He reports that he does not use illicit drugs.   Family History:  The patient's family history is not on file.    ROS:  Please see the history of present illness.   Otherwise, review of systems are positive for weight loss.   All other systems are reviewed and negative.    PHYSICAL EXAM: VS:  BP 150/84 mmHg  Pulse 84  Ht '5\' 10"'$  (1.778 m)  Wt 73.936 kg (163 lb)  BMI 23.39 kg/m2 , BMI Body mass index is 23.39 kg/(m^2). GENERAL:  Well appearing HEENT:  Pupils equal round and reactive, fundi not visualized, oral mucosa unremarkable NECK:  No jugular venous distention, waveform within normal limits, carotid upstroke brisk and symmetric, no bruits, no thyromegaly LYMPHATICS:  No cervical adenopathy LUNGS:  Clear to auscultation bilaterally HEART:  RRR.  PMI not displaced or sustained,S1 and S2 within normal limits, no S3, no S4, no clicks, no rubs, III/VI early-peaking systolic murmur at the RUSB. ABD:  Flat, positive bowel sounds normal in frequency in pitch, no bruits, no rebound, no guarding, no midline pulsatile mass, no hepatomegaly, no splenomegaly EXT:  2 plus pulses throughout, no edema, no cyanosis no clubbing SKIN:  No rashes no nodules NEURO:  Cranial nerves II through XII grossly intact, motor grossly intact throughout PSYCH:  Cognitively intact, oriented to person place and time   EKG:  EKG is ordered today. The ekg ordered today demonstrates sinus rhythm. Rate 84 bpm.   Recent Labs: 07/18/2015: TSH 0.679 07/31/2015: ALT 22 08/02/2015: BUN 13; Creatinine, Ser 1.33*; Potassium 3.8; Sodium 140 08/04/2015: Hemoglobin 8.7*; Platelets 335    Lipid Panel No results found for: CHOL, TRIG, HDL, CHOLHDL, VLDL, LDLCALC, LDLDIRECT    Wt Readings from Last 3 Encounters:  09/21/15 73.936  kg (163 lb)  09/17/15 74.163 kg (163 lb 8 oz)  08/02/15 76 kg (167 lb 8.8 oz)      ASSESSMENT AND PLAN:  # Demand ischemia: Brian [name] had elevated troponin during hospitalization in the setting of sepsis.  He had no chest pain and his echo did not reveal a reduced LVEF or wall motion abnormalities.  Given the fact that he has not exerted himself since hospitalization, it is unclear whether or not he has exertional symptoms. Therefore, we will obtain an exercise Cardiolite to evaluate for ischemia. Continue aspirin and carvedilol.  # Hypertension:  Blood pressure is poorly controlled today. However, he states that it is well controlled at home.He and his wife will  check it daily for the next week and call us if it is over 140/90.  His ARB and amlodipine were held during hospitalization.  # Hyperlipidemia:  Given his diabetes, Mr. Mciver should be on a statin.  He has not tolerated rosuvastatin or atorvastatin due to myalgias.  We will try pravastatin 10 mg qMWF.  He has follow up with his PCP in 1 month.  We will repeat lipids in 3 months.  # Mild aortic stenosis: Asymptomatic.  Repeat echo in 1-2 years.   Current medicines are reviewed at length with the patient today.  The patient does not have concerns regarding medicines.  The following changes have been made:  Start pravastatin 10 mg qMWF.  Labs/ tests ordered today include:   Orders Placed This Encounter  Procedures  . Myocardial Perfusion Imaging  . EKG 12-Lead     Disposition:   FU with Quantavius Humm C. Oval Linsey, MD, [facility] in 3 months   This note was written with the assistance of speech recognition software.  Please excuse any transcriptional errors.  Signed, Brayant Dorr C. Oval Linsey, MD, [facility]  09/21/2015 12:50 PM    [facility]

## 2015-09-21 ENCOUNTER — Encounter: Payer: Self-pay | Admitting: Cardiovascular Disease

## 2015-09-21 ENCOUNTER — Ambulatory Visit (INDEPENDENT_AMBULATORY_CARE_PROVIDER_SITE_OTHER): Payer: Medicare Other | Admitting: Cardiovascular Disease

## 2015-09-21 VITALS — BP 150/84 | HR 84 | Ht 70.0 in | Wt 163.0 lb

## 2015-09-21 DIAGNOSIS — I350 Nonrheumatic aortic (valve) stenosis: Secondary | ICD-10-CM | POA: Diagnosis not present

## 2015-09-21 DIAGNOSIS — I248 Other forms of acute ischemic heart disease: Secondary | ICD-10-CM | POA: Diagnosis not present

## 2015-09-21 DIAGNOSIS — I119 Hypertensive heart disease without heart failure: Secondary | ICD-10-CM

## 2015-09-21 DIAGNOSIS — I214 Non-ST elevation (NSTEMI) myocardial infarction: Secondary | ICD-10-CM

## 2015-09-21 DIAGNOSIS — E785 Hyperlipidemia, unspecified: Secondary | ICD-10-CM

## 2015-09-21 HISTORY — DX: Nonrheumatic aortic (valve) stenosis: I35.0

## 2015-09-21 MED ORDER — PRAVASTATIN SODIUM 10 MG PO TABS: 10.0000 mg | ORAL_TABLET | ORAL | Status: DC

## 2015-09-21 NOTE — Patient Instructions (Signed)
CHECK BLOOD PRESSURE FOR NEXT 1 WEEK , CALL IF BLOOD PRESSURE IS GREATER 140/90.  Your physician has requested that you have en exercise stress myoview. For further information please visit [email or web address]. Please follow instruction sheet, as given.  WILL CALL YOU WITH RESULTS   START PRAVASTATIN 10 MG  ONE TABLET MONDAY -WED-Friday AT SUPPERTIME.  Your physician recommends that you schedule a follow-up appointment in [place].

## 2015-09-28 ENCOUNTER — Ambulatory Visit (HOSPITAL_COMMUNITY)
Admission: RE | Admit: 2015-09-28 | Discharge: 2015-09-28 | Disposition: A | Discharge status: Home / Self Care | Payer: Medicare Other | Source: Ambulatory Visit | Attending: Cardiovascular Disease | Admitting: Cardiovascular Disease

## 2015-09-28 DIAGNOSIS — I248 Other forms of acute ischemic heart disease: Secondary | ICD-10-CM

## 2015-09-28 DIAGNOSIS — Z87891 Personal history of nicotine dependence: Secondary | ICD-10-CM | POA: Diagnosis not present

## 2015-09-28 DIAGNOSIS — I214 Non-ST elevation (NSTEMI) myocardial infarction: Secondary | ICD-10-CM | POA: Diagnosis not present

## 2015-09-28 DIAGNOSIS — E1122 Type 2 diabetes mellitus with diabetic chronic kidney disease: Secondary | ICD-10-CM | POA: Insufficient documentation

## 2015-09-28 DIAGNOSIS — N183 Chronic kidney disease, stage 3 (moderate): Secondary | ICD-10-CM | POA: Diagnosis not present

## 2015-09-28 DIAGNOSIS — I129 Hypertensive chronic kidney disease with stage 1 through stage 4 chronic kidney disease, or unspecified chronic kidney disease: Secondary | ICD-10-CM | POA: Diagnosis not present

## 2015-09-28 LAB — MYOCARDIAL PERFUSION IMAGING
CHL CUP MPHR: 140 {beats}/min
CHL CUP NUCLEAR SDS: 0
CHL CUP NUCLEAR SRS: 1
CHL RATE OF PERCEIVED EXERTION: 17
CSEPEDS: 30 s
CSEPEW: 5.2 METS
CSEPHR: 100 %
Exercise duration (min): 4 min
LV sys vol: 54 mL
LVDIAVOL: 110 mL
NUC STRESS TID: 1.15
Peak HR: 141 {beats}/min
Rest HR: 65 {beats}/min
SSS: 1

## 2015-09-28 MED ORDER — TECHNETIUM TC 99M SESTAMIBI GENERIC - CARDIOLITE
10.7000 | Freq: Once | INTRAVENOUS | Status: AC | PRN
  Administered 2015-09-28: 11 via INTRAVENOUS

## 2015-09-28 MED ORDER — TECHNETIUM TC 99M SESTAMIBI GENERIC - CARDIOLITE
30.6000 | Freq: Once | INTRAVENOUS | Status: AC | PRN
  Administered 2015-09-28: 31 via INTRAVENOUS

## 2015-10-01 ENCOUNTER — Encounter: Payer: Self-pay | Admitting: Family Medicine

## 2015-10-23 ENCOUNTER — Encounter: Payer: Self-pay | Admitting: Family Medicine

## 2015-10-23 ENCOUNTER — Telehealth: Payer: Self-pay | Admitting: Cardiovascular Disease

## 2015-10-23 ENCOUNTER — Ambulatory Visit (INDEPENDENT_AMBULATORY_CARE_PROVIDER_SITE_OTHER): Payer: 59 | Admitting: Family Medicine

## 2015-10-23 VITALS — BP 130/60 | HR 45 | Temp 98.1°F | Resp 13 | Ht 70.0 in | Wt 172.3 lb

## 2015-10-23 DIAGNOSIS — I10 Essential (primary) hypertension: Secondary | ICD-10-CM

## 2015-10-23 DIAGNOSIS — E785 Hyperlipidemia, unspecified: Secondary | ICD-10-CM

## 2015-10-23 DIAGNOSIS — E1129 Type 2 diabetes mellitus with other diabetic kidney complication: Secondary | ICD-10-CM | POA: Diagnosis not present

## 2015-10-23 DIAGNOSIS — R001 Bradycardia, unspecified: Secondary | ICD-10-CM | POA: Diagnosis not present

## 2015-10-23 DIAGNOSIS — I350 Nonrheumatic aortic (valve) stenosis: Secondary | ICD-10-CM

## 2015-10-23 LAB — POCT GLYCOSYLATED HEMOGLOBIN (HGB A1C): HEMOGLOBIN A1C: 6.9

## 2015-10-23 NOTE — Progress Notes (Signed)
Name: MIN COLLYMORE   MRN: [id number]    DOB: 1933-10-18   Date:10/23/2015       Progress Note  Subjective  Chief Complaint  Chief Complaint  Patient presents with  . Medication Refill  . Diabetes    Checks BG 3x day low-91, high-134  . Hypertension  . Hyperlipidemia    HPI  Brian [name] is a 82 y.o. male with hypertension, diabetes mellitus, CKD 3, and hyperlipidemia who presents for follow up of general medical conditions and after long hospitalization. Mr. Lefevers was admitted to the hospital from 11/23 through 12/10 with liver abscesses and sepsis. He was treated with vancomycin and zosyn in the hospital which was tailored to Zosyn prior to discharge and switched to ertapenem IV for 8 days at home. During that hospitalization he had demand ischemia with a peak troponin on 1.08. EKG was negative for ischemia and his echo revealed normal systolic function, grade 1 diastolic dysfunction and mild aortic stenosis.  He has since followed up with Cardiologist and ID specialist.   Mr. Morten has been doing well since discharge. He denies any chest pain or shortness of breath. He denies lower extremity edema, orthopnea or PND. His wife checks his blood pressure regularly at home.  During hospitalization his ACE and amlodipine were held and he has been checking BP at home. He reports that he has restarted Quinapril 20 mg bid about 2 weeks ago with no problems. HAS NOT TAKING COREG 6.25 mg THIS MORNING. Last took Coreg 10/22/15 evening.   He was also having intolerant myalgias to rosuvastatin and atorvastatin so cardiologist Dr. Skeet Latch has placed him of pravastatin 10 mg q MWF. He reports he has not picked up this medication yet.   Continues to take Januvia, Glimepiride and Levemir 4 units for DM II. Glucose well controled at home.  Past Medical History  Diagnosis Date  . Type 2 diabetes mellitus ([place])   . Hypertension   . Hyperlipidemia   . Bladder stone   .  History of bladder stone   . Nocturia   . Renal disorder   . Arthritis   . Aortic stenosis, mild 09/21/2015    Patient Active Problem List   Diagnosis Date Noted  . Bradycardia with 41 - 50 beats per minute 10/23/2015  . Aortic stenosis, mild 09/21/2015  . Abscess, liver   . Abdominal distention   . Ileus ([place])   . Fever and chills   . Gram-negative bacteremia ([place])   . Acute kidney injury ([place])   . SOB (shortness of breath)   . Liver abscess   . Acute renal failure (ARF) ([place]) 07/18/2015  . Transaminitis 07/18/2015  . Leukocytosis 07/18/2015  . Arterial hypotension   . Hypertension goal BP (blood pressure) < 140/90 05/08/2015  . Hyperlipidemia LDL goal <100 05/08/2015  . Diverticulosis of colon 05/08/2015  . Secondary hyperparathyroidism of renal origin ([place]) 05/08/2015  . Chronic kidney disease (CKD), stage III (moderate) 05/08/2015  . Irritable bowel syndrome with constipation 05/08/2015  . Anemia of chronic renal failure 05/08/2015  . Benign localized prostatic hyperplasia with lower urinary tract symptoms (LUTS) 05/08/2015  . Type 2 diabetes, controlled, with renal manifestation ([place]) 03/28/2015    Social History  Substance Use Topics  . Smoking status: Former Smoker -- 0.75 packs/day for 39 years    Types: Cigarettes    Quit date: 03/21/1993  . Smokeless tobacco: Not on file  . Alcohol Use: 0.0 oz/week    0  Standard drinks or equivalent per week     Current outpatient prescriptions:  .  quinapril (ACCUPRIL) 20 MG tablet, Take 20 mg by mouth 2 (two) times daily., Disp: , Rfl:  .  aspirin EC 81 MG tablet, Take 81 mg by mouth daily. Reported on 09/17/2015, Disp: , Rfl:  .  B-D UF III MINI PEN NEEDLES 31G X 5 MM MISC, USE AS DIRECTED WITH LEVEMIR INSULIN PENS AT BEDTIME, Disp: 100 each, Rfl: 2 .  Blood Glucose Monitoring Suppl (ONE TOUCH ULTRA 2) W/DEVICE KIT, , Disp: , Rfl:  .  carvedilol (COREG) 6.25 MG tablet, Take 1 tablet by mouth 2 (two) times daily., Disp: ,  Rfl:  .  docusate sodium (COLACE) 100 MG capsule, Take 1 capsule (100 mg total) by mouth 2 (two) times daily., Disp: 60 capsule, Rfl: 5 .  fenofibrate 160 MG tablet, Take 1 tablet (160 mg total) by mouth daily., Disp: 30 tablet, Rfl: 5 .  finasteride (PROSCAR) 5 MG tablet, Take 1 tablet (5 mg total) by mouth daily., Disp: 90 tablet, Rfl: 0 .  glimepiride (AMARYL) 2 MG tablet, Take 1 tablet (2 mg total) by mouth 2 (two) times daily., Disp: 60 tablet, Rfl: 5 .  glucose blood test strip, Use as instructed, Disp: 300 each, Rfl: 3 .  Insulin Detemir (LEVEMIR) 100 UNIT/ML Pen, Inject 6 Units into the skin daily. (Patient taking differently: Inject 4 Units into the skin daily at 10 pm. ), Disp: 3 pen, Rfl: 3 .  Multiple Vitamin (MULTIVITAMIN WITH MINERALS) TABS tablet, Take 1 tablet by mouth daily. Reported on 09/17/2015, Disp: , Rfl:  .  nystatin-triamcinolone (MYCOLOG II) cream, , Disp: , Rfl:  .  sitaGLIPtin (JANUVIA) 25 MG tablet, Take 1 tablet (25 mg total) by mouth daily., Disp: 30 tablet, Rfl: 5 .  Tamsulosin HCl (FLOMAX) 0.4 MG CAPS, Take 0.4 mg by mouth daily., Disp: , Rfl:   Past Surgical History  Procedure Laterality Date  . Cystoscopy/retrograde/ureteroscopy/stone extraction with basket N/A 03/22/2013    Procedure: CYSTOSCOPY BLADDER STONE STONE EXTRACTION;  Surgeon: [name], MD;  Location: [facility];  Service: Urology;  Laterality: N/A;  CYSTOSCOPY    . Tonsillectomy      Family History  Problem Relation Age of Onset  . Congestive Heart Failure      Allergies  Allergen Reactions  . Niaspan [Niacin Er]   . Shellfish Allergy Other (See Comments)    Pt vomits  . Statins Other (See Comments)    Myalgia  . Sulfa Antibiotics   . Zetia [Ezetimibe]      Review of Systems  CONSTITUTIONAL: No significant weight changes, fever, chills, weakness. Yes fatigue.  HEENT:  - Eyes: No visual changes.  - Ears: No auditory changes. No pain.  - Nose: No sneezing,  congestion, runny nose. - Throat: No sore throat. No changes in swallowing. SKIN: No rash or itching.  CARDIOVASCULAR: No chest pain, chest pressure or chest discomfort. No palpitations or edema.  RESPIRATORY: No shortness of breath, cough or sputum.  GASTROINTESTINAL: No anorexia, nausea, vomiting. No changes in bowel habits. No abdominal pain or blood.  GENITOURINARY: No dysuria. No frequency. No discharge. NEUROLOGICAL: No headache, dizziness, syncope, paralysis, ataxia, numbness or tingling in the extremities. No memory changes. No change in bowel or bladder control.  MUSCULOSKELETAL: No joint pain. No muscle pain. HEMATOLOGIC: No anemia, bleeding or bruising.  LYMPHATICS: No enlarged lymph nodes.  PSYCHIATRIC: No change in mood. No change in sleep  pattern.  ENDOCRINOLOGIC: No reports of sweating, cold or heat intolerance. No polyuria or polydipsia.     Objective  BP 130/60 mmHg  Pulse 45  Temp(Src) 98.1 F (36.7 C) (Oral)  Resp 13  Ht '5\' 10"'$  (1.778 m)  Wt 172 lb 4.8 oz (78.155 kg)  BMI 24.72 kg/m2  SpO2 98% Body mass index is 24.72 kg/(m^2).  Physical Exam  Constitutional: Patient appears well-developed and well-nourished. In no distress.  HEENT:  - Head: Normocephalic and atraumatic.  - Ears: Bilateral TMs gray, no erythema or effusion - Nose: Nasal mucosa moist - Mouth/Throat: Oropharynx is clear and moist. No tonsillar hypertrophy or erythema. No post nasal drainage.  - Eyes: Conjunctivae clear, EOM movements normal. PERRLA. No scleral icterus.  Neck: Normal range of motion. Neck supple. No JVD present. No thyromegaly present.  Cardiovascular: Bradycardia, regular rhythm and 2/6 SEM best heard right of sternum. Pulmonary/Chest: Effort normal and breath sounds normal. No respiratory distress. Musculoskeletal: Normal range of motion bilateral UE and LE, no joint effusions. Peripheral vascular: Bilateral LE no edema. Neurological: CN II-XII grossly intact with no focal  deficits. Alert and oriented to person, place, and time. Coordination, balance, strength, speech and gait are normal.  Skin: Skin is warm and dry. No rash noted. No erythema.  Psychiatric: Patient has a normal mood and affect. Behavior is normal in office today. Judgment and thought content normal in office today.   Results for orders placed or performed in visit on 10/23/15 (from the past 24 hour(s))  POCT HgB A1C     Status: Abnormal   Collection Time: 10/23/15  8:54 AM  Result Value Ref Range   Hemoglobin A1C 6.9     Assessment & Plan   1. Controlled type 2 diabetes mellitus with other diabetic kidney complication (HCC)  Patient's Hba1c goal is <7% is acceptable.  Patient's Hba1c goal is <8% as this is reasonable for the elderly population who is at risk for hypoglycemic events.   Encouraged patient to continue efforts on checking blood glucose on a daily basis. Fasting blood glucose control goal is 80-'130mg'$ /dL and post prandial blood glucose control is '180mg'$ /dL.  Reviewed diet, exercise, lifestyle changes and current medication regimen pertaining to diabetes with the patient.   Reminded patient of the required annual dilated retinal exam.    - POCT HgB A1C  2. Hypertension goal BP (blood pressure) < 140/90 Well controled.   3. Aortic stenosis, mild Stable heart murmur.   4. Hyperlipidemia LDL goal <100 Has not started statin yet.   5. Bradycardia with 41 - 50 beats per minute Asymptomatic in office. Spoke with cardiologist Dr. Oval Linsey over the phone and we are in agreement to hold Coreg for now, ER precautions provided to patient, he is to check BP and HR at home, close f/u with Cardiology office this week.   EKG shows sinus brady at 45 BMP with bigeminy PVCs. No ST segment changes or abnormal T wave changes.

## 2015-10-23 NOTE — Patient Instructions (Signed)
1) Dr. Oval Linsey has sent a new medication Pravastatin 20mg  one at bedtime every night for cholesterol. Pick up at pharmacy.  2) Stop taking Carvedilol 6.25 mg twice a day and keep checking heart rate and blood pressure at home. Heart rate should be greater than 60 and blood pressure shoulder be lower than 140/90.

## 2015-10-26 NOTE — Telephone Encounter (Signed)
PATIENT RECEIVED RESULT BY CHERYL ,LPN

## 2015-10-26 NOTE — Telephone Encounter (Signed)
Pt is calling it get the results to his stress test. Please f/u with pt  Thanks

## 2015-10-30 ENCOUNTER — Other Ambulatory Visit: Payer: Self-pay | Admitting: Family Medicine

## 2015-11-01 ENCOUNTER — Telehealth: Payer: Self-pay | Admitting: *Deleted

## 2015-11-01 NOTE — Telephone Encounter (Signed)
SPOKE TO PATIENT AND WIFE  STATES THAT PATIENT HE DOES NOT NEED AN APPOINTMENT ON Thursday or Friday anyday will do.  appointment schedule 11/06/15- at 9:30 with Dr Oval Linsey.

## 2015-11-01 NOTE — Telephone Encounter (Signed)
-----   Message from Skeet Latch, MD sent at 10/23/2015  9:22 AM EST ----- Please arrange for [name] to be seen in clinic either Thursday or Friday by me or one of the PAs.  He needs to follow up on bradycardia noted in clinic today.

## 2015-11-05 NOTE — Progress Notes (Signed)
Cardiology Office Note   Date:  11/06/2015   ID:  [name], DOB [birth year], MRN [id number]  PCP:  [name], MD  Cardiologist:   Sharol Harness, MD   Chief Complaint  Patient presents with  . Follow-up    3 month--stress test, med change  pt states he occasionally feels his heart beating really fast, no other Sx.    Patient ID: Brian [name] is a 82 y.o. male with hypertension, diabetes mellitus, CKD 3, and hyperlipidemia who presents for follow up on demand ischemia.    Interval History 11/06/15: After his last appointment Mr. Swallows underwent exercise Cardiolite that was negative for ischemia.  He was asked to keep a log of his blood pressure given that it was elevated in the clinic.  He was also started on pravastatin, given his prior intolerance to statins.  He followed up with Dr. Nadine Counts and reported that he had not yet picked up the medication.  [name] was also noted to be bradycardic to 45 bpm at that appointment on 2/28. He was asymptomatic but carvedilol was held.  Since then he has been feeling well.  He denies any lightheadedness or dizziness.  He does occasionally note fast heart rates that last for a few seconds.  He is unable to articulate how often this happens but it seems to be less than once per month.  He denies any sustained arrhythmias.  [name] denies chest pain, lower extremity edema, orthopnea or PND.   History of Present Illness 08/2715: [name] was admitted to the hospital from 11/23 through 12/10 with liver abscesses and sepsis.  He was treated with vancomycin and zosyn in the hospital which was tailored to Zosyn prior to discharge and switched to ertapenem IV for 8 days at home.  During that hospitalization he had demand ischemia with a peak troponin on 1.08.   EKG was negative for ischemia and his echo revealed normal systolic function, grade 1 diastolic dysfunction and mild aortic stenosis.  He was instructed to follow-up  with cardiology as an outpatient.  [name] has been doing well since discharge. He denies any chest pain or shortness of breath. He followed up with infectious disease yesterday and does not need any further intervention.  He has not been getting much exercise.  He typically does yard work when it is warm out but does not do much in the winter. He denies lower extremity edema, orthopnea or PND.  His wife checks his blood pressure regularly at home and it has been in the 120-130s/80s.   Past Medical History  Diagnosis Date  . Type 2 diabetes mellitus ([place])   . Hypertension   . Hyperlipidemia   . Bladder stone   . History of bladder stone   . Nocturia   . Renal disorder   . Arthritis   . Aortic stenosis, mild 09/21/2015    Past Surgical History  Procedure Laterality Date  . Cystoscopy/retrograde/ureteroscopy/stone extraction with basket N/A 03/22/2013    Procedure: CYSTOSCOPY BLADDER STONE STONE EXTRACTION;  Surgeon: [name], MD;  Location: [facility];  Service: Urology;  Laterality: N/A;  CYSTOSCOPY    . Tonsillectomy       Current Outpatient Prescriptions  Medication Sig Dispense Refill  . aspirin EC 81 MG tablet Take 81 mg by mouth daily. Reported on 09/17/2015    . B-D UF III MINI PEN NEEDLES 31G X 5 MM MISC USE AS DIRECTED WITH LEVEMIR INSULIN  PENS AT BEDTIME 100 each 2  . Blood Glucose Monitoring Suppl (ONE TOUCH ULTRA 2) W/DEVICE KIT     . docusate sodium (COLACE) 100 MG capsule Take 1 capsule (100 mg total) by mouth 2 (two) times daily. 60 capsule 5  . finasteride (PROSCAR) 5 MG tablet Take 1 tablet (5 mg total) by mouth daily. 90 tablet 0  . glimepiride (AMARYL) 2 MG tablet Take 1 tablet (2 mg total) by mouth 2 (two) times daily. 60 tablet 5  . glucose blood test strip Use as instructed 300 each 3  . Insulin Detemir (LEVEMIR) 100 UNIT/ML Pen Inject 6 Units into the skin daily. (Patient taking differently: Inject 4 Units into the skin daily at 10  pm. ) 3 pen 3  . quinapril (ACCUPRIL) 20 MG tablet Take 20 mg by mouth 2 (two) times daily.    . quinapril (ACCUPRIL) 20 MG tablet TAKE ONE (1) TABLET BY MOUTH TWO (2) TIMES DAILY 180 tablet 1  . sitaGLIPtin (JANUVIA) 25 MG tablet Take 1 tablet (25 mg total) by mouth daily. 30 tablet 5  . Tamsulosin HCl (FLOMAX) 0.4 MG CAPS Take 0.4 mg by mouth daily.    . pravastatin (PRAVACHOL) 10 MG tablet 1 TABLET BY MOUTH ON Monday, Wednesday, AND Friday ONLY 15 tablet 5   No current facility-administered medications for this visit.    Allergies:   Carvedilol; Niaspan; Shellfish allergy; Statins; Sulfa antibiotics; and Zetia    Social History:  The patient  reports that he quit smoking about 22 years ago. His smoking use included Cigarettes. He has a 29.25 pack-year smoking history. He does not have any smokeless tobacco history on file. He reports that he drinks alcohol. He reports that he does not use illicit drugs.   Family History:  The patient's family history is not on file.    ROS:  Please see the history of present illness.   Otherwise, review of systems are positive for weight loss.   All other systems are reviewed and negative.    PHYSICAL EXAM: VS:  BP 140/80 mmHg  Pulse 88  Ht 5\' 10"  (1.778 m)  Wt 77.792 kg (171 lb 8 oz)  BMI 24.61 kg/m2 , BMI Body mass index is 24.61 kg/(m^2). GENERAL:  Well appearing HEENT:  Pupils equal round and reactive, fundi not visualized, oral mucosa unremarkable NECK:  No jugular venous distention, waveform within normal limits, carotid upstroke brisk and symmetric, no bruits LYMPHATICS:  No cervical adenopathy LUNGS:  Clear to auscultation bilaterally HEART:  RRR.  PMI not displaced or sustained,S1 and S2 within normal limits, no S3, no S4, no clicks, no rubs, III/VI early-peaking systolic murmur at the RUSB. ABD:  Flat, positive bowel sounds normal in frequency in pitch, no bruits, no rebound, no guarding, no midline pulsatile mass, no hepatomegaly, no  splenomegaly EXT:  2 plus pulses throughout, no edema, no cyanosis no clubbing SKIN:  No rashes no nodules NEURO:  Cranial nerves II through XII grossly intact, motor grossly intact throughout PSYCH:  Cognitively intact, oriented to person place and time   EKG:  EKG is not ordered today.  Exercise Cardiolite 09/28/15:  Nuclear stress EF: 51%.  The left ventricular ejection fraction is mildly decreased (45-54%).  There was no ST segment deviation noted during stress.  The study is normal.  Normal stress nuclear study with no ischemia or infarction; low normal LV function with EF 51 and normal wall motion.  Recent Labs: 07/18/2015: TSH 0.679 07/31/2015: ALT 22  08/02/2015: BUN 13; Creatinine, Ser 1.33*; Potassium 3.8; Sodium 140 08/04/2015: Hemoglobin 8.7*; Platelets 335    Lipid Panel No results found for: CHOL, TRIG, HDL, CHOLHDL, VLDL, LDLCALC, LDLDIRECT    Wt Readings from Last 3 Encounters:  11/06/15 77.792 kg (171 lb 8 oz)  10/23/15 78.155 kg (172 lb 4.8 oz)  09/28/15 73.936 kg (163 lb)      ASSESSMENT AND PLAN:  # Bradycardia: Heart rate has increased since stopping carvedilol.  He remains asymptomatic.  No further intervention is necessary at this time.   # Hypertension:  Blood pressure is well-controlled.  Continue quinipril.  # Hyperlipidemia:  Mr. Jenniges has not yet started his pravastatin.  He states that he is now willing to try it.  He had myalgias with crestor.  Pravastatin 10 mg qMWF.  Stop fenofibrate.  We will repeat lipids in 3 months.    # Mild aortic stenosis: Asymptomatic.  Repeat echo in 1-2 years.   Current medicines are reviewed at length with the patient today.  The patient does not have concerns regarding medicines.  The following changes have been made:  Start pravastatin 10 mg qMWF.  Labs/ tests ordered today include:   No orders of the defined types were placed in this encounter.     Disposition:   FU with Shiron Whetsel C. Oval Linsey, MD,  [facility] in 3 months   This note was written with the assistance of speech recognition software.  Please excuse any transcriptional errors.  Signed, Alecxis Baltzell C. Oval Linsey, MD, [facility]  11/06/2015 10:00 AM    [place]

## 2015-11-06 ENCOUNTER — Ambulatory Visit (INDEPENDENT_AMBULATORY_CARE_PROVIDER_SITE_OTHER): Payer: Medicare Other | Admitting: Cardiovascular Disease

## 2015-11-06 ENCOUNTER — Encounter: Payer: Self-pay | Admitting: Cardiovascular Disease

## 2015-11-06 VITALS — BP 140/80 | HR 88 | Ht 70.0 in | Wt 171.5 lb

## 2015-11-06 DIAGNOSIS — R001 Bradycardia, unspecified: Secondary | ICD-10-CM

## 2015-11-06 DIAGNOSIS — I10 Essential (primary) hypertension: Secondary | ICD-10-CM | POA: Diagnosis not present

## 2015-11-06 DIAGNOSIS — E785 Hyperlipidemia, unspecified: Secondary | ICD-10-CM

## 2015-11-06 MED ORDER — PRAVASTATIN SODIUM 10 MG PO TABS: ORAL_TABLET | ORAL | Status: DC

## 2015-11-06 NOTE — Patient Instructions (Addendum)
Medication Instructions:  START PRAVASTATIN 10 MG ON Monday, Wednesday, AND Friday ONLY  STOP FENOFIBRATE  Labwork: NONE  Testing/Procedures: NONE  Follow-Up: Your physician recommends that you schedule a follow-up appointment in: [place]  If you need a refill on your cardiac medications before your next appointment, please call your pharmacy.

## 2015-11-12 LAB — BASIC METABOLIC PANEL
BUN: 15 mg/dL (ref 4–21)
Creatinine: 1.3 mg/dL (ref 0.6–1.3)
GLUCOSE: 131 mg/dL
Potassium: 4.5 mmol/L (ref 3.4–5.3)
Sodium: 140 mmol/L (ref 137–147)

## 2015-11-15 ENCOUNTER — Other Ambulatory Visit: Payer: Self-pay

## 2015-11-15 ENCOUNTER — Encounter: Payer: Self-pay | Admitting: Family Medicine

## 2015-11-15 DIAGNOSIS — K581 Irritable bowel syndrome with constipation: Secondary | ICD-10-CM

## 2015-11-15 MED ORDER — DOCUSATE SODIUM 100 MG PO CAPS: 100.0000 mg | ORAL_CAPSULE | Freq: Two times a day (BID) | ORAL | Status: DC

## 2015-11-15 MED ORDER — DIPHENOXYLATE-ATROPINE 2.5-0.025 MG PO TABS: 1.0000 | ORAL_TABLET | Freq: Four times a day (QID) | ORAL | Status: DC | PRN

## 2015-11-16 ENCOUNTER — Encounter: Payer: Self-pay | Admitting: Family Medicine

## 2015-11-19 ENCOUNTER — Other Ambulatory Visit: Payer: Self-pay

## 2015-11-19 DIAGNOSIS — E1129 Type 2 diabetes mellitus with other diabetic kidney complication: Secondary | ICD-10-CM

## 2015-11-19 MED ORDER — SITAGLIPTIN PHOSPHATE 25 MG PO TABS: 25.0000 mg | ORAL_TABLET | Freq: Every day | ORAL | Status: DC

## 2015-12-07 ENCOUNTER — Other Ambulatory Visit: Payer: Self-pay | Admitting: Family Medicine

## 2015-12-21 ENCOUNTER — Ambulatory Visit: Payer: Medicare Other | Admitting: Cardiovascular Disease

## 2015-12-24 ENCOUNTER — Ambulatory Visit (INDEPENDENT_AMBULATORY_CARE_PROVIDER_SITE_OTHER): Payer: Medicare Other | Admitting: Family Medicine

## 2015-12-24 ENCOUNTER — Encounter: Payer: Self-pay | Admitting: Family Medicine

## 2015-12-24 VITALS — BP 140/77 | HR 79 | Temp 98.8°F | Resp 17 | Ht 70.0 in | Wt 172.5 lb

## 2015-12-24 DIAGNOSIS — I350 Nonrheumatic aortic (valve) stenosis: Secondary | ICD-10-CM | POA: Diagnosis not present

## 2015-12-24 DIAGNOSIS — R21 Rash and other nonspecific skin eruption: Secondary | ICD-10-CM | POA: Diagnosis not present

## 2015-12-24 DIAGNOSIS — E1122 Type 2 diabetes mellitus with diabetic chronic kidney disease: Secondary | ICD-10-CM | POA: Diagnosis not present

## 2015-12-24 DIAGNOSIS — E785 Hyperlipidemia, unspecified: Secondary | ICD-10-CM

## 2015-12-24 DIAGNOSIS — N183 Chronic kidney disease, stage 3 unspecified: Secondary | ICD-10-CM

## 2015-12-24 DIAGNOSIS — I10 Essential (primary) hypertension: Secondary | ICD-10-CM

## 2015-12-24 MED ORDER — NYSTATIN 100000 UNIT/GM EX CREA: 1.0000 "application " | TOPICAL_CREAM | Freq: Two times a day (BID) | CUTANEOUS | Status: DC

## 2015-12-24 NOTE — Assessment & Plan Note (Signed)
Try to follow DASH guidelines 

## 2015-12-24 NOTE — Assessment & Plan Note (Signed)
Limit eggs to 3 per week if able; limit saturated fats; continue statin; monitored by cardiologist

## 2015-12-24 NOTE — Assessment & Plan Note (Signed)
Monitored by Dr. Juleen China; avoid NSAIDs

## 2015-12-24 NOTE — Assessment & Plan Note (Signed)
Murmur is mild; follow with heart doctor

## 2015-12-24 NOTE — Assessment & Plan Note (Addendum)
Foot exam by MD today; pt instructed to check feet every night; sees eye doctor yearly; continue meds, no changes; daily aspirin; next A1c due on or after August 29th; on insulin

## 2015-12-24 NOTE — Progress Notes (Signed)
BP 140/77 mmHg  Pulse 79  Temp(Src) 98.8 F (37.1 C) (Oral)  Resp 17  Ht 5\' 10"  (1.778 m)  Wt 172 lb 8 oz (78.245 kg)  BMI 24.75 kg/m2  SpO2 97%   Subjective:    Patient ID: Brian [name], male    DOB: Mar 18, 1934, 81 y.o.   MRN: [id number]  HPI: Brian [name] is a 82 y.o. male  Chief Complaint  Patient presents with  . Follow-up    3 mo   He has diabetes; he checks his sugars every morning, 2-3 times a day; he brought in his readings; some readings are high because of sweets; this morning 140; highest was 176 in April, and he says "it's my fault"; lowest in April was 87; no dry mouth, no blurred vision; does not need any extra teaching he says; stays away from white bread; no problems with his feet  He has high cholesterol; eats eggs every morning; not much bacon, cut back on that; eats some fried foods, just about anything; on statin just three days a week; does eat some cereal and grits; cardiologist stopped lopid; on statin and no aches  HTN; wife checks his BP; about like here, 130s and 140 range  CKD stage 2-3, 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 Past Medical History  Diagnosis Date  . Type 2 diabetes mellitus ([place])   . Hypertension   . Hyperlipidemia   . Bladder stone   . History of bladder stone   . Nocturia   . Renal disorder   . Arthritis   . Aortic stenosis, mild 09/21/2015   Past Surgical History  Procedure Laterality Date  . Cystoscopy/retrograde/ureteroscopy/stone extraction with basket N/A 03/22/2013    Procedure: CYSTOSCOPY BLADDER STONE STONE EXTRACTION;  Surgeon: [name], MD;  Location: [facility];  Service: Urology;  Laterality: N/A;   CYSTOSCOPY    . Tonsillectomy     Family History  Problem Relation Age of Onset  . Congestive Heart Failure     Social History  Substance Use Topics  . Smoking status: Former Smoker -- 0.75 packs/day for 39 years    Types: Cigarettes    Quit date: 03/21/1993  . Smokeless tobacco: None  . Alcohol Use: 0.0 oz/week    0 Standard drinks or equivalent per week   Interim medical history since last visit reviewed; he ate some oysters and had to go to the hospital; that actually happened in November 2016; stayed in the hospital; heart rate was abnormal; saw heart doctor in [place], Dr. Oval Linsey; goes back to her in 3 months; he sees kidney doctor across the street; he had labs done by Dr. Juleen China on November 12, 2015; Cr 1.31, GFR 59; albumin was back to normal at 4.2; was very low in December during hospitalization  Heart doctor put him on statin just 3 days a week  Allergies and medications reviewed and updated; ACE-I was in there twice  Review of Systems  Skin: Positive for rash (rash in groin, both sides; has had it before; asked for cream).   Per HPI unless specifically indicated above     Objective:    BP 140/77  mmHg  Pulse 79  Temp(Src) 98.8 F (37.1 C) (Oral)  Resp 17  Ht 5\' 10"  (1.778 m)  Wt 172 lb 8 oz (78.245 kg)  BMI 24.75 kg/m2  SpO2 97%  Wt Readings from Last 3 Encounters:  12/24/15 172 lb 8 oz (78.245 kg)  11/06/15 171 lb 8 oz (77.792 kg)  10/23/15 172 lb 4.8 oz (78.155 kg)    Physical Exam  Constitutional: He appears well-developed and well-nourished. No distress.  HENT:  Head: Normocephalic and atraumatic.  Eyes: EOM are normal. No scleral icterus.  Neck: No thyromegaly present.  Cardiovascular: Normal rate and regular rhythm.   Murmur heard.  Systolic murmur is present with a grade of 2/6  Pulmonary/Chest: Effort normal and breath sounds normal.  Abdominal: Soft. Bowel sounds are normal. He exhibits no distension.  Musculoskeletal: He exhibits no  edema.  Neurological: Coordination normal.  Skin: Skin is warm and dry. No pallor.  Psychiatric: He has a normal mood and affect. His behavior is normal. Judgment and thought content normal. His mood appears not anxious.    Diabetic Foot Form - Detailed   Diabetic Foot Exam - detailed  Diabetic Foot exam was performed with the following findings:  Yes 12/24/2015  8:19 AM  Visual Foot Exam completed.:  Yes  Are the toenails long?:  No  Are the toenails thick?:  No  Are the toenails ingrown?:  No  Normal Range of Motion:  Yes    Pulse Foot Exam completed.:  Yes  Right Dorsalis Pedis:  Present Left Dorsalis Pedis:  Present  Sensory Foot Exam Completed.:  Yes  Swelling:  No  Semmes-Weinstein Monofilament Test  R Site 1-Great Toe:  Pos L Site 1-Great Toe:  Pos  R Site 4:  Pos L Site 4:  Pos  R Site 5:  Pos L Site 5:  Pos        Results for orders placed or performed in visit on [id number]  Basic metabolic panel  Result Value Ref Range   Glucose 131 mg/dL   BUN 15 4 - 21 mg/dL   Creatinine 1.3 0.6 - 1.3 mg/dL   Potassium 4.5 3.4 - 5.3 mmol/L   Sodium 140 137 - 147 mmol/L      Assessment & Plan:   Problem List Items Addressed This Visit      Cardiovascular and Mediastinum   Hypertension goal BP (blood pressure) < 140/90 - Primary    Try to follow DASH guidelines      Aortic stenosis, mild    Murmur is mild; follow with heart doctor        Endocrine   Type 2 diabetes, controlled, with renal manifestation ([place])    Foot exam by MD today; pt instructed to check feet every night; sees eye doctor yearly; continue meds, no changes; daily aspirin; next A1c due on or after August 29th; on insulin        Genitourinary   Chronic kidney disease (CKD), stage III (moderate)    Monitored by Dr. Juleen China; avoid NSAIDs        Other   Hyperlipidemia LDL goal <100    Limit eggs to 3 per week if able; limit saturated fats; continue statin; monitored by cardiologist       Other  Visit Diagnoses    Rash of groin        topical nystatin cream prescribed       Follow up plan: Return in about 4 months (around 04/22/2016), or or  just after, for visit with Dr. Sanda Klein and fasting labs.  An after-visit summary was printed and given to the patient at [place].  Please see the patient instructions which may contain other information and recommendations beyond what is mentioned above in the assessment and plan.  Meds ordered this encounter  Medications  . nystatin-triamcinolone (MYCOLOG II) cream    Sig:   . DISCONTD: fenofibrate 160 MG tablet    Sig:   . nystatin cream (MYCOSTATIN)    Sig: Apply 1 application topically 2 (two) times daily. To rash in groin    Dispense:  30 g    Refill:  1    No orders of the defined types were placed in this encounter.

## 2015-12-24 NOTE — Patient Instructions (Addendum)
Please return on or just after August 29th Please do see your eye doctor regularly, and have your eyes examined every year (or more often per his or her recommendation) Check your feet every night and let me know right away of any sores, infections, numbness, etc. Try to limit sweets, white bread, white rice, white potatoes Try to limit saturated fats in your diet (bologna, hot dogs, barbeque, cheeseburgers, hamburgers, steak, bacon, sausage, cheese, etc.) and get more fresh fruits, vegetables, and whole grains Your goal blood pressure is less than 140 mmHg on top. Try to follow the DASH guidelines (DASH stands for Dietary Approaches to Stop Hypertension) Try to limit the sodium in your diet.  Ideally, consume less than 1.5 grams (less than 1,500mg ) per day. Do not add salt when cooking or at the table.  Check the sodium amount on labels when shopping, and choose items lower in sodium when given a choice. Avoid or limit foods that already contain a lot of sodium. Eat a diet rich in fruits and vegetables and whole grains.

## 2016-01-02 ENCOUNTER — Other Ambulatory Visit: Payer: Self-pay | Admitting: Family Medicine

## 2016-01-14 ENCOUNTER — Other Ambulatory Visit: Payer: Self-pay | Admitting: Family Medicine

## 2016-01-14 NOTE — Telephone Encounter (Signed)
Patient requesting refill. 

## 2016-01-18 ENCOUNTER — Encounter: Payer: Self-pay | Admitting: Family Medicine

## 2016-01-22 ENCOUNTER — Other Ambulatory Visit: Payer: Self-pay

## 2016-01-23 MED ORDER — GLIMEPIRIDE 2 MG PO TABS: 2.0000 mg | ORAL_TABLET | Freq: Two times a day (BID) | ORAL | Status: DC

## 2016-01-23 NOTE — Telephone Encounter (Signed)
Tolerating well; last A1c 6.9; approved

## 2016-01-24 ENCOUNTER — Other Ambulatory Visit: Payer: Self-pay | Admitting: Family Medicine

## 2016-02-07 ENCOUNTER — Ambulatory Visit (INDEPENDENT_AMBULATORY_CARE_PROVIDER_SITE_OTHER): Payer: Medicare Other | Admitting: Cardiovascular Disease

## 2016-02-07 ENCOUNTER — Encounter: Payer: Self-pay | Admitting: Cardiovascular Disease

## 2016-02-07 VITALS — BP 180/84 | HR 81 | Ht 71.0 in | Wt 172.4 lb

## 2016-02-07 DIAGNOSIS — I10 Essential (primary) hypertension: Secondary | ICD-10-CM

## 2016-02-07 DIAGNOSIS — I350 Nonrheumatic aortic (valve) stenosis: Secondary | ICD-10-CM

## 2016-02-07 DIAGNOSIS — E785 Hyperlipidemia, unspecified: Secondary | ICD-10-CM

## 2016-02-07 MED ORDER — AMLODIPINE BESYLATE 2.5 MG PO TABS: 2.5000 mg | ORAL_TABLET | Freq: Every day | ORAL | Status: DC

## 2016-02-07 MED ORDER — AMLODIPINE BESYLATE 5 MG PO TABS: 5.0000 mg | ORAL_TABLET | Freq: Every day | ORAL | Status: DC

## 2016-02-07 NOTE — Patient Instructions (Addendum)
Medication Instructions:   START AMLODIPINE 2.5 MG ONCE DAILY  Labwork:  Your physician recommends that you return for lab work 2 WEEKS= DO NOT EAT PRIOR TO LAB WORK  Follow-Up:  Your physician recommends that you schedule a follow-up appointment in: 2 [place] MD FOR BP CHECK

## 2016-02-07 NOTE — Progress Notes (Signed)
Cardiology Office Note   Date:  02/07/2016   ID:  Brian [name], DOB 1933-11-28, MRN [id number]  PCP:  Enid Derry, MD  Cardiologist:   Skeet Latch, MD   Chief Complaint  Patient presents with  . Follow-up    3 months  pt states no Sx.    Patient ID: Brian [name] is a 82 y.o. male with hypertension, diabetes mellitus, CKD 3, and hyperlipidemia who presents for follow up.  History of Present Illness: Brian [name] was admitted to the hospital 06/2015 with liver abscesses and sepsis. During that hospitalization he had demand ischemia with a peak troponin of 1.08.  EKG was negative for ischemia and his echo revealed normal systolic function, grade 1 diastolic dysfunction and mild aortic stenosis.  He followed up with cardiology following that hospitalization and was doing well.  He underwent exercise Cardiolite on 09/2015 that was negative for ischemia. He has been intolerant to statins in the past and was tried on pravastatin. At a follow-up appointment with his primary care physician who is noted to be bradycardic to 45 bpm. Carvedilol was held and he remained asymptomatic.  Since his last appointment Brian [name] has been feeling well. He denies any chest pain or shortness of breath. He also has not noted any lightheadedness, dizziness, or palpitations. He denies lower extremity edema, orthopnea or PND.  He did start taking pravastatin Monday, Wednesday and Friday. He denies any myalgias with this regimen. He notes that his wife was recently in the hospital with a new diagnosis of congestive heart failure. She will be coming for evaluation next week.   Past Medical History  Diagnosis Date  . Type 2 diabetes mellitus ([place])   . Hypertension   . Hyperlipidemia   . Bladder stone   . History of bladder stone   . Nocturia   . Renal disorder   . Arthritis   . Aortic stenosis, mild 09/21/2015    Past Surgical History  Procedure Laterality Date  .  Cystoscopy/retrograde/ureteroscopy/stone extraction with basket N/A 03/22/2013    Procedure: CYSTOSCOPY BLADDER STONE STONE EXTRACTION;  Surgeon: [name], MD;  Location: [facility];  Service: Urology;  Laterality: N/A;  CYSTOSCOPY    . Tonsillectomy       Current Outpatient Prescriptions  Medication Sig Dispense Refill  . aspirin EC 81 MG tablet Take 81 mg by mouth daily. Reported on 09/17/2015    . B-D UF III MINI PEN NEEDLES 31G X 5 MM MISC USE AS DIRECTED WITH LEVEMIR INSULIN PENS AT BEDTIME 100 each 2  . Blood Glucose Monitoring Suppl (ONE TOUCH ULTRA 2) W/DEVICE KIT     . diphenoxylate-atropine (LOMOTIL) 2.5-0.025 MG tablet Take 1 tablet by mouth 4 (four) times daily as needed for diarrhea or loose stools. 30 tablet 0  . docusate sodium (COLACE) 100 MG capsule Take 1 capsule (100 mg total) by mouth 2 (two) times daily. 60 capsule 5  . finasteride (PROSCAR) 5 MG tablet TAKE ONE (1) TABLET BY MOUTH EVERY DAY 90 tablet 0  . glimepiride (AMARYL) 2 MG tablet Take 1 tablet (2 mg total) by mouth 2 (two) times daily. 60 tablet 5  . Insulin Detemir (LEVEMIR) 100 UNIT/ML Pen Inject 6 Units into the skin daily. (Patient taking differently: Inject 4 Units into the skin daily at 10 pm. ) 3 pen 3  . nystatin cream (MYCOSTATIN) Apply 1 application topically 2 (two) times daily. To rash in groin 30 g 1  .  nystatin-triamcinolone (MYCOLOG II) cream APPLY TO AFFECTED AREAS 3 TIMES DAILY 30 g 1  . ONE TOUCH ULTRA TEST test strip USE AS DIRECTED 250 each 3  . pravastatin (PRAVACHOL) 10 MG tablet 1 TABLET BY MOUTH ON Monday, Wednesday, AND Friday ONLY 15 tablet 5  . quinapril (ACCUPRIL) 20 MG tablet TAKE ONE (1) TABLET BY MOUTH TWO (2) TIMES DAILY 180 tablet 1  . sitaGLIPtin (JANUVIA) 25 MG tablet Take 1 tablet (25 mg total) by mouth daily. 30 tablet 2  . tamsulosin (FLOMAX) 0.4 MG CAPS capsule TAKE ONE CAPSULE BY MOUTH DAILY 90 capsule 1  . amLODipine (NORVASC) 2.5 MG tablet Take 1  tablet (2.5 mg total) by mouth daily. 30 tablet 6   No current facility-administered medications for this visit.    Allergies:   Carvedilol; Niaspan; Shellfish allergy; Statins; Sulfa antibiotics; and Zetia    Social History:  The patient  reports that he quit smoking about 22 years ago. His smoking use included Cigarettes. He has a 29.25 pack-year smoking history. He does not have any smokeless tobacco history on file. He reports that he drinks alcohol. He reports that he does not use illicit drugs.   Family History:  The patient's family history is not on file.    ROS:  Please see the history of present illness.   Otherwise, review of systems are positive for weight loss.   All other systems are reviewed and negative.    PHYSICAL EXAM: VS:  BP 180/84 mmHg  Pulse 81  Ht '5\' 11"'$  (1.803 m)  Wt 172 lb 6.4 oz (78.2 kg)  BMI 24.06 kg/m2 , BMI Body mass index is 24.06 kg/(m^2). GENERAL:  Well appearing HEENT:  Pupils equal round and reactive, fundi not visualized, oral mucosa unremarkable NECK:  No jugular venous distention, waveform within normal limits, carotid upstroke brisk and symmetric, no bruits LYMPHATICS:  No cervical adenopathy LUNGS:  Clear to auscultation bilaterally HEART:  RRR.  PMI not displaced or sustained,S1 and S2 within normal limits, no S3, no S4, no clicks, no rubs, III/VI early-peaking systolic murmur at the RUSB. ABD:  Flat, positive bowel sounds normal in frequency in pitch, no bruits, no rebound, no guarding, no midline pulsatile mass, no hepatomegaly, no splenomegaly EXT:  2 plus pulses throughout, no edema, no cyanosis no clubbing SKIN:  No rashes no nodules NEURO:  Cranial nerves II through XII grossly intact, motor grossly intact throughout PSYCH:  Cognitively intact, oriented to person place and time  EKG:  EKG is not ordered today.  Exercise Cardiolite 09/28/15:  Nuclear stress EF: 51%.  The left ventricular ejection fraction is mildly decreased  (45-54%).  There was no ST segment deviation noted during stress.  The study is normal.  Normal stress nuclear study with no ischemia or infarction; low normal LV function with EF 51 and normal wall motion.  Recent Labs: 07/18/2015: TSH 0.679 07/31/2015: ALT 22 08/04/2015: Hemoglobin 8.7*; Platelets 335 11/12/2015: BUN 15; Creatinine 1.3; Potassium 4.5; Sodium 140    Lipid Panel No results found for: CHOL, TRIG, HDL, CHOLHDL, VLDL, LDLCALC, LDLDIRECT    Wt Readings from Last 3 Encounters:  02/07/16 172 lb 6.4 oz (78.2 kg)  12/24/15 172 lb 8 oz (78.245 kg)  11/06/15 171 lb 8 oz (77.792 kg)      ASSESSMENT AND PLAN:  # Bradycardia: Resolved after stopping carvedilol  # Hypertension:  Blood pressure is elevated today.  Repeat blood pressure was 160/82. He has renal dysfunction with a baseline  creatinine around 1.3.  Therefore we will not increase his quinapril.  We will add amlodipine to his wife notes that he had dizziness when on 10 mg in the past. 2.5 mg daily.    # Hyperlipidemia:  Mr. Weygandt started taking pravastatin Monday, Wednesday, and Friday. He is doing well with this regimen. He will come back in 2 weeks for a blood pressure check and will have a comprehensive metabolic panel and lipids drawn at that time.   # Mild aortic stenosis: Asymptomatic.  Repeat echo in 1-2 years.   Current medicines are reviewed at length with the patient today.  The patient does not have concerns regarding medicines.  The following changes have been made:  Start amlodipine 2.5 mg daily.   Labs/ tests ordered today include:   Orders Placed This Encounter  Procedures  . Basic Metabolic Panel (BMET)  . Lipid panel     Disposition:   FU with Puneet Masoner C. Oval Linsey, MD, [facility] in 2 weeks.   This note was written with the assistance of speech recognition software.  Please excuse any transcriptional errors.  Signed, Suren Payne C. Oval Linsey, MD, [facility]  02/07/2016 9:54 AM    [facility]

## 2016-02-11 ENCOUNTER — Other Ambulatory Visit: Payer: Self-pay | Admitting: Family Medicine

## 2016-02-11 MED ORDER — QUINAPRIL HCL 20 MG PO TABS: 20.0000 mg | ORAL_TABLET | Freq: Two times a day (BID) | ORAL | Status: DC

## 2016-02-11 NOTE — Telephone Encounter (Signed)
Patient requesting refill. 

## 2016-02-11 NOTE — Telephone Encounter (Signed)
Last labs from March reviewed, Cr 1.33; Rxs approved; I don't know why he's on so low a dose of Januvia

## 2016-02-19 ENCOUNTER — Ambulatory Visit (INDEPENDENT_AMBULATORY_CARE_PROVIDER_SITE_OTHER): Payer: Medicare Other | Admitting: Pharmacist Clinician (PhC)/ Clinical Pharmacy Specialist

## 2016-02-19 ENCOUNTER — Encounter: Payer: Self-pay | Admitting: Pharmacist Clinician (PhC)/ Clinical Pharmacy Specialist

## 2016-02-19 VITALS — BP 152/82 | HR 84 | Ht 71.0 in | Wt 174.0 lb

## 2016-02-19 DIAGNOSIS — I10 Essential (primary) hypertension: Secondary | ICD-10-CM | POA: Diagnosis not present

## 2016-02-19 MED ORDER — AMLODIPINE BESYLATE 5 MG PO TABS: 5.0000 mg | ORAL_TABLET | Freq: Every day | ORAL | Status: DC

## 2016-02-19 NOTE — Patient Instructions (Signed)
  Your blood pressure today is 152/82  (goal is to be < 140/90)  Check your blood pressure at home daily and keep record of the readings.  Take your BP meds as follows: increase amlodipine to 5 mg daily (take 2 of the 2.5 mg tablets until they are gone).  Continue with quinapril twice daily  Bring all of your meds, your BP cuff and your record of home blood pressures to your next appointment.  Exercise as you're able, try to walk approximately 30 minutes per day.  Keep salt intake to a minimum, especially watch canned and prepared boxed foods.  Eat more fresh fruits and vegetables and fewer canned items.  Avoid eating in fast food restaurants.    HOW TO TAKE YOUR BLOOD PRESSURE: . Rest 5 minutes before taking your blood pressure. .  Don't smoke or drink caffeinated beverages for at least 30 minutes before. . Take your blood pressure before (not after) you eat. . Sit comfortably with your back supported and both feet on the floor (don't cross your legs). . Elevate your arm to heart level on a table or a desk. . Use the proper sized cuff. It should fit smoothly and snugly around your bare upper arm. There should be enough room to slip a fingertip under the cuff. The bottom edge of the cuff should be 1 inch above the crease of the elbow. . Ideally, take 3 measurements at one sitting and record the average.

## 2016-02-19 NOTE — Progress Notes (Signed)
   02/19/2016 Brian [name] 01/14/1934 [id number]   HPI:  Brian [name] is a 82 y.o. male patient of Dr [facility] [place], with a PMH below who presents today for hypertension clinic evaluation.  Previously he had been on amlodipine 10 mg daily in addition to quinapril 20 mg bid.  The amlodipine was stopped during a hospital admit last November.  Dr. [place] restarted him on 2.5 mg earlier this month.    Blood Pressure Goal:  140/90  (DM)  Current Medications: amlodipine 2.5 mg qd, quinapril 20 mg bid  Cardiac Hx: grade 1 diastolic dysfunction, hyperlipidemia, DM  Family Hx: nothing significant  Social Hx: no tobacco (quit 20+ years ago); no alcohol; rare caffeine  Diet: has recently cut back on pork (bacon, sausage) and eggs; does not add salt at table, but admits to eating out several times per week  Exercise: stays active with yard work  Home BP readings:  Average 137/84, high 165/102, low 120/76.  Wife has been taking with cuff and stethescope  Intolerances: wife states amlodipine 10 mg made him dizzy    Wt Readings from Last 3 Encounters:  02/19/16 174 lb (78.926 kg)  02/07/16 172 lb 6.4 oz (78.2 kg)  12/24/15 172 lb 8 oz (78.245 kg)   BP Readings from Last 3 Encounters:  02/19/16 152/82  02/07/16 180/84  12/24/15 140/77   Pulse Readings from Last 3 Encounters:  02/19/16 84  02/07/16 81  12/24/15 79    Current Outpatient Prescriptions  Medication Sig Dispense Refill  . amLODipine (NORVASC) 5 MG tablet Take 1 tablet (5 mg total) by mouth daily. 30 tablet 6  . aspirin EC 81 MG tablet Take 81 mg by mouth daily. Reported on 09/17/2015    . B-D UF III MINI PEN NEEDLES 31G X 5 MM MISC USE AS DIRECTED WITH LEVEMIR INSULIN PENS AT BEDTIME 100 each 2  . Blood Glucose Monitoring Suppl (ONE TOUCH ULTRA 2) W/DEVICE KIT     . diphenoxylate-atropine (LOMOTIL) 2.5-0.025 MG tablet Take 1 tablet by mouth 4 (four) times daily as needed for diarrhea or loose stools. 30 tablet 0  .  docusate sodium (COLACE) 100 MG capsule Take 1 capsule (100 mg total) by mouth 2 (two) times daily. 60 capsule 5  . finasteride (PROSCAR) 5 MG tablet TAKE ONE (1) TABLET BY MOUTH EVERY DAY 90 tablet 0  . glimepiride (AMARYL) 2 MG tablet Take 1 tablet (2 mg total) by mouth 2 (two) times daily. 60 tablet 5  . Insulin Detemir (LEVEMIR) 100 UNIT/ML Pen Inject 6 Units into the skin daily. (Patient taking differently: Inject 4 Units into the skin daily at 10 pm. ) 3 pen 3  . JANUVIA 25 MG tablet TAKE ONE (1) TABLET BY MOUTH EVERY DAY 30 tablet 2  . nystatin cream (MYCOSTATIN) Apply 1 application topically 2 (two) times daily. To rash in groin 30 g 1  . nystatin-triamcinolone (MYCOLOG II) cream APPLY TO AFFECTED AREAS 3 TIMES DAILY 30 g 1  . ONE TOUCH ULTRA TEST test strip USE AS DIRECTED 250 each 3  . pravastatin (PRAVACHOL) 10 MG tablet 1 TABLET BY MOUTH ON Monday, Wednesday, AND Friday ONLY 15 tablet 5  . quinapril (ACCUPRIL) 20 MG tablet Take 1 tablet (20 mg total) by mouth 2 (two) times daily. 60 tablet 2  . tamsulosin (FLOMAX) 0.4 MG CAPS capsule TAKE ONE CAPSULE BY MOUTH DAILY 90 capsule 1   No current facility-administered medications for this visit.      Allergies  Allergen Reactions  . Carvedilol Other (See Comments)    bradycardia  . Niaspan [Niacin Er]   . Shellfish Allergy Other (See Comments)    Pt vomits  . Statins Other (See Comments)    Myalgia  . Sulfa Antibiotics   . Zetia [Ezetimibe]     Past Medical History  Diagnosis Date  . Type 2 diabetes mellitus ([facility])   . Hypertension   . Hyperlipidemia   . Bladder stone   . History of bladder stone   . Nocturia   . Renal disorder   . Arthritis   . Aortic stenosis, mild 09/21/2015    Blood pressure 152/82, pulse 84, height 5' 11" (1.803 m), weight 174 lb (78.926 kg).    Kristin Alvstad PharmD CPP [facility]   

## 2016-02-19 NOTE — Assessment & Plan Note (Signed)
Today his BP in the office remains elevated at 152/82.  His home readings are lower, with an average of 137/84.  He has not problems today, so will go ahead and increase the amlodipine to 5 mg daily.  He will continue with daily home BP checks and return for follow up in 1 month.

## 2016-02-22 ENCOUNTER — Other Ambulatory Visit: Payer: Self-pay | Admitting: Family Medicine

## 2016-02-22 NOTE — Telephone Encounter (Signed)
Patient requesting refill. 

## 2016-03-18 ENCOUNTER — Ambulatory Visit (INDEPENDENT_AMBULATORY_CARE_PROVIDER_SITE_OTHER): Payer: Medicare Other | Admitting: Pharmacist Clinician (PhC)/ Clinical Pharmacy Specialist

## 2016-03-18 VITALS — BP 144/78 | HR 80 | Ht 71.0 in | Wt 174.4 lb

## 2016-03-18 DIAGNOSIS — I10 Essential (primary) hypertension: Secondary | ICD-10-CM

## 2016-03-18 NOTE — Assessment & Plan Note (Signed)
Today he returns with a list of home blood pressures, none of which is greater than 140/90.  He is feeling well and has no concerns or complaints today.  His home BP average was 126/81.  He is to continue with all of his current medications and knows to call should he see his BP trend to > 140/90 on multiple occasions.

## 2016-03-18 NOTE — Progress Notes (Signed)
03/18/2016 Brian [name] 30-Jan-1934 [id number]   HPI:  Brian [name] is a 82 y.o. male patient of Dr Oval Linsey, with a PMH below who presents today for hypertension clinic follow up.  He had been managed on amlodipine 10 mg daily and quinapril 20 mg twice daily until a hospitalization last November.  At that time the amlodipine was discontinued.  At an appointment with Candace Cruise in June it was restarted at 2.5 mg then increased by me to 5 mg late in June.  He returns today stating no concerns or changes in any of his medications.  Blood Pressure Goal:  140/90  (DM)  Current Medications: amlodipine 5 mg qd, quinapril 20 mg bid  Cardiac Hx: grade 1 diastolic dysfunction, hyperlipidemia, DM  Family Hx: nothing significant  Social Hx: no tobacco (quit 20+ years ago); no alcohol; rare caffeine  Diet: has recently cut back on pork (bacon, sausage) and eggs; does not add salt at table, but admits to eating out several times per week  Exercise: stays active with yard work  Home BP readings:  Average 126/81, high 138/83, low 108/73.  Wife has been taking with cuff and stethescope  Intolerances: wife states amlodipine 10 mg made him dizzy    Wt Readings from Last 3 Encounters:  03/18/16 174 lb 6.4 oz (79.1 kg)  02/19/16 174 lb (78.9 kg)  02/07/16 172 lb 6.4 oz (78.2 kg)   BP Readings from Last 3 Encounters:  03/18/16 (!) 144/78  02/19/16 (!) 152/82  02/07/16 (!) 180/84   Pulse Readings from Last 3 Encounters:  03/18/16 80  02/19/16 84  02/07/16 81    Current Outpatient Prescriptions  Medication Sig Dispense Refill  . amLODipine (NORVASC) 5 MG tablet Take 1 tablet (5 mg total) by mouth daily. 30 tablet 6  . aspirin EC 81 MG tablet Take 81 mg by mouth daily. Reported on 09/17/2015    . B-D UF III MINI PEN NEEDLES 31G X 5 MM MISC USE AS DIRECTED WITH LEVEMIR INSULIN PENS AT BEDTIME 100 each 2  . Blood Glucose Monitoring Suppl (ONE TOUCH ULTRA 2) W/DEVICE KIT     .  diphenoxylate-atropine (LOMOTIL) 2.5-0.025 MG tablet Take 1 tablet by mouth 4 (four) times daily as needed for diarrhea or loose stools. 30 tablet 0  . docusate sodium (COLACE) 100 MG capsule Take 1 capsule (100 mg total) by mouth 2 (two) times daily. 60 capsule 5  . finasteride (PROSCAR) 5 MG tablet TAKE ONE (1) TABLET BY MOUTH EVERY DAY 30 tablet 5  . glimepiride (AMARYL) 2 MG tablet Take 1 tablet (2 mg total) by mouth 2 (two) times daily. 60 tablet 5  . Insulin Detemir (LEVEMIR) 100 UNIT/ML Pen Inject 6 Units into the skin daily. (Patient taking differently: Inject 4 Units into the skin daily at 10 pm. ) 3 pen 3  . JANUVIA 25 MG tablet TAKE ONE (1) TABLET BY MOUTH EVERY DAY 30 tablet 2  . nystatin cream (MYCOSTATIN) Apply 1 application topically 2 (two) times daily. To rash in groin 30 g 1  . nystatin-triamcinolone (MYCOLOG II) cream APPLY TO AFFECTED AREAS 3 TIMES DAILY 30 g 1  . ONE TOUCH ULTRA TEST test strip USE AS DIRECTED 250 each 3  . pravastatin (PRAVACHOL) 10 MG tablet 1 TABLET BY MOUTH ON Monday, Wednesday, AND Friday ONLY 15 tablet 5  . quinapril (ACCUPRIL) 20 MG tablet Take 1 tablet (20 mg total) by mouth 2 (two) times daily. Poth  tablet 2  . tamsulosin (FLOMAX) 0.4 MG CAPS capsule TAKE ONE CAPSULE BY MOUTH DAILY 90 capsule 1   No current facility-administered medications for this visit.     Allergies  Allergen Reactions  . Carvedilol Other (See Comments)    bradycardia  . Niaspan [Niacin Er]   . Shellfish Allergy Other (See Comments)    Pt vomits  . Statins Other (See Comments)    Myalgia  . Sulfa Antibiotics   . Zetia [Ezetimibe]     Past Medical History:  Diagnosis Date  . Aortic stenosis, mild 09/21/2015  . Arthritis   . Bladder stone   . History of bladder stone   . Hyperlipidemia   . Hypertension   . Nocturia   . Renal disorder   . Type 2 diabetes mellitus (HCC)     Blood pressure (!) 144/78, pulse 80, height '5\' 11"'$  (1.803 m), weight 174 lb 6.4 oz (79.1  kg).    Tommy Medal PharmD CPP [facility]

## 2016-03-18 NOTE — Patient Instructions (Signed)
  Your blood pressure today is 144/78 (goal is < 140/90)   Check your blood pressure at home several times each week and keep record of the readings.  Take your BP meds as follows: continue with amlodipine 5 mg daily and quinapril 20 mg twice daily  Bring all of your meds, your BP cuff and your record of home blood pressures to your next appointment.  Exercise as you're able, try to walk approximately 30 minutes per day.  Keep salt intake to a minimum, especially watch canned and prepared boxed foods.  Eat more fresh fruits and vegetables and fewer canned items.  Avoid eating in fast food restaurants.    HOW TO TAKE YOUR BLOOD PRESSURE: . Rest 5 minutes before taking your blood pressure. .  Don't smoke or drink caffeinated beverages for at least 30 minutes before. . Take your blood pressure before (not after) you eat. . Sit comfortably with your back supported and both feet on the floor (don't cross your legs). . Elevate your arm to heart level on a table or a desk. . Use the proper sized cuff. It should fit smoothly and snugly around your bare upper arm. There should be enough room to slip a fingertip under the cuff. The bottom edge of the cuff should be 1 inch above the crease of the elbow. . Ideally, take 3 measurements at one sitting and record the average.

## 2016-04-04 ENCOUNTER — Other Ambulatory Visit: Payer: Self-pay | Admitting: Family Medicine

## 2016-04-14 ENCOUNTER — Other Ambulatory Visit: Payer: Self-pay | Admitting: Family Medicine

## 2016-04-14 DIAGNOSIS — K581 Irritable bowel syndrome with constipation: Secondary | ICD-10-CM

## 2016-04-14 NOTE — Telephone Encounter (Signed)
Patient requesting refill. 

## 2016-05-05 ENCOUNTER — Ambulatory Visit (INDEPENDENT_AMBULATORY_CARE_PROVIDER_SITE_OTHER): Payer: Medicare Other | Admitting: Family Medicine

## 2016-05-05 ENCOUNTER — Encounter: Payer: Self-pay | Admitting: Family Medicine

## 2016-05-05 VITALS — BP 142/78 | HR 94 | Temp 98.0°F | Wt 175.0 lb

## 2016-05-05 DIAGNOSIS — N183 Chronic kidney disease, stage 3 unspecified: Secondary | ICD-10-CM

## 2016-05-05 DIAGNOSIS — I10 Essential (primary) hypertension: Secondary | ICD-10-CM | POA: Diagnosis not present

## 2016-05-05 DIAGNOSIS — Z5181 Encounter for therapeutic drug level monitoring: Secondary | ICD-10-CM | POA: Diagnosis not present

## 2016-05-05 DIAGNOSIS — E785 Hyperlipidemia, unspecified: Secondary | ICD-10-CM

## 2016-05-05 DIAGNOSIS — E1122 Type 2 diabetes mellitus with diabetic chronic kidney disease: Secondary | ICD-10-CM

## 2016-05-05 DIAGNOSIS — Z23 Encounter for immunization: Secondary | ICD-10-CM

## 2016-05-05 DIAGNOSIS — I350 Nonrheumatic aortic (valve) stenosis: Secondary | ICD-10-CM

## 2016-05-05 DIAGNOSIS — N401 Enlarged prostate with lower urinary tract symptoms: Secondary | ICD-10-CM | POA: Diagnosis not present

## 2016-05-05 DIAGNOSIS — D631 Anemia in chronic kidney disease: Secondary | ICD-10-CM | POA: Diagnosis not present

## 2016-05-05 LAB — COMPLETE METABOLIC PANEL WITH GFR
ALBUMIN: 4.1 g/dL (ref 3.6–5.1)
ALK PHOS: 42 U/L (ref 40–115)
ALT: 13 U/L (ref 9–46)
AST: 19 U/L (ref 10–35)
BUN: 19 mg/dL (ref 7–25)
CALCIUM: 9.6 mg/dL (ref 8.6–10.3)
CO2: 26 mmol/L (ref 20–31)
Chloride: 105 mmol/L (ref 98–110)
Creat: 1.27 mg/dL — ABNORMAL HIGH (ref 0.70–1.11)
GFR, EST AFRICAN AMERICAN: 61 mL/min (ref >=60)
GFR, Est Non African American: 53 mL/min — ABNORMAL LOW (ref >=60)
Glucose, Bld: 166 mg/dL — ABNORMAL HIGH (ref 65–99)
POTASSIUM: 4.2 mmol/L (ref 3.5–5.3)
Sodium: 140 mmol/L (ref 135–146)
Total Bilirubin: 0.4 mg/dL (ref 0.2–1.2)
Total Protein: 7 g/dL (ref 6.1–8.1)

## 2016-05-05 LAB — LIPID PANEL
CHOL/HDL RATIO: 3.1 ratio (ref <=5.0)
CHOLESTEROL: 217 mg/dL — AB (ref 125–200)
HDL: 69 mg/dL (ref >=40)
LDL Cholesterol: 130 mg/dL — ABNORMAL HIGH (ref <130)
TRIGLYCERIDES: 90 mg/dL (ref <150)
VLDL: 18 mg/dL (ref <30)

## 2016-05-05 LAB — HEMOGLOBIN A1C
HEMOGLOBIN A1C: 8.5 % — AB (ref <5.7)
MEAN PLASMA GLUCOSE: 197 mg/dL

## 2016-05-05 MED ORDER — AMLODIPINE BESYLATE 10 MG PO TABS: 10.0000 mg | ORAL_TABLET | Freq: Every day | ORAL | 5 refills | Status: DC

## 2016-05-05 NOTE — Assessment & Plan Note (Signed)
Check lipids today; try to limit saturated fats 

## 2016-05-05 NOTE — Progress Notes (Signed)
BP (!) 142/78   Pulse 94   Temp 98 F (36.7 C) (Oral)   Wt 175 lb (79.4 kg)   SpO2 98%   BMI 24.41 kg/m    Subjective:    Patient ID: Brian [name], male    DOB: 1934-03-26, 82 y.o.   MRN: [id number]  HPI: Brian [name] is a 82 y.o. male  Chief Complaint  Patient presents with  . Follow-up   Patient is here for f/u of several health issues He has type 2 diabetes He will make an appt with his eye doctor soon No problems with feet He brought in his sugars 97 to 163 in August, 127 to 167 in September Trying to stay away from white bread and sugary drinks; drinks diet caffeine free [place] Dew Patient wishes to check FSBS three times a day, but currently only documenting once a day on the paper he brought in  CKD stage 3; sees Dr. Juleen China; avoids NSAIDs  High cholesterol; on statin (pravatatin)  Has prostate enlargement, on two medicines; sees urologist in [place]  HTN; does not add salt to food; on quinipril 10 mg and amlodipine 5 mg daily; he was at his heart doctor's not long ago and they increased his CCB from 2.5 mg daily to 5 mg daily; BP still high here today  Depression screen [facility] 2/9 05/05/2016 12/24/2015 10/23/2015 09/17/2015 05/08/2015  Decreased Interest 0 0 0 0 0  Down, Depressed, Hopeless 0 0 0 0 0  PHQ - 2 Score 0 0 0 0 0   Relevant past medical, surgical, family and social history reviewed Past Medical History:  Diagnosis Date  . Aortic stenosis, mild 09/21/2015  . Arthritis   . Bladder stone   . History of bladder stone   . Hyperlipidemia   . Hypertension   . Nocturia   . Renal disorder   . Type 2 diabetes mellitus ([place])    Past Surgical History:  Procedure Laterality Date  . CYSTOSCOPY/RETROGRADE/URETEROSCOPY/STONE EXTRACTION WITH BASKET N/A 03/22/2013   Procedure: CYSTOSCOPY BLADDER STONE STONE EXTRACTION;  Surgeon: [name], MD;  Location: [facility];  Service: Urology;  Laterality: N/A;  CYSTOSCOPY    . TONSILLECTOMY      Family History  Problem Relation Age of Onset  . Congestive Heart Failure     Social History  Substance Use Topics  . Smoking status: Former Smoker    Packs/day: 0.75    Years: 39.00    Types: Cigarettes    Quit date: 03/21/1993  . Smokeless tobacco: Never Used  . Alcohol use 0.0 oz/week   Interim medical history since last visit reviewed. Allergies and medications reviewed  Review of Systems Per HPI unless specifically indicated above     Objective:    BP (!) 142/78   Pulse 94   Temp 98 F (36.7 C) (Oral)   Wt 175 lb (79.4 kg)   SpO2 98%   BMI 24.41 kg/m   Wt Readings from Last 3 Encounters:  05/05/16 175 lb (79.4 kg)  03/18/16 174 lb 6.4 oz (79.1 kg)  02/19/16 174 lb (78.9 kg)    Physical Exam  Constitutional: He appears well-developed and well-nourished. No distress.  HENT:  Head: Normocephalic and atraumatic.  Eyes: EOM are normal. No scleral icterus.  Neck: No thyromegaly present.  Cardiovascular: Normal rate and regular rhythm.   Murmur heard.  Systolic murmur is present with a grade of 2/6  Pulmonary/Chest: Effort normal and breath sounds normal.  Abdominal: Soft. Bowel sounds are normal. He exhibits no distension.  Musculoskeletal: He exhibits no edema.  Neurological: Coordination normal.  Skin: Skin is warm and dry. No pallor.  Psychiatric: He has a normal mood and affect. His behavior is normal. Judgment and thought content normal. His mood appears not anxious.   Diabetic Foot Form - Detailed   Diabetic Foot Exam - detailed Diabetic Foot exam was performed with the following findings:  Yes 05/05/2016  8:32 AM  Visual Foot Exam completed.:  Yes  Are the toenails long?:  No Are the toenails thick?:  No Are the toenails ingrown?:  No Normal Range of Motion:  Yes Pulse Foot Exam completed.:  Yes  Right Dorsalis Pedis:  Present Left Dorsalis Pedis:  Present  Sensory Foot Exam Completed.:  Yes Swelling:  No Semmes-Weinstein Monofilament Test R  Site 1-Great Toe:  Pos L Site 1-Great Toe:  Pos  R Site 4:  Pos L Site 4:  Pos  R Site 5:  Pos L Site 5:  Pos        Results for orders placed or performed in visit on [id number]  Basic metabolic panel  Result Value Ref Range   Glucose 131 mg/dL   BUN 15 4 - 21 mg/dL   Creatinine 1.3 0.6 - 1.3 mg/dL   Potassium 4.5 3.4 - 5.3 mmol/L   Sodium 140 137 - 147 mmol/L      Assessment & Plan:   Problem List Items Addressed This Visit      Cardiovascular and Mediastinum   Hypertension goal BP (blood pressure) < 140/90    Avoid salt, increase amlodipine from 5 to 10 mg daily; try DASH guidelines      Relevant Medications   amLODipine (NORVASC) 10 MG tablet   Aortic stenosis, mild    Continue to follow-up with cardiologist      Relevant Medications   amLODipine (NORVASC) 10 MG tablet     Endocrine   Type 2 diabetes, controlled, with renal manifestation (HCC)    Check A1c; foot exam by MD today; BP not to goal so increase CCB; continue ACE-I      Relevant Orders   Hemoglobin A1c     Genitourinary   Chronic kidney disease (CKD), stage III (moderate) - Primary    Followed by Dr. Juleen China; avoid NSAIDs      Benign localized prostatic hyperplasia with lower urinary tract symptoms (LUTS)    Follow-up with urologist      Anemia of chronic renal failure    Monitored by Dr. Juleen China        Other   Hyperlipidemia LDL goal <100    Check lipids today; try to limit saturated fats      Relevant Medications   amLODipine (NORVASC) 10 MG tablet   Other Relevant Orders   Lipid panel   Encounter for medication monitoring    Check labs today      Relevant Orders   COMPLETE METABOLIC PANEL WITH GFR    Other Visit Diagnoses    Needs flu shot       Relevant Orders   Flu vaccine HIGH DOSE PF (Fluzone High dose)      Follow up plan: No Follow-up on file.  An after-visit summary was printed and given to the patient at [facility].  Please see the patient instructions which may  contain other information and recommendations beyond what is mentioned above in the assessment and plan.  Meds ordered this encounter  Medications  .  DISCONTD: amLODipine (NORVASC) 10 MG tablet    Sig: Take 1 tablet (10 mg total) by mouth daily. For blood pressure    Dispense:  3 tablet    Refill:  5    Cancel any 5 mg refills; we're using 10 mg daily now  . amLODipine (NORVASC) 10 MG tablet    Sig: Take 1 tablet (10 mg total) by mouth daily. For blood pressure    Dispense:  30 tablet    Refill:  5    Cancel any 5 mg refills; we're using 10 mg daily now    Orders Placed This Encounter  Procedures  . Flu vaccine HIGH DOSE PF (Fluzone High dose)  . Hemoglobin A1c  . Lipid panel  . COMPLETE METABOLIC PANEL WITH GFR

## 2016-05-05 NOTE — Assessment & Plan Note (Signed)
Continue to follow up with cardiologist  

## 2016-05-05 NOTE — Assessment & Plan Note (Signed)
Avoid salt, increase amlodipine from 5 to 10 mg daily; try DASH guidelines

## 2016-05-05 NOTE — Assessment & Plan Note (Signed)
Check labs today.

## 2016-05-05 NOTE — Patient Instructions (Signed)
Your goal blood pressure is less than 140 mmHg on top. Try to follow the DASH guidelines (DASH stands for Dietary Approaches to Stop Hypertension) Try to limit the sodium in your diet.  Ideally, consume less than 1.5 grams (less than 1,500mg ) per day. Do not add salt when cooking or at the table.  Check the sodium amount on labels when shopping, and choose items lower in sodium when given a choice. Avoid or limit foods that already contain a lot of sodium. Eat a diet rich in fruits and vegetables and whole grains.  Try to limit saturated fats in your diet (bologna, hot dogs, barbeque, cheeseburgers, hamburgers, steak, bacon, sausage, cheese, etc.) and get more fresh fruits, vegetables, and whole grains  Please do see your eye doctor regularly, and have your eyes examined every year (or more often per his or her recommendation) Check your feet every night and let me know right away of any sores, infections, numbness, etc. Try to limit sweets, white bread, white rice, white potatoes  Increase your amlodipine to 10 mg daily If you choose to check your fingerstick blood sugars 3x a day, we just need you to document those and bring [place] a copy over the next month

## 2016-05-05 NOTE — Assessment & Plan Note (Signed)
Followed by Dr. Kolluru; avoid NSAIDs 

## 2016-05-05 NOTE — Assessment & Plan Note (Signed)
Follow-up with urologist

## 2016-05-05 NOTE — Assessment & Plan Note (Signed)
Check A1c; foot exam by MD today; BP not to goal so increase CCB; continue ACE-I

## 2016-05-05 NOTE — Assessment & Plan Note (Signed)
Monitored by Dr. Juleen China

## 2016-05-06 ENCOUNTER — Other Ambulatory Visit: Payer: Self-pay | Admitting: Family Medicine

## 2016-05-06 MED ORDER — ROSUVASTATIN CALCIUM 5 MG PO TABS: ORAL_TABLET | ORAL | 2 refills | Status: DC

## 2016-05-06 MED ORDER — INSULIN DETEMIR 100 UNIT/ML FLEXPEN: 12.0000 [IU] | PEN_INJECTOR | Freq: Every day | SUBCUTANEOUS | 3 refills | Status: DC

## 2016-05-06 MED ORDER — SITAGLIPTIN PHOSPHATE 50 MG PO TABS: 50.0000 mg | ORAL_TABLET | Freq: Every day | ORAL | 5 refills | Status: DC

## 2016-05-06 NOTE — Progress Notes (Signed)
Multiple changes to meds

## 2016-05-07 ENCOUNTER — Other Ambulatory Visit: Payer: Self-pay | Admitting: Family Medicine

## 2016-05-21 ENCOUNTER — Ambulatory Visit: Payer: Medicare Other

## 2016-05-21 VITALS — BP 130/78 | HR 82 | Resp 14

## 2016-05-21 DIAGNOSIS — I10 Essential (primary) hypertension: Secondary | ICD-10-CM

## 2016-05-29 ENCOUNTER — Telehealth: Payer: Self-pay

## 2016-05-29 NOTE — Telephone Encounter (Signed)
Patient called states is checking sugar in am and still running high 200's and is concerned?

## 2016-05-29 NOTE — Telephone Encounter (Signed)
Please thank patient for monitoring and contacting us; instruct patient to increase insulin from twelve units once a day to fifteen units once a day; monitor sugars and if still over 150 fasting by Tuesday morning, please call us back for further dose adjustment

## 2016-05-30 NOTE — Telephone Encounter (Signed)
Patient notified

## 2016-06-03 ENCOUNTER — Other Ambulatory Visit: Payer: Self-pay | Admitting: Family Medicine

## 2016-06-04 NOTE — Telephone Encounter (Signed)
rx approved

## 2016-07-18 ENCOUNTER — Other Ambulatory Visit: Payer: Self-pay | Admitting: Family Medicine

## 2016-07-18 NOTE — Telephone Encounter (Signed)
Patient is due for an appt and fasting labs on or just after December 12th We made medicine changes He should not be out of his statin yet if taking properly, but I'll send one more month of that

## 2016-07-21 ENCOUNTER — Other Ambulatory Visit: Payer: Self-pay | Admitting: Family Medicine

## 2016-07-21 NOTE — Telephone Encounter (Signed)
I just approved 13+0 on 07/18/16; note sent to pharmacy

## 2016-07-21 NOTE — Telephone Encounter (Signed)
Pt scheduled  

## 2016-07-22 ENCOUNTER — Other Ambulatory Visit: Payer: Self-pay | Admitting: Family Medicine

## 2016-07-23 NOTE — Telephone Encounter (Signed)
Reviewed last creatinine and K+; rx approved

## 2016-08-07 ENCOUNTER — Ambulatory Visit: Payer: Medicare Other | Admitting: Family Medicine

## 2016-08-11 ENCOUNTER — Other Ambulatory Visit: Payer: Self-pay | Admitting: Family Medicine

## 2016-08-12 ENCOUNTER — Encounter: Payer: Self-pay | Admitting: Family Medicine

## 2016-08-12 ENCOUNTER — Ambulatory Visit (INDEPENDENT_AMBULATORY_CARE_PROVIDER_SITE_OTHER): Payer: Medicare Other | Admitting: Family Medicine

## 2016-08-12 DIAGNOSIS — Z5181 Encounter for therapeutic drug level monitoring: Secondary | ICD-10-CM | POA: Diagnosis not present

## 2016-08-12 DIAGNOSIS — E785 Hyperlipidemia, unspecified: Secondary | ICD-10-CM | POA: Diagnosis not present

## 2016-08-12 DIAGNOSIS — E1122 Type 2 diabetes mellitus with diabetic chronic kidney disease: Secondary | ICD-10-CM

## 2016-08-12 DIAGNOSIS — N183 Chronic kidney disease, stage 3 unspecified: Secondary | ICD-10-CM

## 2016-08-12 DIAGNOSIS — D631 Anemia in chronic kidney disease: Secondary | ICD-10-CM

## 2016-08-12 LAB — CBC WITH DIFFERENTIAL/PLATELET
BASOS ABS: 0 {cells}/uL (ref 0–200)
Basophils Relative: 0 %
EOS ABS: 184 {cells}/uL (ref 15–500)
Eosinophils Relative: 4 %
HCT: 37.2 % — ABNORMAL LOW (ref 38.5–50.0)
Hemoglobin: 12.2 g/dL — ABNORMAL LOW (ref 13.2–17.1)
LYMPHS PCT: 40 %
Lymphs Abs: 1840 cells/uL (ref 850–3900)
MCH: 28.9 pg (ref 27.0–33.0)
MCHC: 32.8 g/dL (ref 32.0–36.0)
MCV: 88.2 fL (ref 80.0–100.0)
MONOS PCT: 9 %
MPV: 10 fL (ref 7.5–12.5)
Monocytes Absolute: 414 cells/uL (ref 200–950)
NEUTROS PCT: 47 %
Neutro Abs: 2162 cells/uL (ref 1500–7800)
PLATELETS: 197 10*3/uL (ref 140–400)
RBC: 4.22 MIL/uL (ref 4.20–5.80)
RDW: 14.3 % (ref 11.0–15.0)
WBC: 4.6 10*3/uL (ref 3.8–10.8)

## 2016-08-12 LAB — LIPID PANEL
CHOL/HDL RATIO: 2.8 ratio (ref <5.0)
CHOLESTEROL: 165 mg/dL (ref <200)
HDL: 60 mg/dL (ref >40)
LDL Cholesterol: 92 mg/dL (ref <100)
TRIGLYCERIDES: 65 mg/dL (ref <150)
VLDL: 13 mg/dL (ref <30)

## 2016-08-12 LAB — COMPLETE METABOLIC PANEL WITH GFR
ALBUMIN: 3.9 g/dL (ref 3.6–5.1)
ALK PHOS: 43 U/L (ref 40–115)
ALT: 14 U/L (ref 9–46)
AST: 18 U/L (ref 10–35)
BUN: 21 mg/dL (ref 7–25)
CALCIUM: 9 mg/dL (ref 8.6–10.3)
CHLORIDE: 106 mmol/L (ref 98–110)
CO2: 26 mmol/L (ref 20–31)
CREATININE: 1.32 mg/dL — AB (ref 0.70–1.11)
GFR, Est African American: 58 mL/min — ABNORMAL LOW (ref >=60)
GFR, Est Non African American: 51 mL/min — ABNORMAL LOW (ref >=60)
GLUCOSE: 112 mg/dL — AB (ref 65–99)
Potassium: 4.3 mmol/L (ref 3.5–5.3)
Sodium: 139 mmol/L (ref 135–146)
Total Bilirubin: 0.4 mg/dL (ref 0.2–1.2)
Total Protein: 6.9 g/dL (ref 6.1–8.1)

## 2016-08-12 LAB — HEMOGLOBIN A1C
Hgb A1c MFr Bld: 8.3 % — ABNORMAL HIGH (ref <5.7)
Mean Plasma Glucose: 192 mg/dL

## 2016-08-12 MED ORDER — GLUCOSE BLOOD VI STRP: ORAL_STRIP | 5 refills | Status: DC

## 2016-08-12 NOTE — Assessment & Plan Note (Signed)
Appreciate Dr. Juleen China following patient; will check labs and send him a copy; avoid NSAIDs

## 2016-08-12 NOTE — Assessment & Plan Note (Signed)
Check CBC 

## 2016-08-12 NOTE — Telephone Encounter (Signed)
Refills sent

## 2016-08-12 NOTE — Progress Notes (Signed)
BP 116/60   Pulse 82   Temp 98.2 F (36.8 C) (Oral)   Resp 16   Wt 177 lb 6.4 oz (80.5 kg)   SpO2 96%   BMI 24.74 kg/m    Subjective:    Patient ID: Brian [name], male    DOB: 21-Nov-1933, 82 y.o.   MRN: [id number]  HPI: Brian [name] is a 82 y.o. male  Chief Complaint  Patient presents with  . Medication Refill   He is here for f/u; type 2 diabetes; eye appt this week; just saw kidney doctor yesterday, wants me to check kidney function; "doing fine"; he brought his FSBS readings in; at night, he might go up a few notches to bring down (insulin); taking Januvia; range 107-164 in December; avoiding sweets and cake; also has anemia associated with CKD  Sees heart doctor in [place]; no chest pain; has mild aortic stenosis  He sees the urologist in [place], renal mass is followed by urologist  Rash from time to time in the groin; has cream  Relevant past medical, surgical, family and social history reviewed Past Medical History:  Diagnosis Date  . Aortic stenosis, mild 09/21/2015  . Arthritis   . Bladder stone   . History of bladder stone   . Hyperlipidemia   . Hypertension   . Nocturia   . Renal disorder   . Type 2 diabetes mellitus ([place])    Past Surgical History:  Procedure Laterality Date  . CYSTOSCOPY/RETROGRADE/URETEROSCOPY/STONE EXTRACTION WITH BASKET N/A 03/22/2013   Procedure: CYSTOSCOPY BLADDER STONE STONE EXTRACTION;  Surgeon: [name], MD;  Location: [facility];  Service: Urology;  Laterality: N/A;  CYSTOSCOPY    . TONSILLECTOMY     Family History  Problem Relation Age of Onset  . Congestive Heart Failure     Social History  Substance Use Topics  . Smoking status: Former Smoker    Packs/day: 0.75    Years: 39.00    Types: Cigarettes    Quit date: 03/21/1993  . Smokeless tobacco: Never Used  . Alcohol use 0.0 oz/week   Interim medical history since last visit reviewed. Allergies and medications  reviewed  Review of Systems  Cardiovascular: Negative for chest pain.  Gastrointestinal: Negative for abdominal pain.   Per HPI unless specifically indicated above     Objective:    BP 116/60   Pulse 82   Temp 98.2 F (36.8 C) (Oral)   Resp 16   Wt 177 lb 6.4 oz (80.5 kg)   SpO2 96%   BMI 24.74 kg/m   Wt Readings from Last 3 Encounters:  08/12/16 177 lb 6.4 oz (80.5 kg)  05/05/16 175 lb (79.4 kg)  03/18/16 174 lb 6.4 oz (79.1 kg)    Physical Exam  Constitutional: He appears well-developed and well-nourished. No distress.  HENT:  Head: Normocephalic and atraumatic.  Eyes: EOM are normal. No scleral icterus.  Neck: No thyromegaly present.  Cardiovascular: Normal rate and regular rhythm.   Murmur heard.  Systolic murmur is present with a grade of 2/6  Pulmonary/Chest: Effort normal and breath sounds normal.  Abdominal: Soft. Bowel sounds are normal. He exhibits no distension.  Musculoskeletal: He exhibits no edema.  Neurological: Coordination normal.  Skin: Skin is warm and dry. No pallor.  Psychiatric: He has a normal mood and affect. His behavior is normal. Judgment and thought content normal. His mood appears not anxious.   Diabetic Foot Form - Detailed   Diabetic Foot Exam -  detailed Diabetic Foot exam was performed with the following findings:  Yes 08/12/2016  8:23 AM  Visual Foot Exam completed.:  Yes  Are the toenails ingrown?:  No Normal Range of Motion:  Yes Pulse Foot Exam completed.:  Yes  Right Dorsalis Pedis:  Present Left Dorsalis Pedis:  Present  Sensory Foot Exam Completed.:  Yes Swelling:  No Semmes-Weinstein Monofilament Test R Site 1-Great Toe:  Pos L Site 1-Great Toe:  Pos  R Site 4:  Pos L Site 4:  Pos  R Site 5:  Pos L Site 5:  Pos          Assessment & Plan:   Problem List Items Addressed This Visit      Endocrine   Type 2 diabetes, controlled, with renal manifestation (HCC)    Check A1c today; check urine microalbumin:creatinine;  seeing eye doctor this week; foot exam by MD here today; check feet every night; follow dietary restrictions; checking FSBS once or twice a day, reviewed glucometer log;       Relevant Orders   Hemoglobin A1c (Completed)   Microalbumin / creatinine urine ratio (Completed)     Genitourinary   Chronic kidney disease (CKD), stage III (moderate)    Appreciate Dr. Juleen China following patient; will check labs and send him a copy; avoid NSAIDs      Anemia of chronic renal failure    Check CBC      Relevant Orders   CBC with Differential/Platelet (Completed)     Other   Hyperlipidemia LDL goal <100    Continue statin, taking just three days a week; goal LDL under 100; limit fatty meats      Relevant Orders   Lipid panel (Completed)   Encounter for medication monitoring    Monitor sgpt on statin      Relevant Orders   COMPLETE METABOLIC PANEL WITH GFR (Completed)      Follow up plan: No Follow-up on file.  An after-visit summary was printed and given to the patient at [facility].  Please see the patient instructions which may contain other information and recommendations beyond what is mentioned above in the assessment and plan.  No orders of the defined types were placed in this encounter.   Orders Placed This Encounter  Procedures  . Lipid panel  . CBC with Differential/Platelet  . Hemoglobin A1c  . Microalbumin / creatinine urine ratio  . COMPLETE METABOLIC PANEL WITH GFR

## 2016-08-12 NOTE — Assessment & Plan Note (Signed)
Monitor sgpt on statin 

## 2016-08-12 NOTE — Patient Instructions (Addendum)
Socks first after showering Try to limit saturated fats in your diet (bologna, hot dogs, barbeque, cheeseburgers, hamburgers, steak, bacon, sausage, cheese, etc.) and get more fresh fruits, vegetables, and whole grains Return in 3 months for follow-up and fasting labs

## 2016-08-12 NOTE — Assessment & Plan Note (Signed)
Continue statin, taking just three days a week; goal LDL under 100; limit fatty meats

## 2016-08-12 NOTE — Assessment & Plan Note (Signed)
Check A1c today; check urine microalbumin:creatinine; seeing eye doctor this week; foot exam by MD here today; check feet every night; follow dietary restrictions; checking FSBS once or twice a day, reviewed glucometer log;

## 2016-08-13 LAB — MICROALBUMIN / CREATININE URINE RATIO
Creatinine, Urine: 209 mg/dL (ref 20–370)
Microalb Creat Ratio: 7 mcg/mg creat (ref <30)
Microalb, Ur: 1.4 mg/dL

## 2016-08-14 ENCOUNTER — Encounter: Payer: Self-pay | Admitting: Family Medicine

## 2016-08-14 ENCOUNTER — Other Ambulatory Visit: Payer: Self-pay

## 2016-08-15 LAB — HM DIABETES EYE EXAM

## 2016-08-22 ENCOUNTER — Telehealth: Payer: Self-pay | Admitting: Family Medicine

## 2016-08-22 NOTE — Telephone Encounter (Signed)
PT SAID THAT THE DR WANTED HIM TO CALL HER AND LET HER KNOW HOW HIS SUGAR LEVELS WERE RUNNING. HE SAID THAT IT HAS BEEN RUNNING GOOD. ON THE 23RD IT WAS 130 BEFORE EATING, 24TH 102, 25TH 121, 26TH 114, 27TH WAS 125, 28TH WAS 115, 29TH WAS 113. THIS IS ALL BEFORE EATING IN THE MORNING.

## 2016-08-22 NOTE — Telephone Encounter (Signed)
Thank you; that's great news

## 2016-08-26 ENCOUNTER — Telehealth: Payer: Self-pay

## 2016-08-26 NOTE — Telephone Encounter (Signed)
Patient called regarding his lab resutls. Patient stated Dr. Cyndia Diver His nephrologist would like an update on his last labs to make sure his kidney are function normal. I went ahead and faxed over his lab results to Dr. Garen Lah. Patient asked to make sure his readings for his sugar levels where .ord and sent to Dr. Sanda Klein. Mention to patient that It was recorded and sent to Dr. Sanda Klein.

## 2016-09-02 ENCOUNTER — Telehealth: Payer: Self-pay | Admitting: Family Medicine

## 2016-09-02 NOTE — Telephone Encounter (Signed)
Already sent refill with new injection details of 18 units

## 2016-09-02 NOTE — Telephone Encounter (Signed)
Pt states his insulin was increased to 18 units and he wants to be sure his pharmacy knows that. Please advise. [facility]

## 2016-09-12 ENCOUNTER — Telehealth: Payer: Self-pay | Admitting: Cardiovascular Disease

## 2016-09-12 NOTE — Telephone Encounter (Signed)
Royal Piedra ( RN Case Manger ) is calling to find out if Mr. Kamath has a diagnosis of CHF and what was his ejection fraction . Please call   [facility]

## 2016-09-12 NOTE — Telephone Encounter (Signed)
LM for Royal Piedra, RN on confidential VM that patient has diagnosis of grade 1 DD with EF of 55-60% by echo Jul 20, 2015

## 2016-09-18 ENCOUNTER — Telehealth: Payer: Self-pay | Admitting: Family Medicine

## 2016-09-18 MED ORDER — INSULIN DETEMIR 100 UNIT/ML FLEXPEN: 20.0000 [IU] | PEN_INJECTOR | Freq: Every day | SUBCUTANEOUS | 5 refills | Status: DC

## 2016-09-18 MED ORDER — GLUCOSE BLOOD VI STRP: ORAL_STRIP | 11 refills | Status: DC

## 2016-09-18 NOTE — Telephone Encounter (Signed)
I spoke with patient; agree with plan

## 2016-09-18 NOTE — Telephone Encounter (Signed)
Patient stated that he checks his blood sugar 3 times daily and he needs more testing strips.  Patient would also like for his Levemir to be increased to 20 units daily.

## 2016-09-19 ENCOUNTER — Other Ambulatory Visit: Payer: Self-pay

## 2016-09-19 MED ORDER — INSULIN PEN NEEDLE 31G X 5 MM MISC: 3 refills | Status: DC

## 2016-09-19 NOTE — Telephone Encounter (Signed)
rx printed, ready to fax

## 2016-09-23 ENCOUNTER — Other Ambulatory Visit: Payer: Self-pay | Admitting: Family Medicine

## 2016-09-24 ENCOUNTER — Other Ambulatory Visit: Payer: Self-pay | Admitting: Family Medicine

## 2016-09-25 NOTE — Telephone Encounter (Signed)
Last Cr and K+ reviewed; Rx approved 

## 2016-09-26 ENCOUNTER — Other Ambulatory Visit: Payer: Self-pay | Admitting: Family Medicine

## 2016-09-26 NOTE — Telephone Encounter (Signed)
Last ALT reviewed; Rx approved 

## 2016-09-29 ENCOUNTER — Other Ambulatory Visit: Payer: Self-pay | Admitting: Family Medicine

## 2016-09-29 DIAGNOSIS — K581 Irritable bowel syndrome with constipation: Secondary | ICD-10-CM

## 2016-10-18 ENCOUNTER — Other Ambulatory Visit: Payer: Self-pay | Admitting: Family Medicine

## 2016-10-18 DIAGNOSIS — I10 Essential (primary) hypertension: Secondary | ICD-10-CM

## 2016-10-18 NOTE — Telephone Encounter (Signed)
Rx approved

## 2016-10-21 ENCOUNTER — Other Ambulatory Visit: Payer: Self-pay | Admitting: Family Medicine

## 2016-10-21 NOTE — Telephone Encounter (Signed)
rx sent

## 2016-11-03 ENCOUNTER — Encounter: Payer: Self-pay | Admitting: Family Medicine

## 2016-11-03 ENCOUNTER — Ambulatory Visit (INDEPENDENT_AMBULATORY_CARE_PROVIDER_SITE_OTHER): Payer: Medicare Other | Admitting: Family Medicine

## 2016-11-03 DIAGNOSIS — I350 Nonrheumatic aortic (valve) stenosis: Secondary | ICD-10-CM | POA: Diagnosis not present

## 2016-11-03 DIAGNOSIS — N183 Chronic kidney disease, stage 3 unspecified: Secondary | ICD-10-CM

## 2016-11-03 DIAGNOSIS — E1122 Type 2 diabetes mellitus with diabetic chronic kidney disease: Secondary | ICD-10-CM | POA: Diagnosis not present

## 2016-11-03 DIAGNOSIS — D631 Anemia in chronic kidney disease: Secondary | ICD-10-CM

## 2016-11-03 DIAGNOSIS — E785 Hyperlipidemia, unspecified: Secondary | ICD-10-CM

## 2016-11-03 DIAGNOSIS — N2581 Secondary hyperparathyroidism of renal origin: Secondary | ICD-10-CM | POA: Diagnosis not present

## 2016-11-03 DIAGNOSIS — Z5181 Encounter for therapeutic drug level monitoring: Secondary | ICD-10-CM | POA: Diagnosis not present

## 2016-11-03 DIAGNOSIS — I10 Essential (primary) hypertension: Secondary | ICD-10-CM | POA: Diagnosis not present

## 2016-11-03 NOTE — Assessment & Plan Note (Signed)
Foot exam by MD today; patient checking sugars 2-3 x a day; no lows; healthy eating encouraged; eye exam UTD; return next week for A1c; urine microalbumin done in Dec 2017, reviewed, normal

## 2016-11-03 NOTE — Assessment & Plan Note (Signed)
Managed by nephrologist 

## 2016-11-03 NOTE — Progress Notes (Signed)
BP 120/62   Pulse 95   Temp 97.9 F (36.6 C) (Oral)   Resp 16   Wt 174 lb 7 oz (79.1 kg)   SpO2 98%   BMI 24.33 kg/m    Subjective:    Patient ID: Brian [name], male    DOB: 15-Mar-1934, 83 y.o.   MRN: [id number]  HPI: Brian [name] is a 83 y.o. male  Chief Complaint  Patient presents with  . Follow-up    6 mnths   Type 2 diabetes; brought in readings, checking 2-3x a day; no lows; highest 186; he likes to watch it before he takes his insulin; he will dose 15 units instead of 20 for example if it's low; his FSBS log just has the morning sugars written down Lab Results  Component Value Date   HGBA1C 8.3 (H) 08/12/2016    HTN; well-controlled; he tries to avoid salt, just a little bit now and then on certain foods; he is on amlodipine and quinapril  No chest pain, no SHOB; seeing cardiologist for valve issue; no symptoms  He sees kidney doctor once a year for CKD and anemia associated with CKD as well as secondary parathyroid dz related to CKD  Depression screen [facility] 2/9 11/03/2016 05/05/2016 12/24/2015 10/23/2015 09/17/2015  Decreased Interest 0 0 0 0 0  Down, Depressed, Hopeless 0 0 0 0 0  PHQ - 2 Score 0 0 0 0 0   Relevant past medical, surgical, family and social history reviewed Past Medical History:  Diagnosis Date  . Anemia of chronic renal failure 05/08/2015  . Aortic stenosis, mild 09/21/2015  . Arthritis   . Bladder stone   . Chronic kidney disease (CKD), stage III (moderate) 05/08/2015   Followed by Dr. Rolly Salter   . History of bladder stone   . Hyperlipidemia   . Hypertension   . Nocturia   . Renal disorder   . Type 2 diabetes mellitus ([place])    Past Surgical History:  Procedure Laterality Date  . CYSTOSCOPY/RETROGRADE/URETEROSCOPY/STONE EXTRACTION WITH BASKET N/A 03/22/2013   Procedure: CYSTOSCOPY BLADDER STONE STONE EXTRACTION;  Surgeon: [name], MD;  Location: [place];  Service: Urology;  Laterality: N/A;  CYSTOSCOPY      . TONSILLECTOMY     Family History  Problem Relation Age of Onset  . Congestive Heart Failure     Social History  Substance Use Topics  . Smoking status: Former Smoker    Packs/day: 0.75    Years: 39.00    Types: Cigarettes    Quit date: 03/21/1993  . Smokeless tobacco: Never Used  . Alcohol use 0.0 oz/week    Interim medical history since last visit reviewed. Allergies and medications reviewed  Review of Systems Per HPI unless specifically indicated above     Objective:    BP 120/62   Pulse 95   Temp 97.9 F (36.6 C) (Oral)   Resp 16   Wt 174 lb 7 oz (79.1 kg)   SpO2 98%   BMI 24.33 kg/m   Wt Readings from Last 3 Encounters:  11/03/16 174 lb 7 oz (79.1 kg)  08/12/16 177 lb 6.4 oz (80.5 kg)  05/05/16 175 lb (79.4 kg)    Physical Exam  Constitutional: He appears well-developed and well-nourished. No distress.  HENT:  Head: Normocephalic and atraumatic.  Eyes: EOM are normal. No scleral icterus.  Neck: No thyromegaly present.  Cardiovascular: Normal rate and regular rhythm.   Murmur heard.  Systolic murmur is  present with a grade of 2/6  Pulmonary/Chest: Effort normal and breath sounds normal.  Abdominal: Soft. Bowel sounds are normal. He exhibits no distension.  Musculoskeletal: He exhibits no edema.  Neurological: Coordination normal.  Skin: Skin is warm and dry. No pallor.  Psychiatric: He has a normal mood and affect. His behavior is normal. Judgment and thought content normal. His mood appears not anxious.   Diabetic Foot Form - Detailed   Diabetic Foot Exam - detailed Diabetic Foot exam was performed with the following findings:  Yes 11/03/2016  8:25 AM  Visual Foot Exam completed.:  Yes  Are the toenails ingrown?:  No Normal Range of Motion:  Yes Pulse Foot Exam completed.:  Yes  Right Dorsalis Pedis:  Present Left Dorsalis Pedis:  Present  Sensory Foot Exam Completed.:  Yes Swelling:  No Semmes-Weinstein Monofilament Test R Site 1-Great Toe:   Pos L Site 1-Great Toe:  Pos  R Site 4:  Pos L Site 4:  Pos  R Site 5:  Pos L Site 5:  Pos        Results for orders placed or performed in visit on 08/19/16  HM DIABETES EYE EXAM  Result Value Ref Range   HM Diabetic Eye Exam No Retinopathy No Retinopathy      Assessment & Plan:   Problem List Items Addressed This Visit      Cardiovascular and Mediastinum   Hypertension goal BP (blood pressure) < 140/90 (Chronic)    Well-controlled; try to limit sodium      Aortic stenosis, mild (Chronic)    Seeing cardiologist in [place]; asymptomatic        Endocrine   Type 2 diabetes, controlled, with renal manifestation (HCC) (Chronic)    Foot exam by MD today; patient checking sugars 2-3 x a day; no lows; healthy eating encouraged; eye exam UTD; return next week for A1c; urine microalbumin done in Dec 2017, reviewed, normal      Relevant Orders   Hemoglobin A1c   Secondary hyperparathyroidism of renal origin ([place]) (Chronic)    Managed by nephrologist        Genitourinary   Chronic kidney disease (CKD), stage III (moderate) (Chronic)    Managed by nephrologist; associated parathyroid disease and anemia; control BP; continue ACE-I      Anemia of chronic renal failure (Chronic)    Managed by nephrologist        Other   Hyperlipidemia LDL goal <100 (Chronic)    Check lipids next week      Relevant Orders   Lipid panel   Encounter for medication monitoring    Monitor Cr and K+ on ACE-I periodically; last was less than 3 months ago; plan to check these again in June          Follow up plan: Return in about 3 months (around 02/17/2017) for fasting labs and visit with Dr. Sanda Klein.  An after-visit summary was printed and given to the patient at [place].  Please see the patient instructions which may contain other information and recommendations beyond what is mentioned above in the assessment and plan.  No orders of the defined types were placed in this  encounter.   Orders Placed This Encounter  Procedures  . Hemoglobin A1c  . Lipid panel

## 2016-11-03 NOTE — Assessment & Plan Note (Addendum)
Monitor Cr and K+ on ACE-I periodically; last was less than 3 months ago; plan to check these again in June

## 2016-11-03 NOTE — Assessment & Plan Note (Signed)
Seeing cardiologist in [place]; asymptomatic

## 2016-11-03 NOTE — Assessment & Plan Note (Signed)
Managed by nephrologist; associated parathyroid disease and anemia; control BP; continue ACE-I

## 2016-11-03 NOTE — Assessment & Plan Note (Signed)
Well-controlled; try to limit sodium 

## 2016-11-03 NOTE — Patient Instructions (Signed)
Return for fasting labs on or after November 10, 2016, too early for 3 month labs Keep up the great job checking your feet and your sugars Try to limit sweets and salt

## 2016-11-03 NOTE — Assessment & Plan Note (Signed)
Check lipids next week 

## 2016-11-05 ENCOUNTER — Other Ambulatory Visit: Payer: Self-pay | Admitting: Family Medicine

## 2016-11-05 NOTE — Telephone Encounter (Signed)
Please call patient and find out how much insulin he is giving himself; he was just here and med list says twenty units; pharmacy thinks he might be using more Thank you

## 2016-11-05 NOTE — Telephone Encounter (Signed)
Tiffany, would you mind calling the patient? Thank you

## 2016-11-06 ENCOUNTER — Telehealth: Payer: Self-pay | Admitting: Family Medicine

## 2016-11-06 NOTE — Telephone Encounter (Deleted)
Please invite patient to come in for Medicare Wellness visit if due; if not, bring him in anyway so we can test memory Thank you

## 2016-11-06 NOTE — Telephone Encounter (Signed)
Spoke with patient's wife Brian [name] and she states patient is only taking 20 units of Levemir a day. Spoke with her about the concern of his insulin lasting longer than it has. But she states she has observed him and knows from talking to him he only takes the 20 units once daily, also he is concern about running out. He will not have any Insulin left after Friday.

## 2016-11-06 NOTE — Telephone Encounter (Signed)
I spoke with pharmacist; she thinks he may have misplaced a pen She has no concerns over his memory; he is in the frequently and they've never had an issue He can pick up Levemir on March 23rd Patient says he will run out before then We do not have Levemir samples, but I do have Toujeo sample I talked to him about this substitute; do NOT use together He explained it back to me beautifully; use the Levemir until he runs out, then use the Toujeo Never use BOTH on the same day; this is just a temporary fix to prevent him from running out of insulin He will pick up sample

## 2016-11-11 ENCOUNTER — Other Ambulatory Visit: Payer: Self-pay | Admitting: Family Medicine

## 2016-11-11 LAB — LIPID PANEL
CHOLESTEROL: 164 mg/dL (ref <200)
HDL: 63 mg/dL (ref >40)
LDL CALC: 88 mg/dL (ref <100)
Total CHOL/HDL Ratio: 2.6 Ratio (ref <5.0)
Triglycerides: 64 mg/dL (ref <150)
VLDL: 13 mg/dL (ref <30)

## 2016-11-11 LAB — HEMOGLOBIN A1C
Hgb A1c MFr Bld: 8.5 % — ABNORMAL HIGH (ref <5.7)
Mean Plasma Glucose: 197 mg/dL

## 2016-11-11 MED ORDER — INSULIN DETEMIR 100 UNIT/ML FLEXPEN: 25.0000 [IU] | PEN_INJECTOR | Freq: Every day | SUBCUTANEOUS | 6 refills | Status: DC

## 2016-11-14 ENCOUNTER — Other Ambulatory Visit: Payer: Self-pay | Admitting: Family Medicine

## 2016-11-25 ENCOUNTER — Other Ambulatory Visit: Payer: Self-pay | Admitting: Family Medicine

## 2016-11-25 DIAGNOSIS — K581 Irritable bowel syndrome with constipation: Secondary | ICD-10-CM

## 2017-01-22 ENCOUNTER — Other Ambulatory Visit: Payer: Self-pay | Admitting: Family Medicine

## 2017-02-13 ENCOUNTER — Encounter: Payer: Self-pay | Admitting: Family Medicine

## 2017-02-18 ENCOUNTER — Other Ambulatory Visit: Payer: Self-pay | Admitting: Family Medicine

## 2017-02-20 ENCOUNTER — Ambulatory Visit: Payer: Medicare Other | Admitting: Family Medicine

## 2017-02-26 ENCOUNTER — Other Ambulatory Visit: Payer: Self-pay | Admitting: Family Medicine

## 2017-02-26 NOTE — Telephone Encounter (Signed)
Reviewed nephrology note and last Cr and K+ Refills approved

## 2017-02-27 ENCOUNTER — Ambulatory Visit: Payer: Medicare Other | Admitting: Family Medicine

## 2017-02-27 ENCOUNTER — Encounter: Payer: Self-pay | Admitting: Family Medicine

## 2017-02-27 ENCOUNTER — Ambulatory Visit (INDEPENDENT_AMBULATORY_CARE_PROVIDER_SITE_OTHER): Payer: Medicare Other | Admitting: Family Medicine

## 2017-02-27 DIAGNOSIS — E1122 Type 2 diabetes mellitus with diabetic chronic kidney disease: Secondary | ICD-10-CM

## 2017-02-27 DIAGNOSIS — I350 Nonrheumatic aortic (valve) stenosis: Secondary | ICD-10-CM | POA: Diagnosis not present

## 2017-02-27 DIAGNOSIS — I10 Essential (primary) hypertension: Secondary | ICD-10-CM | POA: Diagnosis not present

## 2017-02-27 DIAGNOSIS — Z5181 Encounter for therapeutic drug level monitoring: Secondary | ICD-10-CM | POA: Diagnosis not present

## 2017-02-27 DIAGNOSIS — E785 Hyperlipidemia, unspecified: Secondary | ICD-10-CM

## 2017-02-27 DIAGNOSIS — N183 Chronic kidney disease, stage 3 unspecified: Secondary | ICD-10-CM

## 2017-02-27 LAB — LIPID PANEL
CHOL/HDL RATIO: 3.1 ratio (ref <5.0)
CHOLESTEROL: 193 mg/dL (ref <200)
HDL: 63 mg/dL (ref >40)
LDL Cholesterol: 113 mg/dL — ABNORMAL HIGH (ref <100)
Triglycerides: 83 mg/dL (ref <150)
VLDL: 17 mg/dL (ref <30)

## 2017-02-27 LAB — HEPATIC FUNCTION PANEL
ALK PHOS: 50 U/L (ref 40–115)
ALT: 10 U/L (ref 9–46)
AST: 17 U/L (ref 10–35)
Albumin: 4 g/dL (ref 3.6–5.1)
BILIRUBIN INDIRECT: 0.3 mg/dL (ref 0.2–1.2)
BILIRUBIN TOTAL: 0.4 mg/dL (ref 0.2–1.2)
Bilirubin, Direct: 0.1 mg/dL (ref <=0.2)
Total Protein: 7 g/dL (ref 6.1–8.1)

## 2017-02-27 NOTE — Assessment & Plan Note (Signed)
Limit saturated fats; check lipids today; continue statin 

## 2017-02-27 NOTE — Assessment & Plan Note (Signed)
Check A1c today; appreciate patient bringing in FSBS; foot exam UTD today by MD; he'll see eye doctor soon

## 2017-02-27 NOTE — Assessment & Plan Note (Signed)
Managed by Dr. Juleen China; avoiding NSAIDs; hydrate

## 2017-02-27 NOTE — Assessment & Plan Note (Signed)
Check liver enzymes today; kidney doctor monitoring renal function

## 2017-02-27 NOTE — Progress Notes (Signed)
BP 130/74 (BP Location: Left Arm, Patient Position: Sitting, Cuff Size: Normal)   Pulse 98   Temp 98.2 F (36.8 C) (Oral)   Resp 16   Wt 177 lb 6.4 oz (80.5 kg)   SpO2 97%   BMI 24.74 kg/m    Subjective:    Patient ID: Brian [name], male    DOB: 1933/11/23, 83 y.o.   MRN: [id number]  HPI: Brian [name] is a 83 y.o. male  Chief Complaint  Patient presents with  . Follow-up    3 month     HPI Type 2 diabetes; checking FSBS three times a day; reviewed readings with him; 125 this morning He is using insulin, using FSBS to adjust insulin at times  Lab Results  Component Value Date   HGBA1C 8.5 (H) 11/10/2016    CKD; sees his kidney doctor 1-2x a year; just saw him Stage 3; reviewed last note; no NSAIDs  No chest pain; breathing doing okay; urinating okay (on meds)  HTN; controlled today; limits salt, only what his wife uses when cooking; not eating much pork  High cholesterol; no pork and no eggs; taking crestor; no myalgias  Aortic stenosis, very mild on echo 2016; cardiologist in [place]   Depression screen [facility] 2/9 02/27/2017 11/03/2016 05/05/2016 12/24/2015 10/23/2015  Decreased Interest 0 0 0 0 0  Down, Depressed, Hopeless 0 0 0 0 0  PHQ - 2 Score 0 0 0 0 0    Relevant past medical, surgical, family and social history reviewed Past Medical History:  Diagnosis Date  . Anemia of chronic renal failure 05/08/2015  . Aortic stenosis, mild 09/21/2015  . Arthritis   . Bladder stone   . Chronic kidney disease (CKD), stage III (moderate) 05/08/2015   Followed by Dr. Rolly Salter   . History of bladder stone   . Hyperlipidemia   . Hypertension   . Nocturia   . Renal disorder   . Type 2 diabetes mellitus ([place])    Past Surgical History:  Procedure Laterality Date  . CYSTOSCOPY/RETROGRADE/URETEROSCOPY/STONE EXTRACTION WITH BASKET N/A 03/22/2013   Procedure: CYSTOSCOPY BLADDER STONE STONE EXTRACTION;  Surgeon: [name], MD;  Location: [facility];  Service: Urology;  Laterality: N/A;  CYSTOSCOPY    . TONSILLECTOMY     Family History  Problem Relation Age of Onset  . Congestive Heart Failure Unknown    Social History   Social History  . Marital status: Married    Spouse name: N/A  . Number of children: N/A  . Years of education: N/A   Occupational History  . Retired    Social History Main Topics  . Smoking status: Former Smoker    Packs/day: 0.75    Years: 39.00    Types: Cigarettes    Quit date: 03/21/1993  . Smokeless tobacco: Never Used  . Alcohol use 0.0 oz/week  . Drug use: No  . Sexual activity: Yes   Other Topics Concern  . Not on file   Social History Narrative   Lives with wife in [place], [place].    Interim medical history since last visit reviewed. Allergies and medications reviewed  Review of Systems Per HPI unless specifically indicated above     Objective:    BP 130/74 (BP Location: Left Arm, Patient Position: Sitting, Cuff Size: Normal)   Pulse 98   Temp 98.2 F (36.8 C) (Oral)   Resp 16   Wt 177 lb 6.4 oz (80.5 kg)   SpO2  97%   BMI 24.74 kg/m   Wt Readings from Last 3 Encounters:  02/27/17 177 lb 6.4 oz (80.5 kg)  11/03/16 174 lb 7 oz (79.1 kg)  08/12/16 177 lb 6.4 oz (80.5 kg)    Physical Exam  Constitutional: He appears well-developed and well-nourished. No distress.  HENT:  Head: Normocephalic and atraumatic.  Eyes: EOM are normal. No scleral icterus.  Neck: No thyromegaly present.  Cardiovascular: Normal rate and regular rhythm.   Murmur heard.  Systolic murmur is present with a grade of 2/6  Pulmonary/Chest: Effort normal and breath sounds normal.  Abdominal: Soft. Bowel sounds are normal. He exhibits no distension.  Musculoskeletal: He exhibits no edema.  Neurological: Coordination normal.  Skin: Skin is warm and dry. No pallor.  Psychiatric: He has a normal mood and affect. His behavior is normal. Judgment and thought content normal. His mood appears not  anxious.   Diabetic Foot Form - Detailed   Diabetic Foot Exam - detailed Diabetic Foot exam was performed with the following findings:  Yes 02/27/2017  8:19 AM  Visual Foot Exam completed.:  Yes  Pulse Foot Exam completed.:  Yes  Right Dorsalis Pedis:  Present Left Dorsalis Pedis:  Present  Sensory Foot Exam Completed.:  Yes Semmes-Weinstein Monofilament Test R Site 1-Great Toe:  Pos L Site 1-Great Toe:  Pos        Results for orders placed or performed in visit on 11/03/16  Hemoglobin A1c  Result Value Ref Range   Hgb A1c MFr Bld 8.5 (H) <5.7 %   Mean Plasma Glucose 197 mg/dL  Lipid panel  Result Value Ref Range   Cholesterol 164 <200 mg/dL   Triglycerides 64 <150 mg/dL   HDL 63 >40 mg/dL   Total CHOL/HDL Ratio 2.6 <5.0 Ratio   VLDL 13 <30 mg/dL   LDL Cholesterol 88 <100 mg/dL      Assessment & Plan:   Problem List Items Addressed This Visit      Cardiovascular and Mediastinum   Hypertension goal BP (blood pressure) < 140/90 (Chronic)    Well-controlled      Aortic stenosis, mild (Chronic)    Managed by cardiologist in [place]        Endocrine   Type 2 diabetes, controlled, with renal manifestation (HCC) (Chronic)    Check A1c today; appreciate patient bringing in FSBS; foot exam UTD today by MD; he'll see eye doctor soon      Relevant Orders   Hemoglobin A1c     Genitourinary   Chronic kidney disease (CKD), stage III (moderate) (Chronic)    Managed by Dr. Juleen China; avoiding NSAIDs; hydrate        Other   Hyperlipidemia LDL goal <100 (Chronic)    Limit saturated fats; check lipids today; continue statin      Relevant Orders   Lipid panel   Encounter for medication monitoring    Check liver enzymes today; kidney doctor monitoring renal function      Relevant Orders   Hepatic function panel       Follow up plan: Return in about 3 months (around 05/30/2017) for twenty minute follow-up with fasting labs.  An after-visit summary was printed and  given to the patient at [place].  Please see the patient instructions which may contain other information and recommendations beyond what is mentioned above in the assessment and plan.  No orders of the defined types were placed in this encounter.   Orders Placed This Encounter  Procedures  .  Hemoglobin A1c  . Lipid panel  . Hepatic function panel

## 2017-02-27 NOTE — Patient Instructions (Signed)
We'll get labs today Continue to check your fingerstick sugars up to 3x a day Call with any issues Please do see your eye doctor regularly, and have your eyes examined every year (or more often per his or her recommendation) Check your feet every night and let me know right away of any sores, infections, numbness, etc. Try to limit sweets, white bread, white rice, white potatoes

## 2017-02-27 NOTE — Assessment & Plan Note (Signed)
Well controlled 

## 2017-02-27 NOTE — Assessment & Plan Note (Signed)
Managed by cardiologist in [place]

## 2017-02-28 LAB — HEMOGLOBIN A1C
Hgb A1c MFr Bld: 9.1 % — ABNORMAL HIGH (ref <5.7)
MEAN PLASMA GLUCOSE: 214 mg/dL

## 2017-03-03 ENCOUNTER — Telehealth: Payer: Self-pay | Admitting: Family Medicine

## 2017-03-03 ENCOUNTER — Telehealth: Payer: Self-pay

## 2017-03-03 MED ORDER — LINAGLIPTIN 5 MG PO TABS: 5.0000 mg | ORAL_TABLET | Freq: Every day | ORAL | 5 refills | Status: DC

## 2017-03-03 NOTE — Telephone Encounter (Signed)
Patient med has already been changed

## 2017-03-03 NOTE — Telephone Encounter (Signed)
Pharmacy called, patient went to get new rx and asked for the side effects to be printed.  Listed on that was rash, so patient does not want to take this either.  Pharmacist stated he does not think the rash was coming from the med and I told him you didn't think so also.  Patient wants to go back on what he was on before? Please advise?

## 2017-03-03 NOTE — Telephone Encounter (Signed)
Called patient he has decided he wants to stay on the januvia instead because the tradjenta is more expensive.

## 2017-03-03 NOTE — Telephone Encounter (Signed)
I'm going to ask him to try this; what he was on before wasn't working (his A1c level was still over 8) Please ask him to give this a try I personally have never had a patient with rash with this medicine and I think he will do fine

## 2017-03-03 NOTE — Telephone Encounter (Signed)
PT HAS HAD AN RASH ON HIS THIGHS TO JANUVIA .

## 2017-03-03 NOTE — Telephone Encounter (Signed)
Patient states Brian [name] is giving him a rash, He wants to stop it please advise

## 2017-03-03 NOTE — Telephone Encounter (Signed)
Patient believes the rash on his legs is from [place]; I don't think so, but patient does Stop [place]; start tradjenta Patient notified by [facility]

## 2017-03-03 NOTE — Telephone Encounter (Signed)
Pt taking januvia and he does have red patches on his skin. He read the side effects and it states not to take [place] if you have a rash. He would like to know if you could put him on something different. [phone] or [phone]

## 2017-03-26 ENCOUNTER — Other Ambulatory Visit: Payer: Self-pay | Admitting: Family Medicine

## 2017-05-18 ENCOUNTER — Other Ambulatory Visit: Payer: Self-pay | Admitting: Family Medicine

## 2017-05-21 ENCOUNTER — Other Ambulatory Visit: Payer: Self-pay | Admitting: Family Medicine

## 2017-06-08 ENCOUNTER — Encounter: Payer: Self-pay | Admitting: Family Medicine

## 2017-06-08 ENCOUNTER — Other Ambulatory Visit: Payer: Self-pay | Admitting: Family Medicine

## 2017-06-08 ENCOUNTER — Ambulatory Visit (INDEPENDENT_AMBULATORY_CARE_PROVIDER_SITE_OTHER): Payer: Medicare Other | Admitting: Family Medicine

## 2017-06-08 VITALS — BP 122/64 | HR 92 | Temp 97.8°F | Resp 14 | Wt 179.8 lb

## 2017-06-08 DIAGNOSIS — E785 Hyperlipidemia, unspecified: Secondary | ICD-10-CM | POA: Diagnosis not present

## 2017-06-08 DIAGNOSIS — I350 Nonrheumatic aortic (valve) stenosis: Secondary | ICD-10-CM

## 2017-06-08 DIAGNOSIS — Z5181 Encounter for therapeutic drug level monitoring: Secondary | ICD-10-CM

## 2017-06-08 DIAGNOSIS — D631 Anemia in chronic kidney disease: Secondary | ICD-10-CM

## 2017-06-08 DIAGNOSIS — E1122 Type 2 diabetes mellitus with diabetic chronic kidney disease: Secondary | ICD-10-CM | POA: Diagnosis not present

## 2017-06-08 DIAGNOSIS — N183 Chronic kidney disease, stage 3 unspecified: Secondary | ICD-10-CM

## 2017-06-08 DIAGNOSIS — I5189 Other ill-defined heart diseases: Secondary | ICD-10-CM

## 2017-06-08 DIAGNOSIS — Z23 Encounter for immunization: Secondary | ICD-10-CM | POA: Diagnosis not present

## 2017-06-08 DIAGNOSIS — I10 Essential (primary) hypertension: Secondary | ICD-10-CM | POA: Diagnosis not present

## 2017-06-08 HISTORY — DX: Other ill-defined heart diseases: I51.89

## 2017-06-08 NOTE — Assessment & Plan Note (Signed)
Murmur is a little louder today; encouraged him to f/u with cardiologist

## 2017-06-08 NOTE — Assessment & Plan Note (Signed)
Well controlled 

## 2017-06-08 NOTE — Assessment & Plan Note (Signed)
Patient will check with pharmacist for less expensive insulin; continue the other

## 2017-06-08 NOTE — Patient Instructions (Addendum)
Ask your pharmacist to see what the cheapest insulin for you would be and I'll be glad to switch you over to that [facility] get labs today Please do see your cardiologist, as your murmur was a little louder today to me

## 2017-06-08 NOTE — Assessment & Plan Note (Signed)
Noted in cardiologist's documentation; no signs today of fluid overload

## 2017-06-08 NOTE — Progress Notes (Signed)
BP 122/64   Pulse 92   Temp 97.8 F (36.6 C) (Oral)   Resp 14   Wt 179 lb 12.8 oz (81.6 kg)   SpO2 98%   BMI 25.08 kg/m    Subjective:    Patient ID: Brian [name], male    DOB: Jun 08, 1934, 83 y.o.   MRN: [id number]  HPI: Brian [name] is a 83 y.o. male  Chief Complaint  Patient presents with  . Follow-up    HPI Patient is here for f/u Type 2 diabetes; on insulin and tradjenta; c/o the cost of the medicines; no hx of heart failure; the insulin is expensive High cholesterol; was on crestor but it was stopped in the hospital, he isn't sure why; maybe made him dizzy; more than 2 months ago; he thinks it was a mix up and did not have any problems with it, then he said; he thinks the heart doctor wants him on it too; he's willing to try to start back and see that goes Hypertension; not adding salt much at all Going to the eye doctor next week CKD stage 3, seeing nephrologist; avoiding NSAIDs  Depression screen [facility] 2/9 06/08/2017 02/27/2017 11/03/2016 05/05/2016 12/24/2015  Decreased Interest 0 0 0 0 0  Down, Depressed, Hopeless 0 0 0 0 0  PHQ - 2 Score 0 0 0 0 0    Relevant past medical, surgical, family and social history reviewed Past Medical History:  Diagnosis Date  . Anemia of chronic renal failure 05/08/2015  . Aortic stenosis, mild 09/21/2015  . Arthritis   . Bladder stone   . Chronic kidney disease (CKD), stage III (moderate) ([place]) 05/08/2015   Followed by Dr. Rolly Salter   . Diastolic dysfunction without heart failure 06/08/2017  . History of bladder stone   . Hyperlipidemia   . Hypertension   . Nocturia   . Renal disorder   . Type 2 diabetes mellitus ([place])    Past Surgical History:  Procedure Laterality Date  . CYSTOSCOPY/RETROGRADE/URETEROSCOPY/STONE EXTRACTION WITH BASKET N/A 03/22/2013   Procedure: CYSTOSCOPY BLADDER STONE STONE EXTRACTION;  Surgeon: [name], MD;  Location: [facility];  Service: Urology;  Laterality: N/A;   CYSTOSCOPY    . TONSILLECTOMY     Family History  Problem Relation Age of Onset  . Congestive Heart Failure Unknown   . Diabetes Brother    Social History   Social History  . Marital status: Married    Spouse name: N/A  . Number of children: N/A  . Years of education: N/A   Occupational History  . Retired    Social History Main Topics  . Smoking status: Former Smoker    Packs/day: 0.75    Years: 39.00    Types: Cigarettes    Quit date: 03/21/1993  . Smokeless tobacco: Never Used  . Alcohol use No  . Drug use: No  . Sexual activity: Yes   Other Topics Concern  . Not on file   Social History Narrative   Lives with wife in [place], [place].    Interim medical history since last visit reviewed. Allergies and medications reviewed  Review of Systems  Eyes: Negative for visual disturbance.  Cardiovascular: Negative for chest pain.   Per HPI unless specifically indicated above     Objective:    BP 122/64   Pulse 92   Temp 97.8 F (36.6 C) (Oral)   Resp 14   Wt 179 lb 12.8 oz (81.6 kg)   SpO2 98%  BMI 25.08 kg/m   Wt Readings from Last 3 Encounters:  06/08/17 179 lb 12.8 oz (81.6 kg)  02/27/17 177 lb 6.4 oz (80.5 kg)  11/03/16 174 lb 7 oz (79.1 kg)    Physical Exam  Constitutional: He appears well-developed and well-nourished. No distress.  HENT:  Head: Normocephalic and atraumatic.  Eyes: EOM are normal. No scleral icterus.  Neck: No thyromegaly present.  Cardiovascular: Normal rate and regular rhythm.   Murmur heard.  Systolic murmur is present with a grade of 2/6  Pulmonary/Chest: Effort normal and breath sounds normal.  Abdominal: Soft. Bowel sounds are normal. He exhibits no distension.  Musculoskeletal: He exhibits no edema.  Neurological: Coordination normal.  Skin: Skin is warm and dry. No pallor.  Psychiatric: He has a normal mood and affect. His behavior is normal. Judgment and thought content normal. His mood appears not anxious.    Diabetic Foot Form - Detailed   Diabetic Foot Exam - detailed Diabetic Foot exam was performed with the following findings:  Yes 06/08/2017  8:25 AM  Visual Foot Exam completed.:  Yes  Pulse Foot Exam completed.:  Yes  Right Dorsalis Pedis:  Present Left Dorsalis Pedis:  Present  Sensory Foot Exam Completed.:  Yes Semmes-Weinstein Monofilament Test R Site 1-Great Toe:  Pos L Site 1-Great Toe:  Pos        Results for orders placed or performed in visit on 02/27/17  Hemoglobin A1c  Result Value Ref Range   Hgb A1c MFr Bld 9.1 (H) <5.7 %   Mean Plasma Glucose 214 mg/dL  Lipid panel  Result Value Ref Range   Cholesterol 193 <200 mg/dL   Triglycerides 83 <150 mg/dL   HDL 63 >40 mg/dL   Total CHOL/HDL Ratio 3.1 <5.0 Ratio   VLDL 17 <30 mg/dL   LDL Cholesterol 113 (H) <100 mg/dL  Hepatic function panel  Result Value Ref Range   Total Bilirubin 0.4 0.2 - 1.2 mg/dL   Bilirubin, Direct 0.1 <=0.2 mg/dL   Indirect Bilirubin 0.3 0.2 - 1.2 mg/dL   Alkaline Phosphatase 50 40 - 115 U/L   AST 17 10 - 35 U/L   ALT 10 9 - 46 U/L   Total Protein 7.0 6.1 - 8.1 g/dL   Albumin 4.0 3.6 - 5.1 g/dL      Assessment & Plan:   Problem List Items Addressed This Visit      Cardiovascular and Mediastinum   Hypertension goal BP (blood pressure) < 140/90 (Chronic)    Well-controlled      Aortic stenosis, mild (Chronic)    Murmur is a little louder today; encouraged him to f/u with cardiologist        Endocrine   Type 2 diabetes, controlled, with renal manifestation ([place]) - Primary (Chronic)    Patient will check with pharmacist for less expensive insulin; continue the other      Relevant Medications   linagliptin (TRADJENTA) 5 MG TABS tablet   Other Relevant Orders   Hemoglobin A1c     Genitourinary   Chronic kidney disease (CKD), stage III (moderate) (HCC) (Chronic)    Continue to f/u with nephrologist; avoid NSAIDs      Anemia of chronic renal failure (Chronic)    Checked by  nephrologist        Other   Hyperlipidemia LDL goal <100 (Chronic)    Check fasting lipids today, off of statin for about 2 or more months; try to limit saturated fats  Relevant Orders   Lipid panel   Encounter for medication monitoring    Check liver and kidneys      Relevant Orders   COMPLETE METABOLIC PANEL WITH GFR   Diastolic dysfunction without heart failure    Noted in cardiologist's documentation; no signs today of fluid overload       Other Visit Diagnoses    Needs flu shot       Relevant Orders   Flu vaccine HIGH DOSE PF (Fluzone High dose)       Follow up plan: Return in about 6 months (around 12/07/2017) for twenty minute follow-up with fasting labs.  An after-visit summary was printed and given to the patient at [place].  Please see the patient instructions which may contain other information and recommendations beyond what is mentioned above in the assessment and plan.  Meds ordered this encounter  Medications  . linagliptin (TRADJENTA) 5 MG TABS tablet    Sig: Take 5 mg by mouth daily.    Orders Placed This Encounter  Procedures  . Flu vaccine HIGH DOSE PF (Fluzone High dose)  . Lipid panel  . Hemoglobin A1c  . COMPLETE METABOLIC PANEL WITH GFR   Medications Discontinued During This Encounter  Medication Reason  . sitaGLIPtin (JANUVIA) 50 MG tablet Discontinued by provider

## 2017-06-08 NOTE — Assessment & Plan Note (Signed)
Checked by nephrologist

## 2017-06-08 NOTE — Assessment & Plan Note (Signed)
Check fasting lipids today, off of statin for about 2 or more months; try to limit saturated fats

## 2017-06-08 NOTE — Assessment & Plan Note (Signed)
Continue to f/u with nephrologist; avoid NSAIDs

## 2017-06-08 NOTE — Telephone Encounter (Signed)
Let's see what his labs show first and decide if he still needs to be on something in this class He might be better off on another agent, so we'll discuss after labs are back

## 2017-06-08 NOTE — Assessment & Plan Note (Signed)
Check liver and kidneys 

## 2017-06-09 LAB — COMPLETE METABOLIC PANEL WITH GFR
AG RATIO: 1.4 (calc) (ref 1.0–2.5)
ALBUMIN MSPROF: 4.1 g/dL (ref 3.6–5.1)
ALT: 10 U/L (ref 9–46)
AST: 18 U/L (ref 10–35)
Alkaline phosphatase (APISO): 46 U/L (ref 40–115)
BILIRUBIN TOTAL: 0.5 mg/dL (ref 0.2–1.2)
BUN / CREAT RATIO: 15 (calc) (ref 6–22)
BUN: 19 mg/dL (ref 7–25)
CHLORIDE: 106 mmol/L (ref 98–110)
CO2: 27 mmol/L (ref 20–32)
Calcium: 9.4 mg/dL (ref 8.6–10.3)
Creat: 1.25 mg/dL — ABNORMAL HIGH (ref 0.70–1.11)
GFR, EST AFRICAN AMERICAN: 62 mL/min/{1.73_m2} (ref >=60)
GFR, Est Non African American: 54 mL/min/{1.73_m2} — ABNORMAL LOW (ref >=60)
Globulin: 3 g/dL (calc) (ref 1.9–3.7)
Glucose, Bld: 139 mg/dL — ABNORMAL HIGH (ref 65–99)
POTASSIUM: 4.1 mmol/L (ref 3.5–5.3)
Sodium: 139 mmol/L (ref 135–146)
TOTAL PROTEIN: 7.1 g/dL (ref 6.1–8.1)

## 2017-06-09 LAB — LIPID PANEL
Cholesterol: 229 mg/dL — ABNORMAL HIGH (ref <200)
HDL: 66 mg/dL (ref >40)
LDL CHOLESTEROL (CALC): 145 mg/dL — AB
NON-HDL CHOLESTEROL (CALC): 163 mg/dL — AB (ref <130)
TRIGLYCERIDES: 74 mg/dL (ref <150)
Total CHOL/HDL Ratio: 3.5 (calc) (ref <5.0)

## 2017-06-09 LAB — HEMOGLOBIN A1C
EAG (MMOL/L): 11.1 (calc)
HEMOGLOBIN A1C: 8.6 %{Hb} — AB (ref <5.7)
MEAN PLASMA GLUCOSE: 200 (calc)

## 2017-06-10 ENCOUNTER — Other Ambulatory Visit: Payer: Self-pay | Admitting: Family Medicine

## 2017-06-10 ENCOUNTER — Telehealth: Payer: Self-pay

## 2017-06-10 MED ORDER — SITAGLIPTIN PHOSPHATE 50 MG PO TABS: 50.0000 mg | ORAL_TABLET | Freq: Every day | ORAL | 5 refills | Status: DC

## 2017-06-10 NOTE — Progress Notes (Signed)
Let's try the crestor 4 nights a week, skip M-W-F Switch from tradjenta to [place] Call me with any problems Stop meds if needed and call Return in 3 months; morning appt requested by patient ---------------------------------------------

## 2017-06-10 NOTE — Telephone Encounter (Signed)
Called pt per Dr.[name]'s request to get him scheduled for 3 month f/u no answer. LM for pt to call back and schedule.

## 2017-06-17 LAB — HM DIABETES EYE EXAM

## 2017-07-10 ENCOUNTER — Telehealth: Payer: Self-pay

## 2017-07-10 NOTE — Telephone Encounter (Signed)
Called pt. No answer. LM for pt informing him that he should not be taking tradjenta any longer only Januvia. Advised pt to call back to confirm and clarify. CRM created.

## 2017-07-15 ENCOUNTER — Telehealth: Payer: Self-pay

## 2017-07-15 NOTE — Telephone Encounter (Signed)
Called pt. No answer. Lm informing pt again that he should no longer be taking Tradjenta, only [place] now. Advised pt to call back to voice understanding. CRM created.

## 2017-07-20 ENCOUNTER — Telehealth: Payer: Self-pay

## 2017-07-20 NOTE — Telephone Encounter (Signed)
Copied from [place] [id number]. Topic: Quick Communication - See Telephone Encounter >> Jul 20, 2017 10:15 AM Cl[place], Laban Emperor wrote: Pt wanted to the Dr. Gwyndolyn Kaufman that he is now taking the [place].

## 2017-08-21 ENCOUNTER — Other Ambulatory Visit: Payer: Self-pay | Admitting: Family Medicine

## 2017-09-07 ENCOUNTER — Other Ambulatory Visit: Payer: Self-pay

## 2017-09-07 ENCOUNTER — Other Ambulatory Visit: Payer: Self-pay | Admitting: Family Medicine

## 2017-09-07 MED ORDER — SILDENAFIL CITRATE 100 MG PO TABS: 50.0000 mg | ORAL_TABLET | Freq: Every day | ORAL | 5 refills | Status: DC | PRN

## 2017-09-07 NOTE — Telephone Encounter (Signed)
Reviewed last Cr and K+ Rx approved 

## 2017-09-07 NOTE — Telephone Encounter (Signed)
Last RX was written 09/11/2014 Last Dispensed 04/09/2015

## 2017-09-10 ENCOUNTER — Encounter: Payer: Self-pay | Admitting: Family Medicine

## 2017-09-10 ENCOUNTER — Ambulatory Visit: Payer: Medicare Other | Admitting: Family Medicine

## 2017-09-10 DIAGNOSIS — N2581 Secondary hyperparathyroidism of renal origin: Secondary | ICD-10-CM

## 2017-09-10 DIAGNOSIS — N183 Chronic kidney disease, stage 3 unspecified: Secondary | ICD-10-CM

## 2017-09-10 DIAGNOSIS — Z5181 Encounter for therapeutic drug level monitoring: Secondary | ICD-10-CM | POA: Diagnosis not present

## 2017-09-10 DIAGNOSIS — D631 Anemia in chronic kidney disease: Secondary | ICD-10-CM

## 2017-09-10 DIAGNOSIS — N529 Male erectile dysfunction, unspecified: Secondary | ICD-10-CM | POA: Diagnosis not present

## 2017-09-10 DIAGNOSIS — I10 Essential (primary) hypertension: Secondary | ICD-10-CM

## 2017-09-10 DIAGNOSIS — E785 Hyperlipidemia, unspecified: Secondary | ICD-10-CM

## 2017-09-10 DIAGNOSIS — E1122 Type 2 diabetes mellitus with diabetic chronic kidney disease: Secondary | ICD-10-CM | POA: Diagnosis not present

## 2017-09-10 LAB — GLUCOSE, POCT (MANUAL RESULT ENTRY): POC GLUCOSE: 151 mg/dL — AB (ref 70–99)

## 2017-09-10 NOTE — Assessment & Plan Note (Signed)
Well controlled today.

## 2017-09-10 NOTE — Patient Instructions (Signed)
Request eye exam report please Please do see your eye doctor regularly, and have your eyes examined every year (or more often per his or her recommendation) Check your feet every night and let me know right away of any sores, infections, numbness, etc. Try to limit sweets, white bread, white rice, white potatoes

## 2017-09-10 NOTE — Assessment & Plan Note (Signed)
Check lipids, limit saturated fats 

## 2017-09-10 NOTE — Assessment & Plan Note (Signed)
Monitored by renal doctor

## 2017-09-10 NOTE — Assessment & Plan Note (Signed)
50 mg working well, refills just sent

## 2017-09-10 NOTE — Assessment & Plan Note (Signed)
Check a1c today; continue to monitor FSBS 3x a day; foot exam UTD; eye exam UTD

## 2017-09-10 NOTE — Progress Notes (Signed)
BP 130/85   Pulse 95   Temp 97.9 F (36.6 C) (Oral)   Resp 16   Wt 177 lb 14.4 oz (80.7 kg)   SpO2 98%   BMI 24.81 kg/m    Subjective:    Patient ID: Brian [name], male    DOB: 1934/01/09, 84 y.o.   MRN: [id number]  HPI: Brian [name] is a 84 y.o. male  Chief Complaint  Patient presents with  . Follow-up  . Diabetes    pt states [place] gives him rash    HPI Patient is here for f/u Type 2 diabetes; the Januvia gave him a rash; concerned about the cost of medicine; checks FSBS every day; wondering about the machine; he checks sugars 3x a day sometimes; using Levemir at 25 units, and he'll go up or down; if it's too low, he'll adjust the dose, raises it up if it's high; monitoring closely; none over 200, none under 60  Lab Results  Component Value Date   HGBA1C 8.6 (H) 06/08/2017    High cholesterol; on statin; no bacon, not much cheese; lots of peanut butter; rarely eats eggs; morning meal is raisin bran and banana Lab Results  Component Value Date   CHOL 229 (H) 06/08/2017   HDL 66 06/08/2017   LDLCALC 113 (H) 02/27/2017   TRIG 74 06/08/2017   CHOLHDL 3.5 06/08/2017   CKD; sees his kidney doctor next week  Depression screen [facility] 2/9 09/10/2017 06/08/2017 02/27/2017 11/03/2016 05/05/2016  Decreased Interest 0 0 0 0 0  Down, Depressed, Hopeless 0 0 0 0 0  PHQ - 2 Score 0 0 0 0 0    Relevant past medical, surgical, family and social history reviewed Past Medical History:  Diagnosis Date  . Anemia of chronic renal failure 05/08/2015  . Aortic stenosis, mild 09/21/2015  . Arthritis   . Bladder stone   . Chronic kidney disease (CKD), stage III (moderate) ([place]) 05/08/2015   Followed by Dr. Rolly Salter   . Diastolic dysfunction without heart failure 06/08/2017  . History of bladder stone   . Hyperlipidemia   . Hypertension   . Nocturia   . Renal disorder   . Type 2 diabetes mellitus ([place])    Past Surgical History:  Procedure Laterality Date  .  CYSTOSCOPY/RETROGRADE/URETEROSCOPY/STONE EXTRACTION WITH BASKET N/A 03/22/2013   Procedure: CYSTOSCOPY BLADDER STONE STONE EXTRACTION;  Surgeon: [name], MD;  Location: [facility];  Service: Urology;  Laterality: N/A;  CYSTOSCOPY    . TONSILLECTOMY     Family History  Problem Relation Age of Onset  . Congestive Heart Failure Unknown   . Diabetes Brother    Social History   Tobacco Use  . Smoking status: Former Smoker    Packs/day: 0.75    Years: 39.00    Pack years: 29.25    Types: Cigarettes    Last attempt to quit: 03/21/1993    Years since quitting: 24.4  . Smokeless tobacco: Never Used  Substance Use Topics  . Alcohol use: No    Alcohol/week: 0.0 oz  . Drug use: No    Interim medical history since last visit reviewed. Allergies and medications reviewed  Review of Systems Per HPI unless specifically indicated above     Objective:    BP 130/85   Pulse 95   Temp 97.9 F (36.6 C) (Oral)   Resp 16   Wt 177 lb 14.4 oz (80.7 kg)   SpO2 98%   BMI 24.81 kg/m  Wt Readings from Last 3 Encounters:  09/10/17 177 lb 14.4 oz (80.7 kg)  06/08/17 179 lb 12.8 oz (81.6 kg)  02/27/17 177 lb 6.4 oz (80.5 kg)    Physical Exam  Constitutional: He appears well-developed and well-nourished. No distress.  HENT:  Head: Normocephalic and atraumatic.  Eyes: EOM are normal. No scleral icterus.  Neck: No thyromegaly present.  Cardiovascular: Normal rate and regular rhythm.  Murmur heard.  Systolic murmur is present with a grade of 2/6. Pulmonary/Chest: Effort normal and breath sounds normal.  Abdominal: Soft. Bowel sounds are normal. He exhibits no distension.  Musculoskeletal: He exhibits no edema.  Neurological: Coordination normal.  Skin: Skin is warm and dry. No pallor.  Psychiatric: He has a normal mood and affect. His behavior is normal. Judgment and thought content normal. His mood appears not anxious.   Diabetic Foot Form - Detailed   Diabetic  Foot Exam - detailed Diabetic Foot exam was performed with the following findings:  Yes 09/10/2017  8:15 AM  Visual Foot Exam completed.:  Yes  Pulse Foot Exam completed.:  Yes  Right Dorsalis Pedis:  Present Left Dorsalis Pedis:  Present  Sensory Foot Exam Completed.:  Yes Semmes-Weinstein Monofilament Test R Site 1-Great Toe:  Pos L Site 1-Great Toe:  Pos        Results for orders placed or performed in visit on 06/18/17  HM DIABETES EYE EXAM  Result Value Ref Range   HM Diabetic Eye Exam No Retinopathy No Retinopathy      Assessment & Plan:   Problem List Items Addressed This Visit      Cardiovascular and Mediastinum   Hypertension goal BP (blood pressure) < 140/90 (Chronic)    Well-controlled today        Endocrine   Type 2 diabetes, controlled, with renal manifestation ([facility]) (Chronic)    Check a1c today; continue to monitor FSBS 3x a day; foot exam UTD; eye exam UTD      Relevant Orders   Basic metabolic panel   Hemoglobin A1c   Lipid panel   Secondary hyperparathyroidism of renal origin ([place]) (Chronic)    Monitored by kidney doctor        Genitourinary   Erectile dysfunction    50 mg working well, refills just sent      Chronic kidney disease (CKD), stage III (moderate) (H[facility]) (Chronic)    Avoiding NSAIDs; stay hydrated      Anemia of chronic renal failure (Chronic)    Monitored by renal doctor        Other   Hyperlipidemia LDL goal <100 (Chronic)    Check lipids, limit saturated fats      Relevant Orders   Lipid panel   Encounter for medication monitoring    Last liver function normal          Follow up plan: No Follow-up on file.  An after-visit summary was printed and given to the patient at [place].  Please see the patient instructions which may contain other information and recommendations beyond what is mentioned above in the assessment and plan.  No orders of the defined types were placed in this encounter.   Orders Placed  This Encounter  Procedures  . Basic metabolic panel  . Hemoglobin A1c  . Lipid panel

## 2017-09-10 NOTE — Assessment & Plan Note (Signed)
Last liver function normal

## 2017-09-10 NOTE — Assessment & Plan Note (Signed)
Monitored by kidney doctor 

## 2017-09-10 NOTE — Assessment & Plan Note (Signed)
Avoiding NSAIDs; stay hydrated

## 2017-09-11 LAB — LIPID PANEL
Cholesterol: 237 mg/dL — ABNORMAL HIGH (ref <200)
HDL: 65 mg/dL (ref >40)
LDL Cholesterol (Calc): 152 mg/dL (calc) — ABNORMAL HIGH
Non-HDL Cholesterol (Calc): 172 mg/dL (calc) — ABNORMAL HIGH (ref <130)
TRIGLYCERIDES: 92 mg/dL (ref <150)
Total CHOL/HDL Ratio: 3.6 (calc) (ref <5.0)

## 2017-09-11 LAB — BASIC METABOLIC PANEL
BUN/Creatinine Ratio: 14 (calc) (ref 6–22)
BUN: 19 mg/dL (ref 7–25)
CALCIUM: 9.4 mg/dL (ref 8.6–10.3)
CO2: 24 mmol/L (ref 20–32)
Chloride: 105 mmol/L (ref 98–110)
Creat: 1.35 mg/dL — ABNORMAL HIGH (ref 0.70–1.11)
GLUCOSE: 139 mg/dL — AB (ref 65–99)
Potassium: 4.3 mmol/L (ref 3.5–5.3)
SODIUM: 139 mmol/L (ref 135–146)

## 2017-09-11 LAB — HEMOGLOBIN A1C
EAG (MMOL/L): 12.7 (calc)
Hgb A1c MFr Bld: 9.6 % of total Hgb — ABNORMAL HIGH (ref <5.7)
MEAN PLASMA GLUCOSE: 229 (calc)

## 2017-09-15 ENCOUNTER — Other Ambulatory Visit: Payer: Self-pay | Admitting: Family Medicine

## 2017-09-15 MED ORDER — ROSUVASTATIN CALCIUM 5 MG PO TABS: 5.0000 mg | ORAL_TABLET | Freq: Every day | ORAL | 1 refills | Status: DC

## 2017-09-15 NOTE — Progress Notes (Signed)
Increase statin; recheck lipids in 6 weeks Increase insulin by 3 units every 3 days x 2, then patient to call in one week with FSBS Adjust insulin further if needed

## 2017-09-24 ENCOUNTER — Other Ambulatory Visit: Payer: Self-pay | Admitting: Family Medicine

## 2017-09-24 MED ORDER — FINASTERIDE 5 MG PO TABS: ORAL_TABLET | ORAL | 11 refills | Status: DC

## 2017-09-24 NOTE — Telephone Encounter (Signed)
Copied from [place] [id number]. Topic: Quick Communication - See Telephone Encounter >> Sep 24, 2017  1:37 PM Vernona Rieger wrote: CRM for notification. See Telephone encounter for:   09/24/17.   Patient needs a refill on finasteride (PROSCAR) 5 MG tablet. The pharmacy he was using went out of business & [facility] [address] advised [facility] he needed to have the office send a brand new script over so they can have it on file.  Call back [phone]

## 2017-09-24 NOTE — Telephone Encounter (Signed)
sent 

## 2017-09-24 NOTE — Telephone Encounter (Signed)
[facility] pharmacy [facility] closed.

## 2017-09-28 IMAGING — CR DG CHEST 1V PORT
1 series · 1 of 1 positions shown · non-contrast
Comparison: 07/18/2015

CLINICAL DATA: Wheezing, shortness of breath, cough, history of
CHF.

EXAM:
PORTABLE CHEST 1 VIEW

[AP]
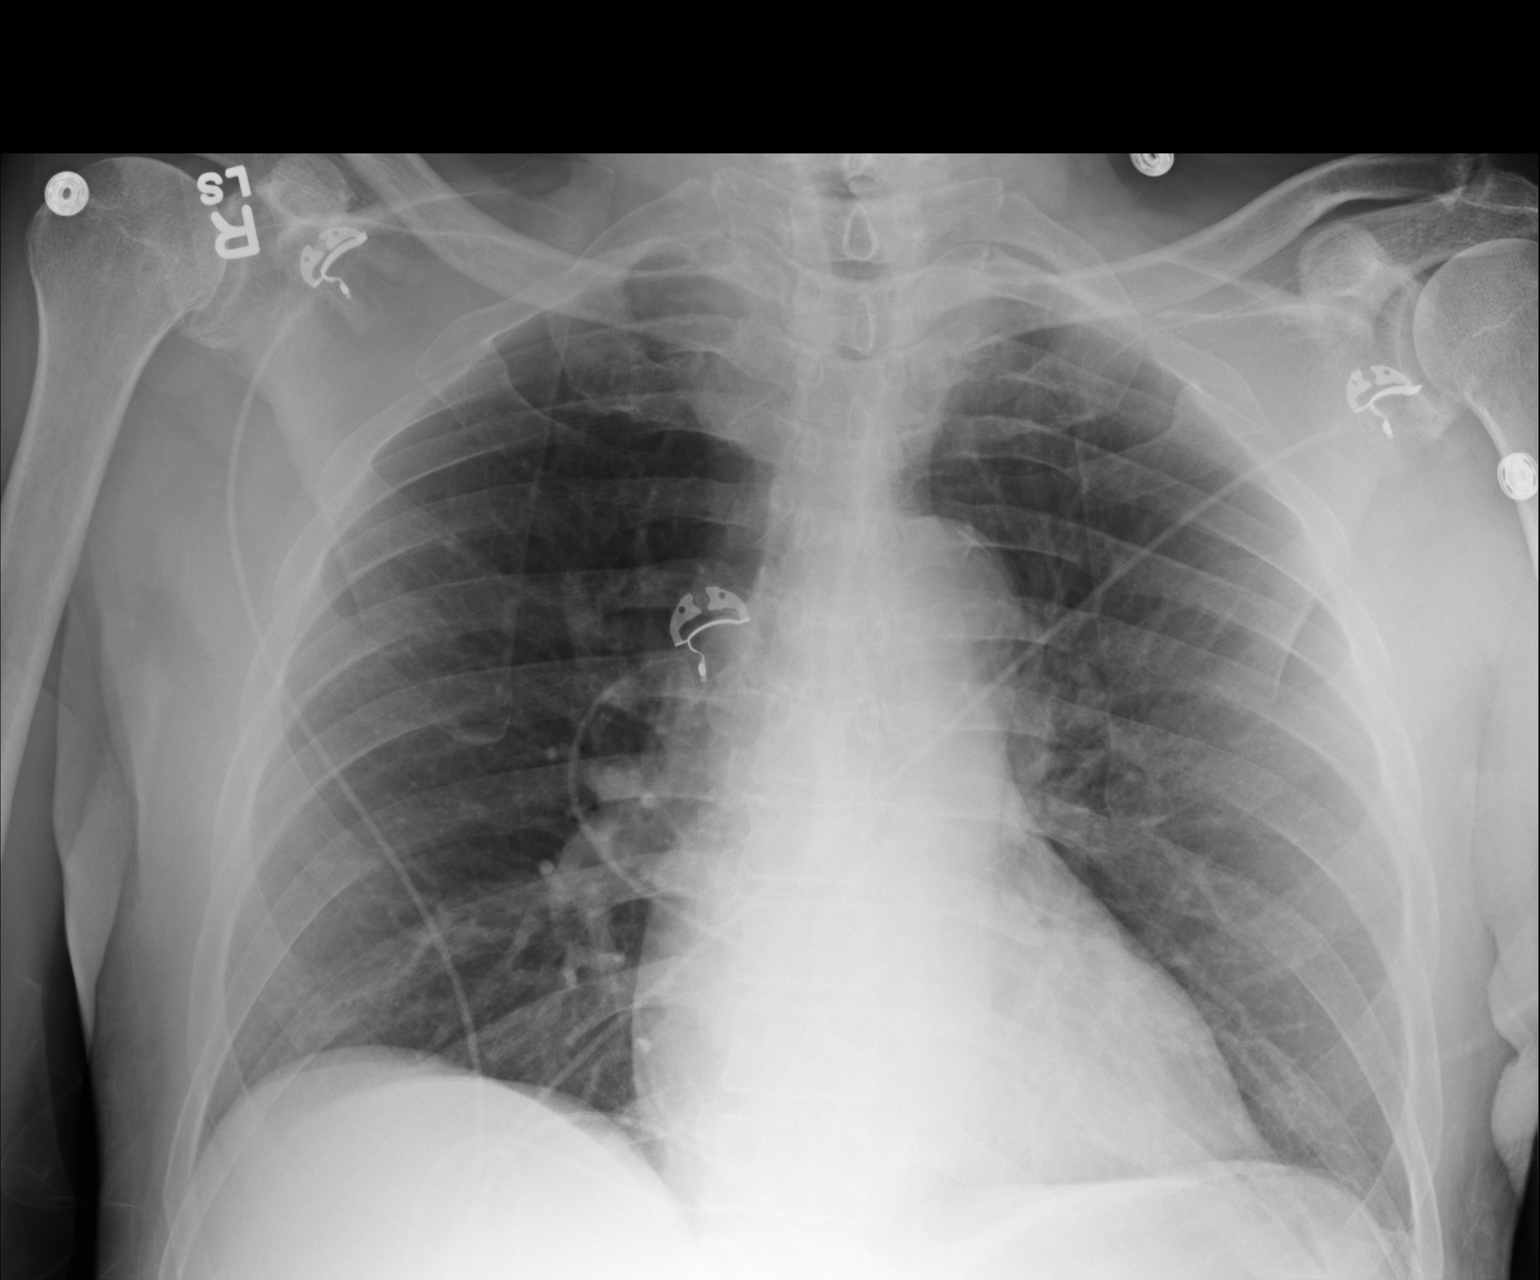

[1 of 1 positions shown; findings below may reference images not displayed]

FINDINGS: Slightly shallow inspiration. Normal heart size and pulmonary
vascularity. Calcification of the aorta. Mediastinal contours appear
intact. No focal airspace disease or consolidation in the lungs. No
blunting of costophrenic angles. No pneumothorax. Degenerative
changes in the shoulders.
IMPRESSION: Shallow inspiration.  No evidence of active pulmonary disease.

## 2017-10-02 ENCOUNTER — Telehealth: Payer: Self-pay | Admitting: Family Medicine

## 2017-10-02 ENCOUNTER — Telehealth: Payer: Self-pay

## 2017-10-02 MED ORDER — INSULIN PEN NEEDLE 31G X 5 MM MISC: 3 refills | Status: DC

## 2017-10-02 NOTE — Telephone Encounter (Signed)
Pt.notified

## 2017-10-02 NOTE — Telephone Encounter (Signed)
Patient brought in FSBS readings: 118, 121, 119, 126, 137, 101  Please let him know we appreciate him bringing in his sugars I'm very pleased with his progress and we'll continue the meds as he is doing Pharmacy in chart is [place] Can you delete any others if there? Thank you

## 2017-10-02 NOTE — Telephone Encounter (Signed)
[id number] [address], [place] [address]  Phone: [phone] Fax: [phone]   [facility] [place] pharm. Use [facility] [facility].

## 2017-10-10 NOTE — Telephone Encounter (Signed)
Signing off on old note

## 2017-10-10 NOTE — Progress Notes (Signed)
Signing off on old note from 06/18/17

## 2017-10-26 ENCOUNTER — Other Ambulatory Visit: Payer: Self-pay | Admitting: Family Medicine

## 2017-10-26 DIAGNOSIS — I10 Essential (primary) hypertension: Secondary | ICD-10-CM

## 2017-10-27 ENCOUNTER — Other Ambulatory Visit: Payer: Self-pay

## 2017-10-27 DIAGNOSIS — K581 Irritable bowel syndrome with constipation: Secondary | ICD-10-CM

## 2017-10-27 MED ORDER — DOCUSATE SODIUM 100 MG PO CAPS: 100.0000 mg | ORAL_CAPSULE | Freq: Two times a day (BID) | ORAL | 5 refills | Status: AC | PRN

## 2017-10-27 NOTE — Telephone Encounter (Signed)
sent 

## 2017-10-27 NOTE — Telephone Encounter (Signed)
Pt request new rx for stool softener. Please advise .

## 2017-11-04 ENCOUNTER — Encounter: Payer: Self-pay | Admitting: Family Medicine

## 2017-11-04 ENCOUNTER — Ambulatory Visit: Payer: Medicare Other | Admitting: Family Medicine

## 2017-11-04 VITALS — BP 142/78 | HR 88 | Temp 98.2°F | Resp 14 | Wt 178.4 lb

## 2017-11-04 DIAGNOSIS — E1122 Type 2 diabetes mellitus with diabetic chronic kidney disease: Secondary | ICD-10-CM

## 2017-11-04 DIAGNOSIS — E785 Hyperlipidemia, unspecified: Secondary | ICD-10-CM

## 2017-11-04 DIAGNOSIS — I10 Essential (primary) hypertension: Secondary | ICD-10-CM | POA: Diagnosis not present

## 2017-11-04 DIAGNOSIS — I350 Nonrheumatic aortic (valve) stenosis: Secondary | ICD-10-CM | POA: Diagnosis not present

## 2017-11-04 DIAGNOSIS — N183 Chronic kidney disease, stage 3 (moderate): Secondary | ICD-10-CM | POA: Diagnosis not present

## 2017-11-04 DIAGNOSIS — N401 Enlarged prostate with lower urinary tract symptoms: Secondary | ICD-10-CM | POA: Diagnosis not present

## 2017-11-04 DIAGNOSIS — L309 Dermatitis, unspecified: Secondary | ICD-10-CM

## 2017-11-04 DIAGNOSIS — R011 Cardiac murmur, unspecified: Secondary | ICD-10-CM

## 2017-11-04 LAB — LIPID PANEL
CHOL/HDL RATIO: 3.5 (calc) (ref <5.0)
CHOLESTEROL: 205 mg/dL — AB (ref <200)
HDL: 59 mg/dL (ref >40)
LDL CHOLESTEROL (CALC): 129 mg/dL — AB
NON-HDL CHOLESTEROL (CALC): 146 mg/dL — AB (ref <130)
Triglycerides: 74 mg/dL (ref <150)

## 2017-11-04 MED ORDER — LINAGLIPTIN 5 MG PO TABS: 5.0000 mg | ORAL_TABLET | Freq: Every day | ORAL | 5 refills | Status: DC

## 2017-11-04 MED ORDER — INSULIN DETEMIR 100 UNIT/ML FLEXPEN: 30.0000 [IU] | PEN_INJECTOR | Freq: Every day | SUBCUTANEOUS | 6 refills | Status: DC

## 2017-11-04 MED ORDER — NYSTATIN-TRIAMCINOLONE 100000-0.1 UNIT/GM-% EX CREA: TOPICAL_CREAM | CUTANEOUS | 2 refills | Status: DC

## 2017-11-04 MED ORDER — FINASTERIDE 5 MG PO TABS: ORAL_TABLET | ORAL | 11 refills | Status: DC

## 2017-11-04 MED ORDER — QUINAPRIL HCL 20 MG PO TABS: 20.0000 mg | ORAL_TABLET | Freq: Two times a day (BID) | ORAL | 2 refills | Status: DC

## 2017-11-04 NOTE — Assessment & Plan Note (Signed)
Patient requested refills of prostate meds

## 2017-11-04 NOTE — Assessment & Plan Note (Signed)
Continue insulin now at 30 units; return on or after April 18th for A1c and next visit

## 2017-11-04 NOTE — Assessment & Plan Note (Signed)
Refer back to Dr. Oval Linsey in case echo needed

## 2017-11-04 NOTE — Assessment & Plan Note (Addendum)
Try the DASH guidelines; monitor at home and call us if any higher

## 2017-11-04 NOTE — Assessment & Plan Note (Signed)
Check lipids today; goal LDL less than 70 

## 2017-11-04 NOTE — Progress Notes (Signed)
BP (!) 142/78   Pulse 88   Temp 98.2 F (36.8 C) (Oral)   Resp 14   Wt 178 lb 6.4 oz (80.9 kg)   SpO2 95%   BMI 24.88 kg/m    Subjective:    Patient ID: Brian [name], male    DOB: 10-13-1933, 84 y.o.   MRN: [id number]  HPI: [name] is a 84 y.o. male  Chief Complaint  Patient presents with  . Follow-up    HPI Type 2 diabetes Checking sugars  Brought in readings for once daily FSBS 86-158 over the last 13 days Up to 31 units daily of insulin, but decreases it a little at times; 30 units a day will be our numbers Lab Results  Component Value Date   HGBA1C 9.6 (H) 09/10/2017    High cholesterol Taking rosuvastatin four nights a week; tolerating; no muscle aches Trying to avoid fatty meats No breakfast this morning  HTN; taking medicine, but thinks medicine is not the same as our manual readings; normal at home  Patient requests a refill of the [facility] cardiologist about a year ago; aortic stenosis, reviewed her note regarding his murmur  Depression screen [facility] 2/9 11/04/2017 09/10/2017 06/08/2017 02/27/2017 11/03/2016  Decreased Interest 0 0 0 0 0  Down, Depressed, Hopeless 0 0 0 0 0  PHQ - 2 Score 0 0 0 0 0    Relevant past medical, surgical, family and social history reviewed Past Medical History:  Diagnosis Date  . Anemia of chronic renal failure 05/08/2015  . Aortic stenosis, mild 09/21/2015  . Arthritis   . Bladder stone   . Chronic kidney disease (CKD), stage III (moderate) ([place]) 05/08/2015   Followed by Dr. Rolly Salter   . Diastolic dysfunction without heart failure 06/08/2017  . History of bladder stone   . Hyperlipidemia   . Hypertension   . Nocturia   . Renal disorder   . Type 2 diabetes mellitus ([place])    Past Surgical History:  Procedure Laterality Date  . CYSTOSCOPY/RETROGRADE/URETEROSCOPY/STONE EXTRACTION WITH BASKET N/A 03/22/2013   Procedure: CYSTOSCOPY BLADDER STONE STONE EXTRACTION;  Surgeon: [name], MD;  Location:  [place];  Service: Urology;  Laterality: N/A;  CYSTOSCOPY    . TONSILLECTOMY     Family History  Problem Relation Age of Onset  . Congestive Heart Failure Unknown   . Diabetes Brother    Social History   Tobacco Use  . Smoking status: Former Smoker    Packs/day: 0.75    Years: 39.00    Pack years: 29.25    Types: Cigarettes    Last attempt to quit: 03/21/1993    Years since quitting: 24.6  . Smokeless tobacco: Never Used  Substance Use Topics  . Alcohol use: No    Alcohol/week: 0.0 oz  . Drug use: No    Interim medical history since last visit reviewed. Allergies and medications reviewed  Review of Systems Per HPI unless specifically indicated above     Objective:    BP (!) 142/78   Pulse 88   Temp 98.2 F (36.8 C) (Oral)   Resp 14   Wt 178 lb 6.4 oz (80.9 kg)   SpO2 95%   BMI 24.88 kg/m   Wt Readings from Last 3 Encounters:  11/04/17 178 lb 6.4 oz (80.9 kg)  09/10/17 177 lb 14.4 oz (80.7 kg)  06/08/17 179 lb 12.8 oz (81.6 kg)    Physical Exam  Constitutional: He appears  well-developed and well-nourished. No distress.  Eyes: No scleral icterus.  Cardiovascular:  Murmur (III/VI RUSB, II LLSB) heard. Pulmonary/Chest: Effort normal and breath sounds normal. No respiratory distress.  Musculoskeletal: He exhibits no edema.  Neurological: He is alert.  Skin: No rash noted.  Psychiatric: He has a normal mood and affect.    Results for orders placed or performed in visit on 88/41/66  Basic metabolic panel  Result Value Ref Range   Glucose, Bld 139 (H) 65 - 99 mg/dL   BUN 19 7 - 25 mg/dL   Creat 1.35 (H) 0.70 - 1.11 mg/dL   BUN/Creatinine Ratio 14 6 - 22 (calc)   Sodium 139 135 - 146 mmol/L   Potassium 4.3 3.5 - 5.3 mmol/L   Chloride 105 98 - 110 mmol/L   CO2 24 20 - 32 mmol/L   Calcium 9.4 8.6 - 10.3 mg/dL  Hemoglobin A1c  Result Value Ref Range   Hgb A1c MFr Bld 9.6 (H) <5.7 % of total Hgb   Mean Plasma Glucose 229 (calc)   eAG  (mmol/L) 12.7 (calc)  Lipid panel  Result Value Ref Range   Cholesterol 237 (H) <200 mg/dL   HDL 65 >40 mg/dL   Triglycerides 92 <150 mg/dL   LDL Cholesterol (Calc) 152 (H) mg/dL (calc)   Total CHOL/HDL Ratio 3.6 <5.0 (calc)   Non-HDL Cholesterol (Calc) 172 (H) <130 mg/dL (calc)  POCT Glucose (CBG)  Result Value Ref Range   POC Glucose 151 (A) 70 - 99 mg/dl      Assessment & Plan:   Problem List Items Addressed This Visit      Cardiovascular and Mediastinum   Hypertension goal BP (blood pressure) < 140/90 (Chronic)    Try the DASH guidelines; monitor at home and call us if any higher      Relevant Medications   rosuvastatin (CRESTOR) 5 MG tablet   quinapril (ACCUPRIL) 20 MG tablet   Aortic stenosis, mild (Chronic)    Refer back to Dr. Oval Linsey in case echo needed      Relevant Medications   rosuvastatin (CRESTOR) 5 MG tablet   quinapril (ACCUPRIL) 20 MG tablet     Endocrine   Type 2 diabetes, controlled, with renal manifestation (HCC) (Chronic)    Continue insulin now at 30 units; return on or after April 18th for A1c and next visit      Relevant Medications   rosuvastatin (CRESTOR) 5 MG tablet   quinapril (ACCUPRIL) 20 MG tablet   Insulin Detemir (LEVEMIR FLEXTOUCH) 100 UNIT/ML Pen   linagliptin (TRADJENTA) 5 MG TABS tablet     Genitourinary   Benign localized prostatic hyperplasia with lower urinary tract symptoms (LUTS)    Patient requested refills of prostate meds      Relevant Medications   finasteride (PROSCAR) 5 MG tablet     Other   Hyperlipidemia LDL goal <100 - Primary (Chronic)    Check lipids today; goal LDL less than 70      Relevant Medications   rosuvastatin (CRESTOR) 5 MG tablet   quinapril (ACCUPRIL) 20 MG tablet   Other Relevant Orders   Lipid panel   Heart murmur    Advised patient to see cardiologist about his murmur      Relevant Orders   Ambulatory referral to Cardiology    Other Visit Diagnoses    Dermatitis       refill  provided of the cream       Follow up plan: Return in  about 5 weeks (around 12/10/2017) for follow-up visit with Dr. Sanda Klein.  An after-visit summary was printed and given to the patient at [place].  Please see the patient instructions which may contain other information and recommendations beyond what is mentioned above in the assessment and plan.  Meds ordered this encounter  Medications  . DISCONTD: Insulin Detemir (LEVEMIR FLEXTOUCH) 100 UNIT/ML Pen    Sig: Inject 30 Units into the skin daily.    Dispense:  15 mL    Refill:  6    We're actually increase insulin amount; A1c was over 8; thank you! Please delete any old refills  . finasteride (PROSCAR) 5 MG tablet    Sig: TAKE ONE (1) TABLET EACH DAY    Dispense:  30 tablet    Refill:  11  . DISCONTD: Insulin Detemir (LEVEMIR FLEXTOUCH) 100 UNIT/ML Pen    Sig: Inject 30 Units into the skin daily.    Dispense:  15 mL    Refill:  6    We're actually increase insulin amount; A1c was over 8; thank you! Please delete any old refills  . quinapril (ACCUPRIL) 20 MG tablet    Sig: Take 1 tablet (20 mg total) by mouth 2 (two) times daily.    Dispense:  60 tablet    Refill:  2  . Insulin Detemir (LEVEMIR FLEXTOUCH) 100 UNIT/ML Pen    Sig: Inject 30 Units into the skin daily.    Dispense:  15 mL    Refill:  6    We're actually increase insulin amount; A1c was over 8; thank you! Please delete any old refills  . linagliptin (TRADJENTA) 5 MG TABS tablet    Sig: Take 1 tablet (5 mg total) by mouth daily. For diabetes    Dispense:  30 tablet    Refill:  5  . nystatin-triamcinolone (MYCOLOG II) cream    Sig: APPLY TO AFFECTED AREAS 3 TIMES DAILY ASNEEDED    Dispense:  60 g    Refill:  2    Orders Placed This Encounter  Procedures  . Lipid panel  . Ambulatory referral to Cardiology

## 2017-11-04 NOTE — Assessment & Plan Note (Signed)
Advised patient to see cardiologist about his murmur

## 2017-11-04 NOTE — Patient Instructions (Addendum)
Try to limit saturated fats in your diet (bologna, hot dogs, barbeque, cheeseburgers, hamburgers, steak, bacon, sausage, cheese, etc.) and get more fresh fruits, vegetables, and whole grains If you have not heard anything from my staff in a week about any orders/referrals/studies from today, please contact us here to follow-up [phone]  Please see your cardiologist Skeet Latch, MD Attending Physician Cardiology 12/24/2015 End  12/24/15  Phone: [phone]; Fax: [phone]

## 2017-11-09 ENCOUNTER — Telehealth: Payer: Self-pay | Admitting: Family Medicine

## 2017-11-09 NOTE — Telephone Encounter (Signed)
Copied from [place] [phone]. Topic: Quick Communication - Rx Refill/Question >> Nov 09, 2017 10:33 AM Brian [name] wrote: Medication:  rosuvastatin (CRESTOR) 5 MG tablet [[id number]]   Pt wants to speak w/ the pcp or nurse about the dosage of the medication; if he needs to increase the dosage or not, pt states he received a call from a nurse about his bp and wanted to speak w/  someone about it

## 2017-11-09 NOTE — Telephone Encounter (Signed)
Pt notified, is confused.  Discussed again that he will work on diet, and continue current meds.  Per wife and pt

## 2017-12-04 ENCOUNTER — Encounter: Payer: Self-pay | Admitting: Cardiovascular Disease

## 2017-12-04 ENCOUNTER — Ambulatory Visit (INDEPENDENT_AMBULATORY_CARE_PROVIDER_SITE_OTHER): Payer: Medicare Other | Admitting: Cardiovascular Disease

## 2017-12-04 VITALS — BP 134/70 | HR 82 | Ht 70.0 in | Wt 179.0 lb

## 2017-12-04 DIAGNOSIS — I350 Nonrheumatic aortic (valve) stenosis: Secondary | ICD-10-CM | POA: Diagnosis not present

## 2017-12-04 DIAGNOSIS — I10 Essential (primary) hypertension: Secondary | ICD-10-CM

## 2017-12-04 DIAGNOSIS — E7800 Pure hypercholesterolemia, unspecified: Secondary | ICD-10-CM

## 2017-12-04 MED ORDER — FISH OIL 1000 MG PO CAPS: 1000.0000 mg | ORAL_CAPSULE | Freq: Every day | ORAL | 3 refills | Status: DC

## 2017-12-04 NOTE — Addendum Note (Signed)
Addended by: Alvina Filbert B on: 12/04/2017 04:05 PM   Modules accepted: Orders

## 2017-12-04 NOTE — Patient Instructions (Signed)
Medication Instructions:  Start: Fish Oil 1,000 mg daily    Testing/Procedures: Your physician has requested that you have an echocardiogram. Echocardiography is a painless test that uses sound waves to create images of your heart. It provides your doctor with information about the size and shape of your heart and how well your heart's chambers and valves are working. This procedure takes approximately one hour. There are no restrictions for this procedure. [place] [address]   Follow-Up: Your physician wants you to follow-up in: 6 month with Dr. Oval Linsey. You will receive a reminder letter in the mail two months in advance. If you don't receive a letter, please call our office to schedule the follow-up appointment.   Any Other Special Instructions Will Be Listed Below (If Applicable).     If you need a refill on your cardiac medications before your next appointment, please call your pharmacy.

## 2017-12-04 NOTE — Progress Notes (Signed)
Cardiology Office Note   Date:  12/04/2017   ID:  [name], DOB 1934-02-11, MRN [id number]  PCP:  Arnetha Courser, MD  Cardiologist:   Skeet Latch, MD   No chief complaint on file.   Patient ID: Brian [name] is a 84 y.o. male with mild aortic stenosis, hypertension, diabetes mellitus, CKD 3, and hyperlipidemia who presents for follow up.  History of Present Illness: Brian [name] was admitted to the hospital 06/2015 with liver abscesses and sepsis. During that hospitalization he had demand ischemia with a peak troponin of 1.08.  EKG was negative for ischemia and his echo revealed normal systolic function, grade 1 diastolic dysfunction and mild aortic stenosis.  He followed up with cardiology following that hospitalization and was doing well.  He underwent exercise Cardiolite on 09/2015 that was negative for ischemia. He has been intolerant to statins in the past and was tried on pravastatin. At a follow-up appointment with his primary care physician who is noted to be bradycardic to 45 bpm. Carvedilol was held and he remained asymptomatic.  Since his last appointment Brian [name] has been feeling well.  He hasn't noted any chest pain or shortness of breath.  He doesn't get much formal exercise but does a lot of yard work.  He hasn't noted any lower extremity edema, orthopnea or PND.  He denies lower extremity edema, orthopnea, or PND.  He notes that his blood glucose has been well-controlled lately.  He has not tolerated high-dose statins.  He currently takes rosuvastatin 4 days/week.  He has mild cramping in his hands but does not get cramping in his legs with this.  When he tried higher doses he was unable to tolerate it due to myalgias.  He denies lightheadedness, dizziness, or palpitations.   Past Medical History:  Diagnosis Date  . Anemia of chronic renal failure 05/08/2015  . Aortic stenosis, mild 09/21/2015  . Arthritis   . Bladder stone   . Chronic kidney disease  (CKD), stage III (moderate) ([place]) 05/08/2015   Followed by Dr. Rolly Salter   . Diastolic dysfunction without heart failure 06/08/2017  . History of bladder stone   . Hyperlipidemia   . Hypertension   . Nocturia   . Renal disorder   . Type 2 diabetes mellitus ([place])     Past Surgical History:  Procedure Laterality Date  . CYSTOSCOPY/RETROGRADE/URETEROSCOPY/STONE EXTRACTION WITH BASKET N/A 03/22/2013   Procedure: CYSTOSCOPY BLADDER STONE STONE EXTRACTION;  Surgeon: [name], MD;  Location: [place];  Service: Urology;  Laterality: N/A;  CYSTOSCOPY    . TONSILLECTOMY       Current Outpatient Medications  Medication Sig Dispense Refill  . amLODipine (NORVASC) 10 MG tablet Take 1 tablet (10 mg total) by mouth daily. (for high blood pressure) 30 tablet 11  . aspirin EC 81 MG tablet Take 81 mg by mouth daily. Reported on 09/17/2015    . Blood Glucose Monitoring Suppl (ONE TOUCH ULTRA 2) W/DEVICE KIT     . docusate sodium (DOK) 100 MG capsule Take 1 capsule (100 mg total) by mouth 2 (two) times daily as needed. 100 capsule 5  . finasteride (PROSCAR) 5 MG tablet TAKE ONE (1) TABLET EACH DAY 30 tablet 11  . glucose blood (ONE TOUCH ULTRA TEST) test strip CHECK FINGERSTICK BLOOD SUGAR LEVEL THREE TIMES A DAY AS DIRECTED; E11.29; LON 99 months 100 each 11  . Insulin Detemir (LEVEMIR FLEXTOUCH) 100 UNIT/ML Pen Inject 30 Units into the  skin daily. 15 mL 6  . Insulin Pen Needle (B-D UF III MINI PEN NEEDLES) 31G X 5 MM MISC USE AS DIRECTED WITH LEVEMIR INSULIN PENS AT BEDTIME, once a day 100 each 3  . linagliptin (TRADJENTA) 5 MG TABS tablet Take 1 tablet (5 mg total) by mouth daily. For diabetes 30 tablet 5  . nystatin-triamcinolone (MYCOLOG II) cream APPLY TO AFFECTED AREAS 3 TIMES DAILY ASNEEDED 60 g 2  . quinapril (ACCUPRIL) 20 MG tablet Take 1 tablet (20 mg total) by mouth 2 (two) times daily. 60 tablet 2  . rosuvastatin (CRESTOR) 5 MG tablet Take 1 tablet (5 mg total) by  mouth at bedtime. Four nights a week    . sildenafil (VIAGRA) 100 MG tablet Take 0.5-1 tablets (50-100 mg total) by mouth daily as needed for erectile dysfunction. 5 tablet 5  . tamsulosin (FLOMAX) 0.4 MG CAPS capsule TAKE ONE CAPSULE BY MOUTH DAILY 90 capsule 3  . Omega-3 Fatty Acids (FISH OIL) 1000 MG CAPS Take 1 capsule (1,000 mg total) by mouth daily. 90 capsule 3   No current facility-administered medications for this visit.     Allergies:   Carvedilol; Niaspan [niacin er]; Shellfish allergy; Statins; Sulfa antibiotics; Zetia [ezetimibe]; and Januvia [sitagliptin]    Social History:  The patient  reports that he quit smoking about 24 years ago. His smoking use included cigarettes. He has a 29.25 pack-year smoking history. He has never used smokeless tobacco. He reports that he does not drink alcohol or use drugs.   Family History:  The patient's family history includes Congestive Heart Failure in his unknown relative; Diabetes in his brother.    ROS:  Please see the history of present illness.   Otherwise, review of systems are positive for weight loss.   All other systems are reviewed and negative.    PHYSICAL EXAM: VS:  BP 134/70 (BP Location: Left Arm, Patient Position: Sitting, Cuff Size: Normal)   Pulse 82   Ht '5\' 10"'$  (1.778 m)   Wt 179 lb (81.2 kg)   BMI 25.68 kg/m  , BMI Body mass index is 25.68 kg/m. GENERAL:  Well appearing HEENT: Pupils equal round and reactive, fundi not visualized, oral mucosa unremarkable NECK:  No jugular venous distention, waveform within normal limits, carotid upstroke brisk and symmetric, no bruits, no thyromegaly LYMPHATICS:  No cervical adenopathy LUNGS:  Clear to auscultation bilaterally HEART:  RRR.  PMI not displaced or sustained,S1 and S2 within normal limits, no S3, no S4, no clicks, no rubs, III/VI mid-peaking systolic murmur at the LUSB.  ABD:  Flat, positive bowel sounds normal in frequency in pitch, no bruits, no rebound, no guarding,  no midline pulsatile mass, no hepatomegaly, no splenomegaly EXT:  2 plus pulses throughout, no edema, no cyanosis no clubbing SKIN:  No rashes no nodules NEURO:  Cranial nerves II through XII grossly intact, motor grossly intact throughout PSYCH:  Cognitively intact, oriented to person place and time   EKG:  EKG is ordered today. 12/04/17: Sinus rhythm.  Rate 82 bmp.    Exercise Cardiolite 09/28/15:  Nuclear stress EF: 51%.  The left ventricular ejection fraction is mildly decreased (45-54%).  There was no ST segment deviation noted during stress.  The study is normal.  Normal stress nuclear study with no ischemia or infarction; low normal LV function with EF 51 and normal wall motion.  Recent Labs: 06/08/2017: ALT 10 09/10/2017: BUN 19; Creat 1.35; Potassium 4.3; Sodium 139    Lipid Panel  Component Value Date/Time   CHOL 205 (H) 11/04/2017 0824   TRIG 74 11/04/2017 0824   HDL 59 11/04/2017 0824   CHOLHDL 3.5 11/04/2017 0824   VLDL 17 02/27/2017 0844   LDLCALC 129 (H) 11/04/2017 0824      Wt Readings from Last 3 Encounters:  12/04/17 179 lb (81.2 kg)  11/04/17 178 lb 6.4 oz (80.9 kg)  09/10/17 177 lb 14.4 oz (80.7 kg)      ASSESSMENT AND PLAN:  # Bradycardia: Resolved after stopping carvedilol  # Hypertension:  Blood pressure is better controlled.  Continue amlodipine and quinapril.  # Hyperlipidemia: LDL is not at goal.  He is unable to tolerate higher doses.  He does not have any known ASCVD.  He has also failed ezetimibe.  We will ask him to add fish oil 1000 mg daily.  I also discussed the importance of increasing his exercise.  # Moderate aortic stenosis: Asymptomatic.  Mean gradient was 15 mmHg 06/2015.  It sounds as though he has moderate stenosis by exam.  We will repeat his echocardiogram.   Current medicines are reviewed at length with the patient today.  The patient does not have concerns regarding medicines.  The following changes have been made:  none Labs/ tests ordered today include:   Orders Placed This Encounter  Procedures  . ECHOCARDIOGRAM COMPLETE     Disposition:   FU with [place] C. Oval Linsey, MD, [facility] in 6 months.     Signed, Deanthony Maull C. Oval Linsey, MD, [facility]  12/04/2017 1:11 PM    Export

## 2017-12-08 ENCOUNTER — Encounter: Payer: Self-pay | Admitting: Family Medicine

## 2017-12-08 ENCOUNTER — Ambulatory Visit: Payer: Medicare Other | Admitting: Family Medicine

## 2017-12-08 DIAGNOSIS — E1122 Type 2 diabetes mellitus with diabetic chronic kidney disease: Secondary | ICD-10-CM

## 2017-12-08 DIAGNOSIS — Z5181 Encounter for therapeutic drug level monitoring: Secondary | ICD-10-CM

## 2017-12-08 DIAGNOSIS — E785 Hyperlipidemia, unspecified: Secondary | ICD-10-CM

## 2017-12-08 DIAGNOSIS — N183 Chronic kidney disease, stage 3 unspecified: Secondary | ICD-10-CM

## 2017-12-08 DIAGNOSIS — I10 Essential (primary) hypertension: Secondary | ICD-10-CM | POA: Diagnosis not present

## 2017-12-08 DIAGNOSIS — I350 Nonrheumatic aortic (valve) stenosis: Secondary | ICD-10-CM | POA: Diagnosis not present

## 2017-12-08 MED ORDER — ROSUVASTATIN CALCIUM 5 MG PO TABS: ORAL_TABLET | ORAL | 11 refills | Status: DC

## 2017-12-08 NOTE — Assessment & Plan Note (Signed)
Check A1c today; foot exam UTD: eye exam UTD; patient checking sugars 3x a day will record

## 2017-12-08 NOTE — Assessment & Plan Note (Signed)
Due for echocardiogram on April 22

## 2017-12-08 NOTE — Assessment & Plan Note (Signed)
Well-controlled; continue medicine; try to limit salt, try to follow DASH guidelines

## 2017-12-08 NOTE — Assessment & Plan Note (Signed)
Check liver and kidneys 

## 2017-12-08 NOTE — Assessment & Plan Note (Signed)
Patient has struggled with side effects from Crestor; will try just one a week and see how he does; limit saturated fats

## 2017-12-08 NOTE — Assessment & Plan Note (Signed)
Reviewed last note from nephrologist

## 2017-12-08 NOTE — Patient Instructions (Addendum)
If you are checking your sugars 3x a day, please record ALL of them going forward Ask your heart doctor if it is okay to take Viagra You can take your fish oil just once a day Try to take the rosuvastatin just once a week Return for labs on or just after May 15th Try to follow the DASH guidelines (DASH stands for Dietary Approaches to Stop Hypertension). Try to limit the sodium in your diet to no more than 1,500mg  of sodium per day. Certainly try to not exceed 2,000 mg per day at the very most. Do not add salt when cooking or at the table.  Check the sodium amount on labels when shopping, and choose items lower in sodium when given a choice. Avoid or limit foods that already contain a lot of sodium. Eat a diet rich in fruits and vegetables and whole grains, and try to lose weight if overweight or obese Try to limit saturated fats in your diet (bologna, hot dogs, barbeque, cheeseburgers, hamburgers, steak, bacon, sausage, cheese, etc.) and get more fresh fruits, vegetables, and whole grains  Cholesterol Cholesterol is a fat. Your body needs a small amount of cholesterol. Cholesterol (plaque) may build up in your blood vessels (arteries). That makes you more likely to have a heart attack or stroke. You cannot feel your cholesterol level. Having a blood test is the only way to find out if your level is high. Keep your test results. Work with your doctor to keep your cholesterol at a good level. What do the results mean?  Total cholesterol is how much cholesterol is in your blood.  LDL is bad cholesterol. This is the type that can build up. Try to have low LDL.  HDL is good cholesterol. It cleans your blood vessels and carries LDL away. Try to have high HDL.  Triglycerides are fat that the body can store or burn for energy. What are good levels of cholesterol?  Total cholesterol below 200.  LDL below 100 is good for people who have health risks. LDL below 70 is good for people who have very high  risks.  HDL above 40 is good. It is best to have HDL of 60 or higher.  Triglycerides below 150. How can I lower my cholesterol? Diet Follow your diet program as told by your doctor.  Choose fish, white meat chicken, or [place] that is roasted or baked. Try not to eat red meat, fried foods, sausage, or lunch meats.  Eat lots of fresh fruits and vegetables.  Choose whole grains, beans, pasta, potatoes, and cereals.  Choose olive oil, corn oil, or canola oil. Only use small amounts.  Try not to eat butter, mayonnaise, shortening, or palm kernel oils.  Try not to eat foods with trans fats.  Choose low-fat or nonfat dairy foods. ? Drink skim or nonfat milk. ? Eat low-fat or nonfat yogurt and cheeses. ? Try not to drink whole milk or cream. ? Try not to eat ice cream, egg yolks, or full-fat cheeses.  Healthy desserts include angel food cake, ginger snaps, animal crackers, hard candy, popsicles, and low-fat or nonfat frozen yogurt. Try not to eat pastries, cakes, pies, and cookies.  Exercise Follow your exercise program as told by your doctor.  Be more active. Try gardening, walking, and taking the stairs.  Ask your doctor about ways that you can be more active.  Medicine  Take over-the-counter and prescription medicines only as told by your doctor. This information is not intended to  replace advice given to you by your health care provider. Make sure you discuss any questions you have with your health care provider. Document Released: 11/07/2008 Document Revised: 03/12/2016 Document Reviewed: 02/21/2016 Elsevier Interactive Patient Education  2018 [employer].  Chronic Kidney Disease, Adult Chronic kidney disease (CKD) happens when the kidneys are damaged during a time of 3 or more months. The kidneys are two organs that do many important jobs in the body. These jobs include:  Removing wastes and extra fluids from the blood.  Making hormones that maintain the amount of  fluid in your tissues and blood vessels.  Making sure that the body has the right amount of fluids and chemicals.  Most of the time, this condition does not go away, but it can usually be controlled. Steps must be taken to slow down the kidney damage or stop it from getting worse. Otherwise, the kidneys may stop working. Follow these instructions at home:  Follow your diet as told by your doctor. You may need to avoid alcohol, salty foods (sodium), and foods that are high in potassium, calcium, and protein.  Take over-the-counter and prescription medicines only as told by your doctor. Do not take any new medicines unless your doctor says you can do that. These include vitamins and minerals. ? Medicines and nutritional supplements can make kidney damage worse. ? Your doctor may need to change how much medicine you take.  Do not use any tobacco products. These include cigarettes, chewing tobacco, and e-cigarettes. If you need help quitting, ask your doctor.  Keep all follow-up visits as told by your doctor. This is important.  Check your blood pressure. Tell your doctor if there are changes to your blood pressure.  Get to a healthy weight. Stay at that weight. If you need help with this, ask your doctor.  Start or continue an exercise plan. Try to exercise at least 30 minutes a day, 5 days a week.  Stay up-to-date with your shots (immunizations) as told by your doctor. Contact a doctor if:  Your symptoms get worse.  You have new symptoms. Get help right away if:  You have symptoms of end-stage kidney disease. These include: ? Headaches. ? Skin that is darker or lighter than normal. ? Numbness in your hands or feet. ? Easy bruising. ? Having hiccups often. ? Chest pain. ? Shortness of breath. ? Stopping of menstrual periods in women.  You have a fever.  You are making very little pee (urine).  You have pain or bleeding when you pee (urinate). This information is not  intended to replace advice given to you by your health care provider. Make sure you discuss any questions you have with your health care provider. Document Released: 11/05/2009 Document Revised: 01/17/2016 Document Reviewed: 04/09/2012 Elsevier Interactive Patient Education  2017 [employer].

## 2017-12-08 NOTE — Progress Notes (Signed)
BP 130/72   Pulse 97   Temp 98 F (36.7 C) (Oral)   Resp 16   Ht 5' 10.5" (1.791 m)   Wt 178 lb 4.8 oz (80.9 kg)   SpO2 97%   BMI 25.22 kg/m    Subjective:    Patient ID: Brian [name], male    DOB: Apr 14, 1934, 84 y.o.   MRN: [id number]  HPI: Brian [name] is a 84 y.o. male  Chief Complaint  Patient presents with  . Follow-up    Fasting Labs. Pt states he ate about an hour ago.   . Medication Management    Pt states that the rouvastatin cause his hands to ache    HPI Patient is here for f/u  He has type 2 diabetes Checking sugars once a day; he has them logged for once a day; lowest in the last month was 93 and highest was 153; he is taking insulin; has had diabetes for 20 years; he checks his sugars 3x a day; he uses a OneTouch Ultra 2; last eye exam UTD Lab Results  Component Value Date   HGBA1C 9.6 (H) 09/10/2017   Hypertension and CKD stage 3; strong fam hx; "staying away from salt the best that I can"; not eating a lot of chips  High cholesterol; not tolerating rosuvastatin; even said that if he aches like an old man, he's willing to take the pill at all, no hx of stroke or heart attack  Saw cardiologist on Friday April 12th and goes for the echocardiogram on April 22nd   Depression screen [facility] 2/9 12/08/2017 11/04/2017 09/10/2017 06/08/2017 02/27/2017  Decreased Interest 0 0 0 0 0  Down, Depressed, Hopeless 0 0 0 0 0  PHQ - 2 Score 0 0 0 0 0    Relevant past medical, surgical, family and social history reviewed Past Medical History:  Diagnosis Date  . Anemia of chronic renal failure 05/08/2015  . Aortic stenosis, mild 09/21/2015  . Arthritis   . Bladder stone   . Chronic kidney disease (CKD), stage III (moderate) ([place]) 05/08/2015   Followed by Dr. Rolly Salter   . Diastolic dysfunction without heart failure 06/08/2017  . History of bladder stone   . Hyperlipidemia   . Hypertension   . Nocturia   . Renal disorder   . Type 2 diabetes mellitus ([place])     Past Surgical History:  Procedure Laterality Date  . CYSTOSCOPY/RETROGRADE/URETEROSCOPY/STONE EXTRACTION WITH BASKET N/A 03/22/2013   Procedure: CYSTOSCOPY BLADDER STONE STONE EXTRACTION;  Surgeon: [name], MD;  Location: [facility];  Service: Urology;  Laterality: N/A;  CYSTOSCOPY    . TONSILLECTOMY     Family History  Problem Relation Age of Onset  . Congestive Heart Failure Unknown   . Diabetes Brother    Social History   Tobacco Use  . Smoking status: Former Smoker    Packs/day: 0.75    Years: 39.00    Pack years: 29.25    Types: Cigarettes    Last attempt to quit: 03/21/1993    Years since quitting: 24.7  . Smokeless tobacco: Never Used  Substance Use Topics  . Alcohol use: No    Alcohol/week: 0.0 oz  . Drug use: No    Interim medical history since last visit reviewed. Allergies and medications reviewed  Review of Systems Per HPI unless specifically indicated above     Objective:    BP 130/72   Pulse 97   Temp 98 F (36.7 C) (  Oral)   Resp 16   Ht 5' 10.5" (1.791 m)   Wt 178 lb 4.8 oz (80.9 kg)   SpO2 97%   BMI 25.22 kg/m   Wt Readings from Last 3 Encounters:  12/08/17 178 lb 4.8 oz (80.9 kg)  12/04/17 179 lb (81.2 kg)  11/04/17 178 lb 6.4 oz (80.9 kg)    Physical Exam  Constitutional: He appears well-developed and well-nourished. No distress.  HENT:  Head: Normocephalic and atraumatic.  Eyes: EOM are normal. No scleral icterus.  Neck: No thyromegaly present.  Cardiovascular: Normal rate and regular rhythm.  Pulmonary/Chest: Effort normal and breath sounds normal.  Abdominal: Soft. Bowel sounds are normal. He exhibits no distension.  Musculoskeletal: He exhibits no edema.  Neurological: Coordination normal.  Skin: Skin is warm and dry. No pallor.  Psychiatric: He has a normal mood and affect. His behavior is normal. Judgment and thought content normal.   Diabetic Foot Form - Detailed   Diabetic Foot Exam -  detailed Diabetic Foot exam was performed with the following findings:  Yes 12/08/2017 12:19 PM  Visual Foot Exam completed.:  Yes  Pulse Foot Exam completed.:  Yes  Right Dorsalis Pedis:  Present Left Dorsalis Pedis:  Present  Sensory Foot Exam Completed.:  Yes Semmes-Weinstein Monofilament Test R Site 1-Great Toe:  Pos L Site 1-Great Toe:  Pos        Results for orders placed or performed in visit on 11/04/17  Lipid panel  Result Value Ref Range   Cholesterol 205 (H) <200 mg/dL   HDL 59 >40 mg/dL   Triglycerides 74 <150 mg/dL   LDL Cholesterol (Calc) 129 (H) mg/dL (calc)   Total CHOL/HDL Ratio 3.5 <5.0 (calc)   Non-HDL Cholesterol (Calc) 146 (H) <130 mg/dL (calc)      Assessment & Plan:   Problem List Items Addressed This Visit      Cardiovascular and Mediastinum   Hypertension goal BP (blood pressure) < 140/90 (Chronic)    Well-controlled; continue medicine; try to limit salt, try to follow DASH guidelines      Relevant Medications   rosuvastatin (CRESTOR) 5 MG tablet   Aortic stenosis, mild (Chronic)    Due for echocardiogram on April 22      Relevant Medications   rosuvastatin (CRESTOR) 5 MG tablet     Endocrine   Type 2 diabetes, controlled, with renal manifestation (HCC) (Chronic)    Check A1c today; foot exam UTD: eye exam UTD; patient checking sugars 3x a day will record      Relevant Medications   rosuvastatin (CRESTOR) 5 MG tablet   Other Relevant Orders   Microalbumin / creatinine urine ratio   Hemoglobin A1c     Genitourinary   Chronic kidney disease (CKD), stage III (moderate) (HCC) (Chronic)    Reviewed last note from nephrologist      Relevant Orders   Microalbumin / creatinine urine ratio     Other   Hyperlipidemia LDL goal <100 (Chronic)    Patient has struggled with side effects from Crestor; will try just one a week and see how he does; limit saturated fats      Relevant Medications   rosuvastatin (CRESTOR) 5 MG tablet   Other  Relevant Orders   Lipid panel   Encounter for medication monitoring    Check liver and kidneys      Relevant Orders   COMPLETE METABOLIC PANEL WITH GFR       Follow up plan: Return in about  4 months (around 04/09/2018) for follow-up visit with Dr. Sanda Klein.  An after-visit summary was printed and given to the patient at [facility].  Please see the patient instructions which may contain other information and recommendations beyond what is mentioned above in the assessment and plan.  Meds ordered this encounter  Medications  . rosuvastatin (CRESTOR) 5 MG tablet    Sig: One by mouth just one night a week    Dispense:  4 tablet    Refill:  11    Orders Placed This Encounter  Procedures  . Microalbumin / creatinine urine ratio  . Lipid panel  . Hemoglobin A1c  . COMPLETE METABOLIC PANEL WITH GFR

## 2017-12-10 ENCOUNTER — Ambulatory Visit: Payer: Medicare Other | Admitting: Nurse Practitioner

## 2017-12-14 ENCOUNTER — Other Ambulatory Visit: Payer: Self-pay | Admitting: Family Medicine

## 2017-12-14 ENCOUNTER — Ambulatory Visit (HOSPITAL_COMMUNITY): Payer: Medicare Other | Attending: Internal Medicine

## 2017-12-14 ENCOUNTER — Other Ambulatory Visit: Payer: Self-pay

## 2017-12-14 DIAGNOSIS — E1122 Type 2 diabetes mellitus with diabetic chronic kidney disease: Secondary | ICD-10-CM | POA: Insufficient documentation

## 2017-12-14 DIAGNOSIS — E785 Hyperlipidemia, unspecified: Secondary | ICD-10-CM | POA: Diagnosis not present

## 2017-12-14 DIAGNOSIS — I350 Nonrheumatic aortic (valve) stenosis: Secondary | ICD-10-CM | POA: Insufficient documentation

## 2017-12-14 DIAGNOSIS — D649 Anemia, unspecified: Secondary | ICD-10-CM | POA: Diagnosis not present

## 2017-12-14 DIAGNOSIS — N189 Chronic kidney disease, unspecified: Secondary | ICD-10-CM | POA: Insufficient documentation

## 2017-12-14 DIAGNOSIS — N529 Male erectile dysfunction, unspecified: Secondary | ICD-10-CM

## 2017-12-14 DIAGNOSIS — I129 Hypertensive chronic kidney disease with stage 1 through stage 4 chronic kidney disease, or unspecified chronic kidney disease: Secondary | ICD-10-CM | POA: Insufficient documentation

## 2017-12-14 MED ORDER — SILDENAFIL CITRATE 100 MG PO TABS: 50.0000 mg | ORAL_TABLET | Freq: Every day | ORAL | 5 refills | Status: DC | PRN

## 2017-12-14 NOTE — Progress Notes (Signed)
Rx sent 

## 2017-12-14 NOTE — Telephone Encounter (Signed)
Received rx request from [facility], rx was previously at [facility], however they have closed.

## 2017-12-15 NOTE — Telephone Encounter (Signed)
The med was viagra; I sent Rx in another document

## 2017-12-17 ENCOUNTER — Other Ambulatory Visit: Payer: Self-pay

## 2017-12-17 DIAGNOSIS — I5189 Other ill-defined heart diseases: Secondary | ICD-10-CM

## 2017-12-17 DIAGNOSIS — I350 Nonrheumatic aortic (valve) stenosis: Secondary | ICD-10-CM

## 2017-12-17 NOTE — Progress Notes (Signed)
echo

## 2018-01-24 ENCOUNTER — Other Ambulatory Visit: Payer: Self-pay | Admitting: Family Medicine

## 2018-01-24 DIAGNOSIS — N183 Chronic kidney disease, stage 3 unspecified: Secondary | ICD-10-CM

## 2018-01-24 DIAGNOSIS — E785 Hyperlipidemia, unspecified: Secondary | ICD-10-CM

## 2018-01-24 DIAGNOSIS — Z5181 Encounter for therapeutic drug level monitoring: Secondary | ICD-10-CM

## 2018-01-24 DIAGNOSIS — E1122 Type 2 diabetes mellitus with diabetic chronic kidney disease: Secondary | ICD-10-CM

## 2018-01-26 NOTE — Telephone Encounter (Signed)
Please remind patient that he is overdue for labs

## 2018-01-26 NOTE — Telephone Encounter (Signed)
Pt.notified

## 2018-01-30 LAB — COMPLETE METABOLIC PANEL WITH GFR
AG Ratio: 1.4 (calc) (ref 1.0–2.5)
ALBUMIN MSPROF: 3.9 g/dL (ref 3.6–5.1)
ALKALINE PHOSPHATASE (APISO): 48 U/L (ref 40–115)
ALT: 7 U/L — ABNORMAL LOW (ref 9–46)
AST: 15 U/L (ref 10–35)
BUN / CREAT RATIO: 15 (calc) (ref 6–22)
BUN: 22 mg/dL (ref 7–25)
CALCIUM: 9.1 mg/dL (ref 8.6–10.3)
CO2: 27 mmol/L (ref 20–32)
CREATININE: 1.44 mg/dL — AB (ref 0.70–1.11)
Chloride: 106 mmol/L (ref 98–110)
GFR, EST NON AFRICAN AMERICAN: 45 mL/min/{1.73_m2} — AB (ref >=60)
GFR, Est African American: 52 mL/min/{1.73_m2} — ABNORMAL LOW (ref >=60)
GLOBULIN: 2.8 g/dL (ref 1.9–3.7)
GLUCOSE: 102 mg/dL — AB (ref 65–99)
Potassium: 4.4 mmol/L (ref 3.5–5.3)
Sodium: 139 mmol/L (ref 135–146)
TOTAL PROTEIN: 6.7 g/dL (ref 6.1–8.1)
Total Bilirubin: 0.4 mg/dL (ref 0.2–1.2)

## 2018-01-30 LAB — LIPID PANEL
CHOL/HDL RATIO: 4 (calc) (ref <5.0)
CHOLESTEROL: 214 mg/dL — AB (ref <200)
HDL: 54 mg/dL (ref >40)
LDL Cholesterol (Calc): 142 mg/dL (calc) — ABNORMAL HIGH
Non-HDL Cholesterol (Calc): 160 mg/dL (calc) — ABNORMAL HIGH (ref <130)
Triglycerides: 76 mg/dL (ref <150)

## 2018-01-30 LAB — MICROALBUMIN / CREATININE URINE RATIO
Creatinine, Urine: 191 mg/dL (ref 20–320)
MICROALB/CREAT RATIO: 6 ug/mg{creat} (ref <30)
Microalb, Ur: 1.1 mg/dL

## 2018-01-30 LAB — HEMOGLOBIN A1C
HEMOGLOBIN A1C: 8.5 %{Hb} — AB (ref <5.7)
Mean Plasma Glucose: 197 (calc)
eAG (mmol/L): 10.9 (calc)

## 2018-02-03 ENCOUNTER — Other Ambulatory Visit: Payer: Self-pay | Admitting: Family Medicine

## 2018-02-03 MED ORDER — INSULIN DETEMIR 100 UNIT/ML FLEXPEN: 33.0000 [IU] | PEN_INJECTOR | Freq: Every day | SUBCUTANEOUS | 6 refills | Status: DC

## 2018-02-03 NOTE — Progress Notes (Signed)
Increase insulin 

## 2018-02-08 ENCOUNTER — Other Ambulatory Visit: Payer: Self-pay | Admitting: Family Medicine

## 2018-02-22 ENCOUNTER — Encounter: Payer: Self-pay | Admitting: Family Medicine

## 2018-02-22 ENCOUNTER — Other Ambulatory Visit: Payer: Self-pay | Admitting: Family Medicine

## 2018-02-22 ENCOUNTER — Ambulatory Visit: Payer: Medicare Other | Admitting: Family Medicine

## 2018-02-22 DIAGNOSIS — E1165 Type 2 diabetes mellitus with hyperglycemia: Secondary | ICD-10-CM | POA: Diagnosis not present

## 2018-02-22 DIAGNOSIS — E1121 Type 2 diabetes mellitus with diabetic nephropathy: Secondary | ICD-10-CM | POA: Diagnosis not present

## 2018-02-22 DIAGNOSIS — N183 Chronic kidney disease, stage 3 unspecified: Secondary | ICD-10-CM

## 2018-02-22 DIAGNOSIS — IMO0002 Reserved for concepts with insufficient information to code with codable children: Secondary | ICD-10-CM | POA: Insufficient documentation

## 2018-02-22 DIAGNOSIS — I10 Essential (primary) hypertension: Secondary | ICD-10-CM | POA: Diagnosis not present

## 2018-02-22 DIAGNOSIS — E785 Hyperlipidemia, unspecified: Secondary | ICD-10-CM | POA: Diagnosis not present

## 2018-02-22 MED ORDER — PIOGLITAZONE HCL 15 MG PO TABS: 15.0000 mg | ORAL_TABLET | Freq: Every day | ORAL | 5 refills | Status: DC

## 2018-02-22 MED ORDER — NYSTATIN-TRIAMCINOLONE 100000-0.1 UNIT/GM-% EX CREA: TOPICAL_CREAM | CUTANEOUS | 2 refills | Status: DC

## 2018-02-22 NOTE — Assessment & Plan Note (Signed)
Managed by kidney doctor 

## 2018-02-22 NOTE — Assessment & Plan Note (Signed)
Add actos; he does not want to use tradjenta and he does not want to use meal-time insulin; check A1c in Sept

## 2018-02-22 NOTE — Patient Instructions (Addendum)
Please do take the rosuvastatin (Crestor) once a week for cholesterol Stop the [employer] Actos (pioglitazone)

## 2018-02-22 NOTE — Telephone Encounter (Signed)
Refill of quinapril not needed yet; please resolve with pharmacy

## 2018-02-22 NOTE — Progress Notes (Signed)
BP 118/64 (BP Location: Right Arm, Patient Position: Sitting, Cuff Size: Large)   Pulse 76   Temp 98.4 F (36.9 C) (Oral)   Resp 16   Ht 5\' 11"  (1.803 m)   Wt 174 lb 1.6 oz (79 kg)   SpO2 98%   BMI 24.28 kg/m    Subjective:    Patient ID: Brian [name], male    DOB: 1934/03/10, 84 y.o.   MRN: [id number]  HPI: Brian [name] is a 84 y.o. male  Chief Complaint  Patient presents with  . Follow-up    patient is here fort his 3 month f/u  . Diabetes     patient checks his blood sugar 3/day. he has his glucose log with him. Highest:  164  & lowest: 70. he stated that he cannot take the Tradjenta because it causes a rash.  . Hyperlipidemia    patient stated that he cannot take the rosuvastatin due to joint aches.    HPI Patient is here for diabetic follow-up He brought in his glucose log; log shows recordings for one time a day in the morning 70-148 Readings for noon 200 to 240; after breakfast of raisin bran and oatmeal; no orange juice; uses diet Mt Dew; sometimes itty bitty candy Readings for evening 205 to 245 Does not want to take tradjenta He does not want to do meal-time shots Blood sugar in the office on his machine was 104 this morning  High cholesterol; has not started the crestor yet  Blood pressure; well-controlled on the amlodipine and quinapril; not adding salt to diet  CKD stage 3; seeing nephrologist; avoiding NSAIDs; okay for aspirin  Depression screen [facility] 2/9 02/22/2018 12/08/2017 11/04/2017 09/10/2017 06/08/2017  Decreased Interest 0 0 0 0 0  Down, Depressed, Hopeless 0 0 0 0 0  PHQ - 2 Score 0 0 0 0 0    Relevant past medical, surgical, family and social history reviewed Past Medical History:  Diagnosis Date  . Anemia of chronic renal failure 05/08/2015  . Aortic stenosis, mild 09/21/2015  . Arthritis   . Bladder stone   . Chronic kidney disease (CKD), stage III (moderate) ([place]) 05/08/2015   Followed by Dr. Rolly Salter   . Diastolic dysfunction  without heart failure 06/08/2017  . History of bladder stone   . Hyperlipidemia   . Hypertension   . Nocturia   . Renal disorder   . Type 2 diabetes mellitus ([place])    Past Surgical History:  Procedure Laterality Date  . CYSTOSCOPY/RETROGRADE/URETEROSCOPY/STONE EXTRACTION WITH BASKET N/A 03/22/2013   Procedure: CYSTOSCOPY BLADDER STONE STONE EXTRACTION;  Surgeon: [name], MD;  Location: [facility];  Service: Urology;  Laterality: N/A;  CYSTOSCOPY    . TONSILLECTOMY     Family History  Problem Relation Age of Onset  . Congestive Heart Failure Unknown   . Diabetes Brother    Social History   Tobacco Use  . Smoking status: Former Smoker    Packs/day: 0.75    Years: 39.00    Pack years: 29.25    Types: Cigarettes    Last attempt to quit: 03/21/1993    Years since quitting: 24.9  . Smokeless tobacco: Never Used  Substance Use Topics  . Alcohol use: No    Alcohol/week: 0.0 oz  . Drug use: No    Interim medical history since last visit reviewed. Allergies and medications reviewed  Review of Systems  Respiratory: Negative for shortness of breath.   Skin: Positive  for rash (creases of legs).   Per HPI unless specifically indicated above     Objective:    BP 118/64 (BP Location: Right Arm, Patient Position: Sitting, Cuff Size: Large)   Pulse 76   Temp 98.4 F (36.9 C) (Oral)   Resp 16   Ht 5\' 11"  (1.803 m)   Wt 174 lb 1.6 oz (79 kg)   SpO2 98%   BMI 24.28 kg/m   Wt Readings from Last 3 Encounters:  02/22/18 174 lb 1.6 oz (79 kg)  12/08/17 178 lb 4.8 oz (80.9 kg)  12/04/17 179 lb (81.2 kg)  MD note: last visit was with shoes on  Physical Exam  Constitutional: He appears well-developed and well-nourished. No distress.  HENT:  Head: Normocephalic and atraumatic.  Eyes: EOM are normal. No scleral icterus.  Neck: No thyromegaly present.  Cardiovascular: Normal rate and regular rhythm.  Pulmonary/Chest: Effort normal and breath sounds  normal.  Abdominal: Soft. Bowel sounds are normal. He exhibits no distension.  Musculoskeletal: He exhibits no edema.  Neurological: Coordination normal.  Skin: Skin is warm and dry. No pallor.  Psychiatric: He has a normal mood and affect. His behavior is normal. Judgment and thought content normal.   Diabetic Foot Form - Detailed   Diabetic Foot Exam - detailed Diabetic Foot exam was performed with the following findings:  Yes 02/22/2018  8:29 AM  Visual Foot Exam completed.:  Yes  Pulse Foot Exam completed.:  Yes  Right Dorsalis Pedis:  Present Left Dorsalis Pedis:  Present  Sensory Foot Exam Completed.:  Yes Semmes-Weinstein Monofilament Test R Site 1-Great Toe:  Pos L Site 1-Great Toe:  Pos         Results for orders placed or performed in visit on 01/24/18  COMPLETE METABOLIC PANEL WITH GFR  Result Value Ref Range   Glucose, Bld 102 (H) 65 - 99 mg/dL   BUN 22 7 - 25 mg/dL   Creat 1.44 (H) 0.70 - 1.11 mg/dL   GFR, Est Non African American 45 (L) > OR = 60 mL/min/1.57m2   GFR, Est African American 52 (L) > OR = 60 mL/min/1.27m2   BUN/Creatinine Ratio 15 6 - 22 (calc)   Sodium 139 135 - 146 mmol/L   Potassium 4.4 3.5 - 5.3 mmol/L   Chloride 106 98 - 110 mmol/L   CO2 27 20 - 32 mmol/L   Calcium 9.1 8.6 - 10.3 mg/dL   Total Protein 6.7 6.1 - 8.1 g/dL   Albumin 3.9 3.6 - 5.1 g/dL   Globulin 2.8 1.9 - 3.7 g/dL (calc)   AG Ratio 1.4 1.0 - 2.5 (calc)   Total Bilirubin 0.4 0.2 - 1.2 mg/dL   Alkaline phosphatase (APISO) 48 40 - 115 U/L   AST 15 10 - 35 U/L   ALT 7 (L) 9 - 46 U/L  Hemoglobin A1c  Result Value Ref Range   Hgb A1c MFr Bld 8.5 (H) <5.7 % of total Hgb   Mean Plasma Glucose 197 (calc)   eAG (mmol/L) 10.9 (calc)  Lipid panel  Result Value Ref Range   Cholesterol 214 (H) <200 mg/dL   HDL 54 >40 mg/dL   Triglycerides 76 <150 mg/dL   LDL Cholesterol (Calc) 142 (H) mg/dL (calc)   Total CHOL/HDL Ratio 4.0 <5.0 (calc)   Non-HDL Cholesterol (Calc) 160 (H) <130 mg/dL  (calc)  Microalbumin / creatinine urine ratio  Result Value Ref Range   Creatinine, Urine 191 20 - 320 mg/dL   Microalb,  Ur 1.1 mg/dL   Microalb Creat Ratio 6 <30 mcg/mg creat      Assessment & Plan:   Problem List Items Addressed This Visit      Cardiovascular and Mediastinum   Hypertension goal BP (blood pressure) < 140/90 (Chronic)    Well-controlled today        Endocrine   Uncontrolled type 2 diabetes mellitus with diabetic nephropathy ([place])    Add actos; he does not want to use tradjenta and he does not want to use meal-time insulin; check A1c in Sept      Relevant Medications   pioglitazone (ACTOS) 15 MG tablet     Genitourinary   Chronic kidney disease (CKD), stage III (moderate) (HCC) (Chronic)    Managed by kidney doctor        Other   Hyperlipidemia LDL goal <100 (Chronic)    Start on the crestor once a week; limit saturated fats          Follow up plan: Return in about 2 months (around 05/03/2018) for twenty minute follow-up with fasting labs.  An after-visit summary was printed and given to the patient at [facility].  Please see the patient instructions which may contain other information and recommendations beyond what is mentioned above in the assessment and plan.  Meds ordered this encounter  Medications  . pioglitazone (ACTOS) 15 MG tablet    Sig: Take 1 tablet (15 mg total) by mouth daily.    Dispense:  30 tablet    Refill:  5  . DISCONTD: nystatin-triamcinolone (MYCOLOG II) cream    Sig: APPLY TO AFFECTED AREAS 3 TIMES DAILY ASNEEDED    Dispense:  60 g    Refill:  2  . nystatin-triamcinolone (MYCOLOG II) cream    Sig: APPLY TO AFFECTED AREAS 3 TIMES DAILY ASNEEDED    Dispense:  60 g    Refill:  2    No orders of the defined types were placed in this encounter.

## 2018-02-22 NOTE — Assessment & Plan Note (Signed)
Well controlled today.

## 2018-02-22 NOTE — Assessment & Plan Note (Signed)
Start on the crestor once a week; limit saturated fats

## 2018-03-18 ENCOUNTER — Telehealth: Payer: Self-pay

## 2018-03-18 MED ORDER — INSULIN DETEMIR 100 UNIT/ML FLEXPEN: 20.0000 [IU] | PEN_INJECTOR | Freq: Every day | SUBCUTANEOUS | Status: DC

## 2018-03-18 MED ORDER — INSULIN DETEMIR 100 UNIT/ML FLEXPEN: 20.0000 [IU] | PEN_INJECTOR | Freq: Every day | SUBCUTANEOUS | 2 refills | Status: DC

## 2018-03-18 NOTE — Telephone Encounter (Signed)
I updated med list; new Rx to pharmacy saying NO automatic refills

## 2018-03-18 NOTE — Telephone Encounter (Signed)
Pt came in today to inform you about his insulin usage.  He wants me to call the pharmacy and let them know not to automatically refill his insulin until he calls for it.  He has decreased his dosage of insulin most of the time he is  Only taking 20 units instead of the 33 units due to blood sugar running in the 80's.  Sometimes he will increase according to how his sugar is running.  He check blood sugars 3x a day.

## 2018-03-30 ENCOUNTER — Other Ambulatory Visit: Payer: Self-pay | Admitting: Family Medicine

## 2018-03-30 NOTE — Telephone Encounter (Signed)
Too soon for refill of ACE-I

## 2018-04-09 ENCOUNTER — Ambulatory Visit: Payer: Medicare Other | Admitting: Family Medicine

## 2018-04-22 NOTE — Telephone Encounter (Signed)
eronous encounter

## 2018-04-30 ENCOUNTER — Ambulatory Visit (INDEPENDENT_AMBULATORY_CARE_PROVIDER_SITE_OTHER): Payer: Medicare Other

## 2018-04-30 VITALS — BP 116/68 | HR 68 | Temp 98.0°F | Resp 14 | Ht 71.0 in | Wt 176.1 lb

## 2018-04-30 DIAGNOSIS — Z598 Other problems related to housing and economic circumstances: Secondary | ICD-10-CM | POA: Diagnosis not present

## 2018-04-30 DIAGNOSIS — Z0000 Encounter for general adult medical examination without abnormal findings: Secondary | ICD-10-CM | POA: Diagnosis not present

## 2018-04-30 DIAGNOSIS — Z599 Problem related to housing and economic circumstances, unspecified: Secondary | ICD-10-CM

## 2018-04-30 DIAGNOSIS — Z23 Encounter for immunization: Secondary | ICD-10-CM

## 2018-04-30 NOTE — Progress Notes (Signed)
Subjective:   Brian [name] is a 84 y.o. male who presents for an Initial Medicare Annual Wellness Visit.  Review of Systems  N/A Cardiac Risk Factors include: advanced age (>67mn, >>24women);diabetes mellitus;dyslipidemia;hypertension;male gender;sedentary lifestyle    Objective:    Today's Vitals   04/30/18 0744  BP: 116/68  Pulse: 68  Resp: 14  Temp: 98 F (36.7 C)  TempSrc: Oral  SpO2: 94%  Weight: 176 lb 1.6 oz (79.9 kg)  Height: '5\' 11"'$  (1.803 m)   Body mass index is 24.56 kg/m.  Advanced Directives 04/30/2018 06/08/2017 02/27/2017 11/03/2016 08/12/2016 05/05/2016 12/24/2015  Does Patient Have a Medical Advance Directive? No Yes No Yes Yes Yes No  Type of Advance Directive - - -Public librarianLiving will HRinggoldLiving will Living will -  Copy of [place]? - - - - - No - copy requested -  Would patient like information on creating a medical advance directive? Yes (MAU/Ambulatory/Procedural Areas - Information given) - - - - - No - patient declined information  Pre-existing out of facility DNR order (yellow form or pink MOST form) - - - - - - -    Current Medications (verified) Outpatient Encounter Medications as of 04/30/2018  Medication Sig  . amLODipine (NORVASC) 10 MG tablet Take 1 tablet (10 mg total) by mouth daily. (for high blood pressure)  . aspirin EC 81 MG tablet Take 81 mg by mouth daily. Reported on 09/17/2015  . Blood Glucose Monitoring Suppl (ONE TOUCH ULTRA 2) W/DEVICE KIT   . docusate sodium (DOK) 100 MG capsule Take 1 capsule (100 mg total) by mouth 2 (two) times daily as needed.  . finasteride (PROSCAR) 5 MG tablet TAKE ONE (1) TABLET EACH DAY  . glucose blood (ONE TOUCH ULTRA TEST) test strip CHECK FINGERSTICK BLOOD SUGAR LEVEL THREE TIMES A DAY AS DIRECTED; E11.29; LON 99 months  . Insulin Detemir (LEVEMIR FLEXTOUCH) 100 UNIT/ML Pen Inject 20 Units into the skin daily.  . Insulin Pen  Needle (B-D UF III MINI PEN NEEDLES) 31G X 5 MM MISC USE AS DIRECTED WITH LEVEMIR INSULIN PENS AT BEDTIME, once a day  . nystatin-triamcinolone (MYCOLOG II) cream APPLY TO AFFECTED AREAS 3 TIMES DAILY ASNEEDED  . Omega-3 Fatty Acids (FISH OIL) 1000 MG CAPS Take 1 capsule (1,000 mg total) by mouth daily.  . pioglitazone (ACTOS) 15 MG tablet Take 1 tablet (15 mg total) by mouth daily.  . quinapril (ACCUPRIL) 20 MG tablet TAKE 1 TABLET BY MOUTH TWICE A DAY  . rosuvastatin (CRESTOR) 5 MG tablet One by mouth just one night a week  . sildenafil (VIAGRA) 100 MG tablet Take 0.5-1 tablets (50-100 mg total) by mouth daily as needed for erectile dysfunction.  . tamsulosin (FLOMAX) 0.4 MG CAPS capsule TAKE ONE CAPSULE BY MOUTH DAILY   No facility-administered encounter medications on file as of 04/30/2018.     Allergies (verified) Carvedilol; Niaspan [niacin er]; Shellfish allergy; Statins; Sulfa antibiotics; Zetia [ezetimibe]; and Januvia [sitagliptin]   History: Past Medical History:  Diagnosis Date  . Anemia of chronic renal failure 05/08/2015  . Aortic stenosis, mild 09/21/2015  . Arthritis   . Bladder stone   . Chronic kidney disease (CKD), stage III (moderate) ([place] 05/08/2015   Followed by Dr. KRolly Salter  . Diastolic dysfunction without heart failure 06/08/2017  . History of bladder stone   . Hyperlipidemia   . Hypertension   . Nocturia   .  Renal disorder   . Type 2 diabetes mellitus ([place])    Past Surgical History:  Procedure Laterality Date  . CYSTOSCOPY/RETROGRADE/URETEROSCOPY/STONE EXTRACTION WITH BASKET N/A 03/22/2013   Procedure: CYSTOSCOPY BLADDER STONE STONE EXTRACTION;  Surgeon: [name], MD;  Location: [facility];  Service: Urology;  Laterality: N/A;  CYSTOSCOPY    . TONSILLECTOMY     Family History  Problem Relation Age of Onset  . Congestive Heart Failure Unknown   . Diabetes Brother    Social History   Socioeconomic History  . Marital status:  Married    Spouse name: [name]  . Number of children: 4  . Years of education: some college  . Highest education level: 12th grade  Occupational History  . Occupation: Retired  Scientific laboratory technician  . Financial resource strain: Somewhat hard  . Food insecurity:    Worry: Never true    Inability: Never true  . Transportation needs:    Medical: No    Non-medical: No  Tobacco Use  . Smoking status: Former Smoker    Packs/day: 0.75    Years: 39.00    Pack years: 29.25    Types: Cigarettes    Last attempt to quit: 03/21/1993    Years since quitting: 25.1  . Smokeless tobacco: Never Used  . Tobacco comment: smoking cessation materials not required  Substance and Sexual Activity  . Alcohol use: No    Alcohol/week: 0.0 standard drinks  . Drug use: No  . Sexual activity: Yes  Lifestyle  . Physical activity:    Days per week: 0 days    Minutes per session: 0 min  . Stress: Not at all  Relationships  . Social connections:    Talks on phone: More than three times a week    Gets together: More than three times a week    Attends religious service: More than 4 times per year    Active member of club or organization: No    Attends meetings of clubs or organizations: Never    Relationship status: Married  Other Topics Concern  . Not on file  Social History Narrative   Lives with wife in [place], [place].   Tobacco Counseling Counseling given: No Comment: smoking cessation materials not required  Clinical Intake:  Pre-visit preparation completed: Yes  Pain : No/denies pain   BMI - recorded: 24.29 Nutritional Status: BMI of 19-24  Normal Nutritional Risks: None  Nutrition Risk Assessment: Has the patient had any N/V/D within the last 2 months?  No Does the patient have any non-healing wounds?  No Has the patient had any unintentional weight loss or weight gain?  No  Is the patient diabetic?  Yes If diabetic, was a CBG obtained today?  No Did the patient bring in their  glucometer from home?  No Comments: Pt monitors CBG's 3x's daily. Denies any financial strains with the device or supplies.  Diabetic Exams: Diabetic Eye Exam: Completed 06/17/17. Diabetic Foot Exam: Completed 02/22/18.   How often do you need to have someone help you when you read instructions, pamphlets, or other written materials from your doctor or pharmacy?: 1 - Never  Interpreter Needed?: No  Information entered by :: AEversole, LPN  Activities of Daily Living In your present state of health, do you have any difficulty performing the following activities: 04/30/2018 02/22/2018  Hearing? N N  Comment denies hearing aids -  Vision? N N  Comment wears eyeglasses -  Difficulty concentrating or making decisions? N N  Walking or climbing stairs? N N  Dressing or bathing? N N  Doing errands, shopping? N N  Preparing Food and eating ? N -  Comment full set upper and lower dentures -  Using the Toilet? N -  In the past six months, have you accidently leaked urine? N -  Do you have problems with loss of bowel control? N -  Managing your Medications? N -  Managing your Finances? N -  Housekeeping or managing your Housekeeping? N -  Some recent data might be hidden     Immunizations and Health Maintenance Immunization History  Administered Date(s) Administered  . Influenza, High Dose Seasonal PF 05/05/2016, 06/08/2017, 04/30/2018  . Influenza,inj,Quad PF,6+ Mos 05/08/2015  . Pneumococcal Conjugate-13 03/20/2014  . Pneumococcal Polysaccharide-23 09/08/2003   There are no preventive care reminders to display for this patient.  Patient Care Team: [name], [name], MD as PCP - General (Family Medicine) Skeet Latch, MD as Consulting Physician (Cardiology) Lavonia Dana, MD as Consulting Physician (Internal Medicine)  Indicate any recent Medical Services you may have received from other than [facility] providers in the past year (date may be approximate).    Assessment:   This is a  routine wellness examination for Brian [name].  Hearing/Vision screen Vision Screening Comments: [place] for annual eye exams  Dietary issues and exercise activities discussed: Current Exercise Habits: The patient does not participate in regular exercise at present, Exercise limited by: None identified  Goals    . DIET - INCREASE WATER INTAKE     Recommend to drink at least 6-8 8oz glasses of water per day.      Depression Screen PHQ 2/9 Scores 04/30/2018 02/22/2018 12/08/2017 11/04/2017  PHQ - 2 Score 0 0 0 0  PHQ- 9 Score 0 - - -    Fall Risk Fall Risk  04/30/2018 02/22/2018 12/08/2017 11/04/2017 09/10/2017  Falls in the past year? No No No No No  Risk for fall due to : Impaired vision - - - -  Risk for fall due to: Comment wears eyeglasses - - - -    FALL RISK PREVENTION PERTAINING TO HOME: Is your home free of loose throw rugs in walkways, pet beds, electrical cords, etc? Yes Is there adequate lighting in your home to reduce risk of falls?  Yes Are there stairs in or around your home WITH handrails? Yes  ASSISTIVE DEVICES UTILIZED TO PREVENT FALLS: Use of a cane, walker or w/c? No Grab bars in the bathroom? No  Shower chair or a place to sit while bathing? No An elevated toilet seat or a handicapped toilet? No  Timed Get Up and Go Performed: Yes. Pt ambulated 10 feet within 10 sec. Gait stead-fast and without the use of an assistive device. No intervention required at this time. Fall risk prevention has been discussed.  Community Resource Referral:  Pt declined my offer to send [employer] Referral to Care Guide for installation of grab bars in the shower, shower chair or an elevated toilet seat.  Cognitive Function:     6CIT Screen 04/30/2018  What Year? 0 points  What month? 0 points  What time? 3 points  Count back from 20 0 points  Months in reverse 0 points  Repeat phrase 6 points  Total Score 9    Screening Tests Health Maintenance  Topic Date  Due  . OPHTHALMOLOGY EXAM  06/17/2018  . HEMOGLOBIN A1C  07/31/2018  . FOOT EXAM  02/23/2019  . TETANUS/TDAP  05/19/2022  . INFLUENZA VACCINE  Completed  . PNA vac Low Risk Adult  Addressed    Qualifies for Shingles Vaccine? Yes. Due for Shingrix. Education has been provided regarding the importance of this vaccine. Pt has been advised to call insurance company to determine out of pocket expense. Advised may also receive vaccine at local pharmacy or Health Dept. Verbalized acceptance and understanding.  Cancer Screenings: Lung: Low Dose CT Chest recommended if Age 81-80 years, 30 pack-year currently smoking OR have quit w/in 15years. Patient does not qualify. Colorectal: No longer required  Additional Screenings: Hepatitis C Screening: Does not qualify    Plan:  I have personally reviewed and addressed the Medicare Annual Wellness questionnaire and have noted the following in the patient's chart:  A. Medical and social history B. Use of alcohol, tobacco or illicit drugs  C. Current medications and supplements D. Functional ability and status E.  Nutritional status F.  Physical activity G. Advance directives H. List of other physicians I.  Hospitalizations, surgeries, and ER visits in previous 12 months J.  [place] such as hearing and vision if needed, cognitive and depression L. Referrals and appointments  In addition, I have reviewed and discussed with patient certain preventive protocols, quality metrics, and best practice recommendations. A written personalized care plan for preventive services as well as general preventive health recommendations were provided to patient.  See attached scanned questionnaire for additional information.   Signed,  Aleatha Borer, LPN Nurse Health Advisor

## 2018-04-30 NOTE — Patient Instructions (Signed)
Brian [name] , Thank you for taking time to come for your Medicare Wellness Visit. I appreciate your ongoing commitment to your health goals. Please review the following plan we discussed and let me know if I can assist you in the future.   Screening recommendations/referrals: Colorectal Screening: No longer required  Vision and Dental Exams: Recommended annual ophthalmology exams for early detection of glaucoma and other disorders of the eye Recommended annual dental exams for proper oral hygiene  Diabetic Exams: Recommended annual diabetic eye exams for early detection of retinopathy Recommended annual diabetic foot exams for early detection of peripheral neuropathy.  Diabetic Eye Exam: Please call to schedule your appointment Diabetic Foot Exam: Up to date  Vaccinations: Influenza vaccine: Completed today Pneumococcal vaccine: Up to date Tdap vaccine: Up to date Shingles vaccine: Please call your insurance company to determine your out of pocket expense for the Shingrix vaccine. You may also receive this vaccine at your local pharmacy or Health Dept.    Advanced directives: Advance directive discussed with you today. I have provided a copy for you to complete at home and have notarized. Once this is complete please bring a copy in to our office so we can scan it into your chart.  Goals: Recommend to drink at least 6-8 8oz glasses of water per day.  Next appointment: Please schedule your Annual Wellness Visit with your Nurse Health Advisor in one year.  Preventive Care 10 Years and Older, Male Preventive care refers to lifestyle choices and visits with your health care provider that can promote health and wellness. What does preventive care include?  A yearly physical exam. This is also called an annual well check.  Dental exams once or twice a year.  Routine eye exams. Ask your health care provider how often you should have your eyes checked.  Personal lifestyle choices,  including:  Daily care of your teeth and gums.  Regular physical activity.  Eating a healthy diet.  Avoiding tobacco and drug use.  Limiting alcohol use.  Practicing safe sex.  Taking low doses of aspirin every day.  Taking vitamin and mineral supplements as recommended by your health care provider. What happens during an annual well check? The services and screenings done by your health care provider during your annual well check will depend on your age, overall health, lifestyle risk factors, and family history of disease. Counseling  Your health care provider may ask you questions about your:  Alcohol use.  Tobacco use.  Drug use.  Emotional well-being.  Home and relationship well-being.  Sexual activity.  Eating habits.  History of falls.  Memory and ability to understand (cognition).  Work and work Statistician. Screening  You may have the following tests or measurements:  Height, weight, and BMI.  Blood pressure.  Lipid and cholesterol levels. These may be checked every 5 years, or more frequently if you are over 61 years old.  Skin check.  Lung cancer screening. You may have this screening every year starting at age 76 if you have a 30-pack-year history of smoking and currently smoke or have quit within the past 15 years.  Fecal occult blood test (FOBT) of the stool. You may have this test every year starting at age 17.  Flexible sigmoidoscopy or colonoscopy. You may have a sigmoidoscopy every 5 years or a colonoscopy every 10 years starting at age 60.  Prostate cancer screening. Recommendations will vary depending on your family history and other risks.  Hepatitis C blood test.  Hepatitis B blood test.  Sexually transmitted disease (STD) testing.  Diabetes screening. This is done by checking your blood sugar (glucose) after you have not eaten for a while (fasting). You may have this done every 1-3 years.  Abdominal aortic aneurysm (AAA)  screening. You may need this if you are a current or former smoker.  Osteoporosis. You may be screened starting at age 21 if you are at high risk. Talk with your health care provider about your test results, treatment options, and if necessary, the need for more tests. Vaccines  Your health care provider may recommend certain vaccines, such as:  Influenza vaccine. This is recommended every year.  Tetanus, diphtheria, and acellular pertussis (Tdap, Td) vaccine. You may need a Td booster every 10 years.  Zoster vaccine. You may need this after age 74.  Pneumococcal 13-valent conjugate (PCV13) vaccine. One dose is recommended after age 85.  Pneumococcal polysaccharide (PPSV23) vaccine. One dose is recommended after age 35. Talk to your health care provider about which screenings and vaccines you need and how often you need them. This information is not intended to replace advice given to you by your health care provider. Make sure you discuss any questions you have with your health care provider. Document Released: 09/07/2015 Document Revised: 04/30/2016 Document Reviewed: 06/12/2015 Elsevier Interactive Patient Education  2017 [place] Prevention in the Home Falls can cause injuries. They can happen to people of all ages. There are many things you can do to make your home safe and to help prevent falls. What can I do on the outside of my home?  Regularly fix the edges of walkways and driveways and fix any cracks.  Remove anything that might make you trip as you walk through a door, such as a raised step or threshold.  Trim any bushes or trees on the path to your home.  Use bright outdoor lighting.  Clear any walking paths of anything that might make someone trip, such as rocks or tools.  Regularly check to see if handrails are loose or broken. Make sure that both sides of any steps have handrails.  Any raised decks and porches should have guardrails on the  edges.  Have any leaves, snow, or ice cleared regularly.  Use sand or salt on walking paths during winter.  Clean up any spills in your garage right away. This includes oil or grease spills. What can I do in the bathroom?  Use night lights.  Install grab bars by the toilet and in the tub and shower. Do not use towel bars as grab bars.  Use non-skid mats or decals in the tub or shower.  If you need to sit down in the shower, use a plastic, non-slip stool.  Keep the floor dry. Clean up any water that spills on the floor as soon as it happens.  Remove soap buildup in the tub or shower regularly.  Attach bath mats securely with double-sided non-slip rug tape.  Do not have throw rugs and other things on the floor that can make you trip. What can I do in the bedroom?  Use night lights.  Make sure that you have a light by your bed that is easy to reach.  Do not use any sheets or blankets that are too big for your bed. They should not hang down onto the floor.  Have a firm chair that has side arms. You can use this for support while you get dressed.  Do not  have throw rugs and other things on the floor that can make you trip. What can I do in the kitchen?  Clean up any spills right away.  Avoid walking on wet floors.  Keep items that you use a lot in easy-to-reach places.  If you need to reach something above you, use a strong step stool that has a grab bar.  Keep electrical cords out of the way.  Do not use floor polish or wax that makes floors slippery. If you must use wax, use non-skid floor wax.  Do not have throw rugs and other things on the floor that can make you trip. What can I do with my stairs?  Do not leave any items on the stairs.  Make sure that there are handrails on both sides of the stairs and use them. Fix handrails that are broken or loose. Make sure that handrails are as long as the stairways.  Check any carpeting to make sure that it is firmly  attached to the stairs. Fix any carpet that is loose or worn.  Avoid having throw rugs at the top or bottom of the stairs. If you do have throw rugs, attach them to the floor with carpet tape.  Make sure that you have a light switch at the top of the stairs and the bottom of the stairs. If you do not have them, ask someone to add them for you. What else can I do to help prevent falls?  Wear shoes that:  Do not have high heels.  Have rubber bottoms.  Are comfortable and fit you well.  Are closed at the toe. Do not wear sandals.  If you use a stepladder:  Make sure that it is fully opened. Do not climb a closed stepladder.  Make sure that both sides of the stepladder are locked into place.  Ask someone to hold it for you, if possible.  Clearly mark and make sure that you can see:  Any grab bars or handrails.  First and last steps.  Where the edge of each step is.  Use tools that help you move around (mobility aids) if they are needed. These include:  Canes.  Walkers.  Scooters.  Crutches.  Turn on the lights when you go into a dark area. Replace any light bulbs as soon as they burn out.  Set up your furniture so you have a clear path. Avoid moving your furniture around.  If any of your floors are uneven, fix them.  If there are any pets around you, be aware of where they are.  Review your medicines with your doctor. Some medicines can make you feel dizzy. This can increase your chance of falling. Ask your doctor what other things that you can do to help prevent falls. This information is not intended to replace advice given to you by your health care provider. Make sure you discuss any questions you have with your health care provider. Document Released: 06/07/2009 Document Revised: 01/17/2016 Document Reviewed: 09/15/2014 Elsevier Interactive Patient Education  2017 [employer].

## 2018-05-03 ENCOUNTER — Ambulatory Visit: Payer: Medicare Other | Admitting: Family Medicine

## 2018-05-03 ENCOUNTER — Encounter: Payer: Self-pay | Admitting: Family Medicine

## 2018-05-03 DIAGNOSIS — N183 Chronic kidney disease, stage 3 unspecified: Secondary | ICD-10-CM

## 2018-05-03 DIAGNOSIS — E1121 Type 2 diabetes mellitus with diabetic nephropathy: Secondary | ICD-10-CM

## 2018-05-03 DIAGNOSIS — Z5181 Encounter for therapeutic drug level monitoring: Secondary | ICD-10-CM

## 2018-05-03 DIAGNOSIS — I10 Essential (primary) hypertension: Secondary | ICD-10-CM

## 2018-05-03 DIAGNOSIS — E1165 Type 2 diabetes mellitus with hyperglycemia: Secondary | ICD-10-CM

## 2018-05-03 DIAGNOSIS — IMO0002 Reserved for concepts with insufficient information to code with codable children: Secondary | ICD-10-CM

## 2018-05-03 DIAGNOSIS — E785 Hyperlipidemia, unspecified: Secondary | ICD-10-CM

## 2018-05-03 LAB — LIPID PANEL
CHOL/HDL RATIO: 3.5 (calc) (ref <5.0)
CHOLESTEROL: 227 mg/dL — AB (ref <200)
HDL: 65 mg/dL (ref >40)
LDL CHOLESTEROL (CALC): 143 mg/dL — AB
Non-HDL Cholesterol (Calc): 162 mg/dL (calc) — ABNORMAL HIGH (ref <130)
TRIGLYCERIDES: 85 mg/dL (ref <150)

## 2018-05-03 LAB — COMPLETE METABOLIC PANEL WITH GFR
AG RATIO: 1.3 (calc) (ref 1.0–2.5)
ALT: 9 U/L (ref 9–46)
AST: 18 U/L (ref 10–35)
Albumin: 3.7 g/dL (ref 3.6–5.1)
Alkaline phosphatase (APISO): 43 U/L (ref 40–115)
BUN/Creatinine Ratio: 14 (calc) (ref 6–22)
BUN: 21 mg/dL (ref 7–25)
CALCIUM: 9.3 mg/dL (ref 8.6–10.3)
CHLORIDE: 104 mmol/L (ref 98–110)
CO2: 26 mmol/L (ref 20–32)
CREATININE: 1.5 mg/dL — AB (ref 0.70–1.11)
GFR, EST NON AFRICAN AMERICAN: 43 mL/min/{1.73_m2} — AB (ref >=60)
GFR, Est African American: 50 mL/min/{1.73_m2} — ABNORMAL LOW (ref >=60)
GLOBULIN: 2.9 g/dL (ref 1.9–3.7)
Glucose, Bld: 132 mg/dL (ref 65–139)
POTASSIUM: 4.3 mmol/L (ref 3.5–5.3)
Sodium: 137 mmol/L (ref 135–146)
TOTAL PROTEIN: 6.6 g/dL (ref 6.1–8.1)
Total Bilirubin: 0.3 mg/dL (ref 0.2–1.2)

## 2018-05-03 LAB — POCT URINALYSIS DIPSTICK
Bilirubin, UA: NEGATIVE
Glucose, UA: POSITIVE — AB
Ketones, UA: NEGATIVE
Leukocytes, UA: NEGATIVE
NITRITE UA: NEGATIVE
Odor: NORMAL
PROTEIN UA: NEGATIVE
RBC UA: NEGATIVE
SPEC GRAV UA: 1.015 (ref 1.010–1.025)
Urobilinogen, UA: NEGATIVE E.U./dL — AB
pH, UA: 5 (ref 5.0–8.0)

## 2018-05-03 LAB — HEMOGLOBIN A1C
Hgb A1c MFr Bld: 8.7 % of total Hgb — ABNORMAL HIGH (ref <5.7)
Mean Plasma Glucose: 203 (calc)
eAG (mmol/L): 11.2 (calc)

## 2018-05-03 MED ORDER — INSULIN DETEMIR 100 UNIT/ML FLEXPEN: 15.0000 [IU] | PEN_INJECTOR | Freq: Every day | SUBCUTANEOUS | 2 refills | Status: DC

## 2018-05-03 NOTE — Assessment & Plan Note (Signed)
Monitor liver and kidneys periodically

## 2018-05-03 NOTE — Progress Notes (Signed)
BP 132/68   Pulse 92   Temp 98.5 F (36.9 C)   Ht 5\' 11"  (1.803 m)   Wt 176 lb 11.2 oz (80.2 kg)   SpO2 99%   BMI 24.64 kg/m    Subjective:    Patient ID: Brian [name], male    DOB: [birth year], 84 y.o.   MRN: [id number]  HPI: Brian [name] is a 84 y.o. male  Chief Complaint  Patient presents with  . Follow-up    HPI He is here for f/u Type 2 diabetes; on 33 units of insulin, taking that at night; he thinks 33 was too much for him; he is checking sugars 4 times a day; he has decreased it to 22 units; FSBS this morning was 86 this morning He will decrease it even further to prevent low sugars; eating well; no new numbness in the feet He denies [facility] or fast heart rate Urine microalb:Cr normal in June; he brought in a urine that he wanted checked though but not having any symptoms (?) Lab Results  Component Value Date   HGBA1C 8.5 (H) 01/29/2018   High cholesterol; trying to stay away from fatty foods; he is concerned about crestor and will not take it Last LDL was 142; taking crestor just one night a week No chest pain  CKD stage 3; GFR dropped from 62 to 52 last check; he is not taking NSAIDs, just 81 mg aspirin  He got flu shot  HTN; controlled; not adding salt to food  Depression screen [facility] 2/9 05/03/2018 04/30/2018 02/22/2018 12/08/2017 11/04/2017  Decreased Interest 0 0 0 0 0  Down, Depressed, Hopeless 0 0 0 0 0  PHQ - 2 Score 0 0 0 0 0  Altered sleeping - 0 - - -  Tired, decreased energy - 0 - - -  Change in appetite - 0 - - -  Feeling bad or failure about yourself  - 0 - - -  Trouble concentrating - 0 - - -  Moving slowly or fidgety/restless - 0 - - -  Suicidal thoughts - 0 - - -  PHQ-9 Score - 0 - - -  Difficult doing work/chores - Not difficult at all - - -    Relevant past medical, surgical, family and social history reviewed Past Medical History:  Diagnosis Date  . Anemia of chronic renal failure 05/08/2015  . Aortic stenosis, mild 09/21/2015  .  Arthritis   . Bladder stone   . Chronic kidney disease (CKD), stage III (moderate) ([place]) 05/08/2015   Followed by Dr. Rolly Salter   . Diastolic dysfunction without heart failure 06/08/2017  . History of bladder stone   . Hyperlipidemia   . Hypertension   . Nocturia   . Renal disorder   . Type 2 diabetes mellitus ([place])    Past Surgical History:  Procedure Laterality Date  . CYSTOSCOPY/RETROGRADE/URETEROSCOPY/STONE EXTRACTION WITH BASKET N/A 03/22/2013   Procedure: CYSTOSCOPY BLADDER STONE STONE EXTRACTION;  Surgeon: [name], MD;  Location: [facility];  Service: Urology;  Laterality: N/A;  CYSTOSCOPY    . TONSILLECTOMY     Family History  Problem Relation Age of Onset  . Congestive Heart Failure Unknown   . Diabetes Brother    Social History   Tobacco Use  . Smoking status: Former Smoker    Packs/day: 0.75    Years: 39.00    Pack years: 29.25    Types: Cigarettes    Last attempt to quit: 03/21/1993  Years since quitting: 25.1  . Smokeless tobacco: Never Used  . Tobacco comment: smoking cessation materials not required  Substance Use Topics  . Alcohol use: No    Alcohol/week: 0.0 standard drinks  . Drug use: No    Interim medical history since last visit reviewed. Allergies and medications reviewed  Review of Systems  Respiratory: Negative for shortness of breath.   Cardiovascular: Negative for chest pain.   Per HPI unless specifically indicated above     Objective:    BP 132/68   Pulse 92   Temp 98.5 F (36.9 C)   Ht 5\' 11"  (1.803 m)   Wt 176 lb 11.2 oz (80.2 kg)   SpO2 99%   BMI 24.64 kg/m   Wt Readings from Last 3 Encounters:  05/03/18 176 lb 11.2 oz (80.2 kg)  04/30/18 176 lb 1.6 oz (79.9 kg)  02/22/18 174 lb 1.6 oz (79 kg)    Physical Exam  Constitutional: He appears well-developed and well-nourished. No distress.  HENT:  Head: Normocephalic and atraumatic.  Eyes: EOM are normal. No scleral icterus.  Neck: No thyromegaly  present.  Cardiovascular: Normal rate and regular rhythm.  Pulmonary/Chest: Effort normal and breath sounds normal.  Abdominal: Soft. Bowel sounds are normal. He exhibits no distension.  Musculoskeletal: He exhibits no edema.  Neurological: Coordination normal.  Skin: Skin is warm and dry. No pallor.  Psychiatric: He has a normal mood and affect. His behavior is normal. Judgment and thought content normal.   Diabetic Foot Form - Detailed   Diabetic Foot Exam - detailed Diabetic Foot exam was performed with the following findings:  Yes 05/03/2018  8:12 AM  Visual Foot Exam completed.:  Yes  Pulse Foot Exam completed.:  Yes  Sensory Foot Exam Completed.:  Yes Semmes-Weinstein Monofilament Test       Results for orders placed or performed in visit on 05/03/18  POCT Urinalysis Dipstick  Result Value Ref Range   Color, UA yellow    Clarity, UA clear    Glucose, UA Positive (A) Negative   Bilirubin, UA negative    Ketones, UA negative    Spec Grav, UA 1.015 1.010 - 1.025   Blood, UA negative    pH, UA 5.0 5.0 - 8.0   Protein, UA Negative Negative   Urobilinogen, UA negative (A) 0.2 or 1.0 E.U./dL   Nitrite, UA negative    Leukocytes, UA Negative Negative   Appearance clear    Odor normal       Assessment & Plan:   Problem List Items Addressed This Visit      Cardiovascular and Mediastinum   Hypertension goal BP (blood pressure) < 140/90 (Chronic)    Good control today; continue medicines, limit salt        Endocrine   Uncontrolled type 2 diabetes mellitus with diabetic nephropathy ([place])    He has adjusted his insulin; monitor A1c today; foot exam by MD today      Relevant Medications   Insulin Detemir (LEVEMIR FLEXTOUCH) 100 UNIT/ML Pen   Other Relevant Orders   Hemoglobin A1c   COMPLETE METABOLIC PANEL WITH GFR     Genitourinary   Chronic kidney disease (CKD), stage III (moderate) (HCC) (Chronic)    Avoiding NSAIDs; monitor Cr        Other   Hyperlipidemia  LDL goal <100 (Chronic)    Check LDL and see how just one pill a week is working; advised one pill weekly may be just enough to  reduce risk of heart disease      Relevant Orders   Lipid panel   Encounter for medication monitoring    Monitor liver and kidneys periodically      Relevant Orders   COMPLETE METABOLIC PANEL WITH GFR   POCT Urinalysis Dipstick (Completed)       Follow up plan: Return in about 3 months (around 08/02/2018) for follow-up visit with Dr. Sanda Klein (you do not need to fast).  An after-visit summary was printed and given to the patient at [facility].  Please see the patient instructions which may contain other information and recommendations beyond what is mentioned above in the assessment and plan.  Meds ordered this encounter  Medications  . Insulin Detemir (LEVEMIR FLEXTOUCH) 100 UNIT/ML Pen    Sig: Inject 15 Units into the skin daily.    Dispense:  15 mL    Refill:  2    Delete old refills; NO AUTOMATIC REFILLS please    Orders Placed This Encounter  Procedures  . Lipid panel  . Hemoglobin A1c  . COMPLETE METABOLIC PANEL WITH GFR  . POCT Urinalysis Dipstick

## 2018-05-03 NOTE — Assessment & Plan Note (Signed)
Check LDL and see how just one pill a week is working; advised one pill weekly may be just enough to reduce risk of heart disease

## 2018-05-03 NOTE — Patient Instructions (Signed)
Keep me posted on the lower dose of insulin Please do see your eye doctor regularly, and have your eyes examined every year (or more often per his or her recommendation) Check your feet every night and let me know right away of any sores, infections, numbness, etc. Try to limit sweets, white bread, white rice, white potatoes If you need something for aches or pains, try to use Tylenol (acetaminophen) instead of non-steroidals (which include Aleve, ibuprofen, Advil, Motrin, and naproxen); non-steroidals can cause long-term kidney damage

## 2018-05-03 NOTE — Assessment & Plan Note (Signed)
Good control today; continue medicines, limit salt

## 2018-05-03 NOTE — Assessment & Plan Note (Signed)
Avoiding NSAIDs; monitor Cr

## 2018-05-03 NOTE — Assessment & Plan Note (Signed)
He has adjusted his insulin; monitor A1c today; foot exam by MD today

## 2018-05-04 ENCOUNTER — Other Ambulatory Visit: Payer: Self-pay | Admitting: Family Medicine

## 2018-05-05 NOTE — Telephone Encounter (Signed)
GFR 50, normal K+ Rx approved

## 2018-05-06 ENCOUNTER — Other Ambulatory Visit: Payer: Self-pay | Admitting: Family Medicine

## 2018-05-06 MED ORDER — DULAGLUTIDE 0.75 MG/0.5ML ~~LOC~~ SOAJ: SUBCUTANEOUS | 5 refills | Status: DC

## 2018-05-06 NOTE — Progress Notes (Signed)
Ask pt if we can increase statin Start GLP-1

## 2018-05-07 ENCOUNTER — Telehealth: Payer: Self-pay

## 2018-05-07 ENCOUNTER — Telehealth: Payer: Self-pay | Admitting: Family Medicine

## 2018-05-07 DIAGNOSIS — IMO0002 Reserved for concepts with insufficient information to code with codable children: Secondary | ICD-10-CM

## 2018-05-07 DIAGNOSIS — E1121 Type 2 diabetes mellitus with diabetic nephropathy: Secondary | ICD-10-CM

## 2018-05-07 DIAGNOSIS — N183 Chronic kidney disease, stage 3 unspecified: Secondary | ICD-10-CM

## 2018-05-07 DIAGNOSIS — E1165 Type 2 diabetes mellitus with hyperglycemia: Principal | ICD-10-CM

## 2018-05-07 DIAGNOSIS — I10 Essential (primary) hypertension: Secondary | ICD-10-CM

## 2018-05-07 MED ORDER — ROSUVASTATIN CALCIUM 5 MG PO TABS: ORAL_TABLET | ORAL | 1 refills | Status: DC

## 2018-05-07 NOTE — Telephone Encounter (Signed)
Please refer him to CCM here to see the pharmacist right away

## 2018-05-07 NOTE — Telephone Encounter (Signed)
-----   Message from [name], [place] sent at 05/07/2018 10:37 AM EDT ----- Called patient. He is confused about his Levemir. He reports that he is taking 15-20 units of Levemir and he is having sweats when he wakes up. Patient seems really confused about what and how is suppose to be taking his medication. He would like for you to call him back.

## 2018-05-07 NOTE — Telephone Encounter (Signed)
Brian [name] and his wife have agreed to start taking the statin two or three times a week. She would like to help him with his diet before he starts the Trulicity.

## 2018-05-11 ENCOUNTER — Ambulatory Visit: Payer: Self-pay | Admitting: *Deleted

## 2018-05-11 ENCOUNTER — Ambulatory Visit: Payer: Self-pay | Admitting: Pharmacist

## 2018-05-11 NOTE — Progress Notes (Signed)
Erroneous entry

## 2018-05-11 NOTE — Chronic Care Management (AMB) (Signed)
  Chronic Care Management   Note  05/11/2018 Name: Brian [name] MRN: [id number] DOB: [birth year]  Noted new referral for pharmcy needs. Clinic Pharmacist notified.   [facility] Management  [phone]

## 2018-05-11 NOTE — Chronic Care Management (AMB) (Signed)
  Chronic Care Management   Note  05/11/2018 Name: Brian [name] MRN: [id number] DOB: [birth year]   Brian [name] is a 84 y.o. year old male who sees [name], [name], MD for primary care. Dr. Sanda Klein asked the CCM team to consult the patient for assistance with chronic disease management related to insulin management and diabetes education. Chronic diseases include HTN, diabetes type 2, IBS, aortic stenosis, HLD. Referral was placed 05/07/18. Telephone outreach to patient today to introduce CCM services.   [name] agreed that CCM services would be helpful to them.   Plan: I have scheduled an initial CCM office visit with pharmacist and nurse case manager with Brian [name] for tomorrow, 05/12/18 at [place] was given information about Chronic Care Management services today including:  1. CCM service includes personalized support from designated clinical staff supervised by her physician, including individualized plan of care and coordination with other care providers 2. 24/7 contact phone numbers for assistance for urgent and routine care needs. 3. Service will only be billed when office clinical staff spend 20 minutes or more in a month to coordinate care. 4. Only one practitioner may furnish and bill the service in a calendar month. 5. The patient may stop CCM services at any time (effective at the end of the month) by phone call to the office staff. 6. The patient will be responsible for cost sharing (co-pay) of up to 20% of the service fee (after annual deductible is met).   Patient agreed to services and verbal consent obtained.  Brian [name], PharmD Clinical Pharmacist [facility] [phone]

## 2018-05-11 NOTE — Chronic Care Management (AMB) (Deleted)
  Chronic Care Management   Note  05/11/2018 Name: Brian [name] MRN: [id number] DOB: [birth year]   Brian [name] is a 84 y.o. year old male who sees [name], [name], MD for primary care. *** asked the CCM team to consult the patient for assistance with chronic disease management related to ***. Referral was placed ***. Telephone outreach to patient today to introduce CCM services.   *** agreed that CCM services would be helpful to them.   Plan: I have scheduled a call to Brian [name] next week. He/She has agreed to begin CCM services ***.    Brian [name] was given information about Chronic Care Management services today including:  1. CCM service includes personalized support from designated clinical staff supervised by her physician, including individualized plan of care and coordination with other care providers 2. 24/7 contact phone numbers for assistance for urgent and routine care needs. 3. Service will only be billed when office clinical staff spend 20 minutes or more in a month to coordinate care. 4. Only one practitioner may furnish and bill the service in a calendar month. 5. The patient may stop CCM services at any time (effective at the end of the month) by phone call to the office staff. 6. The patient will be responsible for cost sharing (co-pay) of up to 20% of the service fee (after annual deductible is met).   Patient agreed to services and verbal consent obtained.  Ruben Reason, PharmD Clinical Pharmacist [facility] [phone]

## 2018-05-12 ENCOUNTER — Ambulatory Visit (INDEPENDENT_AMBULATORY_CARE_PROVIDER_SITE_OTHER): Payer: Medicare Other

## 2018-05-12 DIAGNOSIS — IMO0002 Reserved for concepts with insufficient information to code with codable children: Secondary | ICD-10-CM

## 2018-05-12 DIAGNOSIS — I10 Essential (primary) hypertension: Secondary | ICD-10-CM

## 2018-05-12 DIAGNOSIS — E785 Hyperlipidemia, unspecified: Secondary | ICD-10-CM | POA: Diagnosis not present

## 2018-05-12 DIAGNOSIS — E1165 Type 2 diabetes mellitus with hyperglycemia: Secondary | ICD-10-CM

## 2018-05-12 DIAGNOSIS — E1121 Type 2 diabetes mellitus with diabetic nephropathy: Secondary | ICD-10-CM | POA: Diagnosis not present

## 2018-05-12 NOTE — Patient Instructions (Addendum)
1. You're doing a GREAT job managing your blood sugar! Keep up the good work!   2. Have a snack around 9pm every night (1/2 pack of nabs, a few cheese crackers)  3. Take 15 units of Levimir insulin at night.   4. If you feel bad in the evening, check your sugar. If it is less than 100, have a little more to eat.  5. If you start having blood sugar readings in the morning that are 130 or more  OR less than 100, call Keneth Borg or Almyra Free and we will help you sort out any changes.   6. Take your Crestor (cholesterol medicine) on Monday, Wednesday, and Friday at night.    Call your team if you have any questions!    CCM (Chronic Care Management) Team   [facility] Nurse Care Coordinator  [phone]   Ruben Reason PharmD  Clinical Pharmacist  [phone]    Brian [name] was given information about Chronic Care Management services today including:  1. CCM service includes personalized support from designated clinical staff supervised by his physician, including individualized plan of care and coordination with other care providers 2. 24/7 contact phone numbers for assistance for urgent and routine care needs. 3. Service will only be billed when office clinical staff spend 20 minutes or more in a month to coordinate care. 4. Only one practitioner may furnish and bill the service in a calendar month. 5. The patient may stop CCM services at any time (effective at the end of the month) by phone call to the office staff. 6. The patient will be responsible for cost sharing (co-pay) of up to 20% of the service fee (after annual deductible is met).  Patient agreed to services and verbal consent obtained.

## 2018-05-12 NOTE — Chronic Care Management (AMB) (Cosign Needed)
  Chronic Care Management   Initial Visit Note  05/12/2018 Name: Brian [name] MRN: [id number] DOB: 1934/04/23  Referred by: Arnetha Courser, MD Reason for referral : Chronic Care Management (Initial OV (DM))   Subjective: "I need to make sure I'm doing this insulin right"  Objective: There were no vitals taken for this visit.       (metrics related to reason for visit: ex.  - CBG OR HgA1C OR weight, etc..)   Assessment: (brief patient description)   Problem 1:  Goals:  Clinical Goals:  Interventions:      Problem 2:  Goals:  Clinical Goals:  Interventions:     Plan:    Brian [name] was given information about Chronic Care Management services today including:  1. CCM service includes personalized support from designated clinical staff supervised by his physician, including individualized plan of care and coordination with other care providers 2. 24/7 contact phone numbers for assistance for urgent and routine care needs. 3. Service will only be billed when office clinical staff spend 20 minutes or more in a month to coordinate care. 4. Only one practitioner may furnish and bill the service in a calendar month. 5. The patient may stop CCM services at any time (effective at the end of the month) by phone call to the office staff. 6. The patient will be responsible for cost sharing (co-pay) of up to 20% of the service fee (after annual deductible is met).  Patient agreed to services and verbal consent obtained.  Provider Signature

## 2018-05-15 ENCOUNTER — Other Ambulatory Visit: Payer: Self-pay | Admitting: Family Medicine

## 2018-05-25 ENCOUNTER — Ambulatory Visit: Payer: Self-pay

## 2018-05-31 NOTE — Chronic Care Management (AMB) (Signed)
Chronic Care Management   Note  05/31/2018 Name: Brian [name] MRN: [id number] DOB: [birth year]   Subjective: "I would like some help with my diabetes" Objective: Medications Reviewed Today    Reviewed by Cathi Roan, [facility] (Pharmacist) on 05/12/18 at [place] List Status: <None>  Medication Order Taking? Sig Documenting Provider Last Dose Status Informant  amLODipine (NORVASC) 10 MG tablet [id number] Yes Take 1 tablet (10 mg total) by mouth daily. (for high blood pressure) [name], [name], MD Taking Active   aspirin EC 81 MG tablet [id number]  Take 81 mg by mouth daily. Reported on 09/17/2015 [provider]  Active Self           Med Note Vertell Limber Jul 18, 2015  9:26 AM) Dewaine Conger whenever he thinks about it  Blood Glucose Monitoring Suppl (ONE TOUCH ULTRA 2) W/DEVICE KIT [id number] Yes  [provider] Taking Active Self           Med Note Gwenith Daily, LATISHA A   Tue May 08, 2015  8:46 AM) Received from: External Pharmacy  docusate sodium (DOK) 100 MG capsule [id number] Yes Take 1 capsule (100 mg total) by mouth 2 (two) times daily as needed. Arnetha Courser, MD Taking Active   Dulaglutide (TRULICITY) 3.24 MW/1.0UV SOPN [id number] No Give 0.5 ml once a week subcutaneously to help control sugars  Patient not taking:  Reported on 05/12/2018   Arnetha Courser, MD Not Taking Active   finasteride (PROSCAR) 5 MG tablet [id number] Yes TAKE ONE (1) TABLET EACH DAY [name], [name], MD Taking Active   glucose blood (ONE TOUCH ULTRA TEST) test strip [id number] Yes CHECK FINGERSTICK BLOOD SUGAR LEVEL THREE TIMES A DAY AS DIRECTED; E11.29; LON 99 months [name], [name], MD Taking Active   Insulin Detemir (LEVEMIR FLEXTOUCH) 100 UNIT/ML Pen [id number] Yes Inject 15 Units into the skin daily. Arnetha Courser, MD Taking Active            Med Note Kary Kos, Blair Heys   Wed May 12, 2018  9:21 AM) Patient uses a sort of sliding scale when his sugars are high  Insulin Pen Needle (B-D UF  III MINI PEN NEEDLES) 31G X 5 MM MISC [id number] Yes USE AS DIRECTED WITH LEVEMIR INSULIN PENS AT BEDTIME, once a day [name], [name], MD Taking Active   nystatin-triamcinolone (MYCOLOG II) cream [id number]  APPLY TO AFFECTED AREAS 3 TIMES DAILY ASNEEDED Arnetha Courser, MD  Active   Omega-3 Fatty Acids (FISH OIL) 1000 MG CAPS [id number] Yes Take 1 capsule (1,000 mg total) by mouth daily. Skeet Latch, MD Taking Active   pioglitazone (ACTOS) 15 MG tablet [id number] Yes Take 1 tablet (15 mg total) by mouth daily. Arnetha Courser, MD Taking Active   quinapril (ACCUPRIL) 20 MG tablet [id number] Yes TAKE 1 TABLET BY MOUTH TWICE A DAY [name], [name], MD Taking Active   rosuvastatin (CRESTOR) 5 MG tablet [id number] Yes One by mouth just three nights a week Arnetha Courser, MD Taking Active            Med Note Kary Kos, Blair Heys   Wed May 12, 2018  9:04 AM) Dewaine Conger once a week- Mondays  sildenafil (VIAGRA) 100 MG tablet [id number] No Take 0.5-1 tablets (50-100 mg total) by mouth daily as needed for erectile dysfunction. Arnetha Courser, MD Unknown Active   tamsulosin (FLOMAX) 0.4 MG CAPS capsule [id number] Yes TAKE ONE CAPSULE BY MOUTH DAILY [name],  [name], MD Taking Active            Goals Addressed            This Visit's Progress   . "I would like to get better control of my diabetes"        Clinical Goal(s): Over the next 4 weeks, patient will apply for Trulicity medication assistance program with the help of CCM pharmacist  Over the next 4 weeks, check blood sugars twice a day in the morning before eating and before bed.   Interventions: Fill out [employer]; provide CCM pharmacist with appropriate paperwork; Check blood sugars regularly and write down readings.         Pharmacy Medication Therapy Review  Does the patient  feel that his/her medications are working for him/her?  yes  Has the patient been experiencing any side effects to the medications prescribed?   yes  Does the patient measure his/her own blood pressure or blood glucose at home?  yes   Does the patient have any problems obtaining medications due to transportation or finances?   yes  Understanding of regimen: fair Understanding of indications: fair Potential of compliance: poor   Total Number of meds:  13 (polypharmacy > 10 meds)  Indications for all medications: _0  Yes       _1  No  Adherence Review  _2  Excellent (no doses missed/week)     _3  Good (no more than 1 dose missed/week)     _4  Partial (2-3 doses missed/week)     _5  Poor (>3 doses missed/week)  Assessment: (Medication related problems)  Intervention  YES NO  Explanation  Efficacy      Suboptimal drug or dose selection _6  _7    Insufficient dose/duration _8  _9    Failure to receive therapy  (Rx not filled) _10  _11     Safety      Adverse drug event _12  _13  Uses "sliding scale" of Levemir- dangerous for elderly   Drug interaction _14  _15     Excessive dose/duration _16  _17    High-risk medications _18  _19    Compliance     Underuse _20  _21  Statin adherence: takes only once a week   Overuse _22  _23    Other pertinent pharmacist counseling _24  _25      Time:  Time spent counseling patient: 60 Additional time spent on charting: 10   PLAN:  Consider the following at discharge/Recommendations discussed with provider  - Change rosuvastatin sig to match how patient actually takes medication for better adherence -Apply for Trulicity MAP ([place])  Ruben Reason, PharmD Clinical Pharmacist [facility] [phone]

## 2018-06-22 ENCOUNTER — Telehealth: Payer: Self-pay

## 2018-06-24 ENCOUNTER — Telehealth: Payer: Self-pay | Admitting: Pharmacist

## 2018-06-26 ENCOUNTER — Other Ambulatory Visit: Payer: Self-pay | Admitting: Family Medicine

## 2018-08-02 ENCOUNTER — Ambulatory Visit: Payer: Medicare Other | Admitting: Family Medicine

## 2018-08-02 ENCOUNTER — Encounter: Payer: Self-pay | Admitting: Family Medicine

## 2018-08-02 DIAGNOSIS — E785 Hyperlipidemia, unspecified: Secondary | ICD-10-CM

## 2018-08-02 DIAGNOSIS — E1121 Type 2 diabetes mellitus with diabetic nephropathy: Secondary | ICD-10-CM

## 2018-08-02 DIAGNOSIS — I350 Nonrheumatic aortic (valve) stenosis: Secondary | ICD-10-CM | POA: Diagnosis not present

## 2018-08-02 DIAGNOSIS — N183 Chronic kidney disease, stage 3 unspecified: Secondary | ICD-10-CM

## 2018-08-02 DIAGNOSIS — IMO0002 Reserved for concepts with insufficient information to code with codable children: Secondary | ICD-10-CM

## 2018-08-02 DIAGNOSIS — E1165 Type 2 diabetes mellitus with hyperglycemia: Secondary | ICD-10-CM

## 2018-08-02 DIAGNOSIS — Z5181 Encounter for therapeutic drug level monitoring: Secondary | ICD-10-CM

## 2018-08-02 NOTE — Assessment & Plan Note (Signed)
Seeing Dr. Oval Linsey in January

## 2018-08-02 NOTE — Assessment & Plan Note (Signed)
Avoid NSAIDs; stay well-hydrated; see nephrologist regularly

## 2018-08-02 NOTE — Assessment & Plan Note (Signed)
Expect a1c to have improved since last check with his FSBS improved; try to stay avoid sugary drinks; foot exam by MD today; he is overdue for eye exam and he has already made the appt, next week; asked patient to have eye doctor send [place] a copy of the visit

## 2018-08-02 NOTE — Patient Instructions (Signed)
Try to limit saturated fats in your diet (bologna, hot dogs, barbeque, cheeseburgers, hamburgers, steak, bacon, sausage, cheese, etc.) and get more fresh fruits, vegetables, and whole grains Try to follow the DASH guidelines (DASH stands for Dietary Approaches to Stop Hypertension). Try to limit the sodium in your diet to no more than 1,500mg  of sodium per day. Certainly try to not exceed 2,000 mg per day at the very most. Do not add salt when cooking or at the table.  Check the sodium amount on labels when shopping, and choose items lower in sodium when given a choice. Avoid or limit foods that already contain a lot of sodium. Eat a diet rich in fruits and vegetables and whole grains, and try to lose weight if overweight or obese

## 2018-08-02 NOTE — Progress Notes (Signed)
BP 108/62   Pulse 89   Temp 98.3 F (36.8 C)   Ht 5\' 11"  (1.803 m)   Wt 168 lb 14.4 oz (76.6 kg)   SpO2 99%   BMI 23.56 kg/m    Subjective:    Patient ID: Brian [name], male    DOB: 1934-02-09, 84 y.o.   MRN: [id number]  HPI: Brian [name] is a 84 y.o. male  Chief Complaint  Patient presents with  . Follow-up    HPI Patient is here for f/u  Type 2 diabetes; was checking FSBS 3x a day, now 2x a day 111 this morning; no low sugars; 85 to 129 range for the month of December Working with chronic care management team; helping him with diabetes Urine microalb:cr normal in June  Lab Results  Component Value Date   HGBA1C 8.7 (H) 05/03/2018    Hypertension; BP controlled; he does not feel dizzy or light-headed; tries to stay away from salt; no amlodipine and ACE-I  Seeing Dr. Oval Linsey January 17th; no chest pain; aortic stenosis  CKD stage 3; seeing Dr. Juleen China, nephrologist; avoiding NSAIDs;   High cholesterol; on statin and fish oil; no problems with that now that we have cut the dose to just 3 nights a week; avoiding saturated fats  Lab Results  Component Value Date   CHOL 227 (H) 05/03/2018   HDL 65 05/03/2018   LDLCALC 143 (H) 05/03/2018   TRIG 85 05/03/2018   CHOLHDL 3.5 05/03/2018     Depression screen PHQ 2/9 08/02/2018 05/03/2018 04/30/2018 02/22/2018 12/08/2017  Decreased Interest 0 0 0 0 0  Down, Depressed, Hopeless 0 0 0 0 0  PHQ - 2 Score 0 0 0 0 0  Altered sleeping 0 - 0 - -  Tired, decreased energy 0 - 0 - -  Change in appetite 0 - 0 - -  Feeling bad or failure about yourself  0 - 0 - -  Trouble concentrating 0 - 0 - -  Moving slowly or fidgety/restless 0 - 0 - -  Suicidal thoughts 0 - 0 - -  PHQ-9 Score 0 - 0 - -  Difficult doing work/chores - - Not difficult at all - -   Fall Risk  08/02/2018 05/03/2018 04/30/2018 02/22/2018 12/08/2017  Falls in the past year? 0 No No No No  Risk for fall due to : - - Impaired vision - -  Risk for fall due to:  Comment - - wears eyeglasses - -    Relevant past medical, surgical, family and social history reviewed Past Medical History:  Diagnosis Date  . Anemia of chronic renal failure 05/08/2015  . Aortic stenosis, mild 09/21/2015  . Arthritis   . Bladder stone   . Chronic kidney disease (CKD), stage III (moderate) ([place]) 05/08/2015   Followed by Dr. Rolly Salter   . Diastolic dysfunction without heart failure 06/08/2017  . History of bladder stone   . Hyperlipidemia   . Hypertension   . Nocturia   . Renal disorder   . Type 2 diabetes mellitus ([place])    Past Surgical History:  Procedure Laterality Date  . CYSTOSCOPY/RETROGRADE/URETEROSCOPY/STONE EXTRACTION WITH BASKET N/A 03/22/2013   Procedure: CYSTOSCOPY BLADDER STONE STONE EXTRACTION;  Surgeon: [name], MD;  Location: [facility];  Service: Urology;  Laterality: N/A;  CYSTOSCOPY    . TONSILLECTOMY     Family History  Problem Relation Age of Onset  . Congestive Heart Failure Unknown   . Diabetes Brother  Social History   Tobacco Use  . Smoking status: Former Smoker    Packs/day: 0.75    Years: 39.00    Pack years: 29.25    Types: Cigarettes    Last attempt to quit: 03/21/1993    Years since quitting: 25.3  . Smokeless tobacco: Never Used  . Tobacco comment: smoking cessation materials not required  Substance Use Topics  . Alcohol use: No    Alcohol/week: 0.0 standard drinks  . Drug use: No     [facility] Visit from 05/03/2018 in [facility]  AUDIT-C Score  0      Interim medical history since last visit reviewed. Allergies and medications reviewed  Review of Systems Per HPI unless specifically indicated above     Objective:    BP 108/62   Pulse 89   Temp 98.3 F (36.8 C)   Ht 5\' 11"  (1.803 m)   Wt 168 lb 14.4 oz (76.6 kg)   SpO2 99%   BMI 23.56 kg/m   Wt Readings from Last 3 Encounters:  08/02/18 168 lb 14.4 oz (76.6 kg)  05/03/18 176 lb 11.2 oz (80.2 kg)  04/30/18 176  lb 1.6 oz (79.9 kg)    Physical Exam  Constitutional: He appears well-developed and well-nourished. No distress.  HENT:  Head: Normocephalic and atraumatic.  Eyes: EOM are normal. No scleral icterus.  Neck: No thyromegaly present.  Cardiovascular: Normal rate and regular rhythm.  Murmur heard. Pulmonary/Chest: Effort normal and breath sounds normal.  Abdominal: Soft. Bowel sounds are normal. He exhibits no distension.  Musculoskeletal: He exhibits no edema.  Neurological: Coordination normal.  Skin: Skin is warm and dry. No pallor.  Psychiatric: He has a normal mood and affect. His behavior is normal. Judgment and thought content normal.    Results for orders placed or performed in visit on 05/03/18  Lipid panel  Result Value Ref Range   Cholesterol 227 (H) <200 mg/dL   HDL 65 >40 mg/dL   Triglycerides 85 <150 mg/dL   LDL Cholesterol (Calc) 143 (H) mg/dL (calc)   Total CHOL/HDL Ratio 3.5 <5.0 (calc)   Non-HDL Cholesterol (Calc) 162 (H) <130 mg/dL (calc)  Hemoglobin A1c  Result Value Ref Range   Hgb A1c MFr Bld 8.7 (H) <5.7 % of total Hgb   Mean Plasma Glucose 203 (calc)   eAG (mmol/L) 11.2 (calc)  COMPLETE METABOLIC PANEL WITH GFR  Result Value Ref Range   Glucose, Bld 132 65 - 139 mg/dL   BUN 21 7 - 25 mg/dL   Creat 1.50 (H) 0.70 - 1.11 mg/dL   GFR, Est Non African American 43 (L) > OR = 60 mL/min/1.57m2   GFR, Est African American 50 (L) > OR = 60 mL/min/1.23m2   BUN/Creatinine Ratio 14 6 - 22 (calc)   Sodium 137 135 - 146 mmol/L   Potassium 4.3 3.5 - 5.3 mmol/L   Chloride 104 98 - 110 mmol/L   CO2 26 20 - 32 mmol/L   Calcium 9.3 8.6 - 10.3 mg/dL   Total Protein 6.6 6.1 - 8.1 g/dL   Albumin 3.7 3.6 - 5.1 g/dL   Globulin 2.9 1.9 - 3.7 g/dL (calc)   AG Ratio 1.3 1.0 - 2.5 (calc)   Total Bilirubin 0.3 0.2 - 1.2 mg/dL   Alkaline phosphatase (APISO) 43 40 - 115 U/L   AST 18 10 - 35 U/L   ALT 9 9 - 46 U/L  POCT Urinalysis Dipstick  Result Value Ref Range  Color, UA  yellow    Clarity, UA clear    Glucose, UA Positive (A) Negative   Bilirubin, UA negative    Ketones, UA negative    Spec Grav, UA 1.015 1.010 - 1.025   Blood, UA negative    pH, UA 5.0 5.0 - 8.0   Protein, UA Negative Negative   Urobilinogen, UA negative (A) 0.2 or 1.0 E.U./dL   Nitrite, UA negative    Leukocytes, UA Negative Negative   Appearance clear    Odor normal       Assessment & Plan:   Problem List Items Addressed This Visit      Cardiovascular and Mediastinum   Aortic stenosis, mild (Chronic)    Seeing Dr. Oval Linsey in January        Endocrine   Uncontrolled type 2 diabetes mellitus with diabetic nephropathy (HCC)    Expect a1c to have improved since last check with his FSBS improved; try to stay avoid sugary drinks; foot exam by MD today; he is overdue for eye exam and he has already made the appt, next week; asked patient to have eye doctor send [place] a copy of the visit      Relevant Orders   Hemoglobin K4M   BASIC METABOLIC PANEL WITH GFR     Genitourinary   Chronic kidney disease (CKD), stage III (moderate) (HCC) (Chronic)    Avoid NSAIDs; stay well-hydrated; see nephrologist regularly      Relevant Orders   BASIC METABOLIC PANEL WITH GFR     Other   Hyperlipidemia LDL goal <100 (Chronic)    Avoid saturated fats as much as possible; continue meds; check lipids      Relevant Orders   Lipid panel   Encounter for medication monitoring    Last liver enzymes were normal jus 3 months ago; will check Cr      Relevant Orders   BASIC METABOLIC PANEL WITH GFR       Follow up plan: No follow-ups on file.  An after-visit summary was printed and given to the patient at [place].  Please see the patient instructions which may contain other information and recommendations beyond what is mentioned above in the assessment and plan.  No orders of the defined types were placed in this encounter.   Orders Placed This Encounter  Procedures  . Lipid panel    . Hemoglobin A1c  . BASIC METABOLIC PANEL WITH GFR

## 2018-08-02 NOTE — Assessment & Plan Note (Signed)
Avoid saturated fats as much as possible; continue meds; check lipids

## 2018-08-02 NOTE — Assessment & Plan Note (Addendum)
Last liver enzymes were normal jus 3 months ago; will check Cr

## 2018-08-03 LAB — HEMOGLOBIN A1C
EAG (MMOL/L): 11.2 (calc)
Hgb A1c MFr Bld: 8.7 % of total Hgb — ABNORMAL HIGH (ref <5.7)
MEAN PLASMA GLUCOSE: 203 (calc)

## 2018-08-03 LAB — LIPID PANEL
CHOL/HDL RATIO: 3 (calc) (ref <5.0)
CHOLESTEROL: 183 mg/dL (ref <200)
HDL: 61 mg/dL (ref >40)
LDL CHOLESTEROL (CALC): 106 mg/dL — AB
Non-HDL Cholesterol (Calc): 122 mg/dL (calc) (ref <130)
Triglycerides: 75 mg/dL (ref <150)

## 2018-08-03 LAB — BASIC METABOLIC PANEL WITH GFR
BUN/Creatinine Ratio: 17 (calc) (ref 6–22)
BUN: 23 mg/dL (ref 7–25)
CALCIUM: 9.6 mg/dL (ref 8.6–10.3)
CHLORIDE: 107 mmol/L (ref 98–110)
CO2: 28 mmol/L (ref 20–32)
Creat: 1.33 mg/dL — ABNORMAL HIGH (ref 0.70–1.11)
GFR, EST AFRICAN AMERICAN: 57 mL/min/{1.73_m2} — AB (ref >=60)
GFR, Est Non African American: 49 mL/min/{1.73_m2} — ABNORMAL LOW (ref >=60)
Glucose, Bld: 104 mg/dL — ABNORMAL HIGH (ref 65–99)
POTASSIUM: 4.2 mmol/L (ref 3.5–5.3)
Sodium: 140 mmol/L (ref 135–146)

## 2018-08-06 ENCOUNTER — Other Ambulatory Visit: Payer: Self-pay | Admitting: Family Medicine

## 2018-08-06 MED ORDER — PIOGLITAZONE HCL 30 MG PO TABS: 30.0000 mg | ORAL_TABLET | Freq: Every day | ORAL | 5 refills | Status: DC

## 2018-08-06 MED ORDER — DULAGLUTIDE 1.5 MG/0.5ML ~~LOC~~ SOAJ: SUBCUTANEOUS | 5 refills | Status: DC

## 2018-08-06 NOTE — Progress Notes (Signed)
Joelene Millin, please let the patient know that his LDL cholesterol is much better; he brought the LDL down from 143 to 106; that a huge improvement His A1c is too high, so I'd like to increase his Trulicity from 1.57 to 1.5. I sent a new prescription to his pharmacy. Don't use both strengths. Just use the higher strength now. I also increased his Actos (pioglitazone) from 15 mg to 30 mg daily. He can use two of the 15 mg pills to equal 30 mg to use those up and then start the new prescription. If he develops leg edema or shortness of breath or other symptoms, stop right away and seek medical attention. Kidney function has improved a little bit. Thank you

## 2018-08-06 NOTE — Progress Notes (Signed)
Increase trulicity; increase actos

## 2018-08-09 ENCOUNTER — Other Ambulatory Visit: Payer: Self-pay | Admitting: Family Medicine

## 2018-08-09 MED ORDER — DULAGLUTIDE 0.75 MG/0.5ML ~~LOC~~ SOAJ: SUBCUTANEOUS | 5 refills | Status: DC

## 2018-08-11 ENCOUNTER — Telehealth: Payer: Self-pay

## 2018-08-11 LAB — HM DIABETES EYE EXAM

## 2018-08-11 NOTE — Telephone Encounter (Signed)
Spoke with patient he has an appointment on Monday and we will discuss further.  As of right now he will not take trulicity due to side effects he has read.  Will just take the increase in Actos and his insulin.

## 2018-08-11 NOTE — Telephone Encounter (Signed)
Please offer referral to endocrinologist If he refuses endo, offer for him to see Brian [name], PharmD to go over his medicines and diabetes His diabetes is out of control and we need to bring his A1c down If he refuses to take the Trulicity, then we'll at least have him increase the Actos, but we really really need to get his sugars down  Lab Results  Component Value Date   HGBA1C 8.7 (H) 08/02/2018

## 2018-08-11 NOTE — Telephone Encounter (Signed)
Patient stopped by today, He is very concerned with his new meds.  He does not want to start on Trulicity, he states to many side effects.  He states sugars run low in am this morning was 73 and scared that they may drop to low.  You also increased his actos to 30.  If you do feel he needs something else he wanted to see if you can up his insulin?  Please advise

## 2018-08-12 ENCOUNTER — Ambulatory Visit: Payer: Self-pay | Admitting: Pharmacist

## 2018-08-13 NOTE — Chronic Care Management (AMB) (Signed)
  Chronic Care Management   Note  08/13/2018 Name: Brian [name] MRN: [id number] DOB: [birth year]    Follow up call placed to patient to plan for medication management and assistance and 2020. [name] requested to a CMA that he would like a call  Was unable to reach patient via telephone today and have left HIPAA compliant voicemail asking patient to return my call. (unsuccessful outreach #[phone]).  Plan: Will follow-up within 3-5  business days via telephone.   Ruben Reason, PharmD Clinical Pharmacist [facility] [phone]

## 2018-08-16 ENCOUNTER — Ambulatory Visit (INDEPENDENT_AMBULATORY_CARE_PROVIDER_SITE_OTHER): Payer: Medicare Other | Admitting: Family Medicine

## 2018-08-16 ENCOUNTER — Encounter: Payer: Self-pay | Admitting: Family Medicine

## 2018-08-16 VITALS — BP 122/64 | HR 44 | Temp 97.9°F | Ht 71.0 in | Wt 172.4 lb

## 2018-08-16 DIAGNOSIS — IMO0002 Reserved for concepts with insufficient information to code with codable children: Secondary | ICD-10-CM

## 2018-08-16 DIAGNOSIS — E1121 Type 2 diabetes mellitus with diabetic nephropathy: Secondary | ICD-10-CM | POA: Diagnosis not present

## 2018-08-16 DIAGNOSIS — E1165 Type 2 diabetes mellitus with hyperglycemia: Secondary | ICD-10-CM | POA: Diagnosis not present

## 2018-08-16 DIAGNOSIS — R001 Bradycardia, unspecified: Secondary | ICD-10-CM

## 2018-08-16 DIAGNOSIS — I10 Essential (primary) hypertension: Secondary | ICD-10-CM

## 2018-08-16 NOTE — Assessment & Plan Note (Signed)
Well controlled 

## 2018-08-16 NOTE — Progress Notes (Signed)
BP 122/64   Pulse (!) 44   Temp 97.9 F (36.6 C) (Oral)   Ht 5\' 11"  (1.803 m)   Wt 172 lb 6.4 oz (78.2 kg)   SpO2 99%   BMI 24.04 kg/m   Pulse ox check by MD = heart rate 44 Pulse 22 beats in 30 seconds, timed by MD = 44 bpm   Subjective:    Patient ID: Brian [name], male    DOB: 04-03-1934, 84 y.o.   MRN: [id number]  HPI: Brian [name] is a 84 y.o. male  Chief Complaint  Patient presents with  . Diabetes    discuss meds    HPI Here for diabetes; he wants to talk about diabetes medicines We reviewed his medicines He did not want to take the shot (GLP-1 agonist) He is still using Levemir, varying between 15-20 units daily This morning, sugar was 82; yesterday 95; FSBS all under 140 He also increased the actos to 30 mg daily; no issues with leg swelling; no SHOB Lab Results  Component Value Date   HGBA1C 8.7 (H) 08/02/2018   He is avoiding cooked carrots He was eating candy at one time, but stopped eating them (Snickers) Cereal with 1% milk and a banana, maybe 1 or 2 peanut butter crackers for breakfast; no grits lately Typical lunch: double stack burger with lettuce and tomato; baked potato or meat (no pork) and string beans Dinner: typical meat, bread, veggies He drinks diet [facility], caffeine-free  He denies chest pain, SHOB, feeling tired  Depression screen [facility] 2/9 08/02/2018 05/03/2018 04/30/2018 02/22/2018 12/08/2017  Decreased Interest 0 0 0 0 0  Down, Depressed, Hopeless 0 0 0 0 0  PHQ - 2 Score 0 0 0 0 0  Altered sleeping 0 - 0 - -  Tired, decreased energy 0 - 0 - -  Change in appetite 0 - 0 - -  Feeling bad or failure about yourself  0 - 0 - -  Trouble concentrating 0 - 0 - -  Moving slowly or fidgety/restless 0 - 0 - -  Suicidal thoughts 0 - 0 - -  PHQ-9 Score 0 - 0 - -  Difficult doing work/chores - - Not difficult at all - -   Fall Risk  08/02/2018 05/03/2018 04/30/2018 02/22/2018 12/08/2017  Falls in the past year? 0 No No No No  Risk for fall  due to : - - Impaired vision - -  Risk for fall due to: Comment - - wears eyeglasses - -   Relevant past medical, surgical, family and social history reviewed Past Medical History:  Diagnosis Date  . Anemia of chronic renal failure 05/08/2015  . Aortic stenosis, mild 09/21/2015  . Arthritis   . Bladder stone   . Chronic kidney disease (CKD), stage III (moderate) ([place]) 05/08/2015   Followed by Dr. Rolly Salter   . Diastolic dysfunction without heart failure 06/08/2017  . History of bladder stone   . Hyperlipidemia   . Hypertension   . Nocturia   . Renal disorder   . Type 2 diabetes mellitus ([place])    Past Surgical History:  Procedure Laterality Date  . CYSTOSCOPY/RETROGRADE/URETEROSCOPY/STONE EXTRACTION WITH BASKET N/A 03/22/2013   Procedure: CYSTOSCOPY BLADDER STONE STONE EXTRACTION;  Surgeon: [name], MD;  Location: [place];  Service: Urology;  Laterality: N/A;  CYSTOSCOPY    . TONSILLECTOMY     Family History  Problem Relation Age of Onset  . Congestive Heart Failure Unknown   .  Diabetes Brother    Social History   Tobacco Use  . Smoking status: Former Smoker    Packs/day: 0.75    Years: 39.00    Pack years: 29.25    Types: Cigarettes    Last attempt to quit: 03/21/1993    Years since quitting: 25.4  . Smokeless tobacco: Never Used  . Tobacco comment: smoking cessation materials not required  Substance Use Topics  . Alcohol use: No    Alcohol/week: 0.0 standard drinks  . Drug use: No     [facility] Visit from 05/03/2018 in [facility]  AUDIT-C Score  0      Interim medical history since last visit reviewed. Allergies and medications reviewed  Review of Systems Per HPI unless specifically indicated above     Objective:    BP 122/64   Pulse (!) 44   Temp 97.9 F (36.6 C) (Oral)   Ht 5\' 11"  (1.803 m)   Wt 172 lb 6.4 oz (78.2 kg)   SpO2 99%   BMI 24.04 kg/m   Wt Readings from Last 3 Encounters:  08/16/18 172 lb 6.4  oz (78.2 kg)  08/02/18 168 lb 14.4 oz (76.6 kg)  05/03/18 176 lb 11.2 oz (80.2 kg)    Physical Exam Constitutional:      General: He is not in acute distress.    Appearance: Normal appearance. He is well-developed. He is not diaphoretic.  Eyes:     General: No scleral icterus.    Extraocular Movements: Extraocular movements intact.  Cardiovascular:     Rate and Rhythm: Regular rhythm. Bradycardia present.  No extrasystoles are present. Pulmonary:     Effort: Pulmonary effort is normal.     Breath sounds: Normal breath sounds.  Chest:     Comments: Repeat exam after speaking with cardiologist: pulse counted by MD = 80; cardiac beats 80, matching one for one with radial pulse Musculoskeletal:     Right lower leg: No edema.     Left lower leg: No edema.  Skin:    Coloration: Skin is not pale.  Neurological:     Mental Status: He is alert.  Psychiatric:        Mood and Affect: Mood is not anxious or depressed.     Results for orders placed or performed in visit on 08/12/18  HM DIABETES EYE EXAM  Result Value Ref Range   HM Diabetic Eye Exam No Retinopathy No Retinopathy      Assessment & Plan:   Problem List Items Addressed This Visit      Cardiovascular and Mediastinum   Hypertension goal BP (blood pressure) < 140/90 (Chronic)    Well-controlled        Endocrine   Uncontrolled type 2 diabetes mellitus with diabetic nephropathy ([place]) - Primary    Will have patient continue his medicines; check FSBS before meals and then at various meals, check a 90-120 minute post-prandial reading; note what was eaten and then notify me in a few weeks to consider meal-time insulin or other change to diabetes regimen; foot exam by MD today       Other Visit Diagnoses    Bradycardia       caught patient in bigeminal PVCs during exam and EKG; f/u auscultation and pulse check matched 1:1 at 80 bpm; spoke w/cards; check Mg2+, K+, thyroid   Relevant Orders   TSH   T4, free   BASIC  METABOLIC PANEL WITH GFR   Magnesium  Follow up plan: No follow-ups on file.  An after-visit summary was printed and given to the patient at [place].  Please see the patient instructions which may contain other information and recommendations beyond what is mentioned above in the assessment and plan.  No orders of the defined types were placed in this encounter.  Orders Placed This Encounter  Procedures  . TSH  . T4, free  . BASIC METABOLIC PANEL WITH GFR  . Magnesium

## 2018-08-16 NOTE — Assessment & Plan Note (Signed)
Will have patient continue his medicines; check FSBS before meals and then at various meals, check a 90-120 minute post-prandial reading; note what was eaten and then notify me in a few weeks to consider meal-time insulin or other change to diabetes regimen; foot exam by MD today

## 2018-08-16 NOTE — Patient Instructions (Addendum)
Continue to check your sugars as you are doing PLUS, I'd like you to check 90-120 minutes after you start a meal, for example check your sugar as you get ready to start breakfast and then check it again about an hour and a half to two hours after you started eating Do that just one meal a day, various meals through the week You could check before and after breakfast on Mondays and Thursdays, lunch on Tuesdays and Fridays, and supper on Wednesdays and Saturdays for example Let me know those readings in a few weeks and we'll see if maybe some mealtime insulin would be helpful if your sugars are really shooting up when you eat Continue your Levemir and your Actos (pioglitazone) Really try to limit simple carbs like white bread, white rice, pasta, bagels, sugary cereals, etc. We'll have you see the cardiologist If you feel dizzy, light-headed, sweaty, or develop chest pain, call 911

## 2018-08-17 ENCOUNTER — Telehealth: Payer: Self-pay

## 2018-08-17 DIAGNOSIS — I493 Ventricular premature depolarization: Secondary | ICD-10-CM

## 2018-08-17 LAB — BASIC METABOLIC PANEL WITH GFR
BUN / CREAT RATIO: 17 (calc) (ref 6–22)
BUN: 21 mg/dL (ref 7–25)
CALCIUM: 9.7 mg/dL (ref 8.6–10.3)
CO2: 28 mmol/L (ref 20–32)
Chloride: 105 mmol/L (ref 98–110)
Creat: 1.27 mg/dL — ABNORMAL HIGH (ref 0.70–1.11)
GFR, Est African American: 61 mL/min/{1.73_m2} (ref >=60)
GFR, Est Non African American: 52 mL/min/{1.73_m2} — ABNORMAL LOW (ref >=60)
Glucose, Bld: 209 mg/dL — ABNORMAL HIGH (ref 65–99)
Potassium: 4.5 mmol/L (ref 3.5–5.3)
Sodium: 138 mmol/L (ref 135–146)

## 2018-08-17 LAB — T4, FREE: Free T4: 0.9 ng/dL (ref 0.8–1.8)

## 2018-08-17 LAB — MAGNESIUM: Magnesium: 1.8 mg/dL (ref 1.5–2.5)

## 2018-08-17 LAB — TSH: TSH: 1.36 mIU/L (ref 0.40–4.50)

## 2018-08-17 NOTE — Progress Notes (Signed)
Brian [name], please let the patient know that his labs don't show a reason for his heart rhythm

## 2018-08-17 NOTE — Telephone Encounter (Signed)
DOD call received form pt pcp Dr.[name] discussed with Dr.Acharya.  EKG performed at pcp office reviewed by Dr. Margaretann Loveless. Pt in [place] with PVC's, pt was also asymptomatic. Pt has a hx of bradycardia while on a beta blocker. Per Dr.Acharya, have EKG reviewed by the pt Cardiologist Dr.Nason when she returns to the office on 08/17/18.  EKG reviewed by Dr.Piatt. pt will need a EP consult. Pt should keep his f/u with her in Jan 2020.  Call and spoke with the pt wife. DPR on file. Adv her of Dr.Randolphs recommendation. Adv her that a scheduler will call them to schedule an EP consult as recommended by Dr.[place]. pt wife questions answered at this time. Pt wife verbalized understanding to the information given.

## 2018-08-19 ENCOUNTER — Telehealth: Payer: Self-pay

## 2018-08-19 MED ORDER — INSULIN DETEMIR 100 UNIT/ML FLEXPEN: 18.0000 [IU] | PEN_INJECTOR | Freq: Every day | SUBCUTANEOUS | 2 refills | Status: DC

## 2018-08-19 NOTE — Telephone Encounter (Signed)
Copied from [place] [id number]. Topic: Quick Communication - Other Results (Clinic Use ONLY) >> Aug 19, 2018 12:09 PM Brian [name] wrote: Pt is asking that you give him a call on him cell [phone] to discuss his medications

## 2018-08-19 NOTE — Telephone Encounter (Signed)
I returned his call We talked about his diabetes medicine The pill is doing "good" He was taking about 15 units of insulin and upped it a few units and it's doing good He will take 18 units a day This morning's FSBS was 108 Yesterday it was 110 Then 119 the day before He would eat a Snickers bar every now and then, and then he says "you might go overboard sometimes" "It is running pretty good right now" he says I am so pleased with how he's doing and appreciate him giving me feedback so we can address his diabetes together He is going to see the EP doctor in January and then his regular heart doctor later in January We reviewed his upcoming appointments with both heart doctors and he wrote them down, as I gave him dates and times, arrive early for visits

## 2018-08-24 ENCOUNTER — Encounter: Payer: Self-pay | Admitting: Family Medicine

## 2018-08-27 ENCOUNTER — Encounter: Payer: Self-pay | Admitting: *Deleted

## 2018-08-27 ENCOUNTER — Other Ambulatory Visit: Payer: Self-pay | Admitting: Family Medicine

## 2018-08-27 DIAGNOSIS — I10 Essential (primary) hypertension: Secondary | ICD-10-CM

## 2018-09-01 ENCOUNTER — Telehealth: Payer: Self-pay | Admitting: Family Medicine

## 2018-09-01 DIAGNOSIS — I10 Essential (primary) hypertension: Secondary | ICD-10-CM

## 2018-09-01 MED ORDER — AMLODIPINE BESYLATE 10 MG PO TABS: 10.0000 mg | ORAL_TABLET | Freq: Every day | ORAL | 3 refills | Status: DC

## 2018-09-01 NOTE — Telephone Encounter (Addendum)
Pt called to follow up on request for refill. I spoke with the pharmacist, because pt should have 2 months left on the rx amLODipine (NORVASC) 10 MG tablet But.. Pt insurance will not pay for a 30 day Rx, only a 90 day. The pharmacy states the 2 months left on his rx, Jan and Feb (due to refill 10/27/2018), but pt cannot pick up the remaining amount without it being a 90 day. Pt has his regular appt scheduled for 3/09, but needs new Rx for a 90 day sent to:  [id number] [address], [place] [phone] (Phone) [phone] (Fax)

## 2018-09-01 NOTE — Telephone Encounter (Signed)
Pt.notified

## 2018-09-01 NOTE — Addendum Note (Signed)
Addended by: Reakwon Barren, [name] on: 09/01/2018 02:36 PM   Modules accepted: Orders

## 2018-09-01 NOTE — Telephone Encounter (Signed)
Rx sent. Please let him know.

## 2018-09-06 ENCOUNTER — Ambulatory Visit (INDEPENDENT_AMBULATORY_CARE_PROVIDER_SITE_OTHER): Payer: Medicare Other | Admitting: Cardiology

## 2018-09-06 ENCOUNTER — Encounter: Payer: Self-pay | Admitting: Cardiology

## 2018-09-06 VITALS — BP 132/62 | HR 86 | Ht 71.0 in | Wt 173.0 lb

## 2018-09-06 DIAGNOSIS — I10 Essential (primary) hypertension: Secondary | ICD-10-CM

## 2018-09-06 DIAGNOSIS — E785 Hyperlipidemia, unspecified: Secondary | ICD-10-CM | POA: Diagnosis not present

## 2018-09-06 DIAGNOSIS — I350 Nonrheumatic aortic (valve) stenosis: Secondary | ICD-10-CM

## 2018-09-06 DIAGNOSIS — I493 Ventricular premature depolarization: Secondary | ICD-10-CM

## 2018-09-06 NOTE — Patient Instructions (Signed)
Medication Instructions:  Your physician recommends that you continue on your current medications as directed. Please refer to the Current Medication list given to you today.  * If you need a refill on your cardiac medications before your next appointment, please call your pharmacy.   Labwork: None ordered  Testing/Procedures: Your physician has recommended that you wear a 48 hour holter monitor. Holter monitors are medical devices that record the heart's electrical activity. Doctors most often use these monitors to diagnose arrhythmias. Arrhythmias are problems with the speed or rhythm of the heartbeat. The monitor is a small, portable device. You can wear one while you do your normal daily activities. This is usually used to diagnose what is causing palpitations/syncope (passing out).   Follow-Up: Follow up to be determined once monitor is complete and reviewed by the physician.   Thank you for choosing [facility]!!   Trinidad Curet, RN [phone]  Any Other Special Instructions Will Be Listed Below (If Applicable).   Ambulatory Cardiac Monitoring An ambulatory cardiac monitor is a small recording device that is used to detect abnormal heart rhythms (arrhythmias). Most monitors are connected by wires to flat, sticky disks (electrodes) that are then attached to your chest. You may need to wear a monitor if you have had symptoms such as:  Fast heartbeats (palpitations).  Dizziness.  Fainting or light-headedness.  Unexplained weakness.  Shortness of breath. There are several types of monitors. Some common monitors include:  Holter monitor. This records your heart rhythm continuously, usually for 24-48 hours.  Event (episodic) monitor. This monitor has a symptoms button, and when pushed, it will begin recording. You need to activate this monitor to record when you have a heart-related symptom.  Automatic detection monitor. This monitor will begin recording when it  detects an abnormal heartbeat. What are the risks? Generally, these devices are safe to use. However, it is possible that the skin under the electrodes will become irritated. How to prepare for monitoring Your health care provider will prepare your chest for the electrode placement and show you how to use the monitor.  Do not apply lotions to your chest before monitoring.  Follow directions on how to care for the monitor, and how to return the monitor when the testing period is complete. How to use your cardiac monitor  Follow directions about how long to wear the monitor, and if you can take the monitor off in order to shower or bathe. ? Do not let the monitor get wet. ? Do not bathe, swim, or use a hot tub while wearing the monitor.  Keep your skin clean. Do not put body lotion or moisturizer on your chest.  Change the electrodes as told by your health care provider, or any time they stop sticking to your skin. You may need to use medical tape to keep them on.  Try to put the electrodes in slightly different places on your chest to help prevent skin irritation. Follow directions from your health care provider about where to place the electrodes.  Make sure the monitor is safely clipped to your clothing or in a location close to your body as recommended by your health care provider.  If your monitor has a symptoms button, press the button to mark an event as soon as you feel a heart-related symptom, such as: ? Dizziness. ? Weakness. ? Light-headedness. ? Palpitations. ? Thumping or pounding in your chest. ? Shortness of breath. ? Unexplained weakness.  Keep a diary of your activities,  such as walking, doing chores, and taking medicine. It is very important to note what you were doing when you pushed the button to record your symptoms. This will help your health care provider determine what might be contributing to your symptoms.  Send the recorded information as recommended by your  health care provider. It may take some time for your health care provider to process the results.  Change the batteries as told by your health care provider.  Keep electronic devices away from your monitor. These include: ? Tablets. ? MP3 players. ? Cell phones.  While wearing your monitor you should avoid: ? Electric blankets. ? Armed forces operational officer. ? Electric toothbrushes. ? Microwave ovens. ? Magnets. ? Metal detectors. Get help right away if:  You have chest pain.  You have shortness of breath or extreme difficulty breathing.  You develop a very fast heartbeat that does not get better.  You develop dizziness that does not go away.  You faint or constantly feel like you are about to faint. Summary  An ambulatory cardiac monitor is a small recording device that is used to detect abnormal heart rhythms (arrhythmias).  Make sure you understand how to send the information from the monitor to your health care provider.  It is important to press the button on the monitor when you have any heart-related symptoms.  Keep a diary of your activities, such as walking, doing chores, and taking medicine. It is very important to note what you were doing when you pushed the button to record your symptoms. This will help your health care provider learn what might be causing your symptoms. This information is not intended to replace advice given to you by your health care provider. Make sure you discuss any questions you have with your health care provider. Document Released: 05/20/2008 Document Revised: 05/27/2017 Document Reviewed: 07/26/2016 Elsevier Interactive Patient Education  2019 [employer].

## 2018-09-06 NOTE — Progress Notes (Signed)
Electrophysiology Office Note   Date:  09/06/2018   ID:  Brian [name], DOB 10-Jan-1934, MRN [id number]  PCP:  Arnetha Courser, MD  Cardiologist: Oval Linsey Primary Electrophysiologist:  Shady Bradish Meredith Leeds, MD    No chief complaint on file.    History of Present Illness: Brian [name] is a 85 y.o. male who is being seen today for the evaluation of PVCs at the request of Skeet Latch, MD. Presenting today for electrophysiology evaluation.  He has a history of aortic stenosis, hypertension, diabetes, CKD stage III, and hyperlipidemia.    Today, he denies symptoms of palpitations, chest pain, shortness of breath, orthopnea, PND, lower extremity edema, claudication, dizziness, presyncope, syncope, bleeding, or neurologic sequela. The patient is tolerating medications without difficulties.  Occasional palpitations, but otherwise feels well.  He is able to do all of his daily activities without restriction.  He was noted to have PVCs on an EKG in a bigeminal pattern.  It was done at his primary physician's office.   Past Medical History:  Diagnosis Date  . Anemia of chronic renal failure 05/08/2015  . Aortic stenosis, mild 09/21/2015  . Arthritis   . Bladder stone   . Chronic kidney disease (CKD), stage III (moderate) ([place]) 05/08/2015   Followed by Dr. Rolly Salter   . Diastolic dysfunction without heart failure 06/08/2017  . History of bladder stone   . Hyperlipidemia   . Hypertension   . Nocturia   . Renal disorder   . Type 2 diabetes mellitus ([place])    Past Surgical History:  Procedure Laterality Date  . CYSTOSCOPY/RETROGRADE/URETEROSCOPY/STONE EXTRACTION WITH BASKET N/A 03/22/2013   Procedure: CYSTOSCOPY BLADDER STONE STONE EXTRACTION;  Surgeon: [name], MD;  Location: [place];  Service: Urology;  Laterality: N/A;  CYSTOSCOPY    . TONSILLECTOMY       Current Outpatient Medications  Medication Sig Dispense Refill  . amLODipine (NORVASC) 10  MG tablet Take 1 tablet (10 mg total) by mouth daily. (for high blood pressure) 90 tablet 3  . aspirin EC 81 MG tablet Take 81 mg by mouth daily. Reported on 09/17/2015    . Blood Glucose Monitoring Suppl (ONE TOUCH ULTRA 2) W/DEVICE KIT     . docusate sodium (DOK) 100 MG capsule Take 1 capsule (100 mg total) by mouth 2 (two) times daily as needed. 100 capsule 5  . finasteride (PROSCAR) 5 MG tablet TAKE ONE (1) TABLET EACH DAY 30 tablet 11  . glucose blood test strip CHECK BG THREE TIMES DAILY AS DIRECTED  E11.29 Lon:99 100 each 11  . Insulin Detemir (LEVEMIR FLEXTOUCH) 100 UNIT/ML Pen Inject 18 Units into the skin daily. (Patient taking differently: Inject 15 Units into the skin daily. ) 15 mL 2  . Insulin Pen Needle (B-D UF III MINI PEN NEEDLES) 31G X 5 MM MISC USE AS DIRECTED WITH LEVEMIR INSULIN PENS AT BEDTIME, once a day 100 each 3  . nystatin-triamcinolone (MYCOLOG II) cream APPLY TO AFFECTED AREAS 3 TIMES DAILY AS NEEDED 60 g 2  . Omega-3 Fatty Acids (FISH OIL) 1000 MG CAPS Take 1 capsule (1,000 mg total) by mouth daily. 90 capsule 3  . pioglitazone (ACTOS) 30 MG tablet Take 1 tablet (30 mg total) by mouth daily. 30 tablet 5  . quinapril (ACCUPRIL) 20 MG tablet TAKE 1 TABLET BY MOUTH TWICE A DAY 60 tablet 5  . rosuvastatin (CRESTOR) 5 MG tablet One by mouth just three nights a week (Patient taking differently: One  by mouth just three nights a week, Monday, Wednesday, Friday) 39 tablet 1  . sildenafil (VIAGRA) 100 MG tablet Take 0.5-1 tablets (50-100 mg total) by mouth daily as needed for erectile dysfunction. 10 tablet 5  . tamsulosin (FLOMAX) 0.4 MG CAPS capsule TAKE 1 CAPSULE BY MOUTH EVERY DAY 90 capsule 1   No current facility-administered medications for this visit.     Allergies:   Carvedilol; Niaspan [niacin er]; Shellfish allergy; Statins; Sulfa antibiotics; Zetia [ezetimibe]; and Januvia [sitagliptin]   Social History:  The patient  reports that he quit smoking about 25 years  ago. His smoking use included cigarettes. He has a 29.25 pack-year smoking history. He has never used smokeless tobacco. He reports that he does not drink alcohol or use drugs.   Family History:  The patient's family history includes Congestive Heart Failure in an other family member; Diabetes in his brother; Heart attack in an other family member; Heart disease in an other family member.    ROS:  Please see the history of present illness.   Otherwise, review of systems is positive for palpitations.   All other systems are reviewed and negative.    PHYSICAL EXAM: VS:  BP 132/62   Pulse 86   Ht _0  (1.803 m)   Wt 173 lb (78.5 kg)   BMI 24.13 kg/m  , BMI Body mass index is 24.13 kg/m. GEN: Well nourished, well developed, in no acute distress  HEENT: normal  Neck: no JVD, carotid bruits, or masses Cardiac: RRR; 2 out of 6 systolic murmur at the base, rubs, or gallops,no edema  Respiratory:  clear to auscultation bilaterally, normal work of breathing GI: soft, nontender, nondistended, + BS MS: no deformity or atrophy  Skin: warm and dry Neuro:  Strength and sensation are intact Psych: euthymic mood, full affect  EKG:  EKG is ordered today. Personal review of the ekg ordered shows sinus rhythm, right axis deviation, rate 86  Recent Labs: 05/03/2018: ALT 9 08/16/2018: BUN 21; Creat 1.27; Magnesium 1.8; Potassium 4.5; Sodium 138; TSH 1.36    Lipid Panel     Component Value Date/Time   CHOL 183 08/02/2018 0817   TRIG 75 08/02/2018 0817   HDL 61 08/02/2018 0817   CHOLHDL 3.0 08/02/2018 0817   VLDL 17 02/27/2017 0844   LDLCALC 106 (H) 08/02/2018 0817     Wt Readings from Last 3 Encounters:  09/06/18 173 lb (78.5 kg)  08/16/18 172 lb 6.4 oz (78.2 kg)  08/02/18 168 lb 14.4 oz (76.6 kg)      Other studies Reviewed: Additional studies/ records that were reviewed today include: TTE 12/14/17  Review of the above records today demonstrates:  - Left ventricle: The cavity size  was normal. There was severe   concentric hypertrophy. Systolic function was vigorous. The   estimated ejection fraction was in the range of 65% to 70%. Wall   motion was normal; there were no regional wall motion   abnormalities. Doppler parameters are consistent with abnormal   left ventricular relaxation (grade 1 diastolic dysfunction). The   E/e&' ratio is between 8-15, suggesting indeterminate LV filling   pressure. - Aortic valve: Heavily calcified. Trileaflet valve. Moderate   stenosis. Mean gradient (S): 20 mm Hg. Peak gradient (S): 36 mm   Hg. Valve area (VTI): 1.24 cm^2. Valve area (Vmax): 1.3 cm^2.   Valve area (Vmean): 1.38 cm^2. - Aorta: Aortic root dimension: 41 mm (ED). Ascending aortic   diameter: 38 mm (S). -  Ascending aorta: The ascending aorta was mildly dilated. - Mitral valve: Mildly thickened leaflets . There was trivial   regurgitation. - Left atrium: The atrium was normal in size. - Tricuspid valve: Peak RV-RA gradient (S): 14 mm Hg. - Inferior vena cava: The vessel was normal in size. The   respirophasic diameter changes were in the normal range (>= 50%),   consistent with normal central venous pressure.   ASSESSMENT AND PLAN:  1.  PVCs: Currently he is not having any PVCs.  In review of his PVC morphology it certainly appears to be left ventricular in origin.  He was having bigeminy previously but EKG today is normal.  He is minimally symptomatic from this.  We Bryse Blanchette thus fit him with a 48hour monitor for further determination.  2.  Hypertension: Well-controlled.  No changes.  3.  Hyperlipidemia: LDL is at goal.  Has been unable to tolerate higher doses of statins.  Per primary cardiology.  4.  Moderate aortic stenosis: Has repeat echo scheduled.  No changes at this time.    Current medicines are reviewed at length with the patient today.   The patient does not have concerns regarding his medicines.  The following changes were made today:  none  Labs/  tests ordered today include:  Orders Placed This Encounter  Procedures  . Holter monitor - 48 hour  . EKG 12-Lead   Case discussed with primary cardiologist  Disposition:   FU with Tuere Nwosu pending link monitor  Signed, Birdie Fetty Meredith Leeds, MD  09/06/2018 9:15 AM     [facility] [address] [place]  [place] [address] [phone] (office) [phone] (fax)

## 2018-09-07 ENCOUNTER — Telehealth: Payer: Self-pay | Admitting: Cardiovascular Disease

## 2018-09-07 NOTE — Telephone Encounter (Signed)
° ° °  Patient would like to know if he needs to keep appt with Dr Oval Linsey. Patient saw Dr Curt Bears on 1/13. Please advise

## 2018-09-07 NOTE — Telephone Encounter (Signed)
Called patient advised to keep appointment with Dr.Grandfield as per last note.

## 2018-09-10 ENCOUNTER — Ambulatory Visit (INDEPENDENT_AMBULATORY_CARE_PROVIDER_SITE_OTHER): Payer: Medicare Other | Admitting: Cardiovascular Disease

## 2018-09-10 ENCOUNTER — Encounter: Payer: Self-pay | Admitting: Cardiovascular Disease

## 2018-09-10 VITALS — BP 108/54 | HR 94 | Ht 70.0 in | Wt 173.4 lb

## 2018-09-10 DIAGNOSIS — I350 Nonrheumatic aortic (valve) stenosis: Secondary | ICD-10-CM

## 2018-09-10 DIAGNOSIS — I493 Ventricular premature depolarization: Secondary | ICD-10-CM | POA: Diagnosis not present

## 2018-09-10 DIAGNOSIS — E785 Hyperlipidemia, unspecified: Secondary | ICD-10-CM | POA: Diagnosis not present

## 2018-09-10 DIAGNOSIS — I10 Essential (primary) hypertension: Secondary | ICD-10-CM | POA: Diagnosis not present

## 2018-09-10 NOTE — Patient Instructions (Signed)
Medication Instructions:  Your physician recommends that you continue on your current medications as directed. Please refer to the Current Medication list given to you today.  If you need a refill on your cardiac medications before your next appointment, please call your pharmacy.   Lab work: NONE   Testing/Procedures: GET MONITOR AS SCHEDULED   Follow-Up: At [facility], you and your health needs are our priority.  As part of our continuing mission to provide you with exceptional heart care, we have created designated Provider Care Teams.  These Care Teams include your primary Cardiologist (physician) and Advanced Practice Providers (APPs -  Physician Assistants and Nurse Practitioners) who all work together to provide you with the care you need, when you need it. You will need a follow up appointment in 12 months.  Please call our office 2 months in advance to schedule this appointment.  You may see DR [facility] or one of the following Advanced Practice Providers on your designated Care Team:   Kerin Ransom, PA-C Roby Lofts, [place] . Sande Rives, PA-C

## 2018-09-10 NOTE — Progress Notes (Signed)
Cardiology Office Note   Date:  09/10/2018   ID:  [name], DOB 07/11/1934, MRN [id number]  PCP:  Arnetha Courser, MD  Cardiologist:   Skeet Latch, MD   No chief complaint on file.   Patient ID: Brian [name] is a 85 y.o. male with mild aortic stenosis, hypertension, diabetes mellitus, CKD 3, and hyperlipidemia who presents for follow up.  History of Present Illness: Brian [name] was admitted to the hospital 06/2015 with liver abscesses and sepsis. During that hospitalization he had demand ischemia with a peak troponin of 1.08.  EKG was negative for ischemia and his echo revealed normal systolic function, grade 1 diastolic dysfunction and mild aortic stenosis.  He followed up with cardiology following that hospitalization and was doing well.  He underwent exercise Cardiolite on 09/2015 that was negative for ischemia. He has been intolerant to statins in the past and was tried on pravastatin. At a follow-up appointment with his primary care physician who is noted to be bradycardic to 45 bpm. Carvedilol was held and he remained asymptomatic.  Since his last appointment he was noted to be in ventricular bigeminy at a PCP appointment.  He was referred to EP given his history of bradycardia on beta blockers.  A 48 hour Holter was ordered but he hasn't yet gotten the monitor.  Brian [name] has been feeling well.  He has rare episodes of chest discomfort that last for a few seconds.  It occurs randomly and not with exertion.  He is very active and likes outdoor activities but doesn't get much formal exercise.  He has no shortness o breath, lower extremity edema, orthopnea or PND.  He hasn't noted any palpitations, lightheadedness or dizziness.     Past Medical History:  Diagnosis Date  . Anemia of chronic renal failure 05/08/2015  . Aortic stenosis, mild 09/21/2015  . Arthritis   . Bladder stone   . Chronic kidney disease (CKD), stage III (moderate) ([place]) 05/08/2015   Followed by  Dr. Rolly Salter   . Diastolic dysfunction without heart failure 06/08/2017  . History of bladder stone   . Hyperlipidemia   . Hypertension   . Nocturia   . Renal disorder   . Type 2 diabetes mellitus ([place])     Past Surgical History:  Procedure Laterality Date  . CYSTOSCOPY/RETROGRADE/URETEROSCOPY/STONE EXTRACTION WITH BASKET N/A 03/22/2013   Procedure: CYSTOSCOPY BLADDER STONE STONE EXTRACTION;  Surgeon: [name], MD;  Location: [place];  Service: Urology;  Laterality: N/A;  CYSTOSCOPY    . TONSILLECTOMY       Current Outpatient Medications  Medication Sig Dispense Refill  . amLODipine (NORVASC) 10 MG tablet Take 1 tablet (10 mg total) by mouth daily. (for high blood pressure) 90 tablet 3  . aspirin EC 81 MG tablet Take 81 mg by mouth daily. Reported on 09/17/2015    . Blood Glucose Monitoring Suppl (ONE TOUCH ULTRA 2) W/DEVICE KIT     . docusate sodium (DOK) 100 MG capsule Take 1 capsule (100 mg total) by mouth 2 (two) times daily as needed. 100 capsule 5  . finasteride (PROSCAR) 5 MG tablet TAKE ONE (1) TABLET EACH DAY 30 tablet 11  . glucose blood test strip CHECK BG THREE TIMES DAILY AS DIRECTED  E11.29 Lon:99 100 each 11  . Insulin Detemir (LEVEMIR FLEXTOUCH) 100 UNIT/ML Pen Inject 18 Units into the skin daily. (Patient taking differently: Inject 15 Units into the skin daily. ) 15 mL 2  .  Insulin Pen Needle (B-D UF III MINI PEN NEEDLES) 31G X 5 MM MISC USE AS DIRECTED WITH LEVEMIR INSULIN PENS AT BEDTIME, once a day 100 each 3  . nystatin-triamcinolone (MYCOLOG II) cream APPLY TO AFFECTED AREAS 3 TIMES DAILY AS NEEDED 60 g 2  . Omega-3 Fatty Acids (FISH OIL) 1000 MG CAPS Take 1 capsule (1,000 mg total) by mouth daily. 90 capsule 3  . pioglitazone (ACTOS) 30 MG tablet Take 1 tablet (30 mg total) by mouth daily. 30 tablet 5  . quinapril (ACCUPRIL) 20 MG tablet TAKE 1 TABLET BY MOUTH TWICE A DAY 60 tablet 5  . rosuvastatin (CRESTOR) 5 MG tablet One by mouth  just three nights a week (Patient taking differently: One by mouth just three nights a week, Monday, Wednesday, Friday) 39 tablet 1  . sildenafil (VIAGRA) 100 MG tablet Take 0.5-1 tablets (50-100 mg total) by mouth daily as needed for erectile dysfunction. 10 tablet 5  . tamsulosin (FLOMAX) 0.4 MG CAPS capsule TAKE 1 CAPSULE BY MOUTH EVERY DAY 90 capsule 1   No current facility-administered medications for this visit.     Allergies:   Carvedilol; Niaspan [niacin er]; Shellfish allergy; Statins; Sulfa antibiotics; Zetia [ezetimibe]; and Januvia [sitagliptin]    Social History:  The patient  reports that he quit smoking about 25 years ago. His smoking use included cigarettes. He has a 29.25 pack-year smoking history. He has never used smokeless tobacco. He reports that he does not drink alcohol or use drugs.   Family History:  The patient's family history includes Congestive Heart Failure in an other family member; Diabetes in his brother; Heart attack in an other family member; Heart disease in an other family member.    ROS:  Please see the history of present illness.   Otherwise, review of systems are positive for weight loss.   All other systems are reviewed and negative.    PHYSICAL EXAM: VS:  BP (!) 108/54   Pulse 94   Ht 5' 10" (1.778 m)   Wt 173 lb 6.4 oz (78.7 kg)   BMI 24.88 kg/m  , BMI Body mass index is 24.88 kg/m. GENERAL:  Well appearing HEENT: Pupils equal round and reactive, fundi not visualized, oral mucosa unremarkable NECK:  No jugular venous distention, waveform within normal limits, carotid upstroke brisk and symmetric, no bruits, no thyromegaly LYMPHATICS:  No cervical adenopathy LUNGS:  Clear to auscultation bilaterally HEART:  RRR.  PMI not displaced or sustained,S1 and S2 within normal limits, no S3, no S4, no clicks, no rubs, II/VI mid-peaking systolic murmur ABD:  Flat, positive bowel sounds normal in frequency in pitch, no bruits, no rebound, no guarding, no  midline pulsatile mass, no hepatomegaly, no splenomegaly EXT:  2 plus pulses throughout, no edema, no cyanosis no clubbing SKIN:  No rashes no nodules NEURO:  Cranial nerves II through XII grossly intact, motor grossly intact throughout PSYCH:  Cognitively intact, oriented to person place and time   EKG:  EKG is ordered today. 12/04/17: Sinus rhythm.  Rate 82 bmp.    Exercise Cardiolite 09/28/15:  Nuclear stress EF: 51%.  The left ventricular ejection fraction is mildly decreased (45-54%).  There was no ST segment deviation noted during stress.  The study is normal.  Normal stress nuclear study with no ischemia or infarction; low normal LV function with EF 51 and normal wall motion.  Recent Labs: 05/03/2018: ALT 9 08/16/2018: BUN 21; Creat 1.27; Magnesium 1.8; Potassium 4.5; Sodium 138; TSH 1.36  Lipid Panel    Component Value Date/Time   CHOL 183 08/02/2018 0817   TRIG 75 08/02/2018 0817   HDL 61 08/02/2018 0817   CHOLHDL 3.0 08/02/2018 0817   VLDL 17 02/27/2017 0844   LDLCALC 106 (H) 08/02/2018 0817      Wt Readings from Last 3 Encounters:  09/10/18 173 lb 6.4 oz (78.7 kg)  09/06/18 173 lb (78.5 kg)  08/16/18 172 lb 6.4 oz (78.2 kg)      ASSESSMENT AND PLAN:  # PVCs:  # Bradycardia: Followed by Dr. Curt Bears.  A 48 hour Holter was ordered to quantify his burden.  He hasn't get picked it up.  We will arrange.  Beta blocker was stopped 2/2 bradycardia.   # Hypertension:  Blood pressure is well-controlled.  Continue amlodipine and quinapril.  # Hyperlipidemia: LDL 106.  He is tolerating rosuvastatin three times per week.   # Mild aortic stenosis: Asymptomatic.  Mean gradient was 15 mmHg 06/2015.  It sounds as though he has moderate stenosis by exam.  We will repeat his echocardiogram.   Current medicines are reviewed at length with the patient today.  The patient does not have concerns regarding medicines.  The following changes have been made: none Labs/ tests  ordered today include:   No orders of the defined types were placed in this encounter.    Disposition:   FU with Devyne Hauger C. Oval Linsey, MD, [facility] in 1 year.    Signed, Lonn Im C. Oval Linsey, MD, [facility]  09/10/2018 8:28 AM    [place]

## 2018-09-16 ENCOUNTER — Ambulatory Visit (INDEPENDENT_AMBULATORY_CARE_PROVIDER_SITE_OTHER): Payer: Medicare Other

## 2018-09-16 DIAGNOSIS — I493 Ventricular premature depolarization: Secondary | ICD-10-CM

## 2018-09-27 ENCOUNTER — Encounter: Payer: Self-pay | Admitting: Family Medicine

## 2018-10-18 ENCOUNTER — Other Ambulatory Visit: Payer: Self-pay

## 2018-10-19 MED ORDER — NYSTATIN-TRIAMCINOLONE 100000-0.1 UNIT/GM-% EX CREA: TOPICAL_CREAM | CUTANEOUS | 0 refills | Status: DC

## 2018-10-20 ENCOUNTER — Telehealth: Payer: Self-pay | Admitting: Family Medicine

## 2018-10-20 ENCOUNTER — Other Ambulatory Visit: Payer: Self-pay | Admitting: Family Medicine

## 2018-10-20 MED ORDER — INSULIN PEN NEEDLE 31G X 5 MM MISC: 3 refills | Status: AC

## 2018-10-20 MED ORDER — INSULIN PEN NEEDLE 31G X 5 MM MISC: 3 refills | Status: DC

## 2018-10-20 NOTE — Telephone Encounter (Signed)
Copied from [place] [id number]. Topic: Quick Communication - Rx Refill/Question >> Oct 20, 2018  4:15 PM Sheppard Coil, [employer] wrote: Medication: Insulin Pen Needle (B-D UF III MINI PEN NEEDLES) 31G X 5 MM MISC  Has the patient contacted their pharmacy? Yes - needs script (Agent: If no, request that the patient contact the pharmacy for the refill.) (Agent: If yes, when and what did the pharmacy advise?)  Preferred Pharmacy (with phone number or street name): [id number] [address], [place]. [phone] (Phone) [phone] (Fax)  Agent: Please be advised that RX refills may take up to 3 business days. We ask that you follow-up with your pharmacy.

## 2018-10-20 NOTE — Telephone Encounter (Signed)
Sent!

## 2018-10-20 NOTE — Telephone Encounter (Signed)
Pt is totally out per the nurse and is at the pharm that is in the note from [facility] to see if he can get these today

## 2018-10-21 ENCOUNTER — Other Ambulatory Visit: Payer: Self-pay | Admitting: Family Medicine

## 2018-10-30 ENCOUNTER — Other Ambulatory Visit: Payer: Self-pay | Admitting: Family Medicine

## 2018-10-30 NOTE — Telephone Encounter (Signed)
Lab Results  Component Value Date   CREATININE 1.27 (H) 08/16/2018   Lab Results  Component Value Date   K 4.5 08/16/2018

## 2018-11-01 ENCOUNTER — Ambulatory Visit: Payer: Medicare Other | Admitting: Family Medicine

## 2018-11-01 ENCOUNTER — Other Ambulatory Visit: Payer: Self-pay | Admitting: Family Medicine

## 2018-11-01 ENCOUNTER — Encounter: Payer: Self-pay | Admitting: Family Medicine

## 2018-11-01 VITALS — BP 120/78 | HR 93 | Temp 98.4°F | Ht 70.0 in | Wt 173.1 lb

## 2018-11-01 DIAGNOSIS — D631 Anemia in chronic kidney disease: Secondary | ICD-10-CM

## 2018-11-01 DIAGNOSIS — IMO0002 Reserved for concepts with insufficient information to code with codable children: Secondary | ICD-10-CM

## 2018-11-01 DIAGNOSIS — N183 Chronic kidney disease, stage 3 unspecified: Secondary | ICD-10-CM

## 2018-11-01 DIAGNOSIS — I10 Essential (primary) hypertension: Secondary | ICD-10-CM

## 2018-11-01 DIAGNOSIS — E1165 Type 2 diabetes mellitus with hyperglycemia: Secondary | ICD-10-CM

## 2018-11-01 DIAGNOSIS — E1121 Type 2 diabetes mellitus with diabetic nephropathy: Secondary | ICD-10-CM

## 2018-11-01 DIAGNOSIS — I350 Nonrheumatic aortic (valve) stenosis: Secondary | ICD-10-CM

## 2018-11-01 DIAGNOSIS — Z5181 Encounter for therapeutic drug level monitoring: Secondary | ICD-10-CM

## 2018-11-01 DIAGNOSIS — E785 Hyperlipidemia, unspecified: Secondary | ICD-10-CM | POA: Diagnosis not present

## 2018-11-01 DIAGNOSIS — D696 Thrombocytopenia, unspecified: Secondary | ICD-10-CM

## 2018-11-01 NOTE — Patient Instructions (Signed)
We'll get labs today If you have not heard anything from my staff in a week about any orders/referrals/studies from today, please contact us here to follow-up [phone] Do schedule an echocardiogram with your heart doctor for late April or early May

## 2018-11-01 NOTE — Assessment & Plan Note (Signed)
Check A1c today; range of insulin is 15-18 units long-acting; avoid sugary drinks; foot exam by MD today

## 2018-11-01 NOTE — Assessment & Plan Note (Signed)
Check liver function 

## 2018-11-01 NOTE — Progress Notes (Signed)
Ordering stat labs to be done at the hospital on Tues march 10

## 2018-11-01 NOTE — Assessment & Plan Note (Signed)
Limit animal proteins which tend to have a lot of fat; check lipids today

## 2018-11-01 NOTE — Progress Notes (Signed)
BP 120/78   Pulse 93   Temp 98.4 F (36.9 C)   Ht 5\' 10"  (1.778 m)   Wt 173 lb 1.6 oz (78.5 kg)   SpO2 97%   BMI 24.84 kg/m    Subjective:    Patient ID: Brian [name], male    DOB: [birth year], 85 y.o.   MRN: [id number]  HPI: Brian [name] is a 85 y.o. male  Chief Complaint  Patient presents with  . Follow-up    HPI Here for f/u FSBS this morning 142; taking all his medicine, took 15 units of insulin last night; goes up to 15-18 units at night, depending; no problems with feet Checking FSBS 2-3 x a day depending Lab Results  Component Value Date   HGBA1C 8.7 (H) 08/02/2018   Hypertension; controlled today; wife checks at home, nothing out of range; trying to limit salt  High cholesterol; discussed diet; no eggs; mostly cereal in the morning; eats hamburgers sometimes; chicken and fish sometimes; avoiding shellfish; taking fish oil and rosuvatatin 3x a week; no bad muscle aches  Lab Results  Component Value Date   CHOL 183 08/02/2018   HDL 61 08/02/2018   LDLCALC 106 (H) 08/02/2018   TRIG 75 08/02/2018   CHOLHDL 3.0 08/02/2018   CKD stage 3, anemia of chronic disease; seeing Dr. Juleen China; avoiding NSAIDs; drinking plenty of water  Aortic stenosis; seeing Dr. Oval Linsey, cardiologist; they are monitoring that; wore a holter monitor; no afib on the report, Sep 20, 2018  Depression screen [facility] 2/9 11/01/2018 08/02/2018 05/03/2018 04/30/2018 02/22/2018  Decreased Interest 0 0 0 0 0  Down, Depressed, Hopeless 0 0 0 0 0  PHQ - 2 Score 0 0 0 0 0  Altered sleeping 0 0 - 0 -  Tired, decreased energy 0 0 - 0 -  Change in appetite 0 0 - 0 -  Feeling bad or failure about yourself  0 0 - 0 -  Trouble concentrating 0 0 - 0 -  Moving slowly or fidgety/restless 0 0 - 0 -  Suicidal thoughts 0 0 - 0 -  PHQ-9 Score 0 0 - 0 -  Difficult doing work/chores - - - Not difficult at all -   Fall Risk  11/01/2018 08/02/2018 05/03/2018 04/30/2018 02/22/2018  Falls in the past year? 0 0 No No No    Risk for fall due to : - - - Impaired vision -  Risk for fall due to: Comment - - - wears eyeglasses -    Relevant past medical, surgical, family and social history reviewed Past Medical History:  Diagnosis Date  . Anemia of chronic renal failure 05/08/2015  . Aortic stenosis, mild 09/21/2015  . Arthritis   . Bladder stone   . Chronic kidney disease (CKD), stage III (moderate) ([place]) 05/08/2015   Followed by Dr. Rolly Salter   . Diastolic dysfunction without heart failure 06/08/2017  . History of bladder stone   . Hyperlipidemia   . Hypertension   . Nocturia   . Renal disorder   . Type 2 diabetes mellitus ([place])    Past Surgical History:  Procedure Laterality Date  . CYSTOSCOPY/RETROGRADE/URETEROSCOPY/STONE EXTRACTION WITH BASKET N/A 03/22/2013   Procedure: CYSTOSCOPY BLADDER STONE STONE EXTRACTION;  Surgeon: [name], MD;  Location: [facility];  Service: Urology;  Laterality: N/A;  CYSTOSCOPY    . TONSILLECTOMY     Family History  Problem Relation Age of Onset  . Congestive Heart Failure Other   .  Heart attack Other   . Heart disease Other   . Diabetes Brother    Social History   Tobacco Use  . Smoking status: Former Smoker    Packs/day: 0.75    Years: 39.00    Pack years: 29.25    Types: Cigarettes    Last attempt to quit: 03/21/1993    Years since quitting: 25.6  . Smokeless tobacco: Never Used  . Tobacco comment: smoking cessation materials not required  Substance Use Topics  . Alcohol use: No    Alcohol/week: 0.0 standard drinks  . Drug use: No     Office Visit from 11/01/2018 in [facility]  AUDIT-C Score  0      Interim medical history since last visit reviewed. Allergies and medications reviewed  Review of Systems Per HPI unless specifically indicated above     Objective:    BP 120/78   Pulse 93   Temp 98.4 F (36.9 C)   Ht 5\' 10"  (1.778 m)   Wt 173 lb 1.6 oz (78.5 kg)   SpO2 97%   BMI 24.84 kg/m   Wt  Readings from Last 3 Encounters:  11/01/18 173 lb 1.6 oz (78.5 kg)  09/10/18 173 lb 6.4 oz (78.7 kg)  09/06/18 173 lb (78.5 kg)    Physical Exam Constitutional:      General: He is not in acute distress.    Appearance: He is well-developed.  HENT:     Head: Normocephalic and atraumatic.  Eyes:     General: No scleral icterus. Neck:     Thyroid: No thyromegaly.  Cardiovascular:     Rate and Rhythm: Normal rate and regular rhythm.     Heart sounds: Murmur present. Systolic murmur present with a grade of 3/6.  Pulmonary:     Effort: Pulmonary effort is normal.     Breath sounds: Normal breath sounds.  Abdominal:     General: Bowel sounds are normal. There is no distension.     Palpations: Abdomen is soft.  Skin:    General: Skin is warm and dry.     Coloration: Skin is not pale.  Neurological:     Mental Status: He is alert.     Coordination: Coordination normal.  Psychiatric:        Behavior: Behavior normal.        Thought Content: Thought content normal.        Judgment: Judgment normal.     Results for orders placed or performed in visit on 75/17/00  BASIC METABOLIC PANEL WITH GFR  Result Value Ref Range   Glucose, Bld 209 (H) 65 - 99 mg/dL   BUN 21 7 - 25 mg/dL   Creat 1.27 (H) 0.70 - 1.11 mg/dL   GFR, Est Non African American 52 (L) > OR = 60 mL/min/1.41m2   GFR, Est African American 61 > OR = 60 mL/min/1.79m2   BUN/Creatinine Ratio 17 6 - 22 (calc)   Sodium 138 135 - 146 mmol/L   Potassium 4.5 3.5 - 5.3 mmol/L   Chloride 105 98 - 110 mmol/L   CO2 28 20 - 32 mmol/L   Calcium 9.7 8.6 - 10.3 mg/dL  Magnesium  Result Value Ref Range   Magnesium 1.8 1.5 - 2.5 mg/dL  TSH  Result Value Ref Range   TSH 1.36 0.40 - 4.50 mIU/L  T4, free  Result Value Ref Range   Free T4 0.9 0.8 - 1.8 ng/dL      Assessment &  Plan:   Problem List Items Addressed This Visit      Cardiovascular and Mediastinum   Hypertension goal BP (blood pressure) < 140/90 (Chronic)     Well-controlled today; limit salt; check Cr and K+      Aortic stenosis, moderate    Now moderate based on last echo; due for echo in April, reviewed with patient, managed by cardiologist        Endocrine   Uncontrolled type 2 diabetes mellitus with diabetic nephropathy ([place]) - Primary    Check A1c today; range of insulin is 15-18 units long-acting; avoid sugary drinks; foot exam by MD today      Relevant Orders   Hemoglobin A1c     Genitourinary   Anemia of chronic renal failure (Chronic)   Relevant Orders   CBC   Chronic kidney disease (CKD), stage III (moderate) (HCC) (Chronic)     Other   Hyperlipidemia LDL goal <100 (Chronic)    Limit animal proteins which tend to have a lot of fat; check lipids today      Relevant Orders   Lipid panel   Encounter for medication monitoring    Check liver function      Relevant Orders   COMPLETE METABOLIC PANEL WITH GFR       Follow up plan: Return in about 3 months (around 02/01/2019) for follow-up visit with Dr. Sanda Klein (or just after).  An after-visit summary was printed and given to the patient at [place].  Please see the patient instructions which may contain other information and recommendations beyond what is mentioned above in the assessment and plan.  No orders of the defined types were placed in this encounter.   Orders Placed This Encounter  Procedures  . CBC  . COMPLETE METABOLIC PANEL WITH GFR  . Hemoglobin A1c  . Lipid panel

## 2018-11-01 NOTE — Assessment & Plan Note (Signed)
Well-controlled today; limit salt; check Cr and K+

## 2018-11-01 NOTE — Assessment & Plan Note (Signed)
Now moderate based on last echo; due for echo in April, reviewed with patient, managed by cardiologist

## 2018-11-02 ENCOUNTER — Other Ambulatory Visit (HOSPITAL_COMMUNITY)
Admission: RE | Admit: 2018-11-02 | Discharge: 2018-11-02 | Disposition: A | Discharge status: Home / Self Care | Payer: Medicare Other | Source: Other Acute Inpatient Hospital | Attending: Family Medicine | Admitting: Family Medicine

## 2018-11-02 ENCOUNTER — Other Ambulatory Visit: Payer: Self-pay

## 2018-11-02 ENCOUNTER — Telehealth: Payer: Self-pay | Admitting: Family Medicine

## 2018-11-02 ENCOUNTER — Telehealth: Payer: Self-pay

## 2018-11-02 DIAGNOSIS — I350 Nonrheumatic aortic (valve) stenosis: Secondary | ICD-10-CM | POA: Diagnosis not present

## 2018-11-02 DIAGNOSIS — N183 Chronic kidney disease, stage 3 unspecified: Secondary | ICD-10-CM

## 2018-11-02 DIAGNOSIS — E1165 Type 2 diabetes mellitus with hyperglycemia: Principal | ICD-10-CM

## 2018-11-02 DIAGNOSIS — E1121 Type 2 diabetes mellitus with diabetic nephropathy: Secondary | ICD-10-CM

## 2018-11-02 DIAGNOSIS — D631 Anemia in chronic kidney disease: Secondary | ICD-10-CM

## 2018-11-02 DIAGNOSIS — D696 Thrombocytopenia, unspecified: Secondary | ICD-10-CM | POA: Insufficient documentation

## 2018-11-02 DIAGNOSIS — IMO0002 Reserved for concepts with insufficient information to code with codable children: Secondary | ICD-10-CM

## 2018-11-02 LAB — COMPLETE METABOLIC PANEL WITH GFR
AG Ratio: 1.4 (calc) (ref 1.0–2.5)
ALT: 10 U/L (ref 9–46)
AST: 18 U/L (ref 10–35)
Albumin: 3.9 g/dL (ref 3.6–5.1)
Alkaline phosphatase (APISO): 46 U/L (ref 35–144)
BUN/Creatinine Ratio: 14 (calc) (ref 6–22)
BUN: 19 mg/dL (ref 7–25)
CO2: 24 mmol/L (ref 20–32)
Calcium: 9.2 mg/dL (ref 8.6–10.3)
Chloride: 106 mmol/L (ref 98–110)
Creat: 1.38 mg/dL — ABNORMAL HIGH (ref 0.70–1.11)
GFR, Est African American: 54 mL/min/{1.73_m2} — ABNORMAL LOW (ref >=60)
GFR, Est Non African American: 47 mL/min/{1.73_m2} — ABNORMAL LOW (ref >=60)
Globulin: 2.7 g/dL (calc) (ref 1.9–3.7)
Glucose, Bld: 147 mg/dL — ABNORMAL HIGH (ref 65–99)
Potassium: 4.1 mmol/L (ref 3.5–5.3)
Sodium: 139 mmol/L (ref 135–146)
Total Bilirubin: 0.4 mg/dL (ref 0.2–1.2)
Total Protein: 6.6 g/dL (ref 6.1–8.1)

## 2018-11-02 LAB — CBC WITH DIFFERENTIAL/PLATELET
Abs Immature Granulocytes: 0.02 10*3/uL (ref 0.00–0.07)
Basophils Absolute: 0 10*3/uL (ref 0.0–0.1)
Basophils Relative: 1 %
Eosinophils Absolute: 0.1 10*3/uL (ref 0.0–0.5)
Eosinophils Relative: 3 %
HCT: 36.7 % — ABNORMAL LOW (ref 39.0–52.0)
Hemoglobin: 11.6 g/dL — ABNORMAL LOW (ref 13.0–17.0)
Immature Granulocytes: 1 %
Lymphocytes Relative: 33 %
Lymphs Abs: 1.4 10*3/uL (ref 0.7–4.0)
MCH: 28.4 pg (ref 26.0–34.0)
MCHC: 31.6 g/dL (ref 30.0–36.0)
MCV: 90 fL (ref 80.0–100.0)
Monocytes Absolute: 0.4 10*3/uL (ref 0.1–1.0)
Monocytes Relative: 10 %
Neutro Abs: 2.2 10*3/uL (ref 1.7–7.7)
Neutrophils Relative %: 52 %
Platelets: 173 10*3/uL (ref 150–400)
RBC: 4.08 MIL/uL — ABNORMAL LOW (ref 4.22–5.81)
RDW: 14.4 % (ref 11.5–15.5)
WBC: 4.1 10*3/uL (ref 4.0–10.5)
nRBC: 0 % (ref 0.0–0.2)

## 2018-11-02 LAB — CBC
HCT: 37.5 % — ABNORMAL LOW (ref 38.5–50.0)
Hemoglobin: 12.4 g/dL — ABNORMAL LOW (ref 13.2–17.1)
MCH: 29.3 pg (ref 27.0–33.0)
MCHC: 33.1 g/dL (ref 32.0–36.0)
MCV: 88.7 fL (ref 80.0–100.0)
MPV: 11.2 fL (ref 7.5–12.5)
Platelets: 91 10*3/uL — ABNORMAL LOW (ref 140–400)
RBC: 4.23 10*6/uL (ref 4.20–5.80)
RDW: 13.6 % (ref 11.0–15.0)
WBC: 5 10*3/uL (ref 3.8–10.8)

## 2018-11-02 LAB — LIPID PANEL
Cholesterol: 202 mg/dL — ABNORMAL HIGH (ref <200)
HDL: 77 mg/dL (ref >=40)
LDL CHOLESTEROL (CALC): 108 mg/dL — AB
Non-HDL Cholesterol (Calc): 125 mg/dL (calc) (ref <130)
Total CHOL/HDL Ratio: 2.6 (calc) (ref <5.0)
Triglycerides: 80 mg/dL (ref <150)

## 2018-11-02 LAB — HEMOGLOBIN A1C
Hgb A1c MFr Bld: 9.1 % of total Hgb — ABNORMAL HIGH (ref <5.7)
Mean Plasma Glucose: 214 (calc)
eAG (mmol/L): 11.9 (calc)

## 2018-11-02 NOTE — Telephone Encounter (Signed)
Left detailed voicemail

## 2018-11-02 NOTE — Telephone Encounter (Signed)
Please let the patient know we appreciate him having labs done His platelets are fine Other labs pending

## 2018-11-02 NOTE — Telephone Encounter (Signed)
-----   Message from Arnetha Courser, MD sent at 11/02/2018  6:16 AM EDT ----- Brian [name], please see notes about STAT labs for CBC section Also... let him know know that his kidney function is stable; reduced compared to when he was 85 years old, but stable; avoid NSAIDs and stay hydrated His diabetes is out of control; REFER to endocrinology for management of uncontrolled diabetes; I can recommend another medicine which is an injection if he's interested until he can see the specialist; he should not take if any hx of medullary thyroid carcinoma or fam hx of MEN-2; it has been linked to thyroid cancer; if he is willing to take, back to me for Rx; if not, really watch diet and increase insulin by THREE units (so if he's doing 18 units, go to 21 units daily) His LDL is not at goal; ask him if he will please try the statin four nights a week and consider going vegetarian or vegan

## 2018-11-04 LAB — PATHOLOGIST SMEAR REVIEW

## 2018-11-06 ENCOUNTER — Other Ambulatory Visit: Payer: Self-pay | Admitting: Family Medicine

## 2018-11-24 ENCOUNTER — Other Ambulatory Visit: Payer: Self-pay | Admitting: Family Medicine

## 2018-11-24 NOTE — Telephone Encounter (Signed)
I reviewed last lab results Patient was going to increase to four nights a week New Rx sent

## 2018-11-30 ENCOUNTER — Other Ambulatory Visit: Payer: Self-pay | Admitting: Family Medicine

## 2018-12-15 ENCOUNTER — Telehealth: Payer: Self-pay | Admitting: Cardiology

## 2018-12-15 NOTE — Telephone Encounter (Signed)
   Primary Cardiologist: Dr. Oval Linsey   Patient contacted.  History reviewed.  No symptoms to suggest any unstable cardiac conditions.  Based on discussion, with current pandemic situation, we will be postponing 2D echo  appointment for Brian [name] with a plan for f/u in 6-8 wks or sooner if feasible/necessary.  If symptoms change, he has been instructed to contact our office.   Routing to C19 CANCEL pool for tracking (P CV DIV CV19 CANCEL - reason for visit "other.") and assigning priority (1 = 4-6 wks, 2 = 6-12 wks, 3 = >12 wks).   Brian Him, MD  12/15/2018 3:56 PM         .

## 2018-12-17 ENCOUNTER — Ambulatory Visit (HOSPITAL_COMMUNITY): Payer: Medicare Other

## 2018-12-18 ENCOUNTER — Other Ambulatory Visit: Payer: Self-pay | Admitting: Family Medicine

## 2018-12-18 DIAGNOSIS — L309 Dermatitis, unspecified: Secondary | ICD-10-CM

## 2019-01-11 ENCOUNTER — Telehealth: Payer: Self-pay | Admitting: Family Medicine

## 2019-01-11 ENCOUNTER — Other Ambulatory Visit: Payer: Self-pay | Admitting: Nurse Practitioner

## 2019-01-11 MED ORDER — SILDENAFIL CITRATE 100 MG PO TABS: 50.0000 mg | ORAL_TABLET | Freq: Every day | ORAL | 5 refills | Status: DC | PRN

## 2019-01-11 NOTE — Telephone Encounter (Signed)
Pt has refills.

## 2019-01-11 NOTE — Telephone Encounter (Signed)
Copied from [place] [id number]. Topic: Quick Communication - Rx Refill/Question >> Jan 11, 2019 10:56 AM Oneta Rack wrote: Medication: tamsulosin (FLOMAX) 0.4 MG CAPS capsule   Preferred Pharmacy (with phone number or street name):   [id number] [place], [place]. Agent: Please be advised that RX refills may take up to 3 business days. We ask that you follow-up with your pharmacy.

## 2019-01-14 ENCOUNTER — Other Ambulatory Visit: Payer: Self-pay | Admitting: Nurse Practitioner

## 2019-01-21 ENCOUNTER — Other Ambulatory Visit: Payer: Self-pay | Admitting: Family Medicine

## 2019-01-21 DIAGNOSIS — L309 Dermatitis, unspecified: Secondary | ICD-10-CM

## 2019-01-29 ENCOUNTER — Other Ambulatory Visit: Payer: Self-pay | Admitting: Family Medicine

## 2019-01-31 ENCOUNTER — Other Ambulatory Visit: Payer: Self-pay | Admitting: Family Medicine

## 2019-01-31 DIAGNOSIS — L309 Dermatitis, unspecified: Secondary | ICD-10-CM

## 2019-01-31 NOTE — Telephone Encounter (Signed)
nystatin-triamcinolone (MYCOLOG II) cream   Pt called to f/u on requested. Advised to allow 3 business days. Appt scheduled with [facility] 6/16  [id number] [place], [place] - [address]

## 2019-01-31 NOTE — Telephone Encounter (Signed)
LEVEMIR FLEXTOUCH 100 UNIT/ML Pen [Pharmacy Med Name: LEVEMIR FLEXTOUCH 100 UNIT/ML]   Pt called to follow up on RX. He has a few days left.  [id number] [place], [place].

## 2019-02-03 ENCOUNTER — Ambulatory Visit: Payer: Medicare Other | Admitting: Family Medicine

## 2019-02-08 ENCOUNTER — Ambulatory Visit: Payer: Medicare Other | Admitting: Nurse Practitioner

## 2019-02-08 ENCOUNTER — Encounter: Payer: Self-pay | Admitting: Nurse Practitioner

## 2019-02-08 ENCOUNTER — Other Ambulatory Visit: Payer: Self-pay

## 2019-02-08 ENCOUNTER — Ambulatory Visit: Payer: Self-pay

## 2019-02-08 VITALS — BP 118/64 | HR 87 | Temp 98.3°F | Resp 14 | Ht 70.0 in | Wt 169.4 lb

## 2019-02-08 DIAGNOSIS — E785 Hyperlipidemia, unspecified: Secondary | ICD-10-CM

## 2019-02-08 DIAGNOSIS — K581 Irritable bowel syndrome with constipation: Secondary | ICD-10-CM | POA: Diagnosis not present

## 2019-02-08 DIAGNOSIS — E1121 Type 2 diabetes mellitus with diabetic nephropathy: Secondary | ICD-10-CM

## 2019-02-08 DIAGNOSIS — E1165 Type 2 diabetes mellitus with hyperglycemia: Secondary | ICD-10-CM

## 2019-02-08 DIAGNOSIS — I10 Essential (primary) hypertension: Secondary | ICD-10-CM | POA: Diagnosis not present

## 2019-02-08 DIAGNOSIS — N183 Chronic kidney disease, stage 3 unspecified: Secondary | ICD-10-CM

## 2019-02-08 DIAGNOSIS — IMO0002 Reserved for concepts with insufficient information to code with codable children: Secondary | ICD-10-CM

## 2019-02-08 NOTE — Chronic Care Management (AMB) (Signed)
   Chronic Care Management   Unsuccessful Call Note 02/08/2019 Name: RANGEL ECHEVERRI MRN: [id number] DOB: [birth year]  Mr. Alisa Graff. Eisenhour is a 85 year old male who sees [facility] for primary care. Suezanne Cheshire, NP asked the CCM team to consult the patient for medication management secondary to DM control. Referral was placed today during office visit.   It is noted that patient previously was referred and consented to [facility] 07/2018 and was seen by [facility] Pharmacist.   Was unable to reach patient via telephone today for re-engagement with CCM Services. I have left HIPAA compliant voicemail asking patient to return my call. (unsuccessful outreach #1).   Plan: Will follow-up within 7 business days via telephone.      Shawnya Mayor E. Rollene Rotunda, RN, BSN Nurse Care Coordinator [facility] Care Management  [phone]

## 2019-02-08 NOTE — Progress Notes (Signed)
Name: Brian [name]   MRN: [id number]    DOB: [birth year]   Date:02/08/2019       Progress Note  Subjective  Chief Complaint  Chief Complaint  Patient presents with  . Follow-up    HPI  Hypertension Patient is on quinapril 61m and amlodipine 175mdaily.  Takes medications as prescribed with no missed doses a month.  He is compliant with low-salt diet.  Patient has erectile dysfunction- take viagra, and aortic stenosis- takes 8162mSA Denies chest pain, headaches, blurry vision.  Hyperlipidemia Patient rx crestor 5mg79mily, fish oil 1000mg80mly Takes medications as prescribed with no missed doses a month.  Denies myalgias Lab Results  Component Value Date   CHOL 202 (H) 11/01/2018   HDL 77 11/01/2018   LDLCALC 108 (H) 11/01/2018   TRIG 80 11/01/2018   CHOLHDL 2.6 11/01/2018    Diabetes Mellitus Patient was on rx actos 30mg 25my and levemir 18 units. Was sent to endocrinology due to A1C being out of control, he saw them in April and and his actos was cut to 15mg d39m and added on glimepiride 2mg dai35m he lowered his own levemir to 12 units and stopped the glimepiride due to hypoglycemia. He is concerned about taking actos due to heart failure he has diastolic dysfunction without heart failure.  Patient has chronic kidney disease stage 3. Checks blood sugars at home. Patients blood sugar was 153 this morning states he checked it 10 min later and it was 135. Lowest reading in the last month was 76 and highest reading was 153. When his sugar was 76 he felt shaky and dizzy and drank a soda to get it back up. He has had 3 readings under 80 in the last month.Typically fasting sugars are between 100-120. Patient denies personal or family history of thyroid cancers or pancreatitis. Denies polyphagia, polydipsia, polyuria.  Lab Results  Component Value Date   HGBA1C 9.1 (H) 11/01/2018    BPH Patient takes finasteride 5mg and 21mmax 0.4mg daily14mBS with constipation Patient  takes docusate sodium 100mg BID a12meded.   PHQ2/9: Depression screen PHQ 2/9 6/1[facility]020 11/01/2018 08/02/2018 05/03/2018 04/30/2018  Decreased Interest 0 0 0 0 0  Down, Depressed, Hopeless 0 0 0 0 0  PHQ - 2 Score 0 0 0 0 0  Altered sleeping 0 0 0 - 0  Tired, decreased energy 0 0 0 - 0  Change in appetite 0 0 0 - 0  Feeling bad or failure about yourself  0 0 0 - 0  Trouble concentrating 0 0 0 - 0  Moving slowly or fidgety/restless 0 0 0 - 0  Suicidal thoughts 0 0 0 - 0  PHQ-9 Score 0 0 0 - 0  Difficult doing work/chores Not difficult at all - - - Not difficult at all     PHQ reviewed. Negative  Patient Active Problem List   Diagnosis Date Noted  . Uncontrolled type 2 diabetes mellitus with diabetic nephropathy (HCC) 07/01/[place]9  . Heart murmur 11/04/2017  . Erectile dysfunction 09/10/2017  . Diastolic dysfunction without heart failure 06/08/2017  . Encounter for medication monitoring 05/05/2016  . Aortic stenosis, moderate 09/21/2015  . Hypertension goal BP (blood pressure) < 140/90 05/08/2015  . Hyperlipidemia LDL goal <100 05/08/2015  . Diverticulosis of colon 05/08/2015  . Secondary hyperparathyroidism of renal origin (HCC) 09/13/[place]6  . Chronic kidney disease (CKD), stage III (moderate) (HCC) 09/13/[place]6  . Irritable bowel syndrome with constipation 05/08/2015  . Anemia  of chronic renal failure 05/08/2015  . Benign localized prostatic hyperplasia with lower urinary tract symptoms (LUTS) 05/08/2015    Past Medical History:  Diagnosis Date  . Anemia of chronic renal failure 05/08/2015  . Aortic stenosis, mild 09/21/2015  . Arthritis   . Bladder stone   . Chronic kidney disease (CKD), stage III (moderate) ([place]) 05/08/2015   Followed by Dr. Rolly Salter   . Diastolic dysfunction without heart failure 06/08/2017  . History of bladder stone   . Hyperlipidemia   . Hypertension   . Nocturia   . Renal disorder   . Type 2 diabetes mellitus ([place])     Past Surgical History:   Procedure Laterality Date  . CYSTOSCOPY/RETROGRADE/URETEROSCOPY/STONE EXTRACTION WITH BASKET N/A 03/22/2013   Procedure: CYSTOSCOPY BLADDER STONE STONE EXTRACTION;  Surgeon: [name], MD;  Location: [facility];  Service: Urology;  Laterality: N/A;  CYSTOSCOPY    . TONSILLECTOMY      Social History   Tobacco Use  . Smoking status: Former Smoker    Packs/day: 0.75    Years: 39.00    Pack years: 29.25    Types: Cigarettes    Quit date: 03/21/1993    Years since quitting: 25.9  . Smokeless tobacco: Never Used  . Tobacco comment: smoking cessation materials not required  Substance Use Topics  . Alcohol use: No    Alcohol/week: 0.0 standard drinks     Current Outpatient Medications:  .  amLODipine (NORVASC) 10 MG tablet, Take 1 tablet (10 mg total) by mouth daily. (for high blood pressure), Disp: 90 tablet, Rfl: 3 .  aspirin EC 81 MG tablet, Take 81 mg by mouth daily. Reported on 09/17/2015, Disp: , Rfl:  .  docusate sodium (DOK) 100 MG capsule, Take 1 capsule (100 mg total) by mouth 2 (two) times daily as needed., Disp: 100 capsule, Rfl: 5 .  finasteride (PROSCAR) 5 MG tablet, TAKE ONE (1) TABLET EACH DAY, Disp: 90 tablet, Rfl: 3 .  LEVEMIR FLEXTOUCH 100 UNIT/ML Pen, INJECT 18 UNITS INTO THE SKIN DAILY., Disp: 15 mL, Rfl: 1 .  nystatin-triamcinolone (MYCOLOG II) cream, APPLY TO AFFECTED AREAS 3 TIMES DAILY AS NEEDED, Disp: 60 g, Rfl: 0 .  Omega-3 Fatty Acids (FISH OIL) 1000 MG CAPS, Take 1 capsule (1,000 mg total) by mouth daily., Disp: 90 capsule, Rfl: 3 .  pioglitazone (ACTOS) 30 MG tablet, TAKE 1 TABLET BY MOUTH EVERY DAY, Disp: 30 tablet, Rfl: 2 .  quinapril (ACCUPRIL) 20 MG tablet, TAKE 1 TABLET BY MOUTH TWICE A DAY, Disp: 60 tablet, Rfl: 8 .  rosuvastatin (CRESTOR) 5 MG tablet, TAKE ONE TAB BY MOUTH FOUR NIGHTS A WEEK, Disp: 52 tablet, Rfl: 1 .  sildenafil (VIAGRA) 100 MG tablet, Take 0.5-1 tablets (50-100 mg total) by mouth daily as needed for erectile  dysfunction., Disp: 10 tablet, Rfl: 5 .  tamsulosin (FLOMAX) 0.4 MG CAPS capsule, TAKE 1 CAPSULE BY MOUTH EVERY DAY, Disp: 90 capsule, Rfl: 1 .  Blood Glucose Monitoring Suppl (ONE TOUCH ULTRA 2) W/DEVICE KIT, , Disp: , Rfl:  .  glucose blood test strip, CHECK BG THREE TIMES DAILY AS DIRECTED  E11.29 Lon:99, Disp: 100 each, Rfl: 11 .  Insulin Pen Needle (B-D UF III MINI PEN NEEDLES) 31G X 5 MM MISC, USE AS DIRECTED WITH LEVEMIR INSULIN PENS AT BEDTIME, once a day, Disp: 100 each, Rfl: 3  Allergies  Allergen Reactions  . Carvedilol Other (See Comments)    bradycardia  . Niaspan [Niacin Er]   .  Shellfish Allergy Other (See Comments)    Pt vomits  . Statins Other (See Comments)    Myalgia  . Sulfa Antibiotics   . Zetia [Ezetimibe]   . Januvia [Sitagliptin] Rash    Review of Systems  Constitutional: Negative for chills, fever and malaise/fatigue.  HENT: Negative for congestion, sinus pain and sore throat.   Eyes: Negative for blurred vision.  Respiratory: Negative for cough and shortness of breath.   Cardiovascular: Negative for chest pain, palpitations and leg swelling.  Gastrointestinal: Negative for abdominal pain, constipation, diarrhea and nausea.  Genitourinary: Negative for dysuria.  Musculoskeletal: Negative for falls and joint pain.  Skin: Negative for rash.  Neurological: Negative for dizziness and headaches.  Endo/Heme/Allergies: Negative for polydipsia.  Psychiatric/Behavioral: The patient is not nervous/anxious and does not have insomnia.      No other specific complaints in a complete review of systems (except as listed in HPI above).  Objective  Vitals:   02/08/19 0835  BP: 118/64  Pulse: 87  Resp: 14  Temp: 98.3 F (36.8 C)  TempSrc: Oral  SpO2: 99%  Weight: 169 lb 6.4 oz (76.8 kg)  Height: _0  (1.778 m)    Body mass index is 24.31 kg/m.  Nursing Note and Vital Signs reviewed.  Physical Exam Constitutional:      Appearance: He is  well-developed. He is not diaphoretic.  HENT:     Head: Normocephalic and atraumatic.  Eyes:     Conjunctiva/sclera: Conjunctivae normal.  Cardiovascular:     Rate and Rhythm: Normal rate.     Pulses: Normal pulses.  Pulmonary:     Effort: Pulmonary effort is normal.  Skin:    General: Skin is warm and dry.     Findings: No erythema.  Neurological:     Mental Status: He is alert and oriented to person, place, and time.     Coordination: Coordination normal.  Psychiatric:        Behavior: Behavior normal.        Thought Content: Thought content normal.        Judgment: Judgment normal.        No results found for this or any previous visit (from the past 48 hour(s)).  Assessment & Plan  1. Hypertension goal BP (blood pressure) < 140/90 stable - Ambulatory referral to Chronic Care Management Services  2. Irritable bowel syndrome with constipation stable  3. Uncontrolled type 2 diabetes mellitus with diabetic nephropathy ([place]) Will work on getting patient on xultophy and transitioning him off levemir and actos. Discussed diabetes at great length. Due to financial issues will work on control with PCP, if unable to comply or have improvement in 6 months will return to endocrinology.  - HgB A1c - Ambulatory referral to Chronic Care Management Services  4. Hyperlipidemia LDL goal <100 LDL goal of under 70; will discuss at follow-up  - Ambulatory referral to Chronic Care Management Services  5. Chronic kidney disease (CKD), stage III (moderate) ([place]) Follow-up with nephrology, avoid NSAIDs   Face-to-face time with patient was more than 25 minutes, >50% time spent counseling and coordination of care

## 2019-02-08 NOTE — Patient Instructions (Addendum)
-   We will work on managing your diabetes here at [facility] for the next 6 months to help with cost and if we are unable to get you under better control we will refer you back to endocrinology.  - Expect a phone call from the the chronic care management team- either Portia (nurse) or Almyra Free (pharmacist) to help [place] manage your diabetes.

## 2019-02-09 ENCOUNTER — Ambulatory Visit: Payer: Self-pay

## 2019-02-09 ENCOUNTER — Telehealth (INDEPENDENT_AMBULATORY_CARE_PROVIDER_SITE_OTHER): Payer: Medicare Other | Admitting: Family Medicine

## 2019-02-09 DIAGNOSIS — E1165 Type 2 diabetes mellitus with hyperglycemia: Secondary | ICD-10-CM | POA: Diagnosis not present

## 2019-02-09 DIAGNOSIS — Z599 Problem related to housing and economic circumstances, unspecified: Secondary | ICD-10-CM

## 2019-02-09 DIAGNOSIS — E1121 Type 2 diabetes mellitus with diabetic nephropathy: Secondary | ICD-10-CM

## 2019-02-09 DIAGNOSIS — Z598 Other problems related to housing and economic circumstances: Secondary | ICD-10-CM

## 2019-02-09 DIAGNOSIS — IMO0002 Reserved for concepts with insufficient information to code with codable children: Secondary | ICD-10-CM

## 2019-02-09 LAB — HEMOGLOBIN A1C
Hgb A1c MFr Bld: 8.2 % of total Hgb — ABNORMAL HIGH (ref <5.7)
Mean Plasma Glucose: 189 (calc)
eAG (mmol/L): 10.4 (calc)

## 2019-02-09 MED ORDER — BLOOD GLUCOSE MONITOR KIT: PACK | 0 refills | Status: DC

## 2019-02-09 NOTE — Telephone Encounter (Signed)
Pt states that his diabetic meter is not working properly. He gets a reading and 3 minutes later he gets a much different reading. He said he discussed with someone at [facility] 6/16. He needs to have accurate numbers so he isn't spending $80 at a time on his medication and using it if he doesn't need to.   Pt would like call before sending in at [phone] and if no answer try home # [phone]  [id number] [place] [place].

## 2019-02-09 NOTE — Chronic Care Management (AMB) (Signed)
  Chronic Care Management   Follow Up Note   02/09/2019 Name: Brian [name] MRN: [id number] DOB: Sep 10, 1933  Referred by: Arnetha Courser, MD Reason for referral : Chronic Care Management (introduction to CCM services)   Subjective: I just can afford this insulin because I am just living off of social security   Objective:  Assessment: [name]. Brian [name] is a 85 year old male who sees providers at [facility] for his care. He has engaged with CCM services 04/2018 for medication affordability but lost to follow up.Suezanne Cheshire, NP placed another referral to CCM Team for assistance from Clinic Pharmacist for DM medication management. Today, RN CM outreached to patient to discuss re-engagement with CCM Team.  Patient states he cannot afford his medications. He states he tried to get established at the [place] years ago but told he made too much money? He is now living off his social security and struggling with his insulin cost. He has his DD124 papers. He is open to assistance from CCM Team in attempting to establish care at local [place] so his medications can be provided to him at no cost.   Review of patient status, including review of consultants reports, relevant laboratory and other test results, and collaboration with appropriate care team members and the patient's provider was performed as part of comprehensive patient evaluation and provision of chronic care management services.    Goals Addressed            This Visit's Progress   . I just can't afford my medicine (pt-stated)       Current Barriers:  . Lacks caregiver support.  . Film/video editor.  . Difficulty obtaining medications  Nurse Case Manager Clinical Goal(s):  Marland Kitchen Over the next 14 days, patient will work with LCSW to address needs related to [facility]  Interventions:  . Assessed if patient had DD2-14 papers from [employer] discharge . Assessed for  transportation barriers if patient has to go to [place] . Referred to [place] for assistance with getting established at [facility] . Collaborated with Suezanne Cheshire, NP to discuss plan to have patient established at [facility] for medication affordability  Patient Self Care Activities:  . Self administers medications as prescribed . Attends all scheduled provider appointments . Calls pharmacy for medication refills . Attends church or other social activities . Performs ADL's independently . Performs IADL's independently . Calls provider office for new concerns or questions  Initial goal documentation         Telephone follow up appointment with care management team member scheduled for: next week with LCSW    Jesus Poplin E. Rollene Rotunda, RN, BSN Nurse Care Coordinator [facility] Care Management  [phone]

## 2019-02-09 NOTE — Telephone Encounter (Signed)
Patient does need his medicine based off his lab results. I am happy to order a new machine, can fax order to his pharmacy

## 2019-02-11 NOTE — Telephone Encounter (Signed)
Left detailed VM with patient

## 2019-02-15 ENCOUNTER — Encounter: Payer: Self-pay | Admitting: *Deleted

## 2019-02-15 ENCOUNTER — Ambulatory Visit: Payer: Self-pay | Admitting: *Deleted

## 2019-02-15 ENCOUNTER — Telehealth: Payer: Self-pay

## 2019-02-15 DIAGNOSIS — E1121 Type 2 diabetes mellitus with diabetic nephropathy: Secondary | ICD-10-CM

## 2019-02-15 DIAGNOSIS — Z599 Problem related to housing and economic circumstances, unspecified: Secondary | ICD-10-CM

## 2019-02-15 DIAGNOSIS — IMO0002 Reserved for concepts with insufficient information to code with codable children: Secondary | ICD-10-CM

## 2019-02-15 DIAGNOSIS — Z598 Other problems related to housing and economic circumstances: Secondary | ICD-10-CM

## 2019-02-15 NOTE — Chronic Care Management (AMB) (Signed)
  Chronic Care Management    Clinical Social Work Follow Up Note  02/15/2019 Name: Brian [name] MRN: [id number] DOB: Feb 17, 1934  Brian [name] is a 85 y.o. year old male who is a primary care patient of [name], [name], MD. The CCM team was consulted for assistance with applying for health benefits through the Department of [facility].   Review of patient status, including review of consultants reports, other relevant assessments, and collaboration with appropriate care team members and the patient's provider was performed as part of comprehensive patient evaluation and provision of chronic care management services.     Goals Addressed            This Visit's Progress   . I just can't afford my medicine (pt-stated)       Current Barriers:  . Lacks caregiver support.  . Film/video editor.  . Difficulty obtaining medications  Social Work Clinical Goal(s):  Marland Kitchen Over the next 30 days, patient will work with LCSW to address needs related to process for [facility]  Interventions: . Application for Health Benefits reviewed and completed partially with patient . Confirmed that patient was not able to locate his DD 214 at the time of the call and agreed to call this social worker back when located to complete application process . Discussed medication affordability and difficulty affording his medication, specifically his insulin .  Marland Kitchen Patient Self Care Activities:  . Self administers medications as prescribed . Attends all scheduled provider appointments . Calls pharmacy for medication refills . Attends church or other social activities . Performs ADL's independently . Performs IADL's independently . Calls provider office for new concerns or questions  Initial goal documentation         Follow Up Plan: SW will follow up with patient by phone over the next 14 days   [place], [place] Worker  [facility]  Care Management [phone]

## 2019-02-15 NOTE — Progress Notes (Signed)
This encounter was created in error - please disregard.

## 2019-02-16 ENCOUNTER — Ambulatory Visit: Payer: Self-pay | Admitting: *Deleted

## 2019-02-16 DIAGNOSIS — Z598 Other problems related to housing and economic circumstances: Secondary | ICD-10-CM

## 2019-02-16 DIAGNOSIS — IMO0002 Reserved for concepts with insufficient information to code with codable children: Secondary | ICD-10-CM

## 2019-02-16 DIAGNOSIS — E1121 Type 2 diabetes mellitus with diabetic nephropathy: Secondary | ICD-10-CM

## 2019-02-16 DIAGNOSIS — Z599 Problem related to housing and economic circumstances, unspecified: Secondary | ICD-10-CM

## 2019-02-16 NOTE — Chronic Care Management (AMB) (Signed)
  Chronic Care Management    Clinical Social Work Follow Up Note  02/16/2019 Name: CADELL GABRIELSON MRN: [id number] DOB: [birth year]  [name] is a 85 y.o. year old male who is a primary care patient of [name], [name], MD. The CCM team was consulted for assistance with application for health benefits through the Department of [facility].  Review of patient status, including review of consultants reports, other relevant assessments, and collaboration with appropriate care team members and the patient's provider was performed as part of comprehensive patient evaluation and provision of chronic care management services.     Goals Addressed            This Visit's Progress   . I just can't afford my medicine (pt-stated)       Current Barriers:  . Lacks caregiver support.  . Film/video editor.  . Difficulty obtaining medications  Social Work Clinical Goal(s):  Marland Kitchen Over the next 30 days, patient will work with LCSW to address needs related to process for getting [facility] at local [facility]  Interventions: . Application for Health Benefits reviewed and completed partially with patient . Confirmed that patient has located his DD 214 and was able to provide information needed to complete the application. However there is information that his spouse needs to complete related to her income and out of pocket medical expenses paid in the past year . Discussed that patient's spouse can call this social worker back to provide the information needed. .  . Patient Self Care Activities:  . Self administers medications as prescribed . Attends all scheduled provider appointments . Calls pharmacy for medication refills . Attends church or other social activities . Performs ADL's independently . Performs IADL's independently . Calls provider office for new concerns or questions  Please see past updates related to this goal by clicking on the "Past Updates" button in the selected goal           Follow Up Plan: Client will have his spouse call this social worker back with the required financial infornmation needed to complete health benefit form   Sheralyn Boatman [facility] Care Management [phone]

## 2019-02-17 ENCOUNTER — Ambulatory Visit: Payer: Self-pay | Admitting: *Deleted

## 2019-02-17 DIAGNOSIS — Z598 Other problems related to housing and economic circumstances: Secondary | ICD-10-CM

## 2019-02-17 DIAGNOSIS — E1121 Type 2 diabetes mellitus with diabetic nephropathy: Secondary | ICD-10-CM

## 2019-02-17 DIAGNOSIS — IMO0002 Reserved for concepts with insufficient information to code with codable children: Secondary | ICD-10-CM

## 2019-02-17 DIAGNOSIS — Z599 Problem related to housing and economic circumstances, unspecified: Secondary | ICD-10-CM

## 2019-02-18 NOTE — Chronic Care Management (AMB) (Signed)
  Chronic Care Management    Clinical Social Work Follow Up Note  02/18/2019 Name: Brian [name] MRN: [id number] DOB: 01-02-1934  Brian [name] is a 85 y.o. year old male who is a primary care patient of [name], [name], MD. The CCM team was consulted for assistance with completing the application for Health Benefits through the Department of [facility] affairs.   Review of patient status, including review of consultants reports, other relevant assessments, and collaboration with appropriate care team members and the patient's provider was performed as part of comprehensive patient evaluation and provision of chronic care management services.     Goals Addressed            This Visit's Progress   . I just can't afford my medicine (pt-stated)       Current Barriers:  . Lacks caregiver support.  . Film/video editor.  . Difficulty obtaining medications  Social Work Clinical Goal(s):  Marland Kitchen Over the next 30 days, patient will work with LCSW to address needs related to process for getting established at local [facility]  Interventions: . Application for Health Benefits through [facility] reviewed and completed with patient today by phone . Confirmed with patient's spouse required information to complete the application for benefits through the department of [facility] . Confirmed that the application will need to be signed and returned to [facility] . Patient discussed possibly receiving assistance with his insurance company as well to assist with medication cost  .  . Patient Self Care Activities:  . Self administers medications as prescribed . Attends all scheduled provider appointments . Calls pharmacy for medication refills . Attends church or other social activities . Performs ADL's independently . Performs IADL's independently . Calls provider office for new concerns or questions  Please see past updates related to this goal by clicking on the "Past Updates" button  in the selected goal          Follow Up Plan: SW will follow up with patient by phone over the next [place], [place] Worker  [facility] Care Management [phone]

## 2019-02-18 NOTE — Patient Instructions (Signed)
Thank you allowing the Chronic Care Management Team to be a part of your care! It was a pleasure speaking with you today!  1. Please sign and return completed application for health benefits to:       [facility] [address]       [place], [place] [address]  CCM (Chronic Care Management) Team   Trish Fountain RN, BSN Nurse Care Coordinator  [phone]  Ruben Reason PharmD  Clinical Pharmacist  [phone]   Elliot Gurney, LCSW Clinical Social Worker [phone]  Goals Addressed            This Visit's Progress   . I just can't afford my medicine (pt-stated)       Current Barriers:  . Lacks caregiver support.  . Film/video editor.  . Difficulty obtaining medications  Social Work Clinical Goal(s):  Marland Kitchen Over the next 30 days, patient will work with LCSW to address needs related to process for getting [facility] at local [facility]  Interventions: . Application for Health Benefits through [facility] reviewed and completed with patient today by phone . Confirmed with patient's spouse required information to complete the application for benefits through the department of [facility] . Confirmed that the application will need to be signed and returned to [facility] . Patient discussed possibly receiving assistance with his insurance company as well to assist with medication cost  .  . Patient Self Care Activities:  . Self administers medications as prescribed . Attends all scheduled provider appointments . Calls pharmacy for medication refills . Attends church or other social activities . Performs ADL's independently . Performs IADL's independently . Calls provider office for new concerns or questions  Please see past updates related to this goal by clicking on the "Past Updates" button in the selected goal          The patient verbalized understanding of instructions provided today and declined a print copy of  patient instruction materials.   The care management team will reach out to the patient again over the next 14 days.

## 2019-02-23 ENCOUNTER — Encounter: Payer: Self-pay | Admitting: *Deleted

## 2019-02-23 ENCOUNTER — Ambulatory Visit: Payer: Self-pay | Admitting: *Deleted

## 2019-02-23 DIAGNOSIS — Z599 Problem related to housing and economic circumstances, unspecified: Secondary | ICD-10-CM

## 2019-02-23 DIAGNOSIS — IMO0002 Reserved for concepts with insufficient information to code with codable children: Secondary | ICD-10-CM

## 2019-02-23 DIAGNOSIS — Z598 Other problems related to housing and economic circumstances: Secondary | ICD-10-CM

## 2019-02-23 DIAGNOSIS — E1121 Type 2 diabetes mellitus with diabetic nephropathy: Secondary | ICD-10-CM

## 2019-02-23 NOTE — Progress Notes (Signed)
This encounter was created in error - please disregard.

## 2019-02-24 NOTE — Chronic Care Management (AMB) (Signed)
  Chronic Care Management    Clinical Social Work Follow Up Note  02/24/2019 Name: Brian [name] MRN: [id number] DOB: 09-11-1933  Brian [name] is a 85 y.o. year old male who is a primary care patient of [name], [name], MD. The CCM team was consulted for assistance with application for health benefits through the Department of [facility].   Review of patient status, including review of consultants reports, other relevant assessments, and collaboration with appropriate care team members and the patient's provider was performed as part of comprehensive patient evaluation and provision of chronic care management services.     Goals Addressed            This Visit's Progress   . I just can't afford my medicine (pt-stated)       Current Barriers:  . Lacks caregiver support.  . Film/video editor.  . Difficulty obtaining medications  Social Work Clinical Goal(s):  Marland Kitchen Over the next 30 days, patient will work with LCSW to address needs related to process for getting [facility] at local [facility]  Interventions: Marland Kitchen Economist for Health Benefits through [facility] to patient's home to sign and return to [facility] for processing . Return envelope enclosed   .  Marland Kitchen Patient Self Care Activities:  . Self administers medications as prescribed . Attends all scheduled provider appointments . Calls pharmacy for medication refills . Attends church or other social activities . Performs ADL's independently . Performs IADL's independently . Calls provider office for new concerns or questions  Please see past updates related to this goal by clicking on the "Past Updates" button in the selected goal          Follow Up Plan: SW will follow up with patient by phone over the next 2 weeks   [place], [place] Worker  [facility] Care Management [phone]

## 2019-02-24 NOTE — Patient Instructions (Addendum)
Thank you allowing the Chronic Care Management Team to be a part of your care!   1. Please sign and mail the application for health benefits back to [facility] using enclosed envelope once received     CCM (Chronic Care Management) Team   Trish Fountain RN, BSN Nurse Care Coordinator  [phone]  Ruben Reason PharmD  Clinical Pharmacist  [phone]   [place], [place] Social Worker [phone]  Goals Addressed            This Visit's Progress   . I just can't afford my medicine (pt-stated)       Current Barriers:  . Lacks caregiver support.  . Film/video editor.  . Difficulty obtaining medications  Social Work Clinical Goal(s):  Marland Kitchen Over the next 30 days, patient will work with LCSW to address needs related to process for getting [facility] at local [facility]  Interventions: Marland Kitchen Economist for Health Benefits through [facility] to patient's home to sign and return to [facility] for processing . Return envelope enclosed   .  Marland Kitchen Patient Self Care Activities:  . Self administers medications as prescribed . Attends all scheduled provider appointments . Calls pharmacy for medication refills . Attends church or other social activities . Performs ADL's independently . Performs IADL's independently . Calls provider office for new concerns or questions  Please see past updates related to this goal by clicking on the "Past Updates" button in the selected goal          The patient verbalized understanding of instructions provided today and declined a print copy of patient instruction materials.   The care management team will reach out to the patient again over the next 14 days.

## 2019-03-01 ENCOUNTER — Telehealth: Payer: Self-pay

## 2019-03-05 ENCOUNTER — Other Ambulatory Visit: Payer: Self-pay | Admitting: Nurse Practitioner

## 2019-03-08 ENCOUNTER — Ambulatory Visit: Payer: Self-pay | Admitting: *Deleted

## 2019-03-08 DIAGNOSIS — Z599 Problem related to housing and economic circumstances, unspecified: Secondary | ICD-10-CM

## 2019-03-08 DIAGNOSIS — IMO0002 Reserved for concepts with insufficient information to code with codable children: Secondary | ICD-10-CM

## 2019-03-08 DIAGNOSIS — E1121 Type 2 diabetes mellitus with diabetic nephropathy: Secondary | ICD-10-CM

## 2019-03-08 DIAGNOSIS — Z598 Other problems related to housing and economic circumstances: Secondary | ICD-10-CM

## 2019-03-08 NOTE — Patient Instructions (Signed)
Thank you allowing the Chronic Care Management Team to be a part of your care! It was a pleasure speaking with you today!  1. Please watch mail for [employer] application 2. Please contact your CCM social worker if application has been received.  CCM (Chronic Care Management) Team   Trish Fountain RN, BSN Nurse Care Coordinator  [phone]  Ruben Reason PharmD  Clinical Pharmacist  [phone]   Elliot Gurney, LCSW Clinical Social Worker [phone]  Goals Addressed            This Visit's Progress   . I just can't afford my medicine (pt-stated)       Current Barriers:  . Lacks caregiver support.  . Film/video editor.  . Difficulty obtaining medications  Social Work Clinical Goal(s):  Marland Kitchen Over the next 30 days, patient will work with LCSW to address needs related to process for getting established at local [facility]  Interventions: . Confirmed with patient that he has not received Application for Health Benefits through [facility] mailed to his home to sign and return to [facility] for processing . Patient confirmed that he has been having trouble with his mail lately . Confirmed that this social worker will follow up by the end of the week to ensure that he received the mailing . Confirmed that if not received, application may have to be completed again   .  Marland Kitchen Patient Self Care Activities:  . Self administers medications as prescribed . Attends all scheduled provider appointments . Calls pharmacy for medication refills . Attends church or other social activities . Performs ADL's independently . Performs IADL's independently . Calls provider office for new concerns or questions  Please see past updates related to this goal by clicking on the "Past Updates" button in the selected goal          The patient verbalized understanding of instructions provided today and declined a print copy of patient instruction materials.   Telephone  follow up appointment with care management team member scheduled for:03/11/19

## 2019-03-08 NOTE — Chronic Care Management (AMB) (Signed)
  Chronic Care Management    Clinical Social Work Follow Up Note  03/08/2019 Name: Brian [name] MRN: [id number] DOB: 1934/08/20  Brian [name] is a 85 y.o. year old male who is a primary care patient of [name], [name], MD. The CCM team was consulted for assistance with application process for Veterans Benefits   Review of patient status, including review of consultants reports, other relevant assessments, and collaboration with appropriate care team members and the patient's provider was performed as part of comprehensive patient evaluation and provision of chronic care management services.     Goals Addressed            This Visit's Progress   . I just can't afford my medicine (pt-stated)       Current Barriers:  . Lacks caregiver support.  . Film/video editor.  . Difficulty obtaining medications  Social Work Clinical Goal(s):  Marland Kitchen Over the next 30 days, patient will work with LCSW to address needs related to process for getting established at local [facility]  Interventions: . Confirmed with patient that he has not received Application for Health Benefits through [facility] mailed to his home to sign and return to [facility] for processing . Patient confirmed that he has been having trouble with his mail lately . Confirmed that this social worker will follow up by the end of the week to ensure that he received the mailing . Confirmed that if not received, application may have to be completed again   .  Marland Kitchen Patient Self Care Activities:  . Self administers medications as prescribed . Attends all scheduled provider appointments . Calls pharmacy for medication refills . Attends church or other social activities . Performs ADL's independently . Performs IADL's independently . Calls provider office for new concerns or questions  Please see past updates related to this goal by clicking on the "Past Updates" button in the selected goal          Follow Up Plan: SW  will follow up with patient by phone over the next 2 weeks   [place], [place] Worker  [facility] Care Management [phone]

## 2019-03-11 ENCOUNTER — Ambulatory Visit: Payer: Self-pay | Admitting: *Deleted

## 2019-03-11 DIAGNOSIS — E1121 Type 2 diabetes mellitus with diabetic nephropathy: Secondary | ICD-10-CM

## 2019-03-11 DIAGNOSIS — Z599 Problem related to housing and economic circumstances, unspecified: Secondary | ICD-10-CM

## 2019-03-11 DIAGNOSIS — IMO0002 Reserved for concepts with insufficient information to code with codable children: Secondary | ICD-10-CM

## 2019-03-11 DIAGNOSIS — Z598 Other problems related to housing and economic circumstances: Secondary | ICD-10-CM

## 2019-03-11 NOTE — Chronic Care Management (AMB) (Signed)
  Chronic Care Management    Clinical Social Work Follow Up Note  03/11/2019 Name: JERREN FLINCHBAUGH MRN: [id number] DOB: November 10, 1933  [name] is a 85 y.o. year old male who is a primary care patient of [name], [name], MD. The CCM team was consulted for assistance with applying for benefits through the Department of [facility].  Review of patient status, including review of consultants reports, other relevant assessments, and collaboration with appropriate care team members and the patient's provider was performed as part of comprehensive patient evaluation and provision of chronic care management services.     Goals Addressed            This Visit's Progress   . I just can't afford my medicine (pt-stated)       Current Barriers:  . Lacks caregiver support.  . Film/video editor.  . Difficulty obtaining medications  Social Work Clinical Goal(s):  Marland Kitchen Over the next 30 days, patient will work with LCSW to address needs related to process for getting established at local [facility]  Interventions: . Confirmed again with patient that he has not received Application for Health Benefits through [facility] mailed to his home to sign and return to [facility] for processing . Patient confirmed that he has been having trouble with his mail lately and will look through his mail to make sure it was not overlooked . Confirmed that this social worker will follow up with patient on Monday . Confirmed that if not received, application may have to be completed again on Monday, 03/14/19   .  Marland Kitchen Patient Self Care Activities:  . Self administers medications as prescribed . Attends all scheduled provider appointments . Calls pharmacy for medication refills . Attends church or other social activities . Performs ADL's independently . Performs IADL's independently . Calls provider office for new concerns or questions  Please see past updates related to this goal by clicking on the  "Past Updates" button in the selected goal          Follow Up Plan: SW will follow up with patient by phone over the next week  Patient will double check his mail to make sure the mailing was not overlooked.   Elliot Gurney, [place] Administrator, [facility] Care Management [phone]

## 2019-03-11 NOTE — Patient Instructions (Signed)
Thank you allowing the Chronic Care Management Team to be a part of your care! It was a pleasure speaking with you today!  1. Please go through your mail to ensure you have not overlooked the mailing mailed to your home.   CCM (Chronic Care Management) Team   Trish Fountain RN, BSN Nurse Care Coordinator  [phone]  Ruben Reason PharmD  Clinical Pharmacist  [phone]   Elliot Gurney, LCSW Clinical Social Worker [phone]  Goals Addressed            This Visit's Progress   . I just can't afford my medicine (pt-stated)       Current Barriers:  . Lacks caregiver support.  . Film/video editor.  . Difficulty obtaining medications  Social Work Clinical Goal(s):  Marland Kitchen Over the next 30 days, patient will work with LCSW to address needs related to process for getting established at local [facility]  Interventions: . Confirmed again with patient that he has not received Application for Health Benefits through [facility] mailed to his home to sign and return to [facility] for processing . Patient confirmed that he has been having trouble with his mail lately and will look through his mail to make sure it was not overlooked . Confirmed that this social worker will follow up with patient on Monday . Confirmed that if not received, application may have to be completed again on Monday, 03/14/19   .  Marland Kitchen Patient Self Care Activities:  . Self administers medications as prescribed . Attends all scheduled provider appointments . Calls pharmacy for medication refills . Attends church or other social activities . Performs ADL's independently . Performs IADL's independently . Calls provider office for new concerns or questions  Please see past updates related to this goal by clicking on the "Past Updates" button in the selected goal          The patient verbalized understanding of instructions provided today and declined a print copy of patient instruction materials.    The care management team will reach out to the patient again over the next 7 days.

## 2019-03-14 ENCOUNTER — Encounter: Payer: Self-pay | Admitting: Nurse Practitioner

## 2019-03-14 ENCOUNTER — Ambulatory Visit: Payer: Self-pay | Admitting: *Deleted

## 2019-03-14 ENCOUNTER — Ambulatory Visit (INDEPENDENT_AMBULATORY_CARE_PROVIDER_SITE_OTHER): Payer: Medicare Other | Admitting: Nurse Practitioner

## 2019-03-14 ENCOUNTER — Other Ambulatory Visit: Payer: Self-pay

## 2019-03-14 VITALS — BP 112/60 | HR 95 | Temp 97.9°F | Resp 14 | Ht 70.0 in | Wt 169.9 lb

## 2019-03-14 DIAGNOSIS — IMO0002 Reserved for concepts with insufficient information to code with codable children: Secondary | ICD-10-CM

## 2019-03-14 DIAGNOSIS — I509 Heart failure, unspecified: Secondary | ICD-10-CM | POA: Diagnosis not present

## 2019-03-14 DIAGNOSIS — N2581 Secondary hyperparathyroidism of renal origin: Secondary | ICD-10-CM

## 2019-03-14 DIAGNOSIS — E1121 Type 2 diabetes mellitus with diabetic nephropathy: Secondary | ICD-10-CM

## 2019-03-14 DIAGNOSIS — Z599 Problem related to housing and economic circumstances, unspecified: Secondary | ICD-10-CM

## 2019-03-14 DIAGNOSIS — Z598 Other problems related to housing and economic circumstances: Secondary | ICD-10-CM

## 2019-03-14 DIAGNOSIS — E1165 Type 2 diabetes mellitus with hyperglycemia: Secondary | ICD-10-CM | POA: Diagnosis not present

## 2019-03-14 HISTORY — DX: Heart failure, unspecified: I50.9

## 2019-03-14 NOTE — Chronic Care Management (AMB) (Signed)
  Chronic Care Management    Clinical Social Work Follow Up Note  03/14/2019 Name: Brian [name] MRN: [id number] DOB: 1933-11-15  Brian [name] is a 85 y.o. year old male who is a primary care patient of [name], [name], MD. The CCM team was consulted for assistance with applying for health benefits through the Department of [facility].  Review of patient status, including review of consultants reports, other relevant assessments, and collaboration with appropriate care team members and the patient's provider was performed as part of comprehensive patient evaluation and provision of chronic care management services.     Goals Addressed            This Visit's Progress   . I just can't afford my medicine (pt-stated)       Current Barriers:  . Lacks caregiver support.  . Film/video editor.  . Difficulty obtaining medications  Social Work Clinical Goal(s):  Marland Kitchen Over the next 30 days, patient will work with LCSW to address needs related to process for getting established at local [facility]  Interventions: . Assisted patient with completion of the Application for Health Benefits through [facility] mailed to his home to sign and return to [facility] for processing . Patient confirmed that he did not receive the first application completed by mail . Met with patient briefly to sign the Application for Health Benefits through [facility] which will be mailed to the Department of [employer] today     .  Marland Kitchen Patient Self Care Activities:  . Self administers medications as prescribed . Attends all scheduled provider appointments . Calls pharmacy for medication refills . Attends church or other social activities . Performs ADL's independently . Performs IADL's independently . Calls provider office for new concerns or questions  Please see past updates related to this goal by clicking on the "Past Updates" button in the selected goal          Follow Up Plan:  SW will follow up with patient by phone over the next 30 days regarding the status of the application for health benefits    [place], [place] Worker  [facility] Care Management [phone]

## 2019-03-14 NOTE — Patient Instructions (Signed)
Thank you allowing the Chronic Care Management Team to be a part of your care! It was a pleasure speaking with you today!  1. CCM social worker will follow up with you within 30 days regarding the status of your application for health benefits through the Department of [facility]. 2. Please call this social worker if there are any further questions or concerns regarding the application process for health benefits.  CCM (Chronic Care Management) Team   Trish Fountain RN, BSN Nurse Care Coordinator  [phone]  Ruben Reason PharmD  Clinical Pharmacist  [phone]   Elliot Gurney, LCSW Clinical Social Worker [phone]  Goals Addressed            This Visit's Progress   . I just can't afford my medicine (pt-stated)       Current Barriers:  . Lacks caregiver support.  . Film/video editor.  . Difficulty obtaining medications  Social Work Clinical Goal(s):  Marland Kitchen Over the next 30 days, patient will work with LCSW to address needs related to process for getting established at local [facility]  Interventions: . Assisted patient with completion of the Application for Health Benefits through [facility] mailed to his home to sign and return to [facility] for processing . Patient confirmed that he did not receive the first application completed by mail . Met with patient briefly to sign the Application for Health Benefits through [facility] which will be mailed to the Department of [employer] today     .  Marland Kitchen Patient Self Care Activities:  . Self administers medications as prescribed . Attends all scheduled provider appointments . Calls pharmacy for medication refills . Attends church or other social activities . Performs ADL's independently . Performs IADL's independently . Calls provider office for new concerns or questions  Please see past updates related to this goal by clicking on the "Past Updates" button in the selected goal          The  patient verbalized understanding of instructions provided today and declined a print copy of patient instruction materials.   The care management team will reach out to the patient again over the next 30 days. regarding the status of the application for health benefits through [facility]

## 2019-03-14 NOTE — Patient Instructions (Signed)
-   Please complete the VA benefits form so that we can get your medications covered and switch you to better medications.   - If your blood sugar ever gets under 80- please cut your levemir dose by 1 unit and continue the lowered dose. If your blood sugar every gets under 70- please cut your levemir dose by 2 units and continue lowered dose. If your blood sugar every gets under 60 please let us know immediately.   If you have diabetes, your body cannot make or properly use insulin. This leads to high blood glucose, or blood sugar, levels. Healthy eating helps keep your blood sugar in your target range. It is a critical part of managing your diabetes, because controlling your blood sugar can prevent the complications of diabetes.   Healthy diabetic eating includes  Limiting foods that are high in sugar Eating smaller portions, spread out over the day Being careful about when and how many carbohydrates you eat Eating a variety of whole-grain foods, fruits and vegetables every day Eating less fat Limiting your use of alcohol Using less salt

## 2019-03-14 NOTE — Progress Notes (Signed)
Name: Brian [name]   MRN: [id number]    DOB: [birth year]   Date:03/14/2019       Progress Note  Subjective  Chief Complaint  Chief Complaint  Patient presents with  . Follow-up    HPI  Diabetes Mellitus Patient was on rx actos 66m daily and levemir 18 units. Was sent to endocrinology due to A1C being out of control, he saw them in April and and his actos was cut to 1338mdaily and added on glimepiride 38m39maily, at last visit he stated he lowered his own levemir to 12 units and stopped the glimepiride due to hypoglycemia has had a few readings in the 80's and one at 76 and was feeling shaky and dizzy. States his current regimen is 15 units of levemir daily and 52m79m actos daily. He brings in his blood sugar log today with readings over the last 2 weeks from 77-161, range is typically under 110. He states that he no longer is feeling symptomatic with readings in the 80's.    Plan was to transition him to xultophy and work on getting him on assistance program. Patient is veteran and medication will be covered through [facility] [place] LCSW has been working with him to apply for VA benefits prior to changing medications.  Patient has chronic kidney disease stage 3, is on ACEi for nephroprotection.  Checks blood sugars at home. Patients blood sugar was 153 this morning states he checked it 10 min later and it was 135. Lowest reading in the last month was 76 and highest reading was 153. When his sugar was 76 he felt shaky and dizzy and drank a soda to get it back up. He has had 3 readings under 80 in the last month.Typically fasting sugars are between 100-120. Patient denies personal or family history of thyroid cancers or pancreatitis. Denies polyphagia, polydipsia, polyuria Lab Results  Component Value Date   HGBA1C 8.2 (H) 02/08/2019    Hyperparathyroidism  Followed by nephrologist- Dr. KollJuleen Chinalcium 9.2 on CMP from 11/01/2018  CHF Last ECHO was 12/14/2017- EF was 65-70%. Plan to come  off actos once he has VA benefits. . BP well controlled on quinapril 20mg60mlodtipine 10mg 76my.  No chest pain, lower extremity edema or fatigue.   BP Readings from Last 3 Encounters:  03/14/19 112/60  02/08/19 118/64  11/01/18 120/78      PHQ2/9: Depression screen PHQ 2/9 03/14/2019 02/08/2019 11/01/2018 08/02/2018 05/03/2018  Decreased Interest 0 0 0 0 0  Down, Depressed, Hopeless 0 0 0 0 0  PHQ - 2 Score 0 0 0 0 0  Altered sleeping 0 0 0 0 -  Tired, decreased energy 0 0 0 0 -  Change in appetite 0 0 0 0 -  Feeling bad or failure about yourself  0 0 0 0 -  Trouble concentrating 0 0 0 0 -  Moving slowly or fidgety/restless 0 0 0 0 -  Suicidal thoughts 0 0 0 0 -  PHQ-9 Score 0 0 0 0 -  Difficult doing work/chores Not difficult at all Not difficult at all - - -    PHQ reviewed. Negative  Patient Active Problem List   Diagnosis Date Noted  . Congestive heart failure (HCC) 0[place]0/2020  . Uncontrolled type 2 diabetes mellitus with diabetic nephropathy (HCC) 0[place]1/2019  . Heart murmur 11/04/2017  . Erectile dysfunction 09/10/2017  . Diastolic dysfunction without heart failure 06/08/2017  . Encounter for medication monitoring 05/05/2016  . Aortic stenosis,  moderate 09/21/2015  . Hypertension goal BP (blood pressure) < 140/90 05/08/2015  . Hyperlipidemia LDL goal <100 05/08/2015  . Diverticulosis of colon 05/08/2015  . Secondary hyperparathyroidism of renal origin ([place]) 05/08/2015  . Chronic kidney disease (CKD), stage III (moderate) ([place]) 05/08/2015  . Irritable bowel syndrome with constipation 05/08/2015  . Anemia of chronic renal failure 05/08/2015  . Benign localized prostatic hyperplasia with lower urinary tract symptoms (LUTS) 05/08/2015    Past Medical History:  Diagnosis Date  . Anemia of chronic renal failure 05/08/2015  . Aortic stenosis, mild 09/21/2015  . Arthritis   . Bladder stone   . Chronic kidney disease (CKD), stage III (moderate) ([place]) 05/08/2015   Followed  by Dr. Rolly Salter   . Congestive heart failure ([place]) 03/14/2019  . Diastolic dysfunction without heart failure 06/08/2017  . History of bladder stone   . Hyperlipidemia   . Hypertension   . Nocturia   . Renal disorder   . Type 2 diabetes mellitus ([place])     Past Surgical History:  Procedure Laterality Date  . CYSTOSCOPY/RETROGRADE/URETEROSCOPY/STONE EXTRACTION WITH BASKET N/A 03/22/2013   Procedure: CYSTOSCOPY BLADDER STONE STONE EXTRACTION;  Surgeon: [name], MD;  Location: [place];  Service: Urology;  Laterality: N/A;  CYSTOSCOPY    . TONSILLECTOMY      Social History   Tobacco Use  . Smoking status: Former Smoker    Packs/day: 0.75    Years: 39.00    Pack years: 29.25    Types: Cigarettes    Quit date: 03/21/1993    Years since quitting: 25.9  . Smokeless tobacco: Never Used  . Tobacco comment: smoking cessation materials not required  Substance Use Topics  . Alcohol use: No    Alcohol/week: 0.0 standard drinks     Current Outpatient Medications:  .  amLODipine (NORVASC) 10 MG tablet, Take 1 tablet (10 mg total) by mouth daily. (for high blood pressure), Disp: 90 tablet, Rfl: 3 .  aspirin EC 81 MG tablet, Take 81 mg by mouth daily. Reported on 09/17/2015, Disp: , Rfl:  .  docusate sodium (DOK) 100 MG capsule, Take 1 capsule (100 mg total) by mouth 2 (two) times daily as needed., Disp: 100 capsule, Rfl: 5 .  finasteride (PROSCAR) 5 MG tablet, TAKE ONE (1) TABLET EACH DAY, Disp: 90 tablet, Rfl: 3 .  LEVEMIR FLEXTOUCH 100 UNIT/ML Pen, INJECT 18 UNITS INTO THE SKIN DAILY., Disp: 15 mL, Rfl: 1 .  nystatin-triamcinolone (MYCOLOG II) cream, APPLY TO AFFECTED AREAS 3 TIMES DAILY AS NEEDED, Disp: 60 g, Rfl: 0 .  Omega-3 Fatty Acids (FISH OIL) 1000 MG CAPS, Take 1 capsule (1,000 mg total) by mouth daily., Disp: 90 capsule, Rfl: 3 .  pioglitazone (ACTOS) 30 MG tablet, TAKE 1 TABLET BY MOUTH EVERY DAY, Disp: 30 tablet, Rfl: 2 .  quinapril (ACCUPRIL) 20 MG  tablet, TAKE 1 TABLET BY MOUTH TWICE A DAY, Disp: 60 tablet, Rfl: 8 .  rosuvastatin (CRESTOR) 5 MG tablet, TAKE ONE TAB BY MOUTH FOUR NIGHTS A WEEK, Disp: 52 tablet, Rfl: 1 .  sildenafil (VIAGRA) 100 MG tablet, Take 0.5-1 tablets (50-100 mg total) by mouth daily as needed for erectile dysfunction., Disp: 10 tablet, Rfl: 5 .  tamsulosin (FLOMAX) 0.4 MG CAPS capsule, TAKE 1 CAPSULE BY MOUTH EVERY DAY, Disp: 90 capsule, Rfl: 1 .  ACCU-CHEK GUIDE test strip, USE UP TO 4 TIMES A DAY AS DIRECTED, Disp: 100 strip, Rfl: 0 .  blood glucose meter kit and supplies  KIT, Dispense based on patient and insurance preference. Use up to four times daily as directed. (FOR ICD-9 250.00, 250.01)., Disp: 1 each, Rfl: 0 .  Blood Glucose Monitoring Suppl (ONE TOUCH ULTRA 2) W/DEVICE KIT, , Disp: , Rfl:  .  Insulin Pen Needle (B-D UF III MINI PEN NEEDLES) 31G X 5 MM MISC, USE AS DIRECTED WITH LEVEMIR INSULIN PENS AT BEDTIME, once a day, Disp: 100 each, Rfl: 3  Allergies  Allergen Reactions  . Carvedilol Other (See Comments)    bradycardia  . Niaspan [Niacin Er]   . Shellfish Allergy Other (See Comments)    Pt vomits  . Statins Other (See Comments)    Myalgia  . Sulfa Antibiotics   . Zetia [Ezetimibe]   . Januvia [Sitagliptin] Rash    ROS    No other specific complaints in a complete review of systems (except as listed in HPI above).  Objective  Vitals:   03/14/19 0847  BP: 112/60  Pulse: 95  Resp: 14  Temp: 97.9 F (36.6 C)  TempSrc: Oral  SpO2: 95%  Weight: 169 lb 14.4 oz (77.1 kg)  Height: 5' 10" (1.778 m)     Body mass index is 24.38 kg/m.  Nursing Note and Vital Signs reviewed.  Physical Exam  Constitutional: Patient appears well-developed and well-nourished.No distress.  HEENT: head atraumatic, normocephalic, pupils equal and reactive to light,  neck supple,  Cardiovascular: Normal rate, regular rhythm and normal heart sounds.  No murmur heard. No BLE edema. Pulmonary/Chest: Effort  normal and breath sounds normal. No respiratory distress. Abdominal: Soft.  There is no tenderness. Psychiatric: Patient has a normal mood and affect. behavior is normal. Judgment and thought content normal.    No results found for this or any previous visit (from the past 48 hour(s)).  Assessment & Plan 1. Uncontrolled type 2 diabetes mellitus with diabetic nephropathy ([place]) Given him instructions to titrate levemir down if having hypoglycemia Given VA application to fill out so we can stop actos and levimir and switch to xultophy discussed diet Continue ACEi and statin  2. Congestive heart failure, unspecified HF chronicity, unspecified heart failure type ([place]) Asymoptomatic, continue to monitor; plan to dc actos  3. Secondary hyperparathyroidism of renal origin [facility]) Follows up with nephrology, has appt on Friday per patient.     Face-to-face time with patient was more than 25 minutes, >50% time spent counseling and coordination of care

## 2019-03-18 ENCOUNTER — Other Ambulatory Visit: Payer: Self-pay | Admitting: Family Medicine

## 2019-03-18 DIAGNOSIS — L309 Dermatitis, unspecified: Secondary | ICD-10-CM

## 2019-03-21 ENCOUNTER — Encounter: Payer: Self-pay | Admitting: Family Medicine

## 2019-03-30 ENCOUNTER — Other Ambulatory Visit: Payer: Self-pay | Admitting: Nurse Practitioner

## 2019-04-12 ENCOUNTER — Ambulatory Visit: Payer: Self-pay | Admitting: *Deleted

## 2019-04-12 ENCOUNTER — Ambulatory Visit (INDEPENDENT_AMBULATORY_CARE_PROVIDER_SITE_OTHER): Payer: Medicare Other | Admitting: *Deleted

## 2019-04-12 DIAGNOSIS — IMO0002 Reserved for concepts with insufficient information to code with codable children: Secondary | ICD-10-CM

## 2019-04-12 DIAGNOSIS — E1121 Type 2 diabetes mellitus with diabetic nephropathy: Secondary | ICD-10-CM | POA: Diagnosis not present

## 2019-04-12 DIAGNOSIS — Z599 Problem related to housing and economic circumstances, unspecified: Secondary | ICD-10-CM

## 2019-04-12 DIAGNOSIS — Z598 Other problems related to housing and economic circumstances: Secondary | ICD-10-CM

## 2019-04-12 DIAGNOSIS — E1165 Type 2 diabetes mellitus with hyperglycemia: Secondary | ICD-10-CM

## 2019-04-12 NOTE — Chronic Care Management (AMB) (Signed)
  Chronic Care Management    Clinical Social Work Follow Up Note  04/12/2019 Name: Brian [name] MRN: [id number] DOB: March 18, 1934  Brian [name] is a 85 y.o. year old male who is a primary care patient of [name], [name], MD. The CCM team was consulted for assistance with applying for benefits through [facility].  Review of patient status, including review of consultants reports, other relevant assessments, and collaboration with appropriate care team members and the patient's provider was performed as part of comprehensive patient evaluation and provision of chronic care management services.     Goals Addressed            This Visit's Progress   . I just can't afford my medicine (pt-stated)       Current Barriers:  . Lacks caregiver support.  . Film/video editor.  . Difficulty obtaining medications  Social Work Clinical Goal(s):  Marland Kitchen Over the next 30 days, patient will work with LCSW to address needs related to process for getting established at local [facility]  Interventions: . Followed up with patient in regards to completion of the Application for Health Benefits through [facility]  . Confirmed with patient that he has reviewed the packet of information received and feels that he does not qualify for services . Confirmed with patient plan to visit the [facility] office in person to discuss services and benefits  .  Marland Kitchen Patient Self Care Activities:  . Self administers medications as prescribed . Attends all scheduled provider appointments . Calls pharmacy for medication refills . Attends church or other social activities . Performs ADL's independently . Performs IADL's independently . Calls provider office for new concerns or questions  Please see past updates related to this goal by clicking on the "Past Updates" button in the selected goal          Follow Up Plan: Client will follow up with [facility] in person to discuss benefits and services that  he may qualify for   [employer], [place] [facility] Care Management [phone]

## 2019-04-12 NOTE — Chronic Care Management (AMB) (Signed)
  Chronic Care Management    Clinical Social Work Follow Up Note  04/12/2019 Name: Brian [name] MRN: [id number] DOB: 04-14-1934  Brian [name] is a 85 y.o. year old male who is a primary care patient of [name], [name], MD. The CCM team was consulted for assistance with applying for benefits through [place] of patient status, including review of consultants reports, other relevant assessments, and collaboration with appropriate care team members and the patient's provider was performed as part of comprehensive patient evaluation and provision of chronic care management services.     Goals Addressed            This Visit's Progress   . I just can't afford my medicine (pt-stated)       Current Barriers:  . Lacks caregiver support.  . Film/video editor.  . Difficulty obtaining medications  Social Work Clinical Goal(s):  Marland Kitchen Over the next 30 days, patient will work with LCSW to address needs related to process for getting established at local [facility]  Interventions: . Followed up with patient in regards to completion of the Application for Health Benefits through [facility]  . Confirmed with patient that he received an additional packet of information from [facility] that he has not reviewed yet . Patient encouraged to review the information received and follow through with the application process in order to be considered for benefits through [facility] to assist with his medication needs . Processed patient's feelings regarding reluctance to ask for help and follow through with the application process and offered continued support to do so if needed. . Confirmed that patient was in agreement to review the paperwork received from [facility] and to reach out to this Education officer, museum with any assistance needed to complete the process.   .  . Patient Self Care Activities:  . Self administers medications as prescribed . Attends all scheduled provider  appointments . Calls pharmacy for medication refills . Attends church or other social activities . Performs ADL's independently . Performs IADL's independently . Calls provider office for new concerns or questions  Please see past updates related to this goal by clicking on the "Past Updates" button in the selected goal          Follow Up Plan: Client will review the paperwork received from [facility]  and call this social worker with any assistance needs.    Elliot Gurney, [place] Administrator, [facility] Care Management [phone]

## 2019-04-12 NOTE — Patient Instructions (Addendum)
Thank you allowing the Chronic Care Management Team to be a part of your care! It was a pleasure speaking with you today!  1. Please review the packet received from [facility] and call this Education officer, museum with any assistance needs.  CCM (Chronic Care Management) Team   Trish Fountain RN, BSN Nurse Care Coordinator  [phone]  Ruben Reason PharmD  Clinical Pharmacist  [phone]   Elliot Gurney, LCSW Clinical Social Worker [phone]  Goals Addressed            This Visit's Progress   . I just can't afford my medicine (pt-stated)       Current Barriers:  . Lacks caregiver support.  . Film/video editor.  . Difficulty obtaining medications  Social Work Clinical Goal(s):  Marland Kitchen Over the next 30 days, patient will work with LCSW to address needs related to process for getting established at local [facility]  Interventions: . Followed up with patient in regards to completion of the Application for Health Benefits through [facility]  . Confirmed with patient that he received an additional packet of information from [facility] that he has not reviewed yet . Patient encouraged to review the information received and follow through with the application process in order to be considered for benefits through [facility] to assist with his medication needs . Processed patient's feelings regarding reluctance to ask for help and follow through with the application process and offered continued support to do so if needed. . Confirmed that patient was in agreement to review the paperwork received from [facility] and to reach out to this Education officer, museum with any assistance needed to complete the process.   .  . Patient Self Care Activities:  . Self administers medications as prescribed . Attends all scheduled provider appointments . Calls pharmacy for medication refills . Attends church or other social activities . Performs ADL's independently . Performs IADL's  independently . Calls provider office for new concerns or questions  Please see past updates related to this goal by clicking on the "Past Updates" button in the selected goal          The patient verbalized understanding of instructions provided today and declined a print copy of patient instruction materials.   Telephone follow up appointment with care management team member scheduled for: 04/19/19

## 2019-04-12 NOTE — Patient Instructions (Signed)
Thank you allowing the Chronic Care Management Team to be a part of your care! It was a pleasure speaking with you today!  1. Please continue with plan to visit the [facility] in person to discuss benefits and available resources 2. Please call this social worker with any questions or additional community resource needs in the future  CCM (Chronic Care Management) Team   Trish Fountain RN, BSN Nurse Care Coordinator  [phone]  Ruben Reason PharmD  Clinical Pharmacist  [phone]   Elliot Gurney, [place] Social Worker [phone]  Goals Addressed            This Visit's Progress   . I just can't afford my medicine (pt-stated)       Current Barriers:  . Lacks caregiver support.  . Film/video editor.  . Difficulty obtaining medications  Social Work Clinical Goal(s):  Marland Kitchen Over the next 30 days, patient will work with LCSW to address needs related to process for [facility]  Interventions: . Followed up with patient in regards to completion of the Application for Health Benefits through [facility]  . Confirmed with patient that he has reviewed the packet of information received and feels that he does not qualify for services . Confirmed with patient plan to visit the [facility] office in person to discuss services and benefits  .  Marland Kitchen Patient Self Care Activities:  . Self administers medications as prescribed . Attends all scheduled provider appointments . Calls pharmacy for medication refills . Attends church or other social activities . Performs ADL's independently . Performs IADL's independently . Calls provider office for new concerns or questions  Please see past updates related to this goal by clicking on the "Past Updates" button in the selected goal          The patient verbalized understanding of instructions provided today and declined a print copy of patient instruction materials.   No further  follow up required: patient agrees to call this Education officer, museum with any questions additional community resource needs in the future

## 2019-04-19 ENCOUNTER — Telehealth: Payer: Self-pay

## 2019-04-23 ENCOUNTER — Other Ambulatory Visit: Payer: Self-pay | Admitting: Family Medicine

## 2019-04-27 ENCOUNTER — Telehealth: Payer: Self-pay | Admitting: Family Medicine

## 2019-04-27 NOTE — Telephone Encounter (Signed)
Pt notified to contact  Pharmacy he should have refills

## 2019-04-27 NOTE — Telephone Encounter (Signed)
Medication Refill - Medication:  sildenafil (VIAGRA) 100 MG tablet  Has the patient contacted their pharmacy?  Yes advised to call office.   Preferred Pharmacy (with phone number or street name):  [id number] [address], [place]. [phone] (Phone) [phone] (Fax)   Agent: Please be advised that RX refills may take up to 3 business days. We ask that you follow-up with your pharmacy.

## 2019-05-04 ENCOUNTER — Other Ambulatory Visit: Payer: Self-pay

## 2019-05-04 ENCOUNTER — Ambulatory Visit: Payer: Medicare Other | Admitting: Family Medicine

## 2019-05-04 ENCOUNTER — Encounter: Payer: Self-pay | Admitting: Family Medicine

## 2019-05-04 VITALS — BP 122/74 | HR 81 | Temp 97.9°F | Resp 14 | Ht 70.0 in | Wt 167.6 lb

## 2019-05-04 DIAGNOSIS — E1165 Type 2 diabetes mellitus with hyperglycemia: Secondary | ICD-10-CM

## 2019-05-04 DIAGNOSIS — IMO0002 Reserved for concepts with insufficient information to code with codable children: Secondary | ICD-10-CM

## 2019-05-04 DIAGNOSIS — I10 Essential (primary) hypertension: Secondary | ICD-10-CM | POA: Diagnosis not present

## 2019-05-04 DIAGNOSIS — Z5181 Encounter for therapeutic drug level monitoring: Secondary | ICD-10-CM

## 2019-05-04 DIAGNOSIS — E785 Hyperlipidemia, unspecified: Secondary | ICD-10-CM

## 2019-05-04 DIAGNOSIS — E1121 Type 2 diabetes mellitus with diabetic nephropathy: Secondary | ICD-10-CM

## 2019-05-04 DIAGNOSIS — N183 Chronic kidney disease, stage 3 unspecified: Secondary | ICD-10-CM

## 2019-05-04 MED ORDER — INSULIN PEN NEEDLE 32G X 4 MM MISC: 1 refills | Status: DC

## 2019-05-04 NOTE — Progress Notes (Signed)
Name: Brian [name]   MRN: [id number]    DOB: [birth year]   Date:05/04/2019       Progress Note  Chief Complaint  Patient presents with  . Follow-up  . Diabetes  . Hypertension     Subjective:   Brian [name] is a 85 y.o. male, presents to clinic for routine follow up on the conditions listed above.  Diabetes Mellitus Type II: DM dx 10-20 years Currently managing with Levemir - 12-14 units, actos  Pt has good to fair med compliance, he is titrating his insulin based off of his blood sugars at night and holding his actos based off of morning blood sugars. Pt has no SE from meds. Fasting CBGs typically run around 100, He is checking three times a day, recent high CBG 140 and low CBG 90's.  No hypoglycemic episodes Denies: Polyuria, polydipsia, polyphagia, vision changes, or neuropathy Recent pertinent labs: Lab Results  Component Value Date   HGBA1C 8.2 (H) 02/08/2019   HGBA1C 9.1 (H) 11/01/2018   HGBA1C 8.7 (H) 08/02/2018      Component Value Date/Time   NA 139 11/01/2018 0000   NA 140 11/12/2015 0842   K 4.1 11/01/2018 0000   CL 106 11/01/2018 0000   CO2 24 11/01/2018 0000   GLUCOSE 147 (H) 11/01/2018 0000   BUN 19 11/01/2018 0000   BUN 15 11/12/2015 0842   CREATININE 1.38 (H) 11/01/2018 0000   CALCIUM 9.2 11/01/2018 0000   PROT 6.6 11/01/2018 0000   ALBUMIN 4.0 02/27/2017 0844   AST 18 11/01/2018 0000   ALT 10 11/01/2018 0000   ALKPHOS 50 02/27/2017 0844   BILITOT 0.4 11/01/2018 0000   GFRNONAA 47 (L) 11/01/2018 0000   GFRAA 54 (L) 11/01/2018 0000    Current diet: whatever he wants Current exercise: none  UTD on DM foot exam and eye exam ACEI/ARB: Yes Statin: Yes  Hyperlipidemia:  Current Medication Regimen:  crestor '5mg'$  takes 3x a week. He was taking in the daytime and it made him feel kinda "funny, weak, not normal" Last Lipids: Lab Results  Component Value Date   CHOL 202 (H) 11/01/2018   HDL 77 11/01/2018   LDLCALC 108 (H) 11/01/2018    TRIG 80 11/01/2018   CHOLHDL 2.6 11/01/2018   - Current Diet:  "I'm just eating the right things" - Denies: Chest pain, shortness of breath, myalgias. - Documented aortic atherosclerosis? Yes  Hypertension:  Currently managed on quinapril, and Norvasc Pt reports good med compliance and denies any SE.  No lightheadedness, hypotension, syncope. Blood pressure today is well controlled. BP Readings from Last 3 Encounters:  05/04/19 122/74  03/14/19 112/60  02/08/19 118/64   Pt denies CP, SOB, exertional sx, LE edema, palpitation, Ha's, visual disturbances    Patient Active Problem List   Diagnosis Date Noted  . Congestive heart failure ([place]) 03/14/2019  . Uncontrolled type 2 diabetes mellitus with diabetic nephropathy ([place]) 02/22/2018  . Heart murmur 11/04/2017  . Erectile dysfunction 09/10/2017  . Diastolic dysfunction without heart failure 06/08/2017  . Encounter for medication monitoring 05/05/2016  . Aortic stenosis, moderate 09/21/2015  . Hypertension goal BP (blood pressure) < 140/90 05/08/2015  . Hyperlipidemia LDL goal <100 05/08/2015  . Diverticulosis of colon 05/08/2015  . Secondary hyperparathyroidism of renal origin ([place]) 05/08/2015  . Chronic kidney disease (CKD), stage III (moderate) ([place]) 05/08/2015  . Irritable bowel syndrome with constipation 05/08/2015  . Anemia of chronic renal failure 05/08/2015  . Benign localized prostatic  hyperplasia with lower urinary tract symptoms (LUTS) 05/08/2015    Past Surgical History:  Procedure Laterality Date  . CYSTOSCOPY/RETROGRADE/URETEROSCOPY/STONE EXTRACTION WITH BASKET N/A 03/22/2013   Procedure: CYSTOSCOPY BLADDER STONE STONE EXTRACTION;  Surgeon: [name], MD;  Location: [facility];  Service: Urology;  Laterality: N/A;  CYSTOSCOPY    . TONSILLECTOMY      Family History  Problem Relation Age of Onset  . Congestive Heart Failure Other   . Heart attack Other   . Heart disease Other   .  Diabetes Brother     Social History   Socioeconomic History  . Marital status: Married    Spouse name: [name]  . Number of children: 4  . Years of education: some college  . Highest education level: 12th grade  Occupational History  . Occupation: Retired  Scientific laboratory technician  . Financial resource strain: Somewhat hard  . Food insecurity    Worry: Never true    Inability: Never true  . Transportation needs    Medical: No    Non-medical: No  Tobacco Use  . Smoking status: Former Smoker    Packs/day: 0.75    Years: 39.00    Pack years: 29.25    Types: Cigarettes    Quit date: 03/21/1993    Years since quitting: 26.1  . Smokeless tobacco: Never Used  . Tobacco comment: smoking cessation materials not required  Substance and Sexual Activity  . Alcohol use: No    Alcohol/week: 0.0 standard drinks  . Drug use: No  . Sexual activity: Yes  Lifestyle  . Physical activity    Days per week: 0 days    Minutes per session: 0 min  . Stress: Not at all  Relationships  . Social connections    Talks on phone: More than three times a week    Gets together: More than three times a week    Attends religious service: More than 4 times per year    Active member of club or organization: No    Attends meetings of clubs or organizations: Never    Relationship status: Married  . Intimate partner violence    Fear of current or ex partner: No    Emotionally abused: No    Physically abused: No    Forced sexual activity: No  Other Topics Concern  . Not on file  Social History Narrative   Lives with wife in [place], [place].     Current Outpatient Medications:  .  amLODipine (NORVASC) 10 MG tablet, Take 1 tablet (10 mg total) by mouth daily. (for high blood pressure), Disp: 90 tablet, Rfl: 3 .  aspirin EC 81 MG tablet, Take 81 mg by mouth daily. Reported on 09/17/2015, Disp: , Rfl:  .  docusate sodium (DOK) 100 MG capsule, Take 1 capsule (100 mg total) by mouth 2 (two) times daily as needed.,  Disp: 100 capsule, Rfl: 5 .  finasteride (PROSCAR) 5 MG tablet, TAKE ONE (1) TABLET EACH DAY, Disp: 90 tablet, Rfl: 3 .  LEVEMIR FLEXTOUCH 100 UNIT/ML Pen, INJECT 18 UNITS INTO THE SKIN DAILY. (Patient taking differently: 12-14), Disp: 15 mL, Rfl: 1 .  nystatin-triamcinolone (MYCOLOG II) cream, APPLY TO AFFECTED AREAS 3 TIMES DAILY AS NEEDED, Disp: 60 g, Rfl: 0 .  Omega-3 Fatty Acids (FISH OIL) 1000 MG CAPS, Take 1 capsule (1,000 mg total) by mouth daily., Disp: 90 capsule, Rfl: 3 .  pioglitazone (ACTOS) 30 MG tablet, TAKE 1 TABLET BY MOUTH EVERY DAY, Disp: 30  tablet, Rfl: 2 .  quinapril (ACCUPRIL) 20 MG tablet, TAKE 1 TABLET BY MOUTH TWICE A DAY, Disp: 60 tablet, Rfl: 8 .  rosuvastatin (CRESTOR) 5 MG tablet, TAKE ONE TAB BY MOUTH FOUR NIGHTS A WEEK, Disp: 52 tablet, Rfl: 1 .  tamsulosin (FLOMAX) 0.4 MG CAPS capsule, TAKE 1 CAPSULE BY MOUTH EVERY DAY, Disp: 90 capsule, Rfl: 1 .  ACCU-CHEK GUIDE test strip, USE UP TO 4 TIMES A DAY AS DIRECTED, Disp: 100 strip, Rfl: 0 .  blood glucose meter kit and supplies KIT, Dispense based on patient and insurance preference. Use up to four times daily as directed. (FOR ICD-9 250.00, 250.01)., Disp: 1 each, Rfl: 0 .  Blood Glucose Monitoring Suppl (ONE TOUCH ULTRA 2) W/DEVICE KIT, , Disp: , Rfl:  .  Insulin Pen Needle (B-D UF III MINI PEN NEEDLES) 31G X 5 MM MISC, USE AS DIRECTED WITH LEVEMIR INSULIN PENS AT BEDTIME, once a day, Disp: 100 each, Rfl: 3 .  sildenafil (VIAGRA) 100 MG tablet, Take 0.5-1 tablets (50-100 mg total) by mouth daily as needed for erectile dysfunction. (Patient not taking: Reported on 05/04/2019), Disp: 10 tablet, Rfl: 5  Allergies  Allergen Reactions  . Carvedilol Other (See Comments)    bradycardia  . Niaspan [Niacin Er]   . Shellfish Allergy Other (See Comments)    Pt vomits  . Statins Other (See Comments)    Myalgia  . Sulfa Antibiotics   . Zetia [Ezetimibe]   . Januvia [Sitagliptin] Rash    I personally reviewed active problem  list, medication list, allergies, family history, social history, health maintenance, notes from last encounter, lab results with the patient/caregiver today.  Review of Systems  Constitutional: Negative.   HENT: Negative.   Eyes: Negative.   Respiratory: Negative.   Cardiovascular: Negative.   Gastrointestinal: Negative.   Endocrine: Negative.   Genitourinary: Negative.   Musculoskeletal: Negative.   Skin: Negative.   Allergic/Immunologic: Negative.   Neurological: Negative.   Hematological: Negative.   Psychiatric/Behavioral: Negative.   All other systems reviewed and are negative.    Objective:    Vitals:   05/04/19 0909  BP: 122/74  Pulse: 81  Resp: 14  Temp: 97.9 F (36.6 C)  TempSrc: Oral  SpO2: 99%  Weight: 167 lb 9.6 oz (76 kg)  Height: '5\' 10"'$  (1.778 m)    Body mass index is 24.05 kg/m.  Physical Exam Vitals signs and nursing note reviewed.  Constitutional:      General: He is not in acute distress.    Appearance: Normal appearance. He is well-developed. He is not ill-appearing, toxic-appearing or diaphoretic.  HENT:     Head: Normocephalic and atraumatic.     Jaw: No trismus.     Right Ear: Tympanic membrane, ear canal and external ear normal.     Left Ear: Tympanic membrane, ear canal and external ear normal.     Nose: Nose normal. No mucosal edema.     Right Sinus: No maxillary sinus tenderness or frontal sinus tenderness.     Left Sinus: No maxillary sinus tenderness or frontal sinus tenderness.     Mouth/Throat:     Mouth: Mucous membranes are moist.     Pharynx: Oropharynx is clear. Uvula midline. No oropharyngeal exudate, posterior oropharyngeal erythema or uvula swelling.  Eyes:     General: Lids are normal. No scleral icterus.    Conjunctiva/sclera: Conjunctivae normal.     Pupils: Pupils are equal, round, and reactive to light.  Neck:  Musculoskeletal: Normal range of motion and neck supple.     Trachea: Trachea and phonation normal.  No tracheal deviation.  Cardiovascular:     Rate and Rhythm: Regular rhythm.     Pulses: Normal pulses.          Radial pulses are 2+ on the right side and 2+ on the left side.       Posterior tibial pulses are 2+ on the right side and 2+ on the left side.     Heart sounds: Normal heart sounds. No murmur. No friction rub. No gallop.   Pulmonary:     Effort: Pulmonary effort is normal. No respiratory distress.     Breath sounds: Normal breath sounds. No stridor. No wheezing, rhonchi or rales.  Abdominal:     General: Bowel sounds are normal. There is no distension.     Palpations: Abdomen is soft.     Tenderness: There is no abdominal tenderness. There is no guarding or rebound.  Musculoskeletal: Normal range of motion.     Right lower leg: No edema.     Left lower leg: No edema.  Skin:    General: Skin is warm and dry.     Capillary Refill: Capillary refill takes less than 2 seconds.     Coloration: Skin is not jaundiced or pale.     Findings: No rash.  Neurological:     Mental Status: He is alert and oriented to person, place, and time.     Gait: Gait normal.  Psychiatric:        Mood and Affect: Mood normal.        Speech: Speech normal.        Behavior: Behavior normal.      Recent Results (from the past 2160 hour(s))  HgB A1c     Status: Abnormal   Collection Time: 02/08/19  9:26 AM  Result Value Ref Range   Hgb A1c MFr Bld 8.2 (H) <5.7 % of total Hgb    Comment: For someone without known diabetes, a hemoglobin A1c value of 6.5% or greater indicates that they may have  diabetes and this should be confirmed with a follow-up  test. . For someone with known diabetes, a value <7% indicates  that their diabetes is well controlled and a value  greater than or equal to 7% indicates suboptimal  control. A1c targets should be individualized based on  duration of diabetes, age, comorbid conditions, and  other considerations. . Currently, no consensus exists regarding use of  hemoglobin A1c for diagnosis of diabetes for children. .    Mean Plasma Glucose 189 (calc)   eAG (mmol/L) 10.4 (calc)     PHQ2/9: Depression screen [facility] 2/9 05/04/2019 03/14/2019 02/08/2019 11/01/2018 08/02/2018  Decreased Interest 0 0 0 0 0  Down, Depressed, Hopeless 0 0 0 0 0  PHQ - 2 Score 0 0 0 0 0  Altered sleeping 0 0 0 0 0  Tired, decreased energy 0 0 0 0 0  Change in appetite 0 0 0 0 0  Feeling bad or failure about yourself  0 0 0 0 0  Trouble concentrating 0 0 0 0 0  Moving slowly or fidgety/restless 0 0 0 0 0  Suicidal thoughts 0 0 0 0 0  PHQ-9 Score 0 0 0 0 0  Difficult doing work/chores Not difficult at all Not difficult at all Not difficult at all - -  Some recent data might be hidden    phq 9  is negative Reviewed by me today  Fall Risk: Fall Risk  05/04/2019 03/14/2019 02/08/2019 11/01/2018 08/02/2018  Falls in the past year? 0 0 0 0 0  Number falls in past yr: 0 0 0 - -  Injury with Fall? 0 0 0 - -  Risk for fall due to : - - - - -  Risk for fall due to: Comment - - - - -     Functional Status Survey: Is the patient deaf or have difficulty hearing?: No Does the patient have difficulty seeing, even when wearing glasses/contacts?: No Does the patient have difficulty concentrating, remembering, or making decisions?: No Does the patient have difficulty walking or climbing stairs?: No Does the patient have difficulty dressing or bathing?: No Does the patient have difficulty doing errands alone such as visiting a doctor's office or shopping?: No    Assessment & Plan:   Problem List Items Addressed This Visit      Cardiovascular and Mediastinum   Hypertension goal BP (blood pressure) < 140/90 (Chronic)    Well-controlled, compliant with medications, check labs      Relevant Orders   COMPLETE METABOLIC PANEL WITH GFR (Completed)     Endocrine   Uncontrolled type 2 diabetes mellitus with diabetic nephropathy ([place]) - Primary    Noncompliant with medications he is  titrating long-acting insulin based on postprandial blood sugars I have attempted to educate him on the medications he is on and appropriate titration, he does not seem very interested in changing what he is doing We will put in pharmacy consult to see if we can help with safe use of medicines Recheck CMP and A1c      Relevant Orders   COMPLETE METABOLIC PANEL WITH GFR (Completed)   Hemoglobin A1c (Completed)   Ambulatory referral to Chronic Care Management Services     Genitourinary   Chronic kidney disease (CKD), stage III (moderate) ([facility]) (Chronic)    Patient sees nephrology      Relevant Orders   COMPLETE METABOLIC PANEL WITH GFR (Completed)     Other   Hyperlipidemia LDL goal <100 (Chronic)    Only able to tolerate statin 3 times a week no diet efforts, but 85 y/o Check labs      Relevant Orders   Lipid panel (Completed)   COMPLETE METABOLIC PANEL WITH GFR (Completed)    Other Visit Diagnoses    Medication monitoring encounter       Relevant Orders   CBC with Differential/Platelet (Completed)   Lipid panel (Completed)   COMPLETE METABOLIC PANEL WITH GFR (Completed)   Hemoglobin A1c (Completed)       Return in about 4 months (around 09/03/2019) for Routine follow-up - bring in your meter.   Delsa Grana, PA-C 05/04/19 9:33 AM

## 2019-05-04 NOTE — Patient Instructions (Signed)
Do the same dose of levemir for a week and monitor your fasting morning blood sugar.  Only adjust your dose if the morning sugars are high or low.  If blood sugar is 100-130 leave the dose the same If blood sugar is <80, you can decrease the dose by 2 units and then keep the dose the same for the next week and continue to monitor If blood sugar is >150 in the mornings, then increase levemir dose by 2 units and maintain that dose for the next week and continue to monitor your morning fasting blood sugars.    Insulin Detemir injection What is this medicine? INSULIN DETEMIR (IN su lin DE te mir) is a human-made form of insulin. This drug lowers the amount of sugar in your blood. It is a long-acting insulin that is usually given once or twice a day. This medicine may be used for other purposes; ask your health care provider or pharmacist if you have questions. COMMON BRAND NAME(S): Levemir, Levemir FlexTouch What should I tell my health care provider before I take this medicine? They need to know if you have any of these conditions:  episodes of low blood sugar  eye disease, vision problems  kidney disease  liver disease  an unusual or allergic reaction to insulin, metacresol, other medicines, foods, dyes, or preservatives  pregnant or trying to get pregnant  breast-feeding How should I use this medicine? This medicine is for injection under the skin. Take this medicine at the same time(s) each day. Use exactly as directed. This insulin should never be mixed in the same syringe with other insulins before injection. Do not vigorously shake insulin before use. You will be taught how to adjust doses for activities and illness. Do not use more insulin than prescribed. Do not use more or less often than prescribed. Always check the appearance of your insulin before using it. This medicine should be clear and colorless like water. Do not use if it is cloudy, thickened, colored, or has solid  particles in it. If you use a pen, be sure to take off the outer needle cover before using the dose. It is important that you put your used needles and syringes in a special sharps container. Do not put them in a trash can. If you do not have a sharps container, call your pharmacist or healthcare provider to get one. Talk to your pediatrician regarding the use of this medicine in children. While this drug may be prescribed for children as young as 2 years for selected conditions, precautions do apply. Overdosage: If you think you have taken too much of this medicine contact a poison control center or emergency room at once. NOTE: This medicine is only for you. Do not share this medicine with others. What if I miss a dose? It is important not to miss a dose. Your health care professional or doctor should discuss a plan for missed doses with you. If you do miss a dose, follow their plan. Do not take double doses. What may interact with this medicine?  other medicines for diabetes Many medications may cause changes in blood sugar, these include:  alcohol containing beverages  antiviral medicines for HIV or AIDS  aspirin and aspirin-like drugs  certain medicines for blood pressure, heart disease, irregular heart beat  chromium  diuretics  male hormones, such as estrogens or progestins, birth control pills  fenofibrate  gemfibrozil  isoniazid  lanreotide  male hormones or anabolic steroids  MAOIs like Carbex,  Eldepryl, Marplan, Nardil, and Parnate  medicines for weight loss  medicines for allergies, asthma, cold, or cough  medicines for depression, anxiety, or psychotic disturbances  niacin  nicotine  NSAIDs, medicines for pain and inflammation, like ibuprofen or naproxen  octreotide  pasireotide  pentamidine  phenytoin  probenecid  quinolone antibiotics such as ciprofloxacin, levofloxacin, ofloxacin  some herbal dietary supplements  steroid medicines such  as prednisone or cortisone  sulfamethoxazole; trimethoprim  thyroid hormones Some medications can hide the warning symptoms of low blood sugar (hypoglycemia). You may need to monitor your blood sugar more closely if you are taking one of these medications. These include:  beta-blockers, often used for high blood pressure or heart problems (examples include atenolol, metoprolol, propranolol)  clonidine  guanethidine  reserpine This list may not describe all possible interactions. Give your health care provider a list of all the medicines, herbs, non-prescription drugs, or dietary supplements you use. Also tell them if you smoke, drink alcohol, or use illegal drugs. Some items may interact with your medicine. What should I watch for while using this medicine? Visit your health care professional or doctor for regular checks on your progress. A test called the HbA1C (A1C) will be monitored. This is a simple blood test. It measures your blood sugar control over the last 2 to 3 months. You will receive this test every 3 to 6 months. Learn how to check your blood sugar. Learn the symptoms of low and high blood sugar and how to manage them. Always carry a quick-source of sugar with you in case you have symptoms of low blood sugar. Examples include hard sugar candy or glucose tablets. Make sure others know that you can choke if you eat or drink when you develop serious symptoms of low blood sugar, such as seizures or unconsciousness. They must get medical help at once. Tell your doctor or health care professional if you have high blood sugar. You might need to change the dose of your medicine. If you are sick or exercising more than usual, you might need to change the dose of your medicine. Do not skip meals. Ask your doctor or health care professional if you should avoid alcohol. Many nonprescription cough and cold products contain sugar or alcohol. These can affect blood sugar. Make sure that you have  the right kind of syringe for the type of insulin you use. Try not to change the brand and type of insulin or syringe unless your health care professional or doctor tells you to. Switching insulin brand or type can cause dangerously high or low blood sugar. Always keep an extra supply of insulin, syringes, and needles on hand. Use a syringe one time only. Throw away syringe and needle in a closed container to prevent accidental needle sticks. Insulin pens and cartridges should never be shared. Even if the needle is changed, sharing may result in passing of viruses like hepatitis or HIV. Each time you get a new box of pen needles, check to see if they are the same type as the ones you were trained to use. If not, ask your health care professional to show you how to use this new type properly. Wear a medical ID bracelet or chain, and carry a card that describes your disease and details of your medicine and dosage times. What side effects may I notice from receiving this medicine? Side effects that you should report to your doctor or health care professional as soon as possible:  allergic reactions like  skin rash, itching or hives, swelling of the face, lips, or tongue  breathing problems  signs and symptoms of high blood sugar such as dizziness, dry mouth, dry skin, fruity breath, nausea, stomach pain, increased hunger or thirst, increased urination  signs and symptoms of low blood sugar such as feeling anxious, confusion, dizziness, increased hunger, unusually weak or tired, sweating, shakiness, cold, irritable, headache, blurred vision, fast heartbeat, loss of consciousness Side effects that usually do not require medical attention (report to your doctor or health care professional if they continue or are bothersome):  increase or decrease in fatty tissue under the skin due to overuse of a particular injection site  itching, burning, swelling, or rash at site where injected This list may not  describe all possible side effects. Call your doctor for medical advice about side effects. You may report side effects to FDA at 1-800-FDA-1088. Where should I keep my medicine? Keep out of the reach of children. Unopened Vials: Levemir vials: Store in a refrigerator between 2 and 8 degrees C (36 and 46 degrees F) or at room temperature below 30 degrees C (86 degrees F). Do not freeze or use if the insulin has been frozen. Protect from light and excessive heat. If stored at room temperature, the vial must be discarded after 42 days. Throw away any unopened and unused medicine that has been stored in the refrigerator after the expiration date. Unopened Pens: Levemir Flextouch pens: Store in a refrigerator between 2 and 8 degrees C (36 and 46 degrees F) or at room temperature below 30 degrees C (86 degrees F). Do not freeze or use if the insulin has been frozen. Protect from light and excessive heat. If stored at room temperature, the pen must be discarded after 42 days. Throw away any unopened and unused medicine that has been stored in the refrigerator after the expiration date. Vials that you are using: Levemir vials: Store in the refrigerator or at room temperature below 30 degrees C (86 degrees F). Do not freeze. Keep away from heat and light. Throw the opened vial away after 42 days. Pens that you are using: Levemir Flextouch pens: Store at room temperature, below 30 degrees C (86 degrees F). Do not store in the refrigerator once opened. Do not refrigerate or freeze. Keep away from heat and light. Throw the pen away after 42 days, even if it still has insulin left in it. NOTE: This sheet is a summary. It may not cover all possible information. If you have questions about this medicine, talk to your doctor, pharmacist, or health care provider.  2020 Elsevier/Gold Standard (2018-11-16 11:17:17)

## 2019-05-05 ENCOUNTER — Ambulatory Visit: Payer: Medicare Other

## 2019-05-05 LAB — CBC WITH DIFFERENTIAL/PLATELET
Absolute Monocytes: 377 cells/uL (ref 200–950)
Basophils Absolute: 21 cells/uL (ref 0–200)
Basophils Relative: 0.5 %
Eosinophils Absolute: 176 cells/uL (ref 15–500)
Eosinophils Relative: 4.3 %
HCT: 34.7 % — ABNORMAL LOW (ref 38.5–50.0)
Hemoglobin: 11.3 g/dL — ABNORMAL LOW (ref 13.2–17.1)
Lymphs Abs: 1415 cells/uL (ref 850–3900)
MCH: 29 pg (ref 27.0–33.0)
MCHC: 32.6 g/dL (ref 32.0–36.0)
MCV: 89.2 fL (ref 80.0–100.0)
MPV: 10.7 fL (ref 7.5–12.5)
Monocytes Relative: 9.2 %
Neutro Abs: 2112 cells/uL (ref 1500–7800)
Neutrophils Relative %: 51.5 %
Platelets: 105 10*3/uL — ABNORMAL LOW (ref 140–400)
RBC: 3.89 10*6/uL — ABNORMAL LOW (ref 4.20–5.80)
RDW: 13.2 % (ref 11.0–15.0)
Total Lymphocyte: 34.5 %
WBC: 4.1 10*3/uL (ref 3.8–10.8)

## 2019-05-05 LAB — COMPLETE METABOLIC PANEL WITH GFR
AG Ratio: 1.4 (calc) (ref 1.0–2.5)
ALT: 9 U/L (ref 9–46)
AST: 15 U/L (ref 10–35)
Albumin: 3.8 g/dL (ref 3.6–5.1)
Alkaline phosphatase (APISO): 44 U/L (ref 35–144)
BUN/Creatinine Ratio: 16 (calc) (ref 6–22)
BUN: 21 mg/dL (ref 7–25)
CO2: 24 mmol/L (ref 20–32)
Calcium: 9.4 mg/dL (ref 8.6–10.3)
Chloride: 106 mmol/L (ref 98–110)
Creat: 1.34 mg/dL — ABNORMAL HIGH (ref 0.70–1.11)
GFR, Est African American: 56 mL/min/{1.73_m2} — ABNORMAL LOW (ref >=60)
GFR, Est Non African American: 49 mL/min/{1.73_m2} — ABNORMAL LOW (ref >=60)
Globulin: 2.7 g/dL (calc) (ref 1.9–3.7)
Glucose, Bld: 160 mg/dL — ABNORMAL HIGH (ref 65–99)
Potassium: 4.4 mmol/L (ref 3.5–5.3)
Sodium: 138 mmol/L (ref 135–146)
Total Bilirubin: 0.3 mg/dL (ref 0.2–1.2)
Total Protein: 6.5 g/dL (ref 6.1–8.1)

## 2019-05-05 LAB — HEMOGLOBIN A1C
Hgb A1c MFr Bld: 8.1 % of total Hgb — ABNORMAL HIGH (ref <5.7)
Mean Plasma Glucose: 186 (calc)
eAG (mmol/L): 10.3 (calc)

## 2019-05-05 LAB — LIPID PANEL
Cholesterol: 187 mg/dL (ref <200)
HDL: 68 mg/dL (ref >=40)
LDL Cholesterol (Calc): 106 mg/dL (calc) — ABNORMAL HIGH
Non-HDL Cholesterol (Calc): 119 mg/dL (calc) (ref <130)
Total CHOL/HDL Ratio: 2.8 (calc) (ref <5.0)
Triglycerides: 52 mg/dL (ref <150)

## 2019-05-06 ENCOUNTER — Encounter: Payer: Self-pay | Admitting: Family Medicine

## 2019-05-06 NOTE — Assessment & Plan Note (Signed)
Well-controlled, compliant with medications, check labs

## 2019-05-06 NOTE — Assessment & Plan Note (Signed)
Patient sees nephrology

## 2019-05-06 NOTE — Assessment & Plan Note (Signed)
Noncompliant with medications he is titrating long-acting insulin based on postprandial blood sugars I have attempted to educate him on the medications he is on and appropriate titration, he does not seem very interested in changing what he is doing We will put in pharmacy consult to see if we can help with safe use of medicines Recheck CMP and A1c

## 2019-05-06 NOTE — Assessment & Plan Note (Signed)
Only able to tolerate statin 3 times a week no diet efforts, but 85 y/o Check labs

## 2019-05-07 ENCOUNTER — Other Ambulatory Visit: Payer: Self-pay | Admitting: Family Medicine

## 2019-05-07 DIAGNOSIS — L309 Dermatitis, unspecified: Secondary | ICD-10-CM

## 2019-05-07 NOTE — Telephone Encounter (Signed)
Requested medication (s) are due for refill today: yes  Requested medication (s) are on the active medication list: yes  Last refill:  03/18/2019  Future visit scheduled: yes  Notes to clinic:  pcp and ordering provider are different   Requested Prescriptions  Pending Prescriptions Disp Refills   nystatin-triamcinolone (MYCOLOG II) cream [Pharmacy Med Name: NYSTATIN/TRIAM CREAM] 60 g 0    Sig: APPLY TO AFFECTED AREAS 3 TIMES DAILY AS NEEDED     Off-Protocol Failed - 05/07/2019 11:59 AM      Failed - Medication not assigned to a protocol, review manually.      Passed - Valid encounter within last 12 months    Recent Outpatient Visits          3 days ago Uncontrolled type 2 diabetes mellitus with diabetic nephropathy [facility])   [facility] Delsa Grana, PA-C   1 month ago Uncontrolled type 2 diabetes mellitus with diabetic nephropathy [facility])   [place], NP   2 months ago Hypertension goal BP (blood pressure) < 140/90   [place], NP   6 months ago Uncontrolled type 2 diabetes mellitus with diabetic nephropathy [facility])   [place], [name], MD   8 months ago Uncontrolled type 2 diabetes mellitus with diabetic nephropathy [facility])   [place], [name], MD      Future Appointments            In 3 months Delsa Grana, PA-C [facility], [facility]

## 2019-05-09 NOTE — Telephone Encounter (Signed)
Pt called about status of refill request for nystatin-triamcinolone (MYCOLOG II) cream  / please advise

## 2019-05-11 NOTE — Telephone Encounter (Signed)
dermatitis rash

## 2019-05-11 NOTE — Telephone Encounter (Signed)
Left pt detailed voicemail.

## 2019-05-12 MED ORDER — NYSTATIN-TRIAMCINOLONE 100000-0.1 UNIT/GM-% EX CREA: TOPICAL_CREAM | CUTANEOUS | 0 refills | Status: DC

## 2019-05-12 NOTE — Addendum Note (Signed)
Addended by: Delsa Grana on: 05/12/2019 06:42 PM   Modules accepted: Orders

## 2019-05-27 ENCOUNTER — Other Ambulatory Visit: Payer: Self-pay

## 2019-05-27 MED ORDER — LEVEMIR FLEXTOUCH 100 UNIT/ML ~~LOC~~ SOPN: 10.0000 [IU] | PEN_INJECTOR | Freq: Every day | SUBCUTANEOUS | 1 refills | Status: DC

## 2019-06-03 ENCOUNTER — Ambulatory Visit: Payer: Self-pay | Admitting: Pharmacist

## 2019-06-03 DIAGNOSIS — E1121 Type 2 diabetes mellitus with diabetic nephropathy: Secondary | ICD-10-CM

## 2019-06-03 DIAGNOSIS — N183 Chronic kidney disease, stage 3 unspecified: Secondary | ICD-10-CM

## 2019-06-03 DIAGNOSIS — IMO0002 Reserved for concepts with insufficient information to code with codable children: Secondary | ICD-10-CM

## 2019-06-03 NOTE — Patient Instructions (Signed)
Goals Addressed            This Visit's Progress   . "I would like to get better control of my diabetes" (pt-stated)       Current Barriers:  . Non Adherence to prescribed medication regimen and Financial Barriers  Pharmacist Clinical Goal(s):  Marland Kitchen Over the next 30 days, patient will demonstrate improved understanding of prescribed medications and rationale for usage as evidenced by patient report . Over the next 30 days, patient will work with clinical pharmacist to apply to [employer] medication assistance program, facilitated by [employer]   Interventions: . Provided patient materials to apply for Levemir assistance . Reviewed FBGs with patient  Patient Self Care Activities:  . Self administers medications as prescribed . Checks BG as indicated  Initial goal documentation

## 2019-06-03 NOTE — Chronic Care Management (AMB) (Signed)
  Chronic Care Management   Note  06/03/2019 Name: Brian [name] MRN: [id number] DOB: 1933/12/04  Successful outreach and re-engagement of patient with CCM clinical pharmacist based on recent referral from provider Delsa Grana, Pa-C for medication management of T2DM.   HIPAA identifiers verified.   Goals Addressed            This Visit's Progress   . "I would like to get better control of my diabetes" (pt-stated)       Current Barriers:  . Non Adherence to prescribed medication regimen and Financial Barriers  Pharmacist Clinical Goal(s):  Marland Kitchen Over the next 30 days, patient will demonstrate improved understanding of prescribed medications and rationale for usage as evidenced by patient report . Over the next 30 days, patient will work with clinical pharmacist to apply to [employer] medication assistance program, facilitated by [employer]   Interventions: . Provided patient materials to apply for Levemir assistance . Reviewed FBGs with patient  Patient Self Care Activities:  . Self administers medications as prescribed . Checks BG as indicated  Initial goal documentation          Follow up plan: Telephone follow up appointment with care management team member scheduled for: 2 weeks with PharmD   Ruben Reason, PharmD Clinical Pharmacist [facility] [phone]

## 2019-06-08 ENCOUNTER — Other Ambulatory Visit: Payer: Self-pay

## 2019-06-08 MED ORDER — GLUCOSE BLOOD VI STRP: ORAL_STRIP | 12 refills | Status: DC

## 2019-06-13 ENCOUNTER — Telehealth: Payer: Self-pay | Admitting: Family Medicine

## 2019-06-13 NOTE — Telephone Encounter (Signed)
Pt was prescribed pioglitazone hcl 30mg . He would like something different called into his pharmacy b/c he does not like the side effects. Please send new script to [facility] [place]. Or if you have questions give him a call at [phone] or [phone]. pt in the waiting room and will not leave he is going to wait on you. I informed him that you may not be able to get to it today but he insisited and said he need it taken care of

## 2019-06-13 NOTE — Telephone Encounter (Signed)
I spoke to patient and he finally left but I think its causing him ED.  Can you please send something else in today?

## 2019-06-14 NOTE — Telephone Encounter (Signed)
Pt called to check of status of a new medication. Pt states that the other medication is not working and making him feel really bad. It makes him feel dizzy and he has heart problems / Pt asked to go back to taking glimepiride (AMARYL) 2 MG tablet  If the Pioglitazone is going to make him feel bad.  / please advise

## 2019-06-15 ENCOUNTER — Telehealth: Payer: Self-pay

## 2019-06-15 ENCOUNTER — Ambulatory Visit: Payer: Self-pay | Admitting: Pharmacist

## 2019-06-15 DIAGNOSIS — E1121 Type 2 diabetes mellitus with diabetic nephropathy: Secondary | ICD-10-CM

## 2019-06-15 DIAGNOSIS — IMO0002 Reserved for concepts with insufficient information to code with codable children: Secondary | ICD-10-CM

## 2019-06-15 NOTE — Telephone Encounter (Signed)
App scheduled.

## 2019-06-15 NOTE — Chronic Care Management (AMB) (Signed)
  Chronic Care Management   Note  06/15/2019 Name: Brian [name] MRN: [id number] DOB: 1934/02/22  86 y.o. year old male referred to Chronic Care Management clinical pharmacy service. Follow up outreach today for Levemir assistnace program. Also note that patient has been experiencing side effects from pioglitazone, and other DM medications. Would prefer to schedule full pharmacy appointment. .   Was unable to reach patient via telephone today and have left HIPAA compliant voicemail asking patient to return my call. (unsuccessful outreach #1).  Follow up plan: A HIPPA compliant phone message was left for the patient providing contact information and requesting a return call.  The care management team will reach out to the patient again over the next 5-7 days.   Ruben Reason, PharmD Clinical Pharmacist [facility] [phone]

## 2019-06-21 ENCOUNTER — Ambulatory Visit: Payer: Medicare Other | Admitting: Family Medicine

## 2019-06-21 ENCOUNTER — Ambulatory Visit: Payer: Self-pay | Admitting: Pharmacist

## 2019-06-21 ENCOUNTER — Encounter: Payer: Self-pay | Admitting: Family Medicine

## 2019-06-21 ENCOUNTER — Telehealth: Payer: Self-pay

## 2019-06-21 ENCOUNTER — Other Ambulatory Visit: Payer: Self-pay

## 2019-06-21 VITALS — BP 140/60 | HR 65 | Temp 96.9°F | Resp 14 | Ht 70.0 in | Wt 168.7 lb

## 2019-06-21 DIAGNOSIS — E1165 Type 2 diabetes mellitus with hyperglycemia: Secondary | ICD-10-CM | POA: Diagnosis not present

## 2019-06-21 DIAGNOSIS — E1121 Type 2 diabetes mellitus with diabetic nephropathy: Secondary | ICD-10-CM

## 2019-06-21 DIAGNOSIS — IMO0002 Reserved for concepts with insufficient information to code with codable children: Secondary | ICD-10-CM

## 2019-06-21 NOTE — Progress Notes (Signed)
Name: Brian [name]   MRN: [id number]    DOB: November 18, 1933   Date:06/21/2019       Progress Note  Chief Complaint  Patient presents with  . Medication Management    wants to change actos due to side effects     Subjective:   Brian [name] is a 85 y.o. male, presents to clinic for routine follow up on the conditions listed above.  Diabetes Mellitus Type II: DM dx 15+ years ago Currently managing with glimepiride and pioglitazone  - cannot take metformin due to renal function - sees nephrology, but pt has come today to discuss meds and concerns of SE - he had the prescription pamphlets with him and all his meds.   levemir every night 12 units - 15 units, he changes the dose based on postprandial blood sugar readings Pt unable to say specific SE of actos - just that he had a bad reaction to it.  Last visit was about a month ago when I first med Mr. [name], at that time he asked for refills of actos and was reportedly "holding" the actos based off of morning blood sugar readings. Pt did not bring his meter in today, and is having difficulty recalling recent CBGs.  He was referred to our CCM pharmacist to work with medication adherence and DM education, he had his first appt on 06/03/2019 and then missed his second appt on 06/21/2019.  Last week he was in the lobby for several hours w/o a scheduled appt, wanting to discuss med changes and SE.    Pt unable to take metformin due to renal function.  Cannot take Januvia due to rash allergy.  Not tolerating Actos. Recent pertinent labs: Lab Results  Component Value Date   HGBA1C 8.1 (H) 05/04/2019   HGBA1C 8.2 (H) 02/08/2019   HGBA1C 9.1 (H) 11/01/2018      Component Value Date/Time   NA 138 05/04/2019 0000   NA 140 11/12/2015 0842   K 4.4 05/04/2019 0000   CL 106 05/04/2019 0000   CO2 24 05/04/2019 0000   GLUCOSE 160 (H) 05/04/2019 0000   BUN 21 05/04/2019 0000   BUN 15 11/12/2015 0842   CREATININE 1.34 (H) 05/04/2019 0000    CALCIUM 9.4 05/04/2019 0000   PROT 6.5 05/04/2019 0000   ALBUMIN 4.0 02/27/2017 0844   AST 15 05/04/2019 0000   ALT 9 05/04/2019 0000   ALKPHOS 50 02/27/2017 0844   BILITOT 0.3 05/04/2019 0000   GFRNONAA 49 (L) 05/04/2019 0000   GFRAA 56 (L) 05/04/2019 0000      Patient Active Problem List   Diagnosis Date Noted  . Congestive heart failure ([place]) 03/14/2019  . Uncontrolled type 2 diabetes mellitus with diabetic nephropathy ([place]) 02/22/2018  . Heart murmur 11/04/2017  . Erectile dysfunction 09/10/2017  . Diastolic dysfunction without heart failure 06/08/2017  . Aortic stenosis, moderate 09/21/2015  . Hypertension goal BP (blood pressure) < 140/90 05/08/2015  . Hyperlipidemia LDL goal <100 05/08/2015  . Diverticulosis of colon 05/08/2015  . Secondary hyperparathyroidism of renal origin ([place]) 05/08/2015  . Chronic kidney disease (CKD), stage III (moderate) 05/08/2015  . Irritable bowel syndrome with constipation 05/08/2015  . Benign localized prostatic hyperplasia with lower urinary tract symptoms (LUTS) 05/08/2015    Past Surgical History:  Procedure Laterality Date  . CYSTOSCOPY/RETROGRADE/URETEROSCOPY/STONE EXTRACTION WITH BASKET N/A 03/22/2013   Procedure: CYSTOSCOPY BLADDER STONE STONE EXTRACTION;  Surgeon: [name], MD;  Location: [facility];  Service: Urology;  Laterality: N/A;  CYSTOSCOPY    . TONSILLECTOMY      Family History  Problem Relation Age of Onset  . Congestive Heart Failure Other   . Heart attack Other   . Heart disease Other   . Diabetes Brother     Social History   Socioeconomic History  . Marital status: Married    Spouse name: [name]  . Number of children: 4  . Years of education: some college  . Highest education level: 12th grade  Occupational History  . Occupation: Retired  Scientific laboratory technician  . Financial resource strain: Somewhat hard  . Food insecurity    Worry: Never true    Inability: Never true  .  Transportation needs    Medical: No    Non-medical: No  Tobacco Use  . Smoking status: Former Smoker    Packs/day: 0.75    Years: 39.00    Pack years: 29.25    Types: Cigarettes    Quit date: 03/21/1993    Years since quitting: 26.2  . Smokeless tobacco: Never Used  . Tobacco comment: smoking cessation materials not required  Substance and Sexual Activity  . Alcohol use: No    Alcohol/week: 0.0 standard drinks  . Drug use: No  . Sexual activity: Yes  Lifestyle  . Physical activity    Days per week: 0 days    Minutes per session: 0 min  . Stress: Not at all  Relationships  . Social connections    Talks on phone: More than three times a week    Gets together: More than three times a week    Attends religious service: More than 4 times per year    Active member of club or organization: No    Attends meetings of clubs or organizations: Never    Relationship status: Married  . Intimate partner violence    Fear of current or ex partner: No    Emotionally abused: No    Physically abused: No    Forced sexual activity: No  Other Topics Concern  . Not on file  Social History Narrative   Lives with wife in [place], [place].     Current Outpatient Medications:  .  ACCU-CHEK GUIDE test strip, USE UP TO 4 TIMES A DAY AS DIRECTED, Disp: 100 strip, Rfl: 0 .  amLODipine (NORVASC) 10 MG tablet, Take 1 tablet (10 mg total) by mouth daily. (for high blood pressure), Disp: 90 tablet, Rfl: 3 .  aspirin EC 81 MG tablet, Take 81 mg by mouth daily. Reported on 09/17/2015, Disp: , Rfl:  .  blood glucose meter kit and supplies KIT, Dispense based on patient and insurance preference. Use up to four times daily as directed. (FOR ICD-9 250.00, 250.01)., Disp: 1 each, Rfl: 0 .  Blood Glucose Monitoring Suppl (ONE TOUCH ULTRA 2) W/DEVICE KIT, , Disp: , Rfl:  .  docusate sodium (DOK) 100 MG capsule, Take 1 capsule (100 mg total) by mouth 2 (two) times daily as needed., Disp: 100 capsule, Rfl: 5 .   finasteride (PROSCAR) 5 MG tablet, TAKE ONE (1) TABLET EACH DAY, Disp: 90 tablet, Rfl: 3 .  glucose blood test strip, Use as instructed, Disp: 100 each, Rfl: 12 .  Insulin Detemir (LEVEMIR FLEXTOUCH) 100 UNIT/ML Pen, Inject 10-20 Units into the skin daily., Disp: 15 mL, Rfl: 1 .  Insulin Pen Needle (B-D UF III MINI PEN NEEDLES) 31G X 5 MM MISC, USE AS DIRECTED WITH LEVEMIR INSULIN PENS AT BEDTIME, once a  day, Disp: 100 each, Rfl: 3 .  Insulin Pen Needle 32G X 4 MM MISC, Use as directed with insulin daily for IDDM, Disp: 100 each, Rfl: 1 .  nystatin-triamcinolone (MYCOLOG II) cream, APPLY TO AFFECTED AREAS BID PRN for rash or itching., Disp: 60 g, Rfl: 0 .  Omega-3 Fatty Acids (FISH OIL) 1000 MG CAPS, Take 1 capsule (1,000 mg total) by mouth daily., Disp: 90 capsule, Rfl: 3 .  quinapril (ACCUPRIL) 20 MG tablet, TAKE 1 TABLET BY MOUTH TWICE A DAY, Disp: 60 tablet, Rfl: 8 .  rosuvastatin (CRESTOR) 5 MG tablet, TAKE ONE TAB BY MOUTH FOUR NIGHTS A WEEK, Disp: 52 tablet, Rfl: 1 .  tamsulosin (FLOMAX) 0.4 MG CAPS capsule, TAKE 1 CAPSULE BY MOUTH EVERY DAY, Disp: 90 capsule, Rfl: 1 .  pioglitazone (ACTOS) 30 MG tablet, TAKE 1 TABLET BY MOUTH EVERY DAY (Patient not taking: Reported on 06/21/2019), Disp: 30 tablet, Rfl: 2 .  sildenafil (VIAGRA) 100 MG tablet, Take 0.5-1 tablets (50-100 mg total) by mouth daily as needed for erectile dysfunction. (Patient not taking: Reported on 05/04/2019), Disp: 10 tablet, Rfl: 5  Allergies  Allergen Reactions  . Carvedilol Other (See Comments)    bradycardia  . Niaspan [Niacin Er]   . Shellfish Allergy Other (See Comments)    Pt vomits  . Statins Other (See Comments)    Myalgia  . Sulfa Antibiotics   . Zetia [Ezetimibe]   . Januvia [Sitagliptin] Rash    I personally reviewed active problem list, medication list, allergies, notes from last several encounters, lab results with the patient/caregiver today.  Review of Systems  Constitutional: Negative.   HENT:  Negative.   Eyes: Negative.   Respiratory: Negative.   Cardiovascular: Negative.   Gastrointestinal: Negative.   Endocrine: Negative.   Genitourinary: Negative.   Musculoskeletal: Negative.   Skin: Negative.   Allergic/Immunologic: Negative.   Neurological: Negative.   Hematological: Negative.   Psychiatric/Behavioral: Negative.   All other systems reviewed and are negative.    Objective:    Vitals:   06/21/19 1110  Pulse: 65  Resp: 14  Temp: (!) 96.9 F (36.1 C)  SpO2: 99%  Weight: 168 lb 11.2 oz (76.5 kg)  Height: '5\' 10"'$  (1.778 m)    Body mass index is 24.21 kg/m.  Physical Exam Vitals signs and nursing note reviewed.  Constitutional:      General: He is not in acute distress.    Appearance: He is well-developed. He is not ill-appearing, toxic-appearing or diaphoretic.     Comments: Elderly male, alert  HENT:     Head: Normocephalic and atraumatic.     Nose: Nose normal.  Eyes:     General:        Right eye: No discharge.        Left eye: No discharge.     Conjunctiva/sclera: Conjunctivae normal.  Neck:     Trachea: No tracheal deviation.  Cardiovascular:     Rate and Rhythm: Normal rate and regular rhythm.  Pulmonary:     Effort: Pulmonary effort is normal. No respiratory distress.     Breath sounds: No stridor.  Musculoskeletal: Normal range of motion.  Skin:    General: Skin is warm and dry.     Findings: No rash.  Neurological:     Mental Status: He is alert.     Motor: No abnormal muscle tone.     Coordination: Coordination normal.  Psychiatric:     Comments: Pt slightly confused, inconsistent answers  Recent Results (from the past 2160 hour(s))  CBC with Differential/Platelet     Status: Abnormal   Collection Time: 05/04/19 12:00 AM  Result Value Ref Range   WBC 4.1 3.8 - 10.8 Thousand/uL   RBC 3.89 (L) 4.20 - 5.80 Million/uL   Hemoglobin 11.3 (L) 13.2 - 17.1 g/dL   HCT 34.7 (L) 38.5 - 50.0 %   MCV 89.2 80.0 - 100.0 fL   MCH 29.0  27.0 - 33.0 pg   MCHC 32.6 32.0 - 36.0 g/dL   RDW 13.2 11.0 - 15.0 %   Platelets 105 (L) 140 - 400 Thousand/uL   MPV 10.7 7.5 - 12.5 fL   Neutro Abs 2,112 1,500 - 7,800 cells/uL   Lymphs Abs 1,415 850 - 3,900 cells/uL   Absolute Monocytes 377 200 - 950 cells/uL   Eosinophils Absolute 176 15 - 500 cells/uL   Basophils Absolute 21 0 - 200 cells/uL   Neutrophils Relative % 51.5 %   Total Lymphocyte 34.5 %   Monocytes Relative 9.2 %   Eosinophils Relative 4.3 %   Basophils Relative 0.5 %  Lipid panel     Status: Abnormal   Collection Time: 05/04/19 12:00 AM  Result Value Ref Range   Cholesterol 187 <200 mg/dL   HDL 68 > OR = 40 mg/dL   Triglycerides 52 <150 mg/dL   LDL Cholesterol (Calc) 106 (H) mg/dL (calc)    Comment: Reference range: <100 . Desirable range <100 mg/dL for primary prevention;   <70 mg/dL for patients with CHD or diabetic patients  with > or = 2 CHD risk factors. Marland Kitchen LDL-C is now calculated using the Martin-Hopkins  calculation, which is a validated novel method providing  better accuracy than the Friedewald equation in the  estimation of LDL-C.  Cresenciano Genre et al. Annamaria Helling. 0973;532(99): 2061-2068  (http://education.[facility].com/faq/FAQ164)    Total CHOL/HDL Ratio 2.8 <5.0 (calc)   Non-HDL Cholesterol (Calc) 119 <130 mg/dL (calc)    Comment: For patients with diabetes plus 1 major ASCVD risk  factor, treating to a non-HDL-C goal of <100 mg/dL  (LDL-C of <70 mg/dL) is considered a therapeutic  option.   COMPLETE METABOLIC PANEL WITH GFR     Status: Abnormal   Collection Time: 05/04/19 12:00 AM  Result Value Ref Range   Glucose, Bld 160 (H) 65 - 99 mg/dL    Comment: .            Fasting reference interval . For someone without known diabetes, a glucose value >125 mg/dL indicates that they may have diabetes and this should be confirmed with a follow-up test. .    BUN 21 7 - 25 mg/dL   Creat 1.34 (H) 0.70 - 1.11 mg/dL    Comment: For patients >43  years of age, the reference limit for Creatinine is approximately 13% higher for people identified as African-American. .    GFR, Est Non African American 49 (L) > OR = 60 mL/min/1.47m   GFR, Est African American 56 (L) > OR = 60 mL/min/1.721m  BUN/Creatinine Ratio 16 6 - 22 (calc)   Sodium 138 135 - 146 mmol/L   Potassium 4.4 3.5 - 5.3 mmol/L   Chloride 106 98 - 110 mmol/L   CO2 24 20 - 32 mmol/L   Calcium 9.4 8.6 - 10.3 mg/dL   Total Protein 6.5 6.1 - 8.1 g/dL   Albumin 3.8 3.6 - 5.1 g/dL   Globulin 2.7 1.9 - 3.7 g/dL (calc)   AG Ratio  1.4 1.0 - 2.5 (calc)   Total Bilirubin 0.3 0.2 - 1.2 mg/dL   Alkaline phosphatase (APISO) 44 35 - 144 U/L   AST 15 10 - 35 U/L   ALT 9 9 - 46 U/L  Hemoglobin A1c     Status: Abnormal   Collection Time: 05/04/19 12:00 AM  Result Value Ref Range   Hgb A1c MFr Bld 8.1 (H) <5.7 % of total Hgb    Comment: For someone without known diabetes, a hemoglobin A1c value of 6.5% or greater indicates that they may have  diabetes and this should be confirmed with a follow-up  test. . For someone with known diabetes, a value <7% indicates  that their diabetes is well controlled and a value  greater than or equal to 7% indicates suboptimal  control. A1c targets should be individualized based on  duration of diabetes, age, comorbid conditions, and  other considerations. . Currently, no consensus exists regarding use of hemoglobin A1c for diagnosis of diabetes for children. .    Mean Plasma Glucose 186 (calc)   eAG (mmol/L) 10.3 (calc)    PHQ2/9: Depression screen [facility] 2/9 06/21/2019 05/04/2019 03/14/2019 02/08/2019 11/01/2018  Decreased Interest 0 0 0 0 0  Down, Depressed, Hopeless 0 0 0 0 0  PHQ - 2 Score 0 0 0 0 0  Altered sleeping 0 0 0 0 0  Tired, decreased energy 0 0 0 0 0  Change in appetite 0 0 0 0 0  Feeling bad or failure about yourself  0 0 0 0 0  Trouble concentrating 0 0 0 0 0  Moving slowly or fidgety/restless 0 0 0 0 0  Suicidal  thoughts 0 0 0 0 0  PHQ-9 Score 0 0 0 0 0  Difficult doing work/chores Not difficult at all Not difficult at all Not difficult at all Not difficult at all -  Some recent data might be hidden    phq 9 is negative- reviewed  Fall Risk: Fall Risk  06/21/2019 05/04/2019 03/14/2019 02/08/2019 11/01/2018  Falls in the past year? 0 0 0 0 0  Number falls in past yr: 0 0 0 0 -  Injury with Fall? 0 0 0 0 -  Risk for fall due to : - - - - -  Risk for fall due to: Comment - - - - -      Functional Status Survey: Is the patient deaf or have difficulty hearing?: No Does the patient have difficulty seeing, even when wearing glasses/contacts?: No Does the patient have difficulty concentrating, remembering, or making decisions?: No Does the patient have difficulty walking or climbing stairs?: No Does the patient have difficulty dressing or bathing?: No Does the patient have difficulty doing errands alone such as visiting a doctor's office or shopping?: No    Assessment & Plan:   1. Uncontrolled type 2 diabetes mellitus with diabetic nephropathy (HCC) Pt does not have his meter - plan would be to titrate levemir to fasting blood sugars.  Pt would not leave today, but staff helped call his wife, and they explained follow up plan - to monitor fasting CBGs for a week or so, continue to give same levemir dose for now - 12 units.  Call or drop off blood sugar info so we can help him titrate meds.   D/C actos due to his reports of SE Have requested he bring his wife with him to his next appointment.  - Ambulatory referral to Endocrinology    Return  in about 1 month (around 07/22/2019) for f/up for OV for DM check - must bring meter (see if we can coordinate with Almyra Free) .   Delsa Grana, PA-C 06/21/19 11:31 AM

## 2019-06-21 NOTE — Patient Instructions (Addendum)
Stop the pioglitazone  Return in one month (or in a few weeks) with your blood sugar meter  Check every day your FASTING blood sugar - once a day before you eat breakfast. We will need to adjust your levemir dose based on your fasting blood sugar levels ONLY  Do not adjust levemir daily based off your nighttime or other readings from during the day

## 2019-06-27 ENCOUNTER — Other Ambulatory Visit: Payer: Self-pay

## 2019-06-27 NOTE — Chronic Care Management (AMB) (Signed)
  Chronic Care Management   Note  06/27/2019-late entry Name: Brian [name] MRN: [id number] DOB: [birth year]  85 y.o. year old male referred to Chronic Care Management clinical pharmacy services. Outreach to follow up on Levemir assistance. Based on chart review, it is this clinician's opinion that patient would benefit from face to face pharmacy visit.  Was unable to reach patient via telephone today and have left HIPAA compliant voicemail asking patient to return my call. (unsuccessful outreach #2).  Follow up plan: A HIPPA compliant phone message was left for the patient providing contact information and requesting a return call.  The care management team will reach out to the patient again over the next 5-7 days.   Ruben Reason, PharmD Clinical Pharmacist [facility] [phone]

## 2019-06-28 ENCOUNTER — Other Ambulatory Visit: Payer: Self-pay

## 2019-06-30 MED ORDER — FINASTERIDE 5 MG PO TABS: 5.0000 mg | ORAL_TABLET | Freq: Every day | ORAL | 3 refills | Status: AC

## 2019-07-28 ENCOUNTER — Other Ambulatory Visit: Payer: Self-pay

## 2019-07-28 DIAGNOSIS — L309 Dermatitis, unspecified: Secondary | ICD-10-CM

## 2019-07-29 MED ORDER — NYSTATIN-TRIAMCINOLONE 100000-0.1 UNIT/GM-% EX CREA: TOPICAL_CREAM | CUTANEOUS | 0 refills | Status: DC

## 2019-08-01 ENCOUNTER — Other Ambulatory Visit: Payer: Self-pay | Admitting: Family Medicine

## 2019-08-01 DIAGNOSIS — I10 Essential (primary) hypertension: Secondary | ICD-10-CM

## 2019-08-12 ENCOUNTER — Other Ambulatory Visit: Payer: Self-pay | Admitting: Family Medicine

## 2019-08-12 MED ORDER — SILDENAFIL CITRATE 20 MG PO TABS: 20.0000 mg | ORAL_TABLET | ORAL | 3 refills | Status: DC | PRN

## 2019-08-14 LAB — HM DIABETES EYE EXAM

## 2019-08-16 ENCOUNTER — Ambulatory Visit: Payer: Self-pay

## 2019-08-16 NOTE — Telephone Encounter (Signed)
Per wife, found pt crying due to left shoulder pain. Pt stated that his shoulder has been hurting and c/o intermittent numbness and tingling to the left fingers. Denied chest, neck, jaw or back pain. Per wife, his shoulder pain has been preventing pt from sleeping at night. Discussed further with pt who stated his pain is not severe and he stated he thinks the shoulder pain is from neuropathy. Pt denied SOB, sweating and dizziness. Advised pt to [facility] or ED for evaluation. Pt stated he will just call back for an appointment. Put pt on hold and informed Arbie Cookey at office and transferred call to her for appt.  Reason for Disposition . [1] MODERATE pain (e.g., interferes with normal activities) AND [2] present > 3 days  Answer Assessment - Initial Assessment Questions 1. ONSET: "When did the pain start?"     All the time 2. LOCATION: "Where is the pain located?"     Left shoulder 3. PAIN: "How bad is the pain?" (Scale 1-10; or mild, moderate, severe)   - MILD (1-3): doesn't interfere with normal activities   - MODERATE (4-7): interferes with normal activities (e.g., work or school) or awakens from sleep   - SEVERE (8-10): excruciating pain, unable to do any normal activities, unable to move arm at all due to pain     Moderate  4. WORK OR EXERCISE: "Has there been any recent work or exercise that involved this part of the body?"     no 5. CAUSE: "What do you think is causing the shoulder pain?"     Diabetic neuropathy 6. OTHER SYMPTOMS: "Do you have any other symptoms?" (e.g., neck pain, swelling, rash, fever, numbness, weakness)     Tingling/numbness  To fingers  7. PREGNANCY: "Is there any chance you are pregnant?" "When was your last menstrual period?"     n/a  Protocols used: SHOULDER PAIN-A-AH

## 2019-08-17 ENCOUNTER — Ambulatory Visit
Admission: RE | Admit: 2019-08-17 | Discharge: 2019-08-17 | Disposition: A | Discharge status: Home / Self Care | Payer: Medicare Other | Source: Ambulatory Visit | Attending: Family Medicine | Admitting: Family Medicine

## 2019-08-17 ENCOUNTER — Ambulatory Visit
Admission: RE | Admit: 2019-08-17 | Discharge: 2019-08-17 | Disposition: A | Discharge status: Home / Self Care | Payer: Medicare Other | Attending: Family Medicine | Admitting: Family Medicine

## 2019-08-17 ENCOUNTER — Encounter: Payer: Self-pay | Admitting: Family Medicine

## 2019-08-17 ENCOUNTER — Ambulatory Visit: Payer: Medicare Other

## 2019-08-17 ENCOUNTER — Other Ambulatory Visit: Payer: Self-pay

## 2019-08-17 ENCOUNTER — Ambulatory Visit (INDEPENDENT_AMBULATORY_CARE_PROVIDER_SITE_OTHER): Payer: Medicare Other | Admitting: Family Medicine

## 2019-08-17 VITALS — BP 131/67

## 2019-08-17 DIAGNOSIS — R202 Paresthesia of skin: Secondary | ICD-10-CM

## 2019-08-17 DIAGNOSIS — M25512 Pain in left shoulder: Secondary | ICD-10-CM | POA: Insufficient documentation

## 2019-08-17 DIAGNOSIS — M79602 Pain in left arm: Secondary | ICD-10-CM

## 2019-08-17 DIAGNOSIS — M79601 Pain in right arm: Secondary | ICD-10-CM

## 2019-08-17 NOTE — Progress Notes (Signed)
Name: Brian [name]   MRN: [id number]    DOB: [birth year]   Date:08/17/2019       Progress Note  Subjective:    Chief Complaint  Chief Complaint  Patient presents with  . Pain    Left shoulder pain and sometimes numbness at the tip of left fingers on and off.  Patient would like a x-ray    I connected with  [name]  on 08/17/19 at  9:40 AM EST by a video enabled telemedicine application and verified that I am speaking with the correct person using two identifiers.  I discussed the limitations of evaluation and management by telemedicine and the availability of in person appointments. The patient expressed understanding and agreed to proceed. Staff also discussed with the patient that there may be a patient responsible charge related to this service. Patient Location: home Provider Location: home Additional Individuals present: none  Shoulder Pain  The pain is present in the left shoulder. This is a new problem. The current episode started 1 to 4 weeks ago. There has been no history of extremity trauma. The problem occurs intermittently. The problem has been waxing and waning. The quality of the pain is described as aching and sharp (fingers on right and left fingers sometimes tingle). The pain is moderate (mild to moderate). Pertinent negatives include no fever. Exacerbated by: lifting left arm sometimes hurts, sometimes wakes him from sleep. He has tried heat (topical medicine OTC - aspercream) for the symptoms. The treatment provided moderate relief.   Pt also complains that the ends of his fingers bilaterally and thumb on left side have intermittent tingling, numbness and slightly discomfort, "they just feel a little different" no color change, swelling, weakness.  Thumb on left seems sore radiating into hand and wrist.  He wasn't sure if it was neuropathy from DM, although he also says A1C was recently checked in [place] and was 7.1, which is an improvement.  He also  sees Nephrology once a year - appt coming up next month.    Last labs from Sept showed - anemia, low platelets.  He had come in a few times since then with multiple complaints about meds and SE, had stopped meds and was often confused in the office, did not understand simple instructions though we tried multiple times, and he even stayed in the office waiting room or exam room for hours on two separate occassions despite clear instructions on plan.  I have explained to him today multiple times where to get an xray.  Will see if his wife can accompany him on subsequent visits.  Due to difficulty in some simple things, pts lab abnormalities were inadvertantly not addressed.  Will have him come in next week and f/up and get labs rechecked.  Patient Active Problem List   Diagnosis Date Noted  . Congestive heart failure ([place]) 03/14/2019  . Uncontrolled type 2 diabetes mellitus with diabetic nephropathy ([place]) 02/22/2018  . Heart murmur 11/04/2017  . Erectile dysfunction 09/10/2017  . Diastolic dysfunction without heart failure 06/08/2017  . Aortic stenosis, moderate 09/21/2015  . Hypertension goal BP (blood pressure) < 140/90 05/08/2015  . Hyperlipidemia LDL goal <100 05/08/2015  . Diverticulosis of colon 05/08/2015  . Secondary hyperparathyroidism of renal origin ([place]) 05/08/2015  . Chronic kidney disease (CKD), stage III (moderate) 05/08/2015  . Irritable bowel syndrome with constipation 05/08/2015  . Benign localized prostatic hyperplasia with lower urinary tract symptoms (LUTS) 05/08/2015    Social History  Tobacco Use  . Smoking status: Former Smoker    Packs/day: 0.75    Years: 39.00    Pack years: 29.25    Types: Cigarettes    Quit date: 03/21/1993    Years since quitting: 26.4  . Smokeless tobacco: Never Used  . Tobacco comment: smoking cessation materials not required  Substance Use Topics  . Alcohol use: No    Alcohol/week: 0.0 standard drinks     Current Outpatient  Medications:  .  ACCU-CHEK GUIDE test strip, USE UP TO 4 TIMES A DAY AS DIRECTED, Disp: 100 strip, Rfl: 0 .  amLODipine (NORVASC) 10 MG tablet, TAKE 1 TABLET (10 MG TOTAL) BY MOUTH DAILY. (FOR HIGH BLOOD PRESSURE), Disp: 90 tablet, Rfl: 2 .  aspirin EC 81 MG tablet, Take 81 mg by mouth daily. Reported on 09/17/2015, Disp: , Rfl:  .  blood glucose meter kit and supplies KIT, Dispense based on patient and insurance preference. Use up to four times daily as directed. (FOR ICD-9 250.00, 250.01)., Disp: 1 each, Rfl: 0 .  Blood Glucose Monitoring Suppl (ONE TOUCH ULTRA 2) W/DEVICE KIT, , Disp: , Rfl:  .  docusate sodium (DOK) 100 MG capsule, Take 1 capsule (100 mg total) by mouth 2 (two) times daily as needed., Disp: 100 capsule, Rfl: 5 .  finasteride (PROSCAR) 5 MG tablet, Take 1 tablet (5 mg total) by mouth daily., Disp: 90 tablet, Rfl: 3 .  glucose blood test strip, Use as instructed, Disp: 100 each, Rfl: 12 .  Insulin Detemir (LEVEMIR FLEXTOUCH) 100 UNIT/ML Pen, Inject 10-20 Units into the skin daily., Disp: 15 mL, Rfl: 1 .  Insulin Pen Needle (B-D UF III MINI PEN NEEDLES) 31G X 5 MM MISC, USE AS DIRECTED WITH LEVEMIR INSULIN PENS AT BEDTIME, once a day, Disp: 100 each, Rfl: 3 .  Insulin Pen Needle 32G X 4 MM MISC, Use as directed with insulin daily for IDDM, Disp: 100 each, Rfl: 1 .  nystatin-triamcinolone (MYCOLOG II) cream, APPLY TO AFFECTED AREAS BID PRN for rash or itching., Disp: 60 g, Rfl: 0 .  Omega-3 Fatty Acids (FISH OIL) 1000 MG CAPS, Take 1 capsule (1,000 mg total) by mouth daily., Disp: 90 capsule, Rfl: 3 .  quinapril (ACCUPRIL) 20 MG tablet, TAKE 1 TABLET BY MOUTH TWICE A DAY, Disp: 180 tablet, Rfl: 2 .  rosuvastatin (CRESTOR) 5 MG tablet, TAKE ONE TAB BY MOUTH FOUR NIGHTS A WEEK, Disp: 52 tablet, Rfl: 1 .  sildenafil (REVATIO) 20 MG tablet, Take 1-4 tablets (20-80 mg total) by mouth as needed (take 30 min prior to sexual activity, max 100 mg per day)., Disp: 20 tablet, Rfl: 3 .   tamsulosin (FLOMAX) 0.4 MG CAPS capsule, TAKE 1 CAPSULE BY MOUTH EVERY DAY, Disp: 90 capsule, Rfl: 1  Allergies  Allergen Reactions  . Carvedilol Other (See Comments)    bradycardia  . Niaspan [Niacin Er]   . Shellfish Allergy Other (See Comments)    Pt vomits  . Statins Other (See Comments)    Myalgia  . Sulfa Antibiotics   . Zetia [Ezetimibe]   . Januvia [Sitagliptin] Rash    I personally reviewed active problem list, medication list, allergies, family history, social history, health maintenance, notes from last encounter, lab results, imaging with the patient/caregiver today.  Review of Systems  Constitutional: Negative.  Negative for activity change, appetite change, chills, diaphoresis, fatigue, fever and unexpected weight change.  HENT: Negative.   Eyes: Negative.   Respiratory: Negative.   Cardiovascular:  Negative.   Gastrointestinal: Negative.   Endocrine: Negative.   Genitourinary: Negative.   Musculoskeletal: Positive for arthralgias.  Skin: Negative.  Negative for color change, pallor and rash.  Allergic/Immunologic: Negative.   Neurological: Negative.  Negative for dizziness, tremors, syncope, weakness, light-headedness and headaches.  Hematological: Negative.  Negative for adenopathy. Does not bruise/bleed easily.  Psychiatric/Behavioral: Negative.   All other systems reviewed and are negative.    Objective:   Virtual encounter, vitals limited, only able to obtain the following Today's Vitals   08/17/19 0921  BP: 131/67   There is no height or weight on file to calculate BMI. Nursing Note and Vital Signs reviewed.  Physical Exam Pt alert, phonation clear Pt forgetful about things we have discussed in same visit (memory impaired?) Oriented to person place and time PE limited by telephone encounter  No results found for this or any previous visit (from the past 72 hour(s)).  Assessment and Plan:     ICD-10-CM   1. Left shoulder pain, unspecified  chronicity  M25.512 DG Shoulder Left  2. Paresthesia and pain of both upper extremities  R20.2    M79.601    M79.602     Pt encouraged to get Xray at his convenience - mostly likely it is OA, no injury, comes and goes, pain mild, managed well with topical creams and heating pad.  For sx in arms/fingers/numbness and some mild discomfort will have him come in next week to examine him and obtain labs.  Need to f/up on thombocytopenia, anemia - work up for iron deficiency, and recheck labs - may be related to his sx, but also may be other etiology like neuropathy secondary to DM, cervical radiculopathy, carpal tunnel, hypothyroid.  Ddx difficult with history over the phone and no physical exam.  Pt was scheduled for his routine f/up in a few weeks, so we will move that up and check on his other acute complaints.   -Red flags and when to present for emergency care or RTC including fever >101.57F, chest pain, shortness of breath, new/worsening/un-resolving symptoms, reviewed with patient at time of visit. Follow up and care instructions discussed and provided in AVS. - I discussed the assessment and treatment plan with the patient. The patient was provided an opportunity to ask questions and all were answered. The patient agreed with the plan and demonstrated an understanding of the instructions.  I provided 23 minutes of non-face-to-face time during this encounter.  Delsa Grana, PA-C 08/17/19 9:41 AM

## 2019-08-22 ENCOUNTER — Ambulatory Visit (INDEPENDENT_AMBULATORY_CARE_PROVIDER_SITE_OTHER): Payer: Medicare Other | Admitting: Family Medicine

## 2019-08-22 ENCOUNTER — Encounter: Payer: Self-pay | Admitting: Family Medicine

## 2019-08-22 ENCOUNTER — Other Ambulatory Visit: Payer: Self-pay

## 2019-08-22 VITALS — BP 136/80 | HR 103 | Temp 97.1°F | Resp 14 | Ht 68.0 in | Wt 167.2 lb

## 2019-08-22 DIAGNOSIS — E1165 Type 2 diabetes mellitus with hyperglycemia: Secondary | ICD-10-CM

## 2019-08-22 DIAGNOSIS — M79602 Pain in left arm: Secondary | ICD-10-CM

## 2019-08-22 DIAGNOSIS — N2581 Secondary hyperparathyroidism of renal origin: Secondary | ICD-10-CM

## 2019-08-22 DIAGNOSIS — R202 Paresthesia of skin: Secondary | ICD-10-CM

## 2019-08-22 DIAGNOSIS — I10 Essential (primary) hypertension: Secondary | ICD-10-CM

## 2019-08-22 DIAGNOSIS — N183 Chronic kidney disease, stage 3 unspecified: Secondary | ICD-10-CM

## 2019-08-22 DIAGNOSIS — E785 Hyperlipidemia, unspecified: Secondary | ICD-10-CM

## 2019-08-22 DIAGNOSIS — D696 Thrombocytopenia, unspecified: Secondary | ICD-10-CM

## 2019-08-22 DIAGNOSIS — M25512 Pain in left shoulder: Secondary | ICD-10-CM | POA: Diagnosis not present

## 2019-08-22 DIAGNOSIS — M79601 Pain in right arm: Secondary | ICD-10-CM

## 2019-08-22 DIAGNOSIS — D649 Anemia, unspecified: Secondary | ICD-10-CM

## 2019-08-22 DIAGNOSIS — Z5181 Encounter for therapeutic drug level monitoring: Secondary | ICD-10-CM

## 2019-08-22 DIAGNOSIS — E1121 Type 2 diabetes mellitus with diabetic nephropathy: Secondary | ICD-10-CM

## 2019-08-22 DIAGNOSIS — IMO0002 Reserved for concepts with insufficient information to code with codable children: Secondary | ICD-10-CM

## 2019-08-22 MED ORDER — PREGABALIN 25 MG PO CAPS: 25.0000 mg | ORAL_CAPSULE | Freq: Two times a day (BID) | ORAL | 1 refills | Status: DC

## 2019-08-22 NOTE — Patient Instructions (Signed)
Please return the stool cards so we can make sure you do not have any blood loss causing your anemia  We will call you with lab results  You can start lyrica twice a day to help with pain, please let us know if it causes any side effects or makes you too tired.

## 2019-08-22 NOTE — Progress Notes (Signed)
Name: Brian [name]   MRN: [id number]    DOB: February 29, 1934   Date:08/22/2019       Progress Note  Chief Complaint  Patient presents with  . Follow-up  . Arm Pain    numbness in fingers     Subjective:   Brian [name] is a 85 y.o. male, presents to clinic for routine follow up on the conditions listed above.  In office to examine his complaints which were evaluated via virtual encounter last week, he did get a x-ray of his left shoulder which was pertinent for mild to moderate AC joint degenerative spur formation, patient today does not want to go to orthopedic he states due to cost.  He has numbness and paresthesias in both of his arms hands and fingers.  Left arm and shoulder pain is worse with movement -with abduction and flexion.  And continues to deny any neck pain stiffness or decreased range of motion.  Hypertension:  Currently managed on norvasc 10 mg, quinapril 20 mg,  Pt reports good med compliance and denies any SE.  No lightheadedness, hypotension, syncope. Blood pressure today is well controlled. BP Readings from Last 3 Encounters:  08/22/19 136/80  08/17/19 131/67  06/21/19 140/60   Pt denies CP, SOB, exertional sx, LE edema, palpitation, Ha's, visual disturbances Dietary efforts for BP?  Healthy, low salt  Hyperlipidemia: Current Medication Regimen:  crestor 5 mg, good compliance, no SE or concerns Last Lipids: Lab Results  Component Value Date   CHOL 187 05/04/2019   HDL 68 05/04/2019   LDLCALC 106 (H) 05/04/2019   TRIG 52 05/04/2019   CHOLHDL 2.8 05/04/2019   - Current Diet: - Denies: Chest pain, shortness of breath, myalgias. - Documented aortic atherosclerosis? Yes - Risk factors for atherosclerosis: diabetes mellitus, hypercholesterolemia and hypertension   DM - per endocrine, he states he went to specialist and states that his A1c was improved but I am unable to see this in care everywhere or in our EMR.  Anemia - pts Hgb has gradually  decreased over the past year, pt was new to me a few months ago. hgb 07/2016 12.2, 11/01/2018 12.4, 05/04/2019 11.3 Also hx of thrombocytopenia, platelets 91 11/01/2018, 173 11/02/2018, then 105 05/04/2019 No melena, hematochezia change in stool caliber, normal BM, no abd pain No fatigue, increased sleeping/napping, DOE, CP, rapid HR, cold intolerance Will recheck labs today - and do stool cards explained to pt     Patient Active Problem List   Diagnosis Date Noted  . Congestive heart failure ([place]) 03/14/2019  . Uncontrolled type 2 diabetes mellitus with diabetic nephropathy ([place]) 02/22/2018  . Heart murmur 11/04/2017  . Erectile dysfunction 09/10/2017  . Diastolic dysfunction without heart failure 06/08/2017  . Aortic stenosis, moderate 09/21/2015  . Hypertension goal BP (blood pressure) < 140/90 05/08/2015  . Hyperlipidemia LDL goal <100 05/08/2015  . Diverticulosis of colon 05/08/2015  . Secondary hyperparathyroidism of renal origin ([place]) 05/08/2015  . Chronic kidney disease (CKD), stage III (moderate) 05/08/2015  . Irritable bowel syndrome with constipation 05/08/2015  . Benign localized prostatic hyperplasia with lower urinary tract symptoms (LUTS) 05/08/2015    Past Surgical History:  Procedure Laterality Date  . CYSTOSCOPY/RETROGRADE/URETEROSCOPY/STONE EXTRACTION WITH BASKET N/A 03/22/2013   Procedure: CYSTOSCOPY BLADDER STONE STONE EXTRACTION;  Surgeon: [name], MD;  Location: [facility];  Service: Urology;  Laterality: N/A;  CYSTOSCOPY    . TONSILLECTOMY      Family History  Problem Relation Age of  Onset  . Congestive Heart Failure Other   . Heart attack Other   . Heart disease Other   . Diabetes Brother     Social History   Socioeconomic History  . Marital status: Married    Spouse name: [name]  . Number of children: 4  . Years of education: some college  . Highest education level: 12th grade  Occupational History  . Occupation: Retired    Tobacco Use  . Smoking status: Former Smoker    Packs/day: 0.75    Years: 39.00    Pack years: 29.25    Types: Cigarettes    Quit date: 03/21/1993    Years since quitting: 26.4  . Smokeless tobacco: Never Used  . Tobacco comment: smoking cessation materials not required  Substance and Sexual Activity  . Alcohol use: No    Alcohol/week: 0.0 standard drinks  . Drug use: No  . Sexual activity: Yes  Other Topics Concern  . Not on file  Social History Narrative   Lives with wife in [place], [place].   Social Determinants of Health   Financial Resource Strain:   . Difficulty of Paying Living Expenses: Not on file  Food Insecurity:   . Worried About Charity fundraiser in the Last Year: Not on file  . Ran Out of Food in the Last Year: Not on file  Transportation Needs:   . Lack of Transportation (Medical): Not on file  . Lack of Transportation (Non-Medical): Not on file  Physical Activity:   . Days of Exercise per Week: Not on file  . Minutes of Exercise per Session: Not on file  Stress:   . Feeling of Stress : Not on file  Social Connections:   . Frequency of Communication with Friends and Family: Not on file  . Frequency of Social Gatherings with Friends and Family: Not on file  . Attends Religious Services: Not on file  . Active Member of Clubs or Organizations: Not on file  . Attends Archivist Meetings: Not on file  . Marital Status: Not on file  Intimate Partner Violence:   . Fear of Current or Ex-Partner: Not on file  . Emotionally Abused: Not on file  . Physically Abused: Not on file  . Sexually Abused: Not on file     Current Outpatient Medications:  .  amLODipine (NORVASC) 10 MG tablet, TAKE 1 TABLET (10 MG TOTAL) BY MOUTH DAILY. (FOR HIGH BLOOD PRESSURE), Disp: 90 tablet, Rfl: 2 .  aspirin EC 81 MG tablet, Take 81 mg by mouth daily. Reported on 09/17/2015, Disp: , Rfl:  .  docusate sodium (DOK) 100 MG capsule, Take 1 capsule (100 mg total) by mouth 2  (two) times daily as needed., Disp: 100 capsule, Rfl: 5 .  finasteride (PROSCAR) 5 MG tablet, Take 1 tablet (5 mg total) by mouth daily., Disp: 90 tablet, Rfl: 3 .  glucose blood test strip, Use as instructed, Disp: 100 each, Rfl: 12 .  Insulin Detemir (LEVEMIR FLEXTOUCH) 100 UNIT/ML Pen, Inject 10-20 Units into the skin daily., Disp: 15 mL, Rfl: 1 .  Insulin Pen Needle (B-D UF III MINI PEN NEEDLES) 31G X 5 MM MISC, USE AS DIRECTED WITH LEVEMIR INSULIN PENS AT BEDTIME, once a day, Disp: 100 each, Rfl: 3 .  Insulin Pen Needle 32G X 4 MM MISC, Use as directed with insulin daily for IDDM, Disp: 100 each, Rfl: 1 .  nystatin-triamcinolone (MYCOLOG II) cream, APPLY TO AFFECTED AREAS BID PRN for rash  or itching., Disp: 60 g, Rfl: 0 .  Omega-3 Fatty Acids (FISH OIL) 1000 MG CAPS, Take 1 capsule (1,000 mg total) by mouth daily., Disp: 90 capsule, Rfl: 3 .  quinapril (ACCUPRIL) 20 MG tablet, TAKE 1 TABLET BY MOUTH TWICE A DAY, Disp: 180 tablet, Rfl: 2 .  rosuvastatin (CRESTOR) 5 MG tablet, TAKE ONE TAB BY MOUTH FOUR NIGHTS A WEEK, Disp: 52 tablet, Rfl: 1 .  sildenafil (REVATIO) 20 MG tablet, Take 1-4 tablets (20-80 mg total) by mouth as needed (take 30 min prior to sexual activity, max 100 mg per day)., Disp: 20 tablet, Rfl: 3 .  tamsulosin (FLOMAX) 0.4 MG CAPS capsule, TAKE 1 CAPSULE BY MOUTH EVERY DAY, Disp: 90 capsule, Rfl: 1 .  ACCU-CHEK GUIDE test strip, USE UP TO 4 TIMES A DAY AS DIRECTED, Disp: 100 strip, Rfl: 0 .  blood glucose meter kit and supplies KIT, Dispense based on patient and insurance preference. Use up to four times daily as directed. (FOR ICD-9 250.00, 250.01)., Disp: 1 each, Rfl: 0 .  Blood Glucose Monitoring Suppl (ONE TOUCH ULTRA 2) W/DEVICE KIT, , Disp: , Rfl:   Allergies  Allergen Reactions  . Carvedilol Other (See Comments)    bradycardia  . Niaspan [Niacin Er]   . Shellfish Allergy Other (See Comments)    Pt vomits  . Statins Other (See Comments)    Myalgia  . Sulfa  Antibiotics   . Zetia [Ezetimibe]   . Januvia [Sitagliptin] Rash    I personally reviewed active problem list, medication list, allergies, family history, social history, health maintenance, notes from last encounter, lab results, imaging with the patient/caregiver today.  Review of Systems  Constitutional: Negative.   HENT: Negative.   Eyes: Negative.   Respiratory: Negative.   Cardiovascular: Negative.   Gastrointestinal: Negative.   Endocrine: Negative.   Genitourinary: Negative.   Musculoskeletal: Negative.   Skin: Negative.   Allergic/Immunologic: Negative.   Neurological: Negative.   Hematological: Negative.   Psychiatric/Behavioral: Negative.   All other systems reviewed and are negative.       Objective:    Vitals:   08/22/19 0942  BP: 136/80  Pulse: (!) 103  Resp: 14  Temp: (!) 97.1 F (36.2 C)  SpO2: 94%  Weight: 167 lb 3.2 oz (75.8 kg)  Height: '5\' 8"'$  (1.727 m)    Body mass index is 25.42 kg/m.  Physical Exam Vitals and nursing note reviewed.  Constitutional:      General: He is not in acute distress.    Appearance: Normal appearance. He is well-developed. He is not ill-appearing, toxic-appearing or diaphoretic.     Interventions: Face mask in place.  HENT:     Head: Normocephalic and atraumatic.     Jaw: No trismus.     Right Ear: External ear normal.     Left Ear: External ear normal.  Eyes:     General: Lids are normal. No scleral icterus.    Conjunctiva/sclera: Conjunctivae normal.     Pupils: Pupils are equal, round, and reactive to light.  Neck:     Trachea: Trachea and phonation normal. No tracheal deviation.  Cardiovascular:     Rate and Rhythm: Normal rate and regular rhythm.     Pulses: Normal pulses.          Radial pulses are 2+ on the right side and 2+ on the left side.       Posterior tibial pulses are 2+ on the right side and 2+ on  the left side.     Heart sounds: Murmur present. No friction rub. No gallop.   Pulmonary:      Effort: Pulmonary effort is normal. No respiratory distress.     Breath sounds: Normal breath sounds. No stridor. No wheezing, rhonchi or rales.  Abdominal:     General: Bowel sounds are normal. There is no distension.     Palpations: Abdomen is soft.     Tenderness: There is no abdominal tenderness. There is no guarding or rebound.  Musculoskeletal:     Right shoulder: No swelling, deformity, tenderness, bony tenderness or crepitus. Normal range of motion. Normal strength. Normal pulse.     Left shoulder: No swelling, deformity, tenderness, bony tenderness or crepitus. Decreased range of motion. Normal strength. Normal pulse.     Right elbow: No swelling. Normal range of motion.     Left elbow: No swelling. Normal range of motion.     Right wrist: No swelling, tenderness or bony tenderness. Normal range of motion.     Left wrist: No swelling, tenderness or bony tenderness. Normal range of motion.     Right hand: Normal. No swelling, deformity, lacerations, tenderness or bony tenderness. Normal range of motion. Normal strength. Normal sensation. There is no disruption of two-point discrimination. Normal capillary refill. Normal pulse.     Left hand: Normal. No swelling, deformity, lacerations, tenderness or bony tenderness. Normal range of motion. Normal strength. Normal sensation. There is no disruption of two-point discrimination. Normal capillary refill. Normal pulse.     Cervical back: Normal range of motion and neck supple.     Right lower leg: No edema.     Left lower leg: No edema.     Comments: Left shoulder no ttp to clavicle, AC joint, biceps groove, glenohumeral fossa.  Tenderness to palpation to trapezius between left shoulder and cervical paraspinal muscles  Skin:    General: Skin is warm and dry.     Capillary Refill: Capillary refill takes less than 2 seconds.     Coloration: Skin is not jaundiced.     Findings: No rash.     Nails: There is no clubbing.  Neurological:      Mental Status: He is alert.     Cranial Nerves: No dysarthria or facial asymmetry.     Sensory: Sensation is intact.     Motor: No weakness, tremor or abnormal muscle tone.     Gait: Gait normal.     Comments: 5/5 strength b/l to UE   Psychiatric:        Mood and Affect: Mood normal.        Speech: Speech normal.        Behavior: Behavior normal. Behavior is cooperative.      No results found for this or any previous visit (from the past 2160 hour(s)).  PHQ2/9: Depression screen [facility] 2/9 08/22/2019 08/17/2019 06/21/2019 05/04/2019 03/14/2019  Decreased Interest 0 0 0 0 0  Down, Depressed, Hopeless 0 0 0 0 0  PHQ - 2 Score 0 0 0 0 0  Altered sleeping 0 0 0 0 0  Tired, decreased energy 0 0 0 0 0  Change in appetite 0 0 0 0 0  Feeling bad or failure about yourself  0 0 0 0 0  Trouble concentrating 0 0 0 0 0  Moving slowly or fidgety/restless 0 0 0 0 0  Suicidal thoughts 0 0 0 0 0  PHQ-9 Score 0 0 0 0 0  Difficult doing  work/chores Not difficult at all Not difficult at all Not difficult at all Not difficult at all Not difficult at all  Some recent data might be hidden    phq 9 is negative Reviewed today  Fall Risk: Fall Risk  08/22/2019 08/17/2019 06/21/2019 05/04/2019 03/14/2019  Falls in the past year? 1 1 0 0 0  Number falls in past yr: 1 0 0 0 0  Comment - 2-3 month ago getting off bus. - - -  Injury with Fall? 0 0 0 0 0  Risk for fall due to : - - - - -  Risk for fall due to: Comment - - - - -      Functional Status Survey: Is the patient deaf or have difficulty hearing?: No Does the patient have difficulty seeing, even when wearing glasses/contacts?: No Does the patient have difficulty concentrating, remembering, or making decisions?: No Does the patient have difficulty walking or climbing stairs?: No Does the patient have difficulty dressing or bathing?: No Does the patient have difficulty doing errands alone such as visiting a doctor's office or shopping?:  No    Assessment & Plan:     ICD-10-CM   1. Left shoulder pain, unspecified chronicity  M25.512 pregabalin (LYRICA) 25 MG capsule   Encouraged patient to do physical therapy and to follow-up with orthopedist but he declines  2. Paresthesia and pain of both upper extremities  R20.2 A1C   M79.601 CMP w GFR   M79.602 CBC +diff and smear    Iron, TIBC and Ferritin Panel    TSH    pregabalin (LYRICA) 25 MG capsule   good sensation, strength, no deficit light touch, sharp/dull or two-point discrimination, unclear etiology screening labs, may need neuro or ortho, labs r/o   3. Uncontrolled type 2 diabetes mellitus with diabetic nephropathy (HCC)  E11.21 A1C   E11.65 CMP w GFR    Lipid Panel   states he went to specialist, do not see in care everywhere, will recheck labs  4. Stage 3 chronic kidney disease, unspecified whether stage 3a or 3b CKD  N18.30 CMP w GFR   per nephro  5. Hyperlipidemia LDL goal <100  E78.5 CMP w GFR    Lipid Panel   compliant with meds, recheck labs  6. Hypertension goal BP (blood pressure) < 140/90  I10 CMP w GFR   well controlled, stable  7. Medication monitoring encounter  Z51.81 A1C    CMP w GFR    Lipid Panel    CBC +diff and smear    Iron, TIBC and Ferritin Panel    TSH  8. Secondary hyperparathyroidism of renal origin ([place])  N25.81 TSH  9. Thrombocytopenia (HCC)  D69.6 CBC +diff and smear   recheck  10. Anemia, unspecified type  D64.9 CBC +diff and smear    Iron, TIBC and Ferritin Panel    TSH   r/o blood loss, recheck labs   Encouraged pt to do PT, and f/up with ortho or neuro regarding numbness and tingling and pain today will do labs to rule out deficiency, hypothyroid.  Some decreased range of motion with left shoulder but cervical spine grossly normal, good upper extremity sensation strength, capillary refill.  Starting treatment with Lyrica for his pain have encouraged him to follow-up if they are side effects or is ineffective    No  follow-ups on file.   Delsa Grana, PA-C 08/22/19 11:01 AM

## 2019-08-23 LAB — LIPID PANEL
Cholesterol: 184 mg/dL (ref <200)
HDL: 68 mg/dL (ref >=40)
LDL Cholesterol (Calc): 99 mg/dL (calc)
Non-HDL Cholesterol (Calc): 116 mg/dL (calc) (ref <130)
Total CHOL/HDL Ratio: 2.7 (calc) (ref <5.0)
Triglycerides: 79 mg/dL (ref <150)

## 2019-08-23 LAB — IRON,TIBC AND FERRITIN PANEL
%SAT: 28 % (calc) (ref 20–48)
Ferritin: 189 ng/mL (ref 24–380)
Iron: 68 ug/dL (ref 50–180)
TIBC: 243 mcg/dL (calc) — ABNORMAL LOW (ref 250–425)

## 2019-08-23 LAB — COMPLETE METABOLIC PANEL WITH GFR
AG Ratio: 1.4 (calc) (ref 1.0–2.5)
ALT: 14 U/L (ref 9–46)
AST: 18 U/L (ref 10–35)
Albumin: 4.1 g/dL (ref 3.6–5.1)
Alkaline phosphatase (APISO): 58 U/L (ref 35–144)
BUN/Creatinine Ratio: 16 (calc) (ref 6–22)
BUN: 22 mg/dL (ref 7–25)
CO2: 25 mmol/L (ref 20–32)
Calcium: 9.6 mg/dL (ref 8.6–10.3)
Chloride: 107 mmol/L (ref 98–110)
Creat: 1.34 mg/dL — ABNORMAL HIGH (ref 0.70–1.11)
GFR, Est African American: 56 mL/min/{1.73_m2} — ABNORMAL LOW (ref >=60)
GFR, Est Non African American: 49 mL/min/{1.73_m2} — ABNORMAL LOW (ref >=60)
Globulin: 2.9 g/dL (calc) (ref 1.9–3.7)
Glucose, Bld: 284 mg/dL — ABNORMAL HIGH (ref 65–99)
Potassium: 4.6 mmol/L (ref 3.5–5.3)
Sodium: 141 mmol/L (ref 135–146)
Total Bilirubin: 0.4 mg/dL (ref 0.2–1.2)
Total Protein: 7 g/dL (ref 6.1–8.1)

## 2019-08-23 LAB — CBC (INCLUDES DIFF/PLT) WITH PATHOLOGIST REVIEW
Absolute Monocytes: 582 cells/uL (ref 200–950)
Basophils Absolute: 19 cells/uL (ref 0–200)
Basophils Relative: 0.3 %
Eosinophils Absolute: 102 cells/uL (ref 15–500)
Eosinophils Relative: 1.6 %
HCT: 38.1 % — ABNORMAL LOW (ref 38.5–50.0)
Hemoglobin: 12.7 g/dL — ABNORMAL LOW (ref 13.2–17.1)
Lymphs Abs: 1754 cells/uL (ref 850–3900)
MCH: 29.6 pg (ref 27.0–33.0)
MCHC: 33.3 g/dL (ref 32.0–36.0)
MCV: 88.8 fL (ref 80.0–100.0)
MPV: 11.3 fL (ref 7.5–12.5)
Monocytes Relative: 9.1 %
Neutro Abs: 3942 cells/uL (ref 1500–7800)
Neutrophils Relative %: 61.6 %
Platelets: 190 10*3/uL (ref 140–400)
RBC: 4.29 10*6/uL (ref 4.20–5.80)
RDW: 12.7 % (ref 11.0–15.0)
Total Lymphocyte: 27.4 %
WBC: 6.4 10*3/uL (ref 3.8–10.8)

## 2019-08-23 LAB — TSH: TSH: 1.71 mIU/L (ref 0.40–4.50)

## 2019-08-23 LAB — HEMOGLOBIN A1C
Hgb A1c MFr Bld: 7.7 % of total Hgb — ABNORMAL HIGH (ref <5.7)
Mean Plasma Glucose: 174 (calc)
eAG (mmol/L): 9.7 (calc)

## 2019-08-24 ENCOUNTER — Telehealth: Payer: Self-pay

## 2019-08-24 NOTE — Telephone Encounter (Signed)
Pt also has problems with meds.  Maybe try gabapentnin

## 2019-08-24 NOTE — Telephone Encounter (Signed)
Copied from [place] [phone]. Topic: General - Inquiry >> Aug 24, 2019  8:16 AM Richardo Priest, NT wrote: Reason for CRM: Pt's wife called in stating the medication that was given to pt yesterday is something he does not want to take and would like alternative. Pt's wife and pt have experienced side effects due to this medication before and does not feel comfortable to take it. Please advise.

## 2019-08-24 NOTE — Telephone Encounter (Signed)
Ok. I will let him know.

## 2019-08-29 ENCOUNTER — Encounter: Payer: Self-pay | Admitting: Family Medicine

## 2019-09-01 ENCOUNTER — Telehealth: Payer: Self-pay | Admitting: Family Medicine

## 2019-09-01 NOTE — Telephone Encounter (Signed)
Meter has been changed from one touch to Acc-ucheck guide me   Pt needs a Rx for the meter, test strips and supplies / please advise

## 2019-09-02 ENCOUNTER — Ambulatory Visit: Payer: Medicare Other | Admitting: Family Medicine

## 2019-09-02 NOTE — Telephone Encounter (Signed)
Melissa told pharmacy yesterday he needs to get new orders from endocrine

## 2019-09-07 ENCOUNTER — Telehealth: Payer: Self-pay

## 2019-09-07 DIAGNOSIS — M25512 Pain in left shoulder: Secondary | ICD-10-CM

## 2019-09-07 DIAGNOSIS — G8929 Other chronic pain: Secondary | ICD-10-CM

## 2019-09-07 NOTE — Telephone Encounter (Signed)
Can you please put in the referral with that info and I'll cosign -LT

## 2019-09-07 NOTE — Telephone Encounter (Signed)
Copied from [place] [phone]. Topic: Referral - Request for Referral >> Sep 07, 2019  7:31 AM Scherrie Gerlach wrote: Pt would like to see an orthopedic for the left shoulder and hand pain.  Pt wants to see Dr Donavan Burnet.   [address], [place], [place] [address] Pt would like you to expedite.

## 2019-09-15 ENCOUNTER — Ambulatory Visit (INDEPENDENT_AMBULATORY_CARE_PROVIDER_SITE_OTHER): Payer: Medicare PPO

## 2019-09-15 ENCOUNTER — Other Ambulatory Visit: Payer: Self-pay

## 2019-09-15 VITALS — Ht 68.0 in | Wt 167.0 lb

## 2019-09-15 DIAGNOSIS — Z0000 Encounter for general adult medical examination without abnormal findings: Secondary | ICD-10-CM

## 2019-09-15 DIAGNOSIS — Z789 Other specified health status: Secondary | ICD-10-CM | POA: Diagnosis not present

## 2019-09-15 NOTE — Addendum Note (Signed)
Addended by: Clemetine Marker D on: 09/15/2019 02:58 PM   Modules accepted: Orders

## 2019-09-15 NOTE — Progress Notes (Signed)
Subjective:   Brian [name] is a 86 y.o. male who presents for Medicare Annual/Subsequent preventive examination.  Virtual Visit via Telephone Note  I connected with Brian [name] on 09/15/19 at  1:30 PM EST by telephone and verified that I am speaking with the correct person using two identifiers.  Medicare Annual Wellness visit completed telephonically due to Covid-19 pandemic.   Location: Patient: home Provider: office   I discussed the limitations, risks, security and privacy concerns of performing an evaluation and management service by telephone and the availability of in person appointments. The patient expressed understanding and agreed to proceed.  Some vital signs may be absent or patient reported.   Brian Marker, LPN    Review of Systems:   Cardiac Risk Factors include: advanced age (>25mn, >>71women);diabetes mellitus;dyslipidemia;male gender;hypertension     Objective:    Vitals: Ht '5\' 8"'$  (1.727 m)   Wt 167 lb (75.8 kg)   BMI 25.39 kg/m   Body mass index is 25.39 kg/m.  Advanced Directives 09/15/2019 04/30/2018 06/08/2017 02/27/2017 11/03/2016 08/12/2016 05/05/2016  Does Patient Have a Medical Advance Directive? Yes No Yes No Yes Yes Yes  Type of [place] - - - [place] will [place] Canyon City[place] will Living will  Copy of [place] Chart? No - copy requested - - - - - No - copy requested  Would patient like information on creating a medical advance directive? - Yes (MAU/Ambulatory/Procedural Areas - Information given) - - - - -  Pre-existing out of facility DNR order (yellow form or pink MOST form) - - - - - - -    Tobacco Social History   Tobacco Use  Smoking Status Former Smoker  . Packs/day: 0.75  . Years: 39.00  . Pack years: 29.25  . Types: Cigarettes  . Quit date: 03/21/1993  . Years since quitting: 26.5  Smokeless Tobacco Never  Used  Tobacco Comment   smoking cessation materials not required     Counseling given: Not Answered Comment: smoking cessation materials not required   Clinical Intake:  Pre-visit preparation completed: Yes  Pain : 0-10 Pain Score: 5  Pain Type: Chronic pain Pain Location: Shoulder Pain Descriptors / Indicators: Sore Pain Onset: More than a month ago Pain Frequency: Constant     BMI - recorded: 25.39 Nutritional Status: BMI 25 -29 Overweight Nutritional Risks: None Diabetes: Yes CBG done?: No Did pt. bring in CBG monitor from home?: No   Nutrition Risk Assessment:  Has the patient had any N/V/D within the last 2 months?  No  Does the patient have any non-healing wounds?  No  Has the patient had any unintentional weight loss or weight gain?  No   Diabetes:  Is the patient diabetic?  Yes  If diabetic, was a CBG obtained today?  No  Did the patient bring in their glucometer from home?  No  How often do you monitor your CBG's? 1-2 times daily.   Financial Strains and Diabetes Management:  Are you having any financial strains with the device, your supplies or your medication? No .  Does the patient want to be seen by Chronic Care Management for management of their diabetes?  Yes  Would the patient like to be referred to a Nutritionist or for Diabetic Management?  No   Diabetic Exams:  Diabetic Eye Exam: Completed 08/29/19.   Diabetic Foot Exam: Completed 11/01/18.   How often  do you need to have someone help you when you read instructions, pamphlets, or other written materials from your doctor or pharmacy?: 1 - Never  Interpreter Needed?: No  Information entered by :: Brian Marker LPN  Past Medical History:  Diagnosis Date  . Anemia of chronic renal failure 05/08/2015  . Anemia of chronic renal failure 05/08/2015  . Aortic stenosis, mild 09/21/2015  . Arthritis   . Bladder stone   . Chronic kidney disease (CKD), stage III (moderate) 05/08/2015   Followed by Dr.  Rolly Salter   . Congestive heart failure ([place]) 03/14/2019  . Diastolic dysfunction without heart failure 06/08/2017  . History of bladder stone   . Hyperlipidemia   . Hypertension   . Nocturia   . Renal disorder   . Type 2 diabetes mellitus ([place])    Past Surgical History:  Procedure Laterality Date  . CYSTOSCOPY/RETROGRADE/URETEROSCOPY/STONE EXTRACTION WITH BASKET N/A 03/22/2013   Procedure: CYSTOSCOPY BLADDER STONE STONE EXTRACTION;  Surgeon: [name], MD;  Location: [facility];  Service: Urology;  Laterality: N/A;  CYSTOSCOPY    . TONSILLECTOMY     Family History  Problem Relation Age of Onset  . Congestive Heart Failure Other   . Heart attack Other   . Heart disease Other   . Diabetes Brother    Social History   Socioeconomic History  . Marital status: Married    Spouse name: [name]  . Number of children: 4  . Years of education: some college  . Highest education level: 12th grade  Occupational History  . Occupation: Retired  Tobacco Use  . Smoking status: Former Smoker    Packs/day: 0.75    Years: 39.00    Pack years: 29.25    Types: Cigarettes    Quit date: 03/21/1993    Years since quitting: 26.5  . Smokeless tobacco: Never Used  . Tobacco comment: smoking cessation materials not required  Substance and Sexual Activity  . Alcohol use: No    Alcohol/week: 0.0 standard drinks  . Drug use: No  . Sexual activity: Yes  Other Topics Concern  . Not on file  Social History Narrative   Lives with wife in [place], [place].   Social Determinants of Health   Financial Resource Strain: Low Risk   . Difficulty of Paying Living Expenses: Not very hard  Food Insecurity: No Food Insecurity  . Worried About Charity fundraiser in the Last Year: Never true  . Ran Out of Food in the Last Year: Never true  Transportation Needs: No Transportation Needs  . Lack of Transportation (Medical): No  . Lack of Transportation (Non-Medical): No  Physical Activity:  Inactive  . Days of Exercise per Week: 0 days  . Minutes of Exercise per Session: 0 min  Stress: No Stress Concern Present  . Feeling of Stress : Not at all  Social Connections: Slightly Isolated  . Frequency of Communication with Friends and Family: More than three times a week  . Frequency of Social Gatherings with Friends and Family: More than three times a week  . Attends Religious Services: More than 4 times per year  . Active Member of Clubs or Organizations: No  . Attends Archivist Meetings: Never  . Marital Status: Married    Outpatient Encounter Medications as of 09/15/2019  Medication Sig  . ACCU-CHEK GUIDE test strip USE UP TO 4 TIMES A DAY AS DIRECTED  . Accu-Chek Softclix Lancets lancets   . amLODipine (NORVASC) 10 MG  tablet TAKE 1 TABLET (10 MG TOTAL) BY MOUTH DAILY. (FOR HIGH BLOOD PRESSURE)  . aspirin EC 81 MG tablet Take 81 mg by mouth daily. Reported on 09/17/2015  . COMBIGAN 0.2-0.5 % ophthalmic solution Apply 1 drop to eye 2 (two) times daily.  Marland Kitchen docusate sodium (DOK) 100 MG capsule Take 1 capsule (100 mg total) by mouth 2 (two) times daily as needed.  . finasteride (PROSCAR) 5 MG tablet Take 1 tablet (5 mg total) by mouth daily.  . Insulin Detemir (LEVEMIR FLEXTOUCH) 100 UNIT/ML Pen Inject 10-20 Units into the skin daily.  . Insulin Pen Needle (B-D UF III MINI PEN NEEDLES) 31G X 5 MM MISC USE AS DIRECTED WITH LEVEMIR INSULIN PENS AT BEDTIME, once a day  . Omega-3 Fatty Acids (FISH OIL) 1000 MG CAPS Take 1 capsule (1,000 mg total) by mouth daily.  . pregabalin (LYRICA) 25 MG capsule Take 1 capsule (25 mg total) by mouth 2 (two) times daily. For pain  . quinapril (ACCUPRIL) 20 MG tablet TAKE 1 TABLET BY MOUTH TWICE A DAY  . rosuvastatin (CRESTOR) 5 MG tablet TAKE ONE TAB BY MOUTH FOUR NIGHTS A WEEK  . sildenafil (REVATIO) 20 MG tablet Take 1-4 tablets (20-80 mg total) by mouth as needed (take 30 min prior to sexual activity, max 100 mg per day).  . tamsulosin  (FLOMAX) 0.4 MG CAPS capsule TAKE 1 CAPSULE BY MOUTH EVERY DAY  . [DISCONTINUED] nystatin-triamcinolone (MYCOLOG II) cream APPLY TO AFFECTED AREAS BID PRN for rash or itching.  . [DISCONTINUED] blood glucose meter kit and supplies KIT Dispense based on patient and insurance preference. Use up to four times daily as directed. (FOR ICD-9 250.00, 250.01).  . [DISCONTINUED] Blood Glucose Monitoring Suppl (ONE TOUCH ULTRA 2) W/DEVICE KIT   . [DISCONTINUED] glucose blood test strip Use as instructed  . [DISCONTINUED] Insulin Pen Needle 32G X 4 MM MISC Use as directed with insulin daily for IDDM   No facility-administered encounter medications on file as of 09/15/2019.    Activities of Daily Living In your present state of health, do you have any difficulty performing the following activities: 09/15/2019 08/22/2019  Hearing? N N  Comment declines hearing aids -  Vision? N N  Difficulty concentrating or making decisions? N N  Walking or climbing stairs? N N  Dressing or bathing? N N  Doing errands, shopping? N N  Preparing Food and eating ? N -  Using the Toilet? N -  In the past six months, have you accidently leaked urine? N -  Do you have problems with loss of bowel control? N -  Managing your Medications? N -  Managing your Finances? N -  Housekeeping or managing your Housekeeping? N -  Some recent data might be hidden    Patient Care Team: Delsa Grana, PA-C as PCP - General (Family Medicine) Skeet Latch, MD as Consulting Physician (Cardiology) Lavonia Dana, MD as Consulting Physician (Internal Medicine) Cathi Roan, [facility] as Pharmacist (Pharmacist) Vern Claude, [place] as Social Worker   Assessment:   This is a routine wellness examination for [place].  Exercise Activities and Dietary recommendations Current Exercise Habits: The patient does not participate in regular exercise at present, Exercise limited by: orthopedic condition(s);neurologic condition(s)  Goals     . "I would like to get better control of my diabetes" (pt-stated)     Current Barriers:  . Non Adherence to prescribed medication regimen and Financial Barriers  Pharmacist Clinical Goal(s):  Marland Kitchen Over the  next 30 days, patient will demonstrate improved understanding of prescribed medications and rationale for usage as evidenced by patient report . Over the next 30 days, patient will work with clinical pharmacist to apply to [employer] medication assistance program, facilitated by [employer]   Interventions: . Provided patient materials to apply for [employer] assistance . Reviewed FBGs with patient  Patient Self Care Activities:  . Self administers medications as prescribed . Checks BG as indicated  Initial goal documentation      . DIET - INCREASE WATER INTAKE     Recommend to drink at least 6-8 8oz glasses of water per day.    . I just can't afford my medicine (pt-stated)     Current Barriers:  . Lacks caregiver support.  . Film/video editor.  . Difficulty obtaining medications  Social Work Clinical Goal(s):  Marland Kitchen Over the next 30 days, patient will work with LCSW to address needs related to [facility]  Interventions: . Followed up with patient in regards to completion of the Application for Health Benefits through [facility]  . Confirmed with patient that he has reviewed the packet of information received and feels that he does not qualify for services . Confirmed with patient plan to visit the [facility] office in person to discuss services and benefits  .  Marland Kitchen Patient Self Care Activities:  . Self administers medications as prescribed . Attends all scheduled provider appointments . Calls pharmacy for medication refills . Attends church or other social activities . Performs ADL's independently . Performs IADL's independently . Calls provider office for new concerns or questions  Please see past updates related to this goal by  clicking on the "Past Updates" button in the selected goal         Fall Risk Fall Risk  09/15/2019 08/22/2019 08/17/2019 06/21/2019 05/04/2019  Falls in the past year? '1 1 1 '$ 0 0  Number falls in past yr: 0 1 0 0 0  Comment - - 2-3 month ago getting off bus. - -  Injury with Fall? 0 0 0 0 0  Risk for fall due to : - - - - -  Risk for fall due to: Comment - - - - -  Follow up Falls prevention discussed - - - -   [place]:  Any stairs in or around the home? Yes  If so, do they handrails? Yes   Home free of loose throw rugs in walkways, pet beds, electrical cords, etc? Yes  Adequate lighting in your home to reduce risk of falls? Yes   ASSISTIVE DEVICES UTILIZED TO PREVENT FALLS:  Life alert? No  Use of a cane, walker or w/c? No  Grab bars in the bathroom? Yes  Shower chair or bench in shower? No  Elevated toilet seat or a handicapped toilet? Yes   DME ORDERS:  DME order needed?  No   TIMED UP AND GO:  Was the test performed? No . Telephonic visit.   Education: Fall risk prevention has been discussed.  Intervention(s) required? No    Depression Screen PHQ 2/9 Scores 09/15/2019 08/22/2019 08/17/2019 06/21/2019  PHQ - 2 Score 0 0 0 0  PHQ- 9 Score - 0 0 0    Cognitive Function     6CIT Screen 09/15/2019 04/30/2018  What Year? 0 points 0 points  What month? 0 points 0 points  What time? 0 points 3 points  Count back from 20 0  points 0 points  Months in reverse 4 points 0 points  Repeat phrase 8 points 6 points  Total Score 12 9    Immunization History  Administered Date(s) Administered  . Influenza, High Dose Seasonal PF 05/05/2016, 06/08/2017, 04/30/2018, 04/16/2019  . Influenza,inj,Quad PF,6+ Mos 05/08/2015  . Pneumococcal Conjugate-13 03/20/2014  . Pneumococcal Polysaccharide-23 09/08/2003  . Tdap 05/03/2018    Qualifies for Shingles Vaccine? Yes . Due for Shingrix. Education has been provided regarding the importance of  this vaccine. Pt has been advised to call insurance company to determine out of pocket expense. Advised may also receive vaccine at local pharmacy or Health Dept. Verbalized acceptance and understanding.  Tdap: Up to date  Flu Vaccine: Up to date  Pneumococcal Vaccine: Up to date   Screening Tests Health Maintenance  Topic Date Due  . OPHTHALMOLOGY EXAM  08/12/2019  . FOOT EXAM  11/01/2019  . HEMOGLOBIN A1C  02/20/2020  . TETANUS/TDAP  05/03/2028  . INFLUENZA VACCINE  Completed  . PNA vac Low Risk Adult  Addressed   Cancer Screenings:  Colorectal Screening:  No longer required.   Lung Cancer Screening: (Low Dose CT Chest recommended if Age 75-80 years, 30 pack-year currently smoking OR have quit w/in 15years.) does not qualify.   Additional Screening:  Hepatitis C Screening: no longer required  Vision Screening: Recommended annual ophthalmology exams for early detection of glaucoma and other disorders of the eye. Is the patient up to date with their annual eye exam?  Yes  Who is the provider or what is the name of the office in which the pt attends annual eye exams? [place] Screening: Recommended annual dental exams for proper oral hygiene  Community Resource Referral:  CRR required this visit?  No       Plan:    I have personally reviewed and addressed the Medicare Annual Wellness questionnaire and have noted the following in the patient's chart:  A. Medical and social history B. Use of alcohol, tobacco or illicit drugs  C. Current medications and supplements D. Functional ability and status E.  Nutritional status F.  Physical activity G. Advance directives H. List of other physicians I.  Hospitalizations, surgeries, and ER visits in previous 12 months J.  [place] such as hearing and vision if needed, cognitive and depression L. Referrals and appointments   In addition, I have reviewed and discussed with patient certain  preventive protocols, quality metrics, and best practice recommendations. A written personalized care plan for preventive services as well as general preventive health recommendations were provided to patient.   Signed,  Brian Marker, LPN Nurse Health Advisor   Nurse Notes: phone number provided for [facility] Covid Vaccine information

## 2019-09-15 NOTE — Patient Instructions (Signed)
[name] , Thank you for taking time to come for your Medicare Wellness Visit. I appreciate your ongoing commitment to your health goals. Please review the following plan we discussed and let me know if I can assist you in the future.   Screening recommendations/referrals: Colonoscopy: no longer required Recommended yearly ophthalmology/optometry visit for glaucoma screening and checkup Recommended yearly dental visit for hygiene and checkup  Vaccinations: Influenza vaccine: done 04/16/19 Pneumococcal vaccine: done 03/20/14 Tdap vaccine: done 05/03/18 Shingles vaccine: Shingrix discussed. Please contact your pharmacy for coverage information.   Advanced directives: Please bring a copy of your health care power of attorney and living will to the office at your convenience.  Conditions/risks identified: Recommend increasing physical activity  Next appointment: Please follow up in one year for your Medicare Annual Wellness visit.    Preventive Care 19 Years and Older, Male Preventive care refers to lifestyle choices and visits with your health care provider that can promote health and wellness. What does preventive care include?  A yearly physical exam. This is also called an annual well check.  Dental exams once or twice a year.  Routine eye exams. Ask your health care provider how often you should have your eyes checked.  Personal lifestyle choices, including:  Daily care of your teeth and gums.  Regular physical activity.  Eating a healthy diet.  Avoiding tobacco and drug use.  Limiting alcohol use.  Practicing safe sex.  Taking low doses of aspirin every day.  Taking vitamin and mineral supplements as recommended by your health care provider. What happens during an annual well check? The services and screenings done by your health care provider during your annual well check will depend on your age, overall health, lifestyle risk factors, and family history of  disease. Counseling  Your health care provider may ask you questions about your:  Alcohol use.  Tobacco use.  Drug use.  Emotional well-being.  Home and relationship well-being.  Sexual activity.  Eating habits.  History of falls.  Memory and ability to understand (cognition).  Work and work Statistician. Screening  You may have the following tests or measurements:  Height, weight, and BMI.  Blood pressure.  Lipid and cholesterol levels. These may be checked every 5 years, or more frequently if you are over 59 years old.  Skin check.  Lung cancer screening. You may have this screening every year starting at age 16 if you have a 30-pack-year history of smoking and currently smoke or have quit within the past 15 years.  Fecal occult blood test (FOBT) of the stool. You may have this test every year starting at age 76.  Flexible sigmoidoscopy or colonoscopy. You may have a sigmoidoscopy every 5 years or a colonoscopy every 10 years starting at age 27.  Prostate cancer screening. Recommendations will vary depending on your family history and other risks.  Hepatitis C blood test.  Hepatitis B blood test.  Sexually transmitted disease (STD) testing.  Diabetes screening. This is done by checking your blood sugar (glucose) after you have not eaten for a while (fasting). You may have this done every 1-3 years.  Abdominal aortic aneurysm (AAA) screening. You may need this if you are a current or former smoker.  Osteoporosis. You may be screened starting at age 58 if you are at high risk. Talk with your health care provider about your test results, treatment options, and if necessary, the need for more tests. Vaccines  Your health care provider may recommend certain  vaccines, such as:  Influenza vaccine. This is recommended every year.  Tetanus, diphtheria, and acellular pertussis (Tdap, Td) vaccine. You may need a Td booster every 10 years.  Zoster vaccine. You may  need this after age 63.  Pneumococcal 13-valent conjugate (PCV13) vaccine. One dose is recommended after age 25.  Pneumococcal polysaccharide (PPSV23) vaccine. One dose is recommended after age 22. Talk to your health care provider about which screenings and vaccines you need and how often you need them. This information is not intended to replace advice given to you by your health care provider. Make sure you discuss any questions you have with your health care provider. Document Released: 09/07/2015 Document Revised: 04/30/2016 Document Reviewed: 06/12/2015 Elsevier Interactive Patient Education  2017 [employer] Prevention in the Home Falls can cause injuries. They can happen to people of all ages. There are many things you can do to make your home safe and to help prevent falls. What can I do on the outside of my home?  Regularly fix the edges of walkways and driveways and fix any cracks.  Remove anything that might make you trip as you walk through a door, such as a raised step or threshold.  Trim any bushes or trees on the path to your home.  Use bright outdoor lighting.  Clear any walking paths of anything that might make someone trip, such as rocks or tools.  Regularly check to see if handrails are loose or broken. Make sure that both sides of any steps have handrails.  Any raised decks and porches should have guardrails on the edges.  Have any leaves, snow, or ice cleared regularly.  Use sand or salt on walking paths during winter.  Clean up any spills in your garage right away. This includes oil or grease spills. What can I do in the bathroom?  Use night lights.  Install grab bars by the toilet and in the tub and shower. Do not use towel bars as grab bars.  Use non-skid mats or decals in the tub or shower.  If you need to sit down in the shower, use a plastic, non-slip stool.  Keep the floor dry. Clean up any water that spills on the floor as soon as it  happens.  Remove soap buildup in the tub or shower regularly.  Attach bath mats securely with double-sided non-slip rug tape.  Do not have throw rugs and other things on the floor that can make you trip. What can I do in the bedroom?  Use night lights.  Make sure that you have a light by your bed that is easy to reach.  Do not use any sheets or blankets that are too big for your bed. They should not hang down onto the floor.  Have a firm chair that has side arms. You can use this for support while you get dressed.  Do not have throw rugs and other things on the floor that can make you trip. What can I do in the kitchen?  Clean up any spills right away.  Avoid walking on wet floors.  Keep items that you use a lot in easy-to-reach places.  If you need to reach something above you, use a strong step stool that has a grab bar.  Keep electrical cords out of the way.  Do not use floor polish or wax that makes floors slippery. If you must use wax, use non-skid floor wax.  Do not have throw rugs and other things  on the floor that can make you trip. What can I do with my stairs?  Do not leave any items on the stairs.  Make sure that there are handrails on both sides of the stairs and use them. Fix handrails that are broken or loose. Make sure that handrails are as long as the stairways.  Check any carpeting to make sure that it is firmly attached to the stairs. Fix any carpet that is loose or worn.  Avoid having throw rugs at the top or bottom of the stairs. If you do have throw rugs, attach them to the floor with carpet tape.  Make sure that you have a light switch at the top of the stairs and the bottom of the stairs. If you do not have them, ask someone to add them for you. What else can I do to help prevent falls?  Wear shoes that:  Do not have high heels.  Have rubber bottoms.  Are comfortable and fit you well.  Are closed at the toe. Do not wear sandals.  If you  use a stepladder:  Make sure that it is fully opened. Do not climb a closed stepladder.  Make sure that both sides of the stepladder are locked into place.  Ask someone to hold it for you, if possible.  Clearly mark and make sure that you can see:  Any grab bars or handrails.  First and last steps.  Where the edge of each step is.  Use tools that help you move around (mobility aids) if they are needed. These include:  Canes.  Walkers.  Scooters.  Crutches.  Turn on the lights when you go into a dark area. Replace any light bulbs as soon as they burn out.  Set up your furniture so you have a clear path. Avoid moving your furniture around.  If any of your floors are uneven, fix them.  If there are any pets around you, be aware of where they are.  Review your medicines with your doctor. Some medicines can make you feel dizzy. This can increase your chance of falling. Ask your doctor what other things that you can do to help prevent falls. This information is not intended to replace advice given to you by your health care provider. Make sure you discuss any questions you have with your health care provider. Document Released: 06/07/2009 Document Revised: 01/17/2016 Document Reviewed: 09/15/2014 Elsevier Interactive Patient Education  2017 [employer].

## 2019-09-16 ENCOUNTER — Telehealth: Payer: Self-pay | Admitting: Family Medicine

## 2019-09-16 NOTE — Chronic Care Management (AMB) (Signed)
  Chronic Care Management   Note  09/16/2019 Name: Brian [name] MRN: [id number] DOB: [birth year]  Brian [name] is a 86 y.o. year old male who is a primary care patient of Delsa Grana, [place]. [name] is currently enrolled in care management services. An additional referral for RN Case manager was placed.   Follow up plan: Unsuccessful telephone outreach attempt made. A HIPPA compliant phone message was left for the patient providing contact information and requesting a return call.  The care management team will reach out to the patient again over the next 7 days.  If patient returns call to provider office, please advise to call Embedded Care Management Care Guide Glenna Durand LPN at [phone]  Sharran Caratachea, LPN Health Advisor, [facility] ??[email or web address] ??[phone]

## 2019-09-26 NOTE — Chronic Care Management (AMB) (Signed)
  Chronic Care Management   Note  09/26/2019 Name: Brian [name] MRN: [id number] DOB: December 11, 1933  Brian [name] is a 86 y.o. year old male who is a primary care patient of Delsa Grana, [place]. Brian [name] is currently enrolled in care management services. An additional referral for RN case manager was placed.   Follow up plan:  2nd unsuccessful telephone outreach attempt made. A HIPPA compliant phone message was left for the patient providing contact information and requesting a return call.  The care management team will reach out to the patient again over the next 7 days.  If patient returns call to provider office, please advise to call Embedded Care Management Care Guide Glenna Durand LPN at [phone]  Nickeah Allen, LPN Health Advisor, [facility] Management ??[email or web address] ??[phone]

## 2019-09-28 ENCOUNTER — Other Ambulatory Visit (HOSPITAL_COMMUNITY): Payer: Self-pay | Admitting: Cardiovascular Disease

## 2019-09-28 DIAGNOSIS — I350 Nonrheumatic aortic (valve) stenosis: Secondary | ICD-10-CM

## 2019-09-29 ENCOUNTER — Ambulatory Visit (HOSPITAL_COMMUNITY): Payer: Medicare PPO | Attending: Cardiology

## 2019-09-29 ENCOUNTER — Encounter (INDEPENDENT_AMBULATORY_CARE_PROVIDER_SITE_OTHER): Payer: Self-pay

## 2019-09-29 ENCOUNTER — Other Ambulatory Visit: Payer: Self-pay

## 2019-09-29 ENCOUNTER — Other Ambulatory Visit (HOSPITAL_COMMUNITY): Payer: Medicare PPO

## 2019-09-29 DIAGNOSIS — I350 Nonrheumatic aortic (valve) stenosis: Secondary | ICD-10-CM | POA: Insufficient documentation

## 2019-10-03 NOTE — Chronic Care Management (AMB) (Signed)
  Chronic Care Management   Outreach Note  10/03/2019 Name: [name] MRN: [id number] DOB: 01/12/1934  Brian [name] is a 86 y.o. year old male who is a primary care patient of Delsa Grana, [place]. I reached out to Brian [name] by phone today in response to a referral sent by Mr. [name]'s PCP, Delsa Grana PA     Third unsuccessful telephone outreach was attempted today. The patient was referred to the case management team for assistance with care management and care coordination. The patient's primary care provider has been notified of our unsuccessful attempts to make or maintain contact with the patient. The care management team is pleased to engage with this patient at any time in the future should he/she be interested in assistance from the care management team.   Follow Up Plan: A HIPPA compliant phone message was left for the patient providing contact information and requesting a return call.  The care management team is available to follow up with the patient after provider conversation with the patient regarding recommendation for care management engagement and subsequent re-referral to the care management team.   Glenna Durand, LPN Health Advisor, [facility] Management ??[email or web address] ??[phone]

## 2019-10-06 ENCOUNTER — Ambulatory Visit: Payer: Medicare PPO | Attending: Family

## 2019-10-06 DIAGNOSIS — Z23 Encounter for immunization: Secondary | ICD-10-CM | POA: Insufficient documentation

## 2019-10-06 NOTE — Progress Notes (Signed)
   Covid-19 Vaccination Clinic  Name:  Brian [name]    MRN: [id number] DOB: 08-02-1934  10/06/2019  [name] was observed post Covid-19 immunization for 15 minutes without incidence. He was provided with Vaccine Information Sheet and instruction to access the V-Safe system.   [name] was instructed to call 911 with any severe reactions post vaccine: Marland Kitchen Difficulty breathing  . Swelling of your face and throat  . A fast heartbeat  . A bad rash all over your body  . Dizziness and weakness    Immunizations Administered    Name Date Dose VIS Date Route   Moderna COVID-19 Vaccine 10/06/2019  4:13 PM 0.5 mL 07/26/2019 Intramuscular   Manufacturer: Moderna   Lot: [id number]   [place]:[id number]

## 2019-10-31 ENCOUNTER — Telehealth: Payer: Self-pay | Admitting: Cardiovascular Disease

## 2019-10-31 ENCOUNTER — Ambulatory Visit: Payer: Medicare PPO | Admitting: Cardiovascular Disease

## 2019-10-31 ENCOUNTER — Encounter: Payer: Self-pay | Admitting: Cardiovascular Disease

## 2019-10-31 ENCOUNTER — Other Ambulatory Visit: Payer: Self-pay

## 2019-10-31 VITALS — BP 122/68 | HR 86 | Ht 70.0 in | Wt 150.6 lb

## 2019-10-31 DIAGNOSIS — I5189 Other ill-defined heart diseases: Secondary | ICD-10-CM

## 2019-10-31 DIAGNOSIS — E785 Hyperlipidemia, unspecified: Secondary | ICD-10-CM | POA: Diagnosis not present

## 2019-10-31 DIAGNOSIS — I350 Nonrheumatic aortic (valve) stenosis: Secondary | ICD-10-CM

## 2019-10-31 DIAGNOSIS — N529 Male erectile dysfunction, unspecified: Secondary | ICD-10-CM | POA: Diagnosis not present

## 2019-10-31 NOTE — Telephone Encounter (Signed)
   Pt's wife called and would like to receive a callback from Dr. Oval Linsey or her nurse. She said her husband couldn't remember most of the info from his appt today and she has some questions.  Please call

## 2019-10-31 NOTE — Patient Instructions (Addendum)
Medication Instructions:  STOP SILDENAFIL   *If you need a refill on your cardiac medications before your next appointment, please call your pharmacy*  Lab Work: NONE If you have labs (blood work) drawn today and your tests are completely normal, you will receive your results only by: Marland Kitchen MyChart Message (if you have MyChart) OR . A paper copy in the mail If you have any lab test that is abnormal or we need to change your treatment, we will call you to review the results.  Testing/Procedures: NONE  Follow-Up: At [facility], you and your health needs are our priority.  As part of our continuing mission to provide you with exceptional heart care, we have created designated Provider Care Teams.  These Care Teams include your primary Cardiologist (physician) and Advanced Practice Providers (APPs -  Physician Assistants and Nurse Practitioners) who all work together to provide you with the care you need, when you need it.  We recommend signing up for the patient portal called "MyChart".  Sign up information is provided on this After Visit Summary.  MyChart is used to connect with patients for Virtual Visits (Telemedicine).  Patients are able to view lab/test results, encounter notes, upcoming appointments, etc.  Non-urgent messages can be sent to your provider as well.   To learn more about what you can do with MyChart, go to [email or web address].    Your next appointment:   2 month(s)  The format for your next appointment:   In Person  Provider:   You may see Dr Oval Linsey or one of the following Advanced Practice Providers on your designated Care Team:    Kerin Ransom, PA-C  [place], [place]  Coletta Memos, [place]  You have been referred to Dr Angelena Form or Dr Burt Knack for TAVR discussion. The office should be in touch with you soon. If you do not hear from them in 1 week call to follow up.   Other Instructions  Transcatheter Aortic Valve Replacement  Transcatheter aortic valve  replacement (TAVR) is a procedure to place an artificial aortic valve in the heart. The aortic valve controls blood flow between the heart and the main blood vessel that supplies blood to the body (aorta). During TAVR, a flexible tube (catheter) is placed through an incision in the groin, chest, or neck and is guided to the aortic valve. Then, the artificial valve is moved through the catheter and into the aortic valve, where it is expanded. The new valve takes over the function of the old valve, and it improves blood flow through the heart. This procedure is also called transcatheter aortic valve implantation (TAVI). You may need TAVR if you have a stiff aortic valve (aortic stenosis) that is causing symptoms and open heart valve replacement surgery is not an option for you. Tell a health care provider about:  Any allergies you have.  All medicines you are taking, including vitamins, herbs, eye drops, creams, and over-the-counter medicines.  Any problems you or family members have had with anesthetic medicines.  Any blood disorders you have.  Any surgeries you have had.  Any medical conditions you have.  Whether you are pregnant or may be pregnant. What are the risks? Generally, this is a safe procedure. However, problems may occur, including:  Bleeding.  Damage to blood vessels or the heart.  Stroke.  Blood clots.  Abnormal heart rhythms (arrhythmia).  Heart attack.  Allergic reactions to medicines or dyes.  Infection.  Kidney failure.  Failure of the artificial valve. What  happens before the procedure? Medicines  Ask your health care provider about: ? Changing or stopping your regular medicines. This is especially important if you are taking diabetes medicines or blood thinners. ? Taking medicines such as aspirin and ibuprofen. These medicines can thin your blood. Do not take these medicines before your procedure if your health care provider instructs you not to.  You  may be given antibiotic medicine to help prevent infection.  You may need to take blood thinners before surgery. If so, take these medicines as directed. Staying hydrated Follow instructions from your health care provider about hydration, which may include:  Up to 2 hours before the procedure - you may continue to drink clear liquids, such as water, clear fruit juice, black coffee, and plain tea. Eating and drinking restrictions Follow instructions from your health care provider about eating and drinking, which may include:  8 hours before the procedure - stop eating heavy meals or foods such as meat, fried foods, or fatty foods.  6 hours before the procedure - stop eating light meals or foods, such as toast or cereal.  6 hours before the procedure - stop drinking milk or drinks that contain milk.  2 hours before the procedure - stop drinking clear liquids. General instructions  Do not use any products that contain nicotine or tobacco for as long as possible before your procedure. These include cigarettes and e-cigarettes. If you need help quitting, ask your health care provider.  You may have a blood or urine sample taken.  You may have tests, such as: ? Echocardiogram. This is an ultrasound scan of the heart. ? CT scan. ? Cardiac catheterization. During this procedure, a catheter is inserted into a blood vessel and guided into your heart to check pressures and take images of the heart. ? Lung function tests.  Plan to have someone take you home from the hospital. What happens during the procedure?  To lower your risk of infection: ? Your health care team will wash or sanitize their hands. ? Your skin will be washed with soap.  IV tubes will be inserted into veins in your arms.  You will be given one or more of the following: ? A medicine to help you relax (sedative). ? A medicine to numb the area (local anesthetic). ? A medicine to make you fall asleep (general  anesthetic).  Small incisions will be made in one or more of the following places: ? Your groin. This is the most common. ? Your neck. ? In your chest, over your breastbone (sternum). ? The left side of your chest.  Catheters will be threaded into your incisions and into blood vessels that lead to your heart. If your incision is in the left side of your chest, a catheter will be placed directly into your heart. Catheters will be used to monitor your heartbeat and regulate it if necessary (pacemaker).  Ongoing X-ray images (fluoroscopy) will be used to guide the catheters to the right places in your heart.  A wire may be placed through the catheter inside your aortic valve. A balloon at the end of this catheter may be inflated to open your aortic valve. This wire will then be removed.  Another wire will be placed through the catheter inside your aortic valve. The wire will be used to help position and inflate your artificial valve. Then, the wire will be removed.  Tests will be done to make sure your new valve is working and your heart is  functioning properly.  Your incisions will be closed with stitches (sutures), skin glue, or adhesive strips.  If you had an incision in your chest wall, you may have a chest tube placed to keep your lung from collapsing.  Bandages (dressings) will be placed over your incisions. The procedure may vary among health care providers and hospitals. What happens after the procedure?  Your blood pressure, heart rate, breathing rate, and blood oxygen level will be monitored.  You may have to wear compression stockings. These stockings help to prevent blood clots and reduce swelling in your legs.  You may need to lie still in bed for several hours. Once you are allowed to get up, you will be encouraged to move around.  If you had a chest incision, you will have some chest pain. You will be given pain medicine as needed.  You may continue to receive fluids and  medicines through an IV tube.  You will be given blood thinners.  You will be shown how to do breathing exercises to prevent lung infection.  You may continue to have a chest tube draining fluid from the surgical area.  Do not drive for 24 hours if you were given a sedative. Summary  Transcatheter aortic valve replacement (TAVR) is a procedure to place an artificial aortic valve in the heart.  You may need TAVR if you have a stiff aortic valve (aortic stenosis) that is causing symptoms and open heart valve replacement surgery is not an option for you.  After the procedure, you may need to lie still in bed for several hours. Once you are allowed to get up, you will be encouraged to move around. This information is not intended to replace advice given to you by your health care provider. Make sure you discuss any questions you have with your health care provider. Document Revised: 12/01/2018 Document Reviewed: 06/30/2016 Elsevier Patient Education  [place].

## 2019-10-31 NOTE — Telephone Encounter (Signed)
No vm set up, will try again

## 2019-10-31 NOTE — Progress Notes (Signed)
Cardiology Office Note   Date:  10/31/2019   ID:  Brian [name], DOB 03-21-1934, MRN [id number]  PCP:  Delsa Grana, PA-C  Cardiologist:   Skeet Latch, MD   No chief complaint on file.   History of Present Illness: Brian [name] is a 86 y.o. male with severe aortic stenosis, hypertension, diabetes mellitus, CKD 3, and hyperlipidemia who presents for follow up.  Mr. Spadaro was admitted to the hospital 06/2015 with liver abscesses and sepsis. During that hospitalization he had demand ischemia with a peak troponin of 1.08.  EKG was negative for ischemia and his echo revealed normal systolic function, grade 1 diastolic dysfunction and mild aortic stenosis.  He followed up with cardiology following that hospitalization and was doing well.  He underwent exercise Cardiolite on 09/2015 that was negative for ischemia. He has been intolerant to statins in the past and was tried on pravastatin. At a follow-up appointment with his primary care physician who is noted to be bradycardic to 45 bpm. Carvedilol was held and he remained asymptomatic.  Since his last appointment he was noted to be in ventricular bigeminy at a PCP appointment.  He was referred to EP given his history of bradycardia on beta blockers.  A 48 hour Holter 08/2018 showed occasional PAC/PVCs but no atrial fibrillation.   Since his last appointment he had a repeat echo that showed an increase in his aortic valve gradient.  He now has severe aortic stenosis.  He reports that he has been feeling well.  He has not experienced any chest pain and his breathing has been stable.  He denies any lower extremity edema, orthopnea, or PND.  However he does not get much exercise.  He reports that he walks around his house but does not get any other formal exercise.  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  He has been evaluated by orthopedics and will be starting some physical therapy soon.  He attributes this to arthritis and old age.   He does have a prescription for sildenafil but reports that he is not using it because he cannot afford it.   Past Medical History:  Diagnosis Date  . Anemia of chronic renal failure 05/08/2015  . Anemia of chronic renal failure 05/08/2015  . Aortic stenosis, mild 09/21/2015  . Aortic stenosis, severe 09/21/2015  . Arthritis   . Bladder stone   . Chronic kidney disease (CKD), stage III (moderate) 05/08/2015   Followed by Dr. Rolly Salter   . Congestive heart failure ([place]) 03/14/2019  . Diastolic dysfunction without heart failure 06/08/2017  . Essential hypertension 05/08/2015  . History of bladder stone   . Hyperlipidemia   . Hypertension   . Nocturia   . Renal disorder   . Type 2 diabetes mellitus ([place])     Past Surgical History:  Procedure Laterality Date  . CYSTOSCOPY/RETROGRADE/URETEROSCOPY/STONE EXTRACTION WITH BASKET N/A 03/22/2013   Procedure: CYSTOSCOPY BLADDER STONE STONE EXTRACTION;  Surgeon: [name], MD;  Location: [place];  Service: Urology;  Laterality: N/A;  CYSTOSCOPY    . TONSILLECTOMY       Current Outpatient Medications  Medication Sig Dispense Refill  . ACCU-CHEK GUIDE test strip USE UP TO 4 TIMES A DAY AS DIRECTED 100 strip 0  . Accu-Chek Softclix Lancets lancets     . amLODipine (NORVASC) 10 MG tablet TAKE 1 TABLET (10 MG TOTAL) BY MOUTH DAILY. (FOR HIGH BLOOD PRESSURE) 90 tablet 2  . aspirin EC 81  MG tablet Take 81 mg by mouth daily. Reported on 09/17/2015    . COMBIGAN 0.2-0.5 % ophthalmic solution Apply 1 drop to eye 2 (two) times daily.    Marland Kitchen docusate sodium (DOK) 100 MG capsule Take 1 capsule (100 mg total) by mouth 2 (two) times daily as needed. 100 capsule 5  . finasteride (PROSCAR) 5 MG tablet Take 1 tablet (5 mg total) by mouth daily. 90 tablet 3  . Insulin Detemir (LEVEMIR FLEXTOUCH) 100 UNIT/ML Pen Inject 10-20 Units into the skin daily. 15 mL 1  . Insulin Pen Needle (B-D UF III MINI PEN NEEDLES) 31G X 5 MM MISC USE AS DIRECTED  WITH LEVEMIR INSULIN PENS AT BEDTIME, once a day 100 each 3  . Omega-3 Fatty Acids (FISH OIL) 1000 MG CAPS Take 1 capsule (1,000 mg total) by mouth daily. 90 capsule 3  . pregabalin (LYRICA) 25 MG capsule Take 1 capsule (25 mg total) by mouth 2 (two) times daily. For pain 60 capsule 1  . quinapril (ACCUPRIL) 20 MG tablet TAKE 1 TABLET BY MOUTH TWICE A DAY 180 tablet 2  . rosuvastatin (CRESTOR) 5 MG tablet TAKE ONE TAB BY MOUTH FOUR NIGHTS A WEEK 52 tablet 1  . tamsulosin (FLOMAX) 0.4 MG CAPS capsule TAKE 1 CAPSULE BY MOUTH EVERY DAY 90 capsule 1   No current facility-administered medications for this visit.    Allergies:   Carvedilol, Niaspan [niacin er], Shellfish allergy, Statins, Sulfa antibiotics, Zetia [ezetimibe], and Januvia [sitagliptin]    Social History:  The patient  reports that he quit smoking about 26 years ago. His smoking use included cigarettes. He has a 29.25 pack-year smoking history. He has never used smokeless tobacco. He reports that he does not drink alcohol or use drugs.   Family History:  The patient's family history includes Congestive Heart Failure in an other family member; Diabetes in his brother; Heart attack in an other family member; Heart disease in an other family member.    ROS:  Please see the history of present illness.   Otherwise, review of systems are positive for weight loss.   All other systems are reviewed and negative.    PHYSICAL EXAM: VS:  BP 122/68   Pulse 86   Ht 5\' 10"  (1.778 m)   Wt 150 lb 9.6 oz (68.3 kg)   SpO2 99%   BMI 21.61 kg/m  , BMI Body mass index is 21.61 kg/m. GENERAL:  Well appearing HEENT: Pupils equal round and reactive, fundi not visualized, oral mucosa unremarkable NECK:  No jugular venous distention, waveform within normal limits, carotid upstroke brisk and symmetric, no bruits, no thyromegaly LYMPHATICS:  No cervical adenopathy LUNGS:  Clear to auscultation bilaterally HEART:  RRR.  PMI not displaced or  sustained,S1 and S2 within normal limits, no S3, no S4, no clicks, no rubs, III/VI late-peaking systolic murmur ABD:  Flat, positive bowel sounds normal in frequency in pitch, no bruits, no rebound, no guarding, no midline pulsatile mass, no hepatomegaly, no splenomegaly EXT:  2 plus pulses throughout, no edema, no cyanosis no clubbing SKIN:  No rashes no nodules NEURO:  Cranial nerves II through XII grossly intact, motor grossly intact throughout PSYCH:  Cognitively intact, oriented to person place and time   EKG:  EKG is ordered today. 12/04/17: Sinus rhythm.  Rate 82 bmp.   10/31/19: Sinus rhythm.  Rate 86 bpm.  Exercise Cardiolite 09/28/15:  Nuclear stress EF: 51%.  The left ventricular ejection fraction is mildly decreased (45-54%).  There was no ST segment deviation noted during stress.  The study is normal.  Normal stress nuclear study with no ischemia or infarction; low normal LV function with EF 51 and normal wall motion.  48 hour Holter 09/16/18: Minimum HR: 51 BPM at 6:27:33 AM Maximum HR: 120 BPM at 11:59:29 PM Average HR: 73 BPM Rare PVCs and PACs Zero atrial fibrillation One episode of ventricular bigeminy No other arrhythmias noted No symptoms submitted  Echo 09/29/19: IMPRESSIONS  1. Left ventricular ejection fraction, by visual estimation, is 65 to  70%. The left ventricle has hyperdynamic function. There is moderately  increased left ventricular hypertrophy.  2. Elevated left atrial pressure.  3. Left ventricular diastolic parameters are consistent with Grade I  diastolic dysfunction (impaired relaxation).  4. The left ventricle has no regional wall motion abnormalities.  5. Global right ventricle has normal systolic function.The right  ventricular size is normal. No increase in right ventricular wall  thickness.  6. Left atrial size was normal.  7. Right atrial size was normal.  8. The mitral valve is normal in structure. Mild mitral valve   regurgitation. No evidence of mitral stenosis.  9. The tricuspid valve is normal in structure.  10. The tricuspid valve is normal in structure. Tricuspid valve  regurgitation is mild.  11. Aortic valve mean gradient measures 37.0 mmHg.  12. The aortic valve is normal in structure. Aortic valve regurgitation is  not visualized. Severe aortic valve stenosis.  13. There is severe calcifcation of the aortic valve.  14. There is severe thickening of the aortic valve.  15. The pulmonic valve was normal in structure. Pulmonic valve  regurgitation is not visualized.  16. Normal pulmonary artery systolic pressure.  17. The tricuspid regurgitant velocity is 2.33 m/s, and with an assumed  right atrial pressure of 3 mmHg, the estimated right ventricular systolic  pressure is normal at 24.7 mmHg.  18. The inferior vena cava is normal in size with greater than 50%  respiratory variability, suggesting right atrial pressure of 3 mmHg.  19. Severe aortic stenosis, peak/mean transaortic gradients 58/37 mmHg,  dimensionless index 0.26.   Recent Labs: 08/22/2019: ALT 14; BUN 22; Creat 1.34; Hemoglobin 12.7; Platelets 190; Potassium 4.6; Sodium 141; TSH 1.71    Lipid Panel    Component Value Date/Time   CHOL 184 08/22/2019 0000   TRIG 79 08/22/2019 0000   HDL 68 08/22/2019 0000   CHOLHDL 2.7 08/22/2019 0000   VLDL 17 02/27/2017 0844   LDLCALC 99 08/22/2019 0000      Wt Readings from Last 3 Encounters:  10/31/19 150 lb 9.6 oz (68.3 kg)  09/15/19 167 lb (75.8 kg)  08/22/19 167 lb 3.2 oz (75.8 kg)      ASSESSMENT AND PLAN:  # Severe aortic stenosis: Mean gradient 37 mmHg.  Dimensionless index was 0.26.  His echo was personally reviewed.  Echo seems most consistent with moderate to severe aortic stenosis.  His leaflets are certainly very restricted and he is on the severe and is moderate.  S2 is intact on exam.  He is not currently symptomatic.  However there has been a significant increase in  his mean gradient in the last 2 years.  I think it is very reasonable to consider aortic valve replacement.  We will refer him to the structural heart disease clinic for consideration.  Given his age would likely be a good candidate for TAVR.  He was advised to stop using sildenafil in order to avoid  hypotension, and he reports that he was not using it anyways.  # PVCs:  # Bradycardia: No atrial fibrillation noted on 48-hour Holter 08/2018.  Beta blocker was stopped 2/2 bradycardia.   # Hypertension:  Blood pressure is well-controlled.  Continue amlodipine and quinapril.  # Hyperlipidemia: LDL 106.  He is tolerating rosuvastatin three times per week.    Current medicines are reviewed at length with the patient today.  The patient does not have concerns regarding medicines.  The following changes have been made: none Labs/ tests ordered today include:   No orders of the defined types were placed in this encounter.    Disposition:   FU with Detria Cummings C. Oval Linsey, MD, [facility] in 2 months.     Signed, Aser Nylund C. Oval Linsey, MD, [facility]  10/31/2019 11:25 AM    [place]

## 2019-11-01 ENCOUNTER — Encounter: Payer: Self-pay | Admitting: Family Medicine

## 2019-11-01 NOTE — Telephone Encounter (Signed)
Is it ok for Mr. Tamashiro to have therapy on his shoulder this Friday and can he take his COVID shot next Tuesday?  Pleas call Loletha Carrow (wife ) at [phone]

## 2019-11-01 NOTE — Telephone Encounter (Signed)
Patient having therapy on shoulder Friday Advised ok for therapy and COVID vaccine

## 2019-11-03 ENCOUNTER — Other Ambulatory Visit: Payer: Self-pay | Admitting: Family Medicine

## 2019-11-07 ENCOUNTER — Ambulatory Visit: Payer: Medicare PPO | Admitting: Cardiovascular Disease

## 2019-11-07 ENCOUNTER — Other Ambulatory Visit: Payer: Self-pay

## 2019-11-07 ENCOUNTER — Encounter: Payer: Self-pay | Admitting: Cardiovascular Disease

## 2019-11-07 VITALS — BP 108/48 | HR 108 | Ht 71.0 in | Wt 151.0 lb

## 2019-11-07 DIAGNOSIS — I350 Nonrheumatic aortic (valve) stenosis: Secondary | ICD-10-CM

## 2019-11-07 NOTE — Patient Instructions (Signed)
Medication Instructions:  Your physician recommends that you continue on your current medications as directed. Please refer to the Current Medication list given to you today. *If you need a refill on your cardiac medications before your next appointment, please call your pharmacy*   Lab Work: None ordered  If you have labs (blood work) drawn today and your tests are completely normal, you will receive your results only by: Marland Kitchen MyChart Message (if you have MyChart) OR . A paper copy in the mail If you have any lab test that is abnormal or we need to change your treatment, we will call you to review the results.   Testing/Procedures: None ordered   Follow-Up: Follow up with Dr. Angelena Form on 12/22/19 at 9:30 AM

## 2019-11-07 NOTE — Progress Notes (Signed)
[facility] Consult Note  Chief Complaint  Patient presents with  . New Patient (Initial Visit)    severe aortic stenosis    History of Present Illness: 86 yo Brian [name] with history of anemia, stage 3 chronic kidney disease, chronic diastolic CHF, diabetes mellitus, HTN, HLD and moderately severe aortic stenosis who is here today as a new consult, referred by Dr. Oval Linsey, for further discussion regarding her aortic stenosis and possible TAVR. He has a history of bradycardia and has been off of beta blockers. He has a history of PVCs. His moderate aortic stenosis has been followed for the past several years. Echo 09/29/19 with LVEF=65-70%, moderate LVH, grade 1 diastolic dysfunction, mild MR. The aortic valve leaflets are thickened and calcified with limited leaflet mobility. Mean gradient 37 mmHg, peak gradient 58 mmHg, dimensionless index 0.26, AVA 1.22 cm2. This is consistent with moderate to severe aortic stenosis.   He tells me today that he has been feeling well. He has no dyspnea, chest pain, dizziness, near syncope or syncope. No lower extremity edema. He has lost almost 20 pounds since December due to no appetite given his severe shoulder pain.  He lives with his wife. He has dentures and a bridge with several of his own teeth. No active dental issues. He is a retired from [employer] and served in the [employer].   Primary Care Physician: Delsa Grana, PA-C Primary Cardiologist: Skeet Latch Referring Cardiologist: Skeet Latch  Past Medical History:  Diagnosis Date  . Anemia of chronic renal failure 05/08/2015  . Anemia of chronic renal failure 05/08/2015  . Aortic stenosis, mild 09/21/2015  . Aortic stenosis, severe 09/21/2015  . Arthritis   . Bladder stone   . Chronic kidney disease (CKD), stage III (moderate) 05/08/2015   Followed by Dr. Rolly Salter   . Congestive heart failure ([place]) 03/14/2019  . Diastolic dysfunction without heart failure 06/08/2017  . Essential  hypertension 05/08/2015  . History of bladder stone   . Hyperlipidemia   . Hypertension   . Nocturia   . Renal disorder   . Type 2 diabetes mellitus ([place])     Past Surgical History:  Procedure Laterality Date  . CYSTOSCOPY/RETROGRADE/URETEROSCOPY/STONE EXTRACTION WITH BASKET N/A 03/22/2013   Procedure: CYSTOSCOPY BLADDER STONE STONE EXTRACTION;  Surgeon: [name], MD;  Location: [place];  Service: Urology;  Laterality: N/A;  CYSTOSCOPY    . TONSILLECTOMY      Current Outpatient Medications  Medication Sig Dispense Refill  . ACCU-CHEK GUIDE test strip USE UP TO 4 TIMES A DAY AS DIRECTED 100 strip 0  . Accu-Chek Softclix Lancets lancets     . amLODipine (NORVASC) 10 MG tablet TAKE 1 TABLET (10 MG TOTAL) BY MOUTH DAILY. (FOR HIGH BLOOD PRESSURE) 90 tablet 2  . aspirin EC 81 MG tablet Take 81 mg by mouth daily. Reported on 09/17/2015    . COMBIGAN 0.2-0.5 % ophthalmic solution Apply 1 drop to eye 2 (two) times daily.    Marland Kitchen docusate sodium (DOK) 100 MG capsule Take 1 capsule (100 mg total) by mouth 2 (two) times daily as needed. 100 capsule 5  . finasteride (PROSCAR) 5 MG tablet Take 1 tablet (5 mg total) by mouth daily. 90 tablet 3  . Insulin Detemir (LEVEMIR FLEXTOUCH) 100 UNIT/ML Pen Inject 10-20 Units into the skin daily. 15 mL 1  . Insulin Pen Needle (B-D UF III MINI PEN NEEDLES) 31G X 5 MM MISC USE AS DIRECTED WITH LEVEMIR INSULIN PENS AT  BEDTIME, once a day 100 each 3  . Omega-3 Fatty Acids (FISH OIL) 1000 MG CAPS Take 1 capsule (1,000 mg total) by mouth daily. 90 capsule 3  . quinapril (ACCUPRIL) 20 MG tablet TAKE 1 TABLET BY MOUTH TWICE A DAY 180 tablet 2  . rosuvastatin (CRESTOR) 5 MG tablet TAKE ONE TAB BY MOUTH FOUR NIGHTS A WEEK Brian tablet 1  . tamsulosin (FLOMAX) 0.4 MG CAPS capsule TAKE 1 CAPSULE BY MOUTH EVERY DAY 30 capsule 5   No current facility-administered medications for this visit.    Allergies  Allergen Reactions  . Carvedilol Other (See  Comments)    bradycardia  . Niaspan [Niacin Er]   . Shellfish Allergy Other (See Comments)    Pt vomits  . Statins Other (See Comments)    Myalgia  . Sulfa Antibiotics   . Zetia [Ezetimibe]   . Januvia [Sitagliptin] Rash    Social History   Socioeconomic History  . Marital status: Married    Spouse name: [name]  . Number of children: 4  . Years of education: some college  . Highest education level: 12th grade  Occupational History  . Occupation: Engineer, manufacturing systems  Tobacco Use  . Smoking status: Former Smoker    Packs/day: 0.75    Years: 39.00    Pack years: 29.25    Types: Cigarettes    Quit date: 03/21/1993    Years since quitting: 26.6  . Smokeless tobacco: Never Used  . Tobacco comment: smoking cessation materials not required  Substance and Sexual Activity  . Alcohol use: No    Alcohol/week: 0.0 standard drinks  . Drug use: No  . Sexual activity: Yes  Other Topics Concern  . Not on file  Social History Narrative   Lives with wife in [place], [place].   Social Determinants of Health   Financial Resource Strain: Low Risk   . Difficulty of Paying Living Expenses: Not very hard  Food Insecurity: No Food Insecurity  . Worried About Charity fundraiser in the Last Year: Never true  . Ran Out of Food in the Last Year: Never true  Transportation Needs: No Transportation Needs  . Lack of Transportation (Medical): No  . Lack of Transportation (Non-Medical): No  Physical Activity: Inactive  . Days of Exercise per Week: 0 days  . Minutes of Exercise per Session: 0 min  Stress: No Stress Concern Present  . Feeling of Stress : Not at all  Social Connections: Slightly Isolated  . Frequency of Communication with Friends and Family: More than three times a week  . Frequency of Social Gatherings with Friends and Family: More than three times a week  . Attends Religious Services: More than 4 times per year  . Active Member of Clubs or Organizations: No  . Attends English as a second language teacher Meetings: Never  . Marital Status: Married  Human resources officer Violence: Not At Risk  . Fear of Current or Ex-Partner: No  . Emotionally Abused: No  . Physically Abused: No  . Sexually Abused: No    Family History  Problem Relation Age of Onset  . Hyperlipidemia Father   . Congestive Heart Failure Other   . Heart attack Other   . Heart disease Other   . Diabetes Brother     Review of Systems:  As stated in the HPI and otherwise negative.   BP (!) 108/48   Pulse (!) 108   Ht 5\' 11"  (1.803 m)   Wt 151 lb (68.5 kg)  SpO2 97%   BMI 21.06 kg/m   Physical Examination: General: Well developed, well nourished, NAD  HEENT: OP clear, mucus membranes moist  SKIN: warm, dry. No rashes. Neuro: No focal deficits  Musculoskeletal: Muscle strength 5/5 all ext  Psychiatric: Mood and affect normal  Neck: No JVD, no carotid bruits, no thyromegaly, no lymphadenopathy.  Lungs:Clear bilaterally, no wheezes, rhonci, crackles Cardiovascular: Regular rate and rhythm. Loud, harsh, late peaking systolic murmur.  Abdomen:Soft. Bowel sounds present. Non-tender.  Extremities: No lower extremity edema. Pulses are 2 + in the bilateral DP/PT.  EKG:  EKG is not ordered today. The ekg ordered today demonstrates   Echo 09/29/19: 1. Left ventricular ejection fraction, by visual estimation, is 65 to  70%. The left ventricle has hyperdynamic function. There is moderately  increased left ventricular hypertrophy.  2. Elevated left atrial pressure.  3. Left ventricular diastolic parameters are consistent with Grade I  diastolic dysfunction (impaired relaxation).  4. The left ventricle has no regional wall motion abnormalities.  5. Global right ventricle has normal systolic function.The right  ventricular size is normal. No increase in right ventricular wall  thickness.  6. Left atrial size was normal.  7. Right atrial size was normal.  8. The mitral valve is normal in structure.  Mild mitral valve  regurgitation. No evidence of mitral stenosis.  9. The tricuspid valve is normal in structure.  10. The tricuspid valve is normal in structure. Tricuspid valve  regurgitation is mild.  11. Aortic valve mean gradient measures 37.0 mmHg.  12. The aortic valve is normal in structure. Aortic valve regurgitation is  not visualized. Severe aortic valve stenosis.  13. There is severe calcifcation of the aortic valve.  14. There is severe thickening of the aortic valve.  15. The pulmonic valve was normal in structure. Pulmonic valve  regurgitation is not visualized.  16. Normal pulmonary artery systolic pressure.  17. The tricuspid regurgitant velocity is 2.33 m/s, and with an assumed  right atrial pressure of 3 mmHg, the estimated right ventricular systolic  pressure is normal at 24.7 mmHg.  18. The inferior vena cava is normal in size with greater than 50%  respiratory variability, suggesting right atrial pressure of 3 mmHg.  19. Severe aortic stenosis, peak/mean transaortic gradients 58/37 mmHg,  dimensionless index 0.26.   FINDINGS  Left Ventricle: Left ventricular ejection fraction, by visual estimation,  is 65 to 70%. The left ventricle has hyperdynamic function. The left  ventricle has no regional wall motion abnormalities. There is moderately  increased left ventricular  hypertrophy. Concentric left ventricular hypertrophy. Left ventricular  diastolic parameters are consistent with Grade I diastolic dysfunction  (impaired relaxation). Elevated left atrial pressure.   Right Ventricle: The right ventricular size is normal. No increase in  right ventricular wall thickness. Global RV systolic function is has  normal systolic function. The tricuspid regurgitant velocity is 2.33 m/s,  and with an assumed right atrial pressure  of 3 mmHg, the estimated right ventricular systolic pressure is normal at  24.7 mmHg.   Left Atrium: Left atrial size was normal in size.    Right Atrium: Right atrial size was normal in size   Pericardium: There is no evidence of pericardial effusion.   Mitral Valve: The mitral valve is normal in structure. Mild mitral valve  regurgitation. No evidence of mitral valve stenosis by observation.   Tricuspid Valve: The tricuspid valve is normal in structure. Tricuspid  valve regurgitation is mild.   Aortic Valve: The  aortic valve is normal in structure.. There is severe  thickening and severe calcifcation of the aortic valve. Aortic valve  regurgitation is not visualized. Severe aortic stenosis is present. There  is severe thickening of the aortic  valve. There is severe calcifcation of the aortic valve. Aortic valve mean  gradient measures 37.0 mmHg. Aortic valve peak gradient measures 42.8  mmHg. Aortic valve area, by VTI measures 1.22 cm.   Pulmonic Valve: The pulmonic valve was normal in structure. Pulmonic valve  regurgitation is not visualized. Pulmonic regurgitation is not visualized.   Aorta: The aortic root, ascending aorta and aortic arch are all  structurally normal, with no evidence of dilitation or obstruction.   Venous: The inferior vena cava is normal in size with greater than 50%  respiratory variability, suggesting right atrial pressure of 3 mmHg.   IAS/Shunts: No atrial level shunt detected by color flow Doppler. There is  no evidence of a patent foramen ovale. No ventricular septal defect is  seen or detected. There is no evidence of an atrial septal defect.     LEFT VENTRICLE  PLAX 2D  LVIDd:     4.20 cm Diastology  LVIDs:     2.60 cm LV e' lateral:  7.21 cm/s  LV PW:     1.40 cm LV E/e' lateral: 11.8  LV IVS:    1.10 cm LV e' medial:  6.34 cm/s  LVOT diam:   2.20 cm LV E/e' medial: 13.4  LV SV:     54 ml  LV SV Index:  28.16  LVOT Area:   3.80 cm     RIGHT VENTRICLE  RV Basal diam: 3.90 cm  RV S prime:   24.40 cm/s  TAPSE (M-mode): 2.5 cm    LEFT ATRIUM      Index    RIGHT ATRIUM      Index  LA diam:   3.80 cm 2.01 cm/m RA Area:   11.60 cm  LA Vol (A2C): 35.2 ml 18.60 ml/m RA Volume:  27.50 ml 14.53 ml/m  LA Vol (A4C): 40.5 ml 21.39 ml/m  AORTIC VALVE  AV Area (Vmax):  1.23 cm  AV Area (Vmean):  1.27 cm  AV Area (VTI):   1.22 cm  AV Vmax:      327.20 cm/s  AV Vmean:     244.000 cm/s  AV VTI:      0.631 m  AV Peak Grad:   42.8 mmHg  AV Mean Grad:   37.0 mmHg  LVOT Vmax:     105.45 cm/s  LVOT Vmean:    81.500 cm/s  LVOT VTI:     0.202 m  LVOT/AV VTI ratio: 0.32    AORTA  Ao Root diam: 4.00 cm  Ao Asc diam: 3.70 cm   MITRAL VALVE             TRICUSPID VALVE  MV Area (PHT): 2.42 cm       TR Peak grad:  21.7 mmHg  MV PHT:    90.77 msec      TR Vmax:    233.00 cm/s  MV Decel Time: 313 msec  MV E velocity: 84.80 cm/s 103 cm/s SHUNTS  MV A velocity: 110.00 cm/s 70.3 cm/s Systemic VTI: 0.20 m  MV E/A ratio: 0.77    1.5    Systemic Diam: 2.20 cm   Recent Labs: 08/22/2019: ALT 14; BUN 22; Creat 1.34; Hemoglobin 12.7; Platelets 190; Potassium 4.6; Sodium 141; TSH 1.71   Lipid  Panel    Component Value Date/Time   CHOL 184 08/22/2019 0000   TRIG 79 08/22/2019 0000   HDL 68 08/22/2019 0000   CHOLHDL 2.7 08/22/2019 0000   VLDL 17 02/27/2017 0844   LDLCALC 99 08/22/2019 0000     Wt Readings from Last 3 Encounters:  11/07/19 151 lb (68.5 kg)  10/31/19 150 lb 9.6 oz (68.3 kg)  09/15/19 167 lb (75.8 kg)     Other studies Reviewed: Additional studies/ records that were reviewed today include: Echo images, office notes.  Review of the above records demonstrates: Moderately severe AS   Assessment and Plan:   1. Severe Aortic Valve Stenosis: He has moderate to severe aortic valve stenosis. I have personally reviewed the echo images. The aortic valve is thickened, calcified with limited leaflet  mobility. His gradients are in the moderate range. His dimensionless index and AVA are also in the moderate range. At this time, he appears to be asymptomatic from his AS. He is dealing with severe shoulder pain which has limited his lifestyle. When he becomes symptomatic, we will proceed with TAVR evaluation. I have discussed this with his wife and also who his daughter who is on the phone. Given advanced age, he is not a good candidate for conventional AVR by surgical approach. I think he may be a good candidate for TAVR.   I have reviewed the natural history of aortic stenosis with the patient and their family members  who are present today. We have discussed the limitations of medical therapy and the poor prognosis associated with symptomatic aortic stenosis. We have reviewed potential treatment options, including palliative medical therapy, conventional surgical aortic valve replacement, and transcatheter aortic valve replacement. We discussed treatment options in the context of the patient's specific comorbid medical conditions.   I will plan to see him back in my office in 6 weeks. If he becomes symptomatic before then, he will call back.      Current medicines are reviewed at length with the patient today.  The patient does not have concerns regarding medicines.  The following changes have been made:  no change  Labs/ tests ordered today include:  No orders of the defined types were placed in this encounter.    Disposition:   FU with me in 6 weeks.    Signed, Lauree Chandler, MD 11/07/2019 10:38 AM    [facility] [address], [place], [place]  [address] Phone: [phone]; Fax: [phone]

## 2019-11-08 ENCOUNTER — Ambulatory Visit: Payer: Medicare PPO | Attending: Family

## 2019-11-08 DIAGNOSIS — Z23 Encounter for immunization: Secondary | ICD-10-CM

## 2019-11-21 ENCOUNTER — Encounter: Payer: Self-pay | Admitting: Family Medicine

## 2019-11-21 ENCOUNTER — Ambulatory Visit (INDEPENDENT_AMBULATORY_CARE_PROVIDER_SITE_OTHER): Payer: Medicare PPO | Admitting: Family Medicine

## 2019-11-21 ENCOUNTER — Other Ambulatory Visit: Payer: Self-pay

## 2019-11-21 VITALS — BP 134/72 | HR 97 | Temp 97.9°F | Resp 14 | Ht 71.0 in | Wt 149.1 lb

## 2019-11-21 DIAGNOSIS — R634 Abnormal weight loss: Secondary | ICD-10-CM

## 2019-11-21 DIAGNOSIS — Z5181 Encounter for therapeutic drug level monitoring: Secondary | ICD-10-CM

## 2019-11-21 DIAGNOSIS — E1165 Type 2 diabetes mellitus with hyperglycemia: Secondary | ICD-10-CM

## 2019-11-21 DIAGNOSIS — D649 Anemia, unspecified: Secondary | ICD-10-CM

## 2019-11-21 DIAGNOSIS — N1831 Chronic kidney disease, stage 3a: Secondary | ICD-10-CM | POA: Diagnosis not present

## 2019-11-21 DIAGNOSIS — M79601 Pain in right arm: Secondary | ICD-10-CM

## 2019-11-21 DIAGNOSIS — M79602 Pain in left arm: Secondary | ICD-10-CM

## 2019-11-21 DIAGNOSIS — I10 Essential (primary) hypertension: Secondary | ICD-10-CM

## 2019-11-21 DIAGNOSIS — D696 Thrombocytopenia, unspecified: Secondary | ICD-10-CM | POA: Diagnosis not present

## 2019-11-21 DIAGNOSIS — R202 Paresthesia of skin: Secondary | ICD-10-CM

## 2019-11-21 DIAGNOSIS — E785 Hyperlipidemia, unspecified: Secondary | ICD-10-CM | POA: Diagnosis not present

## 2019-11-21 DIAGNOSIS — IMO0002 Reserved for concepts with insufficient information to code with codable children: Secondary | ICD-10-CM

## 2019-11-21 DIAGNOSIS — Z794 Long term (current) use of insulin: Secondary | ICD-10-CM

## 2019-11-21 DIAGNOSIS — R410 Disorientation, unspecified: Secondary | ICD-10-CM

## 2019-11-21 NOTE — Progress Notes (Signed)
 Name: Brian [name]   MRN: [id number]    DOB: 05/10/1934   Date:11/21/2019       Progress Note  Chief Complaint  Patient presents with  . Follow-up  . Hypertension  . Arthritis    in fingers and shoulders    Subjective:   Brian [name] is a 86 y.o. male, presents to clinic for routine follow up on the conditions listed above.  Hypertension:  Currently managed on quinapril, amlodipine - also sees cardiology  Pt reports good med compliance and denies any SE.  No lightheadedness, hypotension, syncope. Blood pressure today is well controlled. BP Readings from Last 3 Encounters:  11/21/19 134/72  11/07/19 (!) 108/48  10/31/19 122/68  Pt denies CP, SOB, exertional sx, LE edema, palpitation, Ha's, visual disturbances  Hyperlipidemia: Current Medication Regimen:  crestor 5 mg taking 4d a week Last Lipids: Lab Results  Component Value Date   CHOL 184 08/22/2019   HDL 68 08/22/2019   LDLCALC 99 08/22/2019   TRIG 79 08/22/2019   CHOLHDL 2.7 08/22/2019  tolerating meds - Denies: Chest pain, shortness of breath, myalgias.  DM - per endocrinology - pt when I met him had a lot of concerns about med SE, was changing medications often and last visit A1C had improved but was not yet controlled Lab Results  Component Value Date   HGBA1C 7.7 (H) 08/22/2019  Cannot see a visit with endo recently -  Foot exam done today O'Connell, Thomas Lynch, MD  [address]  [place], [place] [address]  [phone]  [phone] (Fax)    [name], [name], NP  [address]  [place], [name]  [phone]  [phone] (Fax)  Type 2 diabetes mellitus without complication, without long-term current use of insulin (CMS-HCC) (Primary Dx);  Hypertension associated with type 2 diabetes mellitus (CMS-HCC);  Hyperlipidemia associated with type 2 diabetes mellitus (CMS-HCC);  CKD stage 3 due to type 2 diabetes mellitus (CMS-HCC)    CBGs have been running  low 100's, pt having trouble remembering, and he is here by himself - his wife wrote a note with some of her concerns Pt is supposed to see endocrine, he has had several DM med concerns and previously was titrating levemir based off fasting CBG, we consulted [facility] pharmacist who worked with the pt, but he seems to still be doing same - he cannot tell me today with multiple attempts and redirects - how much he is taking and how often he is changing the dose. Says he is taking levemir 8-16 units daily - he does say that "this new insulin seems to take a lot longer" so I do not believe he is adjusting daily.  Weight loss, 20 lbs in the past several months - pt is here alone, his wife sent a note for him, with some of her concerns - including shoulder pain - wife writes that when he is in pain he looses his appetite.  pt is seeing ortho got an injection shoulder pain, he does have some numbness and tingling still.  Pt today states its feeling good, pain is intermittent, currently he denies pain. He feels good, he says his appetite is good, he's not eating "the same stuff that he used to" he denies any abd pain, change in bowels, N, V, D.  He does mention that once in a while his shoulder hurts and he doesn't feel like eating.    Wt Readings from Last 8 Encounters:  11/21/19 149 lb   1.6 oz (67.6 kg)  11/07/19 151 lb (68.5 kg)  10/31/19 150 lb 9.6 oz (68.3 kg)  09/15/19 167 lb (75.8 kg)  08/22/19 167 lb 3.2 oz (75.8 kg)  06/21/19 168 lb 11.2 oz (76.5 kg)  05/04/19 167 lb 9.6 oz (76 kg)  03/14/19 169 lb 14.4 oz (77.1 kg)   BMI Readings from Last 5 Encounters:  11/21/19 20.80 kg/m  11/07/19 21.06 kg/m  10/31/19 21.61 kg/m  09/15/19 25.39 kg/m  08/22/19 25.42 kg/m  Reviewed patient's weights per above, has lost 20 pounds in the past 9 months.  Pt recently saw cardiology for f/up on severe aortic stenosis, repeat ECHO done 09/2019, increase in mean gradient over past 2 years, pt referred to structural  heart for consideration of TAVR.  Hx of anemia and thrombocytopenia labs were done with routine visit in September when I first met him, repeated when he returns for other acute complaints December 2020 H&H was trending up and iron panel was unremarkable.    Patient Active Problem List   Diagnosis Date Noted  . Congestive heart failure ([facility]) 03/14/2019  . Uncontrolled type 2 diabetes mellitus with diabetic nephropathy ([facility]) 02/22/2018  . Heart murmur 11/04/2017  . Erectile dysfunction 09/10/2017  . Diastolic dysfunction without heart failure 06/08/2017  . Aortic stenosis, severe 09/21/2015  . Essential hypertension 05/08/2015  . Hyperlipidemia LDL goal <100 05/08/2015  . Diverticulosis of colon 05/08/2015  . Secondary hyperparathyroidism of renal origin ([facility]) 05/08/2015  . Chronic kidney disease (CKD), stage III (moderate) 05/08/2015  . Irritable bowel syndrome with constipation 05/08/2015  . Benign localized prostatic hyperplasia with lower urinary tract symptoms (LUTS) 05/08/2015    Past Surgical History:  Procedure Laterality Date  . CYSTOSCOPY/RETROGRADE/URETEROSCOPY/STONE EXTRACTION WITH BASKET N/A 03/22/2013   Procedure: CYSTOSCOPY BLADDER STONE STONE EXTRACTION;  Surgeon: [name], MD;  Location: [facility];  Service: Urology;  Laterality: N/A;  CYSTOSCOPY    . TONSILLECTOMY      Family History  Problem Relation Age of Onset  . Hyperlipidemia Father   . Congestive Heart Failure Other   . Heart attack Other   . Heart disease Other   . Diabetes Brother     Social History   Tobacco Use  . Smoking status: Former Smoker    Packs/day: 0.75    Years: 39.00    Pack years: 29.25    Types: Cigarettes    Quit date: 03/21/1993    Years since quitting: 26.6  . Smokeless tobacco: Never Used  . Tobacco comment: smoking cessation materials not required  Substance Use Topics  . Alcohol use: No    Alcohol/week: 0.0 standard drinks  . Drug use: No       Current Outpatient Medications:  .  amLODipine (NORVASC) 10 MG tablet, TAKE 1 TABLET (10 MG TOTAL) BY MOUTH DAILY. (FOR HIGH BLOOD PRESSURE), Disp: 90 tablet, Rfl: 2 .  aspirin EC 81 MG tablet, Take 81 mg by mouth daily. Reported on 09/17/2015, Disp: , Rfl:  .  COMBIGAN 0.2-0.5 % ophthalmic solution, Apply 1 drop to eye 2 (two) times daily., Disp: , Rfl:  .  docusate sodium (DOK) 100 MG capsule, Take 1 capsule (100 mg total) by mouth 2 (two) times daily as needed., Disp: 100 capsule, Rfl: 5 .  finasteride (PROSCAR) 5 MG tablet, Take 1 tablet (5 mg total) by mouth daily., Disp: 90 tablet, Rfl: 3 .  Insulin Detemir (LEVEMIR FLEXTOUCH) 100 UNIT/ML Pen, Inject 10-20 Units into the skin daily., Disp:  15 mL, Rfl: 1 .  Omega-3 Fatty Acids (FISH OIL) 1000 MG CAPS, Take 1 capsule (1,000 mg total) by mouth daily., Disp: 90 capsule, Rfl: 3 .  quinapril (ACCUPRIL) 20 MG tablet, TAKE 1 TABLET BY MOUTH TWICE A DAY, Disp: 180 tablet, Rfl: 2 .  rosuvastatin (CRESTOR) 5 MG tablet, TAKE ONE TAB BY MOUTH FOUR NIGHTS A WEEK, Disp: 52 tablet, Rfl: 1 .  tamsulosin (FLOMAX) 0.4 MG CAPS capsule, TAKE 1 CAPSULE BY MOUTH EVERY DAY, Disp: 30 capsule, Rfl: 5 .  ACCU-CHEK GUIDE test strip, USE UP TO 4 TIMES A DAY AS DIRECTED, Disp: 100 strip, Rfl: 0 .  Accu-Chek Softclix Lancets lancets, , Disp: , Rfl:  .  Insulin Pen Needle (B-D UF III MINI PEN NEEDLES) 31G X 5 MM MISC, USE AS DIRECTED WITH LEVEMIR INSULIN PENS AT BEDTIME, once a day, Disp: 100 each, Rfl: 3  Allergies  Allergen Reactions  . Carvedilol Other (See Comments)    bradycardia  . Niaspan [Niacin Er]   . Shellfish Allergy Other (See Comments)    Pt vomits  . Statins Other (See Comments)    Myalgia  . Sulfa Antibiotics   . Zetia [Ezetimibe]   . Januvia [Sitagliptin] Rash    Chart Review Today: I personally reviewed active problem list, medication list, allergies, family history, social history, health maintenance, notes from last encounter, lab  results, imaging with the patient/caregiver today.   Review of Systems  10 Systems reviewed and are negative for acute change except as noted in the HPI.    Objective:    Vitals:   11/21/19 0831  BP: 134/72  Pulse: 97  Resp: 14  Temp: 97.9 F (36.6 C)  SpO2: 97%  Weight: 149 lb 1.6 oz (67.6 kg)  Height: 5' 11" (1.803 m)    Body mass index is 20.8 kg/m.  Physical Exam Vitals and nursing note reviewed.  Constitutional:      General: He is not in acute distress.    Appearance: Normal appearance. He is not toxic-appearing or diaphoretic.  HENT:     Head: Normocephalic and atraumatic.     Right Ear: External ear normal.     Left Ear: External ear normal.  Eyes:     General:        Right eye: Discharge present.        Left eye: No discharge.     Conjunctiva/sclera: Conjunctivae normal.  Cardiovascular:     Rate and Rhythm: Normal rate and regular rhythm.     Pulses: Normal pulses.     Heart sounds: Murmur present. No friction rub. No gallop.   Pulmonary:     Effort: Pulmonary effort is normal. No respiratory distress.     Breath sounds: Normal breath sounds. No stridor. No wheezing.  Abdominal:     General: Bowel sounds are normal. There is no distension.     Palpations: Abdomen is soft.     Tenderness: There is no abdominal tenderness.  Musculoskeletal:     Cervical back: Normal range of motion.  Skin:    General: Skin is warm.     Coloration: Skin is not pale.     Comments: Large nodule to midline of his mid thoracic back, nontender and somewhat fluctuant, roughly 5 x 4 cm in diameter and raised at least 2 cm, no erythema, some hyperpigmentation and open comedones scattered over area   Neurological:     Mental Status: He is alert.     Motor: No  weakness.     Comments: Oriented to person, few times during visit and in the lobby he was not oriented to place or time Speech was clear no dysarthria Face symmetrical no droop 5 out of 5 bilateral grip strength    Psychiatric:        Mood and Affect: Mood normal.       Diabetic Foot Exam: Diabetic Foot Exam - Simple   Simple Foot Form Diabetic Foot exam was performed with the following findings: Yes 11/21/2019  8:55 AM  Visual Inspection Sensation Testing Pulse Check Comments     PHQ2/9: Depression screen PHQ 2/9 11/21/2019 09/15/2019 08/22/2019 08/17/2019 06/21/2019  Decreased Interest 0 0 0 0 0  Down, Depressed, Hopeless 0 0 0 0 0  PHQ - 2 Score 0 0 0 0 0  Altered sleeping 0 - 0 0 0  Tired, decreased energy 0 - 0 0 0  Change in appetite 0 - 0 0 0  Feeling bad or failure about yourself  0 - 0 0 0  Trouble concentrating 0 - 0 0 0  Moving slowly or fidgety/restless 0 - 0 0 0  Suicidal thoughts 0 - 0 0 0  PHQ-9 Score 0 - 0 0 0  Difficult doing work/chores Not difficult at all - Not difficult at all Not difficult at all Not difficult at all  Some recent data might be hidden    phq 9 is neg, reviewed  Fall Risk: Fall Risk  11/21/2019 09/15/2019 08/22/2019 08/17/2019 06/21/2019  Falls in the past year? 1 1 1 1 0  Number falls in past yr: 1 0 1 0 0  Comment - - - 2-3 month ago getting off bus. -  Injury with Fall? 0 0 0 0 0  Risk for fall due to : - - - - -  Risk for fall due to: Comment - - - - -  Follow up - Falls prevention discussed - - -    Functional Status Survey: Is the patient deaf or have difficulty hearing?: No Does the patient have difficulty seeing, even when wearing glasses/contacts?: No Does the patient have difficulty concentrating, remembering, or making decisions?: No Does the patient have difficulty walking or climbing stairs?: No Does the patient have difficulty dressing or bathing?: No Does the patient have difficulty doing errands alone such as visiting a doctor's office or shopping?: No   Assessment & Plan:     ICD-10-CM   1. Hyperlipidemia LDL goal <100  E78.5 COMPLETE METABOLIC PANEL WITH GFR    Lipid panel   tolerating statin 3-4 days ago week,  cholesterol has been well controlled, recheck labs today  2. Hypertension goal BP (blood pressure) < 140/90  I10 COMPLETE METABOLIC PANEL WITH GFR   BP well controlled, no concerning sx or se, sees cardiology for severe AS  3. Thrombocytopenia (HCC)  D69.6 CBC with Differential/Platelet   low platelets 04/2019, recheck labs today  4. Stage 3a chronic kidney disease  N18.31 COMPLETE METABOLIC PANEL WITH GFR   monitoring - pt does also see nephrology, but could not review their last visit in EMR  5. Anemia, unspecified type  D64.9 CBC with Differential/Platelet   labs from sept last year showed anemia, it did improve with labs in december - recheck  6. Paresthesia and pain of both upper extremities  R20.2    M79.601    M79.602    some persistent numbness, but improving with ortho tx  7. Insulin dependent type 2 diabetes   mellitus, uncontrolled (HCC)  E11.65 COMPLETE METABOLIC PANEL WITH GFR   Z79.4 Hemoglobin A1c    CANCELED: Hemoglobin A1c   sees endocrinology, previously controlled, though he adjusts meds often despite attempts to educate him, pharmacy consults, per reports CBGs good, recheck a1c  8. Encounter for medication monitoring  Z51.81 COMPLETE METABOLIC PANEL WITH GFR    Lipid panel    CBC with Differential/Platelet    Hemoglobin A1c  9. Unintentional weight loss  R63.4    ~20 lbs weight loss in the past 9 months, pt denies sx, says eating different than he used to, poor historian, pt alone today, intermittently disoriented  10. Confused but orients easily  R41.0    poor historian, no sig changes from what I know of pt, similar to when we met 6 months ago, but confused when in lobby - asked where 6[place] flood was?   Reviewed old records, MWV and screenings - pt recently had MWV in Jan 2021, 6CIT score had increased since last visit  Cognitive Function 6CIT Screen 09/15/2019 04/30/2018  What Year? 0 points 0 points  What month? 0 points 0 points  What time? 0 points 3 points  Count  back from 20 0 points 0 points  Months in reverse 4 points 0 points  Repeat phrase 8 points 6 points  Total Score 12 9        Return for 1 month f/up for weightloss, come with wife if able, in person.    , PA-C 11/21/19 8:36 AM   

## 2019-11-21 NOTE — Patient Instructions (Signed)
We will do a closer follow up to check weight.  Please come with your wife  WE will call you with lab results.

## 2019-11-22 ENCOUNTER — Telehealth: Payer: Self-pay

## 2019-11-22 LAB — COMPLETE METABOLIC PANEL WITH GFR
AG Ratio: 1.1 (calc) (ref 1.0–2.5)
ALT: 7 U/L — ABNORMAL LOW (ref 9–46)
AST: 11 U/L (ref 10–35)
Albumin: 3.3 g/dL — ABNORMAL LOW (ref 3.6–5.1)
Alkaline phosphatase (APISO): 47 U/L (ref 35–144)
BUN/Creatinine Ratio: 13 (calc) (ref 6–22)
BUN: 16 mg/dL (ref 7–25)
CO2: 24 mmol/L (ref 20–32)
Calcium: 9.1 mg/dL (ref 8.6–10.3)
Chloride: 106 mmol/L (ref 98–110)
Creat: 1.2 mg/dL — ABNORMAL HIGH (ref 0.70–1.11)
GFR, Est African American: 64 mL/min/{1.73_m2} (ref >=60)
GFR, Est Non African American: 55 mL/min/{1.73_m2} — ABNORMAL LOW (ref >=60)
Globulin: 3.1 g/dL (calc) (ref 1.9–3.7)
Glucose, Bld: 256 mg/dL — ABNORMAL HIGH (ref 65–99)
Potassium: 4.6 mmol/L (ref 3.5–5.3)
Sodium: 137 mmol/L (ref 135–146)
Total Bilirubin: 0.3 mg/dL (ref 0.2–1.2)
Total Protein: 6.4 g/dL (ref 6.1–8.1)

## 2019-11-22 LAB — CBC WITH DIFFERENTIAL/PLATELET
Absolute Monocytes: 649 cells/uL (ref 200–950)
Basophils Absolute: 21 cells/uL (ref 0–200)
Basophils Relative: 0.3 %
Eosinophils Absolute: 83 cells/uL (ref 15–500)
Eosinophils Relative: 1.2 %
HCT: 31.7 % — ABNORMAL LOW (ref 38.5–50.0)
Hemoglobin: 10 g/dL — ABNORMAL LOW (ref 13.2–17.1)
Lymphs Abs: 1187 cells/uL (ref 850–3900)
MCH: 28.2 pg (ref 27.0–33.0)
MCHC: 31.5 g/dL — ABNORMAL LOW (ref 32.0–36.0)
MCV: 89.5 fL (ref 80.0–100.0)
MPV: 10 fL (ref 7.5–12.5)
Monocytes Relative: 9.4 %
Neutro Abs: 4961 cells/uL (ref 1500–7800)
Neutrophils Relative %: 71.9 %
Platelets: 311 10*3/uL (ref 140–400)
RBC: 3.54 10*6/uL — ABNORMAL LOW (ref 4.20–5.80)
RDW: 12.8 % (ref 11.0–15.0)
Total Lymphocyte: 17.2 %
WBC: 6.9 10*3/uL (ref 3.8–10.8)

## 2019-11-22 LAB — LIPID PANEL
Cholesterol: 174 mg/dL (ref <200)
HDL: 51 mg/dL (ref >=40)
LDL Cholesterol (Calc): 106 mg/dL (calc) — ABNORMAL HIGH
Non-HDL Cholesterol (Calc): 123 mg/dL (calc) (ref <130)
Total CHOL/HDL Ratio: 3.4 (calc) (ref <5.0)
Triglycerides: 77 mg/dL (ref <150)

## 2019-11-22 LAB — HEMOGLOBIN A1C
Hgb A1c MFr Bld: 9.8 % of total Hgb — ABNORMAL HIGH (ref <5.7)
Mean Plasma Glucose: 235 (calc)
eAG (mmol/L): 13 (calc)

## 2019-11-22 NOTE — Telephone Encounter (Signed)
I did not call them, was this regarding her coming to his next appt?

## 2019-11-22 NOTE — Telephone Encounter (Signed)
Copied from [place] [id number]. Topic: General - Call Back - No Documentation >> Nov 21, 2019  4:59 PM Sheran Luz wrote: Patient's wife states she is returning call to office. Per wife, there was a VM to speak with Leisa Tapia's CMA. No documentation found.

## 2019-11-23 DIAGNOSIS — D649 Anemia, unspecified: Secondary | ICD-10-CM | POA: Insufficient documentation

## 2019-11-23 DIAGNOSIS — D638 Anemia in other chronic diseases classified elsewhere: Secondary | ICD-10-CM | POA: Insufficient documentation

## 2019-11-29 ENCOUNTER — Telehealth: Payer: Self-pay | Admitting: Family Medicine

## 2019-11-29 NOTE — Telephone Encounter (Signed)
They will need to reach out to specialist endo who manages DM

## 2019-11-29 NOTE — Telephone Encounter (Signed)
Copied from [place] [phone]. Topic: General - Other >> Nov 29, 2019 12:06 PM Celene Kras wrote: Reason for CRM: Pts wife called stating that pt needs an order for a libre or dycon meter for pt. Please advise.     [id number] [address], [place] [address] [phone] FaxXO:[phone] Not a 24 hour pharmacy; exact hours not known.

## 2019-11-29 NOTE — Telephone Encounter (Signed)
Routing request to [facility].

## 2019-12-01 ENCOUNTER — Telehealth: Payer: Self-pay | Admitting: Family Medicine

## 2019-12-01 NOTE — Telephone Encounter (Signed)
Pt wife is calling to let leisa know that pt had colonoscopy about 5 year ago and was dx with diverticulitis. Pt at the time was bleeding heavy. Pt will bring stool test back tomorrow. Please advice

## 2019-12-02 ENCOUNTER — Telehealth: Payer: Self-pay | Admitting: Family Medicine

## 2019-12-02 NOTE — Telephone Encounter (Signed)
Stool Card is in the lab for [employer]. Left it on 12-02-2019

## 2019-12-21 ENCOUNTER — Other Ambulatory Visit: Payer: Self-pay

## 2019-12-21 DIAGNOSIS — D649 Anemia, unspecified: Secondary | ICD-10-CM

## 2019-12-21 DIAGNOSIS — D696 Thrombocytopenia, unspecified: Secondary | ICD-10-CM

## 2019-12-21 LAB — POC HEMOCCULT BLD/STL (HOME/3-CARD/SCREEN)
Card #2 Fecal Occult Blod, POC: NEGATIVE
Card #3 Fecal Occult Blood, POC: NEGATIVE
Fecal Occult Blood, POC: NEGATIVE

## 2019-12-21 NOTE — H&P (View-Only) (Signed)
[facility] Note  Chief Complaint  Patient presents with  . Follow-up    Severe aortic stenosis     History of Present Illness: 86 yo male with history of anemia, stage 3 chronic kidney disease, chronic diastolic CHF, diabetes mellitus, HTN, HLD and moderately severe aortic stenosis who is here today for follow up. I saw him as a new consult 11/07/19 to discuss his aortic stenosis. He has a history of bradycardia and has been off of beta blockers. He has a history of PVCs. His moderate aortic stenosis has been followed for the past several years. Echo 09/29/19 with LVEF=65-70%, moderate LVH, grade 1 diastolic dysfunction, mild MR. The aortic valve leaflets are thickened and calcified with limited leaflet mobility. Mean gradient 37 mmHg, peak gradient 58 mmHg, dimensionless index 0.26, AVA 1.22 cm2. This is consistent with moderate to severe aortic stenosis. Since he was asymptomatic and his aortic stenosis was felt to be moderately severe, we delayed any further workup.   He tells me today that he has started to have more fatigue with exertion. He denies chest pain, dyspnea, LE edema or dizziness. He lives with his wife. He has dentures and a bridge with several of his own teeth. No active dental issues. He is a retired from [employer] and served in the [employer].   Primary Care Physician: Delsa Grana, PA-C Primary Cardiologist: Skeet Latch Referring Cardiologist: Skeet Latch  Past Medical History:  Diagnosis Date  . Anemia of chronic renal failure 05/08/2015  . Anemia of chronic renal failure 05/08/2015  . Aortic stenosis, mild 09/21/2015  . Aortic stenosis, severe 09/21/2015  . Arthritis   . Bladder stone   . Chronic kidney disease (CKD), stage III (moderate) 05/08/2015   Followed by Dr. Rolly Salter   . Congestive heart failure ([place]) 03/14/2019  . Diastolic dysfunction without heart failure 06/08/2017  . Essential hypertension 05/08/2015  . History of bladder stone   .  Hyperlipidemia   . Hypertension   . Nocturia   . Renal disorder   . Type 2 diabetes mellitus ([place])     Past Surgical History:  Procedure Laterality Date  . CYSTOSCOPY/RETROGRADE/URETEROSCOPY/STONE EXTRACTION WITH BASKET N/A 03/22/2013   Procedure: CYSTOSCOPY BLADDER STONE STONE EXTRACTION;  Surgeon: [name], MD;  Location: [facility];  Service: Urology;  Laterality: N/A;  CYSTOSCOPY    . TONSILLECTOMY      Current Outpatient Medications  Medication Sig Dispense Refill  . ACCU-CHEK GUIDE test strip USE UP TO 4 TIMES A DAY AS DIRECTED 100 strip 0  . Accu-Chek Softclix Lancets lancets     . aspirin EC 81 MG tablet Take 81 mg by mouth daily. Reported on 09/17/2015    . COMBIGAN 0.2-0.5 % ophthalmic solution Apply 1 drop to eye 2 (two) times daily.    Marland Kitchen docusate sodium (DOK) 100 MG capsule Take 1 capsule (100 mg total) by mouth 2 (two) times daily as needed. 100 capsule 5  . finasteride (PROSCAR) 5 MG tablet Take 1 tablet (5 mg total) by mouth daily. 90 tablet 3  . Insulin Detemir (LEVEMIR FLEXTOUCH) 100 UNIT/ML Pen Inject 10-20 Units into the skin daily. 15 mL 1  . Insulin Pen Needle (B-D UF III MINI PEN NEEDLES) 31G X 5 MM MISC USE AS DIRECTED WITH LEVEMIR INSULIN PENS AT BEDTIME, once a day 100 each 3  . Omega-3 Fatty Acids (FISH OIL) 1000 MG CAPS Take 1 capsule (1,000 mg total) by mouth daily. 90 capsule 3  .  quinapril (ACCUPRIL) 20 MG tablet TAKE 1 TABLET BY MOUTH TWICE A DAY 180 tablet 2  . rosuvastatin (CRESTOR) 5 MG tablet TAKE ONE TAB BY MOUTH FOUR NIGHTS A WEEK 52 tablet 1  . tamsulosin (FLOMAX) 0.4 MG CAPS capsule TAKE 1 CAPSULE BY MOUTH EVERY DAY 30 capsule 5  . amLODipine (NORVASC) 10 MG tablet TAKE 1 TABLET (10 MG TOTAL) BY MOUTH DAILY. (FOR HIGH BLOOD PRESSURE) (Patient not taking: Reported on 12/22/2019) 90 tablet 2   No current facility-administered medications for this visit.    Allergies  Allergen Reactions  . Carvedilol Other (See Comments)     bradycardia  . Niaspan [Niacin Er]   . Shellfish Allergy Other (See Comments)    Pt vomits  . Statins Other (See Comments)    Myalgia  . Sulfa Antibiotics   . Zetia [Ezetimibe]   . Januvia [Sitagliptin] Rash    Social History   Socioeconomic History  . Marital status: Married    Spouse name: [name]  . Number of children: 4  . Years of education: some college  . Highest education level: 12th grade  Occupational History  . Occupation: Engineer, manufacturing systems  Tobacco Use  . Smoking status: Former Smoker    Packs/day: 0.75    Years: 39.00    Pack years: 29.25    Types: Cigarettes    Quit date: 03/21/1993    Years since quitting: 26.7  . Smokeless tobacco: Never Used  . Tobacco comment: smoking cessation materials not required  Substance and Sexual Activity  . Alcohol use: No    Alcohol/week: 0.0 standard drinks  . Drug use: No  . Sexual activity: Yes  Other Topics Concern  . Not on file  Social History Narrative   Lives with wife in [place], [place].   Social Determinants of Health   Financial Resource Strain: Low Risk   . Difficulty of Paying Living Expenses: Not very hard  Food Insecurity: No Food Insecurity  . Worried About Charity fundraiser in the Last Year: Never true  . Ran Out of Food in the Last Year: Never true  Transportation Needs: No Transportation Needs  . Lack of Transportation (Medical): No  . Lack of Transportation (Non-Medical): No  Physical Activity: Inactive  . Days of Exercise per Week: 0 days  . Minutes of Exercise per Session: 0 min  Stress: No Stress Concern Present  . Feeling of Stress : Not at all  Social Connections: Slightly Isolated  . Frequency of Communication with Friends and Family: More than three times a week  . Frequency of Social Gatherings with Friends and Family: More than three times a week  . Attends Religious Services: More than 4 times per year  . Active Member of Clubs or Organizations: No  . Attends Archivist  Meetings: Never  . Marital Status: Married  Human resources officer Violence: Not At Risk  . Fear of Current or Ex-Partner: No  . Emotionally Abused: No  . Physically Abused: No  . Sexually Abused: No    Family History  Problem Relation Age of Onset  . Hyperlipidemia Father   . Congestive Heart Failure Other   . Heart attack Other   . Heart disease Other   . Diabetes Brother     Review of Systems:  As stated in the HPI and otherwise negative.   BP 100/60   Pulse (!) 111   Ht 5\' 11"  (1.803 m)   Wt 141 lb (64 kg)   SpO2  98%   BMI 19.67 kg/m   Physical Examination: General: Well developed, well nourished, NAD  HEENT: OP clear, mucus membranes moist  SKIN: warm, dry. No rashes. Neuro: No focal deficits  Musculoskeletal: Muscle strength 5/5 all ext  Psychiatric: Mood and affect normal  Neck: No JVD, no carotid bruits, no thyromegaly, no lymphadenopathy.  Lungs:Clear bilaterally, no wheezes, rhonci, crackles Cardiovascular: Regular rate and rhythm. Loud, harsh systolic murmur.  Abdomen:Soft. Bowel sounds present. Non-tender.  Extremities: No lower extremity edema. Pulses are 2 + in the bilateral DP/PT.  EKG:  EKG is ordered today. The ekg ordered today demonstrates sinus  Echo 09/29/19: 1. Left ventricular ejection fraction, by visual estimation, is 65 to  70%. The left ventricle has hyperdynamic function. There is moderately  increased left ventricular hypertrophy.  2. Elevated left atrial pressure.  3. Left ventricular diastolic parameters are consistent with Grade I  diastolic dysfunction (impaired relaxation).  4. The left ventricle has no regional wall motion abnormalities.  5. Global right ventricle has normal systolic function.The right  ventricular size is normal. No increase in right ventricular wall  thickness.  6. Left atrial size was normal.  7. Right atrial size was normal.  8. The mitral valve is normal in structure. Mild mitral valve  regurgitation.  No evidence of mitral stenosis.  9. The tricuspid valve is normal in structure.  10. The tricuspid valve is normal in structure. Tricuspid valve  regurgitation is mild.  11. Aortic valve mean gradient measures 37.0 mmHg.  12. The aortic valve is normal in structure. Aortic valve regurgitation is  not visualized. Severe aortic valve stenosis.  13. There is severe calcifcation of the aortic valve.  14. There is severe thickening of the aortic valve.  15. The pulmonic valve was normal in structure. Pulmonic valve  regurgitation is not visualized.  16. Normal pulmonary artery systolic pressure.  17. The tricuspid regurgitant velocity is 2.33 m/s, and with an assumed  right atrial pressure of 3 mmHg, the estimated right ventricular systolic  pressure is normal at 24.7 mmHg.  18. The inferior vena cava is normal in size with greater than 50%  respiratory variability, suggesting right atrial pressure of 3 mmHg.  19. Severe aortic stenosis, peak/mean transaortic gradients 58/37 mmHg,  dimensionless index 0.26.   FINDINGS  Left Ventricle: Left ventricular ejection fraction, by visual estimation,  is 65 to 70%. The left ventricle has hyperdynamic function. The left  ventricle has no regional wall motion abnormalities. There is moderately  increased left ventricular  hypertrophy. Concentric left ventricular hypertrophy. Left ventricular  diastolic parameters are consistent with Grade I diastolic dysfunction  (impaired relaxation). Elevated left atrial pressure.   Right Ventricle: The right ventricular size is normal. No increase in  right ventricular wall thickness. Global RV systolic function is has  normal systolic function. The tricuspid regurgitant velocity is 2.33 m/s,  and with an assumed right atrial pressure  of 3 mmHg, the estimated right ventricular systolic pressure is normal at  24.7 mmHg.   Left Atrium: Left atrial size was normal in size.   Right Atrium: Right atrial size  was normal in size   Pericardium: There is no evidence of pericardial effusion.   Mitral Valve: The mitral valve is normal in structure. Mild mitral valve  regurgitation. No evidence of mitral valve stenosis by observation.   Tricuspid Valve: The tricuspid valve is normal in structure. Tricuspid  valve regurgitation is mild.   Aortic Valve: The aortic valve is normal  in structure.. There is severe  thickening and severe calcifcation of the aortic valve. Aortic valve  regurgitation is not visualized. Severe aortic stenosis is present. There  is severe thickening of the aortic  valve. There is severe calcifcation of the aortic valve. Aortic valve mean  gradient measures 37.0 mmHg. Aortic valve peak gradient measures 42.8  mmHg. Aortic valve area, by VTI measures 1.22 cm.   Pulmonic Valve: The pulmonic valve was normal in structure. Pulmonic valve  regurgitation is not visualized. Pulmonic regurgitation is not visualized.   Aorta: The aortic root, ascending aorta and aortic arch are all  structurally normal, with no evidence of dilitation or obstruction.   Venous: The inferior vena cava is normal in size with greater than 50%  respiratory variability, suggesting right atrial pressure of 3 mmHg.   IAS/Shunts: No atrial level shunt detected by color flow Doppler. There is  no evidence of a patent foramen ovale. No ventricular septal defect is  seen or detected. There is no evidence of an atrial septal defect.     LEFT VENTRICLE  PLAX 2D  LVIDd:     4.20 cm Diastology  LVIDs:     2.60 cm LV e' lateral:  7.21 cm/s  LV PW:     1.40 cm LV E/e' lateral: 11.8  LV IVS:    1.10 cm LV e' medial:  6.34 cm/s  LVOT diam:   2.20 cm LV E/e' medial: 13.4  LV SV:     54 ml  LV SV Index:  28.16  LVOT Area:   3.80 cm     RIGHT VENTRICLE  RV Basal diam: 3.90 cm  RV S prime:   24.40 cm/s  TAPSE (M-mode): 2.5 cm   LEFT ATRIUM      Index    RIGHT  ATRIUM      Index  LA diam:   3.80 cm 2.01 cm/m RA Area:   11.60 cm  LA Vol (A2C): 35.2 ml 18.60 ml/m RA Volume:  27.50 ml 14.53 ml/m  LA Vol (A4C): 40.5 ml 21.39 ml/m  AORTIC VALVE  AV Area (Vmax):  1.23 cm  AV Area (Vmean):  1.27 cm  AV Area (VTI):   1.22 cm  AV Vmax:      327.20 cm/s  AV Vmean:     244.000 cm/s  AV VTI:      0.631 m  AV Peak Grad:   42.8 mmHg  AV Mean Grad:   37.0 mmHg  LVOT Vmax:     105.45 cm/s  LVOT Vmean:    81.500 cm/s  LVOT VTI:     0.202 m  LVOT/AV VTI ratio: 0.32    AORTA  Ao Root diam: 4.00 cm  Ao Asc diam: 3.70 cm   MITRAL VALVE             TRICUSPID VALVE  MV Area (PHT): 2.42 cm       TR Peak grad:  21.7 mmHg  MV PHT:    90.77 msec      TR Vmax:    233.00 cm/s  MV Decel Time: 313 msec  MV E velocity: 84.80 cm/s 103 cm/s SHUNTS  MV A velocity: 110.00 cm/s 70.3 cm/s Systemic VTI: 0.20 m  MV E/A ratio: 0.77    1.5    Systemic Diam: 2.20 cm   Recent Labs: 08/22/2019: TSH 1.71 11/21/2019: ALT 7; BUN 16; Creat 1.20; Hemoglobin 10.0; Platelets 311; Potassium 4.6; Sodium 137   Lipid Panel  Component Value Date/Time   CHOL 174 11/21/2019 0904   TRIG 77 11/21/2019 0904   HDL 51 11/21/2019 0904   CHOLHDL 3.4 11/21/2019 0904   VLDL 17 02/27/2017 0844   LDLCALC 106 (H) 11/21/2019 0904     Wt Readings from Last 3 Encounters:  12/22/19 141 lb (64 kg)  11/21/19 149 lb 1.6 oz (67.6 kg)  11/07/19 151 lb (68.5 kg)      Assessment and Plan:   1. Severe Aortic Valve Stenosis: He has severe, stage D aortic valve stenosis. I have personally reviewed the echo images. The aortic valve is thickened, calcified with limited leaflet mobility. I think he would benefit from AVR. Given advanced age, he is not a good candidate for conventional AVR by surgical approach. I think he may be a good candidate for TAVR.   STS Risk Score:  Risk of  Mortality: 1.897% Renal Failure: 3.165% Permanent Stroke: 2.288% Prolonged Ventilation: 6.635% DSW Infection: 0.046% Reoperation: 4.817% Morbidity or Mortality: 12.626% Short Length of Stay: 25.695% Long Length of Stay: 6.733%  I have reviewed the natural history of aortic stenosis with the patient and their family members  who are present today. We have discussed the limitations of medical therapy and the poor prognosis associated with symptomatic aortic stenosis. We have reviewed potential treatment options, including palliative medical therapy, conventional surgical aortic valve replacement, and transcatheter aortic valve replacement. We discussed treatment options in the context of the patient's specific comorbid medical conditions.   He would like to proceed with planning for TAVR. I will arrange a right and left heart catheterization at [facility] 01/12/20. Risks and benefits of procedure reviewed with the patient. After the cath, he will have a cardiac CT, CTA of the chest/abdomen and pelvis, carotid artery dopplers and will then be referred to see one of the CT surgeons on our TAVR team.       Current medicines are reviewed at length with the patient today.  The patient does not have concerns regarding medicines.  The following changes have been made:  no change  Labs/ tests ordered today include:   Orders Placed This Encounter  Procedures  . Basic metabolic panel  . CBC with Differential/Platelet  . EKG 12-Lead     Disposition:   F/U with the valve team.    Signed, Lauree Chandler, MD 12/22/2019 10:21 AM    [address], [place], [place]  [address] Phone: [phone]; Fax: [phone]

## 2019-12-21 NOTE — Progress Notes (Signed)
Structural Heart Clinic Note  Chief Complaint  Patient presents with  . Follow-up    Severe aortic stenosis     History of Present Illness: 86 yo male with history of anemia, stage 3 chronic kidney disease, chronic diastolic CHF, diabetes mellitus, HTN, HLD and moderately severe aortic stenosis who is here today for follow up. I saw him as a new consult 11/07/19 to discuss his aortic stenosis. He has a history of bradycardia and has been off of beta blockers. He has a history of PVCs. His moderate aortic stenosis has been followed for the past several years. Echo 09/29/19 with LVEF=65-70%, moderate LVH, grade 1 diastolic dysfunction, mild MR. The aortic valve leaflets are thickened and calcified with limited leaflet mobility. Mean gradient 37 mmHg, peak gradient 58 mmHg, dimensionless index 0.26, AVA 1.22 cm2. This is consistent with moderate to severe aortic stenosis. Since he was asymptomatic and his aortic stenosis was felt to be moderately severe, we delayed any further workup.   He tells me today that he has started to have more fatigue with exertion. He denies chest pain, dyspnea, LE edema or dizziness. He lives with his wife. He has dentures and a bridge with several of his own teeth. No active dental issues. He is a retired from [employer] and served in the [employer].   Primary Care Physician: Delsa Grana, PA-C Primary Cardiologist: Skeet Latch Referring Cardiologist: Skeet Latch  Past Medical History:  Diagnosis Date  . Anemia of chronic renal failure 05/08/2015  . Anemia of chronic renal failure 05/08/2015  . Aortic stenosis, mild 09/21/2015  . Aortic stenosis, severe 09/21/2015  . Arthritis   . Bladder stone   . Chronic kidney disease (CKD), stage III (moderate) 05/08/2015   Followed by Dr. Rolly Salter   . Congestive heart failure ([place]) 03/14/2019  . Diastolic dysfunction without heart failure 06/08/2017  . Essential hypertension 05/08/2015  . History of bladder stone   .  Hyperlipidemia   . Hypertension   . Nocturia   . Renal disorder   . Type 2 diabetes mellitus ([place])     Past Surgical History:  Procedure Laterality Date  . CYSTOSCOPY/RETROGRADE/URETEROSCOPY/STONE EXTRACTION WITH BASKET N/A 03/22/2013   Procedure: CYSTOSCOPY BLADDER STONE STONE EXTRACTION;  Surgeon: [name], MD;  Location: [facility];  Service: Urology;  Laterality: N/A;  CYSTOSCOPY    . TONSILLECTOMY      Current Outpatient Medications  Medication Sig Dispense Refill  . ACCU-CHEK GUIDE test strip USE UP TO 4 TIMES A DAY AS DIRECTED 100 strip 0  . Accu-Chek Softclix Lancets lancets     . aspirin EC 81 MG tablet Take 81 mg by mouth daily. Reported on 09/17/2015    . COMBIGAN 0.2-0.5 % ophthalmic solution Apply 1 drop to eye 2 (two) times daily.    Marland Kitchen docusate sodium (DOK) 100 MG capsule Take 1 capsule (100 mg total) by mouth 2 (two) times daily as needed. 100 capsule 5  . finasteride (PROSCAR) 5 MG tablet Take 1 tablet (5 mg total) by mouth daily. 90 tablet 3  . Insulin Detemir (LEVEMIR FLEXTOUCH) 100 UNIT/ML Pen Inject 10-20 Units into the skin daily. 15 mL 1  . Insulin Pen Needle (B-D UF III MINI PEN NEEDLES) 31G X 5 MM MISC USE AS DIRECTED WITH LEVEMIR INSULIN PENS AT BEDTIME, once a day 100 each 3  . Omega-3 Fatty Acids (FISH OIL) 1000 MG CAPS Take 1 capsule (1,000 mg total) by mouth daily. 90 capsule 3  .  quinapril (ACCUPRIL) 20 MG tablet TAKE 1 TABLET BY MOUTH TWICE A DAY 180 tablet 2  . rosuvastatin (CRESTOR) 5 MG tablet TAKE ONE TAB BY MOUTH FOUR NIGHTS A WEEK 52 tablet 1  . tamsulosin (FLOMAX) 0.4 MG CAPS capsule TAKE 1 CAPSULE BY MOUTH EVERY DAY 30 capsule 5  . amLODipine (NORVASC) 10 MG tablet TAKE 1 TABLET (10 MG TOTAL) BY MOUTH DAILY. (FOR HIGH BLOOD PRESSURE) (Patient not taking: Reported on 12/22/2019) 90 tablet 2   No current facility-administered medications for this visit.    Allergies  Allergen Reactions  . Carvedilol Other (See Comments)     bradycardia  . Niaspan [Niacin Er]   . Shellfish Allergy Other (See Comments)    Pt vomits  . Statins Other (See Comments)    Myalgia  . Sulfa Antibiotics   . Zetia [Ezetimibe]   . Januvia [Sitagliptin] Rash    Social History   Socioeconomic History  . Marital status: Married    Spouse name: [name]  . Number of children: 4  . Years of education: some college  . Highest education level: 12th grade  Occupational History  . Occupation: Engineer, manufacturing systems  Tobacco Use  . Smoking status: Former Smoker    Packs/day: 0.75    Years: 39.00    Pack years: 29.25    Types: Cigarettes    Quit date: 03/21/1993    Years since quitting: 26.7  . Smokeless tobacco: Never Used  . Tobacco comment: smoking cessation materials not required  Substance and Sexual Activity  . Alcohol use: No    Alcohol/week: 0.0 standard drinks  . Drug use: No  . Sexual activity: Yes  Other Topics Concern  . Not on file  Social History Narrative   Lives with wife in [place], [place].   Social Determinants of Health   Financial Resource Strain: Low Risk   . Difficulty of Paying Living Expenses: Not very hard  Food Insecurity: No Food Insecurity  . Worried About Charity fundraiser in the Last Year: Never true  . Ran Out of Food in the Last Year: Never true  Transportation Needs: No Transportation Needs  . Lack of Transportation (Medical): No  . Lack of Transportation (Non-Medical): No  Physical Activity: Inactive  . Days of Exercise per Week: 0 days  . Minutes of Exercise per Session: 0 min  Stress: No Stress Concern Present  . Feeling of Stress : Not at all  Social Connections: Slightly Isolated  . Frequency of Communication with Friends and Family: More than three times a week  . Frequency of Social Gatherings with Friends and Family: More than three times a week  . Attends Religious Services: More than 4 times per year  . Active Member of Clubs or Organizations: No  . Attends Archivist  Meetings: Never  . Marital Status: Married  Human resources officer Violence: Not At Risk  . Fear of Current or Ex-Partner: No  . Emotionally Abused: No  . Physically Abused: No  . Sexually Abused: No    Family History  Problem Relation Age of Onset  . Hyperlipidemia Father   . Congestive Heart Failure Other   . Heart attack Other   . Heart disease Other   . Diabetes Brother     Review of Systems:  As stated in the HPI and otherwise negative.   BP 100/60   Pulse (!) 111   Ht 5\' 11"  (1.803 m)   Wt 141 lb (64 kg)   SpO2  98%   BMI 19.67 kg/m   Physical Examination: General: Well developed, well nourished, NAD  HEENT: OP clear, mucus membranes moist  SKIN: warm, dry. No rashes. Neuro: No focal deficits  Musculoskeletal: Muscle strength 5/5 all ext  Psychiatric: Mood and affect normal  Neck: No JVD, no carotid bruits, no thyromegaly, no lymphadenopathy.  Lungs:Clear bilaterally, no wheezes, rhonci, crackles Cardiovascular: Regular rate and rhythm. Loud, harsh systolic murmur.  Abdomen:Soft. Bowel sounds present. Non-tender.  Extremities: No lower extremity edema. Pulses are 2 + in the bilateral DP/PT.  EKG:  EKG is ordered today. The ekg ordered today demonstrates sinus  Echo 09/29/19: 1. Left ventricular ejection fraction, by visual estimation, is 65 to  70%. The left ventricle has hyperdynamic function. There is moderately  increased left ventricular hypertrophy.  2. Elevated left atrial pressure.  3. Left ventricular diastolic parameters are consistent with Grade I  diastolic dysfunction (impaired relaxation).  4. The left ventricle has no regional wall motion abnormalities.  5. Global right ventricle has normal systolic function.The right  ventricular size is normal. No increase in right ventricular wall  thickness.  6. Left atrial size was normal.  7. Right atrial size was normal.  8. The mitral valve is normal in structure. Mild mitral valve  regurgitation.  No evidence of mitral stenosis.  9. The tricuspid valve is normal in structure.  10. The tricuspid valve is normal in structure. Tricuspid valve  regurgitation is mild.  11. Aortic valve mean gradient measures 37.0 mmHg.  12. The aortic valve is normal in structure. Aortic valve regurgitation is  not visualized. Severe aortic valve stenosis.  13. There is severe calcifcation of the aortic valve.  14. There is severe thickening of the aortic valve.  15. The pulmonic valve was normal in structure. Pulmonic valve  regurgitation is not visualized.  16. Normal pulmonary artery systolic pressure.  17. The tricuspid regurgitant velocity is 2.33 m/s, and with an assumed  right atrial pressure of 3 mmHg, the estimated right ventricular systolic  pressure is normal at 24.7 mmHg.  18. The inferior vena cava is normal in size with greater than 50%  respiratory variability, suggesting right atrial pressure of 3 mmHg.  19. Severe aortic stenosis, peak/mean transaortic gradients 58/37 mmHg,  dimensionless index 0.26.   FINDINGS  Left Ventricle: Left ventricular ejection fraction, by visual estimation,  is 65 to 70%. The left ventricle has hyperdynamic function. The left  ventricle has no regional wall motion abnormalities. There is moderately  increased left ventricular  hypertrophy. Concentric left ventricular hypertrophy. Left ventricular  diastolic parameters are consistent with Grade I diastolic dysfunction  (impaired relaxation). Elevated left atrial pressure.   Right Ventricle: The right ventricular size is normal. No increase in  right ventricular wall thickness. Global RV systolic function is has  normal systolic function. The tricuspid regurgitant velocity is 2.33 m/s,  and with an assumed right atrial pressure  of 3 mmHg, the estimated right ventricular systolic pressure is normal at  24.7 mmHg.   Left Atrium: Left atrial size was normal in size.   Right Atrium: Right atrial size  was normal in size   Pericardium: There is no evidence of pericardial effusion.   Mitral Valve: The mitral valve is normal in structure. Mild mitral valve  regurgitation. No evidence of mitral valve stenosis by observation.   Tricuspid Valve: The tricuspid valve is normal in structure. Tricuspid  valve regurgitation is mild.   Aortic Valve: The aortic valve is normal  in structure.. There is severe  thickening and severe calcifcation of the aortic valve. Aortic valve  regurgitation is not visualized. Severe aortic stenosis is present. There  is severe thickening of the aortic  valve. There is severe calcifcation of the aortic valve. Aortic valve mean  gradient measures 37.0 mmHg. Aortic valve peak gradient measures 42.8  mmHg. Aortic valve area, by VTI measures 1.22 cm.   Pulmonic Valve: The pulmonic valve was normal in structure. Pulmonic valve  regurgitation is not visualized. Pulmonic regurgitation is not visualized.   Aorta: The aortic root, ascending aorta and aortic arch are all  structurally normal, with no evidence of dilitation or obstruction.   Venous: The inferior vena cava is normal in size with greater than 50%  respiratory variability, suggesting right atrial pressure of 3 mmHg.   IAS/Shunts: No atrial level shunt detected by color flow Doppler. There is  no evidence of a patent foramen ovale. No ventricular septal defect is  seen or detected. There is no evidence of an atrial septal defect.     LEFT VENTRICLE  PLAX 2D  LVIDd:     4.20 cm Diastology  LVIDs:     2.60 cm LV e' lateral:  7.21 cm/s  LV PW:     1.40 cm LV E/e' lateral: 11.8  LV IVS:    1.10 cm LV e' medial:  6.34 cm/s  LVOT diam:   2.20 cm LV E/e' medial: 13.4  LV SV:     54 ml  LV SV Index:  28.16  LVOT Area:   3.80 cm     RIGHT VENTRICLE  RV Basal diam: 3.90 cm  RV S prime:   24.40 cm/s  TAPSE (M-mode): 2.5 cm   LEFT ATRIUM      Index    RIGHT  ATRIUM      Index  LA diam:   3.80 cm 2.01 cm/m RA Area:   11.60 cm  LA Vol (A2C): 35.2 ml 18.60 ml/m RA Volume:  27.50 ml 14.53 ml/m  LA Vol (A4C): 40.5 ml 21.39 ml/m  AORTIC VALVE  AV Area (Vmax):  1.23 cm  AV Area (Vmean):  1.27 cm  AV Area (VTI):   1.22 cm  AV Vmax:      327.20 cm/s  AV Vmean:     244.000 cm/s  AV VTI:      0.631 m  AV Peak Grad:   42.8 mmHg  AV Mean Grad:   37.0 mmHg  LVOT Vmax:     105.45 cm/s  LVOT Vmean:    81.500 cm/s  LVOT VTI:     0.202 m  LVOT/AV VTI ratio: 0.32    AORTA  Ao Root diam: 4.00 cm  Ao Asc diam: 3.70 cm   MITRAL VALVE             TRICUSPID VALVE  MV Area (PHT): 2.42 cm       TR Peak grad:  21.7 mmHg  MV PHT:    90.77 msec      TR Vmax:    233.00 cm/s  MV Decel Time: 313 msec  MV E velocity: 84.80 cm/s 103 cm/s SHUNTS  MV A velocity: 110.00 cm/s 70.3 cm/s Systemic VTI: 0.20 m  MV E/A ratio: 0.77    1.5    Systemic Diam: 2.20 cm   Recent Labs: 08/22/2019: TSH 1.71 11/21/2019: ALT 7; BUN 16; Creat 1.20; Hemoglobin 10.0; Platelets 311; Potassium 4.6; Sodium 137   Lipid Panel  Component Value Date/Time   CHOL 174 11/21/2019 0904   TRIG 77 11/21/2019 0904   HDL 51 11/21/2019 0904   CHOLHDL 3.4 11/21/2019 0904   VLDL 17 02/27/2017 0844   LDLCALC 106 (H) 11/21/2019 0904     Wt Readings from Last 3 Encounters:  12/22/19 141 lb (64 kg)  11/21/19 149 lb 1.6 oz (67.6 kg)  11/07/19 151 lb (68.5 kg)      Assessment and Plan:   1. Severe Aortic Valve Stenosis: He has severe, stage D aortic valve stenosis. I have personally reviewed the echo images. The aortic valve is thickened, calcified with limited leaflet mobility. I think he would benefit from AVR. Given advanced age, he is not a good candidate for conventional AVR by surgical approach. I think he may be a good candidate for TAVR.   STS Risk Score:  Risk of  Mortality: 1.897% Renal Failure: 3.165% Permanent Stroke: 2.288% Prolonged Ventilation: 6.635% DSW Infection: 0.046% Reoperation: 4.817% Morbidity or Mortality: 12.626% Short Length of Stay: 25.695% Long Length of Stay: 6.733%  I have reviewed the natural history of aortic stenosis with the patient and their family members  who are present today. We have discussed the limitations of medical therapy and the poor prognosis associated with symptomatic aortic stenosis. We have reviewed potential treatment options, including palliative medical therapy, conventional surgical aortic valve replacement, and transcatheter aortic valve replacement. We discussed treatment options in the context of the patient's specific comorbid medical conditions.   He would like to proceed with planning for TAVR. I will arrange a right and left heart catheterization at [facility] 01/12/20. Risks and benefits of procedure reviewed with the patient. After the cath, he will have a cardiac CT, CTA of the chest/abdomen and pelvis, carotid artery dopplers and will then be referred to see one of the CT surgeons on our TAVR team.       Current medicines are reviewed at length with the patient today.  The patient does not have concerns regarding medicines.  The following changes have been made:  no change  Labs/ tests ordered today include:   Orders Placed This Encounter  Procedures  . Basic metabolic panel  . CBC with Differential/Platelet  . EKG 12-Lead     Disposition:   F/U with the valve team.    Signed, Lauree Chandler, MD 12/22/2019 10:21 AM    [address], [place], [place]  [address] Phone: [phone]; Fax: [phone]

## 2019-12-22 ENCOUNTER — Other Ambulatory Visit: Payer: Self-pay

## 2019-12-22 ENCOUNTER — Ambulatory Visit: Payer: Medicare PPO | Admitting: Family Medicine

## 2019-12-22 ENCOUNTER — Ambulatory Visit: Payer: Medicare PPO | Admitting: Cardiovascular Disease

## 2019-12-22 ENCOUNTER — Encounter: Payer: Self-pay | Admitting: Cardiovascular Disease

## 2019-12-22 VITALS — BP 100/60 | HR 111 | Ht 71.0 in | Wt 141.0 lb

## 2019-12-22 DIAGNOSIS — I350 Nonrheumatic aortic (valve) stenosis: Secondary | ICD-10-CM

## 2019-12-22 LAB — BASIC METABOLIC PANEL
BUN/Creatinine Ratio: 15 (ref 10–24)
BUN: 18 mg/dL (ref 8–27)
CO2: 21 mmol/L (ref 20–29)
Calcium: 9.5 mg/dL (ref 8.6–10.2)
Chloride: 105 mmol/L (ref 96–106)
Creatinine, Ser: 1.18 mg/dL (ref 0.76–1.27)
GFR calc Af Amer: 65 mL/min/{1.73_m2} (ref >59)
GFR calc non Af Amer: 56 mL/min/{1.73_m2} — ABNORMAL LOW (ref >59)
Glucose: 259 mg/dL — ABNORMAL HIGH (ref 65–99)
Potassium: 4.2 mmol/L (ref 3.5–5.2)
Sodium: 139 mmol/L (ref 134–144)

## 2019-12-22 LAB — CBC WITH DIFFERENTIAL/PLATELET
Basophils Absolute: 0 10*3/uL (ref 0.0–0.2)
Basos: 0 %
EOS (ABSOLUTE): 0.1 10*3/uL (ref 0.0–0.4)
Eos: 1 %
Hematocrit: 31.1 % — ABNORMAL LOW (ref 37.5–51.0)
Hemoglobin: 10.1 g/dL — ABNORMAL LOW (ref 13.0–17.7)
Immature Grans (Abs): 0 10*3/uL (ref 0.0–0.1)
Immature Granulocytes: 1 %
Lymphocytes Absolute: 1.3 10*3/uL (ref 0.7–3.1)
Lymphs: 21 %
MCH: 28.6 pg (ref 26.6–33.0)
MCHC: 32.5 g/dL (ref 31.5–35.7)
MCV: 88 fL (ref 79–97)
Monocytes Absolute: 0.6 10*3/uL (ref 0.1–0.9)
Monocytes: 9 %
Neutrophils Absolute: 4.3 10*3/uL (ref 1.4–7.0)
Neutrophils: 68 %
Platelets: 308 10*3/uL (ref 150–450)
RBC: 3.53 x10E6/uL — ABNORMAL LOW (ref 4.14–5.80)
RDW: 13.3 % (ref 11.6–15.4)
WBC: 6.3 10*3/uL (ref 3.4–10.8)

## 2019-12-22 NOTE — Patient Instructions (Addendum)
COVID SCREENING INFORMATION (5/18): You are scheduled for your drive-thru COVID screening on 5/18 at 12:30PM. [facility]) [address] Stay in the RIGHT lane and proceed under the brick awning and tell them you are there for pre-procedure testing. Do NOT bring any pets with you to the testing site. You will need to go home after your screening and quarantine until your procedure. You are allowed to go to doctors' office visits during this time.   CATHETERIZATION INSTRUCTIONS (5/20): You are scheduled for a Cardiac Catheterization on Thursday, May 20 with Dr. Lauree Chandler.  1. Please arrive at the [facility] (Main Entrance A) at [facility]: [address] [place], [place] [address] at 10:00AM (This time is two hours before your procedure to ensure your preparation). Free valet parking service is available. You are allowed ONE visitor in the waiting room during your procedure. Both you and your guest must wear masks. Special note: Every effort is made to have your procedure done on time. Please understand that emergencies sometimes delay scheduled procedures.  2. Diet: Do not eat solid foods after midnight.  You may have clear liquids until 5am upon the day of the procedure.  3. Labs: TODAY! BMET, CBC  4. Medication instructions in preparation for your procedure:  1) Take half dose of insulin the night before your procedure  2) Do not take any insulin the day of your procedure  3) Make sure you take your aspirin the morning of your cath  4) you may take your other meds as directed with sips of water  5. Plan for one night stay--bring personal belongings. 6. Bring a current list of your medications and current insurance cards. 7. You MUST have a responsible person to drive you home. 8. Someone MUST be with you the first 24 hours after you arrive home or your discharge will be delayed. 9. Please wear clothes that are easy to get  on and off and wear slip-on shoes.  Thank you for allowing [place] to care for you!   -- [facility]

## 2019-12-26 ENCOUNTER — Other Ambulatory Visit: Payer: Self-pay | Admitting: Family Medicine

## 2019-12-29 ENCOUNTER — Other Ambulatory Visit: Payer: Self-pay

## 2019-12-29 ENCOUNTER — Ambulatory Visit: Payer: Medicare PPO | Admitting: Family Medicine

## 2019-12-29 ENCOUNTER — Encounter: Payer: Self-pay | Admitting: Family Medicine

## 2019-12-29 VITALS — BP 130/62 | HR 104 | Temp 97.6°F | Resp 14 | Ht 71.0 in | Wt 141.0 lb

## 2019-12-29 DIAGNOSIS — R634 Abnormal weight loss: Secondary | ICD-10-CM | POA: Diagnosis not present

## 2019-12-29 DIAGNOSIS — E46 Unspecified protein-calorie malnutrition: Secondary | ICD-10-CM

## 2019-12-29 DIAGNOSIS — N1831 Chronic kidney disease, stage 3a: Secondary | ICD-10-CM

## 2019-12-29 DIAGNOSIS — I509 Heart failure, unspecified: Secondary | ICD-10-CM

## 2019-12-29 DIAGNOSIS — D649 Anemia, unspecified: Secondary | ICD-10-CM

## 2019-12-29 DIAGNOSIS — N183 Chronic kidney disease, stage 3 unspecified: Secondary | ICD-10-CM

## 2019-12-29 DIAGNOSIS — I350 Nonrheumatic aortic (valve) stenosis: Secondary | ICD-10-CM

## 2019-12-29 DIAGNOSIS — N2581 Secondary hyperparathyroidism of renal origin: Secondary | ICD-10-CM

## 2019-12-29 DIAGNOSIS — E1122 Type 2 diabetes mellitus with diabetic chronic kidney disease: Secondary | ICD-10-CM | POA: Insufficient documentation

## 2019-12-29 DIAGNOSIS — E119 Type 2 diabetes mellitus without complications: Secondary | ICD-10-CM | POA: Insufficient documentation

## 2019-12-29 DIAGNOSIS — Z794 Long term (current) use of insulin: Secondary | ICD-10-CM

## 2019-12-29 NOTE — Patient Instructions (Addendum)
Anemia - work on your nutrition  We will recheck labs and blood levels in about 2 months.  Keep working on your diabetes and keep sugars under 200 or you can loose weight.  Suggest Boost 2-3 x a day in addition to 3 solid meals.    Protein-Energy Malnutrition Protein-energy malnutrition is when a person does not eat enough protein, fat, and calories. When this happens over time, it can lead to severe loss of muscle tissue (muscle wasting). This condition also affects the body's defense system (immune system) and can lead to other health problems. What are the causes? This condition may be caused by:  Not eating enough protein, fat, or calories.  Having certain chronic medical conditions.  Eating too little. What increases the risk? The following factors may make you more likely to develop this condition:  Living in poverty.  Long-term hospitalization.  Alcohol or drug dependency. Addiction often leads to a lifestyle in which proper diet is ignored. Dependency can also hurt the metabolism and the body's ability to absorb nutrients.  Eating disorders, such as anorexia nervosa or bulimia.  Chewing or swallowing problems. People with these disorders may not eat enough.  Having certain conditions, such as: ? Inflammatory bowel disease. Inflammation of the intestines makes it difficult for the body to absorb nutrients. ? Cancer or AIDS. These diseases can cause a loss of appetite. ? Chronic heart failure. This interferes with how the body uses nutrients. ? Cystic fibrosis. This disease can make it difficult for the body to absorb nutrients.  Eating a diet that extremely restricts protein, fat, or calorie intake. What are the signs or symptoms? Symptoms of this condition include:  Fatigue.  Weakness.  Dizziness.  Fainting.  Weight loss.  Loss of muscle tone and muscle mass.  Poor immune response.  Lack of menstruation.  Poor memory.  Hair loss.  Skin  changes. How is this diagnosed? This condition may be diagnosed based on:  Your medical and dietary history.  A physical exam. This may include a measurement of your body mass index (BMI).  Blood tests. How is this treated? This condition may be managed with:  Nutrition therapy. This may include working with a diet and nutrition specialist (dietitian).  Treatment for underlying conditions. People with severe protein-energy malnutrition may need to be treated in a hospital. This may involve receiving nutrition and fluids through an IV. Follow these instructions at home:   Eat a balanced diet. In each meal, include at least one food that is high in protein. Foods that are high in protein include: ? Meat. ? Poultry. ? Fish. ? Eggs. ? Cheese. ? Milk. ? Beans. ? Nuts.  Eat nutrient-rich foods that are easy to swallow and digest, such as: ? Fruit and yogurt smoothies. ? Oatmeal with nut butter.  Try to eat six small meals each day instead of three large meals.  Take vitamin and protein supplements as told by your health care provider or dietitian.  Follow your health care provider's recommendations about exercise and activity.  Keep all follow-up visits as told by your health care provider. This is important. Contact a health care provider if you:  Have increased weakness or fatigue.  Faint.  Are a woman and you stop having your period (menstruating).  Have rapid hair loss.  Have unexpected weight loss.  Have diarrhea.  Have nausea and vomiting. Get help right away if you have:  Difficulty breathing.  Chest pain. Summary  Protein-energy malnutrition is when a  person does not eat enough protein, fat, and calories.  Protein-energy malnutrition can lead to severe loss of muscle tissue (muscle wasting). This condition also affects the body's defense system (immune system) and can lead to other health problems.  Talk with your health care provider about  treatment for this condition. Effective treatment depends on the underlying cause of the malnutrition. This information is not intended to replace advice given to you by your health care provider. Make sure you discuss any questions you have with your health care provider. Document Revised: 08/26/2017 Document Reviewed: 08/26/2017 Elsevier Patient Education  [place].    Protein Content in Foods  Generally, most healthy people need around 50 grams of protein each day. Depending on your overall health, you may need more or less protein in your diet. Talk to your health care provider or dietitian about how much protein you need. See the following list for the protein content of some common foods. High-protein foods High-protein foods contain 4 grams (4 g) or more of protein per serving. They include:  Beef, ground sirloin (cooked) -- 3 oz have 24 g of protein.  Cheese (hard) -- 1 oz has 7 g of protein.  Chicken breast, boneless and skinless (cooked) -- 3 oz have 13.4 g of protein.  Cottage cheese -- 1/2 cup has 13.4 g of protein.  Egg -- 1 egg has 6 g of protein.  Fish, filet (cooked) -- 1 oz has 6-7 g of protein.  Garbanzo beans (canned or cooked) -- 1/2 cup has 6-7 g of protein.  Kidney beans (canned or cooked) -- 1/2 cup has 6-7 g of protein.  Lamb (cooked) -- 3 oz has 24 g of protein.  Milk -- 1 cup (8 oz) has 8 g of protein.  Nuts (peanuts, pistachios, almonds) -- 1 oz has 6 g of protein.  Peanut butter -- 1 oz has 7-8 g of protein.  Pork tenderloin (cooked) -- 3 oz has 18.4 g of protein.  Pumpkin seeds -- 1 oz has 8.5 g of protein.  Soybeans (roasted) -- 1 oz has 8 g of protein.  Soybeans (cooked) -- 1/2 cup has 11 g of protein.  Soy milk -- 1 cup (8 oz) has 5-10 g of protein.  Soy or vegetable patty -- 1 patty has 11 g of protein.  Sunflower seeds -- 1 oz has 5.5 g of protein.  Tofu (firm) -- 1/2 cup has 20 g of protein.  Tuna (canned in water) --  3 oz has 20 g of protein.  Yogurt -- 6 oz has 8 g of protein. Low-protein foods Low-protein foods contain 3 grams (3 g) or less of protein per serving. They include:  Beets (raw or cooked) -- 1/2 cup has 1.5 g of protein.  Bran cereal -- 1/2 cup has 2-3 g of protein.  Bread -- 1 slice has 2.5 g of protein.  Broccoli (raw or cooked) -- 1/2 cup has 2 g of protein.  Collard greens (raw or cooked) -- 1/2 cup has 2 g of protein.  Corn (fresh or cooked) -- 1/2 cup has 2 g of protein.  Cream cheese -- 1 oz has 2 g of protein.  Creamer (half-and-half) -- 1 oz has 1 g of protein.  Flour tortilla -- 1 tortilla has 2.5 g of protein  Frozen yogurt -- 1/2 cup has 3 g of protein.  Fruit or vegetable juice -- 1/2 cup has 1 g of protein.  Green beans (raw or cooked) --  1/2 cup has 1 g of protein.  Green peas (canned) -- 1/2 cup has 3.5 g of protein.  Muffins -- 1 small muffin (2 oz) has 3 g of protein.  Oatmeal (cooked) -- 1/2 cup has 3 g of protein.  Potato (baked with skin) -- 1 medium potato has 3 g of protein.  Rice (cooked) -- 1/2 cup has 2.5-3.5 g of protein.  Sour cream -- 1/2 cup has 2.5 g of protein.  Spinach (cooked) -- 1/2 cup has 3 g of protein.  Squash (cooked) -- 1/2 cup has 1.5 g of protein. Actual amounts of protein may be different depending on processing. Talk with your health care provider or dietitian about what foods are recommended for you. This information is not intended to replace advice given to you by your health care provider. Make sure you discuss any questions you have with your health care provider. Document Revised: 04/21/2016 Document Reviewed: 04/21/2016 Elsevier Patient Education  2020 [employer].

## 2019-12-29 NOTE — Progress Notes (Signed)
Name: Brian [name]   MRN: [id number]    DOB: 1934-05-07   Date:12/29/2019       Progress Note  Chief Complaint  Patient presents with  . Follow-up  . Weight Loss  . Anemia     Subjective:   Brian [name] is a 86 y.o. male, presents to clinic for follow up on the conditions listed above - anemia drop in H/H in the past 5 months and weight loss.   Pt is a very poor historian, I have been seeing him over about the past 8 to 9 months, he has consistently been difficult to work with to help adjust his medications and improve glycemic control with his diabetes he has come repeatedly to visits by himself and is forgetful, repeats questions, and we have often had to explain things to him multiple times and then call his wife to explain what happened during the visit or what her concerns or plan was. His last routine visit here she was unable to come with him and she sent to the note I to my exam of her concerns some of which included his weight loss. I had consulted with chronic care management and pharmacy to try and help with the patient's diabetes management with diabetes education but unfortunately had not seen any improvement in his glycemic control and eventually he did follow recommendations and go to consult with endocrinology.  Hemoglobin  Date Value Ref Range Status  12/22/2019 10.1 (L) 13.0 - 17.7 g/dL Final  11/21/2019 10.0 (L) 13.2 - 17.1 g/dL Final  08/22/2019 12.7 (L) 13.2 - 17.1 g/dL Final  05/04/2019 11.3 (L) 13.2 - 17.1 g/dL Final  11/02/2018 11.6 (L) 13.0 - 17.0 g/dL Final  Patient has a history of anemia but he did have a drop in hemoglobin from December to March office visits. Again he was a poor historian did not have any known complaints of bleeding bruising melena hematochezia hematuria, but he was losing weight and generally feeling weak and tired with decreased appetite. He did return the stool cards about a month after our visit and they were negative. Iron panel  showed no iron deficiency. He was normocytic, and other cell indices have been normal.  Patient's appointment today was to follow-up on the anemia he did have labs done last week which showed his H&H was stable 11/21/2019 hemoglobin was 10.0 12/22/2019 was 10.1. Does have a history of CKD stage III that was previously followed by Dr. Juleen China and had a history of anemia secondary to chronic renal failure, his renal function over the past several months has been stable with GFR in the 60s, I do not believe he is currently seeing nephrology.   He has aged out of colonoscopy for screening for colon cancer reported that his last colonoscopy was reported to be done in the last 10 years with history of diverticulitis. 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 Patient and his wife today state that he has no abdominal pain, bowel changes, bloating, he has not had any suspected diverticulitis no diarrhea no melena hematochezia. I cannot find any follow-up records with gastroenterology following his 2016 hospital admission, he did follow-up with nephrology and urology per the  next couple years, he did do home health and was even on hospice for short amount of time with repeated blood transfusion 1-2 times in 2016, with a baseline hemoglobin ranging 8-9. In 2017 hemoglobin hematocrit improved to have a new baseline around 12/37 -there is a gap in the labs available in this electronic medical record until 2020 when his baseline hemoglobin appears to be between 11.3 and 12.7. Iron panel done recently was normal Stool cards 12/21/2019 neg x 3 Renal function GFR 56-65, improving/increasing GFR and  decreasing sCr from Dec to April   DM has been uncontrolled - last A1C 9.8 and sugars have 200-300s.   levemir 12 units twice a day fasting CBG has improved to 100's this am was 134  Lab Results  Component Value Date   HGBA1C 9.8 (H) 11/21/2019  He was referred to and saw Dr. Chalmers Cater last year, but only one page of records are scanned into chart and they only have info about statin med on them, no DM info, dx, plan, labs etc.  Pts wife says they recently got the diabetes straightened out and when I asked how long ago that was she states it was only 2 weeks ago that his medications were adjusted and his blood sugars improved.  Weight loss-he has had gradual weight loss over the past year but much more rapid weight loss in the past 5 to 6 months down about 26 to 28 pounds in the past 6 months more than 15% weight loss.   He lost 8-10 pounds from March to April this year.  His insulin and diabetes only recently adjusted in the past 2 weeks he had office visit last week with cardiology in follow-up today with [place] and his weight had not changed in the past week.  His daughter is on the phone and his wife is present in the room and they have been trying to help him with his diet, eating more protein, try to get him to drink boost a few times a day he has a picky appetite and does not like meat in general. Wt Readings from Last 15 Encounters:  12/29/19 141 lb (64 kg)  12/22/19 141 lb (64 kg)  11/21/19 149 lb 1.6 oz (67.6 kg)  11/07/19 151 lb (68.5 kg)  10/31/19 150 lb 9.6 oz (68.3 kg)  09/15/19 167 lb (75.8 kg)  08/22/19 167 lb 3.2 oz (75.8 kg)  06/21/19 168 lb 11.2 oz (76.5 kg)  05/04/19 167 lb 9.6 oz (76 kg)  03/14/19 169 lb 14.4 oz (77.1 kg)  02/08/19 169 lb 6.4 oz (76.8 kg)  11/01/18 173 lb 1.6 oz (78.5 kg)  09/10/18 173 lb 6.4 oz (78.7 kg)  09/06/18 173 lb (78.5 kg)  08/16/18 172 lb 6.4 oz (78.2 kg)   BMI Readings from Last 5 Encounters:  12/29/19 19.67 kg/m  12/22/19 19.67 kg/m  11/21/19  20.80 kg/m  11/07/19 21.06 kg/m  10/31/19 21.61 kg/m    01/12/2020 patient is scheduled for right and left heart catheterization with Dr. Angelena Form - [facility] Sees Dr. Oval Linsey for cardiology  Further chart review through care everywhere - He has seen nephrology more recently (only phone calls and questions about meds for arthritis) and endocrinology on chart 11/2018 he had stopped seeing at [facility] last visit 11/2018, I saw him 04/2019- present, 05/2019 was referred to endocrinology again went to Dr. Chalmers Cater - will need updated records  Going to the [facility] "for his meds" per his wife.  Further  review of chart - last 2 recent cardiology visits, ECHO, EKG, labs done today prior to and after visit and a lot of labs and VS/weight reviewed with pt and wife in room today.    Patient Active Problem List   Diagnosis Date Noted  . Anemia 11/23/2019  . Congestive heart failure ([place]) 03/14/2019  . Uncontrolled type 2 diabetes mellitus with diabetic nephropathy ([place]) 02/22/2018  . Heart murmur 11/04/2017  . Erectile dysfunction 09/10/2017  . Diastolic dysfunction without heart failure 06/08/2017  . Aortic stenosis, severe 09/21/2015  . Essential hypertension 05/08/2015  . Hyperlipidemia LDL goal <100 05/08/2015  . Diverticulosis of colon 05/08/2015  . Secondary hyperparathyroidism of renal origin ([place]) 05/08/2015  . Chronic kidney disease (CKD), stage III (moderate) 05/08/2015  . Irritable bowel syndrome with constipation 05/08/2015  . Benign localized prostatic hyperplasia with lower urinary tract symptoms (LUTS) 05/08/2015    Past Surgical History:  Procedure Laterality Date  . CYSTOSCOPY/RETROGRADE/URETEROSCOPY/STONE EXTRACTION WITH BASKET N/A 03/22/2013   Procedure: CYSTOSCOPY BLADDER STONE STONE EXTRACTION;  Surgeon: [name], MD;  Location: [facility];  Service: Urology;  Laterality: N/A;  CYSTOSCOPY    . TONSILLECTOMY      Family History  Problem  Relation Age of Onset  . Hyperlipidemia Father   . Congestive Heart Failure Other   . Heart attack Other   . Heart disease Other   . Diabetes Brother     Social History   Tobacco Use  . Smoking status: Former Smoker    Packs/day: 0.75    Years: 39.00    Pack years: 29.25    Types: Cigarettes    Quit date: 03/21/1993    Years since quitting: 26.7  . Smokeless tobacco: Never Used  . Tobacco comment: smoking cessation materials not required  Substance Use Topics  . Alcohol use: No    Alcohol/week: 0.0 standard drinks  . Drug use: No      Current Outpatient Medications:  .  aspirin EC 81 MG tablet, Take 81 mg by mouth daily. Reported on 09/17/2015, Disp: , Rfl:  .  COMBIGAN 0.2-0.5 % ophthalmic solution, Apply 1 drop to eye 2 (two) times daily., Disp: , Rfl:  .  docusate sodium (DOK) 100 MG capsule, Take 1 capsule (100 mg total) by mouth 2 (two) times daily as needed., Disp: 100 capsule, Rfl: 5 .  finasteride (PROSCAR) 5 MG tablet, Take 1 tablet (5 mg total) by mouth daily., Disp: 90 tablet, Rfl: 3 .  Insulin Detemir (LEVEMIR FLEXTOUCH) 100 UNIT/ML Pen, Inject 10-20 Units into the skin daily., Disp: 15 mL, Rfl: 1 .  Omega-3 Fatty Acids (FISH OIL) 1000 MG CAPS, Take 1 capsule (1,000 mg total) by mouth daily., Disp: 90 capsule, Rfl: 3 .  quinapril (ACCUPRIL) 20 MG tablet, TAKE 1 TABLET BY MOUTH TWICE A DAY, Disp: 180 tablet, Rfl: 2 .  rosuvastatin (CRESTOR) 5 MG tablet, TAKE ONE TABLET BY MOUTH FOUR NIGHTS A WEEK, Disp: 50 tablet, Rfl: 0 .  tamsulosin (FLOMAX) 0.4 MG CAPS capsule, TAKE 1 CAPSULE BY MOUTH EVERY DAY, Disp: 30 capsule, Rfl: 5 .  ACCU-CHEK GUIDE test strip, USE UP TO 4 TIMES A DAY AS DIRECTED, Disp: 100 strip, Rfl: 0 .  Accu-Chek Softclix Lancets lancets, , Disp: , Rfl:  .  amLODipine (NORVASC) 10 MG tablet, TAKE 1 TABLET (10 MG TOTAL) BY MOUTH DAILY. (FOR HIGH BLOOD PRESSURE) (Patient not taking: Reported on 12/22/2019), Disp: 90 tablet, Rfl: 2 .  Insulin Pen Needle (B-D  UF III MINI PEN NEEDLES) 31G X 5 MM MISC, USE AS DIRECTED WITH LEVEMIR INSULIN PENS AT BEDTIME, once a day, Disp: 100 each, Rfl: 3  Allergies  Allergen Reactions  . Carvedilol Other (See Comments)    bradycardia  . Niaspan [Niacin Er]   . Shellfish Allergy Other (See Comments)    Pt vomits  . Statins Other (See Comments)    Myalgia  . Sulfa Antibiotics   . Zetia [Ezetimibe]   . Januvia [Sitagliptin] Rash    Chart Review Today: I personally reviewed active problem list, medication list, allergies, family history, social history, health maintenance, notes from last encounter, lab results, imaging with the patient/caregiver today. Other review as noted in HPI  Review of Systems  10 Systems reviewed and are negative for acute change except as noted in the HPI.  Objective:    Vitals:   12/29/19 0935  BP: 130/62  Pulse: (!) 104  Resp: 14  Temp: 97.6 F (36.4 C)  SpO2: 97%  Weight: 141 lb (64 kg)  Height: 5\' 11"  (1.803 m)    Body mass index is 19.67 kg/m.  Physical Exam Vitals and nursing note reviewed.  Constitutional:      General: He is not in acute distress.    Appearance: Normal appearance. He is not toxic-appearing or diaphoretic.  HENT:     Head: Normocephalic and atraumatic.     Right Ear: External ear normal.     Left Ear: External ear normal.     Mouth/Throat:     Mouth: Mucous membranes are moist.  Eyes:     General:        Right eye: Discharge present.        Left eye: No discharge.     Conjunctiva/sclera: Conjunctivae normal.  Cardiovascular:     Rate and Rhythm: Regular rhythm. Tachycardia present.     Pulses: Normal pulses.     Heart sounds: Murmur present. No friction rub. No gallop.   Pulmonary:     Effort: Pulmonary effort is normal. No respiratory distress.     Breath sounds: Normal breath sounds. No stridor. No wheezing.  Abdominal:     General: Bowel sounds are normal. There is no distension.     Palpations: Abdomen is soft.      Tenderness: There is no abdominal tenderness.  Musculoskeletal:     Cervical back: Normal range of motion.  Skin:    General: Skin is warm.     Coloration: Skin is not pale.     Comments: Large nodule to midline of his mid thoracic back, nontender and somewhat fluctuant, roughly 5 x 4 cm in diameter and raised at least 2 cm, no erythema, some hyperpigmentation and open comedones scattered over area   Neurological:     Mental Status: He is alert. Mental status is at baseline.     Motor: No weakness.  Psychiatric:        Mood and Affect: Mood normal.        PHQ2/9: Depression screen [facility] 2/9 12/29/2019 11/21/2019 09/15/2019 08/22/2019 08/17/2019  Decreased Interest 0 0 0 0 0  Down, Depressed, Hopeless 0 0 0 0 0  PHQ - 2 Score 0 0 0 0 0  Altered sleeping 0 0 - 0 0  Tired, decreased energy 0 0 - 0 0  Change in appetite 0 0 - 0 0  Feeling bad or failure about yourself  0 0 - 0 0  Trouble concentrating 0 0 - 0 0  Moving slowly or fidgety/restless 0 0 - 0 0  Suicidal thoughts 0 0 - 0 0  PHQ-9 Score 0 0 - 0 0  Difficult doing work/chores Not difficult at all Not difficult at all - Not difficult at all Not difficult at all  Some recent data might be hidden    phq 9 is neg, reviewed  Fall Risk: Fall Risk  12/29/2019 11/21/2019 09/15/2019 08/22/2019 08/17/2019  Falls in the past year? 1 1 1 1 1   Number falls in past yr: 1 1 0 1 0  Comment - - - - 2-3 month ago getting off bus.  Injury with Fall? 0 0 0 0 0  Risk for fall due to : History of fall(s);Impaired mobility;Impaired balance/gait - - - -  Risk for fall due to: Comment - - - - -  Follow up - - Falls prevention discussed - -    Functional Status Survey: Is the patient deaf or have difficulty hearing?: No Does the patient have difficulty seeing, even when wearing glasses/contacts?: No Does the patient have difficulty concentrating, remembering, or making decisions?: No Does the patient have difficulty walking or climbing stairs?:  No Does the patient have difficulty dressing or bathing?: No Does the patient have difficulty doing errands alone such as visiting a doctor's office or shopping?: No   Assessment & Plan:     ICD-10-CM   1. Unintentional weight loss  R63.4    gradual from 2019 to present, but rapid concerning weight loss over the past 3-5 months, none in the past week with improved DM/sugar control  2. Malnutrition, unspecified type ([place])  E46    26 lb weight loss from Jan 2020 to April - >15% of weight, likely due to uncontrolled DM, but other factors as well - severe AS, fatigue, decrease appetite  3. Aortic stenosis, severe  I35.0    Patient generally very fatigued, seeing cardiology and CT surgery, has been tachycardic over the past 5 months blood pressure appears stable  4. Anemia, unspecified type  D64.9 Iron, TIBC and Ferritin Panel   improving anemia, neg stool cards, will continue to monitor  5. Stage 3a chronic kidney disease  N18.31    GFR 55-65 stage 3 to 2, previously seeing nephrology - will need to request records for last consult, they were managing/monitoring DM CKD 2nd HPT, anemia   6. Type 2 diabetes mellitus with stage 3 chronic kidney disease, with long-term current use of insulin, unspecified whether stage 3a or 3b CKD ([place])  E11.22    N18.30    Z79.4    uncontrolled, pt difficult for months, here by himself/confused and non-compliant, now seeing endo and meds have been adjusted, seems to be improving  7. Congestive heart failure, unspecified HF chronicity, unspecified heart failure type [facility]) Chronic I50.9    seeing cardiology - scheduled for heart cath this month and planning to get TAVR - no fluid overload- he appears euvolemic  8. Secondary hyperparathyroidism of renal origin [facility]) Chronic N25.81    secondary to CKD stage 3 - seeing [place]   Anemia -patient has history of anemia secondary to renal disease, hemoglobin did decrease from December 2020 to March 2021 from hemoglobin of  12.7 down to 10, normocytic anemia, iron panel was done in December and was normal. Patient in April brought in stool cards just prior to a follow-up visit -stool cards x3 were negative His labs repeated by another provider April 29 showed stable hemoglobin went from 10.0-10.1 and with  negative stool cards there is no concern for GI blood loss.    We will continue to monitor his anemia  He did have significant weight loss over years and very rapidly over the past several months.  I do believe a large part of this is due to hyperglycemia weight loss from poorly controlled diabetes despite multiple attempts to help him adjust his medications he was noncompliant and resistant to any med changes with the several times I saw him over the past 6 to 9 months, we did consult chronic care management team including nurse educator and pharmacist and only recently did he establish with endocrinology who has adjusted his medications now his blood sugars are well controlled and it does seem that his weight loss has stabilized with well-controlled blood sugars suspect that he will not continue to lose weight.  There is other factors including his significant valvular heart disease, CHF, chronic kidney disease, multiple chronic conditions which may contribute to gradually decreasing function and weight loss.  In the past patient was on hospice - but his overall health improved and has been pretty good for the past couple years.  Pts care here at our clinic has been recently complicated with changes of providers multiple times in the last year in addition to Covid pandemic changes in office visits and availability to see patients in person due to restrictions and surges in cases.  Patient has mostly come many times on his own and has been confused for most of his recent visits we have seen him by himself but also called his wife to explain everything that happened and she has sent in notes.  Today's the first day have been  able to meet her at his office visit.  We did review his chart extensively today, reviewed other dx, OV and procedures with specialists, and upcoming visits/procedures etc  Return for 2 month follow up on anemia and weight .   Delsa Grana, PA-C 12/29/19 9:57 AM

## 2020-01-10 ENCOUNTER — Other Ambulatory Visit (HOSPITAL_COMMUNITY)
Admission: RE | Admit: 2020-01-10 | Discharge: 2020-01-10 | Disposition: A | Discharge status: Home / Self Care | Payer: Medicare PPO | Source: Ambulatory Visit | Attending: Cardiovascular Disease | Admitting: Cardiovascular Disease

## 2020-01-10 ENCOUNTER — Telehealth: Payer: Self-pay | Admitting: *Deleted

## 2020-01-10 DIAGNOSIS — Z01812 Encounter for preprocedural laboratory examination: Secondary | ICD-10-CM | POA: Diagnosis present

## 2020-01-10 DIAGNOSIS — Z20822 Contact with and (suspected) exposure to covid-19: Secondary | ICD-10-CM | POA: Insufficient documentation

## 2020-01-10 LAB — SARS CORONAVIRUS 2 (TAT 6-24 HRS): SARS Coronavirus 2: NEGATIVE

## 2020-01-10 NOTE — Telephone Encounter (Signed)
Pt contacted pre-catheterization scheduled at [facility] for: Thursday Jan 12, 2020 12 noon Verified arrival time and place: [facility]) at: 10 AM   No solid food after midnight prior to cath, clear liquids until 5 AM day of procedure.  Hold: Insulin-AM of procedure/1/2 HS prior to procedure  Except hold medications AM meds can be  taken pre-cath with sip of water including: ASA 81 mg   Confirmed patient has responsible adult to drive home post procedure and observe 24 hours after arriving home: yes  You are allowed ONE visitor in the waiting room during your procedure. Both you and your visitor must wear masks.      COVID-19 Pre-Screening Questions:  . In the past 7 to 10 days have you had a cough,  shortness of breath, headache, congestion, fever (100 or greater) body aches, chills, sore throat, or sudden loss of taste or sense of smell? no . Have you been around anyone with known Covid 19 in the past 7 to 10 days? no . Have you been around anyone who is awaiting Covid 19 test results in the past 7 to 10 days? no . Have you been around anyone who has mentioned symptoms of Covid 19 within the past 7 to 10 days? no  Reviewed procedure/mask/visitor instructions, COVID-19 screening questions with patient's wife (DPR).

## 2020-01-12 ENCOUNTER — Other Ambulatory Visit: Payer: Self-pay

## 2020-01-12 ENCOUNTER — Ambulatory Visit (HOSPITAL_COMMUNITY)
Admission: RE | Admit: 2020-01-12 | Discharge: 2020-01-12 | Disposition: A | Discharge status: Home / Self Care | Payer: Medicare PPO | Attending: Cardiovascular Disease | Admitting: Cardiovascular Disease

## 2020-01-12 ENCOUNTER — Encounter (HOSPITAL_COMMUNITY)
Admission: RE | Disposition: A | Discharge status: Home / Self Care | Payer: Medicare PPO | Source: Home / Self Care | Attending: Cardiovascular Disease

## 2020-01-12 DIAGNOSIS — I5032 Chronic diastolic (congestive) heart failure: Secondary | ICD-10-CM | POA: Insufficient documentation

## 2020-01-12 DIAGNOSIS — I130 Hypertensive heart and chronic kidney disease with heart failure and stage 1 through stage 4 chronic kidney disease, or unspecified chronic kidney disease: Secondary | ICD-10-CM | POA: Insufficient documentation

## 2020-01-12 DIAGNOSIS — Z87891 Personal history of nicotine dependence: Secondary | ICD-10-CM | POA: Diagnosis not present

## 2020-01-12 DIAGNOSIS — Z7982 Long term (current) use of aspirin: Secondary | ICD-10-CM | POA: Insufficient documentation

## 2020-01-12 DIAGNOSIS — E785 Hyperlipidemia, unspecified: Secondary | ICD-10-CM | POA: Insufficient documentation

## 2020-01-12 DIAGNOSIS — I350 Nonrheumatic aortic (valve) stenosis: Secondary | ICD-10-CM | POA: Insufficient documentation

## 2020-01-12 DIAGNOSIS — D631 Anemia in chronic kidney disease: Secondary | ICD-10-CM | POA: Diagnosis not present

## 2020-01-12 DIAGNOSIS — Z794 Long term (current) use of insulin: Secondary | ICD-10-CM | POA: Insufficient documentation

## 2020-01-12 DIAGNOSIS — N183 Chronic kidney disease, stage 3 unspecified: Secondary | ICD-10-CM | POA: Diagnosis not present

## 2020-01-12 DIAGNOSIS — E1122 Type 2 diabetes mellitus with diabetic chronic kidney disease: Secondary | ICD-10-CM | POA: Insufficient documentation

## 2020-01-12 DIAGNOSIS — Z888 Allergy status to other drugs, medicaments and biological substances status: Secondary | ICD-10-CM | POA: Diagnosis not present

## 2020-01-12 DIAGNOSIS — I2510 Atherosclerotic heart disease of native coronary artery without angina pectoris: Secondary | ICD-10-CM | POA: Insufficient documentation

## 2020-01-12 DIAGNOSIS — Z882 Allergy status to sulfonamides status: Secondary | ICD-10-CM | POA: Diagnosis not present

## 2020-01-12 DIAGNOSIS — Z79899 Other long term (current) drug therapy: Secondary | ICD-10-CM | POA: Diagnosis not present

## 2020-01-12 HISTORY — PX: RIGHT/LEFT HEART CATH AND CORONARY ANGIOGRAPHY: CATH118266

## 2020-01-12 LAB — POCT I-STAT 7, (LYTES, BLD GAS, ICA,H+H)
Acid-base deficit: 1 mmol/L (ref 0.0–2.0)
Bicarbonate: 23.6 mmol/L (ref 20.0–28.0)
Calcium, Ion: 1.28 mmol/L (ref 1.15–1.40)
HCT: 30 % — ABNORMAL LOW (ref 39.0–52.0)
Hemoglobin: 10.2 g/dL — ABNORMAL LOW (ref 13.0–17.0)
O2 Saturation: 100 %
Potassium: 3.9 mmol/L (ref 3.5–5.1)
Sodium: 139 mmol/L (ref 135–145)
TCO2: 25 mmol/L (ref 22–32)
pCO2 arterial: 36.1 mmHg (ref 32.0–48.0)
pH, Arterial: 7.424 (ref 7.350–7.450)
pO2, Arterial: 195 mmHg — ABNORMAL HIGH (ref 83.0–108.0)

## 2020-01-12 LAB — POCT I-STAT EG7
Acid-base deficit: 1 mmol/L (ref 0.0–2.0)
Bicarbonate: 24 mmol/L (ref 20.0–28.0)
Calcium, Ion: 1.17 mmol/L (ref 1.15–1.40)
HCT: 28 % — ABNORMAL LOW (ref 39.0–52.0)
Hemoglobin: 9.5 g/dL — ABNORMAL LOW (ref 13.0–17.0)
O2 Saturation: 73 %
Potassium: 3.6 mmol/L (ref 3.5–5.1)
Sodium: 141 mmol/L (ref 135–145)
TCO2: 25 mmol/L (ref 22–32)
pCO2, Ven: 38.8 mmHg — ABNORMAL LOW (ref 44.0–60.0)
pH, Ven: 7.4 (ref 7.250–7.430)
pO2, Ven: 39 mmHg (ref 32.0–45.0)

## 2020-01-12 LAB — GLUCOSE, CAPILLARY
Glucose-Capillary: 144 mg/dL — ABNORMAL HIGH (ref 70–99)
Glucose-Capillary: 174 mg/dL — ABNORMAL HIGH (ref 70–99)

## 2020-01-12 SURGERY — RIGHT/LEFT HEART CATH AND CORONARY ANGIOGRAPHY
Anesthesia: LOCAL

## 2020-01-12 MED ORDER — SODIUM CHLORIDE 0.9 % WEIGHT BASED INFUSION: 1.0000 mL/kg/h | INTRAVENOUS | Status: DC

## 2020-01-12 MED ORDER — SODIUM CHLORIDE 0.9 % IV SOLN: 250.0000 mL | INTRAVENOUS | Status: DC | PRN

## 2020-01-12 MED ORDER — LIDOCAINE HCL (PF) 1 % IJ SOLN
INTRAMUSCULAR | Status: DC | PRN
  Administered 2020-01-12: 5 mL

## 2020-01-12 MED ORDER — SODIUM CHLORIDE 0.9 % IV SOLN: INTRAVENOUS | Status: AC

## 2020-01-12 MED ORDER — FENTANYL CITRATE (PF) 100 MCG/2ML IJ SOLN
INTRAMUSCULAR | Status: DC | PRN
  Administered 2020-01-12: 12.5 ug via INTRAVENOUS

## 2020-01-12 MED ORDER — ACETAMINOPHEN 325 MG PO TABS: 650.0000 mg | ORAL_TABLET | ORAL | Status: DC | PRN

## 2020-01-12 MED ORDER — SODIUM CHLORIDE 0.9% FLUSH: 3.0000 mL | INTRAVENOUS | Status: DC | PRN

## 2020-01-12 MED ORDER — HYDRALAZINE HCL 20 MG/ML IJ SOLN: 10.0000 mg | INTRAMUSCULAR | Status: DC | PRN

## 2020-01-12 MED ORDER — MIDAZOLAM HCL 2 MG/2ML IJ SOLN
INTRAMUSCULAR | Status: AC
  Filled 2020-01-12: qty 2

## 2020-01-12 MED ORDER — FENTANYL CITRATE (PF) 100 MCG/2ML IJ SOLN
INTRAMUSCULAR | Status: AC
  Filled 2020-01-12: qty 2

## 2020-01-12 MED ORDER — SODIUM CHLORIDE 0.9 % WEIGHT BASED INFUSION
3.0000 mL/kg/h | INTRAVENOUS | Status: AC
  Administered 2020-01-12: 3 mL/kg/h via INTRAVENOUS

## 2020-01-12 MED ORDER — ONDANSETRON HCL 4 MG/2ML IJ SOLN: 4.0000 mg | Freq: Four times a day (QID) | INTRAMUSCULAR | Status: DC | PRN

## 2020-01-12 MED ORDER — HEPARIN SODIUM (PORCINE) 1000 UNIT/ML IJ SOLN
INTRAMUSCULAR | Status: AC
  Filled 2020-01-12: qty 1

## 2020-01-12 MED ORDER — ASPIRIN 81 MG PO CHEW: 81.0000 mg | CHEWABLE_TABLET | ORAL | Status: DC

## 2020-01-12 MED ORDER — VERAPAMIL HCL 2.5 MG/ML IV SOLN
INTRAVENOUS | Status: DC | PRN
  Administered 2020-01-12: 10 mL via INTRA_ARTERIAL

## 2020-01-12 MED ORDER — HEPARIN (PORCINE) IN NACL 1000-0.9 UT/500ML-% IV SOLN
INTRAVENOUS | Status: AC
  Filled 2020-01-12: qty 1000

## 2020-01-12 MED ORDER — HEPARIN (PORCINE) IN NACL 1000-0.9 UT/500ML-% IV SOLN
INTRAVENOUS | Status: DC | PRN
  Administered 2020-01-12 (×2): 500 mL

## 2020-01-12 MED ORDER — MIDAZOLAM HCL 2 MG/2ML IJ SOLN
INTRAMUSCULAR | Status: DC | PRN
  Administered 2020-01-12: 0.5 mg via INTRAVENOUS

## 2020-01-12 MED ORDER — HEPARIN SODIUM (PORCINE) 1000 UNIT/ML IJ SOLN
INTRAMUSCULAR | Status: DC | PRN
  Administered 2020-01-12: 3500 [IU] via INTRAVENOUS

## 2020-01-12 MED ORDER — SODIUM CHLORIDE 0.9% FLUSH: 3.0000 mL | Freq: Two times a day (BID) | INTRAVENOUS | Status: DC

## 2020-01-12 MED ORDER — VERAPAMIL HCL 2.5 MG/ML IV SOLN
INTRAVENOUS | Status: AC
  Filled 2020-01-12: qty 4

## 2020-01-12 MED ORDER — IOHEXOL 350 MG/ML SOLN
INTRAVENOUS | Status: DC | PRN
  Administered 2020-01-12: 85 mL

## 2020-01-12 MED ORDER — LIDOCAINE HCL (PF) 1 % IJ SOLN
INTRAMUSCULAR | Status: AC
  Filled 2020-01-12: qty 30

## 2020-01-12 SURGICAL SUPPLY — 11 items

## 2020-01-12 NOTE — Interval H&P Note (Signed)
History and Physical Interval Note:  01/12/2020 1:11 PM  Brian [name]  has presented today for surgery, with the diagnosis of aortic stenosis.  The various methods of treatment have been discussed with the patient and family. After consideration of risks, benefits and other options for treatment, the patient has consented to  Procedure(s): RIGHT/LEFT HEART CATH AND CORONARY ANGIOGRAPHY (N/A) as a surgical intervention.  The patient's history has been reviewed, patient examined, no change in status, stable for surgery.  I have reviewed the patient's chart and labs.  Questions were answered to the patient's satisfaction.    Cath Lab Visit (complete for each Cath Lab visit)  Clinical Evaluation Leading to the Procedure:   ACS: No.  Non-ACS:    Anginal Classification: CCS II  Anti-ischemic medical therapy: Minimal Therapy (1 class of medications)  Non-Invasive Test Results: No non-invasive testing performed  Prior CABG: No previous CABG        Lauree Chandler

## 2020-01-12 NOTE — Research (Signed)
[place] Informed Consent   Subject Name: Brian [name]  Subject met inclusion and exclusion criteria.  The informed consent form, study requirements and expectations were reviewed with the subject and questions and concerns were addressed prior to the signing of the consent form.  The subject verbalized understanding of the trail requirements.  The subject agreed to participate in the [facility] trial and signed the informed consent.  The informed consent was obtained prior to performance of any protocol-specific procedures for the subject.  A copy of the signed informed consent was given to the subject and a copy was placed in the subject's medical record.  [name]. 01/12/2020, 11:05am

## 2020-01-12 NOTE — Discharge Instructions (Signed)
Radial Site Care  This sheet gives you information about how to care for yourself after your procedure. Your health care provider may also give you more specific instructions. If you have problems or questions, contact your health care provider. What can I expect after the procedure? After the procedure, it is common to have:  Bruising and tenderness at the catheter insertion area. Follow these instructions at home: Medicines  Take over-the-counter and prescription medicines only as told by your health care provider. Insertion site care  Follow instructions from your health care provider about how to take care of your insertion site. Make sure you: ? Wash your hands with soap and water before you change your bandage (dressing). If soap and water are not available, use hand sanitizer. ? Change your dressing as told by your health care provider. ? Leave stitches (sutures), skin glue, or adhesive strips in place. These skin closures may need to stay in place for 2 weeks or longer. If adhesive strip edges start to loosen and curl up, you may trim the loose edges. Do not remove adhesive strips completely unless your health care provider tells you to do that.  Check your insertion site every day for signs of infection. Check for: ? Redness, swelling, or pain. ? Fluid or blood. ? Pus or a bad smell. ? Warmth.  Do not take baths, swim, or use a hot tub until your health care provider approves.  You may shower 24-48 hours after the procedure, or as directed by your health care provider. ? Remove the dressing and gently wash the site with plain soap and water. ? Pat the area dry with a clean towel. ? Do not rub the site. That could cause bleeding.  Do not apply powder or lotion to the site. Activity   For 24 hours after the procedure, or as directed by your health care provider: ? Do not flex or bend the affected arm. ? Do not push or pull heavy objects with the affected arm. ? Do not  drive yourself home from the hospital or clinic. You may drive 24 hours after the procedure unless your health care provider tells you not to. ? Do not operate machinery or power tools.  Do not lift anything that is heavier than 10 lb (4.5 kg), or the limit that you are told, until your health care provider says that it is safe.  Ask your health care provider when it is okay to: ? Return to work or school. ? Resume usual physical activities or sports. ? Resume sexual activity. General instructions  If the catheter site starts to bleed, raise your arm and put firm pressure on the site. If the bleeding does not stop, get help right away. This is a medical emergency.  If you went home on the same day as your procedure, a responsible adult should be with you for the first 24 hours after you arrive home.  Keep all follow-up visits as told by your health care provider. This is important. Contact a health care provider if:  You have a fever.  You have redness, swelling, or yellow drainage around your insertion site. Get help right away if:  You have unusual pain at the radial site.  The catheter insertion area swells very fast.  The insertion area is bleeding, and the bleeding does not stop when you hold steady pressure on the area.  Your arm or hand becomes pale, cool, tingly, or numb. These symptoms may represent a serious problem   that is an emergency. Do not wait to see if the symptoms will go away. Get medical help right away. Call your local emergency services (911 in the [place] Do not drive yourself to the hospital. Summary  After the procedure, it is common to have bruising and tenderness at the site.  Follow instructions from your health care provider about how to take care of your radial site wound. Check the wound every day for signs of infection.  Do not lift anything that is heavier than 10 lb (4.5 kg), or the limit that you are told, until your health care provider says  that it is safe. This information is not intended to replace advice given to you by your health care provider. Make sure you discuss any questions you have with your health care provider. Document Revised: 09/16/2017 Document Reviewed: 09/16/2017 [employer] Patient Education  2020 [employer]  

## 2020-01-13 ENCOUNTER — Other Ambulatory Visit: Payer: Self-pay

## 2020-01-13 MED ORDER — METOPROLOL TARTRATE 50 MG PO TABS: ORAL_TABLET | ORAL | 0 refills | Status: DC

## 2020-01-18 ENCOUNTER — Other Ambulatory Visit: Payer: Self-pay

## 2020-01-18 ENCOUNTER — Ambulatory Visit (HOSPITAL_COMMUNITY)
Admit: 2020-01-18 | Discharge: 2020-01-18 | Disposition: A | Discharge status: Home / Self Care | Payer: Medicare PPO | Attending: Cardiovascular Disease | Admitting: Cardiovascular Disease

## 2020-01-18 ENCOUNTER — Institutional Professional Consult (permissible substitution) (INDEPENDENT_AMBULATORY_CARE_PROVIDER_SITE_OTHER): Payer: Medicare PPO | Admitting: Thoracic Surgery (Cardiothoracic Vascular Surgery)

## 2020-01-18 ENCOUNTER — Encounter: Payer: Self-pay | Admitting: Thoracic Surgery (Cardiothoracic Vascular Surgery)

## 2020-01-18 ENCOUNTER — Ambulatory Visit (HOSPITAL_BASED_OUTPATIENT_CLINIC_OR_DEPARTMENT_OTHER)
Admit: 2020-01-18 | Discharge: 2020-01-18 | Disposition: A | Discharge status: Home / Self Care | Payer: Medicare PPO | Attending: Cardiovascular Disease | Admitting: Cardiovascular Disease

## 2020-01-18 ENCOUNTER — Ambulatory Visit (HOSPITAL_COMMUNITY)
Admission: RE | Admit: 2020-01-18 | Discharge: 2020-01-18 | Disposition: A | Discharge status: Home / Self Care | Payer: Medicare PPO | Source: Ambulatory Visit | Attending: Cardiovascular Disease | Admitting: Cardiovascular Disease

## 2020-01-18 VITALS — BP 123/70 | HR 71 | Temp 97.8°F | Resp 20 | Ht 71.0 in | Wt 141.0 lb

## 2020-01-18 DIAGNOSIS — I350 Nonrheumatic aortic (valve) stenosis: Secondary | ICD-10-CM

## 2020-01-18 MED ORDER — IOHEXOL 350 MG/ML SOLN
100.0000 mL | Freq: Once | INTRAVENOUS | Status: AC | PRN
  Administered 2020-01-18: 100 mL via INTRAVENOUS

## 2020-01-18 NOTE — Patient Instructions (Signed)
   Continue taking all current medications without change through the day before surgery.  Make sure to bring all of your medications with you when you come for your Pre-Admission Testing appointment at [facility]-Stay [facility].  Have nothing to eat or drink after midnight the night before surgery.  On the morning of surgery do not take any medications  At your appointment for Pre-Admission Testing at the [facility]Stay [facility] you will be asked to sign permission forms for your upcoming surgery.  By definition your signature on these forms implies that you and/or your designee provide full informed consent for your planned surgical procedure(s), that alternative treatment options have been discussed, that you understand and accept any and all potential risks, and that you have some understanding of what to expect for your post-operative convalescence.  For any major cardiac surgical procedure potential operative risks include but are not limited to at least some risk of death, stroke or other neurologic complication, myocardial infarction, congestive heart failure, respiratory failure, renal failure, bleeding requiring blood transfusion and/or reexploration, irregular heart rhythm, heart block or bradycardia requiring permanent pacemaker, pneumonia, pericardial effusion, pleural effusion, wound infection, pulmonary embolus or other thromboembolic complication, chronic pain, or other complications related to the specific procedure(s) performed.  For transcatheter aortic valve replacement additional risks include but are not limited to risk of paravalvular leak, valve embolization, valve thrombosis, aortic dissection, aortic rupture, ventricular septal defect or perforation, pericardial tamponade, injury of the abdominal aorta or its branches, and/or injury or occlusion of the arteries going to your arms or legs.  Please call to schedule a follow-up  appointment in our office prior to surgery if you have any unresolved questions about your planned surgical procedure, the associated risks, alternative treatment options, and/or expectations for your post-operative recovery.      

## 2020-01-18 NOTE — H&P (View-Only) (Signed)
[facility]  MULTIDISCIPLINARY HEART VALVE CLINIC  CARDIOTHORACIC SURGERY CONSULTATION REPORT  Referring Provider is Skeet Latch, MD PCP is Delsa Grana, PA-C  Chief Complaint  Patient presents with   Aortic Stenosis    Surgical eval for TAVR, review all studies     HPI:  Patient is an 86 year old African-American male with history of aortic stenosis, hypertension, chronic diastolic congestive heart failure, hyperlipidemia, stage III chronic kidney disease, and insulin-dependent type 2 diabetes mellitus who has been referred for surgical consultation to discuss treatment options for management of severe symptomatic aortic stenosis.  Patient has been followed for several years by Dr. Oval Linsey with known history of aortic stenosis and hypertension.  Previous echocardiograms have documented the presence of normal left ventricular systolic function with aortic stenosis that has slowly progressed in severity.  Over the past year the patient has developed increased tendency to get fatigued with decreased exercise tolerance and weight loss.  Recent follow-up echocardiogram revealed significant progression of aortic stenosis with peak velocity across aortic valve measured greater than 3.3 m/s corresponding to mean transvalvular gradient estimated 37 mmHg.  DVI was reported 0.32.  Left ventricular systolic function remain normal.  The patient was referred to the multidisciplinary heart valve clinic and has been evaluated previously by Dr. Angelena Form.  Catheterization revealed mild to moderate nonobstructive coronary artery disease.  Mean transvalvular gradient across aortic valve was reported 25 mmHg.  Right heart pressures were normal.  CT angiography was performed and the patient was referred for surgical consultation.  Patient is married and lives locally in [place] with his wife who is a former Psychologist, sport and exercise.  Patient has remained physically active and  functionally independent all of his adult life.  He enjoys gardening and doing other chores around the house.  He has been noted to have decreased exercise tolerance with increased fatigue.  He has had some intermittent shortness of breath with exertion although he denies any exertional shortness of breath presently.  He has never had any chest pain or chest tightness.  He denies resting shortness of breath, PND, orthopnea, or lower extremity edema.  He has not had palpitations, dizzy spells, or syncope.  He reports good appetite but he has lost approximately 30 pounds in weight.  He is reported to have developed some problems with short-term memory loss.  Past Medical History:  Diagnosis Date   Anemia of chronic renal failure 05/08/2015   Anemia of chronic renal failure 05/08/2015   Aortic stenosis, mild 09/21/2015   Aortic stenosis, severe 09/21/2015   Arthritis    Bladder stone    Chronic kidney disease (CKD), stage III (moderate) 05/08/2015   Followed by Dr. Rolly Salter    Congestive heart failure ([place]) [id number]   Diastolic dysfunction without heart failure 06/08/2017   Essential hypertension 05/08/2015   History of bladder stone    Hyperlipidemia    Hypertension    Nocturia    Renal disorder    Type 2 diabetes mellitus ([place])     Past Surgical History:  Procedure Laterality Date   CYSTOSCOPY/RETROGRADE/URETEROSCOPY/STONE EXTRACTION WITH BASKET N/A 03/22/2013   Procedure: CYSTOSCOPY BLADDER STONE STONE EXTRACTION;  Surgeon: [name], MD;  Location: [facility];  Service: Urology;  Laterality: N/A;  CYSTOSCOPY     RIGHT/LEFT HEART CATH AND CORONARY ANGIOGRAPHY N/A 01/12/2020   Procedure: RIGHT/LEFT HEART CATH AND CORONARY ANGIOGRAPHY;  Surgeon: Burnell Blanks, MD;  Location: [facility];  Service: Cardiovascular;  Laterality:  N/A;   TONSILLECTOMY      Family History  Problem Relation Age of Onset   Hyperlipidemia Father     Congestive Heart Failure Other    Heart attack Other    Heart disease Other    Diabetes Brother     Social History   Socioeconomic History   Marital status: Married    Spouse name: Brian [name]   Number of children: 4   Years of education: some college   Highest education level: 12th grade  Occupational History   Occupation: Engineer, manufacturing systems  Tobacco Use   Smoking status: Former Smoker    Packs/day: 0.75    Years: 39.00    Pack years: 29.25    Types: Cigarettes    Quit date: 03/21/1993    Years since quitting: 26.8   Smokeless tobacco: Never Used   Tobacco comment: smoking cessation materials not required  Substance and Sexual Activity   Alcohol use: No    Alcohol/week: 0.0 standard drinks   Drug use: No   Sexual activity: Yes  Other Topics Concern   Not on file  Social History Narrative   Lives with wife in [place], [place].   Social Determinants of Health   Financial Resource Strain: Low Risk    Difficulty of Paying Living Expenses: Not very hard  Food Insecurity: No Food Insecurity   Worried About Charity fundraiser in the Last Year: Never true   Ran Out of Food in the Last Year: Never true  Transportation Needs: No Transportation Needs   Lack of Transportation (Medical): No   Lack of Transportation (Non-Medical): No  Physical Activity: Inactive   Days of Exercise per Week: 0 days   Minutes of Exercise per Session: 0 min  Stress: No Stress Concern Present   Feeling of Stress : Not at all  Social Connections: Slightly Isolated   Frequency of Communication with Friends and Family: More than three times a week   Frequency of Social Gatherings with Friends and Family: More than three times a week   Attends Religious Services: More than 4 times per year   Active Member of [employer] or Organizations: No   Attends Archivist Meetings: Never   Marital Status: Married  Human resources officer Violence: Not At Risk   Fear of Current or  Ex-Partner: No   Emotionally Abused: No   Physically Abused: No   Sexually Abused: No    Current Outpatient Medications  Medication Sig Dispense Refill   ACCU-CHEK GUIDE test strip USE UP TO 4 TIMES A DAY AS DIRECTED 100 strip 0   Accu-Chek Softclix Lancets lancets      aspirin EC 81 MG tablet Take 81 mg by mouth daily. Reported on 09/17/2015     COMBIGAN 0.2-0.5 % ophthalmic solution Place 1 drop into both eyes 2 (two) times daily.      docusate sodium (DOK) 100 MG capsule Take 1 capsule (100 mg total) by mouth 2 (two) times daily as needed. 100 capsule 5   finasteride (PROSCAR) 5 MG tablet Take 1 tablet (5 mg total) by mouth daily. 90 tablet 3   Insulin Detemir (LEVEMIR FLEXTOUCH) 100 UNIT/ML Pen Inject 10-20 Units into the skin daily. (Patient taking differently: Inject 12 Units into the skin 2 (two) times daily. ) 15 mL 1   Insulin Pen Needle (B-D UF III MINI PEN NEEDLES) 31G X 5 MM MISC USE AS DIRECTED WITH LEVEMIR INSULIN PENS AT BEDTIME, once a day 100 each 3  Omega-3 Fatty Acids (FISH OIL) 1000 MG CAPS Take 1 capsule (1,000 mg total) by mouth daily. 90 capsule 3   quinapril (ACCUPRIL) 20 MG tablet TAKE 1 TABLET BY MOUTH TWICE A DAY (Patient taking differently: 20 mg daily. ) 180 tablet 2   rosuvastatin (CRESTOR) 5 MG tablet TAKE ONE TABLET BY MOUTH FOUR NIGHTS A WEEK (Patient taking differently: Take 5 mg by mouth See admin instructions. TAKE ONE TABLET BY MOUTH FOUR NIGHTS A WEEK) 50 tablet 0   tamsulosin (FLOMAX) 0.4 MG CAPS capsule TAKE 1 CAPSULE BY MOUTH EVERY DAY (Patient taking differently: Take 0.4 mg by mouth daily. ) 30 capsule 5   amLODipine (NORVASC) 10 MG tablet TAKE 1 TABLET (10 MG TOTAL) BY MOUTH DAILY. (FOR HIGH BLOOD PRESSURE) (Patient not taking: Reported on 01/18/2020) 90 tablet 2   No current facility-administered medications for this visit.    Allergies  Allergen Reactions   Carvedilol Other (See Comments)    bradycardia   Niaspan [Niacin Er]     Shellfish Allergy Other (See Comments)    Pt vomits   Statins Other (See Comments)    Myalgia   Sulfa Antibiotics    Zetia [Ezetimibe]    Januvia [Sitagliptin] Rash      Review of Systems:   General:  normal appetite, decreased energy, no weight gain, + weight loss, no fever  Cardiac:  no chest pain with exertion, no chest pain at rest, + intermittent SOB with exertion, no resting SOB, no PND, no orthopnea, no palpitations, no arrhythmia, no atrial fibrillation, no LE edema, no dizzy spells, no syncope  Respiratory:  no shortness of breath, no home oxygen, no productive cough, no dry cough, no bronchitis, no wheezing, no hemoptysis, no asthma, no pain with inspiration or cough, no sleep apnea, no CPAP at night  GI:   no difficulty swallowing, no reflux, no frequent heartburn, no hiatal hernia, no abdominal pain, no constipation, no diarrhea, no hematochezia, no hematemesis, no melena  GU:   no dysuria,  no frequency, no urinary tract infection, no hematuria, no enlarged prostate, + kidney stones, + kidney disease  Vascular:  no pain suggestive of claudication, no pain in feet, no leg cramps, no varicose veins, no DVT, no non-healing foot ulcer  Neuro:   no stroke, no TIA's, no seizures, no headaches, no temporary blindness one eye,  no slurred speech, no peripheral neuropathy, no chronic pain, no instability of gait, + memory/cognitive dysfunction  Musculoskeletal: + arthritis, no joint swelling, no myalgias, no difficulty walking, normal mobility   Skin:   no rash, no itching, no skin infections, no pressure sores or ulcerations  Psych:   no anxiety, no depression, no nervousness, no unusual recent stress  Eyes:   no blurry vision, no floaters, no recent vision changes, does not wear glasses or contacts  ENT:   no hearing loss, no loose or painful teeth, + dentures  Hematologic:  no easy bruising, no abnormal bleeding, no clotting disorder, no frequent epistaxis  Endocrine:  +  diabetes, does check CBG's at home           Physical Exam:   BP 123/70    Pulse 71    Temp 97.8 F (36.6 C) (Skin)    Resp 20    Ht 5\' 11"  (1.803 m)    Wt 141 lb (64 kg)    SpO2 98% Comment: RA   BMI 19.67 kg/m   General:  Thin, elderly but well-appearing  HEENT:  Unremarkable   Neck:   no JVD, no bruits, no adenopathy   Chest:   clear to auscultation, symmetrical breath sounds, no wheezes, no rhonchi   CV:   RRR, grade III/VI crescendo/decrescendo murmur heard best at RSB,  no diastolic murmur  Abdomen:  soft, non-tender, no masses   Extremities:  warm, well-perfused, pulses palpable, no LE edema  Rectal/GU  Deferred  Neuro:   Grossly non-focal and symmetrical throughout  Skin:   Clean and dry, no rashes, no breakdown   Diagnostic Tests:  EKG: NSR w/out significant AV conduction delay    ECHOCARDIOGRAM REPORT    Patient Name:  Brian [name] Date of Exam: 09/29/2019  Medical Rec #: [id number]     Height:    68.0 in  Accession #:  [id number]    Weight:    167.0 lb  Date of Birth: 1934-05-22     BSA:     1.89 m  Patient Age:  87 years     BP:      136/80 mmHg  Patient Gender: M         HR:      47 bpm.  Exam Location: [address]   Procedure: 2D Echo, Cardiac Doppler and Color Doppler   Indications:  I35.0 Aortic Stenosis    History:    Patient has prior history of Echocardiogram examinations,  most         recent 12/14/2017. Risk Factors:Hypertension, Diabetes and         Dyslipidemia. Congestive heart failure. Chronic kidney  disease.    Sonographer:  Wilford Sports Rodgers-Jones RDCS  Referring Phys: [id number] TIFFANY [place]   IMPRESSIONS    1. Left ventricular ejection fraction, by visual estimation, is 65 to  70%. The left ventricle has hyperdynamic function. There is moderately  increased left ventricular hypertrophy.  2. Elevated left atrial pressure.  3. Left  ventricular diastolic parameters are consistent with Grade I  diastolic dysfunction (impaired relaxation).  4. The left ventricle has no regional wall motion abnormalities.  5. Global right ventricle has normal systolic function.The right  ventricular size is normal. No increase in right ventricular wall  thickness.  6. Left atrial size was normal.  7. Right atrial size was normal.  8. The mitral valve is normal in structure. Mild mitral valve  regurgitation. No evidence of mitral stenosis.  9. The tricuspid valve is normal in structure.  10. The tricuspid valve is normal in structure. Tricuspid valve  regurgitation is mild.  11. Aortic valve mean gradient measures 37.0 mmHg.  12. The aortic valve is normal in structure. Aortic valve regurgitation is  not visualized. Severe aortic valve stenosis.  13. There is severe calcifcation of the aortic valve.  14. There is severe thickening of the aortic valve.  15. The pulmonic valve was normal in structure. Pulmonic valve  regurgitation is not visualized.  16. Normal pulmonary artery systolic pressure.  17. The tricuspid regurgitant velocity is 2.33 m/s, and with an assumed  right atrial pressure of 3 mmHg, the estimated right ventricular systolic  pressure is normal at 24.7 mmHg.  18. The inferior vena cava is normal in size with greater than 50%  respiratory variability, suggesting right atrial pressure of 3 mmHg.  19. Severe aortic stenosis, peak/mean transaortic gradients 58/37 mmHg,  dimensionless index 0.26.   FINDINGS  Left Ventricle: Left ventricular ejection fraction, by visual estimation,  is 65 to 70%. The left ventricle has hyperdynamic function. The left  ventricle has  no regional wall motion abnormalities. There is moderately  increased left ventricular  hypertrophy. Concentric left ventricular hypertrophy. Left ventricular  diastolic parameters are consistent with Grade I diastolic dysfunction  (impaired  relaxation). Elevated left atrial pressure.   Right Ventricle: The right ventricular size is normal. No increase in  right ventricular wall thickness. Global RV systolic function is has  normal systolic function. The tricuspid regurgitant velocity is 2.33 m/s,  and with an assumed right atrial pressure  of 3 mmHg, the estimated right ventricular systolic pressure is normal at  24.7 mmHg.   Left Atrium: Left atrial size was normal in size.   Right Atrium: Right atrial size was normal in size   Pericardium: There is no evidence of pericardial effusion.   Mitral Valve: The mitral valve is normal in structure. Mild mitral valve  regurgitation. No evidence of mitral valve stenosis by observation.   Tricuspid Valve: The tricuspid valve is normal in structure. Tricuspid  valve regurgitation is mild.   Aortic Valve: The aortic valve is normal in structure.. There is severe  thickening and severe calcifcation of the aortic valve. Aortic valve  regurgitation is not visualized. Severe aortic stenosis is present. There  is severe thickening of the aortic  valve. There is severe calcifcation of the aortic valve. Aortic valve mean  gradient measures 37.0 mmHg. Aortic valve peak gradient measures 42.8  mmHg. Aortic valve area, by VTI measures 1.22 cm.   Pulmonic Valve: The pulmonic valve was normal in structure. Pulmonic valve  regurgitation is not visualized. Pulmonic regurgitation is not visualized.   Aorta: The aortic root, ascending aorta and aortic arch are all  structurally normal, with no evidence of dilitation or obstruction.   Venous: The inferior vena cava is normal in size with greater than 50%  respiratory variability, suggesting right atrial pressure of 3 mmHg.   IAS/Shunts: No atrial level shunt detected by color flow Doppler. There is  no evidence of a patent foramen ovale. No ventricular septal defect is  seen or detected. There is no evidence of an atrial septal defect.      LEFT VENTRICLE  PLAX 2D  LVIDd:     4.20 cm Diastology  LVIDs:     2.60 cm LV e' lateral:  7.21 cm/s  LV PW:     1.40 cm LV E/e' lateral: 11.8  LV IVS:    1.10 cm LV e' medial:  6.34 cm/s  LVOT diam:   2.20 cm LV E/e' medial: 13.4  LV SV:     54 ml  LV SV Index:  28.16  LVOT Area:   3.80 cm     RIGHT VENTRICLE  RV Basal diam: 3.90 cm  RV S prime:   24.40 cm/s  TAPSE (M-mode): 2.5 cm   LEFT ATRIUM      Index    RIGHT ATRIUM      Index  LA diam:   3.80 cm 2.01 cm/m RA Area:   11.60 cm  LA Vol (A2C): 35.2 ml 18.60 ml/m RA Volume:  27.50 ml 14.53 ml/m  LA Vol (A4C): 40.5 ml 21.39 ml/m  AORTIC VALVE  AV Area (Vmax):  1.23 cm  AV Area (Vmean):  1.27 cm  AV Area (VTI):   1.22 cm  AV Vmax:      327.20 cm/s  AV Vmean:     244.000 cm/s  AV VTI:      0.631 m  AV Peak Grad:   42.8 mmHg  AV Mean  Grad:   37.0 mmHg  LVOT Vmax:     105.45 cm/s  LVOT Vmean:    81.500 cm/s  LVOT VTI:     0.202 m  LVOT/AV VTI ratio: 0.32    AORTA  Ao Root diam: 4.00 cm  Ao Asc diam: 3.70 cm   MITRAL VALVE             TRICUSPID VALVE  MV Area (PHT): 2.42 cm       TR Peak grad:  21.7 mmHg  MV PHT:    90.77 msec      TR Vmax:    233.00 cm/s  MV Decel Time: 313 msec  MV E velocity: 84.80 cm/s 103 cm/s SHUNTS  MV A velocity: 110.00 cm/s 70.3 cm/s Systemic VTI: 0.20 m  MV E/A ratio: 0.77    1.5    Systemic Diam: 2.20 cm     Ena Dawley MD  Electronically signed by Ena Dawley MD  Signature Date/Time: 09/29/2019/8:33:13 PM      RIGHT/LEFT HEART CATH AND CORONARY ANGIOGRAPHY  Conclusion    Prox RCA lesion is 20% stenosed.  Mid RCA to Dist RCA lesion is 40% stenosed.  Mid LAD lesion is 50% stenosed.  2nd Diag lesion is 60% stenosed.  1st Diag lesion is 80% stenosed.  LV end diastolic pressure is normal.   `1. Mild to  moderate non-obstructive disease in the LAD. Moderate disease in the small caliber first and second diagonal branches 2. Mild non-obstructive disease in the RCA and Circumflex 3. Severe aortic stenosis by echo (cath with mean gradient 25 mmHg)  Recommendations: Will continue workup for TAVR    Recommendations  Antiplatelet/Anticoag Will continue workup for TAVR  Indications  Severe aortic stenosis [I35.0 (ICD-10-CM)]  Procedural Details  Technical Details Indication: Severe aortic stenosis. TAVR workup  Procedure: The risks, benefits, complications, treatment options, and expected outcomes were discussed with the patient. The patient and/or family concurred with the proposed plan, giving informed consent. The patient was brought to the cath lab after IV hydration was given. The patient was sedated with Versed and Fentanyl. The IV catheter in the right antecubital vein was changed for a 5 [place] sheath. Right heart catheterization performed with a balloon tipped catheter. The right wrist was prepped and draped in a sterile fashion. 1% lidocaine was used for local anesthesia. Using the modified Seldinger access technique, a 5 French sheath was placed in the right radial artery. 3 mg Verapamil was given through the sheath. 3500 units IV heparin was given. Standard diagnostic catheters were used to perform selective coronary angiography. I crossed the aortic valve with a JR4 catheter and a J wire. The sheath was removed from the right radial artery and a Terumo hemostasis band was applied at the arteriotomy site on the right wrist.    Estimated blood loss <50 mL.   During this procedure medications were administered to achieve and maintain moderate conscious sedation while the patient's heart rate, blood pressure, and oxygen saturation were continuously monitored and I was present face-to-face 100% of this time.  Medications (Filter: Administrations occurring from 01/12/20 1325 to 01/12/20  1417) (important)  Continuous medications are totaled by the amount administered until 01/12/20 1417.  fentaNYL (SUBLIMAZE) injection (mcg) Total dose:  12.5 mcg Date/Time  Rate/Dose/Volume Action  01/12/20 1351  12.5 mcg Given    midazolam (VERSED) injection (mg) Total dose:  0.5 mg Date/Time  Rate/Dose/Volume Action  01/12/20 1351  0.5 mg Given    Heparin (Porcine)  in NaCl 1000-0.9 UT/500ML-% SOLN (mL) Total volume:  1,000 mL Date/Time  Rate/Dose/Volume Action  01/12/20 1351  500 mL Given  1351  500 mL Given    lidocaine (PF) (XYLOCAINE) 1 % injection (mL) Total volume:  5 mL Date/Time  Rate/Dose/Volume Action  01/12/20 1352  5 mL Given    Radial Cocktail/Verapamil only (mL) Total volume:  10 mL Date/Time  Rate/Dose/Volume Action  01/12/20 1352  10 mL Given    heparin sodium (porcine) injection (Units) Total dose:  3,500 Units Date/Time  Rate/Dose/Volume Action  01/12/20 1357  3,500 Units Given    iohexol (OMNIPAQUE) 350 MG/ML injection (mL) Total volume:  85 mL Date/Time  Rate/Dose/Volume Action  01/12/20 1411  85 mL Given    Sedation Time  Sedation Time Physician-1: 18 minutes 16 seconds  Contrast  Medication Name Total Dose  iohexol (OMNIPAQUE) 350 MG/ML injection 85 mL    Radiation/Fluoro  Fluoro time: 4.4 (min) DAP: 8052 (mGycm2) Cumulative Air Kerma: Q000111Q (mGy)  Complications  Complications documented before study signed (01/12/2020 2:27 PM)   RIGHT/LEFT HEART CATH AND CORONARY ANGIOGRAPHY  None Documented by Burnell Blanks, MD 01/12/2020 2:26 PM  Date Found: 01/12/2020  Time Range: Intraprocedure      Coronary Findings  Diagnostic Dominance: Right Left Anterior Descending  Vessel is large.  Mid LAD lesion 50% stenosed  Mid LAD lesion is 50% stenosed. The lesion is calcified.  First Diagonal Branch  Vessel is small in size.  1st Diag lesion 80% stenosed  1st Diag lesion is 80% stenosed.  Second Diagonal Branch  2nd Diag lesion  60% stenosed  2nd Diag lesion is 60% stenosed.  Left Circumflex  Vessel is large.  Right Coronary Artery  Vessel is large.  Prox RCA lesion 20% stenosed  Prox RCA lesion is 20% stenosed.  Mid RCA to Dist RCA lesion 40% stenosed  Mid RCA to Dist RCA lesion is 40% stenosed.  Intervention  No interventions have been documented. Right Heart  Right Heart Pressures LV EDP is normal.  Coronary Diagrams  Diagnostic Dominance: Right  Intervention  Implants   No implant documentation for this case.  Syngo Images  Show images for CARDIAC CATHETERIZATION  Images on Long Term Storage  Show images for Lajarvis, [place] to Procedure Log  Procedure Log    Hemo Data   Most Recent Value  Fick Cardiac Output 6.52 L/min  Fick Cardiac Output Index 3.59 (L/min)/BSA  Aortic Mean Gradient 25.1 mmHg  Aortic Peak Gradient 22 mmHg  Aortic Valve Area 1.33  Aortic Value Area Index 0.73 cm2/BSA  RA A Wave 2 mmHg  RA V Wave 3 mmHg  RA Mean 2 mmHg  RV Systolic Pressure 25 mmHg  RV Diastolic Pressure 0 mmHg  RV EDP 3 mmHg  PA Systolic Pressure 21 mmHg  PA Diastolic Pressure 8 mmHg  PA Mean 13 mmHg  PW A Wave 9 mmHg  PW V Wave 8 mmHg  PW Mean 7 mmHg  AO Systolic Pressure 123456 mmHg  AO Diastolic Pressure 67 mmHg  AO Mean 99 mmHg  LV Systolic Pressure [id number] mmHg  LV Diastolic Pressure 0 mmHg  LV EDP 6 mmHg  AOp Systolic Pressure 123456 mmHg  AOp Diastolic Pressure 68 mmHg  AOp Mean Pressure [id number] mmHg  LVp Systolic Pressure [id number] mmHg  LVp Diastolic Pressure 0 mmHg  LVp EDP Pressure 6 mmHg  QP/QS 1  TPVR Index 3.63 HRUI  TSVR Index 27.61 HRUI  PVR  SVR Ratio 0.06  TPVR/TSVR Ratio 0.13     CT ANGIOGRAPHY CHEST, ABDOMEN AND PELVIS  TECHNIQUE: Non-contrast CT of the chest was initially obtained.  Multidetector CT imaging through the chest, abdomen and pelvis was performed using the standard protocol during bolus administration of intravenous contrast. Multiplanar  reconstructed images and MIPs were obtained and reviewed to evaluate the vascular anatomy.  CONTRAST:  117mL OMNIPAQUE IOHEXOL 350 MG/ML SOLN  COMPARISON:  CT the abdomen and pelvis 11/28/2013.  FINDINGS: CTA CHEST FINDINGS  Cardiovascular: Heart size is normal. There is no significant pericardial fluid, thickening or pericardial calcification. There is aortic atherosclerosis, as well as atherosclerosis of the great vessels of the mediastinum and the coronary arteries, including calcified atherosclerotic plaque in the left main, left anterior descending, left circumflex and right coronary arteries. Severe thickening calcification of the aortic valve.  Mediastinum/Lymph Nodes: No pathologically enlarged mediastinal or hilar lymph nodes. Esophagus is unremarkable in appearance. No axillary lymphadenopathy.  Lungs/Pleura: No suspicious appearing pulmonary nodules or masses are noted. No acute consolidative airspace disease. No pleural effusions.  Musculoskeletal/Soft Tissues: There are no aggressive appearing lytic or blastic lesions noted in the visualized portions of the skeleton.  CTA ABDOMEN AND PELVIS FINDINGS  Hepatobiliary: No suspicious cystic or solid hepatic lesions. No intra or extrahepatic biliary ductal dilatation. Gallbladder is normal in appearance.  Pancreas: No pancreatic mass. No pancreatic ductal dilatation. No pancreatic or peripancreatic fluid collections or inflammatory changes.  Spleen: Unremarkable.  Adrenals/Urinary Tract: Multiple low-attenuation lesions in the kidneys bilaterally, compatible with simple cysts, measuring up to 2.2 cm in the upper pole the left kidney. Mild thickening of the left adrenal gland suggestive of hyperplasia. Right adrenal gland is normal in appearance. No hydroureteronephrosis. Urinary bladder is normal in appearance.  Stomach/Bowel: Normal appearance of the stomach. No pathologic dilatation of small  bowel or colon. Numerous colonic diverticulae are noted, particularly in the descending colon and sigmoid colon, without surrounding inflammatory changes to suggest an acute diverticulitis at this time. The appendix is not confidently identified and may be surgically absent. Regardless, there are no inflammatory changes noted adjacent to the cecum to suggest the presence of an acute appendicitis at this time.  Vascular/Lymphatic: Aortic atherosclerosis, without evidence of aneurysm or dissection in the abdominal or pelvic vasculature. Vascular findings and measurements pertinent to potential TAVR procedure, as detailed below. No lymphadenopathy noted in the abdomen or pelvis.  Reproductive: Prostate gland and seminal vesicles are unremarkable in appearance.  Other: No significant volume of ascites.  No pneumoperitoneum.  Musculoskeletal: There are no aggressive appearing lytic or blastic lesions noted in the visualized portions of the skeleton.  VASCULAR MEASUREMENTS PERTINENT TO TAVR:  AORTA:  Minimal Aortic Diameter-11 x 13 mm  Severity of Aortic Calcification-severe  RIGHT PELVIS:  Right Common Iliac Artery -  Minimal Diameter-9.6 x 8.9 mm  Tortuosity-mild  Calcification-moderate  Right External Iliac Artery -  Minimal Diameter-8.4 x 8.4 mm  Tortuosity-mild  Calcification-mild  Right Common Femoral Artery -  Minimal Diameter-8.3 x 7.6 mm  Tortuosity-mild  Calcification-mild  LEFT PELVIS:  Left Common Iliac Artery -  Minimal Diameter-8.7 x 8.3 mm  Tortuosity-mild  Calcification-moderate  Left External Iliac Artery -  Minimal Diameter-8.0 x 6.8 mm  Tortuosity-mild  Calcification-mild  Left Common Femoral Artery -  Minimal Diameter-8.8 x 6.0 mm  Tortuosity-mild  Calcification-mild  Review of the MIP images confirms the above findings.  IMPRESSION: 1. Vascular findings and measurements pertinent to  potential TAVR procedure, as detailed  above. 2. Severe thickening calcification of the aortic valve, compatible with reported clinical history of severe aortic stenosis. 3. Aortic atherosclerosis, in addition to left main and 3 vessel coronary artery disease. 4. Severe colonic diverticulosis without evidence of acute diverticulitis at this time. 5. Additional incidental findings, as above.   Electronically Signed   By: Vinnie Langton M.D.   On: 01/18/2020 11:49    Impression:  Patient has at least stage B and probably early stage D severe symptomatic aortic stenosis.  He tends to minimize symptoms but has reportedly had at least some intermittent symptoms of exertional shortness of breath as well as progressively decreased exercise tolerance with worsening fatigue consistent with chronic diastolic congestive heart failure, [place] Heart Association functional class I-II.  He has also lost a fair amount of weight over the past year despite no reported loss of appetite.  I personally reviewed the patient's recent transthoracic echocardiogram, diagnostic cardiac catheterization, CT angiograms, and EKG.  The patient's aortic valve is trileaflet.  There is moderate thickening, calcification, and restricted leaflet mobility involving all 3 leaflets of the aortic valve.  Peak velocity across aortic valve has measured above 3.3 m/s corresponding to mean transvalvular gradient estimated close to 40 mmHg, all consistent with moderately severe and probably early severe aortic stenosis.  Diagnostic cardiac catheterization is notable for the presence of moderate nonobstructive coronary artery disease.  Mean transvalvular gradient across the aortic valve was measured 25 mmHg at catheterization.  Options include continued close observation on medical therapy versus elective aortic valve replacement.  I agree the patient might benefit from aortic valve replacement in the near future.  Risks associated  with conventional surgery would be somewhat elevated because of the patient's advanced age.  I am particularly concerned about the possibility that the patient may be developing significant cognitive dysfunction with some degree of worsening short-term memory loss reported by his wife.  Cardiac-gated CTA of the heart reveals anatomical characteristics consistent with aortic stenosis suitable for treatment by transcatheter aortic valve replacement without any significant complicating features and CTA of the aorta and iliac vessels demonstrate what appears to be adequate pelvic vascular access to facilitate a transfemoral approach.  EKG reveals sinus rhythm without significant AV conduction delay.  Under the circumstances I would favor transcatheter aortic valve replacement to conventional surgery.   Plan:  The patient and his wife were counseled at length regarding treatment alternatives for management of severe symptomatic aortic stenosis. Alternative approaches such as conventional aortic valve replacement, transcatheter aortic valve replacement, and continued medical therapy without intervention were compared and contrasted at length.  The risks associated with conventional surgical aortic valve replacement were discussed in detail, as were expectations for post-operative convalescence, and why I would be reluctant to consider this patient a candidate for conventional surgery.  Issues specific to transcatheter aortic valve replacement were discussed including questions about long term valve durability, the potential for paravalvular leak, possible increased risk of need for permanent pacemaker placement, and other technical complications related to the procedure itself.  Long-term prognosis with medical therapy was discussed. This discussion was placed in the context of the patient's own specific clinical presentation and past medical history.  All of their questions have been addressed.  The patient desires  to proceed with transcatheter aortic valve replacement in the near future.  We tentatively plan for surgery on January 31, 2020.  Following the decision to proceed with transcatheter aortic valve replacement, a discussion has been held regarding what types  of management strategies would be attempted intraoperatively in the event of life-threatening complications, including whether or not the patient would be considered a candidate for the use of cardiopulmonary bypass and/or conversion to open sternotomy for attempted surgical intervention.  The patient specifically requests that should a potentially life-threatening complication develop we would only attempt emergency median sternotomy and/or other aggressive surgical procedures if the patient had complications that were felt to be relatively straightforward to reverse.  The patient has been advised of a variety of complications that might develop including but not limited to risks of death, stroke, paravalvular leak, aortic dissection or other major vascular complications, aortic annulus rupture, device embolization, cardiac rupture or perforation, mitral regurgitation, acute myocardial infarction, arrhythmia, heart block or bradycardia requiring permanent pacemaker placement, congestive heart failure, respiratory failure, renal failure, pneumonia, infection, other late complications related to structural valve deterioration or migration, or other complications that might ultimately cause a temporary or permanent loss of functional independence or other long term morbidity.  The patient provides full informed consent for the procedure as described and all questions were answered.      I spent in excess of 90 minutes during the conduct of this office consultation and >50% of this time involved direct face-to-face encounter with the patient for counseling and/or coordination of their care.    Valentina Gu. Roxy Manns, MD 01/18/2020 3:01 PM

## 2020-01-18 NOTE — Progress Notes (Signed)
HEART AND [place] SURGERY CONSULTATION REPORT  Referring Provider is Skeet Latch, MD PCP is Delsa Grana, PA-C  Chief Complaint  Patient presents with  . Aortic Stenosis    Surgical eval for TAVR, review all studies     HPI:  Patient is an 86 year old African-American male with history of aortic stenosis, hypertension, chronic diastolic congestive heart failure, hyperlipidemia, stage III chronic kidney disease, and insulin-dependent type 2 diabetes mellitus who has been referred for surgical consultation to discuss treatment options for management of severe symptomatic aortic stenosis.  Patient has been followed for several years by Dr. Oval Linsey with known history of aortic stenosis and hypertension.  Previous echocardiograms have documented the presence of normal left ventricular systolic function with aortic stenosis that has slowly progressed in severity.  Over the past year the patient has developed increased tendency to get fatigued with decreased exercise tolerance and weight loss.  Recent follow-up echocardiogram revealed significant progression of aortic stenosis with peak velocity across aortic valve measured greater than 3.3 m/s corresponding to mean transvalvular gradient estimated 37 mmHg.  DVI was reported 0.32.  Left ventricular systolic function remain normal.  The patient was referred to the multidisciplinary heart valve clinic and has been evaluated previously by Dr. Angelena Form.  Catheterization revealed mild to moderate nonobstructive coronary artery disease.  Mean transvalvular gradient across aortic valve was reported 25 mmHg.  Right heart pressures were normal.  CT angiography was performed and the patient was referred for surgical consultation.  Patient is married and lives locally in [place] with his wife who is a former Psychologist, sport and exercise.  Patient has remained physically active and  functionally independent all of his adult life.  He enjoys gardening and doing other chores around the house.  He has been noted to have decreased exercise tolerance with increased fatigue.  He has had some intermittent shortness of breath with exertion although he denies any exertional shortness of breath presently.  He has never had any chest pain or chest tightness.  He denies resting shortness of breath, PND, orthopnea, or lower extremity edema.  He has not had palpitations, dizzy spells, or syncope.  He reports good appetite but he has lost approximately 30 pounds in weight.  He is reported to have developed some problems with short-term memory loss.  Past Medical History:  Diagnosis Date  . Anemia of chronic renal failure 05/08/2015  . Anemia of chronic renal failure 05/08/2015  . Aortic stenosis, mild 09/21/2015  . Aortic stenosis, severe 09/21/2015  . Arthritis   . Bladder stone   . Chronic kidney disease (CKD), stage III (moderate) 05/08/2015   Followed by Dr. Rolly Salter   . Congestive heart failure ([place]) 03/14/2019  . Diastolic dysfunction without heart failure 06/08/2017  . Essential hypertension 05/08/2015  . History of bladder stone   . Hyperlipidemia   . Hypertension   . Nocturia   . Renal disorder   . Type 2 diabetes mellitus ([place])     Past Surgical History:  Procedure Laterality Date  . CYSTOSCOPY/RETROGRADE/URETEROSCOPY/STONE EXTRACTION WITH BASKET N/A 03/22/2013   Procedure: CYSTOSCOPY BLADDER STONE STONE EXTRACTION;  Surgeon: [name], MD;  Location: [facility];  Service: Urology;  Laterality: N/A;  CYSTOSCOPY    . RIGHT/LEFT HEART CATH AND CORONARY ANGIOGRAPHY N/A 01/12/2020   Procedure: RIGHT/LEFT HEART CATH AND CORONARY ANGIOGRAPHY;  Surgeon: Burnell Blanks, MD;  Location: [facility];  Service: Cardiovascular;  Laterality:  N/A;  . TONSILLECTOMY      Family History  Problem Relation Age of Onset  . Hyperlipidemia Father   .  Congestive Heart Failure Other   . Heart attack Other   . Heart disease Other   . Diabetes Brother     Social History   Socioeconomic History  . Marital status: Married    Spouse name: [name]  . Number of children: 4  . Years of education: some college  . Highest education level: 12th grade  Occupational History  . Occupation: Engineer, manufacturing systems  Tobacco Use  . Smoking status: Former Smoker    Packs/day: 0.75    Years: 39.00    Pack years: 29.25    Types: Cigarettes    Quit date: 03/21/1993    Years since quitting: 26.8  . Smokeless tobacco: Never Used  . Tobacco comment: smoking cessation materials not required  Substance and Sexual Activity  . Alcohol use: No    Alcohol/week: 0.0 standard drinks  . Drug use: No  . Sexual activity: Yes  Other Topics Concern  . Not on file  Social History Narrative   Lives with wife in [place], [place].   Social Determinants of Health   Financial Resource Strain: Low Risk   . Difficulty of Paying Living Expenses: Not very hard  Food Insecurity: No Food Insecurity  . Worried About Charity fundraiser in the Last Year: Never true  . Ran Out of Food in the Last Year: Never true  Transportation Needs: No Transportation Needs  . Lack of Transportation (Medical): No  . Lack of Transportation (Non-Medical): No  Physical Activity: Inactive  . Days of Exercise per Week: 0 days  . Minutes of Exercise per Session: 0 min  Stress: No Stress Concern Present  . Feeling of Stress : Not at all  Social Connections: Slightly Isolated  . Frequency of Communication with Friends and Family: More than three times a week  . Frequency of Social Gatherings with Friends and Family: More than three times a week  . Attends Religious Services: More than 4 times per year  . Active Member of Clubs or Organizations: No  . Attends Archivist Meetings: Never  . Marital Status: Married  Human resources officer Violence: Not At Risk  . Fear of Current or  Ex-Partner: No  . Emotionally Abused: No  . Physically Abused: No  . Sexually Abused: No    Current Outpatient Medications  Medication Sig Dispense Refill  . ACCU-CHEK GUIDE test strip USE UP TO 4 TIMES A DAY AS DIRECTED 100 strip 0  . Accu-Chek Softclix Lancets lancets     . aspirin EC 81 MG tablet Take 81 mg by mouth daily. Reported on 09/17/2015    . COMBIGAN 0.2-0.5 % ophthalmic solution Place 1 drop into both eyes 2 (two) times daily.     Marland Kitchen docusate sodium (DOK) 100 MG capsule Take 1 capsule (100 mg total) by mouth 2 (two) times daily as needed. 100 capsule 5  . finasteride (PROSCAR) 5 MG tablet Take 1 tablet (5 mg total) by mouth daily. 90 tablet 3  . Insulin Detemir (LEVEMIR FLEXTOUCH) 100 UNIT/ML Pen Inject 10-20 Units into the skin daily. (Patient taking differently: Inject 12 Units into the skin 2 (two) times daily. ) 15 mL 1  . Insulin Pen Needle (B-D UF III MINI PEN NEEDLES) 31G X 5 MM MISC USE AS DIRECTED WITH LEVEMIR INSULIN PENS AT BEDTIME, once a day 100 each 3  .  Omega-3 Fatty Acids (FISH OIL) 1000 MG CAPS Take 1 capsule (1,000 mg total) by mouth daily. 90 capsule 3  . quinapril (ACCUPRIL) 20 MG tablet TAKE 1 TABLET BY MOUTH TWICE A DAY (Patient taking differently: 20 mg daily. ) 180 tablet 2  . rosuvastatin (CRESTOR) 5 MG tablet TAKE ONE TABLET BY MOUTH FOUR NIGHTS A WEEK (Patient taking differently: Take 5 mg by mouth See admin instructions. TAKE ONE TABLET BY MOUTH FOUR NIGHTS A WEEK) 50 tablet 0  . tamsulosin (FLOMAX) 0.4 MG CAPS capsule TAKE 1 CAPSULE BY MOUTH EVERY DAY (Patient taking differently: Take 0.4 mg by mouth daily. ) 30 capsule 5  . amLODipine (NORVASC) 10 MG tablet TAKE 1 TABLET (10 MG TOTAL) BY MOUTH DAILY. (FOR HIGH BLOOD PRESSURE) (Patient not taking: Reported on 01/18/2020) 90 tablet 2   No current facility-administered medications for this visit.    Allergies  Allergen Reactions  . Carvedilol Other (See Comments)    bradycardia  . Niaspan [Niacin Er]    . Shellfish Allergy Other (See Comments)    Pt vomits  . Statins Other (See Comments)    Myalgia  . Sulfa Antibiotics   . Zetia [Ezetimibe]   . Januvia [Sitagliptin] Rash      Review of Systems:   General:  normal appetite, decreased energy, no weight gain, + weight loss, no fever  Cardiac:  no chest pain with exertion, no chest pain at rest, + intermittent SOB with exertion, no resting SOB, no PND, no orthopnea, no palpitations, no arrhythmia, no atrial fibrillation, no LE edema, no dizzy spells, no syncope  Respiratory:  no shortness of breath, no home oxygen, no productive cough, no dry cough, no bronchitis, no wheezing, no hemoptysis, no asthma, no pain with inspiration or cough, no sleep apnea, no CPAP at night  GI:   no difficulty swallowing, no reflux, no frequent heartburn, no hiatal hernia, no abdominal pain, no constipation, no diarrhea, no hematochezia, no hematemesis, no melena  GU:   no dysuria,  no frequency, no urinary tract infection, no hematuria, no enlarged prostate, + kidney stones, + kidney disease  Vascular:  no pain suggestive of claudication, no pain in feet, no leg cramps, no varicose veins, no DVT, no non-healing foot ulcer  Neuro:   no stroke, no TIA's, no seizures, no headaches, no temporary blindness one eye,  no slurred speech, no peripheral neuropathy, no chronic pain, no instability of gait, + memory/cognitive dysfunction  Musculoskeletal: + arthritis, no joint swelling, no myalgias, no difficulty walking, normal mobility   Skin:   no rash, no itching, no skin infections, no pressure sores or ulcerations  Psych:   no anxiety, no depression, no nervousness, no unusual recent stress  Eyes:   no blurry vision, no floaters, no recent vision changes, does not wear glasses or contacts  ENT:   no hearing loss, no loose or painful teeth, + dentures  Hematologic:  no easy bruising, no abnormal bleeding, no clotting disorder, no frequent epistaxis  Endocrine:  +  diabetes, does check CBG's at home           Physical Exam:   BP 123/70   Pulse 71   Temp 97.8 F (36.6 C) (Skin)   Resp 20   Ht 5\' 11"  (1.803 m)   Wt 141 lb (64 kg)   SpO2 98% Comment: RA  BMI 19.67 kg/m   General:  Thin, elderly but well-appearing  HEENT:  Unremarkable   Neck:   no  JVD, no bruits, no adenopathy   Chest:   clear to auscultation, symmetrical breath sounds, no wheezes, no rhonchi   CV:   RRR, grade III/VI crescendo/decrescendo murmur heard best at RSB,  no diastolic murmur  Abdomen:  soft, non-tender, no masses   Extremities:  warm, well-perfused, pulses palpable, no LE edema  Rectal/GU  Deferred  Neuro:   Grossly non-focal and symmetrical throughout  Skin:   Clean and dry, no rashes, no breakdown   Diagnostic Tests:  EKG: NSR w/out significant AV conduction delay    ECHOCARDIOGRAM REPORT    Patient Name:  MATTIS PERL Date of Exam: 09/29/2019  Medical Rec #: [id number]     Height:    68.0 in  Accession #:  [id number]    Weight:    167.0 lb  Date of Birth: May 23, 1934     BSA:     1.89 m  Patient Age:  70 years     BP:      136/80 mmHg  Patient Gender: M         HR:      47 bpm.  Exam Location: [address]   Procedure: 2D Echo, Cardiac Doppler and Color Doppler   Indications:  I35.0 Aortic Stenosis    History:    Patient has prior history of Echocardiogram examinations,  most         recent 12/14/2017. Risk Factors:Hypertension, Diabetes and         Dyslipidemia. Congestive heart failure. Chronic kidney  disease.    Sonographer:  Wilford Sports Rodgers-Jones RDCS  Referring Phys: [id number] TIFFANY Ralston   IMPRESSIONS    1. Left ventricular ejection fraction, by visual estimation, is 65 to  70%. The left ventricle has hyperdynamic function. There is moderately  increased left ventricular hypertrophy.  2. Elevated left atrial pressure.  3. Left  ventricular diastolic parameters are consistent with Grade I  diastolic dysfunction (impaired relaxation).  4. The left ventricle has no regional wall motion abnormalities.  5. Global right ventricle has normal systolic function.The right  ventricular size is normal. No increase in right ventricular wall  thickness.  6. Left atrial size was normal.  7. Right atrial size was normal.  8. The mitral valve is normal in structure. Mild mitral valve  regurgitation. No evidence of mitral stenosis.  9. The tricuspid valve is normal in structure.  10. The tricuspid valve is normal in structure. Tricuspid valve  regurgitation is mild.  11. Aortic valve mean gradient measures 37.0 mmHg.  12. The aortic valve is normal in structure. Aortic valve regurgitation is  not visualized. Severe aortic valve stenosis.  13. There is severe calcifcation of the aortic valve.  14. There is severe thickening of the aortic valve.  15. The pulmonic valve was normal in structure. Pulmonic valve  regurgitation is not visualized.  16. Normal pulmonary artery systolic pressure.  17. The tricuspid regurgitant velocity is 2.33 m/s, and with an assumed  right atrial pressure of 3 mmHg, the estimated right ventricular systolic  pressure is normal at 24.7 mmHg.  18. The inferior vena cava is normal in size with greater than 50%  respiratory variability, suggesting right atrial pressure of 3 mmHg.  19. Severe aortic stenosis, peak/mean transaortic gradients 58/37 mmHg,  dimensionless index 0.26.   FINDINGS  Left Ventricle: Left ventricular ejection fraction, by visual estimation,  is 65 to 70%. The left ventricle has hyperdynamic function. The left  ventricle has no regional wall motion abnormalities. There is  moderately  increased left ventricular  hypertrophy. Concentric left ventricular hypertrophy. Left ventricular  diastolic parameters are consistent with Grade I diastolic dysfunction  (impaired  relaxation). Elevated left atrial pressure.   Right Ventricle: The right ventricular size is normal. No increase in  right ventricular wall thickness. Global RV systolic function is has  normal systolic function. The tricuspid regurgitant velocity is 2.33 m/s,  and with an assumed right atrial pressure  of 3 mmHg, the estimated right ventricular systolic pressure is normal at  24.7 mmHg.   Left Atrium: Left atrial size was normal in size.   Right Atrium: Right atrial size was normal in size   Pericardium: There is no evidence of pericardial effusion.   Mitral Valve: The mitral valve is normal in structure. Mild mitral valve  regurgitation. No evidence of mitral valve stenosis by observation.   Tricuspid Valve: The tricuspid valve is normal in structure. Tricuspid  valve regurgitation is mild.   Aortic Valve: The aortic valve is normal in structure.. There is severe  thickening and severe calcifcation of the aortic valve. Aortic valve  regurgitation is not visualized. Severe aortic stenosis is present. There  is severe thickening of the aortic  valve. There is severe calcifcation of the aortic valve. Aortic valve mean  gradient measures 37.0 mmHg. Aortic valve peak gradient measures 42.8  mmHg. Aortic valve area, by VTI measures 1.22 cm.   Pulmonic Valve: The pulmonic valve was normal in structure. Pulmonic valve  regurgitation is not visualized. Pulmonic regurgitation is not visualized.   Aorta: The aortic root, ascending aorta and aortic arch are all  structurally normal, with no evidence of dilitation or obstruction.   Venous: The inferior vena cava is normal in size with greater than 50%  respiratory variability, suggesting right atrial pressure of 3 mmHg.   IAS/Shunts: No atrial level shunt detected by color flow Doppler. There is  no evidence of a patent foramen ovale. No ventricular septal defect is  seen or detected. There is no evidence of an atrial septal defect.      LEFT VENTRICLE  PLAX 2D  LVIDd:     4.20 cm Diastology  LVIDs:     2.60 cm LV e' lateral:  7.21 cm/s  LV PW:     1.40 cm LV E/e' lateral: 11.8  LV IVS:    1.10 cm LV e' medial:  6.34 cm/s  LVOT diam:   2.20 cm LV E/e' medial: 13.4  LV SV:     54 ml  LV SV Index:  28.16  LVOT Area:   3.80 cm     RIGHT VENTRICLE  RV Basal diam: 3.90 cm  RV S prime:   24.40 cm/s  TAPSE (M-mode): 2.5 cm   LEFT ATRIUM      Index    RIGHT ATRIUM      Index  LA diam:   3.80 cm 2.01 cm/m RA Area:   11.60 cm  LA Vol (A2C): 35.2 ml 18.60 ml/m RA Volume:  27.50 ml 14.53 ml/m  LA Vol (A4C): 40.5 ml 21.39 ml/m  AORTIC VALVE  AV Area (Vmax):  1.23 cm  AV Area (Vmean):  1.27 cm  AV Area (VTI):   1.22 cm  AV Vmax:      327.20 cm/s  AV Vmean:     244.000 cm/s  AV VTI:      0.631 m  AV Peak Grad:   42.8 mmHg  AV Mean Grad:   37.0 mmHg  LVOT  Vmax:     105.45 cm/s  LVOT Vmean:    81.500 cm/s  LVOT VTI:     0.202 m  LVOT/AV VTI ratio: 0.32    AORTA  Ao Root diam: 4.00 cm  Ao Asc diam: 3.70 cm   MITRAL VALVE             TRICUSPID VALVE  MV Area (PHT): 2.42 cm       TR Peak grad:  21.7 mmHg  MV PHT:    90.77 msec      TR Vmax:    233.00 cm/s  MV Decel Time: 313 msec  MV E velocity: 84.80 cm/s 103 cm/s SHUNTS  MV A velocity: 110.00 cm/s 70.3 cm/s Systemic VTI: 0.20 m  MV E/A ratio: 0.77    1.5    Systemic Diam: 2.20 cm     Ena Dawley MD  Electronically signed by Ena Dawley MD  Signature Date/Time: 09/29/2019/8:33:13 PM      RIGHT/LEFT HEART CATH AND CORONARY ANGIOGRAPHY  Conclusion    Prox RCA lesion is 20% stenosed.  Mid RCA to Dist RCA lesion is 40% stenosed.  Mid LAD lesion is 50% stenosed.  2nd Diag lesion is 60% stenosed.  1st Diag lesion is 80% stenosed.  LV end diastolic pressure is normal.   `1. Mild to  moderate non-obstructive disease in the LAD. Moderate disease in the small caliber first and second diagonal branches 2. Mild non-obstructive disease in the RCA and Circumflex 3. Severe aortic stenosis by echo (cath with mean gradient 25 mmHg)  Recommendations: Will continue workup for TAVR    Recommendations  Antiplatelet/Anticoag Will continue workup for TAVR  Indications  Severe aortic stenosis [I35.0 (ICD-10-CM)]  Procedural Details  Technical Details Indication: Severe aortic stenosis. TAVR workup  Procedure: The risks, benefits, complications, treatment options, and expected outcomes were discussed with the patient. The patient and/or family concurred with the proposed plan, giving informed consent. The patient was brought to the cath lab after IV hydration was given. The patient was sedated with Versed and Fentanyl. The IV catheter in the right antecubital vein was changed for a 5 [place] sheath. Right heart catheterization performed with a balloon tipped catheter. The right wrist was prepped and draped in a sterile fashion. 1% lidocaine was used for local anesthesia. Using the modified Seldinger access technique, a 5 French sheath was placed in the right radial artery. 3 mg Verapamil was given through the sheath. 3500 units IV heparin was given. Standard diagnostic catheters were used to perform selective coronary angiography. I crossed the aortic valve with a JR4 catheter and a J wire. The sheath was removed from the right radial artery and a Terumo hemostasis band was applied at the arteriotomy site on the right wrist.    Estimated blood loss <50 mL.   During this procedure medications were administered to achieve and maintain moderate conscious sedation while the patient's heart rate, blood pressure, and oxygen saturation were continuously monitored and I was present face-to-face 100% of this time.  Medications (Filter: Administrations occurring from 01/12/20 1325 to 01/12/20  1417) (important)  Continuous medications are totaled by the amount administered until 01/12/20 1417.  fentaNYL (SUBLIMAZE) injection (mcg) Total dose:  12.5 mcg Date/Time  Rate/Dose/Volume Action  01/12/20 1351  12.5 mcg Given    midazolam (VERSED) injection (mg) Total dose:  0.5 mg Date/Time  Rate/Dose/Volume Action  01/12/20 1351  0.5 mg Given    Heparin (Porcine) in NaCl 1000-0.9 UT/500ML-% SOLN (mL) Total  volume:  1,000 mL Date/Time  Rate/Dose/Volume Action  01/12/20 1351  500 mL Given  1351  500 mL Given    lidocaine (PF) (XYLOCAINE) 1 % injection (mL) Total volume:  5 mL Date/Time  Rate/Dose/Volume Action  01/12/20 1352  5 mL Given    Radial Cocktail/Verapamil only (mL) Total volume:  10 mL Date/Time  Rate/Dose/Volume Action  01/12/20 1352  10 mL Given    heparin sodium (porcine) injection (Units) Total dose:  3,500 Units Date/Time  Rate/Dose/Volume Action  01/12/20 1357  3,500 Units Given    iohexol (OMNIPAQUE) 350 MG/ML injection (mL) Total volume:  85 mL Date/Time  Rate/Dose/Volume Action  01/12/20 1411  85 mL Given    Sedation Time  Sedation Time Physician-1: 18 minutes 16 seconds  Contrast  Medication Name Total Dose  iohexol (OMNIPAQUE) 350 MG/ML injection 85 mL    Radiation/Fluoro  Fluoro time: 4.4 (min) DAP: 8052 (mGycm2) Cumulative Air Kerma: Q000111Q (mGy)  Complications  Complications documented before study signed (01/12/2020 2:27 PM)   RIGHT/LEFT HEART CATH AND CORONARY ANGIOGRAPHY  None Documented by Burnell Blanks, MD 01/12/2020 2:26 PM  Date Found: 01/12/2020  Time Range: Intraprocedure      Coronary Findings  Diagnostic Dominance: Right Left Anterior Descending  Vessel is large.  Mid LAD lesion 50% stenosed  Mid LAD lesion is 50% stenosed. The lesion is calcified.  First Diagonal Branch  Vessel is small in size.  1st Diag lesion 80% stenosed  1st Diag lesion is 80% stenosed.  Second Diagonal Branch  2nd Diag lesion  60% stenosed  2nd Diag lesion is 60% stenosed.  Left Circumflex  Vessel is large.  Right Coronary Artery  Vessel is large.  Prox RCA lesion 20% stenosed  Prox RCA lesion is 20% stenosed.  Mid RCA to Dist RCA lesion 40% stenosed  Mid RCA to Dist RCA lesion is 40% stenosed.  Intervention  No interventions have been documented. Right Heart  Right Heart Pressures LV EDP is normal.  Coronary Diagrams  Diagnostic Dominance: Right  Intervention  Implants   No implant documentation for this case.  Syngo Images  Show images for CARDIAC CATHETERIZATION  Images on Long Term Storage  Show images for [place], Hoschar to Procedure Log  Procedure Log    Hemo Data   Most Recent Value  Fick Cardiac Output 6.52 L/min  Fick Cardiac Output Index 3.59 (L/min)/BSA  Aortic Mean Gradient 25.1 mmHg  Aortic Peak Gradient 22 mmHg  Aortic Valve Area 1.33  Aortic Value Area Index 0.73 cm2/BSA  RA A Wave 2 mmHg  RA V Wave 3 mmHg  RA Mean 2 mmHg  RV Systolic Pressure 25 mmHg  RV Diastolic Pressure 0 mmHg  RV EDP 3 mmHg  PA Systolic Pressure 21 mmHg  PA Diastolic Pressure 8 mmHg  PA Mean 13 mmHg  PW A Wave 9 mmHg  PW V Wave 8 mmHg  PW Mean 7 mmHg  AO Systolic Pressure 123456 mmHg  AO Diastolic Pressure 67 mmHg  AO Mean 99 mmHg  LV Systolic Pressure [id number] mmHg  LV Diastolic Pressure 0 mmHg  LV EDP 6 mmHg  AOp Systolic Pressure 123456 mmHg  AOp Diastolic Pressure 68 mmHg  AOp Mean Pressure [id number] mmHg  LVp Systolic Pressure [id number] mmHg  LVp Diastolic Pressure 0 mmHg  LVp EDP Pressure 6 mmHg  QP/QS 1  TPVR Index 3.63 HRUI  TSVR Index 27.61 HRUI  PVR SVR Ratio 0.06  TPVR/TSVR Ratio 0.13  CT ANGIOGRAPHY CHEST, ABDOMEN AND PELVIS  TECHNIQUE: Non-contrast CT of the chest was initially obtained.  Multidetector CT imaging through the chest, abdomen and pelvis was performed using the standard protocol during bolus administration of intravenous contrast. Multiplanar  reconstructed images and MIPs were obtained and reviewed to evaluate the vascular anatomy.  CONTRAST:  174mL OMNIPAQUE IOHEXOL 350 MG/ML SOLN  COMPARISON:  CT the abdomen and pelvis 11/28/2013.  FINDINGS: CTA CHEST FINDINGS  Cardiovascular: Heart size is normal. There is no significant pericardial fluid, thickening or pericardial calcification. There is aortic atherosclerosis, as well as atherosclerosis of the great vessels of the mediastinum and the coronary arteries, including calcified atherosclerotic plaque in the left main, left anterior descending, left circumflex and right coronary arteries. Severe thickening calcification of the aortic valve.  Mediastinum/Lymph Nodes: No pathologically enlarged mediastinal or hilar lymph nodes. Esophagus is unremarkable in appearance. No axillary lymphadenopathy.  Lungs/Pleura: No suspicious appearing pulmonary nodules or masses are noted. No acute consolidative airspace disease. No pleural effusions.  Musculoskeletal/Soft Tissues: There are no aggressive appearing lytic or blastic lesions noted in the visualized portions of the skeleton.  CTA ABDOMEN AND PELVIS FINDINGS  Hepatobiliary: No suspicious cystic or solid hepatic lesions. No intra or extrahepatic biliary ductal dilatation. Gallbladder is normal in appearance.  Pancreas: No pancreatic mass. No pancreatic ductal dilatation. No pancreatic or peripancreatic fluid collections or inflammatory changes.  Spleen: Unremarkable.  [place]/Urinary Tract: Multiple low-attenuation lesions in the kidneys bilaterally, compatible with simple cysts, measuring up to 2.2 cm in the upper pole the left kidney. Mild thickening of the left adrenal gland suggestive of hyperplasia. Right adrenal gland is normal in appearance. No hydroureteronephrosis. Urinary bladder is normal in appearance.  Stomach/Bowel: Normal appearance of the stomach. No pathologic dilatation of small  bowel or colon. Numerous colonic diverticulae are noted, particularly in the descending colon and sigmoid colon, without surrounding inflammatory changes to suggest an acute diverticulitis at this time. The appendix is not confidently identified and may be surgically absent. Regardless, there are no inflammatory changes noted adjacent to the cecum to suggest the presence of an acute appendicitis at this time.  Vascular/Lymphatic: Aortic atherosclerosis, without evidence of aneurysm or dissection in the abdominal or pelvic vasculature. Vascular findings and measurements pertinent to potential TAVR procedure, as detailed below. No lymphadenopathy noted in the abdomen or pelvis.  Reproductive: Prostate gland and seminal vesicles are unremarkable in appearance.  Other: No significant volume of ascites.  No pneumoperitoneum.  Musculoskeletal: There are no aggressive appearing lytic or blastic lesions noted in the visualized portions of the skeleton.  VASCULAR MEASUREMENTS PERTINENT TO TAVR:  AORTA:  Minimal Aortic Diameter-11 x 13 mm  Severity of Aortic Calcification-severe  RIGHT PELVIS:  Right Common Iliac Artery -  Minimal Diameter-9.6 x 8.9 mm  Tortuosity-mild  Calcification-moderate  Right External Iliac Artery -  Minimal Diameter-8.4 x 8.4 mm  Tortuosity-mild  Calcification-mild  Right Common Femoral Artery -  Minimal Diameter-8.3 x 7.6 mm  Tortuosity-mild  Calcification-mild  LEFT PELVIS:  Left Common Iliac Artery -  Minimal Diameter-8.7 x 8.3 mm  Tortuosity-mild  Calcification-moderate  Left External Iliac Artery -  Minimal Diameter-8.0 x 6.8 mm  Tortuosity-mild  Calcification-mild  Left Common Femoral Artery -  Minimal Diameter-8.8 x 6.0 mm  Tortuosity-mild  Calcification-mild  Review of the MIP images confirms the above findings.  IMPRESSION: 1. Vascular findings and measurements pertinent to  potential TAVR procedure, as detailed above. 2. Severe thickening calcification of the aortic valve, compatible with  reported clinical history of severe aortic stenosis. 3. Aortic atherosclerosis, in addition to left main and 3 vessel coronary artery disease. 4. Severe colonic diverticulosis without evidence of acute diverticulitis at this time. 5. Additional incidental findings, as above.   Electronically Signed   By: Vinnie Langton M.D.   On: 01/18/2020 11:49    Impression:  Patient has at least stage B and probably early stage D severe symptomatic aortic stenosis.  He tends to minimize symptoms but has reportedly had at least some intermittent symptoms of exertional shortness of breath as well as progressively decreased exercise tolerance with worsening fatigue consistent with chronic diastolic congestive heart failure, New York Heart Association functional class I-II.  He has also lost a fair amount of weight over the past year despite no reported loss of appetite.  I personally reviewed the patient's recent transthoracic echocardiogram, diagnostic cardiac catheterization, CT angiograms, and EKG.  The patient's aortic valve is trileaflet.  There is moderate thickening, calcification, and restricted leaflet mobility involving all 3 leaflets of the aortic valve.  Peak velocity across aortic valve has measured above 3.3 m/s corresponding to mean transvalvular gradient estimated close to 40 mmHg, all consistent with moderately severe and probably early severe aortic stenosis.  Diagnostic cardiac catheterization is notable for the presence of moderate nonobstructive coronary artery disease.  Mean transvalvular gradient across the aortic valve was measured 25 mmHg at catheterization.  Options include continued close observation on medical therapy versus elective aortic valve replacement.  I agree the patient might benefit from aortic valve replacement in the near future.  Risks associated  with conventional surgery would be somewhat elevated because of the patient's advanced age.  I am particularly concerned about the possibility that the patient may be developing significant cognitive dysfunction with some degree of worsening short-term memory loss reported by his wife.  Cardiac-gated CTA of the heart reveals anatomical characteristics consistent with aortic stenosis suitable for treatment by transcatheter aortic valve replacement without any significant complicating features and CTA of the aorta and iliac vessels demonstrate what appears to be adequate pelvic vascular access to facilitate a transfemoral approach.  EKG reveals sinus rhythm without significant AV conduction delay.  Under the circumstances I would favor transcatheter aortic valve replacement to conventional surgery.   Plan:  The patient and his wife were counseled at length regarding treatment alternatives for management of severe symptomatic aortic stenosis. Alternative approaches such as conventional aortic valve replacement, transcatheter aortic valve replacement, and continued medical therapy without intervention were compared and contrasted at length.  The risks associated with conventional surgical aortic valve replacement were discussed in detail, as were expectations for post-operative convalescence, and why I would be reluctant to consider this patient a candidate for conventional surgery.  Issues specific to transcatheter aortic valve replacement were discussed including questions about long term valve durability, the potential for paravalvular leak, possible increased risk of need for permanent pacemaker placement, and other technical complications related to the procedure itself.  Long-term prognosis with medical therapy was discussed. This discussion was placed in the context of the patient's own specific clinical presentation and past medical history.  All of their questions have been addressed.  The patient desires  to proceed with transcatheter aortic valve replacement in the near future.  We tentatively plan for surgery on January 31, 2020.  Following the decision to proceed with transcatheter aortic valve replacement, a discussion has been held regarding what types of management strategies would be attempted intraoperatively in the event of  life-threatening complications, including whether or not the patient would be considered a candidate for the use of cardiopulmonary bypass and/or conversion to open sternotomy for attempted surgical intervention.  The patient specifically requests that should a potentially life-threatening complication develop we would only attempt emergency median sternotomy and/or other aggressive surgical procedures if the patient had complications that were felt to be relatively straightforward to reverse.  The patient has been advised of a variety of complications that might develop including but not limited to risks of death, stroke, paravalvular leak, aortic dissection or other major vascular complications, aortic annulus rupture, device embolization, cardiac rupture or perforation, mitral regurgitation, acute myocardial infarction, arrhythmia, heart block or bradycardia requiring permanent pacemaker placement, congestive heart failure, respiratory failure, renal failure, pneumonia, infection, other late complications related to structural valve deterioration or migration, or other complications that might ultimately cause a temporary or permanent loss of functional independence or other long term morbidity.  The patient provides full informed consent for the procedure as described and all questions were answered.      I spent in excess of 90 minutes during the conduct of this office consultation and >50% of this time involved direct face-to-face encounter with the patient for counseling and/or coordination of their care.    Valentina Gu. Roxy Manns, MD 01/18/2020 3:01 PM

## 2020-01-19 ENCOUNTER — Ambulatory Visit: Payer: Medicare PPO | Admitting: Physical Therapy

## 2020-01-20 ENCOUNTER — Other Ambulatory Visit: Payer: Self-pay

## 2020-01-20 DIAGNOSIS — I350 Nonrheumatic aortic (valve) stenosis: Secondary | ICD-10-CM

## 2020-01-24 ENCOUNTER — Telehealth (HOSPITAL_COMMUNITY): Payer: Self-pay | Admitting: *Deleted

## 2020-01-24 NOTE — Telephone Encounter (Signed)
Error

## 2020-01-26 ENCOUNTER — Encounter: Payer: Self-pay | Admitting: Physical Therapy

## 2020-01-26 ENCOUNTER — Ambulatory Visit: Payer: Medicare PPO | Attending: Cardiovascular Disease | Admitting: Physical Therapy

## 2020-01-26 ENCOUNTER — Other Ambulatory Visit: Payer: Self-pay

## 2020-01-26 DIAGNOSIS — R269 Unspecified abnormalities of gait and mobility: Secondary | ICD-10-CM | POA: Insufficient documentation

## 2020-01-26 DIAGNOSIS — M6281 Muscle weakness (generalized): Secondary | ICD-10-CM

## 2020-01-26 NOTE — Progress Notes (Signed)
Your procedure is scheduled on Tuesday January 31, 2020.  Report to [facility] Main Entrance "A" at 08:30 A.M., and check in at the Admitting office.  Call this number if you have problems the morning of surgery: [phone]  Call [phone] if you have any questions prior to your surgery date Monday-Friday 8am-4pm   Remember: Do not eat or drink after midnight the night before your surgery   Per your surgeon:  >>Continue taking all current medications without change through the day before surgery.  >>On the evening prior to surgery please take only half of your normal dosage of Insulin. --- 6 units >>On the morning of surgery do not take any medications.  As of today, STOP taking any  Aleve, Naproxen, Ibuprofen, Motrin, Advil, Goody's, BC's, all herbal medications, fish oil, and all vitamins.    The Morning of Surgery  Do not wear jewelry  Do not wear lotions, powders, colognes, or deodorant  Men may shave face and neck.  Do not bring valuables to the hospital.  [facility] is not responsible for any belongings or valuables.  If you are a smoker, DO NOT Smoke 24 hours prior to surgery  If you wear a CPAP at night please bring your mask the morning of surgery   Remember that you must have someone to transport you home after your surgery, and remain with you for 24 hours if you are discharged the same day.   Please bring cases for contacts, glasses, hearing aids, dentures or bridgework because it cannot be worn into surgery.    Leave your suitcase in the car.  After surgery it may be brought to your room.  For patients admitted to the hospital, discharge time will be determined by your treatment team.  Patients discharged the day of surgery will not be allowed to drive home.    Special instructions:   [place]- Preparing For Surgery  Before surgery, you can play an important role. Because skin is not sterile, your skin needs to be as free of germs as possible. You  can reduce the number of germs on your skin by washing with CHG (chlorahexidine gluconate) Soap before surgery.  CHG is an antiseptic cleaner which kills germs and bonds with the skin to continue killing germs even after washing.    Oral Hygiene is also important to reduce your risk of infection.  Remember - BRUSH YOUR TEETH THE MORNING OF SURGERY WITH YOUR REGULAR TOOTHPASTE  Please do not use if you have an allergy to CHG or antibacterial soaps. If your skin becomes reddened/irritated stop using the CHG.  Do not shave (including legs and underarms) for at least 48 hours prior to first CHG shower. It is OK to shave your face.  Please follow these instructions carefully.   1. Shower the NIGHT BEFORE SURGERY and the MORNING OF SURGERY with CHG Soap.   2. If you chose to wash your hair and body, wash as usual with your normal shampoo and body-wash/soap.  3. Rinse your hair and body thoroughly to remove the shampoo and soap.  4. Apply CHG directly to the skin (ONLY FROM THE NECK DOWN) and wash gently with a scrungie or a clean washcloth.   5. Do not use on open wounds or open sores. Avoid contact with your eyes, ears, mouth and genitals (private parts). Wash Face and genitals (private parts)  with your normal soap.   6. Wash thoroughly, paying special attention to the area where your surgery  will be performed.  7. Thoroughly rinse your body with warm water from the neck down.  8. DO NOT shower/wash with your normal soap after using and rinsing off the CHG Soap.  9. Pat yourself dry with a CLEAN TOWEL.  10. Wear CLEAN PAJAMAS to bed the night before surgery  11. Place CLEAN SHEETS on your bed the night of your first shower and DO NOT SLEEP WITH PETS.  12. Wear comfortable clothes the morning of surgery.     Day of Surgery:  Please shower the morning of surgery with the CHG soap Do not apply any deodorants/lotions. Please wear clean clothes to the hospital/surgery center.    Remember to brush your teeth WITH YOUR REGULAR TOOTHPASTE.   Please read over the following fact sheets that you were given.

## 2020-01-26 NOTE — Therapy (Signed)
[place], [place], [address] Phone: [phone]   Fax:  [phone]  Physical Therapy Evaluation  Patient Details  Name: Brian [name] MRN: [id number] Date of Birth: April 26, 1934 Referring Provider (PT): Burnell Blanks, MD   Encounter Date: 01/26/2020  PT End of Session - 01/26/20 1245    Visit Number  1    Number of Visits  1    Date for PT Re-Evaluation  01/26/20    PT Start Time  1238    PT Stop Time  1310    PT Time Calculation (min)  32 min    Activity Tolerance  Patient tolerated treatment well    Behavior During Therapy  [facility] for tasks assessed/performed       Past Medical History:  Diagnosis Date  . Anemia of chronic renal failure 05/08/2015  . Anemia of chronic renal failure 05/08/2015  . Aortic stenosis, mild 09/21/2015  . Aortic stenosis, severe 09/21/2015  . Arthritis   . Bladder stone   . Chronic kidney disease (CKD), stage III (moderate) 05/08/2015   Followed by Dr. Rolly Salter   . Congestive heart failure ([place]) 03/14/2019  . Diastolic dysfunction without heart failure 06/08/2017  . Essential hypertension 05/08/2015  . History of bladder stone   . Hyperlipidemia   . Hypertension   . Nocturia   . Renal disorder   . Type 2 diabetes mellitus ([place])     Past Surgical History:  Procedure Laterality Date  . CYSTOSCOPY/RETROGRADE/URETEROSCOPY/STONE EXTRACTION WITH BASKET N/A 03/22/2013   Procedure: CYSTOSCOPY BLADDER STONE STONE EXTRACTION;  Surgeon: [name], MD;  Location: [place];  Service: Urology;  Laterality: N/A;  CYSTOSCOPY    . RIGHT/LEFT HEART CATH AND CORONARY ANGIOGRAPHY N/A 01/12/2020   Procedure: RIGHT/LEFT HEART CATH AND CORONARY ANGIOGRAPHY;  Surgeon: Burnell Blanks, MD;  Location: [facility];  Service: Cardiovascular;  Laterality: N/A;  . TONSILLECTOMY      There were no vitals filed for this visit.   Subjective  Assessment - 01/26/20 1243    Subjective  pt is a 86 y.o m with CC of general fatigue that started over a year that has progressively worsened since onset. reports having pain in both shoulders which he attributes is due to arhtritis    Patient Stated Goals  to fix the heart    Currently in Pain?  Yes    Pain Score  8     Pain Location  Shoulder    Pain Orientation  Right;Left    Pain Descriptors / Indicators  Aching;Sore    Pain Type  Chronic pain    Pain Frequency  Constant    Aggravating Factors   using bil UE reaching overhead, weather    Pain Relieving Factors  topical ointment/ rub         [facility] PT Assessment - 01/26/20 0001      Assessment   Medical Diagnosis  severe Aortic Stenosis    Referring Provider (PT)  Burnell Blanks, MD    Onset Date/Surgical Date  --   over a year   Hand Dominance  Right      Precautions   Precautions  None      Restrictions   Weight Bearing Restrictions  No      Balance Screen   Has the patient fallen in the past 6 months  No    Has the patient had a decrease in activity level because of a  fear of falling?   No    Is the patient reluctant to leave their home because of a fear of falling?   No      Home Film/video editor residence    Living Arrangements  Spouse/significant other    Available Help at Discharge  Family    Type of [place] to enter    Entrance Stairs-Number of Steps  10    Entrance Stairs-Rails  Right;Left    Home Layout  Two level    Alternate Level Stairs-Number of Steps  14    Alternate Level Stairs-Rails  Right;Left      Prior Function   Level of Independence  Independent    Vocation  Retired      ROM / Strength   AROM / PROM / Strength  AROM;Strength      AROM   Overall AROM   Within functional limits for tasks performed    Overall AROM Comments  BLE WFL, bil UE gross strength 3+/5      Strength   Overall Strength Comments  BLE WFL, bil UE gross  strength 3+/5    Right Hand Grip (lbs)  4    Left Hand Grip (lbs)  5      Ambulation/Gait   Gait Pattern  Step-through pattern   bil toe in gait with R>L      [facility] Pre-Surgical Assessment - 01/26/20 0001    5 Meter Walk Test- trial 1  5 sec    5 Meter Walk Test- trial 2  4 sec.     5 Meter Walk Test- trial 3  4 sec.    5 meter walk test average  4.33 sec    4 Stage Balance Test tolerated for:   10 sec.    4 Stage Balance Test Position  4    Sit To Stand Test- trial 1  13 sec.    ADL/IADL Independent with:  Bathing;Dressing;Meal prep;Finances;Yard work    ADL/IADL Therapist, sports Index  Vulnerable    6 Minute Walk- Baseline  yes    BP (mmHg)  122/75    HR (bpm)  93    02 Sat (%RA)  99 %    Modified Borg Scale for Dyspnea  0- Nothing at all    Perceived Rate of Exertion (Borg)  9- very light    6 Minute Walk Post Test  yes    BP (mmHg)  144/73    HR (bpm)  108    02 Sat (%RA)  95 %    Modified Borg Scale for Dyspnea  0- Nothing at all    Perceived Rate of Exertion (Borg)  9- very light    Aerobic Endurance Distance Walked  1368    Endurance additional comments  pt is 2.12% limited compared to age related norm                Objective measurements completed on examination: See above findings.                           Plan - 01/26/20 1245    Clinical Impression Statement  see assessment in note    Stability/Clinical Decision Making  Stable/Uncomplicated    Clinical Decision Making  Low    PT Frequency  One time visit    PT Next Visit Plan  PRE-TAVR evaluation    Consulted and  Agree with Plan of Care  Patient        Clinical Impression Statement: Pt is a 86 yo M presenting to OP PT for evaluation prior to possible TAVR surgery due to severe aortic stenosis. Pt reports onset of general fatigue approximately 12 months ago. Symptoms are limiting endurnace. Pt presents with functional ROM and bil LE strength, gross weakness in bil UE. good balance  and is  Assessed as low at high fall risk 4 stage balance test, good walking speed and good aerobic endurance per 6 minute walk test. Pt ambulated 1368 feet without requiring a rest break during 6 min walk test. At end of testing patient's HR was 108 bpm and O2 was 95 on room air. Pt reported 0/10 shortness of breath on modified scale for dyspnea. Pt ambulated a total of 1368 feet in 6 minute walk. General fatigue  increased with 6 minute walk test. Based on the Short Physical Performance Battery, patient has a frailty rating of 11/12 with </= 5/12 considered frail.    Patient demonstrated the following deficits and impairments:     Visit Diagnosis: Abnormal gait  Muscle weakness (generalized)     Problem List Patient Active Problem List   Diagnosis Date Noted  . Severe aortic stenosis   . Type 2 diabetes mellitus with stage 3 chronic kidney disease, with long-term current use of insulin ([place]) 12/29/2019  . Malnutrition ([place]) 12/29/2019  . Anemia 11/23/2019  . Congestive heart failure ([place]) 03/14/2019  . Uncontrolled type 2 diabetes mellitus with diabetic nephropathy ([place]) 02/22/2018  . Heart murmur 11/04/2017  . Erectile dysfunction 09/10/2017  . Diastolic dysfunction without heart failure 06/08/2017  . Aortic stenosis, severe 09/21/2015  . Essential hypertension 05/08/2015  . Hyperlipidemia LDL goal <100 05/08/2015  . Diverticulosis of colon 05/08/2015  . Secondary hyperparathyroidism of renal origin ([place]) 05/08/2015  . Chronic kidney disease (CKD), stage III (moderate) 05/08/2015  . Irritable bowel syndrome with constipation 05/08/2015  . Benign localized prostatic hyperplasia with lower urinary tract symptoms (LUTS) 05/08/2015   Starr Lake PT, DPT, LAT, ATC  01/26/20  1:18 PM      [place] [facility] [address] [place], [place], [address] Phone: [phone]   Fax:  [phone]  Name: WALDEN CROFOOT MRN:  [id number] Date of Birth: [birth year]

## 2020-01-27 ENCOUNTER — Ambulatory Visit (HOSPITAL_COMMUNITY)
Admission: RE | Admit: 2020-01-27 | Discharge: 2020-01-27 | Disposition: A | Discharge status: Home / Self Care | Payer: Medicare PPO | Source: Ambulatory Visit | Attending: Cardiovascular Disease | Admitting: Cardiovascular Disease

## 2020-01-27 ENCOUNTER — Other Ambulatory Visit: Payer: Self-pay

## 2020-01-27 ENCOUNTER — Encounter (HOSPITAL_COMMUNITY)
Admission: RE | Admit: 2020-01-27 | Discharge: 2020-01-27 | Disposition: A | Discharge status: Home / Self Care | Payer: Medicare PPO | Source: Ambulatory Visit | Attending: Cardiovascular Disease | Admitting: Cardiovascular Disease

## 2020-01-27 ENCOUNTER — Other Ambulatory Visit (HOSPITAL_COMMUNITY)
Admission: RE | Admit: 2020-01-27 | Discharge: 2020-01-27 | Disposition: A | Discharge status: Home / Self Care | Payer: Medicare PPO | Source: Ambulatory Visit | Attending: Cardiovascular Disease | Admitting: Cardiovascular Disease

## 2020-01-27 ENCOUNTER — Encounter (HOSPITAL_COMMUNITY): Payer: Self-pay

## 2020-01-27 DIAGNOSIS — N183 Chronic kidney disease, stage 3 unspecified: Secondary | ICD-10-CM | POA: Diagnosis not present

## 2020-01-27 DIAGNOSIS — J449 Chronic obstructive pulmonary disease, unspecified: Secondary | ICD-10-CM | POA: Diagnosis not present

## 2020-01-27 DIAGNOSIS — I130 Hypertensive heart and chronic kidney disease with heart failure and stage 1 through stage 4 chronic kidney disease, or unspecified chronic kidney disease: Secondary | ICD-10-CM | POA: Diagnosis not present

## 2020-01-27 DIAGNOSIS — Z20822 Contact with and (suspected) exposure to covid-19: Secondary | ICD-10-CM | POA: Insufficient documentation

## 2020-01-27 DIAGNOSIS — E1122 Type 2 diabetes mellitus with diabetic chronic kidney disease: Secondary | ICD-10-CM | POA: Diagnosis not present

## 2020-01-27 DIAGNOSIS — I509 Heart failure, unspecified: Secondary | ICD-10-CM | POA: Insufficient documentation

## 2020-01-27 DIAGNOSIS — Z87891 Personal history of nicotine dependence: Secondary | ICD-10-CM | POA: Diagnosis not present

## 2020-01-27 DIAGNOSIS — I350 Nonrheumatic aortic (valve) stenosis: Secondary | ICD-10-CM | POA: Diagnosis not present

## 2020-01-27 HISTORY — DX: Unspecified dementia, unspecified severity, without behavioral disturbance, psychotic disturbance, mood disturbance, and anxiety: F03.90

## 2020-01-27 LAB — COMPREHENSIVE METABOLIC PANEL
ALT: 10 U/L (ref 0–44)
AST: 15 U/L (ref 15–41)
Albumin: 3.3 g/dL — ABNORMAL LOW (ref 3.5–5.0)
Alkaline Phosphatase: 49 U/L (ref 38–126)
Anion gap: 12 (ref 5–15)
BUN: 14 mg/dL (ref 8–23)
CO2: 20 mmol/L — ABNORMAL LOW (ref 22–32)
Calcium: 9.1 mg/dL (ref 8.9–10.3)
Chloride: 105 mmol/L (ref 98–111)
Creatinine, Ser: 1.1 mg/dL (ref 0.61–1.24)
GFR calc Af Amer: >60 mL/min (ref >60)
GFR calc non Af Amer: >60 mL/min (ref >60)
Glucose, Bld: 287 mg/dL — ABNORMAL HIGH (ref 70–99)
Potassium: 3.8 mmol/L (ref 3.5–5.1)
Sodium: 137 mmol/L (ref 135–145)
Total Bilirubin: 0.4 mg/dL (ref 0.3–1.2)
Total Protein: 6.6 g/dL (ref 6.5–8.1)

## 2020-01-27 LAB — TYPE AND SCREEN
ABO/RH(D): O POS
Antibody Screen: NEGATIVE

## 2020-01-27 LAB — URINALYSIS, ROUTINE W REFLEX MICROSCOPIC
Bacteria, UA: NONE SEEN
Bilirubin Urine: NEGATIVE
Glucose, UA: >=500 mg/dL — AB
Hgb urine dipstick: NEGATIVE
Ketones, ur: NEGATIVE mg/dL
Leukocytes,Ua: NEGATIVE
Nitrite: NEGATIVE
Protein, ur: NEGATIVE mg/dL
Specific Gravity, Urine: 1.024 (ref 1.005–1.030)
pH: 5 (ref 5.0–8.0)

## 2020-01-27 LAB — CBC
HCT: 32.1 % — ABNORMAL LOW (ref 39.0–52.0)
Hemoglobin: 10.1 g/dL — ABNORMAL LOW (ref 13.0–17.0)
MCH: 28.2 pg (ref 26.0–34.0)
MCHC: 31.5 g/dL (ref 30.0–36.0)
MCV: 89.7 fL (ref 80.0–100.0)
Platelets: 315 10*3/uL (ref 150–400)
RBC: 3.58 MIL/uL — ABNORMAL LOW (ref 4.22–5.81)
RDW: 15 % (ref 11.5–15.5)
WBC: 5.7 10*3/uL (ref 4.0–10.5)
nRBC: 0 % (ref 0.0–0.2)

## 2020-01-27 LAB — BLOOD GAS, ARTERIAL
Acid-base deficit: 1.7 mmol/L (ref 0.0–2.0)
Bicarbonate: 22.2 mmol/L (ref 20.0–28.0)
Drawn by: 58793
FIO2: 21
O2 Saturation: 99.1 %
Patient temperature: 37
pCO2 arterial: 35.1 mmHg (ref 32.0–48.0)
pH, Arterial: 7.417 (ref 7.350–7.450)
pO2, Arterial: 152 mmHg — ABNORMAL HIGH (ref 83.0–108.0)

## 2020-01-27 LAB — BRAIN NATRIURETIC PEPTIDE: B Natriuretic Peptide: 53.3 pg/mL (ref 0.0–100.0)

## 2020-01-27 LAB — SURGICAL PCR SCREEN
MRSA, PCR: NEGATIVE
Staphylococcus aureus: POSITIVE — AB

## 2020-01-27 LAB — HEMOGLOBIN A1C
Hgb A1c MFr Bld: 7.8 % — ABNORMAL HIGH (ref 4.8–5.6)
Mean Plasma Glucose: 177.16 mg/dL

## 2020-01-27 LAB — GLUCOSE, CAPILLARY: Glucose-Capillary: 261 mg/dL — ABNORMAL HIGH (ref 70–99)

## 2020-01-27 LAB — PROTIME-INR
INR: 1.1 (ref 0.8–1.2)
Prothrombin Time: 13.4 seconds (ref 11.4–15.2)

## 2020-01-27 LAB — SARS CORONAVIRUS 2 (TAT 6-24 HRS): SARS Coronavirus 2: NEGATIVE

## 2020-01-27 LAB — APTT: aPTT: 42 seconds — ABNORMAL HIGH (ref 24–36)

## 2020-01-27 NOTE — Progress Notes (Signed)
PCP - Delsa Grana  Cardiologist - Lewis tiffany  Chest x-ray - 01/27/20  EKG - 01/27/20 Stress Test - 09/28/15 ECHO - 11/27/19 Cardiac Cath - 01/12/20   Fasting Blood Sugar - 100-130s Checks Blood Sugar ___2__ times a day  Aspirin Instructions: continue   COVID TEST- 01/27/20   Anesthesia review: yes, patient has cardiac hx  Also patients wife will need to be present on DOS in Short Stay,  Patient has early start of dementia but is still able to make decisions for himself.  patient is more comfortable when his wife is present.  Patient almost left during his PAT appt before getting his chest xray and lab work.  I told the patient I would go out to find his wife and that he could go to the bathroom to give his urine sample.  While I left to find his wife. He left the PAT department himself to find his wife.  I found him out in the hallway with his wife.   He just wanted to make sure that his wife got the information that I had given him during the appointment. He wanted to make sure they both understood the instructions. I feel that it will help the paient feel more comfortable with her present during pre-op.  A history of dementia was not documented in his chart but the wife stated that he is in early stages.   Patient denies shortness of breath, fever, cough and chest pain at PAT appointment   All instructions explained to the patient, with a verbal understanding of the material. Patient agrees to go over the instructions while at home for a better understanding. Patient also instructed to self quarantine after being tested for COVID-19. The opportunity to ask questions was provided.

## 2020-01-27 NOTE — Progress Notes (Signed)
[id number] Brian [name], [place] [address] Phone: [phone] Fax: [phone]      Your procedure is scheduled on Tuesday June 8   Report to [facility] Main Entrance "A" at 0830 A.M., and check in at the Admitting office.  Call this number if you have problems the morning of surgery:  [phone]  Call [phone] if you have any questions prior to your surgery date Monday-Friday 8am-4pm    Remember:  Do not eat or drink after midnight the night before your surgery    There are No medications that you need to take the day of surgery per your surgeon   As of today, STOP taking any Aleve, Naproxen, Ibuprofen, Motrin, Advil, Goody's, BC's, all herbal medications, fish oil, and all vitamins.   WHAT DO I DO ABOUT MY DIABETES MEDICATION?   . THE NIGHT BEFORE SURGERY, take ____6_______ units of __Insulin Detemir (LEVEMIR FLEXTOUCH)_________insulin.  Per your Surgeon     HOW TO [place] AND AFTER SURGERY  Why is it important to control my blood sugar before and after surgery? . Improving blood sugar levels before and after surgery helps healing and can limit problems. . A way of improving blood sugar control is eating a healthy diet by: o  Eating less sugar and carbohydrates o  Increasing activity/exercise o  Talking with your doctor about reaching your blood sugar goals . High blood sugars (greater than 180 mg/dL) can raise your risk of infections and slow your recovery, so you will need to focus on controlling your diabetes during the weeks before surgery. . Make sure that the doctor who takes care of your diabetes knows about your planned surgery including the date and location.  How do I manage my blood sugar before surgery? . Check your blood sugar at least 4 times a day, starting 2 days before surgery, to make sure that the level is not too high or low. . Check your blood sugar the morning of your  surgery when you wake up and every 2 hours until you get to the Short Stay unit. o If your blood sugar is less than 70 mg/dL, you will need to treat for low blood sugar: - Do not take insulin. - Treat a low blood sugar (less than 70 mg/dL) with  cup of clear juice (cranberry or apple), 4 glucose tablets, OR glucose gel. - Recheck blood sugar in 15 minutes after treatment (to make sure it is greater than 70 mg/dL). If your blood sugar is not greater than 70 mg/dL on recheck, call [phone] for further instructions. . Report your blood sugar to the short stay nurse when you get to Short Stay.  . If you are admitted to the hospital after surgery: o Your blood sugar will be checked by the staff and you will probably be given insulin after surgery (instead of oral diabetes medicines) to make sure you have good blood sugar levels. o The goal for blood sugar control after surgery is 80-180 mg/dL.                      Do not wear jewelry, make up, or nail polish            Do not wear lotions, powders, perfumes/colognes, or deodorant.            Do not shave 48 hours prior to surgery.  Men may shave face and neck.  Do not bring valuables to the hospital.            [facility] is not responsible for any belongings or valuables.  Do NOT Smoke (Tobacco/Vapping) or drink Alcohol 24 hours prior to your procedure If you use a CPAP at night, you may bring all equipment for your overnight stay.   Contacts, glasses, dentures or bridgework may not be worn into surgery.      For patients admitted to the hospital, discharge time will be determined by your treatment team.   Patients discharged the day of surgery will not be allowed to drive home, and someone needs to stay with them for 24 hours.    Special instructions:   [place]- Preparing For Surgery  Before surgery, you can play an important role. Because skin is not sterile, your skin needs to be as free of germs as possible. You  can reduce the number of germs on your skin by washing with CHG (chlorahexidine gluconate) Soap before surgery.  CHG is an antiseptic cleaner which kills germs and bonds with the skin to continue killing germs even after washing.    Oral Hygiene is also important to reduce your risk of infection.  Remember - BRUSH YOUR TEETH THE MORNING OF SURGERY WITH YOUR REGULAR TOOTHPASTE  Please do not use if you have an allergy to CHG or antibacterial soaps. If your skin becomes reddened/irritated stop using the CHG.  Do not shave (including legs and underarms) for at least 48 hours prior to first CHG shower. It is OK to shave your face.  Please follow these instructions carefully.   1. Shower the NIGHT BEFORE SURGERY and the MORNING OF SURGERY with CHG Soap.   2. If you chose to wash your hair, wash your hair first as usual with your normal shampoo.  3. After you shampoo, rinse your hair and body thoroughly to remove the shampoo.  4. Use CHG as you would any other liquid soap. You can apply CHG directly to the skin and wash gently with a scrungie or a clean washcloth.   5. Apply the CHG Soap to your body ONLY FROM THE NECK DOWN.  Do not use on open wounds or open sores. Avoid contact with your eyes, ears, mouth and genitals (private parts). Wash Face and genitals (private parts)  with your normal soap.   6. Wash thoroughly, paying special attention to the area where your surgery will be performed.  7. Thoroughly rinse your body with warm water from the neck down.  8. DO NOT shower/wash with your normal soap after using and rinsing off the CHG Soap.  9. Pat yourself dry with a CLEAN TOWEL.  10. Wear CLEAN PAJAMAS to bed the night before surgery, wear comfortable clothes the morning of surgery  11. Place CLEAN SHEETS on your bed the night of your first shower and DO NOT SLEEP WITH PETS.   Day of Surgery:   Do not apply any deodorants/lotions.  Please wear clean clothes to the hospital/surgery  center.   Remember to brush your teeth WITH YOUR REGULAR TOOTHPASTE.   Please read over the following fact sheets that you were given.

## 2020-01-30 MED ORDER — MAGNESIUM SULFATE 50 % IJ SOLN
40.0000 meq | INTRAMUSCULAR | Status: DC
  Filled 2020-01-30: qty 9.85

## 2020-01-30 MED ORDER — DEXMEDETOMIDINE HCL IN NACL 400 MCG/100ML IV SOLN
0.1000 ug/kg/h | INTRAVENOUS | Status: AC
  Administered 2020-01-31: 1 ug/kg/h via INTRAVENOUS
  Administered 2020-01-31: 64.1 ug via INTRAVENOUS
  Filled 2020-01-30 (×2): qty 100

## 2020-01-30 MED ORDER — VANCOMYCIN HCL 1250 MG/250ML IV SOLN
1250.0000 mg | INTRAVENOUS | Status: AC
  Administered 2020-01-31: 1250 mg via INTRAVENOUS
  Filled 2020-01-30 (×2): qty 250

## 2020-01-30 MED ORDER — SODIUM CHLORIDE 0.9 % IV SOLN
INTRAVENOUS | Status: DC
  Filled 2020-01-30: qty 30

## 2020-01-30 MED ORDER — SODIUM CHLORIDE 0.9 % IV SOLN
1.5000 g | INTRAVENOUS | Status: AC
  Administered 2020-01-31: 1.5 g via INTRAVENOUS
  Filled 2020-01-30: qty 1.5

## 2020-01-30 MED ORDER — NOREPINEPHRINE 4 MG/250ML-% IV SOLN
0.0000 ug/min | INTRAVENOUS | Status: DC
  Filled 2020-01-30: qty 250

## 2020-01-30 MED ORDER — POTASSIUM CHLORIDE 2 MEQ/ML IV SOLN
80.0000 meq | INTRAVENOUS | Status: DC
  Filled 2020-01-30: qty 40

## 2020-01-31 ENCOUNTER — Inpatient Hospital Stay (HOSPITAL_COMMUNITY): Payer: Medicare PPO | Admitting: Certified Registered"

## 2020-01-31 ENCOUNTER — Other Ambulatory Visit: Payer: Self-pay | Admitting: Physician Assistant

## 2020-01-31 ENCOUNTER — Inpatient Hospital Stay (HOSPITAL_COMMUNITY)
Admission: RE | Admit: 2020-01-31 | Discharge: 2020-02-01 | DRG: 267 | Disposition: A | Discharge status: Home / Self Care | Payer: Medicare PPO | Attending: Thoracic Surgery (Cardiothoracic Vascular Surgery) | Admitting: Thoracic Surgery (Cardiothoracic Vascular Surgery)

## 2020-01-31 ENCOUNTER — Inpatient Hospital Stay (HOSPITAL_COMMUNITY): Payer: Medicare PPO | Admitting: Physician Assistant

## 2020-01-31 ENCOUNTER — Encounter (HOSPITAL_COMMUNITY)
Admission: RE | Disposition: A | Discharge status: Home / Self Care | Payer: Self-pay | Source: Home / Self Care | Attending: Thoracic Surgery (Cardiothoracic Vascular Surgery)

## 2020-01-31 ENCOUNTER — Encounter (HOSPITAL_COMMUNITY): Payer: Self-pay | Admitting: Cardiovascular Disease

## 2020-01-31 ENCOUNTER — Ambulatory Visit (HOSPITAL_COMMUNITY): Payer: Medicare PPO

## 2020-01-31 ENCOUNTER — Other Ambulatory Visit: Payer: Self-pay

## 2020-01-31 ENCOUNTER — Inpatient Hospital Stay (HOSPITAL_COMMUNITY): Payer: Medicare PPO

## 2020-01-31 DIAGNOSIS — N400 Enlarged prostate without lower urinary tract symptoms: Secondary | ICD-10-CM | POA: Diagnosis present

## 2020-01-31 DIAGNOSIS — Z952 Presence of prosthetic heart valve: Secondary | ICD-10-CM

## 2020-01-31 DIAGNOSIS — Z91013 Allergy to seafood: Secondary | ICD-10-CM

## 2020-01-31 DIAGNOSIS — I350 Nonrheumatic aortic (valve) stenosis: Secondary | ICD-10-CM | POA: Diagnosis not present

## 2020-01-31 DIAGNOSIS — I5189 Other ill-defined heart diseases: Secondary | ICD-10-CM

## 2020-01-31 DIAGNOSIS — I10 Essential (primary) hypertension: Secondary | ICD-10-CM | POA: Diagnosis present

## 2020-01-31 DIAGNOSIS — Z83438 Family history of other disorder of lipoprotein metabolism and other lipidemia: Secondary | ICD-10-CM

## 2020-01-31 DIAGNOSIS — I5032 Chronic diastolic (congestive) heart failure: Secondary | ICD-10-CM | POA: Diagnosis not present

## 2020-01-31 DIAGNOSIS — Z8249 Family history of ischemic heart disease and other diseases of the circulatory system: Secondary | ICD-10-CM | POA: Diagnosis not present

## 2020-01-31 DIAGNOSIS — Z7982 Long term (current) use of aspirin: Secondary | ICD-10-CM

## 2020-01-31 DIAGNOSIS — N183 Chronic kidney disease, stage 3 unspecified: Secondary | ICD-10-CM | POA: Diagnosis present

## 2020-01-31 DIAGNOSIS — Z833 Family history of diabetes mellitus: Secondary | ICD-10-CM

## 2020-01-31 DIAGNOSIS — I2510 Atherosclerotic heart disease of native coronary artery without angina pectoris: Secondary | ICD-10-CM | POA: Diagnosis not present

## 2020-01-31 DIAGNOSIS — N401 Enlarged prostate with lower urinary tract symptoms: Secondary | ICD-10-CM | POA: Diagnosis present

## 2020-01-31 DIAGNOSIS — I130 Hypertensive heart and chronic kidney disease with heart failure and stage 1 through stage 4 chronic kidney disease, or unspecified chronic kidney disease: Secondary | ICD-10-CM | POA: Diagnosis present

## 2020-01-31 DIAGNOSIS — Z006 Encounter for examination for normal comparison and control in clinical research program: Secondary | ICD-10-CM

## 2020-01-31 DIAGNOSIS — Z882 Allergy status to sulfonamides status: Secondary | ICD-10-CM

## 2020-01-31 DIAGNOSIS — R2689 Other abnormalities of gait and mobility: Secondary | ICD-10-CM | POA: Diagnosis present

## 2020-01-31 DIAGNOSIS — E119 Type 2 diabetes mellitus without complications: Secondary | ICD-10-CM

## 2020-01-31 DIAGNOSIS — Z87891 Personal history of nicotine dependence: Secondary | ICD-10-CM | POA: Diagnosis not present

## 2020-01-31 DIAGNOSIS — M1990 Unspecified osteoarthritis, unspecified site: Secondary | ICD-10-CM | POA: Diagnosis present

## 2020-01-31 DIAGNOSIS — E1122 Type 2 diabetes mellitus with diabetic chronic kidney disease: Secondary | ICD-10-CM | POA: Diagnosis not present

## 2020-01-31 DIAGNOSIS — Z794 Long term (current) use of insulin: Secondary | ICD-10-CM | POA: Diagnosis not present

## 2020-01-31 DIAGNOSIS — E785 Hyperlipidemia, unspecified: Secondary | ICD-10-CM | POA: Diagnosis present

## 2020-01-31 DIAGNOSIS — D638 Anemia in other chronic diseases classified elsewhere: Secondary | ICD-10-CM | POA: Diagnosis present

## 2020-01-31 DIAGNOSIS — F0390 Unspecified dementia without behavioral disturbance: Secondary | ICD-10-CM | POA: Diagnosis not present

## 2020-01-31 DIAGNOSIS — D631 Anemia in chronic kidney disease: Secondary | ICD-10-CM | POA: Diagnosis not present

## 2020-01-31 DIAGNOSIS — Z79899 Other long term (current) drug therapy: Secondary | ICD-10-CM | POA: Diagnosis not present

## 2020-01-31 DIAGNOSIS — Z888 Allergy status to other drugs, medicaments and biological substances status: Secondary | ICD-10-CM | POA: Diagnosis not present

## 2020-01-31 DIAGNOSIS — N1832 Chronic kidney disease, stage 3b: Secondary | ICD-10-CM | POA: Diagnosis present

## 2020-01-31 DIAGNOSIS — D649 Anemia, unspecified: Secondary | ICD-10-CM | POA: Diagnosis present

## 2020-01-31 HISTORY — DX: Presence of prosthetic heart valve: Z95.2

## 2020-01-31 HISTORY — PX: INTRAOPERATIVE TRANSTHORACIC ECHOCARDIOGRAM: SHX6523

## 2020-01-31 HISTORY — PX: TRANSCATHETER AORTIC VALVE REPLACEMENT, TRANSFEMORAL: SHX6400

## 2020-01-31 LAB — POCT I-STAT, CHEM 8
BUN: 14 mg/dL (ref 8–23)
BUN: 14 mg/dL (ref 8–23)
BUN: 14 mg/dL (ref 8–23)
Calcium, Ion: 1.3 mmol/L (ref 1.15–1.40)
Calcium, Ion: 1.28 mmol/L (ref 1.15–1.40)
Calcium, Ion: 1.31 mmol/L (ref 1.15–1.40)
Chloride: 104 mmol/L (ref 98–111)
Chloride: 105 mmol/L (ref 98–111)
Chloride: 105 mmol/L (ref 98–111)
Creatinine, Ser: 1 mg/dL (ref 0.61–1.24)
Creatinine, Ser: 0.9 mg/dL (ref 0.61–1.24)
Creatinine, Ser: 0.9 mg/dL (ref 0.61–1.24)
Glucose, Bld: 204 mg/dL — ABNORMAL HIGH (ref 70–99)
Glucose, Bld: 189 mg/dL — ABNORMAL HIGH (ref 70–99)
Glucose, Bld: 193 mg/dL — ABNORMAL HIGH (ref 70–99)
HCT: 27 % — ABNORMAL LOW (ref 39.0–52.0)
HCT: 25 % — ABNORMAL LOW (ref 39.0–52.0)
HCT: 26 % — ABNORMAL LOW (ref 39.0–52.0)
Hemoglobin: 9.2 g/dL — ABNORMAL LOW (ref 13.0–17.0)
Hemoglobin: 8.5 g/dL — ABNORMAL LOW (ref 13.0–17.0)
Hemoglobin: 8.8 g/dL — ABNORMAL LOW (ref 13.0–17.0)
Potassium: 3.9 mmol/L (ref 3.5–5.1)
Potassium: 4 mmol/L (ref 3.5–5.1)
Potassium: 4.2 mmol/L (ref 3.5–5.1)
Sodium: 139 mmol/L (ref 135–145)
Sodium: 141 mmol/L (ref 135–145)
Sodium: 140 mmol/L (ref 135–145)
TCO2: 25 mmol/L (ref 22–32)
TCO2: 22 mmol/L (ref 22–32)
TCO2: 23 mmol/L (ref 22–32)

## 2020-01-31 LAB — GLUCOSE, CAPILLARY
Glucose-Capillary: 178 mg/dL — ABNORMAL HIGH (ref 70–99)
Glucose-Capillary: 148 mg/dL — ABNORMAL HIGH (ref 70–99)
Glucose-Capillary: 127 mg/dL — ABNORMAL HIGH (ref 70–99)

## 2020-01-31 SURGERY — IMPLANTATION, AORTIC VALVE, TRANSCATHETER, FEMORAL APPROACH
Anesthesia: Monitor Anesthesia Care

## 2020-01-31 MED ORDER — SODIUM CHLORIDE 0.9 % IV SOLN
INTRAVENOUS | Status: DC | PRN
  Administered 2020-01-31: 1500 mL

## 2020-01-31 MED ORDER — SODIUM CHLORIDE 0.9 % IV SOLN: INTRAVENOUS | Status: DC

## 2020-01-31 MED ORDER — LIDOCAINE HCL 1 % IJ SOLN
INTRAMUSCULAR | Status: DC | PRN
  Administered 2020-01-31: 5 mL

## 2020-01-31 MED ORDER — MUPIROCIN 2 % EX OINT
1.0000 "application " | TOPICAL_OINTMENT | Freq: Two times a day (BID) | CUTANEOUS | Status: DC
  Administered 2020-01-31: 1 via TOPICAL
  Filled 2020-01-31: qty 22

## 2020-01-31 MED ORDER — DOCUSATE SODIUM 100 MG PO CAPS: 100.0000 mg | ORAL_CAPSULE | Freq: Two times a day (BID) | ORAL | Status: DC | PRN

## 2020-01-31 MED ORDER — OXYCODONE HCL 5 MG PO TABS: 5.0000 mg | ORAL_TABLET | ORAL | Status: DC | PRN

## 2020-01-31 MED ORDER — ONDANSETRON HCL 4 MG/2ML IJ SOLN: 4.0000 mg | Freq: Four times a day (QID) | INTRAMUSCULAR | Status: DC | PRN

## 2020-01-31 MED ORDER — CHLORHEXIDINE GLUCONATE 0.12 % MT SOLN: 15.0000 mL | Freq: Once | OROMUCOSAL | Status: DC

## 2020-01-31 MED ORDER — IODIXANOL 320 MG/ML IV SOLN
INTRAVENOUS | Status: DC | PRN
  Administered 2020-01-31: 40 mL

## 2020-01-31 MED ORDER — ORAL CARE MOUTH RINSE: 15.0000 mL | Freq: Once | OROMUCOSAL | Status: AC

## 2020-01-31 MED ORDER — SODIUM CHLORIDE 0.9% FLUSH
3.0000 mL | Freq: Two times a day (BID) | INTRAVENOUS | Status: DC
  Administered 2020-01-31: 3 mL via INTRAVENOUS

## 2020-01-31 MED ORDER — MORPHINE SULFATE (PF) 2 MG/ML IV SOLN: 1.0000 mg | INTRAVENOUS | Status: DC | PRN

## 2020-01-31 MED ORDER — SODIUM CHLORIDE 0.9 % IV SOLN
1.5000 g | Freq: Two times a day (BID) | INTRAVENOUS | Status: DC
  Administered 2020-01-31 – 2020-02-01 (×2): 1.5 g via INTRAVENOUS
  Filled 2020-01-31 (×4): qty 1.5

## 2020-01-31 MED ORDER — CHLORHEXIDINE GLUCONATE 4 % EX LIQD: 30.0000 mL | CUTANEOUS | Status: DC

## 2020-01-31 MED ORDER — PROPOFOL 500 MG/50ML IV EMUL
INTRAVENOUS | Status: DC | PRN
  Administered 2020-01-31: 25 ug/kg/min via INTRAVENOUS

## 2020-01-31 MED ORDER — TAMSULOSIN HCL 0.4 MG PO CAPS
0.4000 mg | ORAL_CAPSULE | Freq: Every day | ORAL | Status: DC
  Administered 2020-01-31 – 2020-02-01 (×2): 0.4 mg via ORAL
  Filled 2020-01-31 (×2): qty 1

## 2020-01-31 MED ORDER — LACTATED RINGERS IV SOLN
INTRAVENOUS | Status: DC | PRN
Start: 2020-01-31 — End: 2020-01-31

## 2020-01-31 MED ORDER — PROTAMINE SULFATE 10 MG/ML IV SOLN
INTRAVENOUS | Status: DC | PRN
Start: 2020-01-31 — End: 2020-01-31
  Administered 2020-01-31: 100 mg via INTRAVENOUS

## 2020-01-31 MED ORDER — INSULIN ASPART 100 UNIT/ML ~~LOC~~ SOLN
0.0000 [IU] | Freq: Three times a day (TID) | SUBCUTANEOUS | Status: DC
  Administered 2020-01-31: 2 [IU] via SUBCUTANEOUS
  Administered 2020-02-01: 8 [IU] via SUBCUTANEOUS
  Administered 2020-02-01: 4 [IU] via SUBCUTANEOUS

## 2020-01-31 MED ORDER — SODIUM CHLORIDE 0.9 % IV SOLN
INTRAVENOUS | Status: AC
  Filled 2020-01-31 (×3): qty 1.2

## 2020-01-31 MED ORDER — ACETAMINOPHEN 325 MG PO TABS
650.0000 mg | ORAL_TABLET | Freq: Four times a day (QID) | ORAL | Status: DC | PRN
  Administered 2020-02-01: 650 mg via ORAL
  Filled 2020-01-31: qty 2

## 2020-01-31 MED ORDER — SODIUM CHLORIDE 0.9% FLUSH: 3.0000 mL | INTRAVENOUS | Status: DC | PRN

## 2020-01-31 MED ORDER — CLOPIDOGREL BISULFATE 75 MG PO TABS
75.0000 mg | ORAL_TABLET | Freq: Every day | ORAL | Status: DC
  Administered 2020-02-01: 75 mg via ORAL
  Filled 2020-01-31: qty 1

## 2020-01-31 MED ORDER — CHLORHEXIDINE GLUCONATE 4 % EX LIQD: 60.0000 mL | Freq: Once | CUTANEOUS | Status: DC

## 2020-01-31 MED ORDER — ROSUVASTATIN CALCIUM 5 MG PO TABS: 5.0000 mg | ORAL_TABLET | ORAL | Status: DC

## 2020-01-31 MED ORDER — ASPIRIN EC 81 MG PO TBEC
81.0000 mg | DELAYED_RELEASE_TABLET | Freq: Every day | ORAL | Status: DC
  Administered 2020-01-31 – 2020-02-01 (×2): 81 mg via ORAL
  Filled 2020-01-31 (×2): qty 1

## 2020-01-31 MED ORDER — HEPARIN SODIUM (PORCINE) 1000 UNIT/ML IJ SOLN
INTRAMUSCULAR | Status: DC | PRN
  Administered 2020-01-31: 10000 [IU] via INTRAVENOUS

## 2020-01-31 MED ORDER — FINASTERIDE 5 MG PO TABS
5.0000 mg | ORAL_TABLET | Freq: Every day | ORAL | Status: DC
  Administered 2020-01-31 – 2020-02-01 (×2): 5 mg via ORAL
  Filled 2020-01-31 (×2): qty 1

## 2020-01-31 MED ORDER — TRAMADOL HCL 50 MG PO TABS: 50.0000 mg | ORAL_TABLET | ORAL | Status: DC | PRN

## 2020-01-31 MED ORDER — ALBUMIN HUMAN 5 % IV SOLN: INTRAVENOUS | Status: DC | PRN

## 2020-01-31 MED ORDER — SODIUM CHLORIDE 0.9 % IV SOLN: 250.0000 mL | INTRAVENOUS | Status: DC | PRN

## 2020-01-31 MED ORDER — SODIUM CHLORIDE 0.9 % IV SOLN: INTRAVENOUS | Status: AC

## 2020-01-31 MED ORDER — CHLORHEXIDINE GLUCONATE 0.12 % MT SOLN
OROMUCOSAL | Status: AC
  Administered 2020-01-31: 15 mL via OROMUCOSAL
  Filled 2020-01-31: qty 15

## 2020-01-31 MED ORDER — CHLORHEXIDINE GLUCONATE 0.12 % MT SOLN: 15.0000 mL | Freq: Once | OROMUCOSAL | Status: AC

## 2020-01-31 MED ORDER — 0.9 % SODIUM CHLORIDE (POUR BTL) OPTIME
TOPICAL | Status: DC | PRN
  Administered 2020-01-31: 1000 mL

## 2020-01-31 MED ORDER — NITROGLYCERIN IN D5W 200-5 MCG/ML-% IV SOLN: 0.0000 ug/min | INTRAVENOUS | Status: DC

## 2020-01-31 MED ORDER — ACETAMINOPHEN 500 MG PO TABS
1000.0000 mg | ORAL_TABLET | Freq: Once | ORAL | Status: AC
  Administered 2020-01-31: 1000 mg via ORAL
  Filled 2020-01-31: qty 2

## 2020-01-31 MED ORDER — LIDOCAINE HCL (PF) 1 % IJ SOLN
INTRAMUSCULAR | Status: AC
  Filled 2020-01-31: qty 30

## 2020-01-31 MED ORDER — VANCOMYCIN HCL IN DEXTROSE 1-5 GM/200ML-% IV SOLN
1000.0000 mg | Freq: Once | INTRAVENOUS | Status: AC
  Administered 2020-01-31: 1000 mg via INTRAVENOUS
  Filled 2020-01-31: qty 200

## 2020-01-31 MED ORDER — NOREPINEPHRINE 4 MG/250ML-% IV SOLN
INTRAVENOUS | Status: DC | PRN
Start: 2020-01-31 — End: 2020-01-31
  Administered 2020-01-31: 4 ug/min via INTRAVENOUS

## 2020-01-31 MED ORDER — PHENYLEPHRINE HCL-NACL 20-0.9 MG/250ML-% IV SOLN: 0.0000 ug/min | INTRAVENOUS | Status: DC

## 2020-01-31 MED ORDER — ACETAMINOPHEN 650 MG RE SUPP: 650.0000 mg | Freq: Four times a day (QID) | RECTAL | Status: DC | PRN

## 2020-01-31 SURGICAL SUPPLY — 86 items
ADH SKN CLS APL DERMABOND .7 (GAUZE/BANDAGES/DRESSINGS) ×2
APL PRP STRL LF DISP 70% ISPRP (MISCELLANEOUS) ×1
BAG DECANTER FOR FLEXI CONT (MISCELLANEOUS) ×3 IMPLANT
BAG SNAP BAND KOVER 36X36 (MISCELLANEOUS) ×3 IMPLANT
BLADE CLIPPER SURG (BLADE) IMPLANT
BLADE OSCILLATING /SAGITTAL (BLADE) IMPLANT
BLADE STERNUM SYSTEM 6 (BLADE) IMPLANT
BLADE SURG 10 STRL SS (BLADE) IMPLANT
CABLE ADAPT CONN TEMP 6FT (ADAPTER) ×3 IMPLANT
CATH DIAG EXPO 6F AL1 (CATHETERS) IMPLANT
CATH DIAG EXPO 6F VENT PIG 145 (CATHETERS) ×6 IMPLANT
CATH EXTERNAL FEMALE PUREWICK (CATHETERS) IMPLANT
CATH INFINITI 6F AL2 (CATHETERS) IMPLANT
CATH S G BIP PACING (CATHETERS) ×3 IMPLANT
CHLORAPREP W/TINT 26 (MISCELLANEOUS) ×3 IMPLANT
CLIP VESOCCLUDE MED 24/CT (CLIP) IMPLANT
CLIP VESOCCLUDE SM WIDE 24/CT (CLIP) IMPLANT
CLOSURE MYNX CONTROL 6F/7F (Vascular Products) ×3 IMPLANT
CNTNR URN SCR LID CUP LEK RST (MISCELLANEOUS) ×2 IMPLANT
CONT SPEC 4OZ STRL OR WHT (MISCELLANEOUS) ×6
COVER BACK TABLE 80X110 HD (DRAPES) ×3 IMPLANT
COVER WAND RF STERILE (DRAPES) ×3 IMPLANT
DECANTER SPIKE VIAL GLASS SM (MISCELLANEOUS) ×3 IMPLANT
DERMABOND ADVANCED (GAUZE/BANDAGES/DRESSINGS) ×4
DERMABOND ADVANCED .7 DNX12 (GAUZE/BANDAGES/DRESSINGS) ×2 IMPLANT
DEVICE CLOSURE PERCLS PRGLD 6F (VASCULAR PRODUCTS) ×2 IMPLANT
DRAPE INCISE IOBAN 66X45 STRL (DRAPES) IMPLANT
DRSG TEGADERM 4X4.75 (GAUZE/BANDAGES/DRESSINGS) ×6 IMPLANT
ELECT CAUTERY BLADE 6.4 (BLADE) IMPLANT
ELECT REM PT RETURN 9FT ADLT (ELECTROSURGICAL) ×6
ELECTRODE REM PT RTRN 9FT ADLT (ELECTROSURGICAL) ×2 IMPLANT
FELT TEFLON 6X6 (MISCELLANEOUS) IMPLANT
GAUZE 4X4 16PLY RFD (DISPOSABLE) ×3 IMPLANT
GAUZE SPONGE 2X2 8PLY STRL LF (GAUZE/BANDAGES/DRESSINGS) ×2 IMPLANT
GAUZE SPONGE 4X4 12PLY STRL (GAUZE/BANDAGES/DRESSINGS) ×3 IMPLANT
GLOVE BIO SURGEON STRL SZ7.5 (GLOVE) ×3 IMPLANT
GLOVE BIO SURGEON STRL SZ8 (GLOVE) IMPLANT
GLOVE EUDERMIC 7 POWDERFREE (GLOVE) IMPLANT
GLOVE ORTHO TXT STRL SZ7.5 (GLOVE) IMPLANT
GOWN STRL REUS W/ TWL LRG LVL3 (GOWN DISPOSABLE) IMPLANT
GOWN STRL REUS W/ TWL XL LVL3 (GOWN DISPOSABLE) ×1 IMPLANT
GOWN STRL REUS W/TWL LRG LVL3 (GOWN DISPOSABLE)
GOWN STRL REUS W/TWL XL LVL3 (GOWN DISPOSABLE) ×3
GUIDEWIRE SAFE TJ AMPLATZ EXST (WIRE) ×3 IMPLANT
INSERT FOGARTY SM (MISCELLANEOUS) IMPLANT
KIT BASIN OR (CUSTOM PROCEDURE TRAY) ×3 IMPLANT
KIT HEART LEFT (KITS) ×3 IMPLANT
KIT SUCTION CATH 14FR (SUCTIONS) IMPLANT
KIT TURNOVER KIT B (KITS) ×3 IMPLANT
LOOP VESSEL MAXI BLUE (MISCELLANEOUS) IMPLANT
LOOP VESSEL MINI RED (MISCELLANEOUS) IMPLANT
NS IRRIG 1000ML POUR BTL (IV SOLUTION) ×3 IMPLANT
PACK ENDO MINOR (CUSTOM PROCEDURE TRAY) ×3 IMPLANT
PAD ARMBOARD 7.5X6 YLW CONV (MISCELLANEOUS) ×6 IMPLANT
PAD ELECT DEFIB RADIOL ZOLL (MISCELLANEOUS) ×3 IMPLANT
PENCIL BUTTON HOLSTER BLD 10FT (ELECTRODE) IMPLANT
PERCLOSE PROGLIDE 6F (VASCULAR PRODUCTS) ×6
POSITIONER HEAD DONUT 9IN (MISCELLANEOUS) ×3 IMPLANT
SET MICROPUNCTURE 5F STIFF (MISCELLANEOUS) ×3 IMPLANT
SHEATH BRITE TIP 7FR 35CM (SHEATH) ×3 IMPLANT
SHEATH PINNACLE 6F 10CM (SHEATH) ×3 IMPLANT
SHEATH PINNACLE 8F 10CM (SHEATH) ×3 IMPLANT
SLEEVE REPOSITIONING LENGTH 30 (MISCELLANEOUS) ×3 IMPLANT
SPONGE GAUZE 2X2 STER 10/PKG (GAUZE/BANDAGES/DRESSINGS) ×4
STOPCOCK MORSE 400PSI 3WAY (MISCELLANEOUS) ×6 IMPLANT
SUT ETHIBOND X763 2 0 SH 1 (SUTURE) IMPLANT
SUT GORETEX CV 4 TH 22 36 (SUTURE) IMPLANT
SUT GORETEX CV4 TH-18 (SUTURE) IMPLANT
SUT MNCRL AB 3-0 PS2 18 (SUTURE) IMPLANT
SUT PROLENE 5 0 C 1 36 (SUTURE) IMPLANT
SUT PROLENE 6 0 C 1 30 (SUTURE) IMPLANT
SUT SILK  1 MH (SUTURE) ×3
SUT SILK 1 MH (SUTURE) ×1 IMPLANT
SUT VIC AB 2-0 CT1 27 (SUTURE)
SUT VIC AB 2-0 CT1 TAPERPNT 27 (SUTURE) IMPLANT
SUT VIC AB 2-0 CTX 36 (SUTURE) IMPLANT
SUT VIC AB 3-0 SH 8-18 (SUTURE) IMPLANT
SYR 50ML LL SCALE MARK (SYRINGE) ×3 IMPLANT
SYR BULB IRRIG 60ML STRL (SYRINGE) IMPLANT
SYR MEDRAD MARK V 150ML (SYRINGE) ×3 IMPLANT
TOWEL GREEN STERILE (TOWEL DISPOSABLE) ×6 IMPLANT
TRANSDUCER W/STOPCOCK (MISCELLANEOUS) ×6 IMPLANT
TRAY FOLEY SLVR 16FR TEMP STAT (SET/KITS/TRAYS/PACK) IMPLANT
VALVE 26 ULTRA SAPIEN KIT (Valve) ×3 IMPLANT
WIRE EMERALD 3MM-J .035X150CM (WIRE) ×3 IMPLANT
WIRE EMERALD 3MM-J .035X260CM (WIRE) ×3 IMPLANT

## 2020-01-31 NOTE — Progress Notes (Signed)
  Brian [name] VALVE TEAM  Patient doing well s/p TAVR. He is hemodynamically stable. Groin sites stable. ECG with sinus brady but no high grade block. Plan to DC arterial line  and transfer to 4E. Plan for early ambulation after bedrest completed and hopeful discharge over the next 24-48 hours.   Angelena Form PA-C  [facility]  Pager [phone]

## 2020-01-31 NOTE — Interval H&P Note (Signed)
History and Physical Interval Note:  01/31/2020 9:43 AM  Brian [name]  has presented today for surgery, with the diagnosis of Severe Aortic Stenosis.  The various methods of treatment have been discussed with the patient and family. After consideration of risks, benefits and other options for treatment, the patient has consented to  Procedure(s): TRANSCATHETER AORTIC VALVE REPLACEMENT, TRANSFEMORAL (N/A) TRANSESOPHAGEAL ECHOCARDIOGRAM (TEE) (N/A) as a surgical intervention.  The patient's history has been reviewed, patient examined, no change in status, stable for surgery.  I have reviewed the patient's chart and labs.  Questions were answered to the patient's satisfaction.     [name]

## 2020-01-31 NOTE — Plan of Care (Signed)
Continue to monitor

## 2020-01-31 NOTE — Anesthesia Procedure Notes (Signed)
Procedure Name: MAC Date/Time: 01/31/2020 10:52 AM Performed by: Amadeo Garnet, CRNA Pre-anesthesia Checklist: Patient identified, Emergency Drugs available, Suction available and Patient being monitored Patient Re-evaluated:Patient Re-evaluated prior to induction Oxygen Delivery Method: Simple face mask Preoxygenation: Pre-oxygenation with 100% oxygen Induction Type: IV induction Placement Confirmation: positive ETCO2 Dental Injury: Teeth and Oropharynx as per pre-operative assessment

## 2020-01-31 NOTE — Transfer of Care (Signed)
Immediate Anesthesia Transfer of Care Note  Patient: Brian [name]  Procedure(s) Performed: TRANSCATHETER AORTIC VALVE REPLACEMENT, TRANSFEMORAL (N/A ) INTRAOPERATIVE TRANSTHORACIC ECHOCARDIOGRAM (N/A )  Patient Location: Cath Lab  Anesthesia Type:MAC  Level of Consciousness: awake, alert  and oriented  Airway & Oxygen Therapy: Patient Spontanous Breathing and Patient connected to nasal cannula oxygen  Post-op Assessment: Report given to RN, Post -op Vital signs reviewed and stable and Patient moving all extremities  Post vital signs: Reviewed and stable  Last Vitals:  Vitals Value Taken Time  BP 100/50 01/31/20 1232  Temp 36.4 C 01/31/20 1232  Pulse 62 01/31/20 1236  Resp 17 01/31/20 1236  SpO2 100 % 01/31/20 1236  Vitals shown include unvalidated device data.  Last Pain:  Vitals:   01/31/20 0913  TempSrc:   PainSc: 0-No pain      Patients Stated Pain Goal: 4 (82/08/13 8871)  Complications: No apparent anesthesia complications

## 2020-01-31 NOTE — Progress Notes (Signed)
Patient arrived to 4E room 21 at this time post TAVR. Telemetry applied and CCMD notified. V/s and assessment complete. CHG bath done. Patient's temp low at this time. Warm blankets applied.  Bilateral groin sites clean dry and intact. Will continue to monitor.

## 2020-01-31 NOTE — Anesthesia Preprocedure Evaluation (Addendum)
Anesthesia Evaluation  Patient identified by MRN, date of birth, ID band Patient awake    Reviewed: Allergy & Precautions, H&P , NPO status , Patient's Chart, lab work & pertinent test results  Airway Mallampati: II  TM Distance: >3 FB Neck ROM: Full    Dental no notable dental hx. (+) Poor Dentition, Dental Advisory Given   Pulmonary neg pulmonary ROS, former smoker,    Pulmonary exam normal breath sounds clear to auscultation       Cardiovascular hypertension, Pt. on medications +CHF  + Valvular Problems/Murmurs AS  Rhythm:Regular Rate:Normal + Systolic murmurs    Neuro/Psych Dementia negative neurological ROS     GI/Hepatic negative GI ROS, Neg liver ROS,   Endo/Other  diabetes, Insulin Dependent  Renal/GU negative Renal ROS  negative genitourinary   Musculoskeletal  (+) Arthritis , Osteoarthritis,    Abdominal   Peds  Hematology  (+) Blood dyscrasia, anemia ,   Anesthesia Other Findings   Reproductive/Obstetrics negative OB ROS                            Anesthesia Physical Anesthesia Plan  ASA: IV  Anesthesia Plan: MAC   Post-op Pain Management:    Induction: Intravenous  PONV Risk Score and Plan: 2 and Propofol infusion and Ondansetron  Airway Management Planned: Simple Face Mask  Additional Equipment: Arterial line  Intra-op Plan:   Post-operative Plan:   Informed Consent: I have reviewed the patients History and Physical, chart, labs and discussed the procedure including the risks, benefits and alternatives for the proposed anesthesia with the patient or authorized representative who has indicated his/her understanding and acceptance.     Dental advisory given  Plan Discussed with: CRNA  Anesthesia Plan Comments:         Anesthesia Quick Evaluation

## 2020-01-31 NOTE — Anesthesia Procedure Notes (Signed)
Arterial Line Insertion Start/End6/03/2020 9:50 AM, 01/31/2020 10:00 AM Performed by: Roderic Palau, MD  Patient location: Pre-op. Preanesthetic checklist: patient identified, IV checked, site marked, risks and benefits discussed, surgical consent, monitors and equipment checked, pre-op evaluation, timeout performed and anesthesia consent Lidocaine 1% used for infiltration Right, brachial was placed Catheter size: 20 Fr Hand hygiene performed , maximum sterile barriers used  and Seldinger technique used  Attempts: 1 Procedure performed using ultrasound guided technique. Ultrasound Notes:anatomy identified, needle tip was noted to be adjacent to the nerve/plexus identified, no ultrasound evidence of intravascular and/or intraneural injection and image(s) printed for medical record Following insertion, dressing applied, Biopatch and line sutured. Post procedure assessment: normal and unchanged  Patient tolerated the procedure well with no immediate complications.

## 2020-01-31 NOTE — Anesthesia Postprocedure Evaluation (Signed)
Anesthesia Post Note  Patient: [name]  Procedure(s) Performed: TRANSCATHETER AORTIC VALVE REPLACEMENT, TRANSFEMORAL (N/A ) INTRAOPERATIVE TRANSTHORACIC ECHOCARDIOGRAM (N/A )     Patient location during evaluation: Cath Lab Anesthesia Type: MAC Level of consciousness: awake and alert Pain management: pain level controlled Vital Signs Assessment: post-procedure vital signs reviewed and stable Respiratory status: spontaneous breathing, nonlabored ventilation and respiratory function stable Cardiovascular status: stable and blood pressure returned to baseline Postop Assessment: no apparent nausea or vomiting Anesthetic complications: no    Last Vitals:  Vitals:   01/31/20 1338 01/31/20 1509  BP: 126/64 (!) 142/78  Pulse:  (!) 49  Resp: 18 17  Temp:    SpO2:  100%    Last Pain:  Vitals:   01/31/20 0913  TempSrc:   PainSc: 0-No pain                 Anoushka Divito,W. EDMOND

## 2020-01-31 NOTE — Discharge Instructions (Signed)
ACTIVITY AND EXERCISE °• Daily activity and exercise are an important part of your recovery. People recover at different rates depending on their general health and type of valve procedure. °• Most people recovering from TAVR feel better relatively quickly  °• No lifting, pushing, pulling more than 10 pounds (examples to avoid: groceries, vacuuming, gardening, golfing): °            - For one week with a procedure through the groin. °            - For six weeks for procedures through the chest wall or neck. °NOTE: You will typically see one of our providers 7-14 days after your procedure to discuss WHEN TO RESUME the above activities.  °  °  °DRIVING °• Do not drive until you are seen for follow up and cleared by a provider. Generally, we ask patient to not drive for 1 week after their procedure. °• If you have been told by your doctor in the past that you may not drive, you must talk with him/her before you begin driving again. °  °DRESSING °• Groin site: you may leave the clear dressing over the site for up to one week or until it falls off. °  °HYGIENE °• If you had a femoral (leg) procedure, you may take a shower when you return home. After the shower, pat the site dry. Do NOT use powder, oils or lotions in your groin area until the site has completely healed. °• If you had a chest procedure, you may shower when you return home unless specifically instructed not to by your discharging practitioner. °            - DO NOT scrub incision; pat dry with a towel. °            - DO NOT apply any lotions, oils, powders to the incision. °            - No tub baths / swimming for at least 2 weeks. °• If you notice any fevers, chills, increased pain, swelling, bleeding or pus, please contact your doctor. °  °ADDITIONAL INFORMATION °• If you are going to have an upcoming dental procedure, please contact our office as you will require antibiotics ahead of time to prevent infection on your heart valve.  ° ° °If you have any  questions or concerns you can call the structural heart phone during normal business hours 8am-4pm. If you have an urgent need after hours or weekends please call [phone] to talk to the on call provider for general cardiology. If you have an emergency that requires immediate attention, please call 911.  ° ° °After TAVR Checklist ° °Check  Test Description  ° Follow up appointment in 1-2 weeks  You will see our structural heart physician assistant, Brian [name]. Your incision sites will be checked and you will be cleared to drive and resume all normal activities if you are doing well.    ° 1 month echo and follow up  You will have an echo to check on your new heart valve and be seen back in the office by Brian [name]. Many times the echo is not read by your appointment time, but Brian will call you later that day or the following day to report your results.  ° Follow up with your primary cardiologist You will need to be seen by your primary cardiologist in the following 3-6 months after your 1 month appointment in the valve   clinic. Often times your Plavix or Aspirin will be discontinued during this time, but this is decided on a case by case basis.   ° 1 year echo and follow up You will have another echo to check on your heart valve after 1 year and be seen back in the office by Brian [name]. This your last structural heart visit.  ° Bacterial endocarditis prophylaxis  You will have to take antibiotics for the rest of your life before all dental procedures (even teeth cleanings) to protect your heart valve. Antibiotics are also required before some surgeries. Please check with your cardiologist before scheduling any surgeries. Also, please make sure to tell us if you have a penicillin allergy as you will require an alternative antibiotic.   ° ° °

## 2020-01-31 NOTE — CV Procedure (Signed)
[facility]  TAVR OPERATIVE NOTE   Date of Procedure:  01/31/2020  Preoperative Diagnosis: Severe Aortic Stenosis   Postoperative Diagnosis: Same   Procedure:    Transcatheter Aortic Valve Replacement - Transfemoral Approach  Edwards Sapien 3 THV (size 26  mm, model #[id number], serial # [id number])   Co-Surgeons:  Lauree Chandler, MD and Valentina Gu. Roxy Manns, MD   Anesthesiologist:  Ola Spurr  Echocardiographer:  Johnsie Cancel  Pre-operative Echo Findings:  Severe aortic stenosis  Normal left ventricular systolic function  Post-operative Echo Findings:  No paravalvular leak  Normal left ventricular systolic function  BRIEF CLINICAL NOTE AND INDICATIONS FOR SURGERY  86 yo male with history of anemia, stage 3 chronic kidney disease, chronic diastolic CHF, diabetes mellitus, HTN, HLD and moderately severe aortic stenosis who is here today for TAVR. He has a history of bradycardia and has been off of beta blockers. He has a history of PVCs. His moderate aortic stenosis has been followed for the past several years. Echo 09/29/19 with LVEF=65-70%, moderate LVH, grade 1 diastolic dysfunction, mild MR. The aortic valve leaflets are thickened and calcified with limited leaflet mobility. Mean gradient 37 mmHg, peak gradient 58 mmHg, dimensionless index 0.26, AVA 1.22 cm2. This is consistent with moderate to severe aortic stenosis. Since he was asymptomatic and his aortic stenosis was felt to be moderately severe, we delayed any further workup. Now with progression of symptoms.   During the course of the patient's preoperative work up they have been evaluated comprehensively by a multidisciplinary team of specialists coordinated through the [facility] in the [facility].  They have been demonstrated to suffer from symptomatic severe aortic stenosis as noted above. The patient has been counseled extensively as to the relative risks and  benefits of all options for the treatment of severe aortic stenosis including long term medical therapy, conventional surgery for aortic valve replacement, and transcatheter aortic valve replacement.  The patient has been independently evaluated by Dr. Roxy Manns with CT surgery and they are felt to be at high risk for conventional surgical aortic valve replacement. The surgeon indicated the patient would be a poor candidate for conventional surgery. Based upon review of all of the patient's preoperative diagnostic tests they are felt to be candidate for transcatheter aortic valve replacement using the transfemoral approach as an alternative to high risk conventional surgery.    Following the decision to proceed with transcatheter aortic valve replacement, a discussion has been held regarding what types of management strategies would be attempted intraoperatively in the event of life-threatening complications, including whether or not the patient would be considered a candidate for the use of cardiopulmonary bypass and/or conversion to open sternotomy for attempted surgical intervention.  The patient has been advised of a variety of complications that might develop peculiar to this approach including but not limited to risks of death, stroke, paravalvular leak, aortic dissection or other major vascular complications, aortic annulus rupture, device embolization, cardiac rupture or perforation, acute myocardial infarction, arrhythmia, heart block or bradycardia requiring permanent pacemaker placement, congestive heart failure, respiratory failure, renal failure, pneumonia, infection, other late complications related to structural valve deterioration or migration, or other complications that might ultimately cause a temporary or permanent loss of functional independence or other long term morbidity.  The patient provides full informed consent for the procedure as described and all questions were answered  preoperatively.    DETAILS OF THE OPERATIVE PROCEDURE  PREPARATION:   The patient is  brought to the operating room on the above mentioned date and central monitoring was established by the anesthesia team including placement of a radial arterial line. The patient is placed in the supine position on the operating table.  Intravenous antibiotics are administered. Conscious sedation is used.   Baseline transthoracic echocardiogram was performed. The patient's chest, abdomen, both groins, and both lower extremities are prepared and draped in a sterile manner. A time out procedure is performed.   PERIPHERAL ACCESS:   Using the modified Seldinger technique, femoral arterial and venous access were obtained with placement of 6 Fr sheaths on the left side.  A pigtail diagnostic catheter was passed through the femoral arterial sheath under fluoroscopic guidance into the aortic root.  A temporary transvenous pacemaker catheter was passed through the femoral venous sheath under fluoroscopic guidance into the right ventricle.  The pacemaker was tested to ensure stable lead placement and pacemaker capture. Aortic root angiography was performed in order to determine the optimal angiographic angle for valve deployment.  TRANSFEMORAL ACCESS:  A micropuncture kit was used to gain access to the femoral artery. Position confirmed with angiography. Pre-closure with double ProGlide closure devices. The patient was heparinized systemically and ACT verified > 250 seconds.    A 14 Fr transfemoral E-sheath was introduced into the right femoral artery after progressively dilating over an Amplatz superstiff wire. An AL-2 catheter was used to direct a straight-tip exchange length wire across the native aortic valve into the left ventricle. This was exchanged out for a pigtail catheter and position was confirmed in the LV apex. Simultaneous LV and Ao pressures were recorded.  The pigtail catheter was then exchanged for an  Amplatz Extra-stiff wire in the LV apex.   TRANSCATHETER HEART VALVE DEPLOYMENT:  An Edwards Sapien 3 THV (size 26 mm) was prepared and crimped per manufacturer's guidelines, and the proper orientation of the valve is confirmed on the [employer] delivery system. The valve was advanced through the introducer sheath using normal technique until in an appropriate position in the abdominal aorta beyond the sheath tip. The balloon was then retracted and using the fine-tuning wheel was centered on the valve. The valve was then advanced across the aortic arch using appropriate flexion of the catheter. The valve was carefully positioned across the aortic valve annulus. The Commander catheter was retracted using normal technique. Once final position of the valve has been confirmed by angiographic assessment, the valve is deployed while temporarily holding ventilation and during rapid ventricular pacing to maintain systolic blood pressure < 50 mmHg and pulse pressure < 10 mmHg. The balloon inflation is held for >3 seconds after reaching full deployment volume. Once the balloon has fully deflated the balloon is retracted into the ascending aorta and valve function is assessed using TTE. There is felt to be no paravalvular leak and no central aortic insufficiency.  The patient's hemodynamic recovery following valve deployment is good.  The deployment balloon and guidewire are both removed. Echo demostrated acceptable post-procedural gradients, stable mitral valve function, and no AI.   PROCEDURE COMPLETION:  The sheath was then removed and closure devices were completed. Protamine was administered once femoral arterial repair was complete. The temporary pacemaker, pigtail catheters and femoral sheaths were removed with Mynx closure device placed in the artery. Manual pressure for venous hemostasis.   The patient tolerated the procedure well and is transported to the surgical intensive care in stable condition.  There were no immediate intraoperative complications. All sponge instrument and needle counts  are verified correct at completion of the operation.   No blood products were administered during the operation.  The patient received a total of 40 mL of intravenous contrast during the procedure.  Lauree Chandler MD 01/31/2020 12:02 PM

## 2020-01-31 NOTE — Op Note (Signed)
[facility]   MULTIDISCIPLINARY HEART VALVE TEAM   TAVR OPERATIVE NOTE   Date of Procedure:  01/31/2020  Preoperative Diagnosis: Severe Aortic Stenosis   Postoperative Diagnosis: Same   Procedure:    Transcatheter Aortic Valve Replacement - Percutaneous Right Transfemoral Approach  Edwards Sapien 3 Ultra THV (size 26 mm, model # [id number], serial # [id number])   Co-Surgeons:  Valentina Gu. Roxy Manns, MD and Lauree Chandler, MD  Anesthesiologist:  Arabella Merles, MD  Echocardiographer:  Jenkins Rouge, MD  Pre-operative Echo Findings:  Severe aortic stenosis  Normal left ventricular systolic function  Post-operative Echo Findings:  No paravalvular leak  Normal left ventricular systolic function   BRIEF CLINICAL NOTE AND INDICATIONS FOR SURGERY  Patient is an 86 year old African-American male with history of aortic stenosis, hypertension, chronic diastolic congestive heart failure, hyperlipidemia, stage III chronic kidney disease, and insulin-dependent type 2 diabetes mellitus who has been referred for surgical consultation to discuss treatment options for management of severe symptomatic aortic stenosis.  Patient has been followed for several years by Dr. Oval Linsey with known history of aortic stenosis and hypertension.  Previous echocardiograms have documented the presence of normal left ventricular systolic function with aortic stenosis that has slowly progressed in severity.  Over the past year the patient has developed increased tendency to get fatigued with decreased exercise tolerance and weight loss.  Recent follow-up echocardiogram revealed significant progression of aortic stenosis with peak velocity across aortic valve measured greater than 3.3 m/s corresponding to mean transvalvular gradient estimated 37 mmHg.  DVI was reported 0.32.  Left ventricular systolic function remain normal.  The patient was referred to the multidisciplinary heart valve clinic  and has been evaluated previously by Dr. Angelena Form.  Catheterization revealed mild to moderate nonobstructive coronary artery disease.  Mean transvalvular gradient across aortic valve was reported 25 mmHg.  Right heart pressures were normal.  CT angiography was performed and the patient was referred for surgical consultation.  During the course of the patient's preoperative work up they have been evaluated comprehensively by a multidisciplinary team of specialists coordinated through the [facility] in the [facility].  They have been demonstrated to suffer from symptomatic severe aortic stenosis as noted above. The patient has been counseled extensively as to the relative risks and benefits of all options for the treatment of severe aortic stenosis including long term medical therapy, conventional surgery for aortic valve replacement, and transcatheter aortic valve replacement.  All questions have been answered, and the patient provides full informed consent for the operation as described.   DETAILS OF THE OPERATIVE PROCEDURE  PREPARATION:    The patient is brought to the operating room on the above mentioned date and appropriate monitoring was established by the anesthesia team. The patient is placed in the supine position on the operating table.  Intravenous antibiotics are administered. The patient is monitored closely throughout the procedure under conscious sedation.  Baseline transthoracic echocardiogram was performed. The patient's chest, abdomen, both groins, and both lower extremities are prepared and draped in a sterile manner. A time out procedure is performed.   PERIPHERAL ACCESS:    Using the modified Seldinger technique, femoral arterial and venous access was obtained with placement of 6 Fr sheaths on the left side.  A pigtail diagnostic catheter was passed through the left arterial sheath under fluoroscopic guidance into the aortic  root.  A temporary transvenous pacemaker catheter was passed through the left femoral venous sheath under  fluoroscopic guidance into the right ventricle.  The pacemaker was tested to ensure stable lead placement and pacemaker capture. Aortic root angiography was performed in order to determine the optimal angiographic angle for valve deployment.   TRANSFEMORAL ACCESS:   Percutaneous transfemoral access and sheath placement was performed using ultrasound guidance.  The right common femoral artery was cannulated using a micropuncture needle and appropriate location was verified using hand injection angiogram.  A pair of Abbott Perclose percutaneous closure devices were placed and a 6 French sheath replaced into the femoral artery.  The patient was heparinized systemically and ACT verified > 250 seconds.    A 14 Fr transfemoral E-sheath was introduced into the right common femoral artery after progressively dilating over an Amplatz superstiff wire. An AL-1 catheter was used to direct a straight-tip exchange length wire across the native aortic valve into the left ventricle. This was exchanged out for a pigtail catheter and position was confirmed in the LV apex. Simultaneous LV and Ao pressures were recorded.  The pigtail catheter was exchanged for an Amplatz Extra-stiff wire in the LV apex.  Echocardiography was utilized to confirm appropriate wire position and no sign of entanglement in the mitral subvalvular apparatus.   TRANSCATHETER HEART VALVE DEPLOYMENT:   An Edwards Sapien 3 Ultra transcatheter heart valve (size 26 mm, model #9750TFX, serial #[id number]) was prepared and crimped per manufacturer's guidelines, and the proper orientation of the valve is confirmed on the [employer] delivery system. The valve was advanced through the introducer sheath using normal technique until in an appropriate position in the abdominal aorta beyond the sheath tip. The balloon was then retracted and using the  fine-tuning wheel was centered on the valve. The valve was then advanced across the aortic arch using appropriate flexion of the catheter. The valve was carefully positioned across the aortic valve annulus. The Commander catheter was retracted using normal technique. Once final position of the valve has been confirmed by angiographic assessment, the valve is deployed while temporarily holding ventilation and during rapid ventricular pacing to maintain systolic blood pressure < 50 mmHg and pulse pressure < 10 mmHg. The balloon inflation is held for >3 seconds after reaching full deployment volume. Once the balloon has fully deflated the balloon is retracted into the ascending aorta and valve function is assessed using echocardiography. There is felt to be no paravalvular leak and no central aortic insufficiency.  The patient's hemodynamic recovery following valve deployment is good.  The deployment balloon and guidewire are both removed.    PROCEDURE COMPLETION:   The sheath was removed and femoral artery closure performed.  Protamine was administered once femoral arterial repair was complete. The temporary pacemaker, pigtail catheters and femoral sheaths were removed with manual pressure used for hemostasis.  A Mynx femoral closure device was utilized following removal of the diagnostic sheath in the left femoral artery.  The patient tolerated the procedure well and is transported to the surgical intensive care in stable condition. There were no immediate intraoperative complications. All sponge instrument and needle counts are verified correct at completion of the operation.   No blood products were administered during the operation.  The patient received a total of 40 mL of intravenous contrast during the procedure.   [name], MD 01/31/2020 12:20 PM

## 2020-01-31 NOTE — Progress Notes (Signed)
   01/31/20 0918  Complete After Swab Resulted:  Pre-op Surgical PCR Treatment (All Areas)  Mupirocin Treatment Completed? No  Reason Mupirocin Not Used: Unable to complete due to time frame (First aministered 01/27/20 PM. Last taken 01/30/20 PM)  Patient positive for Staph, but allergic to shellfish. Has not completed Mupirocin abx ointment tx. Mupirocin Ointment ordered. 3 doses left to complete tx.

## 2020-02-01 ENCOUNTER — Inpatient Hospital Stay (HOSPITAL_COMMUNITY): Payer: Medicare PPO

## 2020-02-01 DIAGNOSIS — Z952 Presence of prosthetic heart valve: Secondary | ICD-10-CM

## 2020-02-01 LAB — BASIC METABOLIC PANEL
Anion gap: 10 (ref 5–15)
BUN: 11 mg/dL (ref 8–23)
CO2: 19 mmol/L — ABNORMAL LOW (ref 22–32)
Calcium: 9 mg/dL (ref 8.9–10.3)
Chloride: 106 mmol/L (ref 98–111)
Creatinine, Ser: 1.05 mg/dL (ref 0.61–1.24)
GFR calc Af Amer: >60 mL/min (ref >60)
GFR calc non Af Amer: >60 mL/min (ref >60)
Glucose, Bld: 206 mg/dL — ABNORMAL HIGH (ref 70–99)
Potassium: 3.5 mmol/L (ref 3.5–5.1)
Sodium: 135 mmol/L (ref 135–145)

## 2020-02-01 LAB — CBC
HCT: 32.4 % — ABNORMAL LOW (ref 39.0–52.0)
Hemoglobin: 10.5 g/dL — ABNORMAL LOW (ref 13.0–17.0)
MCH: 28.1 pg (ref 26.0–34.0)
MCHC: 32.4 g/dL (ref 30.0–36.0)
MCV: 86.6 fL (ref 80.0–100.0)
Platelets: 234 10*3/uL (ref 150–400)
RBC: 3.74 MIL/uL — ABNORMAL LOW (ref 4.22–5.81)
RDW: 14.5 % (ref 11.5–15.5)
WBC: 6.8 10*3/uL (ref 4.0–10.5)
nRBC: 0 % (ref 0.0–0.2)

## 2020-02-01 LAB — GLUCOSE, CAPILLARY
Glucose-Capillary: 201 mg/dL — ABNORMAL HIGH (ref 70–99)
Glucose-Capillary: 204 mg/dL — ABNORMAL HIGH (ref 70–99)
Glucose-Capillary: 190 mg/dL — ABNORMAL HIGH (ref 70–99)

## 2020-02-01 LAB — MAGNESIUM: Magnesium: 1.5 mg/dL — ABNORMAL LOW (ref 1.7–2.4)

## 2020-02-01 NOTE — Progress Notes (Signed)
  Echocardiogram 2D Echocardiogram has been performed.  Jennette Dubin 02/01/2020, 1:58 PM

## 2020-02-01 NOTE — Progress Notes (Signed)
Unable to get standing weight due to episode

## 2020-02-01 NOTE — Progress Notes (Signed)
Patient was placed in wheelchair and moved to the front desk so that staff could keep an eye on him.   Approximately 0400, patient stated that, "he was tired and wanted to go back to bed. Patient was wheeled back to the room. This RN and another RN was going to get a standing weight while patient was awake.   Scale was placed in front of patient. Told patient to stand and step on the scale. Patient started jerking and grabbing aimlessly. Patient's head dropped and would not respond to name. patient's eyes rolled back and patient took a gasp. Sternal rub done but no response. Patient picked up and placed on bed. Patient had a pulse and breathing. CBG 201. Patient diaphoretic. B/P taken, see doc flow sheets.   Patient came around and could answer his name. No neuro deficits noted. Patient's confusion same as prior to episode. Patient resting as this time.  Labs lactic,PT, ammonia, CBC, BMP with mag drawn.   Notified on call for cardiology. Advised MD of episode. Continue to monitor patient for any neuro changes. Awaiting lab results. Will continue to monitor

## 2020-02-01 NOTE — Discharge Summary (Signed)
[place] VALVE TEAM  Discharge Summary    Patient ID: Brian [name] MRN: [id number]; DOB: Apr 07, 1934  Admit date: 01/31/2020 Discharge date: 02/01/2020  Primary Care Provider: Delsa Grana, PA-C  Primary Cardiologist: Dr. Hubbard Hartshorn / Dr. Buena Irish & Dr. Roxy Manns (TAVR)  Discharge Diagnoses    Principal Problem:   S/P TAVR (transcatheter aortic valve replacement) Active Problems:   Essential hypertension   Hyperlipidemia LDL goal <100   Chronic kidney disease (CKD), stage III (moderate)   Benign localized prostatic hyperplasia with lower urinary tract symptoms (LUTS)   Diastolic dysfunction without heart failure   Anemia   Type 2 diabetes mellitus with stage 3 chronic kidney disease, with long-term current use of insulin (HCC)   Severe aortic stenosis   Allergies Allergies  Allergen Reactions  . Carvedilol Other (See Comments)    bradycardia  . Niaspan [Niacin Er]   . Shellfish Allergy Other (See Comments)    Pt vomits  . Statins Other (See Comments)    Myalgia  . Sulfa Antibiotics   . Zetia [Ezetimibe]   . Januvia [Sitagliptin] Rash    Diagnostic Studies/Procedures    TAVR OPERATIVE NOTE   Date of Procedure:                01/31/2020  Preoperative Diagnosis:      Severe Aortic Stenosis   Postoperative Diagnosis:    Same   Procedure:        Transcatheter Aortic Valve Replacement - Percutaneous Right Transfemoral Approach             Edwards Sapien 3 Ultra THV (size 26 mm, model # 9750TFX, serial # [id number])              Co-Surgeons:                        Valentina Gu. Roxy Manns, MD and Lauree Chandler, MD  Anesthesiologist:                  Arabella Merles, MD  Echocardiographer:              Jenkins Rouge, MD  Pre-operative Echo Findings: ? Severe aortic stenosis ? Normal left ventricular systolic function  Post-operative Echo Findings: ? No paravalvular leak ? Normal left ventricular systolic  function  _____________   Echo 01/31/20: complete but pending formal read at the time of discharge   History of Present Illness   Brian [name] is a 86 y.o. male with a history of HTN, chronic diastolic CHF, HLD, stage III CKD, IDDM, chronic anemia, dementia and severe AS who presented to [facility] on 01/31/20 for planned TAVR.   Patient has been followed for several years by Dr. Oval Linsey with known history of aortic stenosis and hypertension.  Previous echocardiograms have documented the presence of normal left ventricular systolic function with aortic stenosis that has slowly progressed in severity.  Over the past year the patient has developed increased tendency to get fatigued with decreased exercise tolerance and weight loss.  Recent follow-up echocardiogram revealed significant progression of aortic stenosis with peak velocity across aortic valve measured greater than 3.3 m/s corresponding to mean transvalvular gradient estimated 37 mmHg.  DVI was reported 0.32.  Left ventricular systolic function remain normal. Cardiac catheterization revealed mild to moderate nonobstructive coronary artery disease.  Mean transvalvular gradient across aortic valve was reported 25 mmHg.  Right heart pressures were normal.  The patient has  been evaluated by the multidisciplinary valve team and felt to have severe, symptomatic aortic stenosis and to be a suitable candidate for TAVR, which was set up for 01/31/20.   Hospital Course     Consultants: none   Severe AS: s/p successful TAVR with a 26 mm Edwards Sapien 3 Ultra THV via the TF approach on 01/31/20. Post operative echo completed but pending formal read. Groin sites are stable. ECG with sinus and no high grade heart block. Will continue him on aspirin alone given elderly age, frailty and gait instability with high fall risk. Plan for DC home today with close follow up in the office next week.  DMT2: treated with SSI. Resume home medications at discharge  HTN:  resume home BP meds  Gait imbalance: will order home walker  Delirium with underlying dementia: patient having worsening confusion while admitted. Hopefully, this will improve once he is home in his normal surroundings.   Unresponsive episode: per nursing report, pt had an episode of unresponsiveness around 4am. Pt was diaphoretic and eyes "rolled into the back of his head." Telemetry showed NSR. CBG 201. BP 140/77. ? Vagal episode. Follow up lab work was stable. Mildly elevated temp ~100 deg F. HR slightly elevated in low 100s. White count normal. CXR stable.   _____________  Discharge Vitals Blood pressure 119/69, pulse 99, temperature 98 F (36.7 C), temperature source Oral, resp. rate 15, height 5\' 11"  (1.803 m), weight 62.1 kg, SpO2 100 %.  Filed Weights   01/31/20 0834 01/31/20 2300  Weight: 64.1 kg 62.1 kg    Labs & Radiologic Studies    CBC Recent Labs    01/31/20 1309 02/01/20 0412  WBC  --  6.8  HGB 8.8* 10.5*  HCT 26.0* 32.4*  MCV  --  86.6  PLT  --  163   Basic Metabolic Panel Recent Labs    01/31/20 1309 02/01/20 0412  NA 140 135  K 4.2 3.5  CL 105 106  CO2  --  19*  GLUCOSE 193* 206*  BUN 14 11  CREATININE 0.90 1.05  CALCIUM  --  9.0  MG  --  1.5*   Liver Function Tests No results for input(s): AST, ALT, ALKPHOS, BILITOT, PROT, ALBUMIN in the last 72 hours. No results for input(s): LIPASE, AMYLASE in the last 72 hours. Cardiac Enzymes No results for input(s): CKTOTAL, CKMB, CKMBINDEX, TROPONINI in the last 72 hours. BNP Invalid input(s): POCBNP D-Dimer No results for input(s): DDIMER in the last 72 hours. Hemoglobin A1C No results for input(s): HGBA1C in the last 72 hours. Fasting Lipid Panel No results for input(s): CHOL, HDL, LDLCALC, TRIG, CHOLHDL, LDLDIRECT in the last 72 hours. Thyroid Function Tests No results for input(s): TSH, T4TOTAL, T3FREE, THYROIDAB in the last 72 hours.  Invalid input(s): FREET3 _____________  DG Chest 2  View  Result Date: 01/27/2020 CLINICAL DATA:  Preop TAVR.  Aortic stenosis. EXAM: CHEST - 2 VIEW COMPARISON:  08/03/2015 FINDINGS: COPD with hyperinflation lungs. Blunting of the right costophrenic angle compatible with scarring or small effusion. Lungs are now clear without infiltrate effusion or mass. Negative for heart failure. Atherosclerotic calcification aortic arch. IMPRESSION: COPD. Negative for heart failure or pneumonia. Small right pleural effusion versus pleural scarring. Electronically Signed   By: Franchot Gallo M.D.   On: 01/27/2020 16:51   CARDIAC CATHETERIZATION  Result Date: 01/12/2020  Prox RCA lesion is 20% stenosed.  Mid RCA to Dist RCA lesion is 40% stenosed.  Mid LAD lesion  is 50% stenosed.  2nd Diag lesion is 60% stenosed.  1st Diag lesion is 80% stenosed.  LV end diastolic pressure is normal.  `1. Mild to moderate non-obstructive disease in the LAD. Moderate disease in the small caliber first and second diagonal branches 2. Mild non-obstructive disease in the RCA and Circumflex 3. Severe aortic stenosis by echo (cath with mean gradient 25 mmHg) Recommendations: Will continue workup for TAVR  CT CORONARY MORPH W/CTA COR W/SCORE W/CA W/CM &/OR WO/CM  Addendum Date: 01/18/2020   ADDENDUM REPORT: 01/18/2020 20:35 CLINICAL DATA:  Severe Aortic Stenosis. EXAM: Cardiac TAVR CT TECHNIQUE: The patient was scanned on a [employer]. A 120 kV retrospective scan was triggered in the descending thoracic aorta at 111 HU's. Gantry rotation speed was 250 msecs and collimation was .6 mm. No beta blockade or nitro were given. The 3D data set was reconstructed in 5% intervals of the R-R cycle. Systolic and diastolic phases were analyzed on a dedicated work station using MPR, MIP and VRT modes. The patient received 80 cc of contrast. FINDINGS: Image quality: Excellent. Noise artifact is: Limited. Valve Morphology: The aortic valve is tricuspid. The aortic valve is severely calcified  with bulky calcification noted on the [place]. There is restricted leaflet motion of all leaflets in systole consistent with severe aortic stenosis. Aortic Valve Calcium score: 1751 Aortic annular dimension: Phase assessed: 25% Annular area: 504 mm2 Annular perimeter: 80.8 mm Max diameter: 28.4 mm Min diameter: 23.3 mm Annular and subannular calcification: No annular calcification. No subannular calcification. Optimal coplanar projection: LAO 6 CAU 20 Coronary Artery Height above Annulus: Left Main: 9.0 mm Right Coronary: 19.9 mm Sinus of Valsalva Measurements: Non-coronary: 36 mm Right-coronary: 37 mm Left-coronary: 38 mm Sinus of Valsalva Height: Non-coronary: 23.3 mm Right-coronary: 24.5 mm Left-coronary: 22.7 mm Sinotubular Junction: 32 mm Ascending Thoracic Aorta: 35 mm Coronary Arteries: Normal coronary origin. Right dominance. The study was performed without use of NTG and is insufficient for plaque evaluation. Please refer to recent cardiac cath for coronary assessment. Cardiac Morphology: Right Atrium: Right atrial size is within normal limits. Right Ventricle: The right ventricular cavity is within normal limits. Left Atrium: Left atrial size is normal in size with no left atrial appendage filling defect. Left Ventricle: The ventricular cavity size is within normal limits. There are no stigmata of prior infarction. There is no abnormal filling defect. LVEF=62%. No regional wall motion abnormalities noted. Pulmonary arteries: Normal in size without proximal filling defect. Pulmonary veins: Normal pulmonary venous drainage. Pericardium: Normal thickness with no significant effusion or calcium present. Mitral Valve: The mitral valve is normal structure without significant calcification. Extra-cardiac findings: See attached radiology report for non-cardiac structures. IMPRESSION: 1. Annular measurements appropriate for 26 mm Sapien 3 TAVR. 2. Suboptimal coronary height for the left coronary artery at 9.0 mm, but  large sinus of valsalva measurements noted (36-38 mm). 3. No significant annular or subannular calcification. 4. Optimal Fluoroscopic Angle for Delivery: LAO 6 CAU 16 [place] T. Audie Box, MD Electronically Signed   By: Eleonore Chiquito   On: 01/18/2020 20:35   Result Date: 01/18/2020 EXAM: OVER-READ INTERPRETATION  CT CHEST The following report is an over-read performed by radiologist Dr. Vinnie Langton of [facility], [place] on 01/18/2020. This over-read does not include interpretation of cardiac or coronary anatomy or pathology. The coronary calcium score/coronary CTA interpretation by the cardiologist is attached. COMPARISON:  None. FINDINGS: Extracardiac findings will be described separately under dictation for contemporaneously obtained CTA chest, abdomen and  pelvis. IMPRESSION: Please see separate dictation for contemporaneously obtained CTA chest, abdomen and pelvis dated 01/18/2020 for full description of relevant extracardiac findings. Electronically Signed: By: Vinnie Langton M.D. On: 01/18/2020 11:08   DG Chest Port 1 View  Result Date: 01/31/2020 CLINICAL DATA:  Status post TAVR today. EXAM: PORTABLE CHEST 1 VIEW COMPARISON:  Preoperative radiograph 01/27/2020 FINDINGS: Interval TAVR. Heart is normal in size with normal mediastinal contours. Aortic atherosclerosis. No pneumothorax. No pulmonary edema. Focal airspace disease or pleural effusion. No acute osseous abnormalities are seen. IMPRESSION: Status post TAVR. No acute findings. Electronically Signed   By: Keith Rake M.D.   On: 01/31/2020 16:46   CT ANGIO CHEST AORTA W/CM & OR WO/CM  Result Date: 01/18/2020 CLINICAL DATA:  86 year old male with history of aortic stenosis. Preprocedural study prior to potential transcatheter aortic valve replacement (TAVR) procedure. EXAM: CT ANGIOGRAPHY CHEST, ABDOMEN AND PELVIS TECHNIQUE: Non-contrast CT of the chest was initially obtained. Multidetector CT imaging through the chest, abdomen and  pelvis was performed using the standard protocol during bolus administration of intravenous contrast. Multiplanar reconstructed images and MIPs were obtained and reviewed to evaluate the vascular anatomy. CONTRAST:  160mL OMNIPAQUE IOHEXOL 350 MG/ML SOLN COMPARISON:  CT the abdomen and pelvis 11/28/2013. FINDINGS: CTA CHEST FINDINGS Cardiovascular: Heart size is normal. There is no significant pericardial fluid, thickening or pericardial calcification. There is aortic atherosclerosis, as well as atherosclerosis of the great vessels of the mediastinum and the coronary arteries, including calcified atherosclerotic plaque in the left main, left anterior descending, left circumflex and right coronary arteries. Severe thickening calcification of the aortic valve. Mediastinum/Lymph Nodes: No pathologically enlarged mediastinal or hilar lymph nodes. Esophagus is unremarkable in appearance. No axillary lymphadenopathy. Lungs/Pleura: No suspicious appearing pulmonary nodules or masses are noted. No acute consolidative airspace disease. No pleural effusions. Musculoskeletal/Soft Tissues: There are no aggressive appearing lytic or blastic lesions noted in the visualized portions of the skeleton. CTA ABDOMEN AND PELVIS FINDINGS Hepatobiliary: No suspicious cystic or solid hepatic lesions. No intra or extrahepatic biliary ductal dilatation. Gallbladder is normal in appearance. Pancreas: No pancreatic mass. No pancreatic ductal dilatation. No pancreatic or peripancreatic fluid collections or inflammatory changes. Spleen: Unremarkable. Adrenals/Urinary Tract: Multiple low-attenuation lesions in the kidneys bilaterally, compatible with simple cysts, measuring up to 2.2 cm in the upper pole the left kidney. Mild thickening of the left adrenal gland suggestive of hyperplasia. Right adrenal gland is normal in appearance. No hydroureteronephrosis. Urinary bladder is normal in appearance. Stomach/Bowel: Normal appearance of the stomach.  No pathologic dilatation of small bowel or colon. Numerous colonic diverticulae are noted, particularly in the descending colon and sigmoid colon, without surrounding inflammatory changes to suggest an acute diverticulitis at this time. The appendix is not confidently identified and may be surgically absent. Regardless, there are no inflammatory changes noted adjacent to the cecum to suggest the presence of an acute appendicitis at this time. Vascular/Lymphatic: Aortic atherosclerosis, without evidence of aneurysm or dissection in the abdominal or pelvic vasculature. Vascular findings and measurements pertinent to potential TAVR procedure, as detailed below. No lymphadenopathy noted in the abdomen or pelvis. Reproductive: Prostate gland and seminal vesicles are unremarkable in appearance. Other: No significant volume of ascites.  No pneumoperitoneum. Musculoskeletal: There are no aggressive appearing lytic or blastic lesions noted in the visualized portions of the skeleton. VASCULAR MEASUREMENTS PERTINENT TO TAVR: AORTA: Minimal Aortic Diameter-11 x 13 mm Severity of Aortic Calcification-severe RIGHT PELVIS: Right Common Iliac Artery - Minimal Diameter-9.6 x 8.9 mm  Tortuosity-mild Calcification-moderate Right External Iliac Artery - Minimal Diameter-8.4 x 8.4 mm Tortuosity-mild Calcification-mild Right Common Femoral Artery - Minimal Diameter-8.3 x 7.6 mm Tortuosity-mild Calcification-mild LEFT PELVIS: Left Common Iliac Artery - Minimal Diameter-8.7 x 8.3 mm Tortuosity-mild Calcification-moderate Left External Iliac Artery - Minimal Diameter-8.0 x 6.8 mm Tortuosity-mild Calcification-mild Left Common Femoral Artery - Minimal Diameter-8.8 x 6.0 mm Tortuosity-mild Calcification-mild Review of the MIP images confirms the above findings. IMPRESSION: 1. Vascular findings and measurements pertinent to potential TAVR procedure, as detailed above. 2. Severe thickening calcification of the aortic valve, compatible with  reported clinical history of severe aortic stenosis. 3. Aortic atherosclerosis, in addition to left main and 3 vessel coronary artery disease. 4. Severe colonic diverticulosis without evidence of acute diverticulitis at this time. 5. Additional incidental findings, as above. Electronically Signed   By: Vinnie Langton M.D.   On: 01/18/2020 11:49   VAS US CAROTID  Result Date: 01/18/2020 Carotid Arterial Duplex Study Indications:      Pre-op for TAVR. Risk Factors:     Hypertension, hyperlipidemia, Diabetes, coronary artery                   disease. Comparison Study: No priors. Performing Technologist: Oda Cogan RDMS, RVT  Examination Guidelines: A complete evaluation includes B-mode imaging, spectral Doppler, color Doppler, and power Doppler as needed of all accessible portions of each vessel. Bilateral testing is considered an integral part of a complete examination. Limited examinations for reoccurring indications may be performed as noted.  Right Carotid Findings: +----------+--------+--------+--------+------------------+------------------+           PSV cm/sEDV cm/sStenosisPlaque DescriptionComments           +----------+--------+--------+--------+------------------+------------------+ CCA Prox  87      12                                                   +----------+--------+--------+--------+------------------+------------------+ CCA Distal114     18      <50%    heterogenous                         +----------+--------+--------+--------+------------------+------------------+ ICA Prox  44      16      1-39%                     intimal thickening +----------+--------+--------+--------+------------------+------------------+ ICA Distal68      22                                                   +----------+--------+--------+--------+------------------+------------------+ +----------+--------+-------+----------------+-------------------+           PSV cm/sEDV  cmsDescribe        Arm Pressure (mmHG) +----------+--------+-------+----------------+-------------------+ [id number]             Multiphasic, WNL                    +----------+--------+-------+----------------+-------------------+ +---------+--------+--+--------+--+---------+ VertebralPSV cm/s32EDV cm/s14Antegrade +---------+--------+--+--------+--+---------+  Left Carotid Findings: +----------+--------+--------+--------+------------------+------------------+           PSV cm/sEDV cm/sStenosisPlaque DescriptionComments           +----------+--------+--------+--------+------------------+------------------+ CCA Prox  85      19                                                   +----------+--------+--------+--------+------------------+------------------+  CCA Distal60      15                                intimal thickening +----------+--------+--------+--------+------------------+------------------+ ICA Prox  62      17      1-39%                     intimal thickening +----------+--------+--------+--------+------------------+------------------+ ICA Distal56      18                                                   +----------+--------+--------+--------+------------------+------------------+ ECA       64      11                                                   +----------+--------+--------+--------+------------------+------------------+ +----------+--------+--------+----------------+-------------------+           PSV cm/sEDV cm/sDescribe        Arm Pressure (mmHG) +----------+--------+--------+----------------+-------------------+ [id number]              Multiphasic, WNL                    +----------+--------+--------+----------------+-------------------+ +---------+--------+--+--------+--+---------+ VertebralPSV cm/s35EDV cm/s11Antegrade +---------+--------+--+--------+--+---------+   Summary: Right Carotid: Velocities in the right  ICA are consistent with a 1-39% stenosis. Left Carotid: Velocities in the left ICA are consistent with a 1-39% stenosis. Vertebrals:  Bilateral vertebral arteries demonstrate antegrade flow. Subclavians: Normal flow hemodynamics were seen in bilateral subclavian              arteries. *See table(s) above for measurements and observations.  Electronically signed by Curt Jews MD on 01/18/2020 at 22:14:39 PM.    Final    ECHOCARDIOGRAM LIMITED  Result Date: 01/31/2020    ECHOCARDIOGRAM LIMITED REPORT   Patient Name:   Brian [name] Date of Exam: 01/31/2020 Medical Rec #:  [id number]         Height:       71.0 in Accession #:    [id number]        Weight:       141.3 lb Date of Birth:  08/06/1934         BSA:          1.819 m Patient Age:    49 years          BP:           131/82 mmHg Patient Gender: M                 HR:           88 bpm. Exam Location:  Inpatient Procedure: Limited Echo, Color Doppler and Cardiac Doppler Indications:    Aortic Stenosis 424.1 / 135.0  History:        Patient has prior history of Echocardiogram examinations, most                 recent 09/29/2019. CHF; Risk Factors:Hypertension, Dyslipidemia                 and Diabetes.  Aortic Valve: 26 mm Edwards Sapien prosthetic, stented (TAVR)                 valve is present in the aortic position. Procedure Date:                 01/31/2020.  Sonographer:    Darlina Sicilian RDCS Referring Phys: [place]  1. Left ventricular ejection fraction, by estimation, is 60 to 65%. The left ventricle has normal function. There is moderate left ventricular hypertrophy. Left ventricular diastolic function could not be evaluated.  2. Right ventricular systolic function is normal. The right ventricular size is normal.  3. The mitral valve is normal in structure. Trivial mitral valve regurgitation.  4. Pre TAVR: tri leaflet AV severely calcified with restricted leaflet motion Sub optimal OR images with patient laying  supine peak velocity 2.7 m/sec peak gradient 30 mmHg mean 15 mmHg AVA 0.7 cm2         Post TAVR: well positioned 26 mm Sapien 3 valve with no significant PVL Peak velocity 1 m/sec mean gradient 1 peak 3 mmHg AVA 1.9 cm2 . The aortic valve has been repaired/replaced. Aortic valve regurgitation is not visualized. Severe aortic valve stenosis. There is a 26 mm Edwards Sapien prosthetic (TAVR) valve present in the aortic position. Procedure Date: 01/31/2020. FINDINGS  Left Ventricle: Left ventricular ejection fraction, by estimation, is 60 to 65%. The left ventricle has normal function. The left ventricular internal cavity size was normal in size. There is moderate left ventricular hypertrophy. Right Ventricle: The right ventricular size is normal. No increase in right ventricular wall thickness. Right ventricular systolic function is normal. Left Atrium: Left atrial size was normal in size. Right Atrium: Right atrial size was normal in size. Pericardium: There is no evidence of pericardial effusion. Mitral Valve: The mitral valve is normal in structure. There is mild thickening of the mitral valve leaflet(s). There is mild calcification of the mitral valve leaflet(s). Trivial mitral valve regurgitation. Tricuspid Valve: The tricuspid valve is normal in structure. Tricuspid valve regurgitation is mild. Aortic Valve: Pre TAVR: tri leaflet AV severely calcified with restricted leaflet motion Sub optimal OR images with patient laying supine peak velocity 2.7 m/sec peak gradient 30 mmHg mean 15 mmHg AVA 0.7 cm2 Post TAVR: well positioned 26 mm Sapien 3 valve with no significant PVL Peak velocity 1 m/sec mean gradient 1 peak 3 mmHg AVA 1.9 cm2. The aortic valve has been repaired/replaced. Aortic valve regurgitation is not visualized. Severe aortic stenosis is present. Aortic valve mean gradient measures 8.0 mmHg. Aortic valve peak gradient measures 13.1 mmHg. Aortic valve area, by VTI measures 0.76 cm. There is a 26 mm  Edwards Sapien prosthetic, stented (TAVR) valve present in the aortic position. Procedure Date: 01/31/2020. Pulmonic Valve: The pulmonic valve was not assessed. IAS/Shunts: The interatrial septum was not assessed.  LEFT VENTRICLE PLAX 2D LVOT diam:     2.00 cm LV SV:         35 LV SV Index:   19 LVOT Area:     3.14 cm  AORTIC VALVE AV Area (Vmax):    0.87 cm AV Area (Vmean):   0.97 cm AV Area (VTI):     0.76 cm AV Vmax:           180.90 cm/s AV Vmean:          117.500 cm/s AV VTI:            0.454 m  AV Peak Grad:      13.1 mmHg AV Mean Grad:      8.0 mmHg LVOT Vmax:         50.20 cm/s LVOT Vmean:        36.400 cm/s LVOT VTI:          0.110 m LVOT/AV VTI ratio: 0.24  SHUNTS Systemic VTI:  0.11 m Systemic Diam: 2.00 cm Jenkins Rouge MD Electronically signed by Jenkins Rouge MD Signature Date/Time: 01/31/2020/12:36:49 PM    Final    Structural Heart Procedure  Result Date: 01/31/2020 See surgical note for result.  CT Angio Abd/Pel w/ and/or w/o  Result Date: 01/18/2020 CLINICAL DATA:  86 year old male with history of aortic stenosis. Preprocedural study prior to potential transcatheter aortic valve replacement (TAVR) procedure. EXAM: CT ANGIOGRAPHY CHEST, ABDOMEN AND PELVIS TECHNIQUE: Non-contrast CT of the chest was initially obtained. Multidetector CT imaging through the chest, abdomen and pelvis was performed using the standard protocol during bolus administration of intravenous contrast. Multiplanar reconstructed images and MIPs were obtained and reviewed to evaluate the vascular anatomy. CONTRAST:  192mL OMNIPAQUE IOHEXOL 350 MG/ML SOLN COMPARISON:  CT the abdomen and pelvis 11/28/2013. FINDINGS: CTA CHEST FINDINGS Cardiovascular: Heart size is normal. There is no significant pericardial fluid, thickening or pericardial calcification. There is aortic atherosclerosis, as well as atherosclerosis of the great vessels of the mediastinum and the coronary arteries, including calcified atherosclerotic plaque in  the left main, left anterior descending, left circumflex and right coronary arteries. Severe thickening calcification of the aortic valve. Mediastinum/Lymph Nodes: No pathologically enlarged mediastinal or hilar lymph nodes. Esophagus is unremarkable in appearance. No axillary lymphadenopathy. Lungs/Pleura: No suspicious appearing pulmonary nodules or masses are noted. No acute consolidative airspace disease. No pleural effusions. Musculoskeletal/Soft Tissues: There are no aggressive appearing lytic or blastic lesions noted in the visualized portions of the skeleton. CTA ABDOMEN AND PELVIS FINDINGS Hepatobiliary: No suspicious cystic or solid hepatic lesions. No intra or extrahepatic biliary ductal dilatation. Gallbladder is normal in appearance. Pancreas: No pancreatic mass. No pancreatic ductal dilatation. No pancreatic or peripancreatic fluid collections or inflammatory changes. Spleen: Unremarkable. Adrenals/Urinary Tract: Multiple low-attenuation lesions in the kidneys bilaterally, compatible with simple cysts, measuring up to 2.2 cm in the upper pole the left kidney. Mild thickening of the left adrenal gland suggestive of hyperplasia. Right adrenal gland is normal in appearance. No hydroureteronephrosis. Urinary bladder is normal in appearance. Stomach/Bowel: Normal appearance of the stomach. No pathologic dilatation of small bowel or colon. Numerous colonic diverticulae are noted, particularly in the descending colon and sigmoid colon, without surrounding inflammatory changes to suggest an acute diverticulitis at this time. The appendix is not confidently identified and may be surgically absent. Regardless, there are no inflammatory changes noted adjacent to the cecum to suggest the presence of an acute appendicitis at this time. Vascular/Lymphatic: Aortic atherosclerosis, without evidence of aneurysm or dissection in the abdominal or pelvic vasculature. Vascular findings and measurements pertinent to  potential TAVR procedure, as detailed below. No lymphadenopathy noted in the abdomen or pelvis. Reproductive: Prostate gland and seminal vesicles are unremarkable in appearance. Other: No significant volume of ascites.  No pneumoperitoneum. Musculoskeletal: There are no aggressive appearing lytic or blastic lesions noted in the visualized portions of the skeleton. VASCULAR MEASUREMENTS PERTINENT TO TAVR: AORTA: Minimal Aortic Diameter-11 x 13 mm Severity of Aortic Calcification-severe RIGHT PELVIS: Right Common Iliac Artery - Minimal Diameter-9.6 x 8.9 mm Tortuosity-mild Calcification-moderate Right External Iliac Artery - Minimal Diameter-8.4 x 8.4 mm Tortuosity-mild  Calcification-mild Right Common Femoral Artery - Minimal Diameter-8.3 x 7.6 mm Tortuosity-mild Calcification-mild LEFT PELVIS: Left Common Iliac Artery - Minimal Diameter-8.7 x 8.3 mm Tortuosity-mild Calcification-moderate Left External Iliac Artery - Minimal Diameter-8.0 x 6.8 mm Tortuosity-mild Calcification-mild Left Common Femoral Artery - Minimal Diameter-8.8 x 6.0 mm Tortuosity-mild Calcification-mild Review of the MIP images confirms the above findings. IMPRESSION: 1. Vascular findings and measurements pertinent to potential TAVR procedure, as detailed above. 2. Severe thickening calcification of the aortic valve, compatible with reported clinical history of severe aortic stenosis. 3. Aortic atherosclerosis, in addition to left main and 3 vessel coronary artery disease. 4. Severe colonic diverticulosis without evidence of acute diverticulitis at this time. 5. Additional incidental findings, as above. Electronically Signed   By: Vinnie Langton M.D.   On: 01/18/2020 11:49   Disposition   Pt is being discharged home today in good condition.  Follow-up Plans & Appointments    Follow-up Information    Eileen Stanford, PA-C. Go on 02/08/2020.   Specialties: Cardiology, Radiology Why: @ 3:30pm, please arrive at least 10 minutes early.   Contact information: [address]  [place] [address] [phone]        [facility] Follow up.   Why: rolling walker Contact information: [place] [address] [phone]            Discharge Medications   Allergies as of 02/01/2020      Reactions   Carvedilol Other (See Comments)   bradycardia   Niaspan [niacin Er]    Shellfish Allergy Other (See Comments)   Pt vomits   Statins Other (See Comments)   Myalgia   Sulfa Antibiotics    Zetia [ezetimibe]    Januvia [sitagliptin] Rash      Medication List    TAKE these medications   Accu-Chek Guide test strip Generic drug: glucose blood USE UP TO 4 TIMES A DAY AS DIRECTED   Accu-Chek Softclix Lancets lancets   amLODipine 10 MG tablet Commonly known as: NORVASC TAKE 1 TABLET (10 MG TOTAL) BY MOUTH DAILY. (FOR HIGH BLOOD PRESSURE) What changed: additional instructions   aspirin EC 81 MG tablet Take 81 mg by mouth daily.   docusate sodium 100 MG capsule Commonly known as: DOK Take 1 capsule (100 mg total) by mouth 2 (two) times daily as needed.   finasteride 5 MG tablet Commonly known as: PROSCAR Take 1 tablet (5 mg total) by mouth daily.   Fish Oil 1000 MG Caps Take 1 capsule (1,000 mg total) by mouth daily.   Insulin Pen Needle 31G X 5 MM Misc Commonly known as: B-D UF III MINI PEN NEEDLES USE AS DIRECTED WITH LEVEMIR INSULIN PENS AT BEDTIME, once a day   Levemir FlexTouch 100 UNIT/ML FlexPen Generic drug: insulin detemir Inject 10-20 Units into the skin daily. What changed:   how much to take  when to take this   quinapril 20 MG tablet Commonly known as: ACCUPRIL TAKE 1 TABLET BY MOUTH TWICE A DAY What changed: when to take this   rosuvastatin 5 MG tablet Commonly known as: CRESTOR TAKE ONE TABLET BY MOUTH FOUR NIGHTS A WEEK What changed:   how much to take  how to take this  when to take this   tamsulosin 0.4 MG Caps  capsule Commonly known as: FLOMAX TAKE 1 CAPSULE BY MOUTH EVERY DAY            Durable Medical Equipment  (From admission, onward)  Start     Ordered   02/01/20 1032  For home use only DME Walker rolling  Once    Question Answer Comment  Walker: With [place]   Patient needs a walker to treat with the following condition Weakness      02/01/20 1032              Outstanding Labs/Studies   none  Duration of Discharge Encounter   Greater than 30 minutes including physician time.  SignedAngelena Form, PA-C 02/01/2020, 11:39 AM [phone]

## 2020-02-01 NOTE — Progress Notes (Signed)
CARDIAC REHAB PHASE I   PRE:  Rate/Rhythm: 99 SR  BP:  Sitting: 118/64      SaO2: 100 RA  MODE:  Ambulation: 350 ft   POST:  Rate/Rhythm: 130 ST  BP:  Sitting: 131/73    SaO2: 100 RA  Pt ambulated 331ft in hallway assist of one with front wheel walker and gait belt. Pt denies pain, dizziness, or SOB. Pt returned to bed, fall mats and side rails replaced. Daughter at bedside. Spoke with daughter about site care and restrictions. Encouraged continued ambulation. Requesting walker for home use, RN made aware. Daughter states she will be staying in town for a few days. Stressed importance of safety. Hopeful for d/c today, waiting on echo.   [phone] Rufina Falco, RN BSN 02/01/2020 10:01 AM

## 2020-02-01 NOTE — TOC Progression Note (Signed)
Transition of Care [facility]) - Progression Note    Patient Details  Name: Brian [name] MRN: [id number] Date of Birth: 1934-06-03  Transition of Care [facility] CM/SW Contact  Zenon Mayo, RN Phone Number: 02/01/2020, 11:55 AM  Clinical Narrative:    Patient requested rolling walker with cardiac rehab, NCM made referral to [facility] with adapt , they brought walker up to room.        Expected Discharge Plan and Services           Expected Discharge Date: 02/01/20                                     Social Determinants of Health (SDOH) Interventions    Readmission Risk Interventions No flowsheet data found.

## 2020-02-01 NOTE — Progress Notes (Signed)
Patient refused morning labs.

## 2020-02-01 NOTE — Progress Notes (Signed)
Patient attempted to get out of bed. Entered the room to find the patient's legs partially out of the bed. Attempted to reorient patient. Patient did not know where he was at. Maybe some "mill". Patient pulled the condom cath off. Bed was changed, new condom cath applied.   Patient was reoriented repeatedly. Patient still has confusion. Answers name, year, birth date and president. Patient is still unaware of where he is. Patient rambles and still can not tell me where he is at.   Patient was reassured that he had the condom cath on and that it was okay to urinate. Will continue to monitor

## 2020-02-01 NOTE — Progress Notes (Signed)
D/c tele and IV. Went over AVS with pt and pt's daughter and all questions were answered. Sending pt home with a walker and AVS.   Lavenia Atlas, RN

## 2020-02-02 ENCOUNTER — Telehealth: Payer: Self-pay | Admitting: Physician Assistant

## 2020-02-02 LAB — ECHOCARDIOGRAM LIMITED
Height: 71 in
Weight: 2190.49 oz

## 2020-02-02 MED FILL — Magnesium Sulfate Inj 50%: INTRAMUSCULAR | Qty: 10 | Status: AC

## 2020-02-02 MED FILL — Potassium Chloride Inj 2 mEq/ML: INTRAVENOUS | Qty: 40 | Status: AC

## 2020-02-02 MED FILL — Heparin Sodium (Porcine) Inj 1000 Unit/ML: INTRAMUSCULAR | Qty: 30 | Status: AC

## 2020-02-02 NOTE — Telephone Encounter (Signed)
  [place] VALVE TEAM   Patient contacted regarding discharge from [facility] on 02/01/2020  Patient understands to follow up with provider Nell Range on 6/16 at [facility].  Patient understands discharge instructions? yes Patient understands medications and regimen? yes Patient understands to bring all medications to this visit? yes  Angelena Form PA-C  [facility]

## 2020-02-07 NOTE — Progress Notes (Signed)
[facility] [place]                                       Cardiology Office Note    Date:  02/08/2020   ID:  Brian [name], DOB 12/06/1933, MRN [id number]  PCP:  Delsa Grana, PA-C  Cardiologist:  Dr. Hubbard Hartshorn / Dr. Buena Irish & Dr. Roxy Manns (TAVR)  CC: TOC s/p TAVR  History of Present Illness:  Brian [name] is a 86 y.o. male with a history of HTN, chronic diastolic CHF, HLD, stage III CKD, IDDM, chronic anemia, dementia and severe AS s/p TAVR (01/31/20) who presents to clinic for follow up   Patient has been followed for several years by Dr. Oval Linsey with known history of aortic stenosis and hypertension. Previous echocardiograms have documented the presence of normal left ventricular systolic function with aortic stenosis that has slowly progressed in severity. Over the past year the patient has developed increased tendency to get fatigued with decreased exercise tolerance and weight loss. Recent follow-up echocardiogram revealed significant progression of aortic stenosis with peak velocity across aortic valve measured greater than 3.3 m/s corresponding to mean transvalvular gradient estimated 37 mmHg. DVI was reported 0.32. Left ventricular systolic function remain normal. Cardiac catheterization revealed mild to moderate nonobstructive coronary artery disease. Mean transvalvular gradient across aortic valve was reported 25 mmHg. Right heart pressures were normal.  He was evaluated by the multidisciplinary valve team and underwemt successful TAVR with a 26 mm Edwards Sapien 3 Ultra THV via the TF approach on 01/31/20. Post operative echo showed EF 60%, normally functioning TAVR with a mean gradient of 7 mm Hg and no PVL. His hospital course was c/b acute delirium and a passing out episode. He was discharged on aspirin alone given elderly age, frailty and gait instability with high fall risk.  Today he presents to clinic for follow up. Here  with his wife. No complaints other than he cannot gain weight despite eating well. He has reportedly lost 30 lbs over in the past few weeks. Wife thinks he lost weight because of his chronic shoulder pain. No CP or SOB. No LE edema, orthopnea or PND. No dizziness or syncope. No blood in stool or urine. No palpitations. He overall is not able to tell a big difference since TAVR, but doesn't feel badly. Daughter thinks coloring is better.     Past Medical History:  Diagnosis Date  . Anemia of chronic renal failure 05/08/2015  . Aortic stenosis, severe 09/21/2015  . Arthritis   . Bladder stone   . Chronic kidney disease (CKD), stage III (moderate) 05/08/2015   Followed by Dr. Rolly Salter   . Congestive heart failure ([place]) 03/14/2019  . Dementia [facility])    patient will wander off if wife is not present  . Diastolic dysfunction without heart failure 06/08/2017  . Essential hypertension 05/08/2015  . History of bladder stone   . Hyperlipidemia   . Hypertension   . Nocturia   . Renal disorder   . 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 Mikey College and Roxy Manns  . Type 2 diabetes mellitus ([place])     Past Surgical History:  Procedure Laterality Date  . COLONOSCOPY    . CYSTOSCOPY/RETROGRADE/URETEROSCOPY/STONE EXTRACTION WITH BASKET N/A 03/22/2013   Procedure: CYSTOSCOPY BLADDER  STONE STONE EXTRACTION;  Surgeon: [name], MD;  Location: [facility];  Service: Urology;  Laterality: N/A;  CYSTOSCOPY    . INTRAOPERATIVE TRANSTHORACIC ECHOCARDIOGRAM N/A 01/31/2020   Procedure: INTRAOPERATIVE TRANSTHORACIC ECHOCARDIOGRAM;  Surgeon: Burnell Blanks, MD;  Location: [facility];  Service: Open Heart Surgery;  Laterality: N/A;  . RIGHT/LEFT HEART CATH AND CORONARY ANGIOGRAPHY N/A 01/12/2020   Procedure: RIGHT/LEFT HEART CATH AND CORONARY ANGIOGRAPHY;  Surgeon: Burnell Blanks, MD;  Location: [facility];  Service: Cardiovascular;  Laterality: N/A;  . TONSILLECTOMY    . TRANSCATHETER AORTIC VALVE REPLACEMENT, TRANSFEMORAL N/A 01/31/2020   Procedure: TRANSCATHETER AORTIC VALVE REPLACEMENT, TRANSFEMORAL;  Surgeon: Burnell Blanks, MD;  Location: [facility];  Service: Open Heart Surgery;  Laterality: N/A;    Current Medications: Outpatient Medications Prior to Visit  Medication Sig Dispense Refill  . ACCU-CHEK GUIDE test strip USE UP TO 4 TIMES A DAY AS DIRECTED 100 strip 0  . Accu-Chek Softclix Lancets lancets     . aspirin EC 81 MG tablet Take 81 mg by mouth daily.     Marland Kitchen docusate sodium (DOK) 100 MG capsule Take 1 capsule (100 mg total) by mouth 2 (two) times daily as needed. 100 capsule 5  . finasteride (PROSCAR) 5 MG tablet Take 1 tablet (5 mg total) by mouth daily. 90 tablet 3  . Insulin Detemir (LEVEMIR FLEXTOUCH) 100 UNIT/ML Pen Inject 10-20 Units into the skin daily. 15 mL 1  . Insulin Pen Needle (B-D UF III MINI PEN NEEDLES) 31G X 5 MM MISC USE AS DIRECTED WITH LEVEMIR INSULIN PENS AT BEDTIME, once a day 100 each 3  . Omega-3 Fatty Acids (FISH OIL) 1000 MG CAPS Take 1 capsule (1,000 mg total) by mouth daily. 90 capsule 3  . quinapril (ACCUPRIL) 20 MG tablet TAKE 1 TABLET BY MOUTH TWICE A DAY 180 tablet 2  . rosuvastatin (CRESTOR) 5 MG tablet TAKE ONE TABLET BY MOUTH FOUR NIGHTS A WEEK 50 tablet 0  . tamsulosin (FLOMAX) 0.4 MG CAPS capsule TAKE 1 CAPSULE BY MOUTH EVERY DAY 30 capsule 5  . amLODipine (NORVASC) 10 MG tablet TAKE 1 TABLET (10 MG TOTAL) BY MOUTH DAILY. (FOR HIGH BLOOD PRESSURE) (Patient taking differently: Take 10 mg by mouth daily. HOLD) 90 tablet 2   No facility-administered medications prior to visit.     Allergies:   Carvedilol, Niaspan [niacin er], Shellfish allergy, Statins, Sulfa antibiotics, Zetia [ezetimibe], and Januvia [sitagliptin]   Social History   Socioeconomic History  . Marital status: Married    Spouse name: [name]  . Number of children: 4  .  Years of education: some college  . Highest education level: 12th grade  Occupational History  . Occupation: Engineer, manufacturing systems  Tobacco Use  . Smoking status: Former Smoker    Packs/day: 0.75    Years: 39.00    Pack years: 29.25    Types: Cigarettes    Quit date: 03/21/1993    Years since quitting: 26.9  . Smokeless tobacco: Never Used  . Tobacco comment: smoking cessation materials not required  Vaping Use  . Vaping Use: Never used  Substance and Sexual Activity  . Alcohol use: No    Alcohol/week: 0.0 standard drinks  . Drug use: No  . Sexual activity: Yes  Other Topics Concern  . Not on file  Social History Narrative   Lives with wife in [place], [place].   Social Determinants of Health   Financial Resource Strain: Low Risk   .  Difficulty of Paying Living Expenses: Not very hard  Food Insecurity: No Food Insecurity  . Worried About Charity fundraiser in the Last Year: Never true  . Ran Out of Food in the Last Year: Never true  Transportation Needs: No Transportation Needs  . Lack of Transportation (Medical): No  . Lack of Transportation (Non-Medical): No  Physical Activity: Inactive  . Days of Exercise per Week: 0 days  . Minutes of Exercise per Session: 0 min  Stress: No Stress Concern Present  . Feeling of Stress : Not at all  Social Connections: Moderately Integrated  . Frequency of Communication with Friends and Family: More than three times a week  . Frequency of Social Gatherings with Friends and Family: More than three times a week  . Attends Religious Services: More than 4 times per year  . Active Member of Clubs or Organizations: No  . Attends Archivist Meetings: Never  . Marital Status: Married     Family History:  The patient's family history includes Congestive Heart Failure in an other family member; Diabetes in his brother; Heart attack in an other family member; Heart disease in an other family member; Hyperlipidemia in his father.      ROS:   Please see the history of present illness.    ROS All other systems reviewed and are negative.   PHYSICAL EXAM:   VS:  BP 118/70   Pulse 91   Wt 134 lb (60.8 kg)   SpO2 98%   BMI 18.69 kg/m    GEN: Well nourished, well developed, in no acute distress HEENT: normal Neck: no JVD or masses Cardiac: RRR; no murmurs, rubs, or gallops,no edema  Respiratory:  clear to auscultation bilaterally, normal work of breathing GI: soft, nontender, nondistended, + BS MS: no deformity or atrophy Skin: warm and dry, no rash.  Groin sites clear without hematoma or ecchymosis  Neuro:  Alert and Oriented x 3, Strength and sensation are intact Psych: euthymic mood, full affect   Wt Readings from Last 3 Encounters:  02/08/20 134 lb (60.8 kg)  01/31/20 136 lb 14.5 oz (62.1 kg)  01/27/20 141 lb 6.4 oz (64.1 kg)      Studies/Labs Reviewed:   EKG:  EKG is ordered today.  The ekg ordered today demonstrates sinus with HR 92  Recent Labs: 08/22/2019: TSH 1.71 01/27/2020: ALT 10; B Natriuretic Peptide 53.3 02/01/2020: BUN 11; Creatinine, Ser 1.05; Hemoglobin 10.5; Magnesium 1.5; Platelets 234; Potassium 3.5; Sodium 135   Lipid Panel    Component Value Date/Time   CHOL 174 11/21/2019 0904   TRIG 77 11/21/2019 0904   HDL 51 11/21/2019 0904   CHOLHDL 3.4 11/21/2019 0904   VLDL 17 02/27/2017 0844   LDLCALC 106 (H) 11/21/2019 0904    Additional studies/ records that were reviewed today include:  TAVR OPERATIVE NOTE   Date of Procedure:01/31/2020  Preoperative Diagnosis:Severe Aortic Stenosis   Postoperative Diagnosis:Same   Procedure:   Transcatheter Aortic Valve Replacement - PercutaneousRightTransfemoral Approach Edwards Sapien 3 Ultra THV (size 12mm, model # 9750TFX, serial # [id number])  Co-Surgeons:Clarence H. Roxy Manns, MD and Lauree Chandler, MD  Anesthesiologist:William  Therisa Doyne, MD  Echocardiographer:Peter Johnsie Cancel, MD  Pre-operative Echo Findings: ? Severe aortic stenosis ? Normalleft ventricular systolic function  Post-operative Echo Findings: ? Noparavalvular leak ? Normalleft ventricular systolic function  _____________   Echo 01/31/20: IMPRESSIONS 1. Left ventricular ejection fraction, by estimation, is 60 to 65%. The left ventricle has normal function. The left  ventricle has no regional wall motion abnormalities.  2. Right ventricular systolic function is normal. The right ventricular size is normal.  3. The mitral valve is normal in structure. Trivial mitral valve regurgitation. No evidence of mitral stenosis.  4. Post TAVR with 26 mm Sapien 3 valve No PVL peak velocity 1,9 m/sec peak velocity 14 mmHg mean 7 mmHg AVA 3.1 cm2 DVI 0.63 . The aortic valve has been repaired/replaced. Aortic valve regurgitation is not visualized. No aortic stenosis is present.  5. The inferior vena cava is normal in size with greater than 50%  respiratory variability, suggesting right atrial pressure of 3 mmHg.   ASSESSMENT & PLAN:   Severe AS s/p TAVR:doing well. Groin sites healing well. ECG with no HAVB. Continue on aspirin alone given advanced age. SBE prophylaxis discussed; the patient is edentulous and does not go to the dentist. I will see him next month for follow up and echo. Pt's wife requested apts be moved out a week for convenience as they have other medical appointments that week.   HTN: BP well controlled today. No changes made.   Dementia: delirium resolved since being at home.   Weight loss: he has reportedly lost ~30 lbs and over the past several weeks. Will continue to monitor and call PCP if he continues to loose weight.    Medication Adjustments/Labs and Tests Ordered: Current medicines are reviewed at length with the patient today.  Concerns regarding medicines are outlined above.  Medication changes, Labs and  Tests ordered today are listed in the Patient Instructions below. Patient Instructions  Medication Instructions:  Your provider recommends that you continue on your current medications as directed. Please refer to the Current Medication list given to you today.    Your provider discussed the importance of taking an antibiotic prior to all dental visits to prevent damage to the heart valves from infection. You will need antibiotics prescribed if you have a dentist appointment.  Follow-Up: Please keep your follow-up appointments as scheduled!    Signed, Angelena Form, PA-C  02/08/2020 4:10 PM    [facility] [address], [place], [place]  [address] Phone: [phone]; Fax: [phone]

## 2020-02-08 ENCOUNTER — Other Ambulatory Visit: Payer: Self-pay

## 2020-02-08 ENCOUNTER — Ambulatory Visit (INDEPENDENT_AMBULATORY_CARE_PROVIDER_SITE_OTHER): Payer: Medicare PPO | Admitting: Physician Assistant

## 2020-02-08 ENCOUNTER — Encounter: Payer: Self-pay | Admitting: Physician Assistant

## 2020-02-08 VITALS — BP 118/70 | HR 91 | Wt 134.0 lb

## 2020-02-08 DIAGNOSIS — F0280 Dementia in other diseases classified elsewhere without behavioral disturbance: Secondary | ICD-10-CM

## 2020-02-08 DIAGNOSIS — Z952 Presence of prosthetic heart valve: Secondary | ICD-10-CM | POA: Diagnosis not present

## 2020-02-08 DIAGNOSIS — G3109 Other frontotemporal dementia: Secondary | ICD-10-CM

## 2020-02-08 DIAGNOSIS — I10 Essential (primary) hypertension: Secondary | ICD-10-CM

## 2020-02-08 NOTE — Patient Instructions (Addendum)
Medication Instructions:  Your provider recommends that you continue on your current medications as directed. Please refer to the Current Medication list given to you today.    Your provider discussed the importance of taking an antibiotic prior to all dental visits to prevent damage to the heart valves from infection. You will need antibiotics prescribed if you have a dentist appointment.  Follow-Up: Please keep your follow-up appointments as scheduled!

## 2020-02-28 ENCOUNTER — Ambulatory Visit: Payer: Medicare PPO | Admitting: Family Medicine

## 2020-03-07 ENCOUNTER — Other Ambulatory Visit (HOSPITAL_COMMUNITY): Payer: Medicare PPO

## 2020-03-07 ENCOUNTER — Ambulatory Visit: Payer: Medicare PPO | Admitting: Physician Assistant

## 2020-03-12 ENCOUNTER — Telehealth: Payer: Self-pay | Admitting: Physician Assistant

## 2020-03-12 NOTE — Telephone Encounter (Signed)
Per wife, Patient needs to reschedule his appt with Angelena Form due to an appt conflict. Contacted Lauren but no answer - please advise.

## 2020-03-12 NOTE — Telephone Encounter (Signed)
The patient's wife has several appointments of her own coming up soon. Offered to reschedule Brian [name]' appointment several times but the first she can come is 8/12. Scheduled the patient for echo and office visit with KT 8/12. She was grateful for assistance.

## 2020-03-15 ENCOUNTER — Other Ambulatory Visit (HOSPITAL_COMMUNITY): Payer: Medicare PPO

## 2020-03-15 ENCOUNTER — Ambulatory Visit: Payer: Medicare PPO | Admitting: Physician Assistant

## 2020-04-03 ENCOUNTER — Encounter: Payer: Self-pay | Admitting: Family Medicine

## 2020-04-05 ENCOUNTER — Ambulatory Visit: Payer: Medicare PPO | Admitting: Physician Assistant

## 2020-04-05 ENCOUNTER — Encounter: Payer: Self-pay | Admitting: Physician Assistant

## 2020-04-05 ENCOUNTER — Other Ambulatory Visit: Payer: Self-pay

## 2020-04-05 ENCOUNTER — Ambulatory Visit (HOSPITAL_COMMUNITY): Payer: Medicare PPO | Attending: Cardiology

## 2020-04-05 VITALS — BP 130/76 | HR 90 | Ht 71.0 in | Wt 137.0 lb

## 2020-04-05 DIAGNOSIS — R634 Abnormal weight loss: Secondary | ICD-10-CM

## 2020-04-05 DIAGNOSIS — Z952 Presence of prosthetic heart valve: Secondary | ICD-10-CM

## 2020-04-05 DIAGNOSIS — I10 Essential (primary) hypertension: Secondary | ICD-10-CM | POA: Diagnosis not present

## 2020-04-05 DIAGNOSIS — F0280 Dementia in other diseases classified elsewhere without behavioral disturbance: Secondary | ICD-10-CM

## 2020-04-05 DIAGNOSIS — G3109 Other frontotemporal dementia: Secondary | ICD-10-CM

## 2020-04-05 LAB — ECHOCARDIOGRAM COMPLETE
AR max vel: 2.99 cm2
AV Area VTI: 2.62 cm2
AV Area mean vel: 2.98 cm2
AV Mean grad: 4.5 mmHg
AV Peak grad: 9.1 mmHg
Ao pk vel: 1.51 m/s
Area-P 1/2: 3.17 cm2
S' Lateral: 2.4 cm

## 2020-04-05 NOTE — Patient Instructions (Addendum)
Medication Instructions:  Your provider recommends that you continue on your current medications as directed. Please refer to the Current Medication list given to you today.   *If you need a refill on your cardiac medications before your next appointment, please call your pharmacy*  Follow-Up: Your provider recommends that you schedule a follow-up appointment in 4 months with Dr. Oval Linsey.   You will be called to arrange your 1 year TAVR echo and office visit.

## 2020-04-05 NOTE — Progress Notes (Addendum)
[facility] [place]                                       Cardiology Office Note    Date:  04/05/2020   ID:  Brian [name], DOB 12/09/1933, MRN [id number]  PCP:  Brian Grana, PA-C  Cardiologist:  Dr. Oval [name] / Dr. Angelena [name] & Dr. Roxy [name] (TAVR)  CC: 1 month s/p TAVR  History of Present Illness:  ALOYSIUS [name] is a 86 y.o. male with a history of HTN, chronic diastolic CHF, HLD, stage III CKD, IDDM, chronic anemia, dementia and severe AS s/p TAVR (01/31/20) who presents to clinic for follow up   Patient has been followed for several years by Dr. Oval [name] with known history of aortic stenosis and hypertension. Previous echocardiograms have documented the presence of normal left ventricular systolic function with aortic stenosis that has slowly progressed in severity. Over the past year the patient has developed increased tendency to get fatigued with decreased exercise tolerance and weight loss. Recent follow-up echocardiogram revealed significant progression of aortic stenosis with peak velocity across aortic valve measured greater than 3.3 m/s corresponding to mean transvalvular gradient estimated 37 mmHg. DVI was reported 0.32. Left ventricular systolic function remain normal. Cardiac catheterization revealed mild to moderate nonobstructive coronary artery disease. Mean transvalvular gradient across aortic valve was reported 25 mmHg. Right heart pressures were normal.  He was evaluated by the multidisciplinary valve team and underwemt successful TAVR with a 26 mm Edwards Sapien 3 Ultra THV via the TF approach on 01/31/20. Post operative echo showed EF 60%, normally functioning TAVR with a mean gradient of 7 mm Hg and no PVL. His hospital course was c/b acute delirium and a passing out episode. He was discharged on aspirin alone given elderly age, frailty and gait instability with high fall risk. He has done well in follow up but reported  significant weight loss.   Today he presents to clinic for follow up. No CP or SOB. No LE edema, orthopnea or PND. No dizziness or syncope. No blood in stool or urine. No palpitations. Having arthritis pain. Not loosing anymore weight.     Past Medical History:  Diagnosis Date  . Anemia of chronic renal failure 05/08/2015  . Aortic stenosis, severe 09/21/2015  . Arthritis   . Bladder stone   . Chronic kidney disease (CKD), stage III (moderate) 05/08/2015   Followed by Dr. Rolly [name]   . Congestive heart failure (Brian [name]) 03/14/2019  . Dementia [facility])    patient will wander off if wife is not present  . Diastolic dysfunction without heart failure 06/08/2017  . Essential hypertension 05/08/2015  . History of bladder stone   . Hyperlipidemia   . Hypertension   . Nocturia   . Renal disorder   . 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 Mikey [name] and Brian [name]  . Type 2 diabetes mellitus ([place])     Past Surgical History:  Procedure Laterality Date  . COLONOSCOPY    . CYSTOSCOPY/RETROGRADE/URETEROSCOPY/STONE EXTRACTION WITH BASKET N/A 03/22/2013   Procedure: CYSTOSCOPY BLADDER STONE STONE EXTRACTION;  Surgeon: [name], MD;  Location: [place];  Service: Urology;  Laterality: N/A;  CYSTOSCOPY    . INTRAOPERATIVE TRANSTHORACIC ECHOCARDIOGRAM N/A 01/31/2020   Procedure: INTRAOPERATIVE TRANSTHORACIC ECHOCARDIOGRAM;  Surgeon: Burnell Blanks, MD;  Location: [place];  Service: Open Heart Surgery;  Laterality: N/A;  . RIGHT/LEFT HEART CATH AND CORONARY ANGIOGRAPHY N/A 01/12/2020   Procedure: RIGHT/LEFT HEART CATH AND CORONARY ANGIOGRAPHY;  Surgeon: Burnell Blanks, MD;  Location: [place] CV LAB;  Service: Cardiovascular;  Laterality: N/A;  . TONSILLECTOMY    . TRANSCATHETER AORTIC VALVE REPLACEMENT, TRANSFEMORAL N/A 01/31/2020   Procedure: TRANSCATHETER AORTIC VALVE REPLACEMENT,  TRANSFEMORAL;  Surgeon: Burnell Blanks, MD;  Location: [place];  Service: Open Heart Surgery;  Laterality: N/A;    Current Medications: Outpatient Medications Prior to Visit  Medication Sig Dispense Refill  . ACCU-CHEK GUIDE test strip USE UP TO 4 TIMES A DAY AS DIRECTED 100 strip 0  . Accu-Chek Softclix Lancets lancets     . aspirin EC 81 MG tablet Take 81 mg by mouth daily.     Marland Kitchen docusate sodium (DOK) 100 MG capsule Take 1 capsule (100 mg total) by mouth 2 (two) times daily as needed. 100 capsule 5  . finasteride (PROSCAR) 5 MG tablet Take 1 tablet (5 mg total) by mouth daily. 90 tablet 3  . Insulin Detemir (LEVEMIR FLEXTOUCH) 100 UNIT/ML Pen Inject 10-20 Units into the skin daily. 15 mL 1  . Insulin Pen Needle (B-D UF III MINI PEN NEEDLES) 31G X 5 MM MISC USE AS DIRECTED WITH LEVEMIR INSULIN PENS AT BEDTIME, once a day 100 each 3  . Omega-3 Fatty Acids (FISH OIL) 1000 MG CAPS Take 1 capsule (1,000 mg total) by mouth daily. 90 capsule 3  . quinapril (ACCUPRIL) 20 MG tablet TAKE 1 TABLET BY MOUTH TWICE A DAY 180 tablet 2  . rosuvastatin (CRESTOR) 5 MG tablet TAKE ONE TABLET BY MOUTH FOUR NIGHTS A WEEK 50 tablet 0  . tamsulosin (FLOMAX) 0.4 MG CAPS capsule TAKE 1 CAPSULE BY MOUTH EVERY DAY 30 capsule 5   No facility-administered medications prior to visit.     Allergies:   Carvedilol, Niaspan [niacin er], Shellfish allergy, Statins, Sulfa antibiotics, Zetia [ezetimibe], and Januvia [sitagliptin]   Social History   Socioeconomic History  . Marital status: Married    Spouse name: Brian [name]  . Number of children: 4  . Years of education: some [name]  . Highest education level: 12th grade  Occupational History  . Occupation: Engineer, manufacturing systems  Tobacco Use  . Smoking status: Former Smoker    Packs/day: 0.75    Years: 39.00    Pack years: 29.25    Types: Cigarettes    Quit date: 03/21/1993    Years since quitting: 27.0  . Smokeless tobacco: Never Used  . Tobacco comment:  smoking cessation materials not required  Vaping Use  . Vaping Use: Never used  Substance and Sexual Activity  . Alcohol use: No    Alcohol/week: 0.0 standard drinks  . Drug use: No  . Sexual activity: Yes  Other Topics Concern  . Not on file  Social History Narrative   Lives with wife in [place], [place].   Social Determinants of Health   Financial Resource Strain: Low Risk   . Difficulty of Paying Living Expenses: Not very hard  Food Insecurity: No Food Insecurity  . Worried About Charity fundraiser in the Last Year: Never true  . Ran Out of Food in the Last Year: Never true  Transportation Needs: No Transportation Needs  . Lack of Transportation (Medical): No  . Lack of Transportation (Non-Medical): No  Physical Activity: Inactive  . Days of  Exercise per Week: 0 days  . Minutes of Exercise per Session: 0 min  Stress: No Stress Concern Present  . Feeling of Stress : Not at all  Social Connections: Moderately Integrated  . Frequency of Communication with Friends and Family: More than three times a week  . Frequency of Social Gatherings with Friends and Family: More than three times a week  . Attends Religious Services: More than 4 times per year  . Active Member of Clubs or Organizations: No  . Attends Archivist Meetings: Never  . Marital Status: Married     Family History:  The patient's family history includes Congestive Heart Failure in an other family member; Diabetes in his brother; Heart attack in an other family member; Heart disease in an other family member; Hyperlipidemia in his father.     ROS:   Please see the history of present illness.    ROS All other systems reviewed and are negative.   PHYSICAL EXAM:   VS:  BP 130/76   Pulse 90   Ht 5\' 11"  (1.803 m)   Wt 137 lb (62.1 kg)   SpO2 98%   BMI 19.11 kg/m    GEN: Well nourished, well developed, in no acute distress HEENT: normal Neck: no JVD or masses Cardiac: RRR; no murmurs, rubs, or  gallops,no edema  Respiratory:  clear to auscultation bilaterally, normal work of breathing GI: soft, nontender, nondistended, + BS MS: no deformity or atrophy Skin: warm and dry, no rash.  Neuro:  Alert and Oriented x 3, Strength and sensation are intact Psych: euthymic mood, full affect   Wt Readings from Last 3 Encounters:  04/05/20 137 lb (62.1 kg)  02/08/20 134 lb (60.8 kg)  01/31/20 136 lb 14.5 oz (62.1 kg)      Studies/Labs Reviewed:   EKG:  EKG is NOT ordered today.  Recent Labs: 08/22/2019: TSH 1.71 01/27/2020: ALT 10; B Natriuretic Peptide 53.3 02/01/2020: BUN 11; Creatinine, Ser 1.05; Hemoglobin 10.5; Magnesium 1.5; Platelets 234; Potassium 3.5; Sodium 135   Lipid Panel    Component Value Date/Time   CHOL 174 11/21/2019 0904   TRIG 77 11/21/2019 0904   HDL 51 11/21/2019 0904   CHOLHDL 3.4 11/21/2019 0904   VLDL 17 02/27/2017 0844   LDLCALC 106 (H) 11/21/2019 0904    Additional studies/ records that were reviewed today include:  TAVR OPERATIVE NOTE   Date of Procedure:01/31/2020  Preoperative Diagnosis:Severe Aortic Stenosis   Postoperative Diagnosis:Same   Procedure:   Transcatheter Aortic Valve Replacement - PercutaneousRightTransfemoral Approach Edwards Sapien 3 Ultra THV (size 63mm, model # 9750TFX, serial # [id number])  Co-Surgeons:Clarence H. Brian Manns, MD and Lauree Chandler, MD  Anesthesiologist:William Therisa Doyne, MD  Echocardiographer:Peter Johnsie Cancel, MD  Pre-operative Echo Findings: ? Severe aortic stenosis ? Normalleft ventricular systolic function  Post-operative Echo Findings: ? Noparavalvular leak ? Normalleft ventricular systolic function  _____________   Echo 01/31/20: IMPRESSIONS 1. Left ventricular ejection fraction, by estimation, is 60 to 65%. The left ventricle has normal function. The left ventricle  has no regional wall motion abnormalities.  2. Right ventricular systolic function is normal. The right ventricular size is normal.  3. The mitral valve is normal in structure. Trivial mitral valve regurgitation. No evidence of mitral stenosis.  4. Post TAVR with 26 mm Sapien 3 valve No PVL peak velocity 1,9 m/sec peak velocity 14 mmHg mean 7 mmHg AVA 3.1 cm2 DVI 0.63 . The aortic valve has been repaired/replaced. Aortic valve regurgitation is not visualized.  No aortic stenosis is present.  5. The inferior vena cava is normal in size with greater than 50%  respiratory variability, suggesting right atrial pressure of 3 mmHg.   _______________  Echo 04/05/20 IMPRESSIONS  1. Left ventricular ejection fraction, by estimation, is 60 to 65%. The  left ventricle has normal function. The left ventricle has no regional  wall motion abnormalities. There is mild concentric left ventricular  hypertrophy. Left ventricular diastolic  parameters are indeterminate.  2. Right ventricular systolic function is normal. The right ventricular  size is mildly enlarged. Tricuspid regurgitation signal is inadequate for  assessing PA pressure.  3. The mitral valve is normal in structure. Trivial mitral valve  regurgitation. No evidence of mitral stenosis.  4. The aortic valve has been repaired/replaced. Aortic valve  regurgitation is trivial. There is a 26 mm Edwards Sapien prosthetic  (TAVR) valve present in the aortic position. Procedure Date: 01/31/2020.  Echo findings are consistent with normal structure  and function of the aortic valve prosthesis. Aortic valve area, by VTI  measures 2.62 cm. Aortic valve mean gradient measures 4.5 mmHg. Aortic  valve Vmax measures 1.50 m/s.  5. The inferior vena cava is normal in size with <50% respiratory  variability, suggesting right atrial pressure of 8 mmHg.  Comparison(s): Prior images reviewed side by side.  Conclusion(s)/Recommendation(s): Stable post-TAVR.  No significant change  from prior study.   ASSESSMENT & PLAN:   Severe AS s/p TAVR:Echo today shows EF 60%, normally functioning TAVR with a mean gradient of 4.5 mmHg and no PVL. He has NYHA class I symptoms. Continue on aspirin alone given advanced age and high fall risk. SBE prophylaxis discussed; the patient is edentulous and does not go to the dentist. He will see Dr. Oval [name] back in 4 months and follow back up with me in 1 year with an echo.   HTN: BP well controlled today. No changes made.   Dementia: delirium resolved since being at home.   Weight loss: this has tapered off and weight is actually up a couple lbs   Medication Adjustments/Labs and Tests Ordered: Current medicines are reviewed at length with the patient today.  Concerns regarding medicines are outlined above.  Medication changes, Labs and Tests ordered today are listed in the Patient Instructions below. Patient Instructions  Medication Instructions:  Your provider recommends that you continue on your current medications as directed. Please refer to the Current Medication list given to you today.   *If you need a refill on your cardiac medications before your next appointment, please call your pharmacy*  Follow-Up: Your provider recommends that you schedule a follow-up appointment in 4 months with Dr. Oval [name].   You will be called to arrange your 1 year TAVR echo and office visit.    Signed, Brian Form, PA-C  04/05/2020 2:03 PM    [facility] [address], [place], [place]  [address] Phone: [phone]; Fax: [phone]

## 2020-04-17 ENCOUNTER — Other Ambulatory Visit: Payer: Self-pay | Admitting: Family Medicine

## 2020-06-14 ENCOUNTER — Ambulatory Visit: Payer: Self-pay | Admitting: *Deleted

## 2020-06-14 NOTE — Chronic Care Management (AMB) (Signed)
CCM status changed to previously enrolled.  Delawrence Fridman, LCSW Clinical Social Worker  [facility] Care Management [phone]  

## 2020-08-02 ENCOUNTER — Ambulatory Visit: Payer: Medicare PPO | Attending: Internal Medicine

## 2020-08-02 DIAGNOSIS — Z23 Encounter for immunization: Secondary | ICD-10-CM

## 2020-08-02 NOTE — Progress Notes (Signed)
   Covid-19 Vaccination Clinic  Name:  Brian [name]    MRN: [id number] DOB: 1934-03-01  08/02/2020  [name] was observed post Covid-19 immunization for 15 minutes without incident. He was provided with Vaccine Information Sheet and instruction to access the V-Safe system.   [name] was instructed to call 911 with any severe reactions post vaccine: Marland Kitchen Difficulty breathing  . Swelling of face and throat  . A fast heartbeat  . A bad rash all over body  . Dizziness and weakness   Immunizations Administered    No immunizations on file.

## 2020-08-09 ENCOUNTER — Ambulatory Visit: Payer: Medicare PPO | Admitting: Cardiovascular Disease

## 2020-09-18 ENCOUNTER — Ambulatory Visit (INDEPENDENT_AMBULATORY_CARE_PROVIDER_SITE_OTHER): Payer: Medicare PPO

## 2020-09-18 DIAGNOSIS — Z0000 Encounter for general adult medical examination without abnormal findings: Secondary | ICD-10-CM | POA: Diagnosis not present

## 2020-09-18 NOTE — Progress Notes (Signed)
Subjective:   Brian [name] is a 87 y.o. male who presents for Medicare Annual/Subsequent preventive examination.  Virtual Visit via Telephone Note  I connected with  [name] on 09/18/20 at  1:30 PM EST by telephone and verified that I am speaking with the correct person using two identifiers.  Location: Patient: home Provider: [place] Persons participating in the virtual visit: patient & wife [facility] Advisor   I discussed the limitations, risks, security and privacy concerns of performing an evaluation and management service by telephone and the availability of in person appointments. The patient expressed understanding and agreed to proceed.  Interactive audio and video telecommunications were attempted between this nurse and patient, however failed, due to patient having technical difficulties OR patient did not have access to video capability.  We continued and completed visit with audio only.  Some vital signs may be absent or patient reported.   Clemetine Marker, LPN    Review of Systems     Cardiac Risk Factors include: advanced age (>44men, >56 women);diabetes mellitus     Objective:    There were no vitals filed for this visit. There is no height or weight on file to calculate BMI.  Advanced Directives 09/18/2020 01/27/2020 01/26/2020 01/12/2020 09/15/2019 04/30/2018 06/08/2017  Does Patient Have a Medical Advance Directive? No No No No Yes No Yes  Type of Advance Directive - - - - Press photographer;Living will - -  Does patient want to make changes to medical advance directive? - No - Guardian declined No - Patient declined No - Patient declined - - -  Copy of [place] in Chart? - - - - No - copy requested - -  Would patient like information on creating a medical advance directive? Yes (MAU/Ambulatory/Procedural Areas - Information given) No - Patient declined - - - Yes (MAU/Ambulatory/Procedural Areas - Information  given) -  Pre-existing out of facility DNR order (yellow form or pink MOST form) - - - - - - -    Current Medications (verified) Outpatient Encounter Medications as of 09/18/2020  Medication Sig  . ACCU-CHEK GUIDE test strip USE UP TO 4 TIMES A DAY AS DIRECTED  . Accu-Chek Softclix Lancets lancets   . aspirin EC 81 MG tablet Take 81 mg by mouth daily.   Marland Kitchen docusate sodium (DOK) 100 MG capsule Take 1 capsule (100 mg total) by mouth 2 (two) times daily as needed.  . finasteride (PROSCAR) 5 MG tablet Take 1 tablet (5 mg total) by mouth daily.  . hydroxychloroquine (PLAQUENIL) 200 MG tablet Take 1 tablet by mouth daily.  . Insulin Detemir (LEVEMIR FLEXTOUCH) 100 UNIT/ML Pen Inject 10-20 Units into the skin daily.  . Insulin Pen Needle (B-D UF III MINI PEN NEEDLES) 31G X 5 MM MISC USE AS DIRECTED WITH LEVEMIR INSULIN PENS AT BEDTIME, once a day  . Omega-3 Fatty Acids (FISH OIL) 1000 MG CAPS Take 1 capsule (1,000 mg total) by mouth daily.  . tamsulosin (FLOMAX) 0.4 MG CAPS capsule TAKE 1 CAPSULE BY MOUTH EVERY DAY  . quinapril (ACCUPRIL) 20 MG tablet TAKE 1 TABLET BY MOUTH TWICE A DAY (Patient not taking: Reported on 09/18/2020)  . rosuvastatin (CRESTOR) 5 MG tablet TAKE ONE TABLET BY MOUTH FOUR NIGHTS A WEEK (Patient not taking: Reported on 09/18/2020)   No facility-administered encounter medications on file as of 09/18/2020.    Allergies (verified) Carvedilol, Niaspan [niacin er], Shellfish allergy, Statins, Sulfa antibiotics, Zetia [ezetimibe], and  Januvia [sitagliptin]   History: Past Medical History:  Diagnosis Date  . Anemia of chronic renal failure 05/08/2015  . Aortic stenosis, severe 09/21/2015  . Arthritis   . Bladder stone   . Chronic kidney disease (CKD), stage III (moderate) ([place]) 05/08/2015   Followed by Dr. Rolly Salter   . Congestive heart failure ([place]) 03/14/2019  . Dementia [facility])    patient will wander off if wife is not present  . Diastolic dysfunction without heart failure  06/08/2017  . Essential hypertension 05/08/2015  . History of bladder stone   . Hyperlipidemia   . Hypertension   . Nocturia   . Renal disorder   . 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 Mikey College and Roxy Manns  . Type 2 diabetes mellitus ([place])    Past Surgical History:  Procedure Laterality Date  . COLONOSCOPY    . CYSTOSCOPY/RETROGRADE/URETEROSCOPY/STONE EXTRACTION WITH BASKET N/A 03/22/2013   Procedure: CYSTOSCOPY BLADDER STONE STONE EXTRACTION;  Surgeon: [name], MD;  Location: [place];  Service: Urology;  Laterality: N/A;  CYSTOSCOPY    . INTRAOPERATIVE TRANSTHORACIC ECHOCARDIOGRAM N/A 01/31/2020   Procedure: INTRAOPERATIVE TRANSTHORACIC ECHOCARDIOGRAM;  Surgeon: Burnell Blanks, MD;  Location: [place];  Service: Open Heart Surgery;  Laterality: N/A;  . RIGHT/LEFT HEART CATH AND CORONARY ANGIOGRAPHY N/A 01/12/2020   Procedure: RIGHT/LEFT HEART CATH AND CORONARY ANGIOGRAPHY;  Surgeon: Burnell Blanks, MD;  Location: [place] CV LAB;  Service: Cardiovascular;  Laterality: N/A;  . TONSILLECTOMY    . TRANSCATHETER AORTIC VALVE REPLACEMENT, TRANSFEMORAL N/A 01/31/2020   Procedure: TRANSCATHETER AORTIC VALVE REPLACEMENT, TRANSFEMORAL;  Surgeon: Burnell Blanks, MD;  Location: [place];  Service: Open Heart Surgery;  Laterality: N/A;   Family History  Problem Relation Age of Onset  . Hyperlipidemia Father   . Congestive Heart Failure Other   . Heart attack Other   . Heart disease Other   . Diabetes Brother    Social History   Socioeconomic History  . Marital status: Married    Spouse name: [name]  . Number of children: 4  . Years of education: some college  . Highest education level: 12th grade  Occupational History  . Occupation: Engineer, manufacturing systems  Tobacco Use  . Smoking status: Former Smoker    Packs/day: 0.75    Years: 39.00    Pack years: 29.25     Types: Cigarettes    Quit date: 03/21/1993    Years since quitting: 27.5  . Smokeless tobacco: Never Used  . Tobacco comment: smoking cessation materials not required  Vaping Use  . Vaping Use: Never used  Substance and Sexual Activity  . Alcohol use: No    Alcohol/week: 0.0 standard drinks  . Drug use: No  . Sexual activity: Yes  Other Topics Concern  . Not on file  Social History Narrative   Lives with wife in [place], [place].   Social Determinants of Health   Financial Resource Strain: Low Risk   . Difficulty of Paying Living Expenses: Not very hard  Food Insecurity: No Food Insecurity  . Worried About Charity fundraiser in the Last Year: Never true  . Ran Out of Food in the Last Year: Never true  Transportation Needs: No Transportation Needs  . Lack of Transportation (Medical): No  . Lack of Transportation (Non-Medical): No  Physical Activity: Inactive  . Days of Exercise per Week: 0 days  .  Minutes of Exercise per Session: 0 min  Stress: No Stress Concern Present  . Feeling of Stress : Not at all  Social Connections: Moderately Integrated  . Frequency of Communication with Friends and Family: More than three times a week  . Frequency of Social Gatherings with Friends and Family: More than three times a week  . Attends Religious Services: More than 4 times per year  . Active Member of Clubs or Organizations: No  . Attends Archivist Meetings: Never  . Marital Status: Married    Tobacco Counseling Counseling given: Not Answered Comment: smoking cessation materials not required   Clinical Intake:  Pre-visit preparation completed: Yes  Pain : No/denies pain     Nutritional Risks: None Diabetes: Yes CBG done?: No Did pt. bring in CBG monitor from home?: No  How often do you need to have someone help you when you read instructions, pamphlets, or other written materials from your doctor or pharmacy?: 1 - Never  Nutrition Risk Assessment:  Has  the patient had any N/V/D within the last 2 months?  No  Does the patient have any non-healing wounds?  No  Has the patient had any unintentional weight loss or weight gain?  No   Diabetes:  Is the patient diabetic?  Yes  If diabetic, was a CBG obtained today?  No  Did the patient bring in their glucometer from home?  No  How often do you monitor your CBG's? Twice daily.   Financial Strains and Diabetes Management:  Are you having any financial strains with the device, your supplies or your medication? No .  Does the patient want to be seen by Chronic Care Management for management of their diabetes?  No  Would the patient like to be referred to a Nutritionist or for Diabetic Management?  No   Diabetic Exams:  Diabetic Eye Exam: Completed 08/29/19. Overdue for diabetic eye exam. Pt has been advised about the importance in completing this exam.  Diabetic Foot Exam: Completed 11/21/19.   Interpreter Needed?: No  Information entered by :: Clemetine Marker LPN   Activities of Daily Living In your present state of health, do you have any difficulty performing the following activities: 09/18/2020 01/27/2020  Hearing? N N  Vision? N N  Difficulty concentrating or making decisions? Y N  Walking or climbing stairs? N N  Dressing or bathing? N N  Doing errands, shopping? N N  Preparing Food and eating ? N -  Using the Toilet? N -  In the past six months, have you accidently leaked urine? N -  Do you have problems with loss of bowel control? N -  Managing your Medications? Y -  Managing your Finances? Y -  Housekeeping or managing your Housekeeping? N -  Some recent data might be hidden    Patient Care Team: Delsa Grana, PA-C as PCP - General (Family Medicine) Skeet Latch, MD as Consulting Physician (Cardiology) Lavonia Dana, MD as Consulting Physician (Internal Medicine) Jacelyn Pi, MD as Referring Physician (Endocrinology)  Indicate any recent Medical Services you may  have received from other than [facility] providers in the past year (date may be approximate).     Assessment:   This is a routine wellness examination for [place].  Hearing/Vision screen  Hearing Screening   125Hz  250Hz  500Hz  1000Hz  2000Hz  3000Hz  4000Hz  6000Hz  8000Hz   Right ear:           Left ear:  Comments: Pt denies hearing difficulty  Vision Screening Comments: [facility]  Dietary issues and exercise activities discussed: Current Exercise Habits: The patient does not participate in regular exercise at present, Exercise limited by: orthopedic condition(s)  Goals Addressed            This Visit's Progress   . DIET - INCREASE WATER INTAKE   On track    Recommend to drink at least 6-8 8oz glasses of water per day.      Depression Screen PHQ 2/9 Scores 09/18/2020 12/29/2019 11/21/2019 09/15/2019 08/22/2019 08/17/2019 06/21/2019  PHQ - 2 Score 0 0 0 0 0 0 0  PHQ- 9 Score - 0 0 - 0 0 0    Fall Risk Fall Risk  09/18/2020 12/29/2019 11/21/2019 09/15/2019 08/22/2019  Falls in the past year? 0 1 1 1 1   Number falls in past yr: 0 1 1 0 1  Comment - - - - -  Injury with Fall? 0 0 0 0 0  Risk for fall due to : - History of fall(s);Impaired mobility;Impaired balance/gait - - -  Risk for fall due to: Comment - - - - -  Follow up - - - Falls prevention discussed -    FALL RISK PREVENTION PERTAINING TO THE HOME:  Any stairs in or around the home? Yes  If so, are there any without handrails? No  Home free of loose throw rugs in walkways, pet beds, electrical cords, etc? Yes  Adequate lighting in your home to reduce risk of falls? Yes   ASSISTIVE DEVICES UTILIZED TO PREVENT FALLS:  Life alert? No  Use of a cane, walker or w/c? No  Grab bars in the bathroom? Yes  Shower chair or bench in shower? No  Elevated toilet seat or a handicapped toilet? Yes   TIMED UP AND GO:  Was the test performed? No . Telephonic visit.   Cognitive Function:      6CIT Screen 09/15/2019 04/30/2018  What Year? 0 points 0 points  What month? 0 points 0 points  What time? 0 points 3 points  Count back from 20 0 points 0 points  Months in reverse 4 points 0 points  Repeat phrase 8 points 6 points  Total Score 12 9    Immunizations Immunization History  Administered Date(s) Administered  . Influenza, High Dose Seasonal PF 05/05/2016, 06/08/2017, 04/30/2018, 04/16/2019  . Influenza,inj,Quad PF,6+ Mos 05/08/2015  . Moderna SARS-COV2 Booster Vaccination 08/02/2020  . Moderna Sars-Covid-2 Vaccination 10/06/2019, 11/08/2019  . Pneumococcal Conjugate-13 03/20/2014  . Pneumococcal Polysaccharide-23 09/08/2003  . Tdap 05/03/2018    TDAP status: Up to date  Flu Vaccine status: Up to date per patient; no record.   Pneumococcal vaccine status: Up to date  Covid-19 vaccine status: Completed vaccines  Qualifies for Shingles Vaccine? Yes   Zostavax completed No   Shingrix Completed?: No.    Education has been provided regarding the importance of this vaccine. Patient has been advised to call insurance company to determine out of pocket expense if they have not yet received this vaccine. Advised may also receive vaccine at local pharmacy or Health Dept. Verbalized acceptance and understanding.  Screening Tests Health Maintenance  Topic Date Due  . INFLUENZA VACCINE  03/25/2020  . HEMOGLOBIN A1C  07/28/2020  . OPHTHALMOLOGY EXAM  08/13/2020  . FOOT EXAM  11/20/2020  . TETANUS/TDAP  05/03/2028  . COVID-19 Vaccine  Completed  . PNA vac Low Risk Adult  Addressed  Health Maintenance  Health Maintenance Due  Topic Date Due  . INFLUENZA VACCINE  03/25/2020  . HEMOGLOBIN A1C  07/28/2020  . OPHTHALMOLOGY EXAM  08/13/2020    Colorectal cancer screening: No longer required.   Lung Cancer Screening: (Low Dose CT Chest recommended if Age 76-80 years, 30 pack-year currently smoking OR have quit w/in 15years.) does not qualify.   Additional  Screening:  Hepatitis C Screening: does not qualify.  Vision Screening: Recommended annual ophthalmology exams for early detection of glaucoma and other disorders of the eye. Is the patient up to date with their annual eye exam?  Yes  Who is the provider or what is the name of the office in which the patient attends annual eye exams? [place] Screening: Recommended annual dental exams for proper oral hygiene  Community Resource Referral / Chronic Care Management: CRR required this visit?  No   CCM required this visit?  No      Plan:     I have personally reviewed and noted the following in the patient's chart:   . Medical and social history . Use of alcohol, tobacco or illicit drugs  . Current medications and supplements . Functional ability and status . Nutritional status . Physical activity . Advanced directives . List of other physicians . Hospitalizations, surgeries, and ER visits in previous 12 months . Vitals . Screenings to include cognitive, depression, and falls . Referrals and appointments  In addition, I have reviewed and discussed with patient certain preventive protocols, quality metrics, and best practice recommendations. A written personalized care plan for preventive services as well as general preventive health recommendations were provided to patient.     Clemetine Marker, LPN   [id number]/[id number]   Nurse Notes: pt's wife stated that patient has also been [facility]; advised to let us know if [facility] decide [facility] or bring [facility] records to next appt.

## 2020-09-18 NOTE — Patient Instructions (Signed)
Brian [name] , Thank you for taking time to come for your Medicare Wellness Visit. I appreciate your ongoing commitment to your health goals. Please review the following plan we discussed and let me know if I can assist you in the future.   Screening recommendations/referrals: Colonoscopy: no longer required Recommended yearly ophthalmology/optometry visit for glaucoma screening and checkup Recommended yearly dental visit for hygiene and checkup  Vaccinations: Influenza vaccine: done; need records  Pneumococcal vaccine: done 03/20/14 Tdap vaccine: done 05/03/18 Shingles vaccine: Shingrix discussed. Please contact your pharmacy for coverage information.  Covid-19: done 10/06/19 & 11/08/19  Advanced directives: Advance directive discussed with you today. I have provided a copy for you to complete at home and have notarized. Once this is complete please bring a copy in to our office so we can scan it into your chart.  Conditions/risks identified: Recommend drinking 6-8 glasses of water per day   Next appointment: Follow up in one year for your annual wellness visit.   Preventive Care 37 Years and Older, Male Preventive care refers to lifestyle choices and visits with your health care provider that can promote health and wellness. What does preventive care include?  A yearly physical exam. This is also called an annual well check.  Dental exams once or twice a year.  Routine eye exams. Ask your health care provider how often you should have your eyes checked.  Personal lifestyle choices, including:  Daily care of your teeth and gums.  Regular physical activity.  Eating a healthy diet.  Avoiding tobacco and drug use.  Limiting alcohol use.  Practicing safe sex.  Taking low doses of aspirin every day.  Taking vitamin and mineral supplements as recommended by your health care provider. What happens during an annual well check? The services and screenings done by your health care  provider during your annual well check will depend on your age, overall health, lifestyle risk factors, and family history of disease. Counseling  Your health care provider may ask you questions about your:  Alcohol use.  Tobacco use.  Drug use.  Emotional well-being.  Home and relationship well-being.  Sexual activity.  Eating habits.  History of falls.  Memory and ability to understand (cognition).  Work and work Statistician. Screening  You may have the following tests or measurements:  Height, weight, and BMI.  Blood pressure.  Lipid and cholesterol levels. These may be checked every 5 years, or more frequently if you are over 60 years old.  Skin check.  Lung cancer screening. You may have this screening every year starting at age 48 if you have a 30-pack-year history of smoking and currently smoke or have quit within the past 15 years.  Fecal occult blood test (FOBT) of the stool. You may have this test every year starting at age 47.  Flexible sigmoidoscopy or colonoscopy. You may have a sigmoidoscopy every 5 years or a colonoscopy every 10 years starting at age 71.  Prostate cancer screening. Recommendations will vary depending on your family history and other risks.  Hepatitis C blood test.  Hepatitis B blood test.  Sexually transmitted disease (STD) testing.  Diabetes screening. This is done by checking your blood sugar (glucose) after you have not eaten for a while (fasting). You may have this done every 1-3 years.  Abdominal aortic aneurysm (AAA) screening. You may need this if you are a current or former smoker.  Osteoporosis. You may be screened starting at age 88 if you are at high risk.  Talk with your health care provider about your test results, treatment options, and if necessary, the need for more tests. Vaccines  Your health care provider may recommend certain vaccines, such as:  Influenza vaccine. This is recommended every year.  Tetanus,  diphtheria, and acellular pertussis (Tdap, Td) vaccine. You may need a Td booster every 10 years.  Zoster vaccine. You may need this after age 66.  Pneumococcal 13-valent conjugate (PCV13) vaccine. One dose is recommended after age 35.  Pneumococcal polysaccharide (PPSV23) vaccine. One dose is recommended after age 7. Talk to your health care provider about which screenings and vaccines you need and how often you need them. This information is not intended to replace advice given to you by your health care provider. Make sure you discuss any questions you have with your health care provider. Document Released: 09/07/2015 Document Revised: 04/30/2016 Document Reviewed: 06/12/2015 Elsevier Interactive Patient Education  2017 [place] Prevention in the Home Falls can cause injuries. They can happen to people of all ages. There are many things you can do to make your home safe and to help prevent falls. What can I do on the outside of my home?  Regularly fix the edges of walkways and driveways and fix any cracks.  Remove anything that might make you trip as you walk through a door, such as a raised step or threshold.  Trim any bushes or trees on the path to your home.  Use bright outdoor lighting.  Clear any walking paths of anything that might make someone trip, such as rocks or tools.  Regularly check to see if handrails are loose or broken. Make sure that both sides of any steps have handrails.  Any raised decks and porches should have guardrails on the edges.  Have any leaves, snow, or ice cleared regularly.  Use sand or salt on walking paths during winter.  Clean up any spills in your garage right away. This includes oil or grease spills. What can I do in the bathroom?  Use night lights.  Install grab bars by the toilet and in the tub and shower. Do not use towel bars as grab bars.  Use non-skid mats or decals in the tub or shower.  If you need to sit down in  the shower, use a plastic, non-slip stool.  Keep the floor dry. Clean up any water that spills on the floor as soon as it happens.  Remove soap buildup in the tub or shower regularly.  Attach bath mats securely with double-sided non-slip rug tape.  Do not have throw rugs and other things on the floor that can make you trip. What can I do in the bedroom?  Use night lights.  Make sure that you have a light by your bed that is easy to reach.  Do not use any sheets or blankets that are too big for your bed. They should not hang down onto the floor.  Have a firm chair that has side arms. You can use this for support while you get dressed.  Do not have throw rugs and other things on the floor that can make you trip. What can I do in the kitchen?  Clean up any spills right away.  Avoid walking on wet floors.  Keep items that you use a lot in easy-to-reach places.  If you need to reach something above you, use a strong step stool that has a grab bar.  Keep electrical cords out of the way.  Do not use floor polish or wax that makes floors slippery. If you must use wax, use non-skid floor wax.  Do not have throw rugs and other things on the floor that can make you trip. What can I do with my stairs?  Do not leave any items on the stairs.  Make sure that there are handrails on both sides of the stairs and use them. Fix handrails that are broken or loose. Make sure that handrails are as long as the stairways.  Check any carpeting to make sure that it is firmly attached to the stairs. Fix any carpet that is loose or worn.  Avoid having throw rugs at the top or bottom of the stairs. If you do have throw rugs, attach them to the floor with carpet tape.  Make sure that you have a light switch at the top of the stairs and the bottom of the stairs. If you do not have them, ask someone to add them for you. What else can I do to help prevent falls?  Wear shoes that:  Do not have high  heels.  Have rubber bottoms.  Are comfortable and fit you well.  Are closed at the toe. Do not wear sandals.  If you use a stepladder:  Make sure that it is fully opened. Do not climb a closed stepladder.  Make sure that both sides of the stepladder are locked into place.  Ask someone to hold it for you, if possible.  Clearly mark and make sure that you can see:  Any grab bars or handrails.  First and last steps.  Where the edge of each step is.  Use tools that help you move around (mobility aids) if they are needed. These include:  Canes.  Walkers.  Scooters.  Crutches.  Turn on the lights when you go into a dark area. Replace any light bulbs as soon as they burn out.  Set up your furniture so you have a clear path. Avoid moving your furniture around.  If any of your floors are uneven, fix them.  If there are any pets around you, be aware of where they are.  Review your medicines with your doctor. Some medicines can make you feel dizzy. This can increase your chance of falling. Ask your doctor what other things that you can do to help prevent falls. This information is not intended to replace advice given to you by your health care provider. Make sure you discuss any questions you have with your health care provider. Document Released: 06/07/2009 Document Revised: 01/17/2016 Document Reviewed: 09/15/2014 Elsevier Interactive Patient Education  2017 [employer].

## 2020-10-25 ENCOUNTER — Ambulatory Visit: Payer: Medicare PPO | Admitting: Cardiovascular Disease

## 2020-12-18 ENCOUNTER — Other Ambulatory Visit: Payer: Self-pay | Admitting: Physician Assistant

## 2020-12-18 DIAGNOSIS — Z952 Presence of prosthetic heart valve: Secondary | ICD-10-CM

## 2020-12-20 ENCOUNTER — Telehealth: Payer: Self-pay | Admitting: Physician Assistant

## 2020-12-20 NOTE — Telephone Encounter (Signed)
New message    I called Brian [name] to schedule his 83yr follow up.  I had to leave a message on their answering machine.  Brian [name] left me a voice mail to confirm their appointment but she said they were under the impression that Brian [name] had been "cleared" and did not need any further appointments with [place].  They confirmed their appt but can you call them to explain how this works and why he needs another appointment.    Thanks

## 2020-12-20 NOTE — Telephone Encounter (Signed)
All taken care of -

## 2021-01-30 ENCOUNTER — Other Ambulatory Visit: Payer: Self-pay

## 2021-01-30 ENCOUNTER — Ambulatory Visit: Payer: Medicare PPO | Admitting: Physician Assistant

## 2021-01-30 ENCOUNTER — Encounter (INDEPENDENT_AMBULATORY_CARE_PROVIDER_SITE_OTHER): Payer: Self-pay

## 2021-01-30 ENCOUNTER — Ambulatory Visit (HOSPITAL_COMMUNITY): Payer: Medicare PPO | Attending: Cardiovascular Disease

## 2021-01-30 ENCOUNTER — Encounter: Payer: Self-pay | Admitting: Physician Assistant

## 2021-01-30 VITALS — BP 148/78 | HR 55 | Ht 70.0 in | Wt 137.0 lb

## 2021-01-30 DIAGNOSIS — Z952 Presence of prosthetic heart valve: Secondary | ICD-10-CM | POA: Diagnosis not present

## 2021-01-30 DIAGNOSIS — I10 Essential (primary) hypertension: Secondary | ICD-10-CM | POA: Diagnosis not present

## 2021-01-30 DIAGNOSIS — G3109 Other frontotemporal dementia: Secondary | ICD-10-CM | POA: Diagnosis not present

## 2021-01-30 DIAGNOSIS — R634 Abnormal weight loss: Secondary | ICD-10-CM | POA: Diagnosis not present

## 2021-01-30 DIAGNOSIS — F0280 Dementia in other diseases classified elsewhere without behavioral disturbance: Secondary | ICD-10-CM

## 2021-01-30 LAB — ECHOCARDIOGRAM COMPLETE
AV Mean grad: 9.4 mmHg
AV Peak grad: 15.6 mmHg
Ao pk vel: 1.97 m/s
Area-P 1/2: 2.83 cm2
P 1/2 time: 419 msec
S' Lateral: 2.8 cm

## 2021-01-30 NOTE — Progress Notes (Signed)
[facility]                                       Cardiology Office Note    Date:  01/31/2021   ID:  [name], DOB April 19, 1934, MRN [id number]  PCP:  Delsa Grana, PA-C  Cardiologist:  Dr. Oval Linsey / Dr. Angelena Form & Dr. Roxy Manns (TAVR)  CC: 1 year s/p TAVR  History of Present Illness:  Brian [name] is a 87 y.o. male with a history of HTN, chronic diastolic CHF, HLD, stage III CKD, IDDM, chronic anemia, dementia and severe AS s/p TAVR (01/31/20) who presents to clinic for follow up    Patient has been followed for several years by Dr. Oval Linsey with known history of aortic stenosis and hypertension.  Previous echocardiograms have documented the presence of normal left ventricular systolic function with aortic stenosis that has slowly progressed in severity.  Last year, the pt developed increased tendency to get fatigued with decreased exercise tolerance and weight loss. Follow-up echocardiogram revealed significant progression of aortic stenosis with peak velocity across aortic valve measured greater than 3.3 m/s corresponding to mean transvalvular gradient estimated 37 mmHg.  DVI was reported 0.32.  Left ventricular systolic function remain normal. Cardiac catheterization revealed mild to moderate nonobstructive coronary artery disease.  Mean transvalvular gradient across aortic valve was reported 25 mmHg.  Right heart pressures were normal.   He was evaluated by the multidisciplinary valve team and underwemt successful TAVR with a 26 mm Edwards Sapien 3 Ultra THV via the TF approach on 01/31/20. Post operative echo showed EF 60%, normally functioning TAVR with a mean gradient of 7 mm Hg and no PVL. His hospital course was c/b acute delirium and a passing out episode. He was discharged on aspirin alone. He has done well in follow up but reported significant weight loss. 1 month echo showed F 60%, normally functioning TAVR with a mean gradient  of 4.5 mmHg and no PVL.  Today he presents to clinic for follow up. Here with wife. Feeling great working out in the yard and staying very active. Still has some issues with mild memory issues. No CP or SOB. No LE edema, orthopnea or PND. No dizziness or syncope. No blood in stool or urine. No palpitations.   Past Medical History:  Diagnosis Date   Anemia of chronic renal failure 05/08/2015   Aortic stenosis, severe 09/21/2015   Arthritis    Bladder stone    Chronic kidney disease (CKD), stage III (moderate) ([place]) 05/08/2015   Followed by Dr. 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 Mikey College and Roxy Manns   Type 2 diabetes mellitus ([place])     Past Surgical History:  Procedure Laterality Date   COLONOSCOPY     CYSTOSCOPY/RETROGRADE/URETEROSCOPY/STONE EXTRACTION WITH BASKET N/A 03/22/2013   Procedure: CYSTOSCOPY BLADDER STONE STONE EXTRACTION;  Surgeon: [name], MD;  Location: [facility];  Service: Urology;  Laterality: N/A;  CYSTOSCOPY     INTRAOPERATIVE TRANSTHORACIC ECHOCARDIOGRAM N/A 01/31/2020   Procedure: INTRAOPERATIVE TRANSTHORACIC ECHOCARDIOGRAM;  Surgeon: Burnell Blanks, MD;  Location: [facility];  Service: Open Heart Surgery;  Laterality: N/A;   RIGHT/LEFT HEART CATH AND CORONARY ANGIOGRAPHY N/A 01/12/2020   Procedure: RIGHT/LEFT HEART CATH AND CORONARY ANGIOGRAPHY;  Surgeon: Burnell Blanks, MD;  Location: [facility];  Service: Cardiovascular;  Laterality: N/A;   TONSILLECTOMY     TRANSCATHETER AORTIC VALVE REPLACEMENT, TRANSFEMORAL N/A 01/31/2020    Procedure: TRANSCATHETER AORTIC VALVE REPLACEMENT, TRANSFEMORAL;  Surgeon: Burnell Blanks, MD;  Location: [facility];  Service: Open Heart Surgery;  Laterality: N/A;    Current Medications: Outpatient Medications Prior to Visit  Medication Sig Dispense Refill   ACCU-CHEK GUIDE test strip USE UP TO 4 TIMES A DAY AS DIRECTED 100 strip 0   Accu-Chek Softclix Lancets lancets      aspirin EC 81 MG tablet Take 81 mg by mouth daily.      docusate sodium (DOK) 100 MG capsule Take 1 capsule (100 mg total) by mouth 2 (two) times daily as needed. 100 capsule 5   finasteride (PROSCAR) 5 MG tablet Take 1 tablet (5 mg total) by mouth daily. 90 tablet 3   hydroxychloroquine (PLAQUENIL) 200 MG tablet Take 1 tablet by mouth daily.     Insulin Detemir (LEVEMIR FLEXTOUCH) 100 UNIT/ML Pen Inject 10-20 Units into the skin daily. 15 mL 1   Insulin Pen Needle (B-D UF III MINI PEN NEEDLES) 31G X 5 MM MISC USE AS DIRECTED WITH LEVEMIR INSULIN PENS AT BEDTIME, once a day 100 each 3   Omega-3 Fatty Acids (FISH OIL) 1000 MG CAPS Take 1 capsule (1,000 mg total) by mouth daily. 90 capsule 3   tamsulosin (FLOMAX) 0.4 MG CAPS capsule TAKE 1 CAPSULE BY MOUTH EVERY DAY 30 capsule 5   quinapril (ACCUPRIL) 20 MG tablet TAKE 1 TABLET BY MOUTH TWICE A DAY 180 tablet 3   rosuvastatin (CRESTOR) 5 MG tablet TAKE ONE TABLET BY MOUTH FOUR NIGHTS A WEEK 50 tablet 0   No facility-administered medications prior to visit.     Allergies:   Carvedilol, Niaspan [niacin er], Shellfish allergy, Statins, Sulfa antibiotics, Zetia [ezetimibe], and Januvia [sitagliptin]   Social History   Socioeconomic History   Marital status: Married    Spouse name: [name]   Number of children: 4   Years of education: some college   Highest education level: 12th grade  Occupational History   Occupation: Engineer, manufacturing systems  Tobacco Use   Smoking status: Former    Packs/day: 0.75    Years: 39.00    Pack years: 29.25    Types: Cigarettes     Quit date: 03/21/1993    Years since quitting: 27.8   Smokeless tobacco: Never   Tobacco comments:    smoking cessation materials not required  Vaping Use   Vaping Use: Never used  Substance and Sexual Activity   Alcohol use: No    Alcohol/week: 0.0 standard drinks   Drug use: No   Sexual activity: Yes  Other Topics Concern   Not on file  Social History Narrative   Lives with wife in [place], [place].   Social Determinants of Health   Financial Resource Strain: Low Risk    Difficulty of Paying Living Expenses: Not very hard  Food Insecurity: No Food Insecurity   Worried About Running Out of Food in the Last Year: Never  true   Ran Out of Food in the Last Year: Never true  Transportation Needs: No Transportation Needs   Lack of Transportation (Medical): No   Lack of Transportation (Non-Medical): No  Physical Activity: Inactive   Days of Exercise per Week: 0 days   Minutes of Exercise per Session: 0 min  Stress: No Stress Concern Present   Feeling of Stress : Not at all  Social Connections: Moderately Integrated   Frequency of Communication with Friends and Family: More than three times a week   Frequency of Social Gatherings with Friends and Family: More than three times a week   Attends Religious Services: More than 4 times per year   Active Member of [employer] or Organizations: No   Attends Music therapist: Never   Marital Status: Married     Family History:  The patient's family history includes Congestive Heart Failure in an other family member; Diabetes in his brother; Heart attack in an other family member; Heart disease in an other family member; Hyperlipidemia in his father.     ROS:   Please see the history of present illness.    ROS All other systems reviewed and are negative.   PHYSICAL EXAM:   VS:  BP (!) 148/78   Pulse (!) 55   Ht 5\' 10"  (1.778 m)   Wt 137 lb (62.1 kg)   SpO2 99%   BMI 19.66 kg/m    GEN: Well nourished, well developed, in no  acute distress HEENT: normal Neck: no JVD or masses Cardiac: RRR; no murmurs, rubs, or gallops,no edema  Respiratory:  clear to auscultation bilaterally, normal work of breathing GI: soft, nontender, nondistended, + BS MS: no deformity or atrophy Skin: warm and dry, no rash.  Neuro:  Alert and Oriented x 3, Strength and sensation are intact Psych: euthymic mood, full affect   Wt Readings from Last 3 Encounters:  01/30/21 137 lb (62.1 kg)  04/05/20 137 lb (62.1 kg)  02/08/20 134 lb (60.8 kg)      Studies/Labs Reviewed:   EKG:  EKG is NOT ordered today.  Recent Labs: No results found for requested labs within last 8760 hours.   Lipid Panel    Component Value Date/Time   CHOL 174 11/21/2019 0904   TRIG 77 11/21/2019 0904   HDL 51 11/21/2019 0904   CHOLHDL 3.4 11/21/2019 0904   VLDL 17 02/27/2017 0844   LDLCALC 106 (H) 11/21/2019 0904    Additional studies/ records that were reviewed today include:  TAVR OPERATIVE NOTE     Date of Procedure:                01/31/2020   Preoperative Diagnosis:      Severe Aortic Stenosis    Postoperative Diagnosis:    Same    Procedure:        Transcatheter Aortic Valve Replacement - Percutaneous Right Transfemoral Approach             Edwards Sapien 3 Ultra THV (size 26 mm, model # 9750TFX, serial # [id number])              Co-Surgeons:                        Valentina Gu. Roxy Manns, MD and Lauree Chandler, MD   Anesthesiologist:                  Arabella Merles, MD   Echocardiographer:  Jenkins Rouge, MD   Pre-operative Echo Findings: Severe aortic stenosis Normal left ventricular systolic function   Post-operative Echo Findings: No paravalvular leak Normal left ventricular systolic function   _____________   Echo 01/31/20: IMPRESSIONS  1. Left ventricular ejection fraction, by estimation, is 60 to 65%. The left ventricle has normal function. The left ventricle has no regional wall motion abnormalities.   2.  Right ventricular systolic function is normal. The right ventricular size is normal.   3. The mitral valve is normal in structure. Trivial mitral valve regurgitation. No evidence of mitral stenosis.   4. Post TAVR with 26 mm Sapien 3 valve No PVL peak velocity 1,9 m/sec peak velocity 14 mmHg mean 7 mmHg AVA 3.1 cm2 DVI 0.63 . The aortic valve has been repaired/replaced. Aortic valve regurgitation is not visualized. No aortic stenosis is present.   5. The inferior vena cava is normal in size with greater than 50%  respiratory variability, suggesting right atrial pressure of 3 mmHg.   _______________  Echo 04/05/20 IMPRESSIONS   1. Left ventricular ejection fraction, by estimation, is 60 to 65%. The  left ventricle has normal function. The left ventricle has no regional  wall motion abnormalities. There is mild concentric left ventricular  hypertrophy. Left ventricular diastolic  parameters are indeterminate.   2. Right ventricular systolic function is normal. The right ventricular  size is mildly enlarged. Tricuspid regurgitation signal is inadequate for  assessing PA pressure.   3. The mitral valve is normal in structure. Trivial mitral valve  regurgitation. No evidence of mitral stenosis.   4. The aortic valve has been repaired/replaced. Aortic valve  regurgitation is trivial. There is a 26 mm Edwards Sapien prosthetic  (TAVR) valve present in the aortic position. Procedure Date: 01/31/2020.  Echo findings are consistent with normal structure  and function of the aortic valve prosthesis. Aortic valve area, by VTI  measures 2.62 cm. Aortic valve mean gradient measures 4.5 mmHg. Aortic  valve Vmax measures 1.50 m/s.   5. The inferior vena cava is normal in size with <50% respiratory  variability, suggesting right atrial pressure of 8 mmHg.  Comparison(s): Prior images reviewed side by side.  Conclusion(s)/Recommendation(s): Stable post-TAVR. No significant change  from prior study.    ___________________  Echo 01/30/21 IMPRESSIONS   1. Left ventricular ejection fraction, by estimation, is 70 to 75%. The  left ventricle has hyperdynamic function. The left ventricle has no  regional wall motion abnormalities. There is moderate concentric left  ventricular hypertrophy. Left ventricular  diastolic parameters are consistent with Grade I diastolic dysfunction  (impaired relaxation).   2. Right ventricular systolic function is normal. The right ventricular  size is normal. Mildly increased right ventricular wall thickness.  Tricuspid regurgitation signal is inadequate for assessing PA pressure.   3. Left atrial size was mildly dilated.   4. The mitral valve is normal in structure. No evidence of mitral valve  regurgitation.   5. The aortic valve has been repaired/replaced. Aortic valve  regurgitation is mild. There is a valve present in the aortic position.  Echo findings are consistent with perivalvular leak of the aortic  prosthesis. Aortic valve mean gradient measures 9.4  mmHg. Aortic valve Vmax measures 1.97 m/s. Aortic valve acceleration time  measures 60 msec.   6. Aortic dilatation noted. There is mild dilatation of the aortic root,  measuring 39 mm.   7. The inferior vena cava is normal in size with greater than 50%  respiratory variability, suggesting  right atrial pressure of 3 mmHg.   Comparison(s): No significant change from prior study. Prior images  reviewed side by side  ASSESSMENT & PLAN:   Severe AS s/p TAVR: Echo today shows EF 75%, normally functioning TAVR with a mean gradient of 9.4 mmHg and mild PVL. He has NYHA class I symptoms. Continue on aspirin alone. SBE prophylaxis discussed; the patient is edentulous and does not go to the dentist. Continue regular follow up with Dr. Oval Linsey.    HTN: BP mildly elevated today but wife says it is well controlled at home.   Dementia: mild.   Weight loss: this has resolved and now eating quite well.     Medication Adjustments/Labs and Tests Ordered: Current medicines are reviewed at length with the patient today.  Concerns regarding medicines are outlined above.  Medication changes, Labs and Tests ordered today are listed in the Patient Instructions below. Patient Instructions  Medication Instructions:  No changes *If you need a refill on your cardiac medications before your next appointment, please call your pharmacy*   Lab Work: none  Testing/Procedures: none  Follow-Up: As needed Follow up with Dr. Oval Linsey in June 2023    Signed, Angelena Form, PA-C  01/31/2021 10:31 AM    [address], [place], [place]  [id number] Phone: [phone]; Fax: [phone]

## 2021-01-30 NOTE — Patient Instructions (Addendum)
Medication Instructions:  No changes *If you need a refill on your cardiac medications before your next appointment, please call your pharmacy*   Lab Work: none  Testing/Procedures: none  Follow-Up: As needed Follow up with Dr. Oval Linsey in June 2023

## 2021-07-30 NOTE — Progress Notes (Signed)
Cardiology Office Note   Date:  07/31/2021   ID:  Brian [name], DOB February 02, 1934, MRN [id number]  PCP:  Sheela Stack, MD  Cardiologist:   Skeet Latch, MD   No chief complaint on file.   History of Present Illness: Brian [name] is a 87 y.o. male with severe aortic stenosis s/p TAVR 01/2020, hypertension, diabetes mellitus, CKD 3, and hyperlipidemia who presents for follow up.  Brian [name] was admitted to the hospital 06/2015 with liver abscesses and sepsis. During that hospitalization he had demand ischemia with a peak troponin of 1.08.  EKG was negative for ischemia and his echo revealed normal systolic function, grade 1 diastolic dysfunction and mild aortic stenosis.  He followed up with cardiology following that hospitalization and was doing well.  He underwent exercise Cardiolite on 09/2015 that was negative for ischemia. He has been intolerant to statins in the past and was tried on pravastatin. At a follow-up appointment with his primary care physician who is noted to be bradycardic to 45 bpm. Carvedilol was held and he remained asymptomatic.  Since his last appointment he was noted to be in ventricular bigeminy at a PCP appointment.  He was referred to EP given his history of bradycardia on beta blockers.  A 48 hour Holter 08/2018 showed occasional PAC/PVCs but no atrial fibrillation.   Since his last appointment he had a repeat echo that showed an increase in his aortic valve gradient.  He has severe aortic stenosis. He was doing well. Echo 01/2021 revealed LVEF 70 to 75% and grade 1 diastolic dysfunction. He had a 57mm Edwards Sapien 3 TAVR implanted 01/2020. He had worsening confusion while in the hospital. He followed-up with the [facility] most recently 01/2021 and was doing well.  Today, he is accompanied by a family member and is doing well. He stays active by doing yard work like raking leaves. He reports getting pain in his bilateral feet. He is taking prednisone  to alleviate the pain. He follows with Dr. Radford Pax for his blood pressure. His family member reports his blood pressure can go as high as 199/100. He takes lisinopril provided by [place]. He mentions he was part of the [employer] which allowed him to travel. He denies any palpitations, chest pain, or shortness of breath, lightheadedness, headaches, syncope, orthopnea, PND, lower extremity edema or exertional symptoms.  Past Medical History:  Diagnosis Date   Anemia of chronic renal failure 05/08/2015   Aortic stenosis, severe 09/21/2015   Arthritis    Bladder stone    Chronic kidney disease (CKD), stage III (moderate) ([place]) 05/08/2015   Followed by Dr. 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 Mikey College and Roxy Manns   Type 2 diabetes mellitus ([place])     Past Surgical History:  Procedure Laterality Date   COLONOSCOPY     CYSTOSCOPY/RETROGRADE/URETEROSCOPY/STONE EXTRACTION WITH BASKET N/A 03/22/2013   Procedure: CYSTOSCOPY BLADDER STONE STONE EXTRACTION;  Surgeon: [name], MD;  Location: [facility];  Service: Urology;  Laterality: N/A;  CYSTOSCOPY  INTRAOPERATIVE TRANSTHORACIC ECHOCARDIOGRAM N/A 01/31/2020   Procedure: INTRAOPERATIVE TRANSTHORACIC ECHOCARDIOGRAM;  Surgeon: Burnell Blanks, MD;  Location: [facility];  Service: Open Heart Surgery;  Laterality: N/A;   RIGHT/LEFT HEART CATH AND CORONARY ANGIOGRAPHY N/A 01/12/2020   Procedure: RIGHT/LEFT HEART CATH AND CORONARY ANGIOGRAPHY;  Surgeon: Burnell Blanks, MD;  Location: [facility];  Service: Cardiovascular;  Laterality: N/A;    TONSILLECTOMY     TRANSCATHETER AORTIC VALVE REPLACEMENT, TRANSFEMORAL N/A 01/31/2020   Procedure: TRANSCATHETER AORTIC VALVE REPLACEMENT, TRANSFEMORAL;  Surgeon: Burnell Blanks, MD;  Location: [facility];  Service: Open Heart Surgery;  Laterality: N/A;     Current Outpatient Medications  Medication Sig Dispense Refill   ACCU-CHEK GUIDE test strip USE UP TO 4 TIMES A DAY AS DIRECTED 100 strip 0   Accu-Chek Softclix Lancets lancets      aspirin EC 81 MG tablet Take 81 mg by mouth daily.      docusate sodium (DOK) 100 MG capsule Take 1 capsule (100 mg total) by mouth 2 (two) times daily as needed. 100 capsule 5   finasteride (PROSCAR) 5 MG tablet Take 1 tablet (5 mg total) by mouth daily. 90 tablet 3   folic acid (FOLVITE) 1 MG tablet Take 1 tablet by mouth daily.     hydroxychloroquine (PLAQUENIL) 200 MG tablet Take 1 tablet by mouth daily.     Insulin Detemir (LEVEMIR FLEXTOUCH) 100 UNIT/ML Pen Inject 10-20 Units into the skin daily. 15 mL 1   Insulin Pen Needle (B-D UF III MINI PEN NEEDLES) 31G X 5 MM MISC USE AS DIRECTED WITH LEVEMIR INSULIN PENS AT BEDTIME, once a day 100 each 3   lisinopril (ZESTRIL) 40 MG tablet TAKE ONE TABLET BY MOUTH DAILY FOR BLOOD PRESSURE     Omega-3 Fatty Acids (FISH OIL) 1000 MG CAPS Take 1 capsule (1,000 mg total) by mouth daily. 90 capsule 3   predniSONE (DELTASONE) 5 MG tablet Take 1 tablet by mouth daily.     tamsulosin (FLOMAX) 0.4 MG CAPS capsule TAKE 1 CAPSULE BY MOUTH EVERY DAY 30 capsule 5   No current facility-administered medications for this visit.    Allergies:   Carvedilol, Niaspan [niacin er], Shellfish allergy, Statins, Sulfa antibiotics, Zetia [ezetimibe], and Januvia [sitagliptin]    Social History:  The patient  reports that he quit smoking about 28 years ago. His smoking use included cigarettes. He has a 29.25 pack-year smoking history. He has never used smokeless tobacco. He reports that he does not drink alcohol and does not use  drugs.   Family History:  The patient's family history includes Congestive Heart Failure in an other family member; Diabetes in his brother; Heart attack in an other family member; Heart disease in an other family member; Hyperlipidemia in his father.    ROS:  Please see the history of present illness. (+) Bilateral feet pain All other systems are reviewed and negative.   PHYSICAL EXAM: VS:  BP (!) 144/62 (BP Location: Left Arm, Patient Position: Sitting, Cuff Size: Normal)   Pulse 93   Ht 5\' 10"  (1.778 m)   Wt 152 lb 12.8 oz (69.3 kg)   BMI 21.92 kg/m  , BMI Body mass index is 21.92 kg/m. GENERAL:  Well appearing HEENT: Pupils equal round and reactive, fundi not visualized, oral mucosa unremarkable NECK:  No jugular venous distention, waveform within normal limits, carotid upstroke brisk and symmetric, no bruits, no thyromegaly LUNGS:  Clear to auscultation bilaterally HEART:  RRR.  PMI not displaced or sustained,S1 and S2 within normal limits, no S3, no S4, no clicks, no rubs, II/VI systolic murmur ABD:  Flat, positive bowel sounds normal in frequency in pitch, no bruits, no rebound, no guarding, no midline pulsatile mass, no hepatomegaly, no splenomegaly EXT:  2 plus pulses throughout, no edema, no cyanosis no clubbing SKIN:  No rashes no nodules NEURO:  Cranial nerves II through XII grossly intact, motor grossly intact throughout PSYCH:  Cognitively intact, oriented to person place and time  EKG:   07/31/21: Sinus rhythm, rate 93 bpm 10/31/19: Sinus rhythm.  Rate 86 bpm. 12/04/17: Sinus rhythm.  Rate 82 bmp.    Echo 01/30/21  1. Left ventricular ejection fraction, by estimation, is 70 to 75%. The  left ventricle has hyperdynamic function. The left ventricle has no  regional wall motion abnormalities. There is moderate concentric left  ventricular hypertrophy. Left ventricular diastolic parameters are consistent with Grade I diastolic dysfunction (impaired relaxation).   2. Right  ventricular systolic function is normal. The right ventricular  size is normal. Mildly increased right ventricular wall thickness.  Tricuspid regurgitation signal is inadequate for assessing PA pressure.   3. Left atrial size was mildly dilated.   4. The mitral valve is normal in structure. No evidence of mitral valve  regurgitation.   5. The aortic valve has been repaired/replaced. Aortic valve  regurgitation is mild. There is a valve present in the aortic position.  Echo findings are consistent with perivalvular leak of the aortic  prosthesis. Aortic valve mean gradient measures 9.4 mmHg. Aortic valve Vmax measures 1.97 m/s. Aortic valve acceleration time measures 60 msec.   6. Aortic dilatation noted. There is mild dilatation of the aortic root,  measuring 39 mm.   7. The inferior vena cava is normal in size with greater than 50%  respiratory variability, suggesting right atrial pressure of 3 mmHg.  Comparison(s): No significant change from prior study. Prior images  reviewed side by side.  Echo 09/29/19: IMPRESSIONS   1. Left ventricular ejection fraction, by visual estimation, is 65 to  70%. The left ventricle has hyperdynamic function. There is moderately  increased left ventricular hypertrophy.   2. Elevated left atrial pressure.   3. Left ventricular diastolic parameters are consistent with Grade I  diastolic dysfunction (impaired relaxation).   4. The left ventricle has no regional wall motion abnormalities.   5. Global right ventricle has normal systolic function.The right  ventricular size is normal. No increase in right ventricular wall  thickness.   6. Left atrial size was normal.   7. Right atrial size was normal.   8. The mitral valve is normal in structure. Mild mitral valve  regurgitation. No evidence of mitral stenosis.   9. The tricuspid valve is normal in structure.  10. The tricuspid valve is normal in structure. Tricuspid valve  regurgitation is mild.  11.  Aortic valve mean gradient measures 37.0 mmHg.  12. The aortic valve is normal in structure. Aortic valve regurgitation is  not visualized. Severe aortic valve stenosis.  13. There is severe calcifcation of the aortic valve.  14. There is severe thickening of the aortic valve.  15. The pulmonic valve was normal in structure. Pulmonic valve  regurgitation is not visualized.  16. Normal pulmonary artery systolic pressure.  17. The tricuspid regurgitant velocity is 2.33 m/s, and with an assumed  right atrial pressure of 3 mmHg, the estimated right ventricular systolic  pressure is normal at 24.7 mmHg.  18. The inferior vena cava is normal in size with greater than 50%  respiratory variability, suggesting right atrial pressure of 3 mmHg.  19. Severe aortic stenosis, peak/mean transaortic gradients 58/37 mmHg,  dimensionless index 0.26.   48 hour Holter 09/16/18: Minimum HR: 51 BPM at 6:27:33 AM Maximum HR: 120 BPM at 11:59:29 PM Average HR: 73 BPM Rare PVCs and PACs Zero atrial fibrillation One episode of ventricular bigeminy No other arrhythmias noted No symptoms submitted  Exercise Cardiolite 09/28/15: Nuclear stress EF: 51%. The left ventricular ejection fraction is mildly decreased (45-54%). There was no ST segment deviation noted during stress. The study is normal. Normal stress nuclear study with no ischemia or infarction; low normal LV function with EF 51 and normal wall motion.   Recent Labs: No results found for requested labs within last 8760 hours.    Lipid Panel    Component Value Date/Time   CHOL 174 11/21/2019 0904   TRIG 77 11/21/2019 0904   HDL 51 11/21/2019 0904   CHOLHDL 3.4 11/21/2019 0904   VLDL 17 02/27/2017 0844   LDLCALC 106 (H) 11/21/2019 0904      Wt Readings from Last 3 Encounters:  07/31/21 152 lb 12.8 oz (69.3 kg)  01/30/21 137 lb (62.1 kg)  04/05/20 137 lb (62.1 kg)      ASSESSMENT AND PLAN: Essential hypertension Blood pressure remains  elevated.  His PCP recently started him on lisinopril and it has improved but is still above his goal of less than 130/80.  He reported that his blood pressure dropped drastically on amlodipine 10 mg in the past.  Would recommend adding 2.5 mg.  Given that he is taking care of in the [place] and has an appointment coming soon we will not add it.  He gets his medications free through them.  S/P TAVR (transcatheter aortic valve replacement) He is doing well.  His 23 mm Edwards SAPIEN 3 TAVR valve is functioning properly.  The mean gradient was 9 mmHg on his echo 01/2021.  He is euvolemic.  Chronic kidney disease (CKD), stage III (moderate) (HCC) Renal function is stable.  His creatinine was 1.3 on 05/2021.  Continue lisinopril.    Current medicines are reviewed at length with the patient today.  The patient does not have concerns regarding medicines.  The following changes have been made: none Labs/ tests ordered today include:   Orders Placed This Encounter  Procedures   EKG 12-Lead     Disposition:   FU with Marchia Diguglielmo C. Oval Linsey, MD, [facility] in 1 year  I,Mykaella Javier,acting as a scribe for Skeet Latch, MD.,have documented all relevant documentation on the behalf of Skeet Latch, MD,as directed by  Skeet Latch, MD while in the presence of Skeet Latch, MD.  I, [place] Oval Linsey, MD have reviewed all documentation for this visit.  The documentation of the exam, diagnosis, procedures, and orders on 07/31/2021 are all accurate and complete.   Signed, Aizlyn Schifano C. Oval Linsey, MD, [facility]  07/31/2021 4:25 PM    [place]

## 2021-07-31 ENCOUNTER — Ambulatory Visit (HOSPITAL_BASED_OUTPATIENT_CLINIC_OR_DEPARTMENT_OTHER): Payer: Medicare PPO | Admitting: Cardiovascular Disease

## 2021-07-31 ENCOUNTER — Other Ambulatory Visit: Payer: Self-pay

## 2021-07-31 ENCOUNTER — Encounter (HOSPITAL_BASED_OUTPATIENT_CLINIC_OR_DEPARTMENT_OTHER): Payer: Self-pay | Admitting: Cardiovascular Disease

## 2021-07-31 VITALS — BP 144/62 | HR 93 | Ht 70.0 in | Wt 152.8 lb

## 2021-07-31 DIAGNOSIS — Z952 Presence of prosthetic heart valve: Secondary | ICD-10-CM | POA: Diagnosis not present

## 2021-07-31 DIAGNOSIS — N1831 Chronic kidney disease, stage 3a: Secondary | ICD-10-CM

## 2021-07-31 DIAGNOSIS — I10 Essential (primary) hypertension: Secondary | ICD-10-CM

## 2021-07-31 NOTE — Assessment & Plan Note (Signed)
Renal function is stable.  His creatinine was 1.3 on 05/2021.  Continue lisinopril.

## 2021-07-31 NOTE — Assessment & Plan Note (Signed)
He is doing well.  His 23 mm Edwards SAPIEN 3 TAVR valve is functioning properly.  The mean gradient was 9 mmHg on his echo 01/2021.  He is euvolemic.

## 2021-07-31 NOTE — Patient Instructions (Addendum)
Medication Instructions:  Your physician recommends that you continue on your current medications as directed. Please refer to the Current Medication list given to you today.   *If you need a refill on your cardiac medications before your next appointment, please call your pharmacy*  Lab Work: NONE   Testing/Procedures: NONE   Follow-Up: At [facility], you and your health needs are our priority.  As part of our continuing mission to provide you with exceptional heart care, we have created designated Provider Care Teams.  These Care Teams include your primary Cardiologist (physician) and Advanced Practice Providers (APPs -  Physician Assistants and Nurse Practitioners) who all work together to provide you with the care you need, when you need it.  We recommend signing up for the patient portal called "MyChart".  Sign up information is provided on this After Visit Summary.  MyChart is used to connect with patients for Virtual Visits (Telemedicine).  Patients are able to view lab/test results, encounter notes, upcoming appointments, etc.  Non-urgent messages can be sent to your provider as well.   To learn more about what you can do with MyChart, go to [email or web address].    Your next appointment:   12 month(s)  The format for your next appointment:   In Person  Provider:   Skeet Latch, MD   Other Instructions  Consider adding amlodipine 2.5mg  daily if your blood pressure remains elevated.

## 2021-07-31 NOTE — Assessment & Plan Note (Signed)
Blood pressure remains elevated.  His PCP recently started him on lisinopril and it has improved but is still above his goal of less than 130/80.  He reported that his blood pressure dropped drastically on amlodipine 10 mg in the past.  Would recommend adding 2.5 mg.  Given that he is taking care of in the [place] and has an appointment coming soon we will not add it.  He gets his medications free through them.

## 2021-10-28 IMAGING — CR DG SHOULDER 2+V*L*
1 series · 3 of 3 positions shown · non-contrast
Comparison: None.

CLINICAL DATA: Left shoulder pain. No reported injury.

EXAM:
LEFT SHOULDER - 2+ VIEW

[Series 1: dg shoulder left · 0.14mm/px · 3 of 3 slices shown]
[im 1/3]
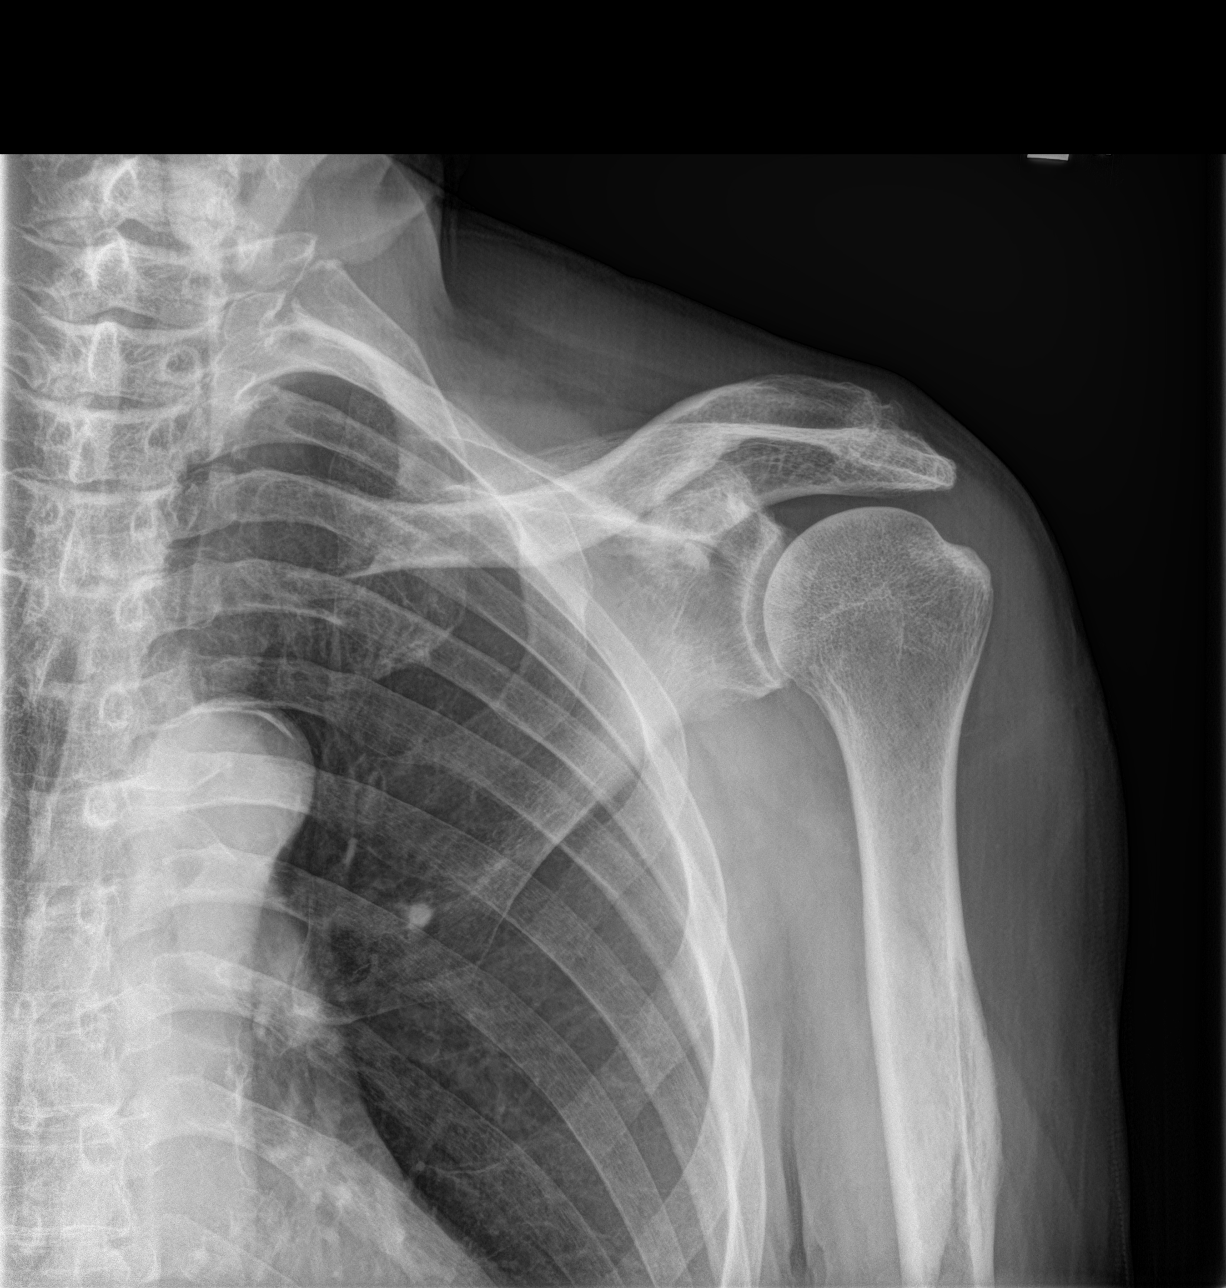
[im 2/3]
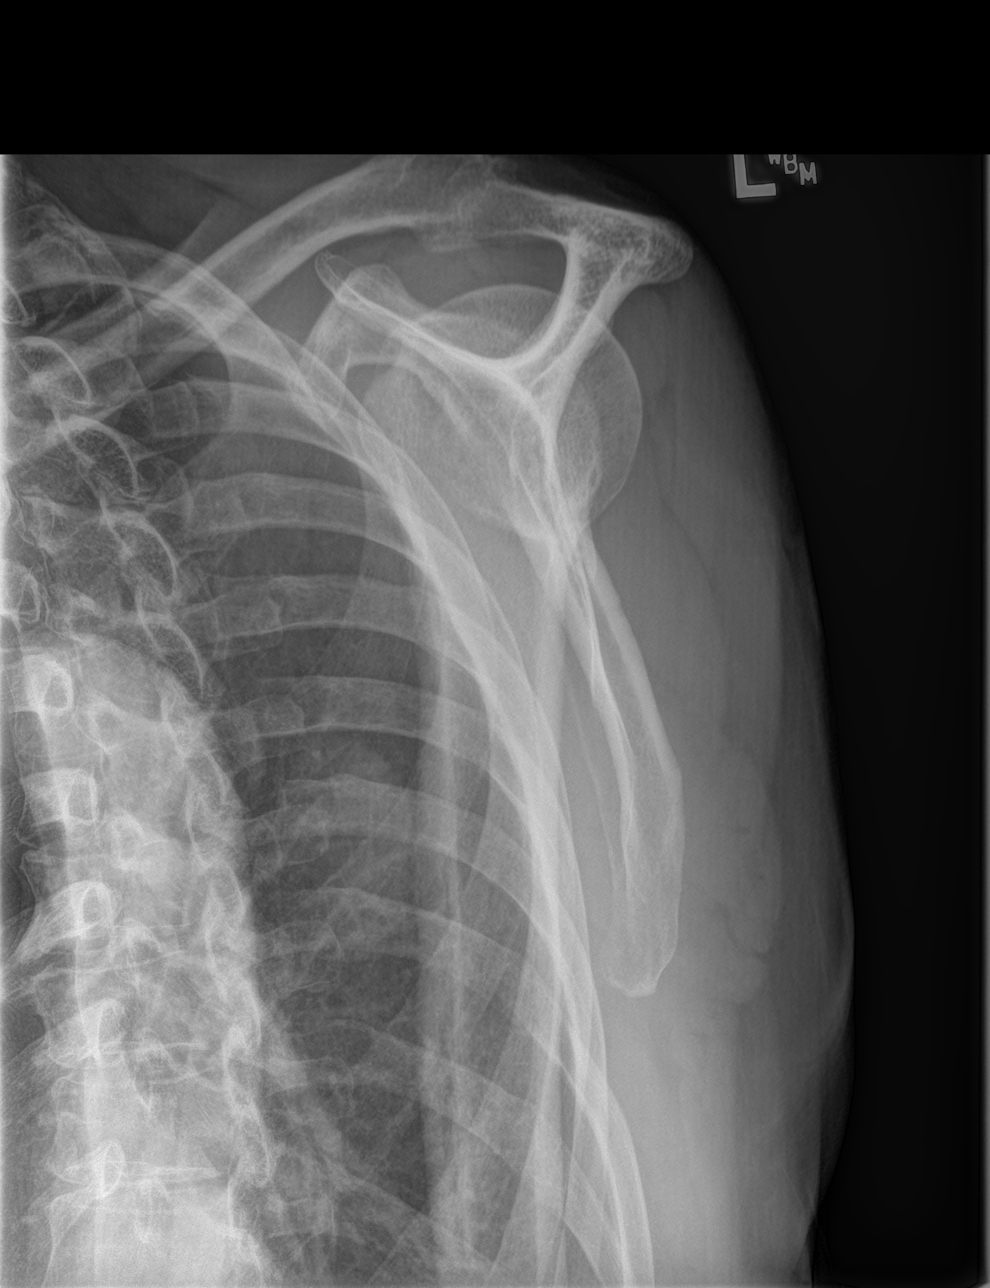
[im 3/3]
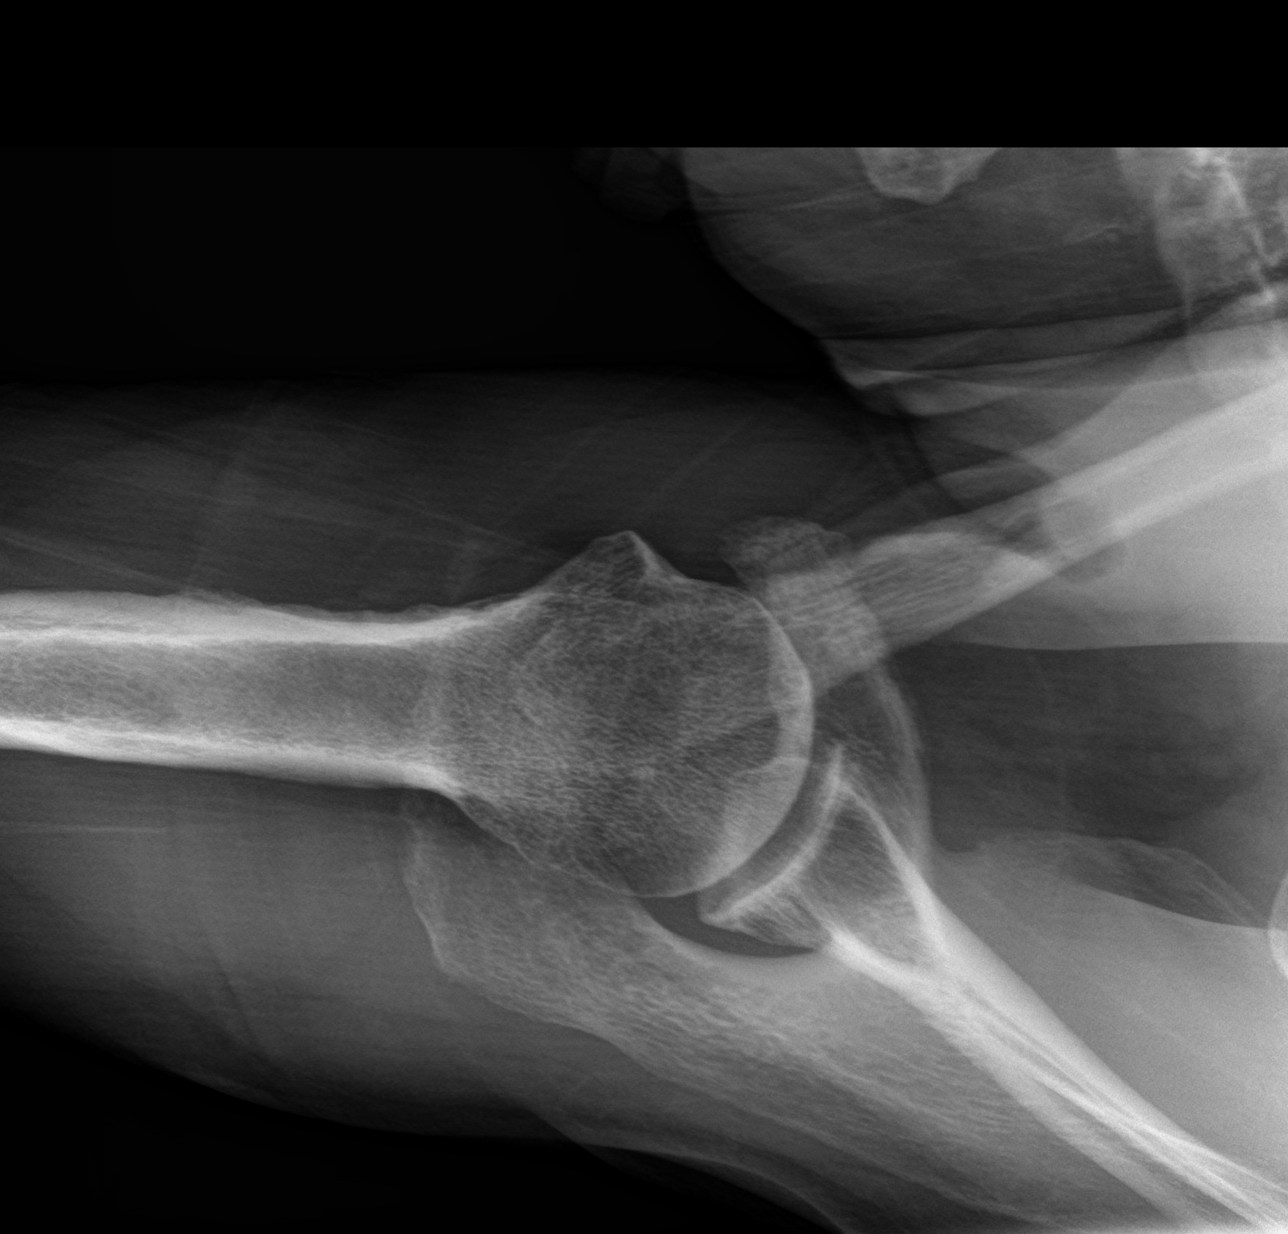

[3 of 3 positions shown; findings below may reference images not displayed]

FINDINGS: Mild to moderate acromioclavicular joint degenerative spur
formation. Otherwise, normal appearing bones and soft tissues.
Aortic calcifications are noted.
IMPRESSION: 1. Mild to moderate acromioclavicular joint degenerative changes.
2. Aortic atherosclerosis.

## 2022-09-19 ENCOUNTER — Ambulatory Visit (HOSPITAL_BASED_OUTPATIENT_CLINIC_OR_DEPARTMENT_OTHER): Payer: Medicare PPO | Admitting: Cardiovascular Disease

## 2022-09-19 ENCOUNTER — Encounter (HOSPITAL_BASED_OUTPATIENT_CLINIC_OR_DEPARTMENT_OTHER): Payer: Self-pay | Admitting: Cardiovascular Disease

## 2022-09-19 VITALS — BP 152/54 | HR 56 | Ht 70.0 in | Wt 147.3 lb

## 2022-09-19 DIAGNOSIS — I10 Essential (primary) hypertension: Secondary | ICD-10-CM | POA: Diagnosis not present

## 2022-09-19 NOTE — Patient Instructions (Addendum)
Medication Instructions:  Your Physician recommend you continue on your current medication as directed.    *If you need a refill on your cardiac medications before your next appointment, please call your pharmacy*  Follow-Up: At [facility], you and your health needs are our priority.  As part of our continuing mission to provide you with exceptional heart care, we have created designated Provider Care Teams.  These Care Teams include your primary Cardiologist (physician) and Advanced Practice Providers (APPs -  Physician Assistants and Nurse Practitioners) who all work together to provide you with the care you need, when you need it.  We recommend signing up for the patient portal called "MyChart".  Sign up information is provided on this After Visit Summary.  MyChart is used to connect with patients for Virtual Visits (Telemedicine).  Patients are able to view lab/test results, encounter notes, upcoming appointments, etc.  Non-urgent messages can be sent to your provider as well.   To learn more about what you can do with MyChart, go to [email or web address].    Your next appointment:   6-8 month(s)  Provider:   Skeet Latch, MD or Laurann Montana, NP    Other Instructions Please make sure that you get your cholesterol checked when you go to the [facility]  Since you have allergies to statins and Zetia, see if you are able to get Repatha or Praluent at the [place].  Please use the blood pressure logs provided today to check blood pressure two times per day for the next two weeks and then call those numbers into Dr. Blenda Mounts office at [phone].

## 2022-09-19 NOTE — Progress Notes (Signed)
Cardiology Office Note   Date:  09/19/2022   ID:  Brian [name], DOB [birth year], MRN [id number]  PCP:  Sheela Stack, MD  Cardiologist:   Skeet Latch, MD   No chief complaint on file.   History of Present Illness: Brian [name] is a 89 y.o. male with dementia, severe aortic stenosis s/p TAVR 01/2020, hypertension, diabetes mellitus, CKD 3, and hyperlipidemia who presents for follow up.  Brian [name] was admitted to the hospital 06/2015 with liver abscesses and sepsis. During that hospitalization he had demand ischemia with a peak troponin of 1.08.  EKG was negative for ischemia and his echo revealed normal systolic function, grade 1 diastolic dysfunction and mild aortic stenosis.  He followed up with cardiology following that hospitalization and was doing well.  He underwent exercise Cardiolite on 09/2015 that was negative for ischemia. He has been intolerant to statins in the past and was tried on pravastatin. At a follow-up appointment with his primary care physician who is noted to be bradycardic to 45 bpm. Carvedilol was held and he remained asymptomatic.  Since his last appointment he was noted to be in ventricular bigeminy at a PCP appointment.  He was referred to EP given his history of bradycardia on beta blockers.  A 48 hour Holter 08/2018 showed occasional PAC/PVCs but no atrial fibrillation.   Mr. Grounds had a repeat echo that showed an increase in his aortic valve gradient consistent with severe AS.  Echo 01/2021 revealed LVEF 70 to 75% and grade 1 diastolic dysfunction. He had a 17m Edwards Sapien 3 TAVR implanted 01/2020. He had worsening confusion while in the hospital.  At his last visit he is struggling with elevated blood pressures.  Home blood pressures were as high as the 190s over 100s.  [facility]  In the past his blood pressure dropped dramatically with 10 mg of amlodipine.  We recommended adding 2.5 mg.  [name] been  monitoring his blood pressure at home, noting lower readings around 120-130s/80, with no instances exceeding these figures. He has experienced occasional dizziness upon standing, which his spouse believes may be due to a potential drop in blood pressure. [name] has remained stable.  The patient [place] He has a documented intolerance to statins and an allergy to Zetia. [name] endured severe pain, which may be related to joint and muscle discomfort, while on these medications.  In terms of physical activity, [name] not particularly active but takes pleasure in yard work during the warmer months. His spouse is supportive of his walking more, taking advantage of the sidewalks in their neighborhood.  Brian [name] had episodes of dizziness or lightheadedness. His spouse is concerned that this might be a reaction to his finasteride or another prostate medication. He has been counseled to exercise caution when rising and to ensure adequate hydration.  His dementia is worsening and she gets frustrated with him repeatedly asking the same questions.    Past Medical History:  Diagnosis Date   Anemia of chronic renal failure 05/08/2015   Aortic stenosis, severe 09/21/2015   Arthritis    Bladder stone    Chronic kidney disease (CKD), stage III (moderate) [place] 05/08/2015   Followed by Dr. 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 Mikey College and Roxy Manns   Type 2 diabetes mellitus [facility])     Past Surgical History:   Procedure Laterality Date   COLONOSCOPY     CYSTOSCOPY/RETROGRADE/URETEROSCOPY/STONE EXTRACTION WITH BASKET N/A 03/22/2013   Procedure: CYSTOSCOPY BLADDER STONE STONE EXTRACTION;  Surgeon: [name], MD;  Location: [place];  Service: Urology;  Laterality: N/A;  CYSTOSCOPY     INTRAOPERATIVE TRANSTHORACIC ECHOCARDIOGRAM N/A 01/31/2020   Procedure: INTRAOPERATIVE TRANSTHORACIC ECHOCARDIOGRAM;  Surgeon: Burnell Blanks, MD;  Location: [place];  Service: Open Heart Surgery;  Laterality: N/A;   RIGHT/LEFT HEART CATH AND CORONARY ANGIOGRAPHY N/A 01/12/2020   Procedure: RIGHT/LEFT HEART CATH AND CORONARY ANGIOGRAPHY;  Surgeon: Burnell Blanks, MD;  Location: [place] CV LAB;  Service: Cardiovascular;  Laterality: N/A;   TONSILLECTOMY     TRANSCATHETER AORTIC VALVE REPLACEMENT, TRANSFEMORAL N/A 01/31/2020   Procedure: TRANSCATHETER AORTIC VALVE REPLACEMENT, TRANSFEMORAL;  Surgeon: Burnell Blanks, MD;  Location: [place];  Service: Open Heart Surgery;  Laterality: N/A;     Current Outpatient Medications  Medication Sig Dispense Refill   ACCU-CHEK GUIDE test strip USE UP TO 4 TIMES A DAY AS DIRECTED 100 strip 0   Accu-Chek Softclix Lancets lancets      aspirin EC 81 MG tablet Take 81 mg by mouth daily.      docusate sodium (DOK) 100 MG capsule Take 1 capsule (100 mg total) by mouth 2 (two) times daily as needed. 100 capsule 5   finasteride (PROSCAR) 5 MG tablet Take 1 tablet (5 mg total) by mouth daily. 90 tablet 3   folic acid (FOLVITE) 1 MG tablet Take 1 tablet by mouth daily.     gabapentin (NEURONTIN) 100 MG capsule Take 100 mg by mouth at bedtime.     hydroxychloroquine (PLAQUENIL) 200 MG tablet Take 1 tablet by mouth daily.     insulin glargine-yfgn (SEMGLEE) 100 UNIT/ML Pen INJECT 10 UNITS SUBCUTANEOUSLY IN THE MORNING AND INJECT 6 UNITS EVERY EVENING FOR DIABETES *REPLACES LEVEMIR* (USE WITHIN 28 DAYS AFTER OPENING PEN)     Insulin Pen Needle (B-D  UF III MINI PEN NEEDLES) 31G X 5 MM MISC USE AS DIRECTED WITH LEVEMIR INSULIN PENS AT BEDTIME, once a day 100 each 3   lisinopril (ZESTRIL) 40 MG tablet TAKE ONE TABLET BY MOUTH DAILY FOR BLOOD PRESSURE     Omega-3 Fatty Acids (FISH OIL) 1000 MG CAPS Take 1 capsule (1,000 mg total) by mouth daily. 90 capsule 3   predniSONE (DELTASONE) 5 MG tablet Take 1 tablet by mouth daily.     tamsulosin (FLOMAX) 0.4 MG CAPS capsule TAKE 1 CAPSULE BY MOUTH EVERY DAY 30 capsule 5   No current facility-administered medications for this visit.    Allergies:   Amlodipine, Carvedilol, Niaspan [niacin er], Shellfish allergy, Statins, Sulfa antibiotics, Zetia [ezetimibe], and Januvia [sitagliptin]    Social History:  The patient  reports that he quit smoking about 29 years ago. His smoking use included cigarettes. He has a 29.25 pack-year smoking history. He has never used smokeless tobacco. He reports that he does not drink alcohol and does not  use drugs.   Family History:  The patient's family history includes Congestive Heart Failure in an other family member; Diabetes in his brother; Heart attack in an other family member; Heart disease in an other family member; Hyperlipidemia in his father.    ROS:  Please see the history of present illness. (+) Bilateral feet pain All other systems are reviewed and negative.   PHYSICAL EXAM: VS:  BP (!) 152/54   Pulse (!) 56   Ht '5\' 10"'$  (1.778 m)   Wt 147 lb 4.8 oz (66.8 kg)   BMI 21.14 kg/m  , BMI Body mass index is 21.14 kg/m. GENERAL:  Well appearing HEENT: Pupils equal round and reactive, fundi not visualized, oral mucosa unremarkable NECK:  No jugular venous distention, waveform within normal limits, carotid upstroke brisk and symmetric, no bruits, no thyromegaly LUNGS:  Clear to auscultation bilaterally HEART:  RRR.  PMI not displaced or sustained,S1 and S2 within normal limits, no S3, no S4, no clicks, no rubs, II/VI systolic murmur ABD:  Flat, positive  bowel sounds normal in frequency in pitch, no bruits, no rebound, no guarding, no midline pulsatile mass, no hepatomegaly, no splenomegaly EXT:  2 plus pulses throughout, no edema, no cyanosis no clubbing SKIN:  No rashes no nodules NEURO:  Cranial nerves II through XII grossly intact, motor grossly intact throughout PSYCH:  Cognitively intact, oriented to person place and time  EKG:   09/19/21: Sinus bradycardia.  Rte 56 bpm.  07/31/21: Sinus rhythm, rate 93 bpm 10/31/19: Sinus rhythm.  Rate 86 bpm. 12/04/17: Sinus rhythm.  Rate 82 bmp.    Echo 01/30/21  1. Left ventricular ejection fraction, by estimation, is 70 to 75%. The  left ventricle has hyperdynamic function. The left ventricle has no  regional wall motion abnormalities. There is moderate concentric left  ventricular hypertrophy. Left ventricular diastolic parameters are consistent with Grade I diastolic dysfunction (impaired relaxation).   2. Right ventricular systolic function is normal. The right ventricular  size is normal. Mildly increased right ventricular wall thickness.  Tricuspid regurgitation signal is inadequate for assessing PA pressure.   3. Left atrial size was mildly dilated.   4. The mitral valve is normal in structure. No evidence of mitral valve  regurgitation.   5. The aortic valve has been repaired/replaced. Aortic valve  regurgitation is mild. There is a valve present in the aortic position.  Echo findings are consistent with perivalvular leak of the aortic  prosthesis. Aortic valve mean gradient measures 9.4 mmHg. Aortic valve Vmax measures 1.97 m/s. Aortic valve acceleration time measures 60 msec.   6. Aortic dilatation noted. There is mild dilatation of the aortic root,  measuring 39 mm.   7. The inferior vena cava is normal in size with greater than 50%  respiratory variability, suggesting right atrial pressure of 3 mmHg.  Comparison(s): No significant change from prior study. Prior images  reviewed side by  side.  Echo 09/29/19: IMPRESSIONS   1. Left ventricular ejection fraction, by visual estimation, is 65 to  70%. The left ventricle has hyperdynamic function. There is moderately  increased left ventricular hypertrophy.   2. Elevated left atrial pressure.   3. Left ventricular diastolic parameters are consistent with Grade I  diastolic dysfunction (impaired relaxation).   4. The left ventricle has no regional wall motion abnormalities.   5. Global right ventricle has normal systolic function.The right  ventricular size is normal. No increase in right ventricular wall  thickness.   6. Left  atrial size was normal.   7. Right atrial size was normal.   8. The mitral valve is normal in structure. Mild mitral valve  regurgitation. No evidence of mitral stenosis.   9. The tricuspid valve is normal in structure.  10. The tricuspid valve is normal in structure. Tricuspid valve  regurgitation is mild.  11. Aortic valve mean gradient measures 37.0 mmHg.  12. The aortic valve is normal in structure. Aortic valve regurgitation is  not visualized. Severe aortic valve stenosis.  13. There is severe calcifcation of the aortic valve.  14. There is severe thickening of the aortic valve.  15. The pulmonic valve was normal in structure. Pulmonic valve  regurgitation is not visualized.  16. Normal pulmonary artery systolic pressure.  17. The tricuspid regurgitant velocity is 2.33 m/s, and with an assumed  right atrial pressure of 3 mmHg, the estimated right ventricular systolic  pressure is normal at 24.7 mmHg.  18. The inferior vena cava is normal in size with greater than 50%  respiratory variability, suggesting right atrial pressure of 3 mmHg.  19. Severe aortic stenosis, peak/mean transaortic gradients 58/37 mmHg,  dimensionless index 0.26.   48 hour Holter 09/16/18: Minimum HR: 51 BPM at 6:27:33 AM Maximum HR: 120 BPM at 11:59:29 PM Average HR: 73 BPM Rare PVCs and PACs Zero atrial  fibrillation One episode of ventricular bigeminy No other arrhythmias noted No symptoms submitted  Exercise Cardiolite 09/28/15: Nuclear stress EF: 51%. The left ventricular ejection fraction is mildly decreased (45-54%). There was no ST segment deviation noted during stress. The study is normal. Normal stress nuclear study with no ischemia or infarction; low normal LV function with EF 51 and normal wall motion.   Recent Labs: No results found for requested labs within last 365 days.    Lipid Panel    Component Value Date/Time   CHOL 174 11/21/2019 0904   TRIG 77 11/21/2019 0904   HDL 51 11/21/2019 0904   CHOLHDL 3.4 11/21/2019 0904   VLDL 17 02/27/2017 0844   LDLCALC 106 (H) 11/21/2019 0904      Wt Readings from Last 3 Encounters:  09/19/22 147 lb 4.8 oz (66.8 kg)  07/31/21 152 lb 12.8 oz (69.3 kg)  01/30/21 137 lb (62.1 kg)      ASSESSMENT AND PLAN:  # Hypertension: - The patient's blood pressure has been lower at home, with occasional dizziness upon standing. No changes to the current antihypertensive medications will be made at this time.  His blood pressure is much higher in the office today.  He will check his blood pressures at home for 2 weeks and his wife will call back with the update.  For now, continue lisinopril.  # Hyperlipidemia: - The patient has had difficulty tolerating statins and Zetia due to severe pain. A newer medication, Repatha, is suggested as an alternative.  He is going to have labs checked at the [place] next week and gets all his medications through them.  # Aortic stenosis s/p TAVR:  TAVR was functioning normally 04/2021.  He has no HF symptoms.  Repeat echo next year.  4. Exercise: - The patient is not engaging in regular exercise, aside from yard work during the summer months. - Plan: Encourage the patient to increase physical activity, such as walking in their neighborhood with sidewalks.    Current medicines are reviewed at length with  the patient today.  The patient does not have concerns regarding medicines.  The following changes have been made:  none Labs/ tests ordered today include:   Orders Placed This Encounter  Procedures   EKG 12-Lead     Disposition:   FU with Atiya Yera C. Oval Linsey, MD, [facility] in 6 months     Signed, Leldon Steege C. Oval Linsey, MD, [facility]  09/19/2022 10:51 AM    [facility]

## 2022-10-03 ENCOUNTER — Telehealth (HOSPITAL_BASED_OUTPATIENT_CLINIC_OR_DEPARTMENT_OTHER): Payer: Self-pay

## 2022-10-03 NOTE — Telephone Encounter (Signed)
BP's look great. Continue same!  Loel Dubonnet, NP

## 2022-10-03 NOTE — Telephone Encounter (Addendum)
Called patient and spoke with patients wife, she states he saw his [place] doc shortly after our appointment, Bps in the 170s and 180s  Lisinopril 74m in the evening was added, Bps now running in the 120 and 130s/ 80s.      ----- Message from KGerald Stabs RN sent at 09/19/2022 10:06 AM EST ----- Call to get BP logs

## 2023-03-19 ENCOUNTER — Emergency Department (HOSPITAL_COMMUNITY): Payer: No Typology Code available for payment source

## 2023-03-19 ENCOUNTER — Inpatient Hospital Stay (HOSPITAL_COMMUNITY)
Admission: EM | Admit: 2023-03-19 | Discharge: 2023-03-24 | DRG: 378 | Disposition: A | Discharge status: Home Health | Payer: No Typology Code available for payment source | Attending: Family Medicine | Admitting: Family Medicine

## 2023-03-19 ENCOUNTER — Other Ambulatory Visit: Payer: Self-pay

## 2023-03-19 DIAGNOSIS — K922 Gastrointestinal hemorrhage, unspecified: Secondary | ICD-10-CM | POA: Diagnosis present

## 2023-03-19 DIAGNOSIS — K3189 Other diseases of stomach and duodenum: Secondary | ICD-10-CM | POA: Diagnosis present

## 2023-03-19 DIAGNOSIS — Z7952 Long term (current) use of systemic steroids: Secondary | ICD-10-CM

## 2023-03-19 DIAGNOSIS — D631 Anemia in chronic kidney disease: Secondary | ICD-10-CM | POA: Diagnosis present

## 2023-03-19 DIAGNOSIS — D62 Acute posthemorrhagic anemia: Secondary | ICD-10-CM | POA: Diagnosis present

## 2023-03-19 DIAGNOSIS — Z794 Long term (current) use of insulin: Secondary | ICD-10-CM

## 2023-03-19 DIAGNOSIS — N1832 Chronic kidney disease, stage 3b: Secondary | ICD-10-CM | POA: Diagnosis present

## 2023-03-19 DIAGNOSIS — E1129 Type 2 diabetes mellitus with other diabetic kidney complication: Secondary | ICD-10-CM | POA: Diagnosis present

## 2023-03-19 DIAGNOSIS — N400 Enlarged prostate without lower urinary tract symptoms: Secondary | ICD-10-CM | POA: Diagnosis present

## 2023-03-19 DIAGNOSIS — Z7982 Long term (current) use of aspirin: Secondary | ICD-10-CM

## 2023-03-19 DIAGNOSIS — R55 Syncope and collapse: Secondary | ICD-10-CM | POA: Diagnosis not present

## 2023-03-19 DIAGNOSIS — K5790 Diverticulosis of intestine, part unspecified, without perforation or abscess without bleeding: Secondary | ICD-10-CM | POA: Diagnosis present

## 2023-03-19 DIAGNOSIS — Z87891 Personal history of nicotine dependence: Secondary | ICD-10-CM

## 2023-03-19 DIAGNOSIS — N183 Chronic kidney disease, stage 3 unspecified: Secondary | ICD-10-CM | POA: Diagnosis present

## 2023-03-19 DIAGNOSIS — F0280 Dementia in other diseases classified elsewhere without behavioral disturbance: Secondary | ICD-10-CM | POA: Diagnosis present

## 2023-03-19 DIAGNOSIS — E785 Hyperlipidemia, unspecified: Secondary | ICD-10-CM | POA: Diagnosis present

## 2023-03-19 DIAGNOSIS — F03911 Unspecified dementia, unspecified severity, with agitation: Secondary | ICD-10-CM | POA: Diagnosis present

## 2023-03-19 DIAGNOSIS — I130 Hypertensive heart and chronic kidney disease with heart failure and stage 1 through stage 4 chronic kidney disease, or unspecified chronic kidney disease: Secondary | ICD-10-CM | POA: Diagnosis present

## 2023-03-19 DIAGNOSIS — K264 Chronic or unspecified duodenal ulcer with hemorrhage: Principal | ICD-10-CM | POA: Diagnosis present

## 2023-03-19 DIAGNOSIS — Z8249 Family history of ischemic heart disease and other diseases of the circulatory system: Secondary | ICD-10-CM

## 2023-03-19 DIAGNOSIS — M064 Inflammatory polyarthropathy: Secondary | ICD-10-CM | POA: Diagnosis present

## 2023-03-19 DIAGNOSIS — F05 Delirium due to known physiological condition: Secondary | ICD-10-CM | POA: Diagnosis present

## 2023-03-19 DIAGNOSIS — K5791 Diverticulosis of intestine, part unspecified, without perforation or abscess with bleeding: Secondary | ICD-10-CM

## 2023-03-19 DIAGNOSIS — F0390 Unspecified dementia without behavioral disturbance: Secondary | ICD-10-CM

## 2023-03-19 DIAGNOSIS — N1831 Chronic kidney disease, stage 3a: Secondary | ICD-10-CM | POA: Diagnosis present

## 2023-03-19 DIAGNOSIS — Z83438 Family history of other disorder of lipoprotein metabolism and other lipidemia: Secondary | ICD-10-CM

## 2023-03-19 DIAGNOSIS — Z833 Family history of diabetes mellitus: Secondary | ICD-10-CM

## 2023-03-19 DIAGNOSIS — I10 Essential (primary) hypertension: Secondary | ICD-10-CM

## 2023-03-19 DIAGNOSIS — R000 Tachycardia, unspecified: Secondary | ICD-10-CM | POA: Diagnosis present

## 2023-03-19 DIAGNOSIS — Z952 Presence of prosthetic heart valve: Secondary | ICD-10-CM

## 2023-03-19 DIAGNOSIS — E1122 Type 2 diabetes mellitus with diabetic chronic kidney disease: Secondary | ICD-10-CM | POA: Diagnosis present

## 2023-03-19 DIAGNOSIS — Z79899 Other long term (current) drug therapy: Secondary | ICD-10-CM

## 2023-03-19 LAB — URINALYSIS, W/ REFLEX TO CULTURE (INFECTION SUSPECTED)
Bacteria, UA: NONE SEEN
Bilirubin Urine: NEGATIVE
Glucose, UA: >=500 mg/dL — AB
Hgb urine dipstick: NEGATIVE
Ketones, ur: 5 mg/dL — AB
Leukocytes,Ua: NEGATIVE
Nitrite: NEGATIVE
Protein, ur: NEGATIVE mg/dL
Specific Gravity, Urine: 1.014 (ref 1.005–1.030)
pH: 6 (ref 5.0–8.0)

## 2023-03-19 LAB — COMPREHENSIVE METABOLIC PANEL
ALT: 11 U/L (ref 0–44)
AST: 17 U/L (ref 15–41)
Albumin: 2.8 g/dL — ABNORMAL LOW (ref 3.5–5.0)
Alkaline Phosphatase: 30 U/L — ABNORMAL LOW (ref 38–126)
Anion gap: 8 (ref 5–15)
BUN: 37 mg/dL — ABNORMAL HIGH (ref 8–23)
CO2: 21 mmol/L — ABNORMAL LOW (ref 22–32)
Calcium: 8.5 mg/dL — ABNORMAL LOW (ref 8.9–10.3)
Chloride: 109 mmol/L (ref 98–111)
Creatinine, Ser: 1.25 mg/dL — ABNORMAL HIGH (ref 0.61–1.24)
GFR, Estimated: 56 mL/min — ABNORMAL LOW (ref >60)
Glucose, Bld: 191 mg/dL — ABNORMAL HIGH (ref 70–99)
Potassium: 4.3 mmol/L (ref 3.5–5.1)
Sodium: 138 mmol/L (ref 135–145)
Total Bilirubin: 0.5 mg/dL (ref 0.3–1.2)
Total Protein: 5.2 g/dL — ABNORMAL LOW (ref 6.5–8.1)

## 2023-03-19 LAB — HEMOGLOBIN A1C
Hgb A1c MFr Bld: 8 % — ABNORMAL HIGH (ref 4.8–5.6)
Mean Plasma Glucose: 182.9 mg/dL

## 2023-03-19 LAB — CBC WITH DIFFERENTIAL/PLATELET
Abs Immature Granulocytes: 0.07 10*3/uL (ref 0.00–0.07)
Basophils Absolute: 0 10*3/uL (ref 0.0–0.1)
Basophils Relative: 0 %
Eosinophils Absolute: 0.1 10*3/uL (ref 0.0–0.5)
Eosinophils Relative: 1 %
HCT: 24.8 % — ABNORMAL LOW (ref 39.0–52.0)
Hemoglobin: 7.6 g/dL — ABNORMAL LOW (ref 13.0–17.0)
Immature Granulocytes: 1 %
Lymphocytes Relative: 19 %
Lymphs Abs: 1.7 10*3/uL (ref 0.7–4.0)
MCH: 27.7 pg (ref 26.0–34.0)
MCHC: 30.6 g/dL (ref 30.0–36.0)
MCV: 90.5 fL (ref 80.0–100.0)
Monocytes Absolute: 0.6 10*3/uL (ref 0.1–1.0)
Monocytes Relative: 7 %
Neutro Abs: 6.7 10*3/uL (ref 1.7–7.7)
Neutrophils Relative %: 72 %
Platelets: 199 10*3/uL (ref 150–400)
RBC: 2.74 MIL/uL — ABNORMAL LOW (ref 4.22–5.81)
RDW: 14.9 % (ref 11.5–15.5)
WBC: 9.2 10*3/uL (ref 4.0–10.5)
nRBC: 0 % (ref 0.0–0.2)

## 2023-03-19 LAB — TYPE AND SCREEN
ABO/RH(D): O POS
Antibody Screen: NEGATIVE
Unit division: 0

## 2023-03-19 LAB — PROTIME-INR
INR: 1.2 (ref 0.8–1.2)
Prothrombin Time: 15.1 seconds (ref 11.4–15.2)

## 2023-03-19 LAB — LIPASE, BLOOD: Lipase: 50 U/L (ref 11–51)

## 2023-03-19 LAB — TROPONIN I (HIGH SENSITIVITY)
Troponin I (High Sensitivity): 6 ng/L (ref <18)
Troponin I (High Sensitivity): 5 ng/L (ref <18)

## 2023-03-19 LAB — GLUCOSE, CAPILLARY
Glucose-Capillary: 139 mg/dL — ABNORMAL HIGH (ref 70–99)
Glucose-Capillary: 219 mg/dL — ABNORMAL HIGH (ref 70–99)
Glucose-Capillary: 190 mg/dL — ABNORMAL HIGH (ref 70–99)

## 2023-03-19 LAB — BPAM RBC
Blood Product Expiration Date: 202408262359
ISSUE DATE / TIME: 202407251646
Unit Type and Rh: 5100

## 2023-03-19 LAB — PREPARE RBC (CROSSMATCH)

## 2023-03-19 LAB — POC OCCULT BLOOD, ED: Fecal Occult Bld: POSITIVE — AB

## 2023-03-19 MED ORDER — PANTOPRAZOLE SODIUM 40 MG PO TBEC
40.0000 mg | DELAYED_RELEASE_TABLET | Freq: Every day | ORAL | Status: DC
  Administered 2023-03-19: 40 mg via ORAL
  Filled 2023-03-19 (×2): qty 1

## 2023-03-19 MED ORDER — SODIUM CHLORIDE 0.9 % IV BOLUS: 500.0000 mL | Freq: Once | INTRAVENOUS | Status: DC

## 2023-03-19 MED ORDER — INSULIN ASPART 100 UNIT/ML IJ SOLN
0.0000 [IU] | Freq: Three times a day (TID) | INTRAMUSCULAR | Status: DC
  Administered 2023-03-20 – 2023-03-21 (×3): 1 [IU] via SUBCUTANEOUS

## 2023-03-19 MED ORDER — GABAPENTIN 100 MG PO CAPS
100.0000 mg | ORAL_CAPSULE | Freq: Every day | ORAL | Status: DC
  Administered 2023-03-19 – 2023-03-22 (×4): 100 mg via ORAL
  Filled 2023-03-19 (×4): qty 1

## 2023-03-19 MED ORDER — ALBUTEROL SULFATE (2.5 MG/3ML) 0.083% IN NEBU: 2.5000 mg | INHALATION_SOLUTION | Freq: Four times a day (QID) | RESPIRATORY_TRACT | Status: DC | PRN

## 2023-03-19 MED ORDER — ACETAMINOPHEN 325 MG PO TABS
650.0000 mg | ORAL_TABLET | Freq: Four times a day (QID) | ORAL | Status: DC | PRN
  Administered 2023-03-20 – 2023-03-23 (×4): 650 mg via ORAL
  Filled 2023-03-19 (×4): qty 2

## 2023-03-19 MED ORDER — LORAZEPAM 2 MG/ML IJ SOLN
0.5000 mg | Freq: Four times a day (QID) | INTRAMUSCULAR | Status: DC | PRN
  Administered 2023-03-19: 0.5 mg via INTRAVENOUS
  Filled 2023-03-19: qty 1

## 2023-03-19 MED ORDER — ACETAMINOPHEN 650 MG RE SUPP: 650.0000 mg | Freq: Four times a day (QID) | RECTAL | Status: DC | PRN

## 2023-03-19 MED ORDER — ONDANSETRON HCL 4 MG PO TABS: 4.0000 mg | ORAL_TABLET | Freq: Four times a day (QID) | ORAL | Status: DC | PRN

## 2023-03-19 MED ORDER — TAMSULOSIN HCL 0.4 MG PO CAPS
0.4000 mg | ORAL_CAPSULE | Freq: Every day | ORAL | Status: DC
  Administered 2023-03-19 – 2023-03-24 (×6): 0.4 mg via ORAL
  Filled 2023-03-19 (×6): qty 1

## 2023-03-19 MED ORDER — FINASTERIDE 5 MG PO TABS
5.0000 mg | ORAL_TABLET | Freq: Every day | ORAL | Status: DC
  Administered 2023-03-19 – 2023-03-24 (×6): 5 mg via ORAL
  Filled 2023-03-19 (×6): qty 1

## 2023-03-19 MED ORDER — IOHEXOL 350 MG/ML SOLN
100.0000 mL | Freq: Once | INTRAVENOUS | Status: AC | PRN
  Administered 2023-03-19: 100 mL via INTRAVENOUS

## 2023-03-19 MED ORDER — HALOPERIDOL LACTATE 5 MG/ML IJ SOLN
5.0000 mg | Freq: Once | INTRAMUSCULAR | Status: AC
  Administered 2023-03-19: 5 mg via INTRAVENOUS
  Filled 2023-03-19: qty 1

## 2023-03-19 MED ORDER — PREDNISONE 10 MG PO TABS
5.0000 mg | ORAL_TABLET | Freq: Every day | ORAL | Status: DC
  Administered 2023-03-19 – 2023-03-24 (×6): 5 mg via ORAL
  Filled 2023-03-19 (×6): qty 1

## 2023-03-19 MED ORDER — ONDANSETRON HCL 4 MG/2ML IJ SOLN: 4.0000 mg | Freq: Four times a day (QID) | INTRAMUSCULAR | Status: DC | PRN

## 2023-03-19 MED ORDER — SODIUM CHLORIDE 0.9% IV SOLUTION: Freq: Once | INTRAVENOUS | Status: AC

## 2023-03-19 MED ORDER — INSULIN GLARGINE-YFGN 100 UNIT/ML ~~LOC~~ SOLN
6.0000 [IU] | Freq: Every day | SUBCUTANEOUS | Status: DC
  Administered 2023-03-19 – 2023-03-24 (×6): 6 [IU] via SUBCUTANEOUS
  Filled 2023-03-19 (×6): qty 0.06

## 2023-03-19 NOTE — ED Notes (Signed)
ED TO INPATIENT HANDOFF REPORT  ED Nurse Name and Phone #: [phone]    S Name/Age/Gender Brian [name] 89 y.o. male Room/Bed: 028C/028C  Code Status   Code Status: Prior  Home/SNF/Other Home Patient oriented to: self Is this baseline? Yes   Triage Complete: Triage complete  Chief Complaint Syncope and collapse [R55]  Triage Note Pt to ed from home with a CC of syncopal episode while on toilet this morning. Ems relays pt also had some bright red blood In toilet bowl. Pt was unconscious for about 3-4 min per pt wife. Pt was then able to ambulate out of the house and to ambulance. Pt denies cp, dizziness currently.    Allergies Allergies  Allergen Reactions   Amlodipine    Carvedilol Other (See Comments)    bradycardia   Niaspan [Niacin Er]    Shellfish Allergy Other (See Comments)    Pt vomits   Statins Other (See Comments)    Myalgia   Sulfa Antibiotics    Zetia [Ezetimibe]    Januvia [Sitagliptin] Rash    Level of Care/Admitting Diagnosis ED Disposition     ED Disposition  Admit   Condition  --   Comment  Hospital Area: [facility] [[id number]]  Level of Care: Telemetry Medical [104]  May place patient in observation at [facility] or [facility] if equivalent level of care is available:: No  Covid Evaluation: Asymptomatic - no recent exposure (last 10 days) testing not required  Diagnosis: Syncope and collapse [780.2.ICD-9-CM]  Admitting Physician: Clydie Braun [[id number]]  Attending Physician: Clydie Braun [[id number]]          B Medical/Surgery History Past Medical History:  Diagnosis Date   Anemia of chronic renal failure 05/08/2015   Aortic stenosis, severe 09/21/2015   Arthritis    Bladder stone    Chronic kidney disease (CKD), stage III (moderate) (HCC) 05/08/2015   Followed by Dr. 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 Herb Grays and Cornelius Moras   Type 2 diabetes mellitus (HCC)    Past Surgical History:  Procedure Laterality Date   COLONOSCOPY     CYSTOSCOPY/RETROGRADE/URETEROSCOPY/STONE EXTRACTION WITH BASKET N/A 03/22/2013   Procedure: CYSTOSCOPY BLADDER STONE STONE EXTRACTION;  Surgeon: Lindaann Slough, MD;  Location: [facility] [place];  Service: Urology;  Laterality: N/A;  CYSTOSCOPY     INTRAOPERATIVE TRANSTHORACIC ECHOCARDIOGRAM N/A 01/31/2020   Procedure: INTRAOPERATIVE TRANSTHORACIC ECHOCARDIOGRAM;  Surgeon: Kathleene Hazel, MD;  Location: [facility] OR;  Service: Open Heart Surgery;  Laterality: N/A;   RIGHT/LEFT HEART CATH AND CORONARY ANGIOGRAPHY N/A 01/12/2020   Procedure: RIGHT/LEFT HEART CATH AND CORONARY ANGIOGRAPHY;  Surgeon: Kathleene Hazel, MD;  Location: [facility] INVASIVE CV LAB;  Service: Cardiovascular;  Laterality: N/A;   TONSILLECTOMY     TRANSCATHETER AORTIC VALVE REPLACEMENT, TRANSFEMORAL N/A 01/31/2020   Procedure: TRANSCATHETER AORTIC VALVE REPLACEMENT, TRANSFEMORAL;  Surgeon: Kathleene Hazel, MD;  Location: [facility] OR;  Service: Open Heart Surgery;  Laterality: N/A;     A IV Location/Drains/Wounds Patient Lines/Drains/Airways  Status     Active Line/Drains/Airways     Name Placement date Placement time Site Days   Peripheral IV 03/19/23 20 G Right Antecubital 03/19/23  0756  Antecubital  less than 1   Closed System Drain 1 Right Abdomen Bulb (JP) 12 Fr. 07/21/15  1418  Abdomen  2798   Incision 03/22/13 Perineum Other (Comment) 03/22/13  0956  -- 3649   Incision (Closed) 01/31/20 Groin Left 01/31/20  1130  -- 1143   Incision (Closed) 01/31/20 Groin Right 01/31/20  1130  -- 1143             Intake/Output Last 24 hours No intake or output data in the 24 hours ending 03/19/23 1305  Labs/Imaging Results for orders placed or performed during the hospital encounter of 03/19/23 (from the past 48 hour(s))  Comprehensive metabolic panel     Status: Abnormal   Collection Time: 03/19/23  7:39 AM  Result Value Ref Range   Sodium 138 135 - 145 mmol/L   Potassium 4.3 3.5 - 5.1 mmol/L   Chloride 109 98 - 111 mmol/L   CO2 21 (L) 22 - 32 mmol/L   Glucose, Bld 191 (H) 70 - 99 mg/dL    Comment: Glucose reference range applies only to samples taken after fasting for at least 8 hours.   BUN 37 (H) 8 - 23 mg/dL   Creatinine, Ser 3.01 (H) 0.61 - 1.24 mg/dL   Calcium 8.5 (L) 8.9 - 10.3 mg/dL   Total Protein 5.2 (L) 6.5 - 8.1 g/dL   Albumin 2.8 (L) 3.5 - 5.0 g/dL   AST 17 15 - 41 U/L   ALT 11 0 - 44 U/L   Alkaline Phosphatase 30 (L) 38 - 126 U/L   Total Bilirubin 0.5 0.3 - 1.2 mg/dL   GFR, Estimated 56 (L) >60 mL/min    Comment: (NOTE) Calculated using the CKD-EPI Creatinine Equation (2021)    Anion gap 8 5 - 15    Comment: Performed at [facility], [address] [place], [place] [address]  Lipase, blood     Status: None   Collection Time: 03/19/23  7:39 AM  Result Value Ref Range   Lipase 50 11 - 51 U/L    Comment: Performed at [facility], [address] [place], [place] [address]  Troponin I (High Sensitivity)     Status: None   Collection Time: 03/19/23  7:39 AM  Result Value Ref Range   Troponin I (High Sensitivity) 6 <18 ng/L    Comment: (NOTE) Elevated high sensitivity troponin I (hsTnI) values and significant  changes across serial measurements may suggest ACS but many other  chronic and acute conditions are known to elevate hsTnI results.  Refer to the "Links" section for chest pain algorithms and additional  guidance. Performed at [facility], [address] [place], [place] [address]   CBC with Differential     Status: Abnormal    Collection Time: 03/19/23  7:39 AM  Result Value Ref Range   WBC 9.2 4.0 - 10.5 K/uL   RBC 2.74 (L) 4.22 - 5.81 MIL/uL   Hemoglobin 7.6 (L) 13.0 - 17.0 g/dL   HCT 25.4 (L) 27.0 - 62.3 %   MCV 90.5 80.0 - 100.0 fL   MCH 27.7 26.0 - 34.0 pg   MCHC 30.6 30.0 - 36.0 g/dL   RDW 76.2 83.1 - 51.7 %   Platelets 199 150 - 400 K/uL   nRBC  0.0 0.0 - 0.2 %   Neutrophils Relative % 72 %   Neutro Abs 6.7 1.7 - 7.7 K/uL   Lymphocytes Relative 19 %   Lymphs Abs 1.7 0.7 - 4.0 K/uL   Monocytes Relative 7 %   Monocytes Absolute 0.6 0.1 - 1.0 K/uL   Eosinophils Relative 1 %   Eosinophils Absolute 0.1 0.0 - 0.5 K/uL   Basophils Relative 0 %   Basophils Absolute 0.0 0.0 - 0.1 K/uL   Immature Granulocytes 1 %   Abs Immature Granulocytes 0.07 0.00 - 0.07 K/uL    Comment: Performed at [facility], [address], [place], [place] [address]  Protime-INR     Status: None   Collection Time: 03/19/23  7:39 AM  Result Value Ref Range   Prothrombin Time 15.1 11.4 - 15.2 seconds   INR 1.2 0.8 - 1.2    Comment: (NOTE) INR goal varies based on device and disease states. Performed at [facility], [address], [place], [place] [address]   Urinalysis, w/ Reflex to Culture (Infection Suspected) -Urine, Clean Catch     Status: Abnormal   Collection Time: 03/19/23  7:43 AM  Result Value Ref Range   Specimen Source URINE, CLEAN CATCH    Color, Urine STRAW (A) YELLOW   APPearance CLEAR CLEAR   Specific Gravity, Urine 1.014 1.005 - 1.030   pH 6.0 5.0 - 8.0   Glucose, UA >=500 (A) NEGATIVE mg/dL   Hgb urine dipstick NEGATIVE NEGATIVE   Bilirubin Urine NEGATIVE NEGATIVE   Ketones, ur 5 (A) NEGATIVE mg/dL   Protein, ur NEGATIVE NEGATIVE mg/dL   Nitrite NEGATIVE NEGATIVE   Leukocytes,Ua NEGATIVE NEGATIVE   RBC / HPF 0-5 0 - 5 RBC/hpf   WBC, UA 0-5 0 - 5 WBC/hpf    Comment:        Reflex urine culture not performed if WBC <=10, OR if Squamous epithelial cells >5. If Squamous epithelial cells  >5 suggest recollection.    Bacteria, UA NONE SEEN NONE SEEN   Squamous Epithelial / HPF 0-5 0 - 5 /HPF   Mucus PRESENT    Hyaline Casts, UA PRESENT     Comment: Performed at [facility], [address], [place], [place] [address]  Type and screen [facility]     Status: None   Collection Time: 03/19/23  7:49 AM  Result Value Ref Range   ABO/RH(D) O POS    Antibody Screen NEG    Sample Expiration      03/22/2023,2359 Performed at [facility], [address] [place], [place] [address]   Troponin I (High Sensitivity)     Status: None   Collection Time: 03/19/23 10:07 AM  Result Value Ref Range   Troponin I (High Sensitivity) 5 <18 ng/L    Comment: (NOTE) Elevated high sensitivity troponin I (hsTnI) values and significant  changes across serial measurements may suggest ACS but many other  chronic and acute conditions are known to elevate hsTnI results.  Refer to the "Links" section for chest pain algorithms and additional  guidance. Performed at [facility], [address], [place], [place] [address]   POC occult blood, ED Provider will collect     Status: Abnormal   Collection Time: 03/19/23 12:24 PM  Result Value Ref Range   Fecal Occult Bld POSITIVE (A) NEGATIVE   CT ANGIO GI BLEED  Result Date: 03/19/2023 CLINICAL DATA:  Mesenteric ischemia,  acute. Per triage note, bright red blood per rectum and syncopal episode this morning. EXAM: CTA ABDOMEN AND PELVIS WITHOUT AND WITH CONTRAST TECHNIQUE: Multidetector CT imaging of the abdomen and pelvis was performed using the standard protocol during bolus administration of intravenous contrast. Multiplanar reconstructed images and MIPs were obtained and reviewed to evaluate the vascular anatomy. RADIATION DOSE REDUCTION: This exam was performed according to the departmental dose-optimization program which includes automated exposure control, adjustment of the mA and/or kV according to patient size  and/or use of iterative reconstruction technique. CONTRAST:  OMNIPAQUE IOHEXOL 350 MG/ML SOLN COMPARISON:  CTA chest/abdomen/pelvis 01/18/2020. FINDINGS: VASCULAR Aorta: Normal caliber aorta without aneurysm, dissection, vasculitis or significant stenosis. Extensive atherosclerotic vascular changes. Celiac: Patent without evidence of aneurysm, dissection, vasculitis or significant stenosis. SMA: Patent without evidence of aneurysm, dissection, vasculitis or significant stenosis. Renals: Both renal arteries are patent without evidence of aneurysm, dissection, vasculitis, fibromuscular dysplasia or significant stenosis. Mild atherosclerotic vascular changes at the origins. IMA: Patent without evidence of aneurysm, dissection, vasculitis or significant stenosis. Inflow: Patent without evidence of aneurysm, dissection, vasculitis or significant stenosis. Moderate atherosclerotic vascular changes. Proximal Outflow: Bilateral common femoral and visualized portions of the superficial and profunda femoral arteries are patent without evidence of aneurysm, dissection, vasculitis or significant stenosis. Moderate atherosclerotic vascular changes. Veins: Patent. Review of the MIP images confirms the above findings. NON-VASCULAR Lower chest: No acute abnormality.  Coronary artery calcifications. Hepatobiliary: No focal liver abnormality is seen. No gallstones, gallbladder wall thickening, or biliary dilatation. Pancreas: Unremarkable. No pancreatic ductal dilatation or surrounding inflammatory changes. Spleen: Normal. Adrenals/Urinary Tract: The adrenal glands are unremarkable. Punctate nonobstructing renal calculi in the upper pole right kidney (axial image 18 series 5). No hydronephrosis. Periureteral diverticulum of the left posterior bladder wall. Stomach/Bowel: Normal stomach and duodenum. No dilated loops of small bowel. Normal appendix is visualized on axial image 89 series 17. Extensive colonic diverticulosis. No  surrounding inflammation to suggest acute diverticulitis. No evidence of active extravasation of contrast to localize the patient's reported lower GI bleeding. Lymphatic: No abdominal or pelvic lymphadenopathy. Reproductive: Prostate is unremarkable. Other: No abdominal wall hernia or abnormality. No abdominopelvic ascites. Musculoskeletal: Degenerative changes of the right-greater-than-left hip joints. No acute bone findings. IMPRESSION: 1. No evidence of active extravasation of contrast to localize the patient's reported lower GI bleeding. No evidence of mesenteric ischemia. 2. Extensive colonic diverticulosis without evidence of acute diverticulitis. 3. Punctate nonobstructing renal calculi in the upper pole right kidney. No hydronephrosis. 4. Coronary atherosclerosis. Aortic Atherosclerosis (ICD10-I70.0). Electronically Signed   By: Orvan Falconer M.D.   On: 03/19/2023 10:39    Pending Labs Unresulted Labs (From admission, onward)    None       Vitals/Pain Today's Vitals   03/19/23 0738 03/19/23 1048 03/19/23 1130 03/19/23 1230  BP: 125/81 119/65 120/69 101/80  Pulse: (!) 105 83 85 91  Resp: 17 17 (!) 22 20  Temp: 98.2 F (36.8 C) 98.6 F (37 C)    TempSrc: Oral Oral    SpO2: 100% 100% 100% 95%  Height:      PainSc:        Isolation Precautions No active isolations  Medications Medications  sodium chloride 0.9 % bolus 500 mL (has no administration in time range)  iohexol (OMNIPAQUE) 350 MG/ML injection 100 mL (100 mLs Intravenous Contrast Given 03/19/23 0955)    Mobility walks     Focused Assessments GI   R Recommendations: See Admitting Provider Note  Report given  to:   Additional Notes:

## 2023-03-19 NOTE — ED Provider Notes (Signed)
Emergency Department Provider Note   I have reviewed the triage vital signs and the nursing notes.   HISTORY  Chief Complaint Loss of Consciousness   HPI Brian [name] is a 89 y.o. male with past history of CKD, hypertension, hyperlipidemia, diabetes presents to the emergency department with syncope this morning while her vertigo bowel movement.  Patient mated to the toilet without difficulty proceeded to have a brief loss of consciousness episode with having a bowel movement.  He noticed, along with family, that there was some bright red blood in the toilet, which is unusual for him.  No black stools.  Patient denies any abdominal or rectal pain.  No chest pain, palpitations, shortness of breath prior to syncope.  He is not anticoagulated.  EMS was called and he was able to ambulate out of the house and get into the ambulance on scene.  Patient states he is overall feeling well at this point.   Past Medical History:  Diagnosis Date   Anemia of chronic renal failure 05/08/2015   Aortic stenosis, severe 09/21/2015   Arthritis    Bladder stone    Chronic kidney disease (CKD), stage III (moderate) (HCC) 05/08/2015   Followed by Dr. 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 Herb Grays and Cornelius Moras   Type 2 diabetes mellitus (HCC)     Review of Systems  Constitutional: No fever/chills Cardiovascular: Denies chest pain. Respiratory: Denies shortness of breath. Gastrointestinal: No abdominal pain.  No nausea, no vomiting.  No diarrhea. BRBPR. Musculoskeletal: Negative for back pain. Skin: Negative  for rash. Neurological: Negative for headaches.  ____________________________________________   PHYSICAL EXAM:  VITAL SIGNS: ED Triage Vitals  Encounter Vitals Group     BP 03/19/23 0738 125/81     Pulse Rate 03/19/23 0738 (!) 105     Resp 03/19/23 0738 17     Temp 03/19/23 0738 98.2 F (36.8 C)     Temp Source 03/19/23 0738 Oral     SpO2 03/19/23 0738 100 %     Height 03/19/23 0732 5\' 10"  (1.778 m)   Constitutional: Alert and oriented. Well appearing and in no acute distress. Eyes: Conjunctivae are normal.  Head: Atraumatic. Nose: No congestion/rhinnorhea. Mouth/Throat: Mucous membranes are moist.   Neck: No stridor.   Cardiovascular: Normal rate, regular rhythm. Good peripheral circulation. Grossly normal heart sounds.   Respiratory: Normal respiratory effort.  No retractions. Lungs CTAB. Gastrointestinal: Soft and nontender. No distention.  Musculoskeletal: No lower extremity tenderness nor edema. No gross deformities of extremities. Neurologic:  Normal speech and language. No gross focal neurologic deficits are appreciated.  Skin:  Skin is warm, dry and intact. No rash noted.  ____________________________________________   LABS (all labs ordered are listed, but only abnormal results are displayed)  Labs Reviewed  COMPREHENSIVE METABOLIC PANEL - Abnormal; Notable for the following components:      Result Value   CO2 21 (*)    Glucose, Bld 191 (*)    BUN 37 (*)    Creatinine, Ser 1.25 (*)  Calcium 8.5 (*)    Total Protein 5.2 (*)    Albumin 2.8 (*)    Alkaline Phosphatase 30 (*)    GFR, Estimated 56 (*)    All other components within normal limits  CBC WITH DIFFERENTIAL/PLATELET - Abnormal; Notable for the following components:   RBC 2.74 (*)    Hemoglobin 7.6 (*)    HCT 24.8 (*)    All other components within normal limits  URINALYSIS, W/ REFLEX TO CULTURE (INFECTION SUSPECTED) - Abnormal; Notable for the following components:   Color, Urine STRAW (*)     Glucose, UA >=500 (*)    Ketones, ur 5 (*)    All other components within normal limits  POC OCCULT BLOOD, ED - Abnormal; Notable for the following components:   Fecal Occult Bld POSITIVE (*)    All other components within normal limits  LIPASE, BLOOD  PROTIME-INR  TYPE AND SCREEN  TROPONIN I (HIGH SENSITIVITY)  TROPONIN I (HIGH SENSITIVITY)   ____________________________________________  EKG   EKG Interpretation Date/Time:  Thursday March 19 2023 07:36:22 EDT Ventricular Rate:  95 PR Interval:  159 QRS Duration:  113 QT Interval:  343 QTC Calculation: 432 R Axis:   66  Text Interpretation: Sinus rhythm Anteroseptal infarct, age indeterminate Confirmed by Alona Bene [phone]) on 03/19/2023 8:08:21 AM        ____________________________________________  RADIOLOGY  CT ANGIO GI BLEED  Result Date: 03/19/2023 CLINICAL DATA:  Mesenteric ischemia, acute. Per triage note, bright red blood per rectum and syncopal episode this morning. EXAM: CTA ABDOMEN AND PELVIS WITHOUT AND WITH CONTRAST TECHNIQUE: Multidetector CT imaging of the abdomen and pelvis was performed using the standard protocol during bolus administration of intravenous contrast. Multiplanar reconstructed images and MIPs were obtained and reviewed to evaluate the vascular anatomy. RADIATION DOSE REDUCTION: This exam was performed according to the departmental dose-optimization program which includes automated exposure control, adjustment of the mA and/or kV according to patient size and/or use of iterative reconstruction technique. CONTRAST:  OMNIPAQUE IOHEXOL 350 MG/ML SOLN COMPARISON:  CTA chest/abdomen/pelvis 01/18/2020. FINDINGS: VASCULAR Aorta: Normal caliber aorta without aneurysm, dissection, vasculitis or significant stenosis. Extensive atherosclerotic vascular changes. Celiac: Patent without evidence of aneurysm, dissection, vasculitis or significant stenosis. SMA: Patent without evidence of aneurysm,  dissection, vasculitis or significant stenosis. Renals: Both renal arteries are patent without evidence of aneurysm, dissection, vasculitis, fibromuscular dysplasia or significant stenosis. Mild atherosclerotic vascular changes at the origins. IMA: Patent without evidence of aneurysm, dissection, vasculitis or significant stenosis. Inflow: Patent without evidence of aneurysm, dissection, vasculitis or significant stenosis. Moderate atherosclerotic vascular changes. Proximal Outflow: Bilateral common femoral and visualized portions of the superficial and profunda femoral arteries are patent without evidence of aneurysm, dissection, vasculitis or significant stenosis. Moderate atherosclerotic vascular changes. Veins: Patent. Review of the MIP images confirms the above findings. NON-VASCULAR Lower chest: No acute abnormality.  Coronary artery calcifications. Hepatobiliary: No focal liver abnormality is seen. No gallstones, gallbladder wall thickening, or biliary dilatation. Pancreas: Unremarkable. No pancreatic ductal dilatation or surrounding inflammatory changes. Spleen: Normal. Adrenals/Urinary Tract: The adrenal glands are unremarkable. Punctate nonobstructing renal calculi in the upper pole right kidney (axial image 18 series 5). No hydronephrosis. Periureteral diverticulum of the left posterior bladder wall. Stomach/Bowel: Normal stomach and duodenum. No dilated loops of small bowel. Normal appendix is visualized on axial image 89 series 17. Extensive colonic diverticulosis. No surrounding inflammation to suggest acute diverticulitis. No evidence of active extravasation of contrast to localize the patient's reported lower  GI bleeding. Lymphatic: No abdominal or pelvic lymphadenopathy. Reproductive: Prostate is unremarkable. Other: No abdominal wall hernia or abnormality. No abdominopelvic ascites. Musculoskeletal: Degenerative changes of the right-greater-than-left hip joints. No acute bone findings. IMPRESSION:  1. No evidence of active extravasation of contrast to localize the patient's reported lower GI bleeding. No evidence of mesenteric ischemia. 2. Extensive colonic diverticulosis without evidence of acute diverticulitis. 3. Punctate nonobstructing renal calculi in the upper pole right kidney. No hydronephrosis. 4. Coronary atherosclerosis. Aortic Atherosclerosis (ICD10-I70.0). Electronically Signed   By: Orvan Falconer M.D.   On: 03/19/2023 10:39    ____________________________________________   PROCEDURES  Procedure(s) performed:   Procedures  None  ____________________________________________   INITIAL IMPRESSION / ASSESSMENT AND PLAN / ED COURSE  Pertinent labs & imaging results that were available during my care of the patient were reviewed by me and considered in my medical decision making (see chart for details).   This patient is Presenting for Evaluation of syncope, which does require a range of treatment options, and is a complaint that involves a high risk of morbidity and mortality.  The Differential Diagnoses includes but is not exclusive to acute coronary syndrome, aortic dissection, pulmonary embolism, cardiac tamponade, community-acquired pneumonia, pericarditis, musculoskeletal chest wall pain, etc.   Critical Interventions-    Medications  sodium chloride 0.9 % bolus 500 mL (has no administration in time range)  iohexol (OMNIPAQUE) 350 MG/ML injection 100 mL (100 mLs Intravenous Contrast Given 03/19/23 0955)    Reassessment after intervention: tachycardia improved.    I did obtain Additional Historical Information from EMS and wife at bedside.   I did review outside records.  Patient's last CBC was from November 2023 showing hemoglobin of 10.2 at that time   Clinical Laboratory Tests Ordered, included CBC shows anemia with hemoglobin of 7.6. Fecal occult positive. Troponin negative. No AKI. No UTI.   Radiologic Tests Ordered, included CT bleed scan. I  independently interpreted the images and agree with radiology interpretation.   Cardiac Monitor Tracing which shows tachycardia (mild).    Social Determinants of Health Risk patient is a non-smoker.   Consult complete with [facility]. They will consult.   TRH with plan for admit.   Medical Decision Making: Summary:  Presents emergency department with syncope while on the toilet with bright red blood in the toilet after.  Very mild tachycardia in the ED improved with IV fluids.  He is not anticoagulated.  Plan for CT angio GI bleed scan along with screening blood work and reassess. Hb does appear to be down-trending from baseline.   Reevaluation with update and discussion with patient and family. Discussed labs and imaging results. Would accept PRBC if needed. Plan for admit for GI consultation and H/H trending.   Patient's presentation is most consistent with acute presentation with potential threat to life or bodily function.   Disposition: admit  ____________________________________________  FINAL CLINICAL IMPRESSION(S) / ED DIAGNOSES  Final diagnoses:  Syncope and collapse  Gastrointestinal hemorrhage, unspecified gastrointestinal hemorrhage type    Note:  This document was prepared using Dragon voice recognition software and may include unintentional dictation errors.  Alona Bene, MD, [facility] Emergency Medicine    Johndaniel Catlin, Arlyss Repress, MD 03/19/23 1323

## 2023-03-19 NOTE — ED Triage Notes (Signed)
Pt to ed from home with a CC of syncopal episode while on toilet this morning. Ems relays pt also had some bright red blood In toilet bowl. Pt was unconscious for about 3-4 min per pt wife. Pt was then able to ambulate out of the house and to ambulance. Pt denies cp, dizziness currently.

## 2023-03-19 NOTE — Consult Note (Signed)
Consultation Note   Referring Provider:  [facility] PCP: Orvilla Cornwall, MD Primary Gastroenterologist: Gentry Fitz        Reason for Consultation: GI bleed  DOA: 03/19/2023         Hospital Day: 1   ASSESSMENT & PLAN   Patient is a 89 y.o. year old male with a past medical history of DM2, HTN, severe AS, s/p TAVR, extensive diverticulosis, RA on plaquenil .   Pain hematochezia with syncopal episode. Syncope secondary to anemia vr vagal response ( happened on toilet) vrs transient hypotension. DDx: Diverticular bleed suspected. Atypical presentation for colon neoplasm. He takes asa + prednisone so brisk upper GI bleed possible but felt unlikely.  - CT angio negative for extravasation of blood into bowel  - Empiric BID IV PPI - May need colonoscopy this admission but will discuss with Dr. Tomasa Rand. There is some increased risk given advanced age and co-morbidities but he does seem relatively healthy.   Acute blood loss anemia secondary to above.  Hgb 7.6 on admission, down from baseline ~ 10 in May 2023 ( most recent labs found) -A unit of blood has been ordered   Severe AS s/p TAVR  CKD 3  See PMH for additional medical history    Pertinent GI History    Patient's wife helps with history . She gives a history of a diverticular bleed 10 or greater years ago.  She says he had a colonoscopy at the time.  I am unable to locate those records in epic.  No colonoscopy since.   HISTORY OF PRESENT ILLNESS   Patient presented to ED today for a syncopal episode on the toilet. In ED he was mildly tachycardic, vitals o/w stable. He woke up not feeling well around 5am. Wife took vitals which were stable. Then a few minutes later he went to bathroom to have a BM. Wife found him slumped over on the toilet unconscouis and he had passed brown stool and a bowlful of red blood. No associated abdominal pain.  No nausea or vomiting.  He is not on  any anticoagulants.  He does take a baby aspirin every day.  He has been on prednisone for about 1 year.  He is not on a PPI.  No history of GERD or PUD.  At home his appetite has been good, weight stable  ED / Admission workup notable for :   --Hgb 7.6 --normal MCV --Albumin 2.8,  Ca+ 8.5 --INR 1.2 --troponin 5.  --lipase 50 --FOBT+   Previous GI Evaluations  ? Colonoscopy 10 + years ago. Unable to find records.    Labs and Imaging: Recent Labs    03/19/23 0739  WBC 9.2  HGB 7.6*  HCT 24.8*  PLT 199   Recent Labs    03/19/23 0739  NA 138  K 4.3  CL 109  CO2 21*  GLUCOSE 191*  BUN 37*  CREATININE 1.25*  CALCIUM 8.5*   Recent Labs    03/19/23 0739  PROT 5.2*  ALBUMIN 2.8*  AST 17  ALT 11  ALKPHOS 30*  BILITOT 0.5   No results for input(s): "HEPBSAG", "HCVAB", "HEPAIGM", "HEPBIGM" in the last 72 hours. Recent Labs    03/19/23 0739  LABPROT  15.1  INR 1.2     Past Medical History:  Diagnosis Date   Anemia of chronic renal failure 05/08/2015   Aortic stenosis, severe 09/21/2015   Arthritis    Bladder stone    Chronic kidney disease (CKD), stage III (moderate) (HCC) 05/08/2015   Followed by Dr. 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 Herb Grays and Cornelius Moras   Type 2 diabetes mellitus (HCC)     Past Surgical History:  Procedure Laterality Date   COLONOSCOPY     CYSTOSCOPY/RETROGRADE/URETEROSCOPY/STONE EXTRACTION WITH BASKET N/A 03/22/2013   Procedure: CYSTOSCOPY BLADDER STONE STONE EXTRACTION;  Surgeon: Lindaann Slough, MD;  Location: [facility] [place];   Service: Urology;  Laterality: N/A;  CYSTOSCOPY     INTRAOPERATIVE TRANSTHORACIC ECHOCARDIOGRAM N/A 01/31/2020   Procedure: INTRAOPERATIVE TRANSTHORACIC ECHOCARDIOGRAM;  Surgeon: Kathleene Hazel, MD;  Location: [facility] OR;  Service: Open Heart Surgery;  Laterality: N/A;   RIGHT/LEFT HEART CATH AND CORONARY ANGIOGRAPHY N/A 01/12/2020   Procedure: RIGHT/LEFT HEART CATH AND CORONARY ANGIOGRAPHY;  Surgeon: Kathleene Hazel, MD;  Location: [facility] INVASIVE CV LAB;  Service: Cardiovascular;  Laterality: N/A;   TONSILLECTOMY     TRANSCATHETER AORTIC VALVE REPLACEMENT, TRANSFEMORAL N/A 01/31/2020   Procedure: TRANSCATHETER AORTIC VALVE REPLACEMENT, TRANSFEMORAL;  Surgeon: Kathleene Hazel, MD;  Location: [facility] OR;  Service: Open Heart Surgery;  Laterality: N/A;    Family History  Problem Relation Age of Onset   Hyperlipidemia Father    Congestive Heart Failure Other    Heart attack Other    Heart disease Other    Diabetes Brother     Prior to Admission medications   Medication Sig Start Date End Date Taking? Authorizing Provider  ACCU-CHEK GUIDE test strip USE UP TO 4 TIMES A DAY AS DIRECTED 03/06/19   Poulose, Percell Belt, NP  Accu-Chek Softclix Lancets lancets  09/01/19   [provider]  aspirin EC 81 MG tablet Take 81 mg by mouth daily.     [provider]  docusate sodium (DOK) 100 MG capsule Take 1 capsule (100 mg total) by mouth 2 (two) times daily as needed. 10/27/17   Kerman Passey, MD  finasteride (PROSCAR) 5 MG tablet Take 1 tablet (5 mg total) by mouth daily. 06/30/19   Danelle Berry, PA-C  folic acid (FOLVITE) 1 MG tablet Take 1 tablet by mouth daily. 10/17/20   [provider]  gabapentin (NEURONTIN) 100 MG capsule Take 100 mg by mouth at bedtime.    [provider]  hydroxychloroquine (PLAQUENIL) 200 MG tablet Take 1 tablet by mouth daily. 08/20/20   [provider]  insulin glargine-yfgn (SEMGLEE) 100 UNIT/ML Pen INJECT 10 UNITS  SUBCUTANEOUSLY IN THE MORNING AND INJECT 6 UNITS EVERY EVENING FOR DIABETES *REPLACES LEVEMIR* (USE WITHIN 28 DAYS AFTER OPENING PEN) 08/05/22   [provider]  Insulin Pen Needle (B-D UF III MINI PEN NEEDLES) 31G X 5 MM MISC USE AS DIRECTED WITH LEVEMIR INSULIN PENS AT BEDTIME, once a day 10/20/18   Kerman Passey, MD  lisinopril (ZESTRIL) 40 MG tablet TAKE ONE TABLET BY MOUTH DAILY FOR BLOOD PRESSURE 07/01/21   [provider]  Omega-3 Fatty Acids (FISH OIL) 1000 MG CAPS Take 1 capsule (1,000 mg total) by mouth daily. 12/04/17   Chilton Si, MD  predniSONE (DELTASONE) 5 MG tablet Take 1 tablet by mouth daily. 11/28/20   [provider]  tamsulosin (FLOMAX) 0.4 MG CAPS capsule TAKE 1 CAPSULE BY MOUTH EVERY DAY 11/03/19   Danelle Berry, PA-C    Current Facility-Administered Medications  Medication Dose Route Frequency Provider Last Rate Last Admin   sodium chloride 0.9 % bolus 500 mL  500 mL Intravenous Once Long, Arlyss Repress, MD       Current Outpatient Medications  Medication Sig Dispense Refill   ACCU-CHEK GUIDE test strip USE UP TO 4 TIMES A DAY AS DIRECTED 100 strip 0   Accu-Chek Softclix Lancets lancets      aspirin EC 81 MG tablet Take 81 mg by mouth daily.      docusate sodium (DOK) 100 MG capsule Take 1 capsule (100 mg total) by mouth 2 (two) times daily as needed. 100 capsule 5   finasteride (PROSCAR) 5 MG tablet Take 1 tablet (5 mg total) by mouth daily. 90 tablet 3   folic acid (FOLVITE) 1 MG tablet Take 1 tablet by mouth daily.     gabapentin (NEURONTIN) 100 MG capsule Take 100 mg by mouth at bedtime.     hydroxychloroquine (PLAQUENIL) 200 MG tablet Take 1 tablet by mouth daily.     insulin glargine-yfgn (SEMGLEE) 100 UNIT/ML Pen INJECT 10 UNITS SUBCUTANEOUSLY IN THE MORNING AND INJECT 6 UNITS EVERY EVENING FOR DIABETES *REPLACES LEVEMIR* (USE WITHIN 28 DAYS AFTER OPENING PEN)     Insulin Pen Needle (B-D UF III MINI PEN NEEDLES) 31G X 5 MM MISC USE AS  DIRECTED WITH LEVEMIR INSULIN PENS AT BEDTIME, once a day 100 each 3   lisinopril (ZESTRIL) 40 MG tablet TAKE ONE TABLET BY MOUTH DAILY FOR BLOOD PRESSURE     Omega-3 Fatty Acids (FISH OIL) 1000 MG CAPS Take 1 capsule (1,000 mg total) by mouth daily. 90 capsule 3   predniSONE (DELTASONE) 5 MG tablet Take 1 tablet by mouth daily.     tamsulosin (FLOMAX) 0.4 MG CAPS capsule TAKE 1 CAPSULE BY MOUTH EVERY DAY 30 capsule 5    Allergies as of 03/19/2023 - Review Complete 03/19/2023  Allergen Reaction Noted   Amlodipine  09/19/2022   Carvedilol Other (See Comments) 11/06/2015   Niaspan [niacin er]  05/08/2015   Shellfish allergy Other (See Comments) 07/18/2015   Statins Other (See Comments) 05/08/2015   Sulfa antibiotics  09/17/2015   Zetia [ezetimibe]  05/08/2015   Januvia [sitagliptin] Rash 11/04/2017    Social History   Socioeconomic History   Marital status: Married    Spouse name: [name]   Number of children: 4   Years of education: some college   Highest education level: 12th grade  Occupational History   Occupation: Proofreader  Tobacco Use   Smoking status: Former    Current packs/day: 0.00    Average packs/day: 0.8 packs/day for 39.0 years (29.3 ttl pk-yrs)    Types: Cigarettes    Start date: 03/21/1954    Quit date: 03/21/1993    Years since quitting: 30.0   Smokeless tobacco: Never   Tobacco comments:    smoking cessation materials not required  Vaping Use   Vaping status: Never Used  Substance and Sexual Activity  Alcohol use: No    Alcohol/week: 0.0 standard drinks of alcohol   Drug use: No   Sexual activity: Yes  Other Topics Concern   Not on file  Social History Narrative   Lives with wife in [place], [place].   Social Determinants of Health   Financial Resource Strain: Low Risk  (09/18/2020)   Overall Financial Resource Strain (CARDIA)    Difficulty of Paying Living Expenses: Not very hard  Food Insecurity: No Food Insecurity (09/18/2020)    Hunger Vital Sign    Worried About Running Out of Food in the Last Year: Never true    Ran Out of Food in the Last Year: Never true  Transportation Needs: No Transportation Needs (09/18/2020)   PRAPARE - Administrator, Civil Service (Medical): No    Lack of Transportation (Non-Medical): No  Physical Activity: Inactive (09/18/2020)   Exercise Vital Sign    Days of Exercise per Week: 0 days    Minutes of Exercise per Session: 0 min  Stress: No Stress Concern Present (09/18/2020)   [employer] of Occupational Health - Occupational Stress Questionnaire    Feeling of Stress : Not at all  Social Connections: Moderately Integrated (09/18/2020)   Social Connection and Isolation Panel [NHANES]    Frequency of Communication with Friends and Family: More than three times a week    Frequency of Social Gatherings with Friends and Family: More than three times a week    Attends Religious Services: More than 4 times per year    Active Member of [employer] or Organizations: No    Attends Banker Meetings: Never    Marital Status: Married  Catering manager Violence: Not At Risk (09/18/2020)   Humiliation, Afraid, Rape, and Kick questionnaire    Fear of Current or Ex-Partner: No    Emotionally Abused: No    Physically Abused: No    Sexually Abused: No     Code Status   Code Status: Prior  Review of Systems: All systems reviewed and negative except where noted in HPI.  Physical Exam: Vital signs in last 24 hours: Temp:  [98.2 F (36.8 C)-98.6 F (37 C)] 98.6 F (37 C) (07/25 1048) Pulse Rate:  [83-105] 91 (07/25 1230) Resp:  [17-22] 20 (07/25 1230) BP: (101-125)/(65-81) 101/80 (07/25 1230) SpO2:  [95 %-100 %] 95 % (07/25 1230)    General:  Pleasant thin male in NAD Psych:  Cooperative. Normal mood and affect Eyes: Pupils equal Ears:  Normal auditory acuity Nose: No deformity, discharge or lesions Neck:  Supple, no masses felt Lungs:  Clear to auscultation.   Heart:  Regular rate, regular rhythm.  Abdomen:  Soft, nondistended, nontender, active bowel sounds, no masses felt Rectal :  No masses felt. Soft stool mix with blood in vault Msk: Symmetrical without gross deformities.  Neurologic:  Alert, oriented, grossly normal neurologically Extremities : No edema Skin:  Intact without significant lesions.    Intake/Output from previous day: No intake/output data recorded. Intake/Output this shift:  No intake/output data recorded.  Principal Problem:   Syncope and collapse    Willette Cluster, NP-C @  03/19/2023, 1:30 PM

## 2023-03-19 NOTE — Progress Notes (Signed)
Patient was found in the bathroom with son, son assisted patient to the bathroom after patient refused to stay in the bed. Patient does have a history of dementia and was sundowning and becoming agitated and uncooperative with family with staff pulling off his tele monitor and attempting to pull out his IV while blood transfusion was running. After finding patient in the bathroom with son attempting to have a BM, patient eyes appeared glossy and patient began drooling from the mouth & became unresponsive, patient passed out and rapid response was called. Patient was placed back into bed by staff and vital signs were checked and WNL and charted in patient flow sheet. Patient returned back to his normal baseline but continued to be agitated with staff as we assisted him in the bed. MD was notified Dr Arn Medal of patient syncope episode which is patient current diagnosis and reason for being admitted into the hospital. Dr Arn Medal ordered a safety sister for patient behavior and ativan for patient behavior. Patient is in the bed with family at bedside with bed placed in lowest position. No other new orders needed at this time. Will continue plan of care for patient.

## 2023-03-19 NOTE — Progress Notes (Signed)
Patient was extremely agitated and irritable, trying to climb out of bed and pulling on lines,  and unable to follow command. Patient only oriented to self. Per family, the confusion and agitation is new.and is not baseline. Family reported that ativan made the patient more confused and more agitated. Family requested to see MD at bedside. [name], MD was notified via secure chat.  Lines were wrapped and hidden from patient. Sitter in the room. Bed alarm in place. Mittens will be applied.     Brian [name]

## 2023-03-19 NOTE — H&P (Signed)
History and Physical    Patient: Brian [name] JOA:[id number] DOB: 17-Sep-1933 DOA: 03/19/2023 DOS: the patient was seen and examined on 03/19/2023 PCP: Orvilla Cornwall, MD  Patient coming from: Home  Chief Complaint:  Chief Complaint  Patient presents with   Loss of Consciousness   HPI: Brian [name] is a 89 y.o. male with medical history significant of hypertension, hyperlipidemia, congestive heart failure, severe aortic stenosis s/p TAVR, CKD stage III, inflammatory arthritis on chronic steroids, diverticulosis, dementia who presents after having episode of loss of consciousness.  History is obtained from his wife present at bedside as the patient has dementia with short-term memory loss and is usually alert and oriented to person and place, but usually not time.  He has been in his normal state of health, but early this morning had complained of feeling unwell.  He had gotten up to use the restroom.  His wife reported hearing a noise and when she went to go check on him found him passed in front of the sink.  It was bright red blood in the toilet bowl.  He had not mentioned anything to her prior to this about having blood in stools.  He is on an aspirin but not on any other blood thinners.  Patient denies any complaints of chest pain, abdominal pain, nausea, vomiting, diarrhea, or dysuria symptoms.  In the emergency department patient was noted to be afebrile with mild tachycardia.  Labs significant for hemoglobin 7.6, BUN 37, creatinine 1.25, glucose 191, and INR 1.2.  Urinalysis positive for glucose.  Patient was typed and screened for possible need of blood products.  Patient was given 500 mL bolus of IV fluids.  Review of Systems: As mentioned in the history of present illness. All other systems reviewed and are negative. Past Medical History:  Diagnosis Date   Anemia of chronic renal failure 05/08/2015   Aortic stenosis, severe 09/21/2015   Arthritis    Bladder stone    Chronic  kidney disease (CKD), stage III (moderate) (HCC) 05/08/2015   Followed by Dr. 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 Herb Grays and Cornelius Moras   Type 2 diabetes mellitus (HCC)    Past Surgical History:  Procedure Laterality Date   COLONOSCOPY     CYSTOSCOPY/RETROGRADE/URETEROSCOPY/STONE EXTRACTION WITH BASKET N/A 03/22/2013   Procedure: CYSTOSCOPY BLADDER STONE STONE EXTRACTION;  Surgeon: Lindaann Slough, MD;  Location: [facility] [place];  Service: Urology;  Laterality: N/A;  CYSTOSCOPY     INTRAOPERATIVE TRANSTHORACIC ECHOCARDIOGRAM N/A 01/31/2020   Procedure: INTRAOPERATIVE TRANSTHORACIC ECHOCARDIOGRAM;  Surgeon: Kathleene Hazel, MD;  Location: [facility] OR;  Service: Open Heart Surgery;  Laterality: N/A;   RIGHT/LEFT HEART CATH AND CORONARY ANGIOGRAPHY N/A 01/12/2020   Procedure: RIGHT/LEFT HEART CATH AND CORONARY ANGIOGRAPHY;  Surgeon: Kathleene Hazel, MD;  Location: [facility] INVASIVE CV LAB;  Service: Cardiovascular;  Laterality: N/A;   TONSILLECTOMY     TRANSCATHETER AORTIC VALVE REPLACEMENT, TRANSFEMORAL N/A 01/31/2020   Procedure: TRANSCATHETER AORTIC VALVE REPLACEMENT, TRANSFEMORAL;  Surgeon: Kathleene Hazel, MD;  Location: [facility] OR;  Service: Open Heart Surgery;  Laterality: N/A;   Social History:  reports that he quit smoking about 30 years ago. His smoking use included cigarettes. He started smoking about 69 years ago. He has a 29.3 pack-year smoking history. He has never used smokeless tobacco. He reports that he does not drink alcohol and does not use drugs.  Allergies  Allergen  Reactions   Amlodipine    Carvedilol Other (See Comments)    bradycardia   Niaspan [Niacin Er]    Shellfish Allergy Other (See Comments)    Pt vomits   Statins Other (See Comments)    Myalgia   Sulfa Antibiotics    Zetia [Ezetimibe]    Januvia [Sitagliptin] Rash    Family History  Problem Relation Age of Onset   Hyperlipidemia Father    Congestive Heart Failure Other    Heart attack Other    Heart disease Other    Diabetes Brother     Prior to Admission medications   Medication Sig Start Date End Date Taking? Authorizing Provider  ACCU-CHEK GUIDE test strip USE UP TO 4 TIMES A DAY AS DIRECTED 03/06/19   Poulose, Percell Belt, NP  Accu-Chek Softclix Lancets lancets  09/01/19   [provider]  aspirin EC 81 MG tablet Take 81 mg by mouth daily.     [provider]  docusate sodium (DOK) 100 MG capsule Take 1 capsule (100 mg total) by mouth 2 (two) times daily as needed. 10/27/17   Kerman Passey, MD  finasteride (PROSCAR) 5 MG tablet Take 1 tablet (5 mg total) by mouth daily. 06/30/19   Danelle Berry, PA-C  folic acid (FOLVITE) 1 MG tablet Take 1 tablet by mouth daily. 10/17/20   [provider]  gabapentin (NEURONTIN) 100 MG capsule Take 100 mg by mouth at bedtime.    [provider]  hydroxychloroquine (PLAQUENIL) 200 MG tablet Take 1 tablet by mouth daily. 08/20/20   [provider]  insulin glargine-yfgn (SEMGLEE) 100 UNIT/ML Pen INJECT 10 UNITS SUBCUTANEOUSLY IN THE MORNING AND INJECT 6 UNITS EVERY EVENING FOR DIABETES *REPLACES LEVEMIR* (USE WITHIN 28 DAYS AFTER OPENING PEN) 08/05/22   [provider]  Insulin Pen Needle (B-D UF III MINI PEN NEEDLES) 31G X 5 MM MISC USE AS DIRECTED WITH LEVEMIR INSULIN PENS AT BEDTIME, once a day 10/20/18   [name], Janit Bern, MD  lisinopril (ZESTRIL) 40 MG tablet TAKE ONE TABLET BY MOUTH DAILY FOR BLOOD PRESSURE 07/01/21   [provider]  Omega-3 Fatty Acids (FISH OIL) 1000 MG CAPS Take 1  capsule (1,000 mg total) by mouth daily. 12/04/17   Chilton Si, MD  predniSONE (DELTASONE) 5 MG tablet Take 1 tablet by mouth daily. 11/28/20   [provider]  tamsulosin (FLOMAX) 0.4 MG CAPS capsule TAKE 1 CAPSULE BY MOUTH EVERY DAY 11/03/19   Danelle Berry, PA-C    Physical Exam: Vitals:   03/19/23 0738 03/19/23 1048 03/19/23 1130 03/19/23 1230  BP: 125/81 119/65 120/69 101/80  Pulse: (!) 105 83 85 91  Resp: 17 17 (!) 22 20  Temp: 98.2 F (36.8 C) 98.6 F (37 C)    TempSrc: Oral Oral    SpO2: 100% 100% 100% 95%  Height:         Constitutional: Elderly male in no acute distress Eyes: PERRL, lids and conjunctivae normal ENMT: Mucous membranes are moist.  Normal dentition.  Neck: normal, supple  Respiratory: clear to auscultation bilaterally, no wheezing, no  crackles. Normal respiratory effort. No accessory muscle use.  Cardiovascular: Regular rate and rhythm, no murmurs / rubs / gallops. No extremity edema. 2+ pedal pulses.   Abdomen: no tenderness, no masses palpated.  Bowel sounds positive.  Musculoskeletal: no clubbing / cyanosis. No joint deformity upper and lower extremities. Good ROM, no contractures. Normal muscle tone.  Skin: no rashes, lesions, ulcers.  Soft mobile mass in the middle of the back. Neurologic: CN 2-12 grossly intact.   Strength 5/5 in all 4.  Psychiatric: Poor short term memory.  Alert and oriented x person and place.  Normal mood.   Data Reviewed:  EKG reveals sinus rhythm at 95 bpm. Reviewed labs, imaging, and pertinent records as documented.  Assessment and Plan:  Syncope and collapse  Acute blood loss anemia secondary to GI bleed Acute.  Patient presents after having an episode of syncope while using the bathroom reported to have bright red blood in the toilet bowl.  Unclear if syncopal episode was close due to acute blood loss or vasovagal.  Hemoglobin 7.6, but previously had been 10.2 when last checked in 06/2022.  Patient with known  history of diverticulosis.  Denies any complaints of pain at this time.  Suspect possible diverticular bleed, but BUN was noted to be elevated to possibly give concern for upper source. -Admit to a medical telemetry bed as observation -Monitor intake and output -Clear liquid diet   -Hold aspirin -Transfuse 1 unit of packed red blood cells.  Goal hemoglobin greater than 8 g/dL given heart history -PPI -GI consulted, will follow-up for any further recommendations  Severe aortic stenosis s/p TAVR Patient underwent TAVR back in 12/2019  Essential hypertension Blood pressures were noted to be soft as low as 101/80. -Hold chlorthalidone and lisinopril.  Resume when medically appropriate  Diabetes mellitus type 2, with long-term use of insulin On admission glucose elevated at 191.  Home medication regimen includes Lantus 6  units daily -Hypoglycemic protocols -Pharmacy substitution of Semglee 6 units daily -CBGs before every meal with very sensitive SSI -Adjust insulin regimen as needed  Chronic kidney disease stage IIIa Acute.  Creatinine 1.25 with BUN 37 which appears around patient's baseline when looking on care everywhere records. -Continue to monitor  Inflammatory arthritis on chronic steroids Initially diagnosed by rheumatology at [facility] in 2021. -Continue prednisone and Plaquenil  Dementia, without behavioral disturbance -Delirium precautions  BPH -Continue Proscar and Flomax  DVT prophylaxis: SCDs Advance Care Planning:   Code Status: Full Code    Consults: Gastroenterology  Family Communication: Wife of the bedside  Severity of Illness: The appropriate patient status for this patient is OBSERVATION. Observation status is judged to be reasonable and necessary in order to provide the required intensity of service to ensure the patient's safety. The patient's presenting symptoms, physical exam findings, and initial radiographic and laboratory data in the context of their  medical condition is felt to place them at decreased risk for further clinical deterioration. Furthermore, it is anticipated that the patient will be medically stable for discharge from the hospital within 2 midnights of admission.   Author: Clydie Braun, MD 03/19/2023 12:56 PM  For on call review [email or web address].

## 2023-03-20 ENCOUNTER — Ambulatory Visit (HOSPITAL_BASED_OUTPATIENT_CLINIC_OR_DEPARTMENT_OTHER): Payer: Medicare PPO | Admitting: Cardiovascular Disease

## 2023-03-20 DIAGNOSIS — I129 Hypertensive chronic kidney disease with stage 1 through stage 4 chronic kidney disease, or unspecified chronic kidney disease: Secondary | ICD-10-CM | POA: Diagnosis not present

## 2023-03-20 DIAGNOSIS — Z7952 Long term (current) use of systemic steroids: Secondary | ICD-10-CM | POA: Diagnosis not present

## 2023-03-20 DIAGNOSIS — Z79899 Other long term (current) drug therapy: Secondary | ICD-10-CM | POA: Diagnosis not present

## 2023-03-20 DIAGNOSIS — Z833 Family history of diabetes mellitus: Secondary | ICD-10-CM | POA: Diagnosis not present

## 2023-03-20 DIAGNOSIS — I130 Hypertensive heart and chronic kidney disease with heart failure and stage 1 through stage 4 chronic kidney disease, or unspecified chronic kidney disease: Secondary | ICD-10-CM | POA: Diagnosis present

## 2023-03-20 DIAGNOSIS — E1122 Type 2 diabetes mellitus with diabetic chronic kidney disease: Secondary | ICD-10-CM | POA: Diagnosis present

## 2023-03-20 DIAGNOSIS — Z87891 Personal history of nicotine dependence: Secondary | ICD-10-CM | POA: Diagnosis not present

## 2023-03-20 DIAGNOSIS — M064 Inflammatory polyarthropathy: Secondary | ICD-10-CM | POA: Diagnosis present

## 2023-03-20 DIAGNOSIS — R55 Syncope and collapse: Secondary | ICD-10-CM | POA: Diagnosis present

## 2023-03-20 DIAGNOSIS — K5790 Diverticulosis of intestine, part unspecified, without perforation or abscess without bleeding: Secondary | ICD-10-CM | POA: Diagnosis present

## 2023-03-20 DIAGNOSIS — R000 Tachycardia, unspecified: Secondary | ICD-10-CM | POA: Diagnosis present

## 2023-03-20 DIAGNOSIS — K3189 Other diseases of stomach and duodenum: Secondary | ICD-10-CM | POA: Diagnosis present

## 2023-03-20 DIAGNOSIS — D62 Acute posthemorrhagic anemia: Secondary | ICD-10-CM | POA: Diagnosis present

## 2023-03-20 DIAGNOSIS — K264 Chronic or unspecified duodenal ulcer with hemorrhage: Secondary | ICD-10-CM | POA: Diagnosis present

## 2023-03-20 DIAGNOSIS — N400 Enlarged prostate without lower urinary tract symptoms: Secondary | ICD-10-CM | POA: Diagnosis present

## 2023-03-20 DIAGNOSIS — E785 Hyperlipidemia, unspecified: Secondary | ICD-10-CM | POA: Diagnosis present

## 2023-03-20 DIAGNOSIS — F03911 Unspecified dementia, unspecified severity, with agitation: Secondary | ICD-10-CM | POA: Diagnosis present

## 2023-03-20 DIAGNOSIS — Z7982 Long term (current) use of aspirin: Secondary | ICD-10-CM | POA: Diagnosis not present

## 2023-03-20 DIAGNOSIS — Z8249 Family history of ischemic heart disease and other diseases of the circulatory system: Secondary | ICD-10-CM | POA: Diagnosis not present

## 2023-03-20 DIAGNOSIS — Z794 Long term (current) use of insulin: Secondary | ICD-10-CM | POA: Diagnosis not present

## 2023-03-20 DIAGNOSIS — Z952 Presence of prosthetic heart valve: Secondary | ICD-10-CM | POA: Diagnosis not present

## 2023-03-20 DIAGNOSIS — D631 Anemia in chronic kidney disease: Secondary | ICD-10-CM | POA: Diagnosis present

## 2023-03-20 DIAGNOSIS — F0390 Unspecified dementia without behavioral disturbance: Secondary | ICD-10-CM | POA: Diagnosis not present

## 2023-03-20 DIAGNOSIS — K922 Gastrointestinal hemorrhage, unspecified: Secondary | ICD-10-CM | POA: Diagnosis not present

## 2023-03-20 DIAGNOSIS — F05 Delirium due to known physiological condition: Secondary | ICD-10-CM | POA: Diagnosis present

## 2023-03-20 DIAGNOSIS — N183 Chronic kidney disease, stage 3 unspecified: Secondary | ICD-10-CM | POA: Diagnosis not present

## 2023-03-20 DIAGNOSIS — N1831 Chronic kidney disease, stage 3a: Secondary | ICD-10-CM | POA: Diagnosis present

## 2023-03-20 DIAGNOSIS — Z83438 Family history of other disorder of lipoprotein metabolism and other lipidemia: Secondary | ICD-10-CM | POA: Diagnosis not present

## 2023-03-20 LAB — HEMOGLOBIN AND HEMATOCRIT, BLOOD
HCT: 23.4 % — ABNORMAL LOW (ref 39.0–52.0)
HCT: 24 % — ABNORMAL LOW (ref 39.0–52.0)
Hemoglobin: 8.1 g/dL — ABNORMAL LOW (ref 13.0–17.0)
Hemoglobin: 8.1 g/dL — ABNORMAL LOW (ref 13.0–17.0)

## 2023-03-20 LAB — GLUCOSE, CAPILLARY
Glucose-Capillary: 219 mg/dL — ABNORMAL HIGH (ref 70–99)
Glucose-Capillary: 225 mg/dL — ABNORMAL HIGH (ref 70–99)
Glucose-Capillary: 180 mg/dL — ABNORMAL HIGH (ref 70–99)
Glucose-Capillary: 171 mg/dL — ABNORMAL HIGH (ref 70–99)
Glucose-Capillary: 161 mg/dL — ABNORMAL HIGH (ref 70–99)

## 2023-03-20 LAB — BASIC METABOLIC PANEL: GFR, Estimated: 50 mL/min — ABNORMAL LOW (ref >60)

## 2023-03-20 LAB — PREPARE RBC (CROSSMATCH)

## 2023-03-20 MED ORDER — SODIUM CHLORIDE 0.9% IV SOLUTION: Freq: Once | INTRAVENOUS | Status: AC

## 2023-03-20 MED ORDER — PANTOPRAZOLE SODIUM 40 MG IV SOLR
40.0000 mg | Freq: Two times a day (BID) | INTRAVENOUS | Status: DC
Start: 2023-03-23 — End: 2023-03-20

## 2023-03-20 MED ORDER — FUROSEMIDE 10 MG/ML IJ SOLN
20.0000 mg | Freq: Once | INTRAMUSCULAR | Status: AC
  Administered 2023-03-20: 20 mg via INTRAVENOUS
  Filled 2023-03-20: qty 2

## 2023-03-20 MED ORDER — PANTOPRAZOLE SODIUM 40 MG IV SOLR: 40.0000 mg | Freq: Two times a day (BID) | INTRAVENOUS | Status: DC

## 2023-03-20 MED ORDER — PANTOPRAZOLE 80MG IVPB - SIMPLE MED
80.0000 mg | Freq: Once | INTRAVENOUS | Status: DC
Start: 2023-03-20 — End: 2023-03-20

## 2023-03-20 MED ORDER — PANTOPRAZOLE 80MG IVPB - SIMPLE MED
80.0000 mg | Freq: Once | INTRAVENOUS | Status: AC
  Administered 2023-03-20: 80 mg via INTRAVENOUS
  Filled 2023-03-20: qty 100

## 2023-03-20 MED ORDER — PANTOPRAZOLE INFUSION (NEW) - SIMPLE MED
8.0000 mg/h | INTRAVENOUS | Status: DC
  Administered 2023-03-20 – 2023-03-21 (×2): 8 mg/h via INTRAVENOUS
  Filled 2023-03-20 (×3): qty 100

## 2023-03-20 MED ORDER — PANTOPRAZOLE INFUSION (NEW) - SIMPLE MED
8.0000 mg/h | INTRAVENOUS | Status: DC
  Filled 2023-03-20: qty 100

## 2023-03-20 NOTE — Plan of Care (Signed)
  Problem: Fluid Volume: Goal: Ability to maintain a balanced intake and output will improve Outcome: Progressing   Problem: Metabolic: Goal: Ability to maintain appropriate glucose levels will improve Outcome: Progressing   Problem: Nutritional: Goal: Maintenance of adequate nutrition will improve Outcome: Progressing Goal: Progress toward achieving an optimal weight will improve Outcome: Progressing   Problem: Skin Integrity: Goal: Risk for impaired skin integrity will decrease Outcome: Progressing   Problem: Tissue Perfusion: Goal: Adequacy of tissue perfusion will improve Outcome: Progressing   Problem: Clinical Measurements: Goal: Ability to maintain clinical measurements within normal limits will improve Outcome: Progressing Goal: Will remain free from infection Outcome: Progressing Goal: Diagnostic test results will improve Outcome: Progressing Goal: Respiratory complications will improve Outcome: Progressing Goal: Cardiovascular complication will be avoided Outcome: Progressing   Problem: Activity: Goal: Risk for activity intolerance will decrease Outcome: Progressing   Problem: Nutrition: Goal: Adequate nutrition will be maintained Outcome: Progressing   Problem: Coping: Goal: Level of anxiety will decrease Outcome: Progressing   Problem: Pain Managment: Goal: General experience of comfort will improve Outcome: Progressing   Problem: Safety: Goal: Ability to remain free from injury will improve Outcome: Progressing   Problem: Skin Integrity: Goal: Risk for impaired skin integrity will decrease Outcome: Progressing

## 2023-03-20 NOTE — H&P (View-Only) (Signed)
Progress Note   LOS: 0 days   Chief Complaint:GI bleed   Subjective    Patient with family at bedside, daughter. Provided some of the history.   Patient had large bloody bowel movement overnight around 3 AM.  Hgb drawn at that time shows hemoglobin 7.3.  Patient received 1 unit PRBCs at 4 AM.  No redraw CBC has been completed yet.  Has not had any further bleeding since then.   Objective   Vital signs in last 24 hours: Temp:  [97.2 F (36.2 C)-99.6 F (37.6 C)] 99.6 F (37.6 C) (07/26 0805) Pulse Rate:  [82-124] 120 (07/26 0805) Resp:  [14-26] 16 (07/26 0447) BP: (93-153)/(50-80) 114/68 (07/26 0805) SpO2:  [95 %-100 %] 100 % (07/26 0805) Weight:  [58.8 kg-60.2 kg] 60.2 kg (07/26 0414) Last BM Date : 03/19/23 Last BM recorded by nurses in past 5 days Stool Type: Type 6 (Mushy consistency with ragged edges) (no ragged edges.dark red blood mush. one small solid piece,) (03/20/2023 10:12 AM)  General:   male in no acute distress  Heart:  Regular rate and rhythm; no murmurs Pulm: Clear anteriorly; no wheezing Abdomen: soft, nondistended, normal bowel sounds in all quadrants. Nontender without guarding. No organomegaly appreciated. Extremities:  No edema Neurologic: Disoriented, baseline Psych:  Cooperative. Normal mood and affect.  Intake/Output from previous day: 07/25 0701 - 07/26 0700 In: 386.8 [Blood:386.8] Out: 300 [Urine:300] Intake/Output this shift: No intake/output data recorded.  Studies/Results: CT ANGIO GI BLEED  Result Date: 03/19/2023 CLINICAL DATA:  Mesenteric ischemia, acute. Per triage note, bright red blood per rectum and syncopal episode this morning. EXAM: CTA ABDOMEN AND PELVIS WITHOUT AND WITH CONTRAST TECHNIQUE: Multidetector CT imaging of the abdomen and pelvis was performed using the standard protocol during bolus administration of intravenous contrast. Multiplanar reconstructed images and MIPs were obtained and reviewed to evaluate the vascular  anatomy. RADIATION DOSE REDUCTION: This exam was performed according to the departmental dose-optimization program which includes automated exposure control, adjustment of the mA and/or kV according to patient size and/or use of iterative reconstruction technique. CONTRAST:  OMNIPAQUE IOHEXOL 350 MG/ML SOLN COMPARISON:  CTA chest/abdomen/pelvis 01/18/2020. FINDINGS: VASCULAR Aorta: Normal caliber aorta without aneurysm, dissection, vasculitis or significant stenosis. Extensive atherosclerotic vascular changes. Celiac: Patent without evidence of aneurysm, dissection, vasculitis or significant stenosis. SMA: Patent without evidence of aneurysm, dissection, vasculitis or significant stenosis. Renals: Both renal arteries are patent without evidence of aneurysm, dissection, vasculitis, fibromuscular dysplasia or significant stenosis. Mild atherosclerotic vascular changes at the origins. IMA: Patent without evidence of aneurysm, dissection, vasculitis or significant stenosis. Inflow: Patent without evidence of aneurysm, dissection, vasculitis or significant stenosis. Moderate atherosclerotic vascular changes. Proximal Outflow: Bilateral common femoral and visualized portions of the superficial and profunda femoral arteries are patent without evidence of aneurysm, dissection, vasculitis or significant stenosis. Moderate atherosclerotic vascular changes. Veins: Patent. Review of the MIP images confirms the above findings. NON-VASCULAR Lower chest: No acute abnormality.  Coronary artery calcifications. Hepatobiliary: No focal liver abnormality is seen. No gallstones, gallbladder wall thickening, or biliary dilatation. Pancreas: Unremarkable. No pancreatic ductal dilatation or surrounding inflammatory changes. Spleen: Normal. Adrenals/Urinary Tract: The adrenal glands are unremarkable. Punctate nonobstructing renal calculi in the upper pole right kidney (axial image 18 series 5). No hydronephrosis. Periureteral  diverticulum of the left posterior bladder wall. Stomach/Bowel: Normal stomach and duodenum. No dilated loops of small bowel. Normal appendix is visualized on axial image 89 series 17. Extensive colonic diverticulosis. No surrounding inflammation to  suggest acute diverticulitis. No evidence of active extravasation of contrast to localize the patient's reported lower GI bleeding. Lymphatic: No abdominal or pelvic lymphadenopathy. Reproductive: Prostate is unremarkable. Other: No abdominal wall hernia or abnormality. No abdominopelvic ascites. Musculoskeletal: Degenerative changes of the right-greater-than-left hip joints. No acute bone findings. IMPRESSION: 1. No evidence of active extravasation of contrast to localize the patient's reported lower GI bleeding. No evidence of mesenteric ischemia. 2. Extensive colonic diverticulosis without evidence of acute diverticulitis. 3. Punctate nonobstructing renal calculi in the upper pole right kidney. No hydronephrosis. 4. Coronary atherosclerosis. Aortic Atherosclerosis (ICD10-I70.0). Electronically Signed   By: Orvan Falconer M.D.   On: 03/19/2023 10:39    Lab Results: Recent Labs    03/19/23 0739 03/19/23 2306 03/20/23 0314  WBC 9.2  --  10.4  HGB 7.6* 8.2* 7.3*  HCT 24.8* 24.9* 22.0*  PLT 199  --  157   BMET Recent Labs    03/19/23 0739 03/20/23 0314  NA 138 138  K 4.3 4.0  CL 109 109  CO2 21* 20*  GLUCOSE 191* 247*  BUN 37* 53*  CREATININE 1.25* 1.37*  CALCIUM 8.5* 8.3*   LFT Recent Labs    03/19/23 0739  PROT 5.2*  ALBUMIN 2.8*  AST 17  ALT 11  ALKPHOS 30*  BILITOT 0.5   PT/INR Recent Labs    03/19/23 0739  LABPROT 15.1  INR 1.2     Scheduled Meds:  finasteride  5 mg Oral Daily   gabapentin  100 mg Oral QHS   insulin aspart  0-6 Units Subcutaneous TID WC   insulin glargine-yfgn  6 Units Subcutaneous Daily   pantoprazole  40 mg Oral Daily   predniSONE  5 mg Oral Daily   tamsulosin  0.4 mg Oral Daily   Continuous  Infusions:    Impression/Plan   89 year old male history of severe aortic stenosis s/p TAVR, diverticulosis, admitted after a syncopal episode with large bloody bowel movement.  CTA negative on admission.  Recent new history of sundowning per daughter.  Hgb 7.6 on admission (hgb 10 - May 2023)  GI bleed, suspected diverticular --Hgb currently stable.  Recheck Hgb s/p transfusion early this morning. --If if multiple bloody bowel movements in a short timeframe would recommend possible repeat stat CTA with possible IR embolization --Increased risk of colonoscopy from a sedation standpoint given age and comorbidities. Prep would also be a challenge with history of sundowning. Will continue observation with conservative management --Continue daily CBC and transfuse as needed to maintain HGB > 7  -- Continue supportive care   ADDENDUM 10:49AM After I left the room, patient had a melanotic stool. BUN increased to 53 from 37. Concerned for Upper GI bleed at this time. Suspect his bright red bowel movements could've been secondary to brisk upper GI bleed over diverticular.  - Start PPI drip - If anesthesia allows, will put him on for EGD today. - continue to trend CBC and CMP  Hazyl Marseille M Emmilynn Marut  03/20/2023, 10:24 AM

## 2023-03-20 NOTE — Inpatient Diabetes Management (Signed)
Inpatient Diabetes Program Recommendations  AACE/ADA: New Consensus Statement on Inpatient Glycemic Control (2015)  Target Ranges:  Prepandial:   less than 140 mg/dL      Peak postprandial:   less than 180 mg/dL (1-2 hours)      Critically ill patients:  140 - 180 mg/dL   Lab Results  Component Value Date   GLUCAP 225 (H) 03/20/2023   HGBA1C 8.0 (H) 03/19/2023    Review of Glycemic Control  Latest Reference Range & Units 03/19/23 16:42 03/19/23 18:25 03/19/23 21:03 03/20/23 06:15 03/20/23 08:02  Glucose-Capillary 70 - 99 mg/dL 161 (H) 096 (H) 045 (H) 219 (H) 225 (H)  (H): Data is abnormally high  Diabetes history: DM2 Outpatient Diabetes medications: Lantus 6 units QD Current orders for Inpatient glycemic control: Semglee 6 units every day, Prednisone 5 mg QD  Inpatient Diabetes Program Recommendations:    Please consider, Novolog 0-9 units TID.  Will continue to follow while inpatient.  Thank you, Dulce Sellar, MSN, CDCES Diabetes Coordinator Inpatient Diabetes Program [phone] (team pager from 8a-5p)

## 2023-03-20 NOTE — Progress Notes (Signed)
Patient had bright red BM this morning. [name], MD was notified. MEWS score still yellow.   Brian [name]

## 2023-03-20 NOTE — Progress Notes (Addendum)
Progress Note   LOS: 0 days   Chief Complaint:GI bleed   Subjective    Patient with family at bedside, daughter. Provided some of the history.   Patient had large bloody bowel movement overnight around 3 AM.  Hgb drawn at that time shows hemoglobin 7.3.  Patient received 1 unit PRBCs at 4 AM.  No redraw CBC has been completed yet.  Has not had any further bleeding since then.   Objective   Vital signs in last 24 hours: Temp:  [97.2 F (36.2 C)-99.6 F (37.6 C)] 99.6 F (37.6 C) (07/26 0805) Pulse Rate:  [82-124] 120 (07/26 0805) Resp:  [14-26] 16 (07/26 0447) BP: (93-153)/(50-80) 114/68 (07/26 0805) SpO2:  [95 %-100 %] 100 % (07/26 0805) Weight:  [58.8 kg-60.2 kg] 60.2 kg (07/26 0414) Last BM Date : 03/19/23 Last BM recorded by nurses in past 5 days Stool Type: Type 6 (Mushy consistency with ragged edges) (no ragged edges.dark red blood mush. one small solid piece,) (03/20/2023 10:12 AM)  General:   male in no acute distress  Heart:  Regular rate and rhythm; no murmurs Pulm: Clear anteriorly; no wheezing Abdomen: soft, nondistended, normal bowel sounds in all quadrants. Nontender without guarding. No organomegaly appreciated. Extremities:  No edema Neurologic: Disoriented, baseline Psych:  Cooperative. Normal mood and affect.  Intake/Output from previous day: 07/25 0701 - 07/26 0700 In: 386.8 [Blood:386.8] Out: 300 [Urine:300] Intake/Output this shift: No intake/output data recorded.  Studies/Results: CT ANGIO GI BLEED  Result Date: 03/19/2023 CLINICAL DATA:  Mesenteric ischemia, acute. Per triage note, bright red blood per rectum and syncopal episode this morning. EXAM: CTA ABDOMEN AND PELVIS WITHOUT AND WITH CONTRAST TECHNIQUE: Multidetector CT imaging of the abdomen and pelvis was performed using the standard protocol during bolus administration of intravenous contrast. Multiplanar reconstructed images and MIPs were obtained and reviewed to evaluate the vascular  anatomy. RADIATION DOSE REDUCTION: This exam was performed according to the departmental dose-optimization program which includes automated exposure control, adjustment of the mA and/or kV according to patient size and/or use of iterative reconstruction technique. CONTRAST:  OMNIPAQUE IOHEXOL 350 MG/ML SOLN COMPARISON:  CTA chest/abdomen/pelvis 01/18/2020. FINDINGS: VASCULAR Aorta: Normal caliber aorta without aneurysm, dissection, vasculitis or significant stenosis. Extensive atherosclerotic vascular changes. Celiac: Patent without evidence of aneurysm, dissection, vasculitis or significant stenosis. SMA: Patent without evidence of aneurysm, dissection, vasculitis or significant stenosis. Renals: Both renal arteries are patent without evidence of aneurysm, dissection, vasculitis, fibromuscular dysplasia or significant stenosis. Mild atherosclerotic vascular changes at the origins. IMA: Patent without evidence of aneurysm, dissection, vasculitis or significant stenosis. Inflow: Patent without evidence of aneurysm, dissection, vasculitis or significant stenosis. Moderate atherosclerotic vascular changes. Proximal Outflow: Bilateral common femoral and visualized portions of the superficial and profunda femoral arteries are patent without evidence of aneurysm, dissection, vasculitis or significant stenosis. Moderate atherosclerotic vascular changes. Veins: Patent. Review of the MIP images confirms the above findings. NON-VASCULAR Lower chest: No acute abnormality.  Coronary artery calcifications. Hepatobiliary: No focal liver abnormality is seen. No gallstones, gallbladder wall thickening, or biliary dilatation. Pancreas: Unremarkable. No pancreatic ductal dilatation or surrounding inflammatory changes. Spleen: Normal. Adrenals/Urinary Tract: The adrenal glands are unremarkable. Punctate nonobstructing renal calculi in the upper pole right kidney (axial image 18 series 5). No hydronephrosis. Periureteral  diverticulum of the left posterior bladder wall. Stomach/Bowel: Normal stomach and duodenum. No dilated loops of small bowel. Normal appendix is visualized on axial image 89 series 17. Extensive colonic diverticulosis. No surrounding inflammation to  suggest acute diverticulitis. No evidence of active extravasation of contrast to localize the patient's reported lower GI bleeding. Lymphatic: No abdominal or pelvic lymphadenopathy. Reproductive: Prostate is unremarkable. Other: No abdominal wall hernia or abnormality. No abdominopelvic ascites. Musculoskeletal: Degenerative changes of the right-greater-than-left hip joints. No acute bone findings. IMPRESSION: 1. No evidence of active extravasation of contrast to localize the patient's reported lower GI bleeding. No evidence of mesenteric ischemia. 2. Extensive colonic diverticulosis without evidence of acute diverticulitis. 3. Punctate nonobstructing renal calculi in the upper pole right kidney. No hydronephrosis. 4. Coronary atherosclerosis. Aortic Atherosclerosis (ICD10-I70.0). Electronically Signed   By: Orvan Falconer M.D.   On: 03/19/2023 10:39    Lab Results: Recent Labs    03/19/23 0739 03/19/23 2306 03/20/23 0314  WBC 9.2  --  10.4  HGB 7.6* 8.2* 7.3*  HCT 24.8* 24.9* 22.0*  PLT 199  --  157   BMET Recent Labs    03/19/23 0739 03/20/23 0314  NA 138 138  K 4.3 4.0  CL 109 109  CO2 21* 20*  GLUCOSE 191* 247*  BUN 37* 53*  CREATININE 1.25* 1.37*  CALCIUM 8.5* 8.3*   LFT Recent Labs    03/19/23 0739  PROT 5.2*  ALBUMIN 2.8*  AST 17  ALT 11  ALKPHOS 30*  BILITOT 0.5   PT/INR Recent Labs    03/19/23 0739  LABPROT 15.1  INR 1.2     Scheduled Meds:  finasteride  5 mg Oral Daily   gabapentin  100 mg Oral QHS   insulin aspart  0-6 Units Subcutaneous TID WC   insulin glargine-yfgn  6 Units Subcutaneous Daily   pantoprazole  40 mg Oral Daily   predniSONE  5 mg Oral Daily   tamsulosin  0.4 mg Oral Daily   Continuous  Infusions:    Impression/Plan   89 year old male history of severe aortic stenosis s/p TAVR, diverticulosis, admitted after a syncopal episode with large bloody bowel movement.  CTA negative on admission.  Recent new history of sundowning per daughter.  Hgb 7.6 on admission (hgb 10 - May 2023)  GI bleed, suspected diverticular --Hgb currently stable.  Recheck Hgb s/p transfusion early this morning. --If if multiple bloody bowel movements in a short timeframe would recommend possible repeat stat CTA with possible IR embolization --Increased risk of colonoscopy from a sedation standpoint given age and comorbidities. Prep would also be a challenge with history of sundowning. Will continue observation with conservative management --Continue daily CBC and transfuse as needed to maintain HGB > 7  -- Continue supportive care   ADDENDUM 10:49AM After I left the room, patient had a melanotic stool. BUN increased to 53 from 37. Concerned for Upper GI bleed at this time. Suspect his bright red bowel movements could've been secondary to brisk upper GI bleed over diverticular.  - Start PPI drip - If anesthesia allows, will put him on for EGD today. - continue to trend CBC and CMP   M   03/20/2023, 10:24 AM

## 2023-03-20 NOTE — Progress Notes (Signed)
Patient scored yellow MEWS at 2306, with HR as high as 130s on the tele monitor on 03/19/2023. VS recheck and MEWS green currently. BP soft at 93/50. [name], MD was notified. [name], RN charge nurse was also made aware.  Brian [name]

## 2023-03-20 NOTE — Progress Notes (Signed)
Transition of Care [facility]) - Inpatient Brief Assessment   Patient Details  Name: Brian [name] MRN: [id number] Date of Birth: 01-Oct-1933  [facility]) CM/SW Contact:    Janae Bridgeman, RN Phone Number: 03/20/2023, 12:39 PM   Clinical Narrative: Patient admitted for Syncope and collapse at home.  The patient lives at home with his wife.  Wife states that has RW, Glucometer in the home.  Patient is active with [place], [place].  Patient states that patient has early sign of Alzheimer's disease - resources provided in the AVS.  No TOC needs at this time.  Patient's wife and daughter are present at the bedside and provided with the Medicare Observation notice.   Transition of Care Asessment: Insurance and Status: (P) Insurance coverage has been reviewed Patient has primary care physician: (P) Yes Home environment has been reviewed: (P) Yes - lives at home with wife Prior level of function:: (P) Independent Prior/Current Home Services: (P) No current home services Social Determinants of Health Reivew: (P) SDOH reviewed no interventions necessary Readmission risk has been reviewed: (P) Yes Transition of care needs: (P) no transition of care needs at this time

## 2023-03-20 NOTE — Care Management Obs Status (Cosign Needed)
MEDICARE OBSERVATION STATUS NOTIFICATION   Patient Details  Name: JAEMON POLINSKY MRN: [id number] Date of Birth: May 24, 1934   Medicare Observation Status Notification Given:  Yes    Janae Bridgeman, RN 03/20/2023, 12:20 PM

## 2023-03-20 NOTE — Progress Notes (Signed)
Patient had another large BM that's very bright red. Daughter is concerned and wants immediate intervention. [name], MD was notified.  Tannen Vandezande

## 2023-03-20 NOTE — Progress Notes (Signed)
PROGRESS NOTE    PAO GHAN  FAO:[id number] DOB: Aug 24, 1934 DOA: 03/19/2023 PCP: Orvilla Cornwall, MD  Chief Complaint  Patient presents with   Loss of Consciousness    Brief Narrative:   Brian [name] is Brian [name] 89 y.o. male with medical history significant of hypertension, hyperlipidemia, congestive heart failure, severe aortic stenosis s/p TAVR, CKD stage III, inflammatory arthritis on chronic steroids, diverticulosis, dementia who presents after having episode of loss of consciousness.  History is obtained from his wife present at bedside as the patient has dementia with short-term memory loss and is usually alert and oriented to person and place, but usually not time.  He has been in his normal state of health, but early this morning had complained of feeling unwell.  He had gotten up to use the restroom.  His wife reported hearing Brian [name] noise and when she went to go check on him found him passed in front of the sink.  It was bright red blood in the toilet bowl.  He had not mentioned anything to her prior to this about having blood in stools.  He is on an aspirin but not on any other blood thinners.  Patient denies any complaints of chest pain, abdominal pain, nausea, vomiting, diarrhea, or dysuria symptoms.   In the emergency department patient was noted to be afebrile with mild tachycardia.  Labs significant for hemoglobin 7.6, BUN 37, creatinine 1.25, glucose 191, and INR 1.2.  Urinalysis positive for glucose.  Patient was typed and screened for possible need of blood products.  Patient was given 500 mL bolus of IV fluids.    Assessment & Plan:   Principal Problem:   Syncope and collapse Active Problems:   Acute blood loss anemia   GI bleed   S/P TAVR (transcatheter aortic valve replacement)   Essential hypertension   Type 2 diabetes, controlled, with renal manifestation (HCC)   Chronic kidney disease (CKD), stage III (moderate) (HCC)   Dementia without behavioral disturbance  (HCC)   BPH (benign prostatic hyperplasia)  Syncope and collapse  Acute blood loss anemia secondary to GI bleed Melena  - daughter showed me picture this morning concerning for melena - PPI gtt -Hold aspirin -3 units pRBC have been ordered - pending -GI consulted, will follow-up for any further recommendations - considering EGD   Severe aortic stenosis s/p TAVR Patient underwent TAVR back in 12/2019   Essential hypertension Blood pressures were noted to be soft as low as 101/80. -Hold chlorthalidone and lisinopril.  Resume when medically appropriate   Diabetes mellitus type 2, with long-term use of insulin On admission glucose elevated at 191.  Home medication regimen includes Lantus 6  units daily -Pharmacy substitution of Semglee 6 units daily -CBGs before every meal with very sensitive SSI -Adjust insulin regimen as needed   Chronic kidney disease stage IIIa Acute.  Creatinine 1.25 with BUN 37 which appears around patient's baseline when looking on care everywhere records. 1.37 today, follow after transfusion   Inflammatory arthritis on chronic steroids Initially diagnosed by rheumatology at [facility] in 2021. -Continue prednisone and Plaquenil   Dementia, without behavioral disturbance -Delirium precautions   BPH -Continue Proscar and Flomax   DVT prophylaxis: SCD Code Status: full Family Communication: daughter at bedsdie Disposition:   Status is: Observation The patient will require care spanning > 2 midnights and should be moved to inpatient because: need for inpatient care   Consultants:  GI  Procedures:  none  Antimicrobials:  Anti-infectives (From admission,  onward)    None       Subjective: Sleeping Discussed with daughter  Objective: Vitals:   03/20/23 0805 03/20/23 0830 03/20/23 1059 03/20/23 1115  BP: 114/68  (!) 105/57 (!) 116/55  Pulse: (!) 120  (!) 110 (!) 109  Resp:  18 16 18   Temp: 99.6 F (37.6 C) 98.6 F (37 C) 98.8 F (37.1 C)  98.3 F (36.8 C)  TempSrc: Oral Axillary Axillary Axillary  SpO2: 100%  100% 100%  Weight:      Height:        Intake/Output Summary (Last 24 hours) at 03/20/2023 1412 Last data filed at 03/20/2023 0413 Gross per 24 hour  Intake 386.75 ml  Output 300 ml  Net 86.75 ml   Filed Weights   03/19/23 1422 03/20/23 0414  Weight: 58.8 kg 60.2 kg    Examination:  General exam: Appears calm and comfortable  Respiratory system: unlabored Cardiovascular system: RRR Gastrointestinal system: Abdomen is nondistended, soft and nontender. Central nervous system: asleep. No focal neurological deficits. Extremities: no LEE    Data Reviewed: I have personally reviewed following labs and imaging studies  CBC: Recent Labs  Lab 03/19/23 0739 03/19/23 2306 03/20/23 0314  WBC 9.2  --  10.4  NEUTROABS 6.7  --   --   HGB 7.6* 8.2* 7.3*  HCT 24.8* 24.9* 22.0*  MCV 90.5  --  86.3  PLT 199  --  157    Basic Metabolic Panel: Recent Labs  Lab 03/19/23 0739 03/20/23 0314  NA 138 138  K 4.3 4.0  CL 109 109  CO2 21* 20*  GLUCOSE 191* 247*  BUN 37* 53*  CREATININE 1.25* 1.37*  CALCIUM 8.5* 8.3*    GFR: Estimated Creatinine Clearance: 32.3 mL/min (Brian [name]) (by C-G formula based on SCr of 1.37 mg/dL (H)).  Liver Function Tests: Recent Labs  Lab 03/19/23 0739  AST 17  ALT 11  ALKPHOS 30*  BILITOT 0.5  PROT 5.2*  ALBUMIN 2.8*    CBG: Recent Labs  Lab 03/19/23 1825 03/19/23 2103 03/20/23 0615 03/20/23 0802 03/20/23 1125  GLUCAP 219* 190* 219* 225* 180*     No results found for this or any previous visit (from the past 240 hour(s)).       Radiology Studies: CT ANGIO GI BLEED  Result Date: 03/19/2023 CLINICAL DATA:  Mesenteric ischemia, acute. Per triage note, bright red blood per rectum and syncopal episode this morning. EXAM: CTA ABDOMEN AND PELVIS WITHOUT AND WITH CONTRAST TECHNIQUE: Multidetector CT imaging of the abdomen and pelvis was performed using the standard  protocol during bolus administration of intravenous contrast. Multiplanar reconstructed images and MIPs were obtained and reviewed to evaluate the vascular anatomy. RADIATION DOSE REDUCTION: This exam was performed according to the departmental dose-optimization program which includes automated exposure control, adjustment of the mA and/or kV according to patient size and/or use of iterative reconstruction technique. CONTRAST:  OMNIPAQUE IOHEXOL 350 MG/ML SOLN COMPARISON:  CTA chest/abdomen/pelvis 01/18/2020. FINDINGS: VASCULAR Aorta: Normal caliber aorta without aneurysm, dissection, vasculitis or significant stenosis. Extensive atherosclerotic vascular changes. Celiac: Patent without evidence of aneurysm, dissection, vasculitis or significant stenosis. SMA: Patent without evidence of aneurysm, dissection, vasculitis or significant stenosis. Renals: Both renal arteries are patent without evidence of aneurysm, dissection, vasculitis, fibromuscular dysplasia or significant stenosis. Mild atherosclerotic vascular changes at the origins. IMA: Patent without evidence of aneurysm, dissection, vasculitis or significant stenosis. Inflow: Patent without evidence of aneurysm, dissection, vasculitis or significant stenosis. Moderate atherosclerotic vascular changes.  Proximal Outflow: Bilateral common femoral and visualized portions of the superficial and profunda femoral arteries are patent without evidence of aneurysm, dissection, vasculitis or significant stenosis. Moderate atherosclerotic vascular changes. Veins: Patent. Review of the MIP images confirms the above findings. NON-VASCULAR Lower chest: No acute abnormality.  Coronary artery calcifications. Hepatobiliary: No focal liver abnormality is seen. No gallstones, gallbladder wall thickening, or biliary dilatation. Pancreas: Unremarkable. No pancreatic ductal dilatation or surrounding inflammatory changes. Spleen: Normal. Adrenals/Urinary Tract: The adrenal  glands are unremarkable. Punctate nonobstructing renal calculi in the upper pole right kidney (axial image 18 series 5). No hydronephrosis. Periureteral diverticulum of the left posterior bladder wall. Stomach/Bowel: Normal stomach and duodenum. No dilated loops of small bowel. Normal appendix is visualized on axial image 89 series 17. Extensive colonic diverticulosis. No surrounding inflammation to suggest acute diverticulitis. No evidence of active extravasation of contrast to localize the patient's reported lower GI bleeding. Lymphatic: No abdominal or pelvic lymphadenopathy. Reproductive: Prostate is unremarkable. Other: No abdominal wall hernia or abnormality. No abdominopelvic ascites. Musculoskeletal: Degenerative changes of the right-greater-than-left hip joints. No acute bone findings. IMPRESSION: 1. No evidence of active extravasation of contrast to localize the patient's reported lower GI bleeding. No evidence of mesenteric ischemia. 2. Extensive colonic diverticulosis without evidence of acute diverticulitis. 3. Punctate nonobstructing renal calculi in the upper pole right kidney. No hydronephrosis. 4. Coronary atherosclerosis. Aortic Atherosclerosis (ICD10-I70.0). Electronically Signed   By: Orvan Falconer M.D.   On: 03/19/2023 10:39        Scheduled Meds:  finasteride  5 mg Oral Daily   gabapentin  100 mg Oral QHS   insulin aspart  0-6 Units Subcutaneous TID WC   insulin glargine-yfgn  6 Units Subcutaneous Daily   [START ON 03/23/2023] pantoprazole  40 mg Intravenous Q12H   predniSONE  5 mg Oral Daily   tamsulosin  0.4 mg Oral Daily   Continuous Infusions:  pantoprazole 8 mg/hr (03/20/23 1212)     LOS: 0 days    Time spent: over 30 min     Lacretia Nicks, MD [facility]   To contact the attending provider between 7A-7P or the covering provider during after hours 7P-7A, please log into the web site [email or web address] and access using universal [place] password for  that web site. If you do not have the password, please call the hospital operator.  03/20/2023, 2:12 PM

## 2023-03-21 ENCOUNTER — Inpatient Hospital Stay (HOSPITAL_COMMUNITY): Payer: No Typology Code available for payment source | Admitting: Critical Care Medicine

## 2023-03-21 ENCOUNTER — Encounter (HOSPITAL_COMMUNITY)
Admission: EM | Disposition: A | Discharge status: Home Health | Payer: Self-pay | Source: Home / Self Care | Attending: Family Medicine

## 2023-03-21 ENCOUNTER — Encounter (HOSPITAL_COMMUNITY): Payer: Self-pay | Admitting: Family Medicine

## 2023-03-21 DIAGNOSIS — Z87891 Personal history of nicotine dependence: Secondary | ICD-10-CM | POA: Diagnosis not present

## 2023-03-21 DIAGNOSIS — N183 Chronic kidney disease, stage 3 unspecified: Secondary | ICD-10-CM | POA: Diagnosis not present

## 2023-03-21 DIAGNOSIS — I129 Hypertensive chronic kidney disease with stage 1 through stage 4 chronic kidney disease, or unspecified chronic kidney disease: Secondary | ICD-10-CM

## 2023-03-21 DIAGNOSIS — K264 Chronic or unspecified duodenal ulcer with hemorrhage: Secondary | ICD-10-CM | POA: Diagnosis not present

## 2023-03-21 DIAGNOSIS — R55 Syncope and collapse: Secondary | ICD-10-CM | POA: Diagnosis not present

## 2023-03-21 HISTORY — PX: ESOPHAGOGASTRODUODENOSCOPY: SHX5428

## 2023-03-21 HISTORY — PX: HOT HEMOSTASIS: SHX5433

## 2023-03-21 HISTORY — PX: BIOPSY: SHX5522

## 2023-03-21 HISTORY — PX: HEMOSTASIS CLIP PLACEMENT: SHX6857

## 2023-03-21 LAB — GLUCOSE, CAPILLARY
Glucose-Capillary: 195 mg/dL — ABNORMAL HIGH (ref 70–99)
Glucose-Capillary: 172 mg/dL — ABNORMAL HIGH (ref 70–99)
Glucose-Capillary: 200 mg/dL — ABNORMAL HIGH (ref 70–99)
Glucose-Capillary: 270 mg/dL — ABNORMAL HIGH (ref 70–99)

## 2023-03-21 LAB — URINALYSIS, ROUTINE W REFLEX MICROSCOPIC
Bilirubin Urine: NEGATIVE
Glucose, UA: 50 mg/dL — AB
Hgb urine dipstick: NEGATIVE
Ketones, ur: NEGATIVE mg/dL
Leukocytes,Ua: NEGATIVE
Nitrite: NEGATIVE
Protein, ur: NEGATIVE mg/dL
Specific Gravity, Urine: 1.018 (ref 1.005–1.030)
pH: 5 (ref 5.0–8.0)

## 2023-03-21 LAB — POCT I-STAT, CHEM 8
BUN: 53 mg/dL — ABNORMAL HIGH (ref 8–23)
Calcium, Ion: 1.22 mmol/L (ref 1.15–1.40)
Chloride: 111 mmol/L (ref 98–111)
Creatinine, Ser: 2.1 mg/dL — ABNORMAL HIGH (ref 0.61–1.24)
Glucose, Bld: 177 mg/dL — ABNORMAL HIGH (ref 70–99)
HCT: 22 % — ABNORMAL LOW (ref 39.0–52.0)
Hemoglobin: 7.5 g/dL — ABNORMAL LOW (ref 13.0–17.0)
Potassium: 4.1 mmol/L (ref 3.5–5.1)
Sodium: 143 mmol/L (ref 135–145)
TCO2: 21 mmol/L — ABNORMAL LOW (ref 22–32)

## 2023-03-21 LAB — PREPARE RBC (CROSSMATCH)

## 2023-03-21 LAB — HEMOGLOBIN AND HEMATOCRIT, BLOOD
HCT: 21.1 % — ABNORMAL LOW (ref 39.0–52.0)
Hemoglobin: 7.1 g/dL — ABNORMAL LOW (ref 13.0–17.0)

## 2023-03-21 SURGERY — EGD (ESOPHAGOGASTRODUODENOSCOPY)
Anesthesia: Monitor Anesthesia Care

## 2023-03-21 MED ORDER — PROPOFOL 500 MG/50ML IV EMUL
INTRAVENOUS | Status: DC | PRN
  Administered 2023-03-21: 100 ug/kg/min via INTRAVENOUS

## 2023-03-21 MED ORDER — ALBUMIN HUMAN 5 % IV SOLN: INTRAVENOUS | Status: DC | PRN

## 2023-03-21 MED ORDER — PREDNISONE 5 MG PO TABS
5.0000 mg | ORAL_TABLET | Freq: Once | ORAL | Status: AC
  Administered 2023-03-21: 5 mg via ORAL
  Filled 2023-03-21: qty 1

## 2023-03-21 MED ORDER — SODIUM CHLORIDE 0.9% IV SOLUTION: Freq: Once | INTRAVENOUS | Status: DC

## 2023-03-21 MED ORDER — LACTATED RINGERS IV SOLN: INTRAVENOUS | Status: DC | PRN

## 2023-03-21 MED ORDER — PROPOFOL 10 MG/ML IV BOLUS
INTRAVENOUS | Status: DC | PRN
  Administered 2023-03-21: 20 mg via INTRAVENOUS
  Administered 2023-03-21: 10 mg via INTRAVENOUS

## 2023-03-21 MED ORDER — PHENYLEPHRINE 80 MCG/ML (10ML) SYRINGE FOR IV PUSH (FOR BLOOD PRESSURE SUPPORT)
PREFILLED_SYRINGE | INTRAVENOUS | Status: DC | PRN
  Administered 2023-03-21: 160 ug via INTRAVENOUS
  Administered 2023-03-21: 80 ug via INTRAVENOUS
  Administered 2023-03-21: 160 ug via INTRAVENOUS

## 2023-03-21 MED ORDER — PHENYLEPHRINE HCL-NACL 20-0.9 MG/250ML-% IV SOLN
INTRAVENOUS | Status: DC | PRN
  Administered 2023-03-21: 25 ug/min via INTRAVENOUS

## 2023-03-21 MED ORDER — PANTOPRAZOLE SODIUM 40 MG PO TBEC
40.0000 mg | DELAYED_RELEASE_TABLET | Freq: Every day | ORAL | Status: DC
  Administered 2023-03-21 – 2023-03-24 (×4): 40 mg via ORAL
  Filled 2023-03-21 (×4): qty 1

## 2023-03-21 NOTE — Anesthesia Preprocedure Evaluation (Addendum)
Anesthesia Evaluation  Patient identified by MRN, date of birth, ID band Patient awake    Reviewed: Allergy & Precautions, NPO status , Patient's Chart, lab work & pertinent test results  History of Anesthesia Complications Negative for: history of anesthetic complications  Airway Mallampati: I  TM Distance: >3 FB Neck ROM: Full    Dental  (+) Edentulous Upper, Edentulous Lower   Pulmonary former smoker   breath sounds clear to auscultation       Cardiovascular hypertension, + Valvular Problems/Murmurs (s/p TAVR)  Rhythm:Regular Rate:Normal  '22 ECHO: EF 70 to 75%. 1.  The LV has hyperdynamic function, no regional wall motion abnormalities. There is moderate concentric LVH. Grade I diastolic dysfunction (impaired relaxation).   2. RVF is normal. The right ventricular size is normal. Mildly increased right ventricular wall thickness.   3. Left atrial size was mildly dilated.   4. The mitral valve is normal in structure. No evidence of mitral valve regurgitation.   5. The aortic valve has been repaired/replaced. Aortic valve regurgitation is mild. There is a valve present in the aortic position. Echo findings are consistent with perivalvular leak of the aortic prosthesis. Aortic valve mean gradient measures 9.4 mmHg. Aortic valve Vmax measures 1.97 m/s. Aortic valve acceleration time measures 60 msec.     Neuro/Psych       Dementia Pt signs his own documentsnegative neurological ROS     GI/Hepatic Neg liver ROS,,,Presumed GI bleed   Endo/Other  diabetes, Insulin Dependent    Renal/GU Renal InsufficiencyRenal diseaseH/o stones     Musculoskeletal   Abdominal   Peds  Hematology  (+) Blood dyscrasia (Hb 7.5), anemia   Anesthesia Other Findings   Reproductive/Obstetrics                              Anesthesia Physical Anesthesia Plan  ASA: 3  Anesthesia Plan: MAC   Post-op Pain Management:  Minimal or no pain anticipated   Induction:   PONV Risk Score and Plan: 1 and Treatment may vary due to age or medical condition  Airway Management Planned: Natural Airway and Nasal Cannula  Additional Equipment: None  Intra-op Plan:   Post-operative Plan:   Informed Consent: I have reviewed the patients History and Physical, chart, labs and discussed the procedure including the risks, benefits and alternatives for the proposed anesthesia with the patient or authorized representative who has indicated his/her understanding and acceptance.       Plan Discussed with: CRNA and Surgeon  Anesthesia Plan Comments:          Anesthesia Quick Evaluation

## 2023-03-21 NOTE — Interval H&P Note (Signed)
History and Physical Interval Note:  03/21/2023 10:19 AM  Brian [name]  has presented today for surgery, with the diagnosis of melena, anemia.  The various methods of treatment have been discussed with the patient and family. After consideration of risks, benefits and other options for treatment, the patient has consented to  Procedure(s): ESOPHAGOGASTRODUODENOSCOPY (EGD) (N/A) as a surgical intervention.  The patient's history has been reviewed, patient examined, no change in status, stable for surgery.  I have reviewed the patient's chart and labs.  Questions were answered to the patient's satisfaction.     [name]

## 2023-03-21 NOTE — Transfer of Care (Signed)
Immediate Anesthesia Transfer of Care Note  Patient: Brian [name]  Procedure(s) Performed: ESOPHAGOGASTRODUODENOSCOPY (EGD) HOT HEMOSTASIS (ARGON PLASMA COAGULATION/BICAP) HEMOSTASIS CLIP PLACEMENT BIOPSY  Patient Location: PACU  Anesthesia Type:MAC  Level of Consciousness: awake and alert   Airway & Oxygen Therapy: Patient Spontanous Breathing and Patient connected to nasal cannula oxygen  Post-op Assessment: Report given to RN and Post -op Vital signs reviewed and stable  Post vital signs: Reviewed and stable  Last Vitals:  Vitals Value Taken Time  BP 115/56 03/21/23 1051  Temp 36.5 C 03/21/23 1045  Pulse 83 03/21/23 1052  Resp 20 03/21/23 1052  SpO2 100 % 03/21/23 1052  Vitals shown include unfiled device data.  Last Pain:  Vitals:   03/21/23 1019  TempSrc: Temporal  PainSc: 0-No pain         Complications: No notable events documented.

## 2023-03-21 NOTE — Anesthesia Procedure Notes (Signed)
Procedure Name: MAC Date/Time: 03/21/2023 10:24 AM  Performed by: Rachel Moulds, CRNAPre-anesthesia Checklist: Patient identified, Emergency Drugs available, Suction available, Patient being monitored and Timeout performed Patient Re-evaluated:Patient Re-evaluated prior to induction Oxygen Delivery Method: Nasal cannula Induction Type: IV induction Placement Confirmation: positive ETCO2 and breath sounds checked- equal and bilateral Dental Injury: Teeth and Oropharynx as per pre-operative assessment

## 2023-03-21 NOTE — Op Note (Signed)
[facility] Patient Name: Brian [name] Procedure Date : 03/21/2023 MRN: [id number] Attending MD: Jeani Hawking , MD, [id number] Date of Birth: [birth year] CSN: [id number] Age: 89 Admit Type: Inpatient Procedure:                Upper GI endoscopy Indications:              Iron deficiency anemia, Melena Providers:                Jeani Hawking, MD, Fransisca Connors, Rozetta Nunnery, Technician Referring MD:              Medicines:                Propofol per Anesthesia Complications:            No immediate complications. Estimated Blood Loss:     Estimated blood loss: none. Procedure:                Pre-Anesthesia Assessment:                           - Prior to the procedure, a History and Physical                            was performed, and patient medications and                            allergies were reviewed. The patient's tolerance of                            previous anesthesia was also reviewed. The risks                            and benefits of the procedure and the sedation                            options and risks were discussed with the patient.                            All questions were answered, and informed consent                            was obtained. Prior Anticoagulants: The patient has                            taken no anticoagulant or antiplatelet agents. ASA                            Grade Assessment: III - A patient with severe                            systemic disease. After reviewing the risks and  benefits, the patient was deemed in satisfactory                            condition to undergo the procedure.                           - Sedation was administered by an anesthesia                            professional. Deep sedation was attained.                           After obtaining informed consent, the endoscope was                            passed under direct  vision. Throughout the                            procedure, the patient's blood pressure, pulse, and                            oxygen saturations were monitored continuously. The                            [id number]-[id number] ([id number]) Olympus endoscope was introduced                            through the mouth, and advanced to the second part                            of duodenum. The upper GI endoscopy was                            accomplished without difficulty. The patient                            tolerated the procedure well. Scope In: Scope Out: Findings:      The esophagus was normal.      Multiple dispersed small erosions with no stigmata of recent bleeding       were found in the gastric antrum. Biopsies were taken with a cold       forceps for Helicobacter pylori testing.      One non-bleeding cratered duodenal ulcer with a nonbleeding visible       vessel (Forrest Class IIa) was found in the duodenal bulb. The lesion       was 5 mm in largest dimension. Coagulation for tissue destruction using       bipolar probe was successful. Estimated blood loss: none. For       hemostasis, one hemostatic clip was successfully placed (MR safe). Clip       manufacturer: [employer]. There was no bleeding at the end of the       procedure. Impression:               - Normal esophagus.                           -  Erosive gastropathy with no stigmata of recent                            bleeding. Biopsied.                           - Non-bleeding duodenal ulcer with a nonbleeding                            visible vessel (Forrest Class IIa). Treated with                            bipolar cautery. Clip manufacturer: [employer]. Clip (MR safe) was placed. Recommendation:           - Return patient to hospital ward for ongoing care.                           - Resume regular diet.                           - Continue present medications.                            - Await pathology results.                           - PPI QD.                           - Follow HGB and transfuse as necessary. Procedure Code(s):        --- Professional ---                           [phone], Esophagogastroduodenoscopy, flexible,                            transoral; with ablation of tumor(s), polyp(s), or                            other lesion(s) (includes pre- and post-dilation                            and guide wire passage, when performed)                           43239, 59, Esophagogastroduodenoscopy, flexible,                            transoral; with biopsy, single or multiple Diagnosis Code(s):        --- Professional ---                           K31.89, Other diseases of stomach and duodenum  K26.4, Chronic or unspecified duodenal ulcer with                            hemorrhage                           D50.9, Iron deficiency anemia, unspecified                           K92.1, Melena (includes Hematochezia) CPT copyright 2022 American Medical Association. All rights reserved. The codes documented in this report are preliminary and upon coder review may  be revised to meet current compliance requirements. Jeani Hawking, MD Jeani Hawking, MD 03/21/2023 10:46:04 AM This report has been signed electronically. Number of Addenda: 0

## 2023-03-21 NOTE — Anesthesia Postprocedure Evaluation (Signed)
Anesthesia Post Note  Patient: Brian [name]  Procedure(s) Performed: ESOPHAGOGASTRODUODENOSCOPY (EGD) HOT HEMOSTASIS (ARGON PLASMA COAGULATION/BICAP) HEMOSTASIS CLIP PLACEMENT BIOPSY     Patient location during evaluation: PACU Anesthesia Type: MAC Level of consciousness: oriented, awake and alert and patient cooperative Pain management: pain level controlled Vital Signs Assessment: post-procedure vital signs reviewed and stable Respiratory status: spontaneous breathing, nonlabored ventilation and respiratory function stable Cardiovascular status: blood pressure returned to baseline and stable Postop Assessment: no apparent nausea or vomiting Anesthetic complications: no   No notable events documented.  Last Vitals:  Vitals:   03/21/23 1100 03/21/23 1115  BP: 124/60 (!) 110/59  Pulse: 79 83  Resp: (!) 22 16  Temp:    SpO2: 100% 100%    Last Pain:  Vitals:   03/21/23 1100  TempSrc:   PainSc: 0-No pain                 Brian [name],Brian [name]

## 2023-03-21 NOTE — Progress Notes (Signed)
PROGRESS NOTE    Brian [name]  ZOX:[id number] DOB: Dec 05, 1933 DOA: 03/19/2023 PCP: Orvilla Cornwall, MD  Chief Complaint  Patient presents with   Loss of Consciousness    Brief Narrative:   Brian [name] is Brian [name] 89 y.o. male with medical history significant of hypertension, hyperlipidemia, congestive heart failure, severe aortic stenosis s/p TAVR, CKD stage III, inflammatory arthritis on chronic steroids, diverticulosis, dementia who presents after having episode of loss of consciousness.  History is obtained from his wife present at bedside as the patient has dementia with short-term memory loss and is usually alert and oriented to person and place, but usually not time.  He has been in his normal state of health, but early this morning had complained of feeling unwell.  He had gotten up to use the restroom.  His wife reported hearing Brian [name] noise and when she went to go check on him found him passed in front of the sink.  It was bright red blood in the toilet bowl.  He had not mentioned anything to her prior to this about having blood in stools.  He is on an aspirin but not on any other blood thinners.  Patient denies any complaints of chest pain, abdominal pain, nausea, vomiting, diarrhea, or dysuria symptoms.   In the emergency department patient was noted to be afebrile with mild tachycardia.  Labs significant for hemoglobin 7.6, BUN 37, creatinine 1.25, glucose 191, and INR 1.2.  Urinalysis positive for glucose.  Patient was typed and screened for possible need of blood products.  Patient was given 500 mL bolus of IV fluids.    Assessment & Plan:   Principal Problem:   Syncope and collapse Active Problems:   Acute blood loss anemia   GI bleed   S/P TAVR (transcatheter aortic valve replacement)   Essential hypertension   Type 2 diabetes, controlled, with renal manifestation (HCC)   Chronic kidney disease (CKD), stage III (moderate) (HCC)   Dementia without behavioral disturbance  (HCC)   BPH (benign prostatic hyperplasia)  Syncope and collapse  Acute blood loss anemia secondary to GI bleed Melena  - daughter showed me picture this morning concerning for melena -EGD with erosive gastropathy without stigmata of recent bleeding, non bleeding duodenal ulcer with nonbleeding visible vessel treated with bipolar cautery, clip placed - follow biopsy results -Hold aspirin, will defer question of resumption to GI  -s/p 3 units pRBC - continue to trend Hb/Hct   Severe aortic stenosis s/p TAVR Patient underwent TAVR back in 12/2019   Essential hypertension Blood pressures were noted to be soft as low as 101/80. -Hold chlorthalidone and lisinopril.  Resume when medically appropriate   Diabetes mellitus type 2, with long-term use of insulin On admission glucose elevated at 191.  Home medication regimen includes Lantus 6  units daily -Pharmacy substitution of Semglee 6 units daily -CBGs before every meal with very sensitive SSI -Adjust insulin regimen as needed   AKI on Chronic kidney disease stage IIIa Acute.  Creatinine 1.25 with BUN 37 which appears around patient's baseline when looking on care everywhere records. Worsening today, in setting of acute blood loss anemia and contrast exposure Thiazide and ace remain on hold Continue supportive care Strict I/O, daily weights   Inflammatory arthritis on chronic steroids Initially diagnosed by rheumatology at [facility] in 2021. -Continue prednisone and Plaquenil   Dementia, without behavioral disturbance -Delirium precautions   BPH -Continue Proscar and Flomax   DVT prophylaxis: SCD Code Status: full Family Communication:  daughter at bedsdie Disposition:   Status is: Observation The patient will require care spanning > 2 midnights and should be moved to inpatient because: need for inpatient care   Consultants:  GI  Procedures:  none  Antimicrobials:  Anti-infectives (From admission, onward)    None        Subjective: Son, wife, daughter at bedside He's got no complaints, more alert than yesterday  Objective: Vitals:   03/21/23 1130 03/21/23 1145 03/21/23 1202 03/21/23 1521  BP: (!) 116/57 116/61 (!) 115/55 (!) 106/51  Pulse: 90 78 83 87  Resp: (!) 21 20  17   Temp:  97.7 F (36.5 C) (!) 97.4 F (36.3 C) 99.2 F (37.3 C)  TempSrc:      SpO2: 100% 100% 100% 100%  Weight:      Height:        Intake/Output Summary (Last 24 hours) at 03/21/2023 1758 Last data filed at 03/21/2023 1036 Gross per 24 hour  Intake 200 ml  Output --  Net 200 ml   Filed Weights   03/19/23 1422 03/20/23 0414  Weight: 58.8 kg 60.2 kg    Examination:  General: No acute distress. Cardiovascular: RRR Lungs: unlabored Abdomen: Soft, nontender, nondistended Neurological: Alert, more alert today, pleasantly confused. Moves all extremities 4 with equal strength. Cranial nerves II through XII grossly intact. Extremities: No clubbing or cyanosis. No edema.   Data Reviewed: I have personally reviewed following labs and imaging studies  CBC: Recent Labs  Lab 03/19/23 0739 03/19/23 2306 03/20/23 0314 03/20/23 1734 03/20/23 2205 03/21/23 0448 03/21/23 1051  WBC 9.2  --  10.4  --   --  8.2  --   NEUTROABS 6.7  --   --   --   --  5.2  --   HGB 7.6*   < > 7.3* 8.1* 8.1* 7.5* 7.5*  HCT 24.8*   < > 22.0* 23.4* 24.0* 22.0* 22.0*  MCV 90.5  --  86.3  --   --  88.4  --   PLT 199  --  157  --   --  150  --    < > = values in this interval not displayed.    Basic Metabolic Panel: Recent Labs  Lab 03/19/23 0739 03/20/23 0314 03/21/23 0448 03/21/23 1051  NA 138 138 139 143  K 4.3 4.0 3.6 4.1  CL 109 109 110 111  CO2 21* 20* 21*  --   GLUCOSE 191* 247* 173* 177*  BUN 37* 53* 56* 53*  CREATININE 1.25* 1.37* 1.87* 2.10*  CALCIUM 8.5* 8.3* 8.0*  --   MG  --   --  1.9  --   PHOS  --   --  4.0  --     GFR: Estimated Creatinine Clearance: 21.1 mL/min (Brian [name]) (by C-G formula based on SCr of 2.1  mg/dL (H)).  Liver Function Tests: Recent Labs  Lab 03/19/23 0739 03/21/23 0448  AST 17 17  ALT 11 10  ALKPHOS 30* 19*  BILITOT 0.5 0.6  PROT 5.2* 4.7*  ALBUMIN 2.8* 2.6*    CBG: Recent Labs  Lab 03/20/23 1605 03/20/23 2015 03/21/23 0844 03/21/23 1209 03/21/23 1544  GLUCAP 171* 161* 195* 172* 200*     No results found for this or any previous visit (from the past 240 hour(s)).       Radiology Studies: No results found.      Scheduled Meds:  finasteride  5 mg Oral Daily   gabapentin  100  mg Oral QHS   insulin aspart  0-6 Units Subcutaneous TID WC   insulin glargine-yfgn  6 Units Subcutaneous Daily   [START ON 03/23/2023] pantoprazole  40 mg Intravenous Q12H   predniSONE  5 mg Oral Daily   tamsulosin  0.4 mg Oral Daily   Continuous Infusions:  pantoprazole 8 mg/hr (03/21/23 0040)   sodium chloride       LOS: 1 day    Time spent: over 30 min     Lacretia Nicks, MD [facility]   To contact the attending provider between 7A-7P or the covering provider during after hours 7P-7A, please log into the web site [email or web address] and access using universal [place] password for that web site. If you do not have the password, please call the hospital operator.  03/21/2023, 5:58 PM

## 2023-03-22 DIAGNOSIS — R55 Syncope and collapse: Secondary | ICD-10-CM | POA: Diagnosis not present

## 2023-03-22 LAB — CBC WITH DIFFERENTIAL/PLATELET
Abs Immature Granulocytes: 0.04 10*3/uL (ref 0.00–0.07)
Basophils Absolute: 0 10*3/uL (ref 0.0–0.1)
Basophils Relative: 0 %
Eosinophils Absolute: 0.1 10*3/uL (ref 0.0–0.5)
Eosinophils Relative: 1 %
HCT: 21.3 % — ABNORMAL LOW (ref 39.0–52.0)
Hemoglobin: 7.2 g/dL — ABNORMAL LOW (ref 13.0–17.0)
Immature Granulocytes: 1 %
Lymphocytes Relative: 25 %
Lymphs Abs: 1.9 10*3/uL (ref 0.7–4.0)
MCH: 29.6 pg (ref 26.0–34.0)
MCHC: 33.8 g/dL (ref 30.0–36.0)
MCV: 87.7 fL (ref 80.0–100.0)
Monocytes Absolute: 0.5 10*3/uL (ref 0.1–1.0)
Monocytes Relative: 7 %
Neutro Abs: 5.1 10*3/uL (ref 1.7–7.7)
Neutrophils Relative %: 66 %
Platelets: 128 10*3/uL — ABNORMAL LOW (ref 150–400)
RBC: 2.43 MIL/uL — ABNORMAL LOW (ref 4.22–5.81)
RDW: 15.3 % (ref 11.5–15.5)
WBC: 7.7 10*3/uL (ref 4.0–10.5)
nRBC: 0 % (ref 0.0–0.2)

## 2023-03-22 LAB — GLUCOSE, CAPILLARY
Glucose-Capillary: 186 mg/dL — ABNORMAL HIGH (ref 70–99)
Glucose-Capillary: 223 mg/dL — ABNORMAL HIGH (ref 70–99)
Glucose-Capillary: 209 mg/dL — ABNORMAL HIGH (ref 70–99)

## 2023-03-22 LAB — COMPREHENSIVE METABOLIC PANEL WITH GFR
ALT: 9 U/L (ref 0–44)
AST: 13 U/L — ABNORMAL LOW (ref 15–41)
Albumin: 2.7 g/dL — ABNORMAL LOW (ref 3.5–5.0)
Alkaline Phosphatase: 20 U/L — ABNORMAL LOW (ref 38–126)
Anion gap: 7 (ref 5–15)
BUN: 55 mg/dL — ABNORMAL HIGH (ref 8–23)
CO2: 21 mmol/L — ABNORMAL LOW (ref 22–32)
Calcium: 7.8 mg/dL — ABNORMAL LOW (ref 8.9–10.3)
Chloride: 110 mmol/L (ref 98–111)
Creatinine, Ser: 1.83 mg/dL — ABNORMAL HIGH (ref 0.61–1.24)
GFR, Estimated: 35 mL/min — ABNORMAL LOW (ref >60)
Glucose, Bld: 145 mg/dL — ABNORMAL HIGH (ref 70–99)
Potassium: 3.6 mmol/L (ref 3.5–5.1)
Sodium: 138 mmol/L (ref 135–145)
Total Bilirubin: 0.7 mg/dL (ref 0.3–1.2)
Total Protein: 4.6 g/dL — ABNORMAL LOW (ref 6.5–8.1)

## 2023-03-22 LAB — PREPARE RBC (CROSSMATCH)

## 2023-03-22 LAB — TYPE AND SCREEN: Unit division: 0

## 2023-03-22 LAB — MAGNESIUM: Magnesium: 1.9 mg/dL (ref 1.7–2.4)

## 2023-03-22 LAB — HEMOGLOBIN AND HEMATOCRIT, BLOOD
HCT: 25.2 % — ABNORMAL LOW (ref 39.0–52.0)
Hemoglobin: 8.5 g/dL — ABNORMAL LOW (ref 13.0–17.0)

## 2023-03-22 LAB — PHOSPHORUS: Phosphorus: 4 mg/dL (ref 2.5–4.6)

## 2023-03-22 MED ORDER — SODIUM CHLORIDE 0.9% IV SOLUTION: Freq: Once | INTRAVENOUS | Status: AC

## 2023-03-22 MED ORDER — POLYVINYL ALCOHOL 1.4 % OP SOLN
1.0000 [drp] | OPHTHALMIC | Status: DC | PRN
  Filled 2023-03-22: qty 15

## 2023-03-22 MED ORDER — INSULIN ASPART 100 UNIT/ML IJ SOLN
0.0000 [IU] | Freq: Every day | INTRAMUSCULAR | Status: DC
  Administered 2023-03-22 – 2023-03-23 (×2): 2 [IU] via SUBCUTANEOUS

## 2023-03-22 MED ORDER — INSULIN ASPART 100 UNIT/ML IJ SOLN
0.0000 [IU] | Freq: Three times a day (TID) | INTRAMUSCULAR | Status: DC
  Administered 2023-03-23: 3 [IU] via SUBCUTANEOUS
  Administered 2023-03-23: 2 [IU] via SUBCUTANEOUS

## 2023-03-22 NOTE — Progress Notes (Signed)
Pts BS its 209 no coverage ordered at night. Hospitalist made aware.

## 2023-03-22 NOTE — Progress Notes (Signed)
PROGRESS NOTE    Brian [name]  RUE:[id number] DOB: [birth year] DOA: 03/19/2023 PCP: Orvilla Cornwall, MD  Chief Complaint  Patient presents with   Loss of Consciousness    Brief Narrative:   LOTUS MCCLENAGHAN is Brian [name] 89 y.o. male with medical history significant of hypertension, hyperlipidemia, congestive heart failure, severe aortic stenosis s/p TAVR, CKD stage III, inflammatory arthritis on chronic steroids, diverticulosis, dementia who presents after having episode of loss of consciousness.  History is obtained from his wife present at bedside as the patient has dementia with short-term memory loss and is usually alert and oriented to person and place, but usually not time.  He has been in his normal state of health, but early this morning had complained of feeling unwell.  He had gotten up to use the restroom.  His wife reported hearing Brandom Kerwin noise and when she went to go check on him found him passed in front of the sink.  It was bright red blood in the toilet bowl.  He had not mentioned anything to her prior to this about having blood in stools.  He is on an aspirin but not on any other blood thinners.  Patient denies any complaints of chest pain, abdominal pain, nausea, vomiting, diarrhea, or dysuria symptoms.   In the emergency department patient was noted to be afebrile with mild tachycardia.  Labs significant for hemoglobin 7.6, BUN 37, creatinine 1.25, glucose 191, and INR 1.2.  Urinalysis positive for glucose.  Patient was typed and screened for possible need of blood products.  Patient was given 500 mL bolus of IV fluids.    Assessment & Plan:   Principal Problem:   Syncope and collapse Active Problems:   Acute blood loss anemia   GI bleed   S/P TAVR (transcatheter aortic valve replacement)   Essential hypertension   Type 2 diabetes, controlled, with renal manifestation (HCC)   Chronic kidney disease (CKD), stage III (moderate) (HCC)   Dementia without behavioral disturbance  (HCC)   BPH (benign prostatic hyperplasia)  Syncope and collapse  Acute blood loss anemia secondary to GI bleed Melena  -EGD with erosive gastropathy without stigmata of recent bleeding, non bleeding duodenal ulcer with nonbleeding visible vessel treated with bipolar cautery, clip placed - daily PPI  - follow biopsy results -Hold aspirin, will defer question of resumption to GI  -s/p 5 units pRBC -continue to trend Hb/Hct   Severe aortic stenosis s/p TAVR Patient underwent TAVR back in 12/2019   Essential hypertension Blood pressures were noted to be soft as low as 101/80. -Hold chlorthalidone and lisinopril.  Resume when medically appropriate   Diabetes mellitus type 2, with long-term use of insulin On admission glucose elevated at 191.  Home medication regimen includes Lantus 6  units daily -Pharmacy substitution of Semglee 6 units daily -CBGs before every meal with very sensitive SSI -Adjust insulin regimen as needed   AKI on Chronic kidney disease stage IIIa Acute.  Creatinine 1.25 with BUN 37 which appears around patient's baseline when looking on care everywhere records. Peaked at 2.1, in setting of acute blood loss anemia and contrast exposure Gradually improving  Thiazide and ace remain on hold Continue supportive care Strict I/O, daily weights   Inflammatory arthritis on chronic steroids Initially diagnosed by [facility]ology at [facility] in 2021. -Continue prednisone and Plaquenil   Dementia, without behavioral disturbance -Delirium precautions   BPH -Continue Proscar and Flomax    DVT prophylaxis: SCD Code Status: full Family Communication: daughter at  bedsdie Disposition:   Status is: Observation The patient will require care spanning > 2 midnights and should be moved to inpatient because: need for inpatient care   Consultants:  GI  Procedures:  none  Antimicrobials:  Anti-infectives (From admission, onward)    None       Subjective: Son, wife  at bedside No complaints  Objective: Vitals:   03/22/23 0746 03/22/23 1040 03/22/23 1159 03/22/23 1201  BP: (!) 113/49 (!) 114/57 (!) 132/58 (!) 132/58  Pulse: 84 72 (!) 59 (!) 59  Resp: 18 18 16 16   Temp: 98 F (36.7 C) 97.9 F (36.6 C) 98.6 F (37 C) 98.6 F (37 C)  TempSrc: Oral Oral Oral Oral  SpO2: 99% 100% 100% 100%  Weight:      Height:        Intake/Output Summary (Last 24 hours) at 03/22/2023 1426 Last data filed at 03/22/2023 1416 Gross per 24 hour  Intake 765 ml  Output --  Net 765 ml   Filed Weights   03/19/23 1422 03/20/23 0414 03/22/23 0613  Weight: 58.8 kg 60.2 kg 59.8 kg    Examination:  General: No acute distress. Cardiovascular: RRR Lungs: unlabored Neurological: Alert and pleasantly confused. Moves all extremities 4 with equal strength. Cranial nerves II through XII grossly intact. Extremities: No clubbing or cyanosis. No edema.  Data Reviewed: I have personally reviewed following labs and imaging studies  CBC: Recent Labs  Lab 03/19/23 0739 03/19/23 2306 03/20/23 0314 03/20/23 1734 03/20/23 2205 03/21/23 0448 03/21/23 1051 03/21/23 1750 03/22/23 0406  WBC 9.2  --  10.4  --   --  8.2  --   --  7.7  NEUTROABS 6.7  --   --   --   --  5.2  --   --  5.1  HGB 7.6*   < > 7.3*   < > 8.1* 7.5* 7.5* 7.1* 7.2*  HCT 24.8*   < > 22.0*   < > 24.0* 22.0* 22.0* 21.1* 21.3*  MCV 90.5  --  86.3  --   --  88.4  --   --  87.7  PLT 199  --  157  --   --  150  --   --  128*   < > = values in this interval not displayed.    Basic Metabolic Panel: Recent Labs  Lab 03/19/23 0739 03/20/23 0314 03/21/23 0448 03/21/23 1051 03/22/23 0406  NA 138 138 139 143 138  K 4.3 4.0 3.6 4.1 3.6  CL 109 109 110 111 110  CO2 21* 20* 21*  --  21*  GLUCOSE 191* 247* 173* 177* 145*  BUN 37* 53* 56* 53* 55*  CREATININE 1.25* 1.37* 1.87* 2.10* 1.83*  CALCIUM 8.5* 8.3* 8.0*  --  7.8*  MG  --   --  1.9  --  1.9  PHOS  --   --  4.0  --  4.0    GFR: Estimated  Creatinine Clearance: 24.1 mL/min (Matheo Rathbone) (by C-G formula based on SCr of 1.83 mg/dL (H)).  Liver Function Tests: Recent Labs  Lab 03/19/23 0739 03/21/23 0448 03/22/23 0406  AST 17 17 13*  ALT 11 10 9   ALKPHOS 30* 19* 20*  BILITOT 0.5 0.6 0.7  PROT 5.2* 4.7* 4.6*  ALBUMIN 2.8* 2.6* 2.7*    CBG: Recent Labs  Lab 03/21/23 0844 03/21/23 1209 03/21/23 1544 03/21/23 1937 03/22/23 0825  GLUCAP 195* 172* 200* 270* 186*     No results  found for this or any previous visit (from the past 240 hour(s)).       Radiology Studies: No results found.      Scheduled Meds:  sodium chloride   Intravenous Once   finasteride  5 mg Oral Daily   gabapentin  100 mg Oral QHS   insulin aspart  0-6 Units Subcutaneous TID WC   insulin glargine-yfgn  6 Units Subcutaneous Daily   pantoprazole  40 mg Oral Daily   predniSONE  5 mg Oral Daily   tamsulosin  0.4 mg Oral Daily   Continuous Infusions:  sodium chloride       LOS: 2 days    Time spent: over 30 min     Lacretia Nicks, MD [facility]   To contact the attending provider between 7A-7P or the covering provider during after hours 7P-7A, please log into the web site [email or web address] and access using universal [place] password for that web site. If you do not have the password, please call the hospital operator.  03/22/2023, 2:26 PM

## 2023-03-22 NOTE — Progress Notes (Signed)
Subjective: No acute events.  Family does not report any bleeding.  Objective: Vital signs in last 24 hours: Temp:  [97.4 F (36.3 C)-99.4 F (37.4 C)] 98 F (36.7 C) (07/28 0746) Pulse Rate:  [78-98] 84 (07/28 0746) Resp:  [16-32] 18 (07/28 0746) BP: (93-124)/(48-69) 113/49 (07/28 0746) SpO2:  [98 %-100 %] 99 % (07/28 0746) Weight:  [59.8 kg] 59.8 kg (07/28 0613) Last BM Date : 03/20/23  Intake/Output from previous day: 07/27 0701 - 07/28 0700 In: 515 [I.V.:200; Blood:315] Out: -  Intake/Output this shift: No intake/output data recorded.  General appearance: alert and no distress GI: soft, non-tender; bowel sounds normal; no masses,  no organomegaly  Lab Results: Recent Labs    03/20/23 0314 03/20/23 1734 03/21/23 0448 03/21/23 1051 03/21/23 1750 03/22/23 0406  WBC 10.4  --  8.2  --   --  7.7  HGB 7.3*   < > 7.5* 7.5* 7.1* 7.2*  HCT 22.0*   < > 22.0* 22.0* 21.1* 21.3*  PLT 157  --  150  --   --  128*   < > = values in this interval not displayed.   BMET Recent Labs    03/20/23 0314 03/21/23 0448 03/21/23 1051 03/22/23 0406  NA 138 139 143 138  K 4.0 3.6 4.1 3.6  CL 109 110 111 110  CO2 20* 21*  --  21*  GLUCOSE 247* 173* 177* 145*  BUN 53* 56* 53* 55*  CREATININE 1.37* 1.87* 2.10* 1.83*  CALCIUM 8.3* 8.0*  --  7.8*   LFT Recent Labs    03/22/23 0406  PROT 4.6*  ALBUMIN 2.7*  AST 13*  ALT 9  ALKPHOS 20*  BILITOT 0.7   PT/INR No results for input(s): "LABPROT", "INR" in the last 72 hours. Hepatitis Panel No results for input(s): "HEPBSAG", "HCVAB", "HEPAIGM", "HEPBIGM" in the last 72 hours. C-Diff No results for input(s): "CDIFFTOX" in the last 72 hours. Fecal Lactopherrin No results for input(s): "FECLLACTOFRN" in the last 72 hours.  Studies/Results: No results found.  Medications: Scheduled:  sodium chloride   Intravenous Once   sodium chloride   Intravenous Once   finasteride  5 mg Oral Daily   gabapentin  100 mg Oral QHS    insulin aspart  0-6 Units Subcutaneous TID WC   insulin glargine-yfgn  6 Units Subcutaneous Daily   pantoprazole  40 mg Oral Daily   predniSONE  5 mg Oral Daily   tamsulosin  0.4 mg Oral Daily   Continuous:  sodium chloride      Assessment/Plan: 1) Duodenal ulcer with visible vessel s/p BICAP and hemoclipping. 2) Anemia.   The patient continues to have an anemia after one unit of PRBC.  He will benefit with another unit.  Plan: 1) Recommend to transfuse another unit of PRBC. 2) Maintain PPI. 3) Follow up with [facility] upon discharge. 4) Signing off.  LOS: 2 days   Jasia Hiltunen D 03/22/2023, 9:05 AM

## 2023-03-22 NOTE — Plan of Care (Signed)

## 2023-03-22 NOTE — Progress Notes (Signed)
This is to make you aware of pts H+H 7.5/22, 2hrs after 1 unit of RBC   MD aware.

## 2023-03-23 DIAGNOSIS — R55 Syncope and collapse: Secondary | ICD-10-CM | POA: Diagnosis not present

## 2023-03-23 DIAGNOSIS — K922 Gastrointestinal hemorrhage, unspecified: Secondary | ICD-10-CM | POA: Diagnosis not present

## 2023-03-23 LAB — GLUCOSE, CAPILLARY
Glucose-Capillary: 107 mg/dL — ABNORMAL HIGH (ref 70–99)
Glucose-Capillary: 151 mg/dL — ABNORMAL HIGH (ref 70–99)
Glucose-Capillary: 230 mg/dL — ABNORMAL HIGH (ref 70–99)
Glucose-Capillary: 208 mg/dL — ABNORMAL HIGH (ref 70–99)

## 2023-03-23 MED ORDER — GABAPENTIN 100 MG PO CAPS
100.0000 mg | ORAL_CAPSULE | Freq: Every day | ORAL | Status: DC
  Administered 2023-03-23: 100 mg via ORAL
  Filled 2023-03-23: qty 1

## 2023-03-23 MED ORDER — GLUCERNA SHAKE PO LIQD: 237.0000 mL | Freq: Two times a day (BID) | ORAL | Status: DC

## 2023-03-23 MED ORDER — ASPIRIN 81 MG PO CHEW
81.0000 mg | CHEWABLE_TABLET | Freq: Every day | ORAL | Status: DC
  Administered 2023-03-24: 81 mg via ORAL
  Filled 2023-03-23: qty 1

## 2023-03-23 NOTE — Evaluation (Signed)
Occupational Therapy Evaluation Patient Details Name: Brian [name] MRN: [id number] DOB: 26-Sep-1933 Today's Date: 03/23/2023   History of Present Illness 89 y.o. male presents to [facility] hospital on 03/19/2023 after syncopal episode and hematochezia. Upper GI endoscopy on 7/27. PMH includes HTN, HLD, dementia, CHF, TAVR, CKDIII, diverticulosis.   Clinical Impression   Naren was evaluated s/p the above admission list. Per his wife he is indep for mobility and ADLs at baseline, he also mows the yard and drives short distances. Upon evaluation the pt was limited by baseline impaired cognition, generalized weakness and mildly unsteady gait. Overall he needed generalized supervision A for all mobility and ADLs with cues for attention and sequencing. Per his wife, his cognition and functional level are at baseline. Pt does not require further acute OT, recommend d/c to home with support of his wife.        Recommendations for follow up therapy are one component of a multi-disciplinary discharge planning process, led by the attending physician.  Recommendations may be updated based on patient status, additional functional criteria and insurance authorization.   Assistance Recommended at Discharge Set up Supervision/Assistance  Patient can return home with the following Assist for transportation;Direct supervision/assist for medications management;Direct supervision/assist for financial management    Functional Status Assessment  Patient has had a recent decline in their functional status and demonstrates the ability to make significant improvements in function in a reasonable and predictable amount of time.  Equipment Recommendations  None recommended by OT       Precautions / Restrictions Precautions Precautions: Fall Restrictions Weight Bearing Restrictions: No      Mobility Bed Mobility                    Transfers Overall transfer level: Needs assistance Equipment used:  None Transfers: Sit to/from Stand Sit to Stand: Supervision                  Balance Overall balance assessment: Needs assistance Sitting-balance support: No upper extremity supported, Feet supported Sitting balance-Leahy Scale: Good     Standing balance support: No upper extremity supported, During functional activity Standing balance-Leahy Scale: Good                             ADL either performed or assessed with clinical judgement   ADL Overall ADL's : Needs assistance/impaired Eating/Feeding: Set up;Sitting   Grooming: Set up;Sitting   Upper Body Bathing: Set up;Sitting   Lower Body Bathing: Supervison/ safety;Sit to/from stand   Upper Body Dressing : Set up;Sitting   Lower Body Dressing: Supervision/safety;Sit to/from stand   Toilet Transfer: Supervision/safety;Ambulation   Toileting- Clothing Manipulation and Hygiene: Supervision/safety       Functional mobility during ADLs: Supervision/safety General ADL Comments: needs cues for attention and sequencing, easily distracted     Vision Baseline Vision/History: 0 No visual deficits Vision Assessment?: No apparent visual deficits     Perception Perception Perception Tested?: No   Praxis Praxis Praxis tested?: Not tested    Pertinent Vitals/Pain Pain Assessment Pain Assessment: No/denies pain        Extremity/Trunk Assessment Upper Extremity Assessment Upper Extremity Assessment: Overall WFL for tasks assessed   Lower Extremity Assessment Lower Extremity Assessment: Generalized weakness   Cervical / Trunk Assessment Cervical / Trunk Assessment: Normal   Communication Communication Communication: No difficulties   Cognition Arousal/Alertness: Lethargic Behavior During Therapy: Impulsive Overall Cognitive Status: History of cognitive impairments -  at baseline           General Comments: pt with dementia, oriented to person, very poor short term memory, perseverates on  same phrases throughout session - per wife, his presentation is baseline     General Comments  VSS on RA, wife present     Home Living Family/patient expects to be discharged to:: Private residence Living Arrangements: Spouse/significant other Available Help at Discharge: Family;Available 24 hours/day Type of Home: House Home Access: Stairs to enter [employer] of Steps: 6 Entrance Stairs-Rails: Left Home Layout: Two level               Home Equipment: None          Prior Functioning/Environment Prior Level of Function : Needs assist             Mobility Comments: pt mobilizes independently, mows the lawn ADLs Comments: indep, some assist for IADLs however pt's wife reports he still drives short distances        OT Problem List: Decreased activity tolerance;Decreased cognition         OT Goals(Current goals can be found in the care plan section) Acute Rehab OT Goals Patient Stated Goal: per wife, to go home OT Goal Formulation: With patient Time For Goal Achievement: 03/23/23 Potential to Achieve Goals: Good   AM-PAC OT "6 Clicks" Daily Activity     Outcome Measure Help from another person eating meals?: A Little Help from another person taking care of personal grooming?: A Little Help from another person toileting, which includes using toliet, bedpan, or urinal?: A Little Help from another person bathing (including washing, rinsing, drying)?: A Little Help from another person to put on and taking off regular upper body clothing?: A Little Help from another person to put on and taking off regular lower body clothing?: A Little 6 Click Score: 18   End of Session Nurse Communication: Mobility status  Activity Tolerance: Patient tolerated treatment well Patient left: in bed;with call bell/phone within reach;with bed alarm set  OT Visit Diagnosis: Muscle weakness (generalized) (M62.81)                Time: 1308-6578 OT Time Calculation (min):  12 min Charges:  OT General Charges $OT Visit: 1 Visit OT Evaluation $OT Eval Low Complexity: 1 Low  Derenda Mis, OTR/L Acute Rehabilitation Services Office [phone] Secure Chat Communication Preferred   Donia Pounds 03/23/2023, 1:11 PM

## 2023-03-23 NOTE — Plan of Care (Signed)

## 2023-03-23 NOTE — Progress Notes (Addendum)
PROGRESS NOTE    Brian [name]  KZS:[id number] DOB: 02/27/1934 DOA: 03/19/2023 PCP: Orvilla Cornwall, MD  Chief Complaint  Patient presents with   Loss of Consciousness    Brief Narrative:   Brian [name] is Brian [name] 89 y.o. male with medical history significant of hypertension, hyperlipidemia, congestive heart failure, severe aortic stenosis s/p TAVR, CKD stage III, inflammatory arthritis on chronic steroids, diverticulosis, dementia who presents after having episode of loss of consciousness.  History is obtained from his wife present at bedside as the patient has dementia with short-term memory loss and is usually alert and oriented to person and place, but usually not time.  He has been in his normal state of health, but early this morning had complained of feeling unwell.  He had gotten up to use the restroom.  His wife reported hearing Marliyah Reid noise and when she went to go check on him found him passed in front of the sink.  It was bright red blood in the toilet bowl.  He had not mentioned anything to her prior to this about having blood in stools.  He is on an aspirin but not on any other blood thinners.  Patient denies any complaints of chest pain, abdominal pain, nausea, vomiting, diarrhea, or dysuria symptoms.   In the emergency department patient was noted to be afebrile with mild tachycardia.  Labs significant for hemoglobin 7.6, BUN 37, creatinine 1.25, glucose 191, and INR 1.2.  Urinalysis positive for glucose.  Patient was typed and screened for possible need of blood products.  Patient was given 500 mL bolus of IV fluids.    Assessment & Plan:   Principal Problem:   Syncope and collapse Active Problems:   Acute blood loss anemia   GI bleed   S/P TAVR (transcatheter aortic valve replacement)   Essential hypertension   Type 2 diabetes, controlled, with renal manifestation (HCC)   Chronic kidney disease (CKD), stage III (moderate) (HCC)   Dementia without behavioral disturbance  (HCC)   BPH (benign prostatic hyperplasia)  Syncope and collapse  Acute blood loss anemia secondary to GI bleed Melena  -EGD with erosive gastropathy without stigmata of recent bleeding, non bleeding duodenal ulcer with nonbleeding visible vessel treated with bipolar cautery, clip placed - daily PPI  - follow biopsy results - GI ok with resumption of aspirin -s/p 5 units pRBC -continue to trend Hb/Hct   Severe aortic stenosis s/p TAVR Patient underwent TAVR back in 12/2019 Will resume aspirin tomorrow   Essential hypertension Blood pressures were noted to be soft as low as 101/80. -Hold chlorthalidone and lisinopril.  Resume when medically appropriate   Diabetes mellitus type 2, with long-term use of insulin On admission glucose elevated at 191.  Home medication regimen includes Lantus 6  units daily -Pharmacy substitution of Semglee 6 units daily -CBGs before every meal with very sensitive SSI -Adjust insulin regimen as needed   AKI on Chronic kidney disease stage IIIa Acute.  Creatinine 1.25 with BUN 37 which appears around patient's baseline when looking on care everywhere records. Peaked at 2.1, in setting of acute blood loss anemia and contrast exposure Gradually improving  Thiazide and ace remain on hold Continue supportive care Strict I/O, daily weights   Inflammatory arthritis on chronic steroids Initially diagnosed by rheumatology at [facility] in 2021. -Continue prednisone and Plaquenil   Dementia, without behavioral disturbance -Delirium precautions   BPH -Continue Proscar and Flomax  Awaiting PT/OT evals     DVT prophylaxis: SCD Code  Status: full Family Communication: daughter at bedsdie Disposition:   Status is: Observation The patient will require care spanning > 2 midnights and should be moved to inpatient because: need for inpatient care   Consultants:  GI  Procedures:  none  Antimicrobials:  Anti-infectives (From admission, onward)    None        Subjective: Daughter at bedside No complaints  Objective: Vitals:   03/22/23 1631 03/22/23 1954 03/23/23 0449 03/23/23 0734  BP: 119/63 (!) 120/49 (!) 98/51 (!) 121/49  Pulse: 60 (!) 54 60 65  Resp: 18 18 18 17   Temp: 97.6 F (36.4 C) 98.8 F (37.1 C) 97.9 F (36.6 C) 97.6 F (36.4 C)  TempSrc:  Oral    SpO2: 100% 100% 98% 100%  Weight:      Height:        Intake/Output Summary (Last 24 hours) at 03/23/2023 0945 Last data filed at 03/23/2023 0809 Gross per 24 hour  Intake 690 ml  Output 115 ml  Net 575 ml   Filed Weights   03/19/23 1422 03/20/23 0414 03/22/23 0613  Weight: 58.8 kg 60.2 kg 59.8 kg    Examination:  General: No acute distress. Cardiovascular: RRR Lungs: unlabored Neurological: Alert, pleasantly confused. Moves all extremities 4. Cranial nerves II through XII grossly intact. Extremities: No clubbing or cyanosis. No edema.   Data Reviewed: I have personally reviewed following labs and imaging studies  CBC: Recent Labs  Lab 03/19/23 0739 03/19/23 2306 03/20/23 0314 03/20/23 1734 03/21/23 0448 03/21/23 1051 03/21/23 1750 03/22/23 0406 03/22/23 1922 03/23/23 0406  WBC 9.2  --  10.4  --  8.2  --   --  7.7  --  6.8  NEUTROABS 6.7  --   --   --  5.2  --   --  5.1  --  4.5  HGB 7.6*   < > 7.3*   < > 7.5* 7.5* 7.1* 7.2* 8.5* 8.4*  HCT 24.8*   < > 22.0*   < > 22.0* 22.0* 21.1* 21.3* 25.2* 24.5*  MCV 90.5  --  86.3  --  88.4  --   --  87.7  --  86.3  PLT 199  --  157  --  150  --   --  128*  --  121*   < > = values in this interval not displayed.    Basic Metabolic Panel: Recent Labs  Lab 03/19/23 0739 03/20/23 0314 03/21/23 0448 03/21/23 1051 03/22/23 0406 03/23/23 0406  NA 138 138 139 143 138 139  K 4.3 4.0 3.6 4.1 3.6 3.6  CL 109 109 110 111 110 111  CO2 21* 20* 21*  --  21* 22  GLUCOSE 191* 247* 173* 177* 145* 69*  BUN 37* 53* 56* 53* 55* 42*  CREATININE 1.25* 1.37* 1.87* 2.10* 1.83* 1.56*  CALCIUM 8.5* 8.3* 8.0*  --   7.8* 8.2*  MG  --   --  1.9  --  1.9 2.0  PHOS  --   --  4.0  --  4.0 2.9    GFR: Estimated Creatinine Clearance: 28.2 mL/min (Deandrea Vanpelt) (by C-G formula based on SCr of 1.56 mg/dL (H)).  Liver Function Tests: Recent Labs  Lab 03/19/23 0739 03/21/23 0448 03/22/23 0406 03/23/23 0406  AST 17 17 13* 17  ALT 11 10 9 12   ALKPHOS 30* 19* 20* 26*  BILITOT 0.5 0.6 0.7 0.5  PROT 5.2* 4.7* 4.6* 4.8*  ALBUMIN 2.8* 2.6* 2.7* 2.7*  CBG: Recent Labs  Lab 03/21/23 1937 03/22/23 0825 03/22/23 1634 03/22/23 1957 03/23/23 0731  GLUCAP 270* 186* 223* 209* 107*     No results found for this or any previous visit (from the past 240 hour(s)).       Radiology Studies: No results found.      Scheduled Meds:  sodium chloride   Intravenous Once   feeding supplement (GLUCERNA SHAKE)  237 mL Oral BID BM   finasteride  5 mg Oral Daily   gabapentin  100 mg Oral QHS   insulin aspart  0-5 Units Subcutaneous QHS   insulin aspart  0-9 Units Subcutaneous TID WC   insulin glargine-yfgn  6 Units Subcutaneous Daily   pantoprazole  40 mg Oral Daily   predniSONE  5 mg Oral Daily   tamsulosin  0.4 mg Oral Daily   Continuous Infusions:  sodium chloride       LOS: 3 days    Time spent: over 30 min     Lacretia Nicks, MD [facility]   To contact the attending provider between 7A-7P or the covering provider during after hours 7P-7A, please log into the web site [email or web address] and access using universal [place] password for that web site. If you do not have the password, please call the hospital operator.  03/23/2023, 9:45 AM

## 2023-03-23 NOTE — Evaluation (Signed)
Physical Therapy Evaluation Patient Details Name: Brian [name] MRN: [id number] DOB: March 23, 1934 Today's Date: 03/23/2023  History of Present Illness  89 y.o. male presents to [facility] hospital on 03/19/2023 after syncopal episode and hematochezia. Upper GI endoscopy on 7/27. PMH includes HTN, HLD, dementia, CHF, TAVR, CKDIII, diverticulosis.  Clinical Impression  Pt presents to PT mobilizing well. Pt is able to ambulate without physical assistance, although his tolerance for activity appears limited compared to baseline. Pt reports intermittent lightheadedness when ambulating, as well as LE fatigue. Vitals stable. Pt will benefit from frequent mobilization as his spouse reports this was his first time ambulating since admission. PT anticipates the pt will progress well with further opportunities to ambulate. PT recommends discharge home when medically appropriate.        If plan is discharge home, recommend the following: Direct supervision/assist for medications management;Direct supervision/assist for financial management;Assistance with cooking/housework;Assist for transportation;Help with stairs or ramp for entrance   Can travel by private vehicle        Equipment Recommendations None recommended by PT  Recommendations for Other Services       Functional Status Assessment Patient has had a recent decline in their functional status and demonstrates the ability to make significant improvements in function in a reasonable and predictable amount of time.     Precautions / Restrictions Precautions Precautions: Fall Restrictions Weight Bearing Restrictions: No      Mobility  Bed Mobility Overal bed mobility: Independent                  Transfers Overall transfer level: Needs assistance Equipment used: None Transfers: Sit to/from Stand Sit to Stand: Supervision                Ambulation/Gait Ambulation/Gait assistance: Supervision Gait Distance (Feet): 200  Feet Assistive device: None Gait Pattern/deviations: Step-through pattern Gait velocity: functional Gait velocity interpretation: 1.31 - 2.62 ft/sec, indicative of limited community ambulator   General Gait Details: pt with mild lateral drift, dancing at times in the hallway. Pt often stops to talk to staff, difficult to maintain attention to task at hand  Stairs            Wheelchair Mobility     Tilt Bed    Modified Rankin (Stroke Patients Only)       Balance Overall balance assessment: Needs assistance Sitting-balance support: No upper extremity supported, Feet supported Sitting balance-Leahy Scale: Good     Standing balance support: No upper extremity supported, During functional activity Standing balance-Leahy Scale: Good                               Pertinent Vitals/Pain Pain Assessment Pain Assessment: No/denies pain    Home Living Family/patient expects to be discharged to:: Private residence Living Arrangements: Spouse/significant other Available Help at Discharge: Family;Available 24 hours/day Type of Home: House Home Access: Stairs to enter Entrance Stairs-Rails: Left Entrance Stairs-Number of Steps: 6   Home Layout: Two level Home Equipment: None      Prior Function Prior Level of Function : Needs assist             Mobility Comments: pt mobilizes independently, mows the lawn ADLs Comments: assist for IADLs     Hand Dominance        Extremity/Trunk Assessment   Upper Extremity Assessment Upper Extremity Assessment: Overall WFL for tasks assessed    Lower Extremity Assessment Lower Extremity Assessment: Generalized  weakness    Cervical / Trunk Assessment Cervical / Trunk Assessment: Normal  Communication   Communication: No difficulties  Cognition Arousal/Alertness: Awake/alert Behavior During Therapy: Impulsive Overall Cognitive Status: History of cognitive impairments - at baseline                                  General Comments: pt with dementia, oriented to person, very poor short term memory, perseverates on same phrases throughout session        General Comments General comments (skin integrity, edema, etc.): VSS on RA, pt reports lightheadedness intermittently during session, BP stable 127/73 at end of ambulation, HR in 90s when mobilizing    Exercises     Assessment/Plan    PT Assessment Patient needs continued PT services  PT Problem List Decreased strength;Decreased activity tolerance;Decreased balance       PT Treatment Interventions DME instruction;Gait training;Stair training;Functional mobility training;Balance training;Neuromuscular re-education;Cognitive remediation    PT Goals (Current goals can be found in the Care Plan section)  Acute Rehab PT Goals Patient Stated Goal: to go home PT Goal Formulation: With patient Time For Goal Achievement: 04/06/23 Potential to Achieve Goals: Good    Frequency Min 1X/week     Co-evaluation               AM-PAC PT "6 Clicks" Mobility  Outcome Measure Help needed turning from your back to your side while in a flat bed without using bedrails?: None Help needed moving from lying on your back to sitting on the side of a flat bed without using bedrails?: None Help needed moving to and from a bed to a chair (including a wheelchair)?: A Little Help needed standing up from a chair using your arms (e.g., wheelchair or bedside chair)?: A Little Help needed to walk in hospital room?: A Little Help needed climbing 3-5 steps with a railing? : A Little 6 Click Score: 20    End of Session Equipment Utilized During Treatment: Gait belt Activity Tolerance: Patient tolerated treatment well Patient left: in bed;with call bell/phone within reach;with bed alarm set Nurse Communication: Mobility status PT Visit Diagnosis: Other abnormalities of gait and mobility (R26.89)    Time: 5621-3086 PT Time Calculation (min)  (ACUTE ONLY): 22 min   Charges:   PT Evaluation $PT Eval Low Complexity: 1 Low   PT General Charges $$ ACUTE PT VISIT: 1 Visit         Arlyss Gandy, PT, DPT Acute Rehabilitation Office [phone]   Arlyss Gandy 03/23/2023, 12:24 PM

## 2023-03-23 NOTE — Progress Notes (Addendum)
    Gastroenterology Inpatient Follow Up    Subjective: Denies blood in stools. Patient is back to baseline mental status per family member at bedside  Objective: Vital signs in last 24 hours: Temp:  [97.6 F (36.4 C)-98.8 F (37.1 C)] 98.3 F (36.8 C) (07/29 1202) Pulse Rate:  [53-65] 53 (07/29 1202) Resp:  [17-18] 17 (07/29 1202) BP: (98-127)/(49-73) 127/73 (07/29 1202) SpO2:  [98 %-100 %] 100 % (07/29 1202) Last BM Date : 03/20/23  Intake/Output from previous day: 07/28 0701 - 07/29 0700 In: 450 [I.V.:150; Blood:300] Out: 115 [Urine:115] Intake/Output this shift: Total I/O In: 240 [P.O.:240] Out: -   General appearance: alert and cooperative Resp: no increased WOB Cardio: bradycardic GI: non-tender, non-distended  Lab Results: Recent Labs    03/21/23 0448 03/21/23 1051 03/22/23 0406 03/22/23 1922 03/23/23 0406  WBC 8.2  --  7.7  --  6.8  HGB 7.5*   < > 7.2* 8.5* 8.4*  HCT 22.0*   < > 21.3* 25.2* 24.5*  PLT 150  --  128*  --  121*   < > = values in this interval not displayed.   BMET Recent Labs    03/21/23 0448 03/21/23 1051 03/22/23 0406 03/23/23 0406  NA 139 143 138 139  K 3.6 4.1 3.6 3.6  CL 110 111 110 111  CO2 21*  --  21* 22  GLUCOSE 173* 177* 145* 69*  BUN 56* 53* 55* 42*  CREATININE 1.87* 2.10* 1.83* 1.56*  CALCIUM 8.0*  --  7.8* 8.2*   LFT Recent Labs    03/23/23 0406  PROT 4.8*  ALBUMIN 2.7*  AST 17  ALT 12  ALKPHOS 26*  BILITOT 0.5   PT/INR No results for input(s): "LABPROT", "INR" in the last 72 hours. Hepatitis Panel No results for input(s): "HEPBSAG", "HCVAB", "HEPAIGM", "HEPBIGM" in the last 72 hours. C-Diff No results for input(s): "CDIFFTOX" in the last 72 hours.  Studies/Results: No results found.  Medications: I have reviewed the patient's current medications. Scheduled:  sodium chloride   Intravenous Once   [START ON 03/24/2023] aspirin  81 mg Oral Daily   feeding supplement (GLUCERNA SHAKE)  237 mL Oral  BID BM   finasteride  5 mg Oral Daily   gabapentin  100 mg Oral QHS   insulin aspart  0-5 Units Subcutaneous QHS   insulin aspart  0-9 Units Subcutaneous TID WC   insulin glargine-yfgn  6 Units Subcutaneous Daily   pantoprazole  40 mg Oral Daily   predniSONE  5 mg Oral Daily   tamsulosin  0.4 mg Oral Daily   Continuous:  sodium chloride     JXB:[id number] **OR** acetaminophen, albuterol, ondansetron **OR** ondansetron (ZOFRAN) IV, polyvinyl alcohol  Assessment/Plan: 89 year old male with history of diabetes, severe AS status post TAVR, known diverticulosis and prior history of GI bleed, admitted after syncopal episode on the toilet with large bloody bowel movement.  EGD 7/27 showed erosive gastropathy and a duodenal ulcer with a non-bleeding visible vessel that was treated with BiCap and clipping.  Recommend PPI twice daily for 8 weeks, followed by QD.  Patient's hemoglobin has remained stable, he has had no further evidence of bleeding. Okay to resume aspirin. Avoid NSAID use if possible. Will check iron levels to see if iron supplementation is needed (suspect that he will likely need iron). GI will sign off for now.   LOS: 3 days   Imogene Burn 03/23/2023, 3:36 PM

## 2023-03-23 NOTE — Care Management Important Message (Signed)
Important Message  Patient Details  Name: Brian [name] MRN: [id number] Date of Birth: [birth year]   Medicare Important Message Given:  Yes     Kajsa Butrum Stefan Church 03/23/2023, 3:54 PM

## 2023-03-23 NOTE — TOC Progression Note (Addendum)
Transition of Care [facility]) - Progression Note    Patient Details  Name: Brian [name] MRN: [id number] Date of Birth: October 26, 1933  Transition of Care [facility]) CM/SW Contact  Janae Bridgeman, RN Phone Number: 03/23/2023, 11:09 AM  Clinical Narrative:    CM called and left a message with Lenn Sink regarding patient's admission to the hospital and to obtain MSW contact for discharge planning purposes.  03/23/2023 1200 - I called and spoke with April, CM at [facility] and patient sees Dr. Moshe Cipro at [facility].  Oliver Barre, MSW is SW at [place] - [phone] x [phone].  Patient was evaluated by PT and home health services are needed.  Home health orders were placed and the wife, at bedside did not have a preference.  I called [facility] and Cory, CM accepted for [facility] services.  The patient has RW at home and grab bars in the home for safety.  The wife states that patient continues to drive and I recommended that this may not be the safest for patient to drive and patient's wife plans to follow up with PCP.   Expected Discharge Plan: Home/Self Care Barriers to Discharge: Continued Medical Work up  Expected Discharge Plan and Services   Discharge Planning Services: CM Consult Post Acute Care Choice: Resumption of Svcs/PTA Provider Living arrangements for the past 2 months: Single Family Home                                       Social Determinants of Health (SDOH) Interventions SDOH Screenings   Food Insecurity: No Food Insecurity (03/19/2023)  Housing: Low Risk  (03/19/2023)  Transportation Needs: No Transportation Needs (03/19/2023)  Utilities: Not At Risk (03/19/2023)  Alcohol Screen: Low Risk  (12/29/2019)  Depression (PHQ2-9): Low Risk  (09/18/2020)  Financial Resource Strain: Low Risk  (09/18/2020)  Physical Activity: Inactive (09/18/2020)  Social Connections: Moderately Integrated (09/18/2020)  Stress: No Stress Concern Present (09/18/2020)  Tobacco Use: Medium Risk  (03/21/2023)    Readmission Risk Interventions     No data to display

## 2023-03-24 ENCOUNTER — Encounter (HOSPITAL_COMMUNITY): Payer: Self-pay | Admitting: Gastroenterology

## 2023-03-24 ENCOUNTER — Other Ambulatory Visit (HOSPITAL_COMMUNITY): Payer: Self-pay

## 2023-03-24 DIAGNOSIS — R55 Syncope and collapse: Secondary | ICD-10-CM | POA: Diagnosis not present

## 2023-03-24 LAB — GLUCOSE, CAPILLARY: Glucose-Capillary: 119 mg/dL — ABNORMAL HIGH (ref 70–99)

## 2023-03-24 LAB — SURGICAL PATHOLOGY

## 2023-03-24 MED ORDER — FERROUS SULFATE 325 (65 FE) MG PO TABS
325.0000 mg | ORAL_TABLET | ORAL | 0 refills | Status: DC
  Filled 2023-03-24: qty 100, 200d supply, fill #0

## 2023-03-24 MED ORDER — PANTOPRAZOLE SODIUM 40 MG PO TBEC
40.0000 mg | DELAYED_RELEASE_TABLET | Freq: Two times a day (BID) | ORAL | 0 refills | Status: AC
  Filled 2023-03-24: qty 120, 60d supply, fill #0

## 2023-03-24 NOTE — Plan of Care (Signed)

## 2023-03-24 NOTE — Progress Notes (Signed)
Delivered patient's TOC med to the nurses station

## 2023-03-24 NOTE — Discharge Summary (Addendum)
Physician Discharge Summary  Brian [name] EAV:[id number] DOB: 1934/04/23 DOA: 03/19/2023  PCP: Brian Cornwall, MD  Admit date: 03/19/2023 Discharge date: 03/24/2023  Time spent: 40 minutes  Recommendations for Outpatient Follow-up:  Follow outpatient CBC/CMP  Follow biopsy results with GI Holding chlorthalidone and lisinopril at discharge - follow BP outpatient    Discharge Diagnoses:  Principal Problem:   Syncope and collapse Active Problems:   Acute blood loss anemia   GI bleed   S/P TAVR (transcatheter aortic valve replacement)   Essential hypertension   Type 2 diabetes, controlled, with renal manifestation (HCC)   Chronic kidney disease (CKD), stage III (moderate) (HCC)   Dementia without behavioral disturbance (HCC)   BPH (benign prostatic hyperplasia)   Discharge Condition: stable  Diet recommendation: heart healthy diabetic  Filed Weights   03/20/23 0414 03/22/23 0613 03/24/23 0552  Weight: 60.2 kg 59.8 kg 60.1 kg    History of present illness:   Brian [name] is Brian [name] 89 y.o. male with medical history significant of hypertension, hyperlipidemia, congestive heart failure, severe aortic stenosis s/p TAVR, CKD stage III, inflammatory arthritis on chronic steroids, diverticulosis, dementia who presents after having episode of loss of consciousness.  Presented with GI bleeding.  Required 5 units pRBCs.  Found to have erosive gastropathy and non bleeding duodenal ulcer with visible vessel, treated.    Stable for discharge on 7/30.  See below for additional details.  Hospital Course:  Assessment and Plan:  Syncope and collapse  Acute blood loss anemia secondary to GI bleed Melena  -EGD with erosive gastropathy without stigmata of recent bleeding, non bleeding duodenal ulcer with nonbleeding visible vessel treated with bipolar cautery, clip placed - PPI BID x 8 weeks followed by daily PPI  - follow biopsy results - GI ok with resumption of aspirin -s/p 5 units  pRBC -discharge with iron    Severe aortic stenosis s/p TAVR Patient underwent TAVR back in 12/2019 asprin   Essential hypertension Blood pressures were noted to be soft as low as 101/80. Hold lisinopril chlorthalidone at discharge   Diabetes mellitus type 2, with long-term use of insulin On admission glucose elevated at 191.  Home medication regimen includes Lantus 6  units daily -Pharmacy substitution of Semglee 6 units daily -CBGs before every meal with very sensitive SSI -Adjust insulin regimen as needed   AKI on Chronic kidney disease stage IIIa Acute.  Creatinine 1.25 with BUN 37 which appears around patient's baseline when looking on care everywhere records. Peaked at 2.1, in setting of acute blood loss anemia and contrast exposure Creatinine improved Thiazide and ace remain on hold, continue to hold at discharge as blood pressures have been reasonable  Continue supportive care Strict I/O, daily weights   Inflammatory arthritis on chronic steroids Initially diagnosed by rheumatology at [facility] in 2021. -Continue prednisone and Plaquenil   Dementia, without behavioral disturbance -Delirium precautions   BPH -Continue Proscar and Flomax   Awaiting PT/OT evals - no follow up needed    Procedures: EGD  - Normal esophagus. - Erosive gastropathy with no stigmata of recent bleeding. Biopsied. - Non- bleeding duodenal ulcer with Brian [name] nonbleeding visible vessel ( Forrest Class IIa) . Treated with bipolar cautery. Clip manufacturer: [employer]. Clip ( MR safe) was placed. Recommendation: - Return patient to hospital ward for ongoing care. - Resume regular diet. - Continue present medications. - Await pathology results. - PPI QD. - Follow HGB and transfuse as necessary.  Consultations: GI  Discharge Exam: Vitals:  03/24/23 0438 03/24/23 0728  BP: (!) 130/54 112/64  Pulse: (!) 51 65  Resp:    Temp: 97.8 F (36.6 C) 97.6 F (36.4 C)  SpO2: 100% 100%   No  complaints Pleasantly confused Daughter at bedside Rough night, they were cleaning  General: No acute distress. Cardiovascular: unlabored Lungs: RRR Abdomen: Soft, nontender, nondistended  Neurological: Alert ,  pleasantly confused. Moves all extremities 4 with equal strength. Cranial nerves II through XII grossly intact. Extremities: No clubbing or cyanosis. No edema.   Discharge Instructions   Discharge Instructions     Call MD for:  difficulty breathing, headache or visual disturbances   Complete by: As directed    Call MD for:  extreme fatigue   Complete by: As directed    Call MD for:  hives   Complete by: As directed    Call MD for:  persistant dizziness or light-headedness   Complete by: As directed    Call MD for:  persistant nausea and vomiting   Complete by: As directed    Call MD for:  redness, tenderness, or signs of infection (pain, swelling, redness, odor or green/yellow discharge around incision site)   Complete by: As directed    Call MD for:  severe uncontrolled pain   Complete by: As directed    Call MD for:  temperature >100.4   Complete by: As directed    Diet - low sodium heart healthy   Complete by: As directed    Discharge instructions   Complete by: As directed    You were seen for Brian [name] gastrointestinal bleed.  Your EGD showed erosive gastropathy and Brian [name] duodenal ulcer.  The ulcer was treated with cautery and Brian [name] clip.  The erosive gastropathy was biopsied and the results are pending.  Follow up with gastroenterology regarding these results.   You've been started on protonix to take twice daily for 2 months.  After 2 months, take protonix 40 mg daily.    Avoid NSAIDs (ibuprofen, naproxen, aleve, motrin, etc.).    We'll start you on iron to take every other day.   We stopped your blood pressure medications.  Your blood pressures are reasonable.  Don't resume these until you follow up with your PCP.   Return for new, recurrent, or worsening  symptoms.  Please ask your PCP to request records from this hospitalization so they know what was done and what the next steps will be.   Increase activity slowly   Complete by: As directed       Allergies as of 03/24/2023       Reactions   Amlodipine    Carvedilol Other (See Comments)   bradycardia   Niaspan [niacin Er]    Shellfish Allergy Other (See Comments)   Pt vomits   Statins Other (See Comments)   Myalgia   Sulfa Antibiotics    Zetia [ezetimibe]    Januvia [sitagliptin] Rash        Medication List     STOP taking these medications    chlorthalidone 25 MG tablet Commonly known as: HYGROTON   IRON PO   lisinopril 40 MG tablet Commonly known as: ZESTRIL       TAKE these medications    Accu-Chek Guide test strip Generic drug: glucose blood USE UP TO 4 TIMES Nabeel Gladson DAY AS DIRECTED   Accu-Chek Softclix Lancets lancets   aspirin EC 81 MG tablet Take 81 mg by mouth daily.   docusate sodium 100 MG capsule Commonly known  as: DOK Take 1 capsule (100 mg total) by mouth 2 (two) times daily as needed.   ferrous sulfate 325 (65 FE) MG EC tablet Take 1 tablet (325 mg total) by mouth every other day.   finasteride 5 MG tablet Commonly known as: PROSCAR Take 1 tablet (5 mg total) by mouth daily.   Fish Oil 1000 MG Caps Take 1 capsule (1,000 mg total) by mouth daily.   gabapentin 100 MG capsule Commonly known as: NEURONTIN Take 100 mg by mouth at bedtime.   insulin glargine 100 UNIT/ML injection Commonly known as: LANTUS Inject 6 Units into the skin daily.   Insulin Pen Needle 31G X 5 MM Misc Commonly known as: B-D UF III MINI PEN NEEDLES USE AS DIRECTED WITH LEVEMIR INSULIN PENS AT BEDTIME, once Feather Berrie day   pantoprazole 40 MG tablet Commonly known as: PROTONIX Take 1 tablet (40 mg total) by mouth 2 (two) times daily. After completing 2 months, take 40 mg daily.   predniSONE 5 MG tablet Commonly known as: DELTASONE Take 1 tablet by mouth daily.    tamsulosin 0.4 MG Caps capsule Commonly known as: FLOMAX TAKE 1 CAPSULE BY MOUTH EVERY DAY       Allergies  Allergen Reactions   Amlodipine    Carvedilol Other (See Comments)    bradycardia   Niaspan [Niacin Er]    Shellfish Allergy Other (See Comments)    Pt vomits   Statins Other (See Comments)    Myalgia   Sulfa Antibiotics    Zetia [Ezetimibe]    Januvia [Sitagliptin] Rash    Follow-up Information     [facility], [facility] Follow up.   Specialty: Home Health Services Why: [facility] will be providing home health services.  They will call you to start services in the next 24-48 hours. Contact information: [address] [place] [address] [phone]         [facility] Follow up.   Specialty: Gastroenterology Why: Follow up regarding pending biopsy results Contact information: [address] [place] [address] [phone]        Brian Cornwall, MD Follow up.   Specialty: Family Medicine Contact information: [address] [place] [address] [phone]                  The results of significant diagnostics from this hospitalization (including imaging, microbiology, ancillary and laboratory) are listed below for reference.    Significant Diagnostic Studies: CT ANGIO GI BLEED  Result Date: 03/19/2023 CLINICAL DATA:  Mesenteric ischemia, acute. Per triage note, bright red blood per rectum and syncopal episode this morning. EXAM: CTA ABDOMEN AND PELVIS WITHOUT AND WITH CONTRAST TECHNIQUE: Multidetector CT imaging of the abdomen and pelvis was performed using the standard protocol during bolus administration of intravenous contrast. Multiplanar reconstructed images and MIPs were obtained and reviewed to evaluate the vascular anatomy. RADIATION DOSE REDUCTION: This exam was performed according to the departmental dose-optimization program which includes  automated exposure control, adjustment of the mA and/or kV according to patient size and/or use of iterative reconstruction technique. CONTRAST:  OMNIPAQUE IOHEXOL 350 MG/ML SOLN COMPARISON:  CTA chest/abdomen/pelvis 01/18/2020. FINDINGS: VASCULAR Aorta: Normal caliber aorta without aneurysm, dissection, vasculitis or significant stenosis. Extensive atherosclerotic vascular changes. Celiac: Patent without evidence of aneurysm, dissection, vasculitis or significant stenosis. SMA: Patent without evidence of aneurysm, dissection, vasculitis or significant stenosis. Renals: Both renal arteries are patent without evidence of aneurysm, dissection,  vasculitis, fibromuscular dysplasia or significant stenosis. Mild atherosclerotic vascular changes at the origins. IMA: Patent without evidence of aneurysm, dissection, vasculitis or significant stenosis. Inflow: Patent without evidence of aneurysm, dissection, vasculitis or significant stenosis. Moderate atherosclerotic vascular changes. Proximal Outflow: Bilateral common femoral and visualized portions of the superficial and profunda femoral arteries are patent without evidence of aneurysm, dissection, vasculitis or significant stenosis. Moderate atherosclerotic vascular changes. Veins: Patent. Review of the MIP images confirms the above findings. NON-VASCULAR Lower chest: No acute abnormality.  Coronary artery calcifications. Hepatobiliary: No focal liver abnormality is seen. No gallstones, gallbladder wall thickening, or biliary dilatation. Pancreas: Unremarkable. No pancreatic ductal dilatation or surrounding inflammatory changes. Spleen: Normal. Adrenals/Urinary Tract: The adrenal glands are unremarkable. Punctate nonobstructing renal calculi in the upper pole right kidney (axial image 18 series 5). No hydronephrosis. Periureteral diverticulum of the left posterior bladder wall. Stomach/Bowel: Normal stomach and duodenum. No dilated loops of small bowel. Normal  appendix is visualized on axial image 89 series 17. Extensive colonic diverticulosis. No surrounding inflammation to suggest acute diverticulitis. No evidence of active extravasation of contrast to localize the patient's reported lower GI bleeding. Lymphatic: No abdominal or pelvic lymphadenopathy. Reproductive: Prostate is unremarkable. Other: No abdominal wall hernia or abnormality. No abdominopelvic ascites. Musculoskeletal: Degenerative changes of the right-greater-than-left hip joints. No acute bone findings. IMPRESSION: 1. No evidence of active extravasation of contrast to localize the patient's reported lower GI bleeding. No evidence of mesenteric ischemia. 2. Extensive colonic diverticulosis without evidence of acute diverticulitis. 3. Punctate nonobstructing renal calculi in the upper pole right kidney. No hydronephrosis. 4. Coronary atherosclerosis. Aortic Atherosclerosis (ICD10-I70.0). Electronically Signed   By: Orvan Falconer M.D.   On: 03/19/2023 10:39    Microbiology: No results found for this or any previous visit (from the past 240 hour(s)).   Labs: Basic Metabolic Panel: Recent Labs  Lab 03/20/23 0314 03/21/23 0448 03/21/23 1051 03/22/23 0406 03/23/23 0406 03/24/23 0016  NA 138 139 143 138 139 138  K 4.0 3.6 4.1 3.6 3.6 3.8  CL 109 110 111 110 111 111  CO2 20* 21*  --  21* 22 20*  GLUCOSE 247* 173* 177* 145* 69* 137*  BUN 53* 56* 53* 55* 42* 36*  CREATININE 1.37* 1.87* 2.10* 1.83* 1.56* 1.30*  CALCIUM 8.3* 8.0*  --  7.8* 8.2* 8.1*  MG  --  1.9  --  1.9 2.0  --   PHOS  --  4.0  --  4.0 2.9  --    Liver Function Tests: Recent Labs  Lab 03/19/23 0739 03/21/23 0448 03/22/23 0406 03/23/23 0406  AST 17 17 13* 17  ALT 11 10 9 12   ALKPHOS 30* 19* 20* 26*  BILITOT 0.5 0.6 0.7 0.5  PROT 5.2* 4.7* 4.6* 4.8*  ALBUMIN 2.8* 2.6* 2.7* 2.7*   Recent Labs  Lab 03/19/23 0739  LIPASE 50   No results for input(s): "AMMONIA" in the last 168 hours. CBC: Recent Labs  Lab  03/19/23 0739 03/19/23 2306 03/20/23 0314 03/20/23 1734 03/21/23 0448 03/21/23 1051 03/21/23 1750 03/22/23 0406 03/22/23 1922 03/23/23 0406 03/24/23 0016  WBC 9.2  --  10.4  --  8.2  --   --  7.7  --  6.8 6.1  NEUTROABS 6.7  --   --   --  5.2  --   --  5.1  --  4.5  --   HGB 7.6*   < > 7.3*   < > 7.5*   < > 7.1*  7.2* 8.5* 8.4* 8.8*  HCT 24.8*   < > 22.0*   < > 22.0*   < > 21.1* 21.3* 25.2* 24.5* 25.2*  MCV 90.5  --  86.3  --  88.4  --   --  87.7  --  86.3 86.6  PLT 199  --  157  --  150  --   --  128*  --  121* 129*   < > = values in this interval not displayed.   Cardiac Enzymes: No results for input(s): "CKTOTAL", "CKMB", "CKMBINDEX", "TROPONINI" in the last 168 hours. BNP: BNP (last 3 results) No results for input(s): "BNP" in the last 8760 hours.  ProBNP (last 3 results) No results for input(s): "PROBNP" in the last 8760 hours.  CBG: Recent Labs  Lab 03/23/23 0731 03/23/23 1122 03/23/23 1621 03/23/23 2108 03/24/23 0736  GLUCAP 107* 151* 230* 208* 119*       Signed:  Lacretia Nicks MD.  [facility] 03/24/2023, 9:05 AM

## 2023-03-24 NOTE — Plan of Care (Signed)

## 2023-06-12 ENCOUNTER — Other Ambulatory Visit (HOSPITAL_COMMUNITY): Payer: Self-pay

## 2023-09-30 ENCOUNTER — Other Ambulatory Visit: Payer: Self-pay

## 2023-09-30 ENCOUNTER — Emergency Department (HOSPITAL_COMMUNITY): Payer: No Typology Code available for payment source

## 2023-09-30 ENCOUNTER — Observation Stay (HOSPITAL_COMMUNITY)
Admission: EM | Admit: 2023-09-30 | Discharge: 2023-10-01 | Disposition: A | Discharge status: Home / Self Care | Payer: No Typology Code available for payment source | Attending: Hospitalist | Admitting: Hospitalist

## 2023-09-30 DIAGNOSIS — N400 Enlarged prostate without lower urinary tract symptoms: Secondary | ICD-10-CM | POA: Diagnosis not present

## 2023-09-30 DIAGNOSIS — I130 Hypertensive heart and chronic kidney disease with heart failure and stage 1 through stage 4 chronic kidney disease, or unspecified chronic kidney disease: Secondary | ICD-10-CM | POA: Insufficient documentation

## 2023-09-30 DIAGNOSIS — Z79899 Other long term (current) drug therapy: Secondary | ICD-10-CM | POA: Insufficient documentation

## 2023-09-30 DIAGNOSIS — N1832 Chronic kidney disease, stage 3b: Secondary | ICD-10-CM | POA: Diagnosis not present

## 2023-09-30 DIAGNOSIS — F0390 Unspecified dementia without behavioral disturbance: Secondary | ICD-10-CM | POA: Insufficient documentation

## 2023-09-30 DIAGNOSIS — E1122 Type 2 diabetes mellitus with diabetic chronic kidney disease: Secondary | ICD-10-CM | POA: Insufficient documentation

## 2023-09-30 DIAGNOSIS — Z1152 Encounter for screening for COVID-19: Secondary | ICD-10-CM | POA: Insufficient documentation

## 2023-09-30 DIAGNOSIS — D631 Anemia in chronic kidney disease: Secondary | ICD-10-CM | POA: Insufficient documentation

## 2023-09-30 DIAGNOSIS — R195 Other fecal abnormalities: Secondary | ICD-10-CM | POA: Diagnosis present

## 2023-09-30 DIAGNOSIS — A0472 Enterocolitis due to Clostridium difficile, not specified as recurrent: Secondary | ICD-10-CM

## 2023-09-30 DIAGNOSIS — I5032 Chronic diastolic (congestive) heart failure: Secondary | ICD-10-CM | POA: Insufficient documentation

## 2023-09-30 DIAGNOSIS — Z7982 Long term (current) use of aspirin: Secondary | ICD-10-CM | POA: Insufficient documentation

## 2023-09-30 DIAGNOSIS — Z87891 Personal history of nicotine dependence: Secondary | ICD-10-CM | POA: Diagnosis not present

## 2023-09-30 DIAGNOSIS — R55 Syncope and collapse: Principal | ICD-10-CM | POA: Insufficient documentation

## 2023-09-30 DIAGNOSIS — Z952 Presence of prosthetic heart valve: Secondary | ICD-10-CM | POA: Diagnosis not present

## 2023-09-30 DIAGNOSIS — D638 Anemia in other chronic diseases classified elsewhere: Secondary | ICD-10-CM | POA: Diagnosis present

## 2023-09-30 DIAGNOSIS — R197 Diarrhea, unspecified: Secondary | ICD-10-CM | POA: Diagnosis not present

## 2023-09-30 DIAGNOSIS — R001 Bradycardia, unspecified: Secondary | ICD-10-CM | POA: Diagnosis present

## 2023-09-30 DIAGNOSIS — E119 Type 2 diabetes mellitus without complications: Secondary | ICD-10-CM

## 2023-09-30 DIAGNOSIS — Z794 Long term (current) use of insulin: Secondary | ICD-10-CM | POA: Insufficient documentation

## 2023-09-30 LAB — CBC WITH DIFFERENTIAL/PLATELET
Abs Immature Granulocytes: 0.03 10*3/uL (ref 0.00–0.07)
Basophils Absolute: 0 10*3/uL (ref 0.0–0.1)
Basophils Relative: 0 %
Eosinophils Absolute: 0.4 10*3/uL (ref 0.0–0.5)
Eosinophils Relative: 7 %
HCT: 31.6 % — ABNORMAL LOW (ref 39.0–52.0)
Hemoglobin: 10.2 g/dL — ABNORMAL LOW (ref 13.0–17.0)
Immature Granulocytes: 1 %
Lymphocytes Relative: 46 %
Lymphs Abs: 2.6 10*3/uL (ref 0.7–4.0)
MCH: 28.7 pg (ref 26.0–34.0)
MCHC: 32.3 g/dL (ref 30.0–36.0)
MCV: 88.8 fL (ref 80.0–100.0)
Monocytes Absolute: 0.4 10*3/uL (ref 0.1–1.0)
Monocytes Relative: 6 %
Neutro Abs: 2.3 10*3/uL (ref 1.7–7.7)
Neutrophils Relative %: 40 %
Platelets: 217 10*3/uL (ref 150–400)
RBC: 3.56 MIL/uL — ABNORMAL LOW (ref 4.22–5.81)
RDW: 14.7 % (ref 11.5–15.5)
WBC: 5.7 10*3/uL (ref 4.0–10.5)
nRBC: 0 % (ref 0.0–0.2)

## 2023-09-30 NOTE — ED Triage Notes (Signed)
 Pt BIB [facility] from home. Per wife pt was sitting at table and pt went unresponsive. Pts wife started CPR, on EMS arrival pt had pulse but was  unresponsive. Per EMS pt HR was in the 30s and was experiencing impending doom. Pt denies Cp/SOB. Pt is confused; unknown if this baseline.

## 2023-10-01 ENCOUNTER — Observation Stay (HOSPITAL_COMMUNITY): Payer: No Typology Code available for payment source

## 2023-10-01 ENCOUNTER — Encounter (HOSPITAL_COMMUNITY): Payer: Self-pay | Admitting: Internal Medicine

## 2023-10-01 DIAGNOSIS — N400 Enlarged prostate without lower urinary tract symptoms: Secondary | ICD-10-CM

## 2023-10-01 DIAGNOSIS — R195 Other fecal abnormalities: Secondary | ICD-10-CM

## 2023-10-01 DIAGNOSIS — D638 Anemia in other chronic diseases classified elsewhere: Secondary | ICD-10-CM

## 2023-10-01 DIAGNOSIS — N1832 Chronic kidney disease, stage 3b: Secondary | ICD-10-CM | POA: Diagnosis not present

## 2023-10-01 DIAGNOSIS — R55 Syncope and collapse: Principal | ICD-10-CM

## 2023-10-01 DIAGNOSIS — I5032 Chronic diastolic (congestive) heart failure: Secondary | ICD-10-CM

## 2023-10-01 DIAGNOSIS — E1122 Type 2 diabetes mellitus with diabetic chronic kidney disease: Secondary | ICD-10-CM

## 2023-10-01 DIAGNOSIS — Z794 Long term (current) use of insulin: Secondary | ICD-10-CM

## 2023-10-01 LAB — CBC WITH DIFFERENTIAL/PLATELET
Abs Immature Granulocytes: 0.02 10*3/uL (ref 0.00–0.07)
Basophils Absolute: 0 10*3/uL (ref 0.0–0.1)
Basophils Relative: 0 %
Eosinophils Absolute: 0.1 10*3/uL (ref 0.0–0.5)
Eosinophils Relative: 1 %
HCT: 26.8 % — ABNORMAL LOW (ref 39.0–52.0)
Hemoglobin: 8.7 g/dL — ABNORMAL LOW (ref 13.0–17.0)
Immature Granulocytes: 0 %
Lymphocytes Relative: 17 %
Lymphs Abs: 1.1 10*3/uL (ref 0.7–4.0)
MCH: 28.7 pg (ref 26.0–34.0)
MCHC: 32.5 g/dL (ref 30.0–36.0)
MCV: 88.4 fL (ref 80.0–100.0)
Monocytes Absolute: 0.5 10*3/uL (ref 0.1–1.0)
Monocytes Relative: 8 %
Neutro Abs: 4.9 10*3/uL (ref 1.7–7.7)
Neutrophils Relative %: 74 %
Platelets: 184 10*3/uL (ref 150–400)
RBC: 3.03 MIL/uL — ABNORMAL LOW (ref 4.22–5.81)
RDW: 14.8 % (ref 11.5–15.5)
WBC: 6.7 10*3/uL (ref 4.0–10.5)
nRBC: 0 % (ref 0.0–0.2)

## 2023-10-01 LAB — COMPREHENSIVE METABOLIC PANEL
ALT: 11 U/L (ref 0–44)
ALT: 11 U/L (ref 0–44)
AST: 20 U/L (ref 15–41)
AST: 19 U/L (ref 15–41)
Albumin: 3.7 g/dL (ref 3.5–5.0)
Albumin: 3 g/dL — ABNORMAL LOW (ref 3.5–5.0)
Alkaline Phosphatase: 58 U/L (ref 38–126)
Alkaline Phosphatase: 41 U/L (ref 38–126)
Anion gap: 6 (ref 5–15)
Anion gap: 8 (ref 5–15)
BUN: 21 mg/dL (ref 8–23)
BUN: 24 mg/dL — ABNORMAL HIGH (ref 8–23)
CO2: 24 mmol/L (ref 22–32)
CO2: 23 mmol/L (ref 22–32)
Calcium: 9.6 mg/dL (ref 8.9–10.3)
Calcium: 8.8 mg/dL — ABNORMAL LOW (ref 8.9–10.3)
Chloride: 108 mmol/L (ref 98–111)
Chloride: 108 mmol/L (ref 98–111)
Creatinine, Ser: 1.63 mg/dL — ABNORMAL HIGH (ref 0.61–1.24)
Creatinine, Ser: 1.55 mg/dL — ABNORMAL HIGH (ref 0.61–1.24)
GFR, Estimated: 40 mL/min — ABNORMAL LOW (ref >60)
GFR, Estimated: 43 mL/min — ABNORMAL LOW (ref >60)
Glucose, Bld: 209 mg/dL — ABNORMAL HIGH (ref 70–99)
Glucose, Bld: 224 mg/dL — ABNORMAL HIGH (ref 70–99)
Potassium: 4 mmol/L (ref 3.5–5.1)
Potassium: 4.5 mmol/L (ref 3.5–5.1)
Sodium: 138 mmol/L (ref 135–145)
Sodium: 139 mmol/L (ref 135–145)
Total Bilirubin: 0.5 mg/dL (ref 0.0–1.2)
Total Bilirubin: 0.4 mg/dL (ref 0.0–1.2)
Total Protein: 7.2 g/dL (ref 6.5–8.1)
Total Protein: 5.9 g/dL — ABNORMAL LOW (ref 6.5–8.1)

## 2023-10-01 LAB — URINALYSIS, ROUTINE W REFLEX MICROSCOPIC
Bacteria, UA: NONE SEEN
Bilirubin Urine: NEGATIVE
Glucose, UA: 50 mg/dL — AB
Hgb urine dipstick: NEGATIVE
Ketones, ur: NEGATIVE mg/dL
Leukocytes,Ua: NEGATIVE
Nitrite: NEGATIVE
Protein, ur: 30 mg/dL — AB
Specific Gravity, Urine: 1.014 (ref 1.005–1.030)
pH: 5 (ref 5.0–8.0)

## 2023-10-01 LAB — TROPONIN I (HIGH SENSITIVITY)
Troponin I (High Sensitivity): 6 ng/L (ref <18)
Troponin I (High Sensitivity): 7 ng/L (ref <18)
Troponin I (High Sensitivity): 8 ng/L (ref <18)

## 2023-10-01 LAB — RESP PANEL BY RT-PCR (RSV, FLU A&B, COVID)  RVPGX2
Influenza A by PCR: NEGATIVE
Influenza B by PCR: NEGATIVE
Resp Syncytial Virus by PCR: NEGATIVE
SARS Coronavirus 2 by RT PCR: NEGATIVE

## 2023-10-01 LAB — CBG MONITORING, ED
Glucose-Capillary: 180 mg/dL — ABNORMAL HIGH (ref 70–99)
Glucose-Capillary: 179 mg/dL — ABNORMAL HIGH (ref 70–99)
Glucose-Capillary: 161 mg/dL — ABNORMAL HIGH (ref 70–99)

## 2023-10-01 LAB — C DIFFICILE QUICK SCREEN W PCR REFLEX
C Diff antigen: POSITIVE — AB
C Diff toxin: NEGATIVE

## 2023-10-01 LAB — CLOSTRIDIUM DIFFICILE BY PCR, REFLEXED: Toxigenic C. Difficile by PCR: POSITIVE — AB

## 2023-10-01 LAB — BRAIN NATRIURETIC PEPTIDE: B Natriuretic Peptide: 113.8 pg/mL — ABNORMAL HIGH (ref 0.0–100.0)

## 2023-10-01 LAB — MAGNESIUM: Magnesium: 2 mg/dL (ref 1.7–2.4)

## 2023-10-01 MED ORDER — ASPIRIN 81 MG PO TBEC
81.0000 mg | DELAYED_RELEASE_TABLET | Freq: Every day | ORAL | Status: DC
Start: 2023-10-01 — End: 2023-10-01
  Administered 2023-10-01: 81 mg via ORAL
  Filled 2023-10-01: qty 1

## 2023-10-01 MED ORDER — INSULIN ASPART 100 UNIT/ML IJ SOLN
0.0000 [IU] | Freq: Three times a day (TID) | INTRAMUSCULAR | Status: DC
  Administered 2023-10-01 (×2): 2 [IU] via SUBCUTANEOUS

## 2023-10-01 MED ORDER — PANTOPRAZOLE SODIUM 40 MG PO TBEC
40.0000 mg | DELAYED_RELEASE_TABLET | Freq: Every day | ORAL | Status: DC
Start: 2023-10-01 — End: 2023-10-01
  Administered 2023-10-01: 40 mg via ORAL
  Filled 2023-10-01: qty 1

## 2023-10-01 MED ORDER — ACETAMINOPHEN 325 MG PO TABS: 650.0000 mg | ORAL_TABLET | Freq: Four times a day (QID) | ORAL | Status: DC | PRN

## 2023-10-01 MED ORDER — LISINOPRIL 20 MG PO TABS: 40.0000 mg | ORAL_TABLET | Freq: Every day | ORAL | Status: DC

## 2023-10-01 MED ORDER — VANCOMYCIN HCL 125 MG PO CAPS: 125.0000 mg | ORAL_CAPSULE | Freq: Three times a day (TID) | ORAL | 0 refills | Status: AC

## 2023-10-01 MED ORDER — ACETAMINOPHEN 650 MG RE SUPP: 650.0000 mg | Freq: Four times a day (QID) | RECTAL | Status: DC | PRN

## 2023-10-01 MED ORDER — LORAZEPAM 2 MG/ML IJ SOLN
1.0000 mg | Freq: Four times a day (QID) | INTRAMUSCULAR | Status: DC | PRN
  Administered 2023-10-01: 1 mg via INTRAVENOUS
  Filled 2023-10-01: qty 1

## 2023-10-01 MED ORDER — TAMSULOSIN HCL 0.4 MG PO CAPS
0.4000 mg | ORAL_CAPSULE | Freq: Every day | ORAL | Status: DC
  Administered 2023-10-01: 0.4 mg via ORAL
  Filled 2023-10-01: qty 1

## 2023-10-01 MED ORDER — QUETIAPINE FUMARATE 25 MG PO TABS: 25.0000 mg | ORAL_TABLET | Freq: Every evening | ORAL | Status: DC | PRN

## 2023-10-01 MED ORDER — HALOPERIDOL LACTATE 5 MG/ML IJ SOLN
2.0000 mg | Freq: Once | INTRAMUSCULAR | Status: DC
Start: 2023-10-01 — End: 2023-10-01

## 2023-10-01 MED ORDER — LISINOPRIL 40 MG PO TABS
40.0000 mg | ORAL_TABLET | Freq: Every day | ORAL | Status: DC
Start: 2023-10-01 — End: 2024-04-24

## 2023-10-01 MED ORDER — FERROUS SULFATE 325 (65 FE) MG PO TABS: 325.0000 mg | ORAL_TABLET | ORAL | Status: DC

## 2023-10-01 MED ORDER — HALOPERIDOL LACTATE 5 MG/ML IJ SOLN: 5.0000 mg | Freq: Once | INTRAMUSCULAR | Status: DC

## 2023-10-01 MED ORDER — HYDRALAZINE HCL 20 MG/ML IJ SOLN: 10.0000 mg | Freq: Four times a day (QID) | INTRAMUSCULAR | Status: DC | PRN

## 2023-10-01 MED ORDER — FINASTERIDE 5 MG PO TABS
5.0000 mg | ORAL_TABLET | Freq: Every day | ORAL | Status: DC
Start: 2023-10-01 — End: 2023-10-01
  Administered 2023-10-01: 5 mg via ORAL
  Filled 2023-10-01: qty 1

## 2023-10-01 MED ORDER — GABAPENTIN 100 MG PO CAPS: 100.0000 mg | ORAL_CAPSULE | Freq: Every day | ORAL | Status: DC

## 2023-10-01 MED ORDER — ALPRAZOLAM 0.25 MG PO TABS: 0.5000 mg | ORAL_TABLET | Freq: Three times a day (TID) | ORAL | Status: DC | PRN

## 2023-10-01 MED ORDER — VANCOMYCIN HCL 125 MG PO CAPS
125.0000 mg | ORAL_CAPSULE | Freq: Three times a day (TID) | ORAL | Status: DC
  Administered 2023-10-01 (×2): 125 mg via ORAL
  Filled 2023-10-01 (×6): qty 1

## 2023-10-01 MED ORDER — ONDANSETRON HCL 4 MG/2ML IJ SOLN: 4.0000 mg | Freq: Four times a day (QID) | INTRAMUSCULAR | Status: DC | PRN

## 2023-10-01 MED ORDER — MELATONIN 3 MG PO TABS: 3.0000 mg | ORAL_TABLET | Freq: Every evening | ORAL | Status: DC | PRN

## 2023-10-01 MED ORDER — LACTATED RINGERS IV SOLN: INTRAVENOUS | Status: AC

## 2023-10-01 NOTE — ED Provider Notes (Addendum)
  EMERGENCY DEPARTMENT AT [facility] Provider Note   CSN: [id number] Arrival date & time: 09/30/23  2317     History  Chief Complaint  Patient presents with  . Bradycardia    Brian [name] is a 89 y.o. male.  HPI     This is an 89 year old male with history of dementia, severe aortic stenosis status post TAVR, hypertension, diabetes who presents with an episode of loss of consciousness.  Patient does not provide any history.  Per EMS they were called out for loss of consciousness.  Wife was doing CPR on scene but patient did have a pulse upon their arrival.  Wife is now at the bedside.  She describes that they were sitting and having a snack when his head fell back and he became unresponsive.  No seizure-like activity was noted.  She pulled him to the floor and started CPR.  He is never done this before.  At baseline he has pretty severe dementia; however he usually does know where he is and date.  Patient has no physical complaints at this time.  Per EMS, they noted some bradycardia on the monitor.  That has not been captured here in the emergency department.  Home Medications Prior to Admission medications   Medication Sig Start Date End Date Taking? Authorizing Provider  aspirin  EC 81 MG tablet Take 81 mg by mouth daily.    Yes [provider]  docusate sodium  (DOK) 100 MG capsule Take 1 capsule (100 mg total) by mouth 2 (two) times daily as needed. Patient taking differently: Take 100 mg by mouth 2 (two) times daily as needed for mild constipation. 10/27/17  Yes [name], Newell SQUIBB, MD  ferrous sulfate  325 (65 FE) MG tablet Take 1 tablet (325 mg total) by mouth every other day. Patient taking differently: Take 325 mg by mouth every Monday, Wednesday, and Friday. 03/24/23 10/10/23 Yes Perri DELENA Meliton Mickey., MD  finasteride  (PROSCAR ) 5 MG tablet Take 1 tablet (5 mg total) by mouth daily. 06/30/19  Yes Tapia, Leisa, PA-C  gabapentin  (NEURONTIN ) 100 MG capsule  Take 100 mg by mouth at bedtime.   Yes [provider]  insulin  glargine (LANTUS ) 100 UNIT/ML injection Inject 5 Units into the skin daily.   Yes [provider]  Omega-3 Fatty Acids (FISH OIL ) 1000 MG CAPS Take 1 capsule (1,000 mg total) by mouth daily. 12/04/17  Yes Raford Riggs, MD  pantoprazole  (PROTONIX ) 40 MG tablet Take 1 tablet (40 mg total) by mouth 2 (two) times daily. After completing 2 months, take 40 mg daily. 03/24/23 09/30/24 Yes Perri DELENA Meliton Mickey., MD  QUEtiapine  (SEROQUEL ) 25 MG tablet Take 25 mg by mouth at bedtime as needed (for agitation). 09/22/23  Yes [provider]  tamsulosin  (FLOMAX ) 0.4 MG CAPS capsule TAKE 1 CAPSULE BY MOUTH EVERY DAY Patient taking differently: Take 0.4 mg by mouth daily. 11/03/19  Yes Tapia, Leisa, PA-C  ACCU-CHEK GUIDE test strip USE UP TO 4 TIMES A DAY AS DIRECTED 03/06/19   Rolly Almarie BRAVO, NP  Accu-Chek Softclix Lancets lancets  09/01/19   [provider]  Insulin  Pen Needle (B-D UF III MINI PEN NEEDLES) 31G X 5 MM MISC USE AS DIRECTED WITH LEVEMIR  INSULIN  PENS AT BEDTIME, once a day 10/20/18   Hinton Newell SQUIBB, MD  predniSONE  (DELTASONE ) 5 MG tablet Take 1 tablet by mouth daily. Patient not taking: Reported on 10/01/2023 11/28/20   [provider]  Allergies    Amlodipine , Carvedilol , Niaspan [niacin er (antihyperlipidemic)], Shellfish allergy, Statins, Sulfa antibiotics, Zetia [ezetimibe], and Januvia  [sitagliptin ]    Review of Systems   Review of Systems  Constitutional:  Negative for fever.  Respiratory:  Negative for shortness of breath.   Cardiovascular:  Negative for chest pain.  Gastrointestinal:  Negative for abdominal pain.  Neurological:  Positive for syncope.  Psychiatric/Behavioral:  Positive for confusion.   All other systems reviewed and are negative.   Physical Exam Updated Vital Signs BP 130/89   Pulse 68   Temp 98.1 F (36.7 C) (Rectal)   Resp 17   Ht 1.753 m (5' 9)    Wt 62 kg   SpO2 100%   BMI 20.18 kg/m  Physical Exam Vitals and nursing note reviewed.  Constitutional:      Appearance: He is well-developed.     Comments: Elderly, nontoxic-appearing  HENT:     Head: Normocephalic and atraumatic.     Mouth/Throat:     Mouth: Mucous membranes are moist.  Eyes:     Pupils: Pupils are equal, round, and reactive to light.  Cardiovascular:     Rate and Rhythm: Normal rate and regular rhythm.     Heart sounds: Murmur heard.  Pulmonary:     Effort: Pulmonary effort is normal. No respiratory distress.     Breath sounds: Normal breath sounds. No wheezing.  Abdominal:     Palpations: Abdomen is soft.     Tenderness: There is no abdominal tenderness. There is no rebound.  Musculoskeletal:     Cervical back: Neck supple.  Lymphadenopathy:     Cervical: No cervical adenopathy.  Skin:    General: Skin is warm and dry.  Neurological:     Mental Status: He is alert.     Comments: Oriented only to self, follows simple commands, moves all 4 extremities equally  Psychiatric:        Mood and Affect: Mood normal.     ED Results / Procedures / Treatments   Labs (all labs ordered are listed, but only abnormal results are displayed) Labs Reviewed  C DIFFICILE QUICK SCREEN W PCR REFLEX   - Abnormal; Notable for the following components:      Result Value   C Diff antigen POSITIVE (*)    All other components within normal limits  CBC WITH DIFFERENTIAL/PLATELET - Abnormal; Notable for the following components:   RBC 3.56 (*)    Hemoglobin 10.2 (*)    HCT 31.6 (*)    All other components within normal limits  COMPREHENSIVE METABOLIC PANEL - Abnormal; Notable for the following components:   Glucose, Bld 209 (*)    Creatinine, Ser 1.63 (*)    GFR, Estimated 40 (*)    All other components within normal limits  URINALYSIS, ROUTINE W REFLEX MICROSCOPIC - Abnormal; Notable for the following components:   Glucose, UA 50 (*)    Protein, ur 30 (*)    All  other components within normal limits  RESP PANEL BY RT-PCR (RSV, FLU A&B, COVID)  [id number]  CLOSTRIDIUM DIFFICILE BY PCR, REFLEXED  TROPONIN I (HIGH SENSITIVITY)  TROPONIN I (HIGH SENSITIVITY)    EKG EKG Interpretation Date/Time:  Wednesday September 30 2023 23:30:43 EST Ventricular Rate:  84 PR Interval:  165 QRS Duration:  88 QT Interval:  387 QTC Calculation: 461 R Axis:   40  Text Interpretation: Sinus rhythm Minimal ST elevation, inferior leads Confirmed by Bari Pfeiffer ([id number]) on 09/30/2023 11:56:41 PM  Radiology CT Head Wo Contrast Result Date: 10/01/2023 CLINICAL DATA:  Mental status change of unknown cause. Sudden onset of unresponsiveness. Decreased heart rate. EXAM: CT HEAD WITHOUT CONTRAST TECHNIQUE: Contiguous axial images were obtained from the base of the skull through the vertex without intravenous contrast. RADIATION DOSE REDUCTION: This exam was performed according to the departmental dose-optimization program which includes automated exposure control, adjustment of the mA and/or kV according to patient size and/or use of iterative reconstruction technique. COMPARISON:  None Available. FINDINGS: Brain: Diffuse cerebral atrophy. Ventricular dilatation consistent with central atrophy. Low-attenuation changes in the deep white matter consistent with small vessel ischemia. No abnormal extra-axial fluid collections. No mass effect or midline shift. Gray-white matter junctions are distinct. Basal cisterns are not effaced. No acute intracranial hemorrhage. Cavum septum pellucidum. Vascular: No hyperdense vessel or unexpected calcification. Skull: Normal. Negative for fracture or focal lesion. Sinuses/Orbits: No acute finding. Other: None. IMPRESSION: No acute intracranial abnormalities. Chronic atrophy and small vessel ischemic changes. Electronically Signed   By: Elsie Gravely M.D.   On: 10/01/2023 00:12   DG Chest Portable 1 View Result Date: 09/30/2023 CLINICAL DATA:   Bradycardia EXAM: PORTABLE CHEST 1 VIEW COMPARISON:  01/31/2020 FINDINGS: Lungs are clear. No pneumothorax or pleural effusion. Cardiac size within normal limits. Transcatheter aortic valve replacement has been performed. Pulmonary vascularity is normal. No acute bone abnormality IMPRESSION: 1. No active disease. Electronically Signed   By: Dorethia Molt M.D.   On: 09/30/2023 23:56    Procedures Procedures    Medications Ordered in ED Medications  vancomycin  (VANCOCIN ) capsule 125 mg (has no administration in time range)    ED Course/ Medical Decision Making/ A&P Clinical Course as of 10/01/23 0324  Thu Oct 01, 2023  0117 Informed by nursing that is having a fair bit of watery diarrhea.  Will add orthostatics, COVID, influenza, and C. difficile. [CH]  0323 Positive C. Difficile antigen.  PCR pending.  Oral vancomycin  ordered. [CH]    Clinical Course User Index [CH] Litzi Binning, Charmaine FALCON, MD                                 Medical Decision Making Amount and/or Complexity of Data Reviewed Labs: ordered. Radiology: ordered.  Risk Prescription drug management. Decision regarding hospitalization.   This patient presents to the ED for concern of loss of consciousness, this involves an extensive number of treatment options, and is a complaint that carries with it a high risk of complications and morbidity.  I considered the following differential and admission for this acute, potentially life threatening condition.  The differential diagnosis includes syncope, seizure, arrhythmia, less likely cardiac arrest  MDM:    This is an 89 year old male who presents with valuation for loss of consciousness.  He was reportedly bradycardic in route although we have not documented any of that here.  He is confused and only oriented to self.  Family reports that normally he would know where he was.  They do report that he has significant dementia.  Does not sound like he had any seizure-like activity.   Also, upon EMS arrival he appeared to have a pulse.  While his wife did give him CPR, have lower suspicion for true cardiac arrest.  Family does not report any recent illnesses.  Does not have any focal findings on exam.  EKG does not show any evidence of arrhythmia or ischemia.  Labs obtained and largely reassuring  including no metabolic derangements.  Nursing did report that he had large-volume very watery stool.  C. difficile testing and viral testing added.  CT head does not show any evidence of acute abnormalities.  Troponin negative.  Have requested orthostatics.  Given his age and concern for loss of consciousness/syncope, will admit for observation and telemetry.  C. difficile testing is pending.  His abdomen is otherwise benign.  (Labs, imaging, consults)  Labs: I Ordered, and personally interpreted labs.  The pertinent results include: CBC, CMP, troponin, urinalysis, COVID, influenza  Imaging Studies ordered: I ordered imaging studies including CT head, chest x-ray I independently visualized and interpreted imaging. I agree with the radiologist interpretation  Additional history obtained from family at bedside, EMS.  External records from outside source obtained and reviewed including prior evaluations  Cardiac Monitoring: .The patient was maintained on a cardiac monitor.  If on the cardiac monitor, I personally viewed and interpreted the cardiac monitored which showed an underlying rhythm of: Sinus  Reevaluation: After the interventions noted above, I reevaluated the patient and found that they have :stayed the same  Social Determinants of Health: . lives with wife  Disposition: Admit  Co morbidities that complicate the patient evaluation . Past Medical History:  Diagnosis Date  . Anemia of chronic renal failure 05/08/2015  . Aortic stenosis, severe 09/21/2015  . Arthritis   . Bladder stone   . Chronic kidney disease (CKD), stage III (moderate) (HCC) 05/08/2015   Followed by  Dr. Jaclynn   . Congestive heart failure (HCC) 03/14/2019  . Dementia [facility])    patient will wander off if wife is not present  . Diastolic dysfunction without heart failure 06/08/2017  . Essential hypertension 05/08/2015  . History of bladder stone   . Hyperlipidemia   . Hypertension   . Nocturia   . Renal disorder   . 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 Sampson and Dusty  . Type 2 diabetes mellitus (HCC)      Medicines Meds ordered this encounter  Medications  . vancomycin  (VANCOCIN ) capsule 125 mg    I have reviewed the patients home medicines and have made adjustments as needed  Problem List / ED Course: Problem List Items Addressed This Visit   None Visit Diagnoses       Syncope, unspecified syncope type    -  Primary     C. difficile diarrhea       Relevant Medications   vancomycin  (VANCOCIN ) capsule 125 mg (Start on 10/01/2023 10:00 AM)                   Final Clinical Impression(s) / ED Diagnoses Final diagnoses:  Syncope, unspecified syncope type  C. difficile diarrhea    Rx / DC Orders ED Discharge Orders     None         Roch Quach, Charmaine FALCON, MD 10/01/23 0301    Bari Charmaine FALCON, MD 10/01/23 [phone]

## 2023-10-01 NOTE — ED Notes (Signed)
 Message sent to Dr Gordy Lauber about pt becoming extremely restless and agitated, pulling off wires and pulling out IV. Waiting for response

## 2023-10-01 NOTE — ED Notes (Signed)
 Dr Gordy Lauber at bedside with daughter and pt's wife

## 2023-10-01 NOTE — H&P (Signed)
 History and Physical      Brian [name] FMW:[id number] DOB: 1934-01-21 DOA: 09/30/2023; DOS: 10/01/2023  PCP: Millicent Redell POUR, MD  Patient coming from: home   I have personally briefly reviewed patient's old medical records in [facility]  Chief Complaint: episode of loss of consciousness  HPI: Brian [name] is a 89 y.o. male with medical history significant for dementia, severe aortic stenosis status post TAVR in June 2021, chronic diastolic heart failure, essential hypertension, type 2 diabetes mellitus, BPH, CKD 3B associated baseline creatinine 1.3-1.6, who is admitted to [facility] on 09/30/2023 with syncope after presenting from home to [facility] ED complaining of episode of loss of consciousness.   In the setting of patient's dementia, the following history is provided by the patient's wife and daughter, who are present at bedside, in addition to my discussions with the EDP and via chart review.  Wife conveys that the patient was sitting in a chair at home earlier in the evening eating a snack, when the patient's wife, who is with the patient throughout the following events, noted the patient to become unconscious also sitting in the chair.  She conveys that the patient was completely asymptomatic over the last few days including in the hours to minutes leading up to this episode of loss of consciousness.  She conveys that the patient has not been complaining of any recent chest pain, shortness of breath, palpitations, diaphoresis, dizziness, presyncope, and or any nausea, vomiting, diarrhea, or abdominal discomfort.   The patient's wife was able to gently move the patient from his seated position onto the floor, without the patient hitting his head and without any associated trauma.  The patient remained unconscious, per wife, for consciousness, without any ensuing additional loss of consciousness.  She reports that the episode of loss consciousness was not associate with any  tongue biting, nor any tonic-clonic activity nor any loss of bowel/bladder function.  Patient denies any acute focal weakness.  Wife confirms that the patient has no history of seizures.  His medical history is notable for severe aortic stenosis status post TAVR in June 2021, as well as a history of type 2 diabetes mellitus.  Of note, will the patient has had 2 episodes of loose watery, nonbloody stool in the ED this evening, patient's wife confirms that he has not experienced any recent diarrhea leading up to these episodes in the emergency department.  She confirms that the patient has not undergone any recent hospitalizations, and has not denied any recent antibiotics at home.    ED Course:  Vital signs in the ED were notable for the following: Afebrile; heart rates in the 60s to 80s; systolic blood pressures in the 120s to 140s; respiratory rate 16-24, and oxygen saturation 100% on room air.  Labs were notable for the following: CMP was notable for the following: Sodium 130, potassium 4.0, bicarbonate 24, creatinine 1.63, glucose 209, and liver enzymes were within normal limits.  High sensitive troponin I was initially 6, 3 value trending up slightly to 7.  CBC notable for evidence of 5700, hemoglobin 10.2 associated Neuraceq/recurrent properties and nonelevated RDW and relative to most recent prior hemoglobin data point of 8.8 on 03/24/2023.  Urinalysis showed no white blood cells, leukocyte esterase/nitrate negative, as well as no protein.  COVID, influenza, RSV PCR were all negative.  C. difficile antigen was positive, with reflex PCR pending at this time.  Per my interpretation, EKG in ED demonstrated the following: Interpretation of  EKG was limited by the presence of motion artifact.  However, within these confines, appears to show sinus rhythm with heart rate 84, normal intervals, no evidence of T wave or ST changes, including no evidence of ST elevation.  Imaging in the ED, per corresponding  formal radiology read, was notable for the following: Noncontrast CT head showed no evidence of acute intracranial process, Cleen evidence of intracranial injury evidence of acute infarct.  1 view chest x-ray showed no evidence of acute cardiopulmonary process, Cleen evidence of infection, edema, effusion, or pneumothorax.  While in the ED, the following were administered: 25 mg p.o. x 1 dose.  Subsequently, the patient was admitted for overnight observation for further evaluation management of presenting single syncopal episode.    Review of Systems: As per HPI otherwise 10 point review of systems negative.   Past Medical History:  Diagnosis Date   Anemia of chronic renal failure 05/08/2015   Aortic stenosis, severe 09/21/2015   Arthritis    Bladder stone    Chronic kidney disease (CKD), stage III (moderate) (HCC) 05/08/2015   Followed by Dr. Jaclynn    Congestive heart failure (HCC) 03/14/2019   Dementia (HCC)    patient will wander off if wife is not present   Diastolic dysfunction without heart failure 06/08/2017   Essential hypertension 05/08/2015   History of bladder stone    Hyperlipidemia    Hypertension    Nocturia    Renal disorder    S/P TAVR (transcatheter aortic valve replacement) 01/31/2020   s/p TAVR with an Edwards 26 mm S3U via the TF approach by Dr. Sampson and Dusty   Type 2 diabetes mellitus [facility])     Past Surgical History:  Procedure Laterality Date   BIOPSY  03/21/2023   Procedure: BIOPSY;  Surgeon: Rollin Dover, MD;  Location: [facility] ENDOSCOPY;  Service: Gastroenterology;;   COLONOSCOPY     CYSTOSCOPY/RETROGRADE/URETEROSCOPY/STONE EXTRACTION WITH BASKET N/A 03/22/2013   Procedure: CYSTOSCOPY BLADDER STONE STONE EXTRACTION;  Surgeon: Thomasine Oiler, MD;  Location: [facility];  Service: Urology;  Laterality: N/A;  CYSTOSCOPY     ESOPHAGOGASTRODUODENOSCOPY N/A 03/21/2023   Procedure: ESOPHAGOGASTRODUODENOSCOPY (EGD);  Surgeon: Rollin Dover, MD;   Location: [facility] ENDOSCOPY;  Service: Gastroenterology;  Laterality: N/A;   HEMOSTASIS CLIP PLACEMENT  03/21/2023   Procedure: HEMOSTASIS CLIP PLACEMENT;  Surgeon: Rollin Dover, MD;  Location: [facility] ENDOSCOPY;  Service: Gastroenterology;;   HOT HEMOSTASIS N/A 03/21/2023   Procedure: HOT HEMOSTASIS (ARGON PLASMA COAGULATION/BICAP);  Surgeon: Rollin Dover, MD;  Location[facility] ENDOSCOPY;  Service: Gastroenterology;  Laterality: N/A;   INTRAOPERATIVE TRANSTHORACIC ECHOCARDIOGRAM N/A 01/31/2020   Procedure: INTRAOPERATIVE TRANSTHORACIC ECHOCARDIOGRAM;  Surgeon: Verlin Lonni BIRCH, MD;  Location: [facility] OR;  Service: Open Heart Surgery;  Laterality: N/A;   RIGHT/LEFT HEART CATH AND CORONARY ANGIOGRAPHY N/A 01/12/2020   Procedure: RIGHT/LEFT HEART CATH AND CORONARY ANGIOGRAPHY;  Surgeon: Verlin Lonni BIRCH, MD;  Location: [facility] INVASIVE CV LAB;  Service: Cardiovascular;  Laterality: N/A;   TONSILLECTOMY     TRANSCATHETER AORTIC VALVE REPLACEMENT, TRANSFEMORAL N/A 01/31/2020   Procedure: TRANSCATHETER AORTIC VALVE REPLACEMENT, TRANSFEMORAL;  Surgeon: Verlin Lonni BIRCH, MD;  Location: [facility] OR;  Service: Open Heart Surgery;  Laterality: N/A;    Social History:  reports that he quit smoking about 30 years ago. His smoking use included cigarettes. He started smoking about 69 years ago. He has a 29.3 pack-year smoking history. He has never used smokeless tobacco. He reports that he does not drink alcohol  and does not  use drugs.   Allergies  Allergen Reactions   Amlodipine     Carvedilol  Other (See Comments)    bradycardia   Niaspan [Niacin Er (Antihyperlipidemic)]    Shellfish Allergy Other (See Comments)    Pt vomits   Statins Other (See Comments)    Myalgia   Sulfa Antibiotics    Zetia [Ezetimibe]    Januvia  [Sitagliptin ] Rash    Family History  Problem Relation Age of Onset   Hyperlipidemia Father    Congestive Heart Failure Other    Heart attack Other    Heart disease Other    Diabetes Brother      Family history reviewed and not pertinent    Prior to Admission medications   Medication Sig Start Date End Date Taking? Authorizing Provider  aspirin  EC 81 MG tablet Take 81 mg by mouth daily.    Yes [provider]  docusate sodium  (DOK) 100 MG capsule Take 1 capsule (100 mg total) by mouth 2 (two) times daily as needed. Patient taking differently: Take 100 mg by mouth 2 (two) times daily as needed for mild constipation. 10/27/17  Yes [name], Newell SQUIBB, MD  ferrous sulfate  325 (65 FE) MG tablet Take 1 tablet (325 mg total) by mouth every other day. Patient taking differently: Take 325 mg by mouth every Monday, Wednesday, and Friday. 03/24/23 10/10/23 Yes Perri DELENA Meliton Mickey., MD  finasteride  (PROSCAR ) 5 MG tablet Take 1 tablet (5 mg total) by mouth daily. 06/30/19  Yes Tapia, Leisa, PA-C  gabapentin  (NEURONTIN ) 100 MG capsule Take 100 mg by mouth at bedtime.   Yes [provider]  insulin  glargine (LANTUS ) 100 UNIT/ML injection Inject 5 Units into the skin daily.   Yes [provider]  Omega-3 Fatty Acids (FISH OIL ) 1000 MG CAPS Take 1 capsule (1,000 mg total) by mouth daily. 12/04/17  Yes Raford Riggs, MD  pantoprazole  (PROTONIX ) 40 MG tablet Take 1 tablet (40 mg total) by mouth 2 (two) times daily. After completing 2 months, take 40 mg daily. 03/24/23 09/30/24 Yes Perri DELENA Meliton Mickey., MD  QUEtiapine  (SEROQUEL ) 25 MG tablet Take 25 mg by mouth at bedtime as needed (for agitation). 09/22/23  Yes [provider]  tamsulosin  (FLOMAX ) 0.4 MG CAPS capsule TAKE 1 CAPSULE BY MOUTH EVERY DAY Patient taking differently: Take 0.4 mg by mouth daily. 11/03/19  Yes Tapia, Leisa, PA-C  ACCU-CHEK GUIDE test strip USE UP TO 4 TIMES A DAY AS DIRECTED 03/06/19   Rolly Almarie BRAVO, NP  Accu-Chek Softclix Lancets lancets  09/01/19   [provider]  Insulin  Pen Needle (B-D UF III MINI PEN NEEDLES) 31G X 5 MM MISC USE AS DIRECTED WITH LEVEMIR  INSULIN  PENS AT BEDTIME,  once a day 10/20/18   [name], Newell SQUIBB, MD  predniSONE  (DELTASONE ) 5 MG tablet Take 1 tablet by mouth daily. Patient not taking: Reported on 10/01/2023 11/28/20   [provider]     Objective    Physical Exam: Vitals:   10/01/23 0145 10/01/23 0215 10/01/23 0245 10/01/23 0300  BP: (!) 143/63 (!) 143/67 (!) 141/61 130/89  Pulse: 80 71 65 68  Resp: 15 18 19 17   Temp:      TempSrc:      SpO2: 100% 100% 100% 100%  Weight:      Height:        General: appears to be stated age; alert, confused Skin: warm, dry, no rash Head:  AT/[place] Mouth:  Oral mucosa membranes appear dry, normal dentition  Neck: supple; trachea midline Heart:  RRR; did not appreciate any M/R/G Lungs: CTAB, did not appreciate any wheezes, rales, or rhonchi Abdomen: + BS; soft, ND, NT Vascular: 2+ pedal pulses b/l; 2+ radial pulses b/l Extremities: no peripheral edema, no muscle wasting Neuro:  In the setting of the patient's current mental status and associated inability to follow instructions, unable to perform full neurologic exam at this time.  As such, assessment of strength, sensation, and cranial nerves is limited at this time. Patient noted to spontaneously move all 4 extremities. No tremors.      Labs on Admission: I have personally reviewed following labs and imaging studies  CBC: Recent Labs  Lab 09/30/23 2329  WBC 5.7  NEUTROABS 2.3  HGB 10.2*  HCT 31.6*  MCV 88.8  PLT 217   Basic Metabolic Panel: Recent Labs  Lab 09/30/23 2329  NA 138  K 4.0  CL 108  CO2 24  GLUCOSE 209*  BUN 21  CREATININE 1.63*  CALCIUM  9.6   GFR: Estimated Creatinine Clearance: 27.5 mL/min (A) (by C-G formula based on SCr of 1.63 mg/dL (H)). Liver Function Tests: Recent Labs  Lab 09/30/23 2329  AST 20  ALT 11  ALKPHOS 58  BILITOT 0.5  PROT 7.2  ALBUMIN  3.7   No results for input(s): LIPASE, AMYLASE in the last 168 hours. No results for input(s): AMMONIA in the last 168 hours. Coagulation  Profile: No results for input(s): INR, PROTIME in the last 168 hours. Cardiac Enzymes: No results for input(s): CKTOTAL, CKMB, CKMBINDEX, TROPONINI in the last 168 hours. BNP (last 3 results) No results for input(s): PROBNP in the last 8760 hours. HbA1C: No results for input(s): HGBA1C in the last 72 hours. CBG: No results for input(s): GLUCAP in the last 168 hours. Lipid Profile: No results for input(s): CHOL, HDL, LDLCALC, TRIG, CHOLHDL, LDLDIRECT in the last 72 hours. Thyroid Function Tests: No results for input(s): TSH, T4TOTAL, FREET4, T3FREE, THYROIDAB in the last 72 hours. Anemia Panel: No results for input(s): VITAMINB12, FOLATE, FERRITIN, TIBC, IRON, RETICCTPCT in the last 72 hours. Urine analysis:    Component Value Date/Time   COLORURINE YELLOW 10/01/2023 0141   APPEARANCEUR CLEAR 10/01/2023 0141   LABSPEC 1.014 10/01/2023 0141   PHURINE 5.0 10/01/2023 0141   GLUCOSEU 50 (A) 10/01/2023 0141   HGBUR NEGATIVE 10/01/2023 0141   BILIRUBINUR NEGATIVE 10/01/2023 0141   BILIRUBINUR negative 05/03/2018 0817   KETONESUR NEGATIVE 10/01/2023 0141   PROTEINUR 30 (A) 10/01/2023 0141   UROBILINOGEN negative (A) 05/03/2018 0817   UROBILINOGEN 0.2 07/04/2012 1433   NITRITE NEGATIVE 10/01/2023 0141   LEUKOCYTESUR NEGATIVE 10/01/2023 0141    Radiological Exams on Admission: CT Head Wo Contrast Result Date: 10/01/2023 CLINICAL DATA:  Mental status change of unknown cause. Sudden onset of unresponsiveness. Decreased heart rate. EXAM: CT HEAD WITHOUT CONTRAST TECHNIQUE: Contiguous axial images were obtained from the base of the skull through the vertex without intravenous contrast. RADIATION DOSE REDUCTION: This exam was performed according to the departmental dose-optimization program which includes automated exposure control, adjustment of the mA and/or kV according to patient size and/or use of iterative reconstruction technique.  COMPARISON:  None Available. FINDINGS: Brain: Diffuse cerebral atrophy. Ventricular dilatation consistent with central atrophy. Low-attenuation changes in the deep white matter consistent with small vessel ischemia. No abnormal extra-axial fluid collections. No mass effect or midline shift. Gray-white matter junctions are distinct. Basal cisterns are not effaced. No acute intracranial hemorrhage. Cavum septum pellucidum. Vascular: No hyperdense vessel  or unexpected calcification. Skull: Normal. Negative for fracture or focal lesion. Sinuses/Orbits: No acute finding. Other: None. IMPRESSION: No acute intracranial abnormalities. Chronic atrophy and small vessel ischemic changes. Electronically Signed   By: Elsie Gravely M.D.   On: 10/01/2023 00:12   DG Chest Portable 1 View Result Date: 09/30/2023 CLINICAL DATA:  Bradycardia EXAM: PORTABLE CHEST 1 VIEW COMPARISON:  01/31/2020 FINDINGS: Lungs are clear. No pneumothorax or pleural effusion. Cardiac size within normal limits. Transcatheter aortic valve replacement has been performed. Pulmonary vascularity is normal. No acute bone abnormality IMPRESSION: 1. No active disease. Electronically Signed   By: Dorethia Molt M.D.   On: 09/30/2023 23:56      Assessment/Plan   Principal Problem:   Syncope Active Problems:   CKD stage 3b, GFR 30-44 ml/min (HCC)   BPH (benign prostatic hyperplasia)   Anemia of chronic disease   DM2 (diabetes mellitus, type 2) (HCC)   Loose stools   Chronic diastolic CHF (congestive heart failure) (HCC)    #) Syncope: Single episode of syncope at home earlier this evening in which the patient lost consciousness without any significant positional changes, while eating a snack and stated in a chair.  Difficult to assess for any prodrome given the patient's history of advanced dementia.  Given that the patient was eating at the time, there is a possibility of a relative prandial shift of blood volume to enteric vasculature  resulting in relative decline preload/afterload with associated predisposition towards either an episode of orthostatic hypotension versus vasovagal episode, compounded by the vasodilatory effects of his outpatient tamsulosin .  At this time, ACS appears less likely, in the absence of any overt chest pain, while high-sensitivity troponin I x 2 values are flat and nonelevated, while presenting EKG shows no evidence of acute ischemic changes.  Additionally, chest x-ray shows no evidence of acute process, Cleen evidence of pneumothorax.  Acute ischemic CVA appears less likely in the absence of any associated acute focal neurologic deficits, while noting that CT head showed no evidence of acute intracranial process.  Additionally, history appears less suggestive of an episode of seizure.  Overall, will start vital signs, provide some gentle IV fluids, and pursue additional diagnostic evaluation as outlined below.  Plan: Check orthostatic vital signs x 1 set.  Monitor strict I's and O's and daily weights.  Lactated Ringer 's at 53 cc/h.  Echocardiogram ordered for the morning.  Repeat troponin level in the morning.  Add on serum magnesium  level.  Repeat CMP, CBC in the morning.                  #) Loose stool: Staff reports 2 episodes of loose, watery stool was noted in the ED this evening, with wife, who is the patient's primary caregiver, conveying that the patient has not exhibited any evidence of loose stool last several days at home.  Ensuing workup as ordered by EDP was notable for C. difficile antigen positive findings, with reflex C. difficile PCR currently pending.  No overt significant risk factors identified at this time aside from the patient's use of Protonix  as an outpatient, increasing his risk for translocation of gut bacteria.  Wife confirms no use of recent antibiotics.  Patient denies any recent abdominal discomfort, abdomen is soft, nondistended nontender, without any evidence  of acute peritoneal findings.  He has received a single dose oral vancomycin , pending the result of the reflex C. difficile PCR.  Plan: Follow for result of reflex C. difficile PCR, as above.  Repeat CBC in the morning.  Add on serum magnesium  level.  CMP in the morning.  Gentle IV fluids, as above.                      #) Chronic diastolic heart failure: documented history of such, with most recent echocardiogram performed in June 2022, which was notable for LVEF 70 to 75%, no evidence of focal Measurin  values, grade 1 diastolic dysfunction, normal right ventricular systolic function, and mild mitral regurgitation status post TAVR. No clinical evidence or radiographic to suggest acutely decompensated heart failure at this time. home diuretic regimen reportedly consists of the following: None.    Plan: monitor strict I's & O's and daily weights. Repeat CMP in AM. Check serum mag level.  Follow-up for result of echocardiogram, which has been ordered for the morning as a component of evaluation of his presenting syncopal episode.                      #) Type 2 Diabetes Mellitus: documented history of such. Home insulin  regimen: Lantus  5 units SQ daily. Home oral hypoglycemic agents: None. presenting blood sugar: 209. Most recent A1c noted to be 8.0% when checked in July 2024.  This appears complicated by history of diabetic peripheral polyneuropathy, for which he is on daily gabapentin .  Plan: accuchecks QAC and HS with low dose SSI.  Add on hemoglobin A1c level.  Continue outpatient gabapentin .                 #) Benign Prostatic Hyperplasia:  documented h/o such; on tamsulosin  as well as Proscar  as outpatient.  The tamsulosin  component of this is normal given the patient's presenting episode of syncope, with the associated vasodilatory effects of tamsulosin  posing increased risk orthostatic hypotension versus vasovagal pathophysiology  Plan:  monitor strict I's & O's and daily weights. Repeat CMP in AM.  continue home tamsulosin  and outpatient Proscar .                    #) CKD Stage 3B: Documented history of such, with baseline creatinine 1.3-1.6, with presenting creatinine consistent with this baseline.    Plan: Monitor strict I's and O's and daily weights.  Attempt to avoid nephrotoxic agents.  CMP/magnesium  level in the AM.                 #) Anemia of chronic disease: Documented history of such, a/w with baseline hgb range 7-10, with presenting hgb consistent with this range, in the absence of any overt evidence of active bleed.     Plan: Repeat CBC in the morning.           DVT prophylaxis: SCD's   Code Status: Full code Family Communication: I discussed patient's case with his wife as well as his daughter, both of whom are present at bedside Disposition Plan: Per Rounding Team Consults called: none;  Admission status: Inpatient     I SPENT GREATER THAN 75  MINUTES IN CLINICAL CARE TIME/MEDICAL DECISION-MAKING IN COMPLETING THIS ADMISSION.      Nehemias Sauceda B Curties Conigliaro DO [facility]  From 7PM - 7AM   10/01/2023, 3:49 AM

## 2023-10-01 NOTE — Care Management (Signed)
 After reviewing chart careful patient is not a Medicare Beneficiary noted in the system. So not eligible for MOON letter. Attempted to cancel after preparing table  to met with patient and family in insolation room.

## 2023-10-01 NOTE — ED Notes (Signed)
 [facility] called for transport.

## 2023-10-01 NOTE — ED Notes (Addendum)
 Cards at bedside to evaluate pt. Cards ordered IV ativan  for pt with daughter's approval. Pt's daughter extremely upset with delay of response from provider, and very few staff. Daughter called the hospital from cell phone (in the room) and asked for someone to talk to. Stated the nurse is doing the work of 10 patients by herself and we just need some help in the room. The nurse is in here, and is doing what she can. Daughter wanted to speak with charge nurse. Daughter also upset that she hadn't seen a doctor all morning.

## 2023-10-01 NOTE — ED Notes (Signed)
 Paged out hospitalist about pt needing something for anxiety. Pt actively trying to leave and ripped everything off

## 2023-10-01 NOTE — Discharge Summary (Signed)
 Physician Discharge Summary   Brian [name]  male DOB: 1934/06/22  FMW:[id number]  PCP: Millicent Redell POUR, MD  Admit date: 09/30/2023 Discharge date: 10/01/2023  Admitted From: home Disposition:  home Daughter and wife updated at bedside prior to discharge. CODE STATUS: Full code   Hospital Course:  For full details, please see H&P, progress notes, consult notes and ancillary notes.  Briefly,  Brian [name] is a 89 y.o. male with medical history significant for dementia, severe aortic stenosis status post TAVR in June 2021, chronic diastolic heart failure, essential hypertension, type 2 diabetes mellitus, BPH, CKD 3B associated baseline creatinine 1.3-1.6, who was admitted to [facility] on 09/30/2023 due to an episode of loss of consciousness.   #) Syncope:  Single episode of syncope at home in which the patient lost consciousness without any significant positional changes, while eating a snack and seated in a chair.  Per EMS, pt was found bradycardic on arrival.   --while in the ED, pt's heart rate was noted again to be in 30's-40' though asymptomatic and BP actually elevated.  Given that pt did have a syncope event, cardiology was consulted.  Cardio had low suspicion for cardiac syncope and given pt would unlikely tolerate outpatient monitor, no further workup needed from a cardiac standpoint.     #) Loose stool 2/2  C diff infection Staff reported 2 episodes of loose, watery stool noted in the ED.  C diff antigen pos, toxin neg, reflex PCR pos.  Pt was therefore started on oral vanc for 10 days, to be completed at home.   Hospital delirium Baseline dementia --pt was agitated, pulling out lines, trying to get out of bed in the ED.  Discussed with family at bedside, and all agreed that it's best to discharge pt home as soon as possible.   #) Chronic diastolic heart failure:  --stable     #) Type 2 Diabetes Mellitus:  --discharged back on home regimen as below    #)  Benign Prostatic Hyperplasia:   --cont home Flomax  and finasteride    #) CKD Stage 3B:  --at baseline    #) Anemia of chronic disease:    HTN --per wife, pt takes Lisinopril  40 mg daily at home.   Discharge Diagnoses:  Principal Problem:   Syncope Active Problems:   CKD stage 3b, GFR 30-44 ml/min (HCC)   BPH (benign prostatic hyperplasia)   Anemia of chronic disease   DM2 (diabetes mellitus, type 2) (HCC)   Loose stools   Chronic diastolic CHF (congestive heart failure) (HCC)     Discharge Instructions:  Allergies as of 10/01/2023       Reactions   Amlodipine     Carvedilol  Other (See Comments)   bradycardia   Niaspan [niacin Er (antihyperlipidemic)]    Shellfish Allergy Other (See Comments)   Pt vomits   Statins Other (See Comments)   Myalgia   Sulfa Antibiotics    Zetia [ezetimibe]    Januvia  [sitagliptin ] Rash        Medication List     STOP taking these medications    predniSONE  5 MG tablet Commonly known as: DELTASONE        TAKE these medications    Accu-Chek Guide test strip Generic drug: glucose blood USE UP TO 4 TIMES A DAY AS DIRECTED   Accu-Chek Softclix Lancets lancets   aspirin  EC 81 MG tablet Take 81 mg by mouth daily.   docusate sodium  100 MG capsule Commonly known as:  DOK Take 1 capsule (100 mg total) by mouth 2 (two) times daily as needed. What changed: reasons to take this   FeroSul 325 (65 Fe) MG tablet Generic drug: ferrous sulfate  Take 1 tablet (325 mg total) by mouth every other day. What changed: when to take this   finasteride  5 MG tablet Commonly known as: PROSCAR  Take 1 tablet (5 mg total) by mouth daily.   Fish Oil  1000 MG Caps Take 1 capsule (1,000 mg total) by mouth daily.   gabapentin  100 MG capsule Commonly known as: NEURONTIN  Take 100 mg by mouth at bedtime.   insulin  glargine 100 UNIT/ML injection Commonly known as: LANTUS  Inject 5 Units into the skin daily.   Insulin  Pen Needle 31G X 5 MM  Misc Commonly known as: B-D UF III MINI PEN NEEDLES USE AS DIRECTED WITH LEVEMIR  INSULIN  PENS AT BEDTIME, once a day   lisinopril  40 MG tablet Commonly known as: ZESTRIL  Take 1 tablet (40 mg total) by mouth daily. Home med.   pantoprazole  40 MG tablet Commonly known as: PROTONIX  Take 1 tablet (40 mg total) by mouth 2 (two) times daily. After completing 2 months, take 40 mg daily.   QUEtiapine  25 MG tablet Commonly known as: SEROQUEL  Take 25 mg by mouth at bedtime as needed (for agitation).   tamsulosin  0.4 MG Caps capsule Commonly known as: FLOMAX  TAKE 1 CAPSULE BY MOUTH EVERY DAY   vancomycin  125 MG capsule Commonly known as: VANCOCIN  Take 1 capsule (125 mg total) by mouth 4 (four) times daily -  before meals and at bedtime for 10 days.         Follow-up Information     Millicent Redell POUR, MD Follow up in 1 week(s).   Specialty: Hospitalist Contact information: [address] [place] [address] [phone]                 Allergies  Allergen Reactions   Amlodipine     Carvedilol  Other (See Comments)    bradycardia   Niaspan [Niacin Er (Antihyperlipidemic)]    Shellfish Allergy Other (See Comments)    Pt vomits   Statins Other (See Comments)    Myalgia   Sulfa Antibiotics    Zetia [Ezetimibe]    Januvia  [Sitagliptin ] Rash     The results of significant diagnostics from this hospitalization (including imaging, microbiology, ancillary and laboratory) are listed below for reference.   Consultations:   Procedures/Studies: CT Head Wo Contrast Result Date: 10/01/2023 CLINICAL DATA:  Mental status change of unknown cause. Sudden onset of unresponsiveness. Decreased heart rate. EXAM: CT HEAD WITHOUT CONTRAST TECHNIQUE: Contiguous axial images were obtained from the base of the skull through the vertex without intravenous contrast. RADIATION DOSE REDUCTION: This exam was performed according to the departmental dose-optimization program which includes  automated exposure control, adjustment of the mA and/or kV according to patient size and/or use of iterative reconstruction technique. COMPARISON:  None Available. FINDINGS: Brain: Diffuse cerebral atrophy. Ventricular dilatation consistent with central atrophy. Low-attenuation changes in the deep white matter consistent with small vessel ischemia. No abnormal extra-axial fluid collections. No mass effect or midline shift. Gray-white matter junctions are distinct. Basal cisterns are not effaced. No acute intracranial hemorrhage. Cavum septum pellucidum. Vascular: No hyperdense vessel or unexpected calcification. Skull: Normal. Negative for fracture or focal lesion. Sinuses/Orbits: No acute finding. Other: None. IMPRESSION: No acute intracranial abnormalities. Chronic atrophy and small vessel ischemic changes. Electronically Signed   By: Elsie Gravely M.D.   On: 10/01/2023 00:12  DG Chest Portable 1 View Result Date: 09/30/2023 CLINICAL DATA:  Bradycardia EXAM: PORTABLE CHEST 1 VIEW COMPARISON:  01/31/2020 FINDINGS: Lungs are clear. No pneumothorax or pleural effusion. Cardiac size within normal limits. Transcatheter aortic valve replacement has been performed. Pulmonary vascularity is normal. No acute bone abnormality IMPRESSION: 1. No active disease. Electronically Signed   By: Dorethia Molt M.D.   On: 09/30/2023 23:56      Labs: BNP (last 3 results) Recent Labs    10/01/23 0535  BNP 113.8*   Basic Metabolic Panel: Recent Labs  Lab 09/30/23 2329 10/01/23 0535  NA 138 139  K 4.0 4.5  CL 108 108  CO2 24 23  GLUCOSE 209* 224*  BUN 21 24*  CREATININE 1.63* 1.55*  CALCIUM  9.6 8.8*  MG  --  2.0   Liver Function Tests: Recent Labs  Lab 09/30/23 2329 10/01/23 0535  AST 20 19  ALT 11 11  ALKPHOS 58 41  BILITOT 0.5 0.4  PROT 7.2 5.9*  ALBUMIN  3.7 3.0*   No results for input(s): LIPASE, AMYLASE in the last 168 hours. No results for input(s): AMMONIA in the last 168  hours. CBC: Recent Labs  Lab 09/30/23 2329 10/01/23 0535  WBC 5.7 6.7  NEUTROABS 2.3 4.9  HGB 10.2* 8.7*  HCT 31.6* 26.8*  MCV 88.8 88.4  PLT 217 184   Cardiac Enzymes: No results for input(s): CKTOTAL, CKMB, CKMBINDEX, TROPONINI in the last 168 hours. BNP: Invalid input(s): POCBNP CBG: Recent Labs  Lab 10/01/23 0621 10/01/23 0751 10/01/23 1146  GLUCAP 180* 179* 161*   D-Dimer No results for input(s): DDIMER in the last 72 hours. Hgb A1c No results for input(s): HGBA1C in the last 72 hours. Lipid Profile No results for input(s): CHOL, HDL, LDLCALC, TRIG, CHOLHDL, LDLDIRECT in the last 72 hours. Thyroid function studies No results for input(s): TSH, T4TOTAL, T3FREE, THYROIDAB in the last 72 hours.  Invalid input(s): FREET3 Anemia work up No results for input(s): VITAMINB12, FOLATE, FERRITIN, TIBC, IRON, RETICCTPCT in the last 72 hours. Urinalysis    Component Value Date/Time   COLORURINE YELLOW 10/01/2023 0141   APPEARANCEUR CLEAR 10/01/2023 0141   LABSPEC 1.014 10/01/2023 0141   PHURINE 5.0 10/01/2023 0141   GLUCOSEU 50 (A) 10/01/2023 0141   HGBUR NEGATIVE 10/01/2023 0141   BILIRUBINUR NEGATIVE 10/01/2023 0141   BILIRUBINUR negative 05/03/2018 0817   KETONESUR NEGATIVE 10/01/2023 0141   PROTEINUR 30 (A) 10/01/2023 0141   UROBILINOGEN negative (A) 05/03/2018 0817   UROBILINOGEN 0.2 07/04/2012 1433   NITRITE NEGATIVE 10/01/2023 0141   LEUKOCYTESUR NEGATIVE 10/01/2023 0141   Sepsis Labs Recent Labs  Lab 09/30/23 2329 10/01/23 0535  WBC 5.7 6.7   Microbiology Recent Results (from the past 240 hours)  C Difficile Quick Screen w PCR reflex     Status: Abnormal   Collection Time: 10/01/23  1:18 AM   Specimen: Urine, Catheterized; Stool  Result Value Ref Range Status   C Diff antigen POSITIVE (A) NEGATIVE Final   C Diff toxin NEGATIVE NEGATIVE Final   C Diff interpretation Results are indeterminate. See PCR  results.  Final    Comment: Performed at [facility], [address] [place], [place] [address]  Resp panel by RT-PCR (RSV, Flu A&B, Covid) Urine, Catheterized     Status: None   Collection Time: 10/01/23  1:18 AM   Specimen: Urine, Catheterized; Nasal Swab  Result Value Ref Range Status   SARS Coronavirus 2 by RT PCR NEGATIVE NEGATIVE  Final   Influenza A by PCR NEGATIVE NEGATIVE Final   Influenza B by PCR NEGATIVE NEGATIVE Final    Comment: (NOTE) The Xpert Xpress SARS-CoV-2/FLU/RSV plus assay is intended as an aid in the diagnosis of influenza from Nasopharyngeal swab specimens and should not be used as a sole basis for treatment. Nasal washings and aspirates are unacceptable for Xpert Xpress SARS-CoV-2/FLU/RSV testing.  Fact Sheet for Patients: bloggercourse.com  Fact Sheet for Healthcare Providers: seriousbroker.it  This test is not yet approved or cleared by the [place]  FDA and has been authorized for detection and/or diagnosis of SARS-CoV-2 by FDA under an Emergency Use Authorization (EUA). This EUA will remain in effect (meaning this test can be used) for the duration of the COVID-19 declaration under Section 564(b)(1) of the Act, 21 U.S.C. section 360bbb-3(b)(1), unless the authorization is terminated or revoked.     Resp Syncytial Virus by PCR NEGATIVE NEGATIVE Final    Comment: (NOTE) Fact Sheet for Patients: bloggercourse.com  Fact Sheet for Healthcare Providers: seriousbroker.it  This test is not yet approved or cleared by the [place]  FDA and has been authorized for detection and/or diagnosis of SARS-CoV-2 by FDA under an Emergency Use Authorization (EUA). This EUA will remain in effect (meaning this test can be used) for the duration of the COVID-19 declaration under Section 564(b)(1) of the Act, 21 U.S.C. section 360bbb-3(b)(1), unless the  authorization is terminated or revoked.  Performed at [facility], [address] [place], [place] [address]   C. Diff by PCR, Reflexed     Status: Abnormal   Collection Time: 10/01/23  1:18 AM  Result Value Ref Range Status   Toxigenic C. Difficile by PCR POSITIVE (A) NEGATIVE Final    Comment: Positive for toxigenic C. difficile with little to no toxin production. Only treat if clinical presentation suggests symptomatic illness. Performed at [facility], [address], [place], [place] [address]      Total time spend on discharging this patient, including the last patient exam, discussing the hospital stay, instructions for ongoing care as it relates to all pertinent caregivers, as well as preparing the medical discharge records, prescriptions, and/or referrals as applicable, is 60 minutes.    Ellouise Haber, MD  [facility] 10/01/2023, 2:01 PM

## 2023-10-01 NOTE — ED Notes (Signed)
 Patient transported to CT

## 2023-10-01 NOTE — Consult Note (Addendum)
 Cardiology Consultation   Patient ID: Brian [name] MRN: [id number]; DOB: 06/01/1934  Admit date: 09/30/2023 Date of Consult: 10/01/2023  PCP:  Millicent Redell POUR, MD   [facility] Providers Cardiologist:  Dr. Annabella Scarce    Patient Profile:   Brian [name] is a 89 y.o. male with a hx of dementia, severe aortic stenosis s/p TAVR in June 2021, chronic diastolic heart failure, essential hypertension, DM2, BPH, CKD 3B associated with baseline creatine 1.3-1.6 who is being seen 10/01/2023 for the evaluation of syncope and bradycardia at the request of Dr. Eva Pore.   History of Present Illness:   [name] is 89 y/o male with above medical history. Patient last seen in cardiology clinic on 09/19/2022 for a follow up. At that time, he reported having episodes of dizziness. Spouse was concerned these episodes may have been related to prostate medications. They were advised to practice caution with when changing positions and hydration was encouraged. His dementia was worsening at this time.   In 06/2015, he was admitted to the hospital for liver abscess and sepsis. During his admission, he had demand ischemia with a peak troponin of 1.0. EKG was negative for ischemia and his echo revealed normal systolic function, Grade 1 DD and mild aortic stenosis. After his admission, he underwent exercise Cardiolite  on 09/2015 that was negative for ischemia. He was started on pravastatin . At follow up appointment, his PCP noted bradycardia to 45 bpm. As a result, carvedilol  was held and he remained symptomatic. At next PCP appointment, he was noted to be in ventricular bigeminy. He was referred to EP given his history of bradycardia on beta blockers. A 48 hour holter from 08/2018 showed occasional PAC/PVCs with no atrial fibrillation.   In 09/2019, echocardiogram showed severe aortic valve stenosis. He was referred to structural heart for evaluation of TAVR. LCA prior to TAVR revealed  moderate disease in LAD and marginal branches with mild disease in RCA and circumflex. CAD managed medically. He had a TAVR implanted on 01/2020.   Follow up echocardiogram in 01/2021 showed EF 70-75%, no regional wall motion abnormalities, normal RV function, mild PVL with normally functioning TAVR valve.   Mr. Lukacs has had documented intolerance to statins and allergy to Zetia. He expierenced severe pain related to joint and muscle discomfort, while on these medications.   Patient was admitted to hospital 7/25-30/2024 for syncope evaluation. At this time, he was found to have a GI bleed and he required 5 units of pRBCs. EGD revealed erosive gastropathy and non bleeding duodenal ulcer with visible vessel, treated with bipolar cautery and clip placed. He was discharged with PPI medications and iron supplement. Per GI, he was cleared to continue aspirin .   He presented to ED on 09/30/2023 with syncope. Per ED note, patient is poor historian due to dementia history, therefore wife and daughter facillate discussions. Wife noted that patient had been sitting in a chair that evening eating a snack when she saw him becoming unconscious. She reports that patient has been asymptomatic leading up to this event. Wife denied any seizure associated symptoms, loss of bowel/bladder function or any acute focal weakness. Wife reported that patient has no history of seizures. Per ED note, patient had 2 episodes of loose watery, nonbloody stool in ED. Wife denied any recent diarrhea prior to ED visit. Initial VS: BP 141/74, HR 67, O2 100%. EKG: interpretation is limited by baseline artifact but appears to show NSR with HR at 84.  CR:1.55,  GFR 43, BNP: 113.8, hsTN 7>8. CT Head revealed No acute intracranial abnormalities, Chronic atrophy and small vessel ischemic changes. CXR revealed  no active disease. While in the ED, his heart rate has dropped to 40's but is currently stable at 83. He tested positive C diff in ED. Per chart  review, it does not appear he is on any BP medications.   During my interview, patient was a poor historian due to dementia history. His daughter is at bedside, but she was not present during episode. She states that her stepmother reported that he was sitting in a chair yesterday eating a snack when he lost consciousness all of a sudden.She states no evidence of any prodromal symptoms, seizure or stroke activity.  The daughter states that the patient is taking blood pressure medications but is not sure of the exact medications.  The patient's wife was not present at this time to provide any clarifying information on the patient.  When asked about further studies/procedures, daughter reported trying to avoid them if possible. the daughter called the stepmother while I was in the room to confirm her arrival.  The daughter stated that the stepmother was in the building and will be arriving soon.  Upon completion of my interview, I waited outside the room for patient's wife arrival but she never arrived.   Past Medical History:  Diagnosis Date   Anemia of chronic renal failure 05/08/2015   Aortic stenosis, severe 09/21/2015   Arthritis    Bladder stone    Chronic kidney disease (CKD), stage III (moderate) (HCC) 05/08/2015   Followed by Dr. Jaclynn    Congestive heart failure (HCC) 03/14/2019   Dementia (HCC)    patient will wander off if wife is not present   Diastolic dysfunction without heart failure 06/08/2017   Essential hypertension 05/08/2015   History of bladder stone    Hyperlipidemia    Hypertension    Nocturia    Renal disorder    S/P TAVR (transcatheter aortic valve replacement) 01/31/2020   s/p TAVR with an Edwards 26 mm S3U via the TF approach by Dr. Sampson and Dusty   Type 2 diabetes mellitus [facility])     Past Surgical History:  Procedure Laterality Date   BIOPSY  03/21/2023   Procedure: BIOPSY;  Surgeon: Rollin Dover, MD;  Location: [facility] ENDOSCOPY;  Service: Gastroenterology;;    COLONOSCOPY     CYSTOSCOPY/RETROGRADE/URETEROSCOPY/STONE EXTRACTION WITH BASKET N/A 03/22/2013   Procedure: CYSTOSCOPY BLADDER STONE STONE EXTRACTION;  Surgeon: Thomasine Oiler, MD;  Location: [facility];  Service: Urology;  Laterality: N/A;  CYSTOSCOPY     ESOPHAGOGASTRODUODENOSCOPY N/A 03/21/2023   Procedure: ESOPHAGOGASTRODUODENOSCOPY (EGD);  Surgeon: Rollin Dover, MD;  Location: [facility] ENDOSCOPY;  Service: Gastroenterology;  Laterality: N/A;   HEMOSTASIS CLIP PLACEMENT  03/21/2023   Procedure: HEMOSTASIS CLIP PLACEMENT;  Surgeon: Rollin Dover, MD;  Location: [facility] ENDOSCOPY;  Service: Gastroenterology;;   HOT HEMOSTASIS N/A 03/21/2023   Procedure: HOT HEMOSTASIS (ARGON PLASMA COAGULATION/BICAP);  Surgeon: Rollin Dover, MD;  Location: [facility] ENDOSCOPY;  Service: Gastroenterology;  Laterality: N/A;   INTRAOPERATIVE TRANSTHORACIC ECHOCARDIOGRAM N/A 01/31/2020   Procedure: INTRAOPERATIVE TRANSTHORACIC ECHOCARDIOGRAM;  Surgeon: Verlin Lonni BIRCH, MD;  Location: [facility] OR;  Service: Open Heart Surgery;  Laterality: N/A;   RIGHT/LEFT HEART CATH AND CORONARY ANGIOGRAPHY N/A 01/12/2020   Procedure: RIGHT/LEFT HEART CATH AND CORONARY ANGIOGRAPHY;  Surgeon: Verlin Lonni BIRCH, MD;  Location: [facility] INVASIVE CV LAB;  Service: Cardiovascular;  Laterality: N/A;   TONSILLECTOMY  TRANSCATHETER AORTIC VALVE REPLACEMENT, TRANSFEMORAL N/A 01/31/2020   Procedure: TRANSCATHETER AORTIC VALVE REPLACEMENT, TRANSFEMORAL;  Surgeon: Verlin Lonni BIRCH, MD;  Location: [facility] OR;  Service: Open Heart Surgery;  Laterality: N/A;     Home Medications:  Prior to Admission medications   Medication Sig Start Date End Date Taking? Authorizing Provider  aspirin  EC 81 MG tablet Take 81 mg by mouth daily.    Yes [provider]  docusate sodium  (DOK) 100 MG capsule Take 1 capsule (100 mg total) by mouth 2 (two) times daily as needed. Patient taking differently: Take 100 mg by mouth 2 (two) times daily as needed  for mild constipation. 10/27/17  Yes [name], Newell SQUIBB, MD  ferrous sulfate  325 (65 FE) MG tablet Take 1 tablet (325 mg total) by mouth every other day. Patient taking differently: Take 325 mg by mouth every Monday, Wednesday, and Friday. 03/24/23 10/10/23 Yes Perri DELENA Meliton Mickey., MD  finasteride  (PROSCAR ) 5 MG tablet Take 1 tablet (5 mg total) by mouth daily. 06/30/19  Yes Tapia, Leisa, PA-C  gabapentin  (NEURONTIN ) 100 MG capsule Take 100 mg by mouth at bedtime.   Yes [provider]  insulin  glargine (LANTUS ) 100 UNIT/ML injection Inject 5 Units into the skin daily.   Yes [provider]  Omega-3 Fatty Acids (FISH OIL ) 1000 MG CAPS Take 1 capsule (1,000 mg total) by mouth daily. 12/04/17  Yes Raford Riggs, MD  pantoprazole  (PROTONIX ) 40 MG tablet Take 1 tablet (40 mg total) by mouth 2 (two) times daily. After completing 2 months, take 40 mg daily. 03/24/23 09/30/24 Yes Perri DELENA Meliton Mickey., MD  QUEtiapine  (SEROQUEL ) 25 MG tablet Take 25 mg by mouth at bedtime as needed (for agitation). 09/22/23  Yes [provider]  tamsulosin  (FLOMAX ) 0.4 MG CAPS capsule TAKE 1 CAPSULE BY MOUTH EVERY DAY Patient taking differently: Take 0.4 mg by mouth daily. 11/03/19  Yes Tapia, Leisa, PA-C  ACCU-CHEK GUIDE test strip USE UP TO 4 TIMES A DAY AS DIRECTED 03/06/19   Rolly Almarie BRAVO, NP  Accu-Chek Softclix Lancets lancets  09/01/19   [provider]  Insulin  Pen Needle (B-D UF III MINI PEN NEEDLES) 31G X 5 MM MISC USE AS DIRECTED WITH LEVEMIR  INSULIN  PENS AT BEDTIME, once a day 10/20/18   Hinton Newell SQUIBB, MD  lisinopril  (ZESTRIL ) 40 MG tablet Take 1 tablet (40 mg total) by mouth daily. Home med. 10/01/23   Awanda City, MD  vancomycin  (VANCOCIN ) 125 MG capsule Take 1 capsule (125 mg total) by mouth 4 (four) times daily -  before meals and at bedtime for 10 days. 10/01/23 10/11/23  Awanda City, MD    Inpatient Medications: Scheduled Meds:  aspirin  EC  81 mg Oral Daily   [START ON  10/02/2023] ferrous sulfate   325 mg Oral Q M,W,F   finasteride   5 mg Oral Daily   gabapentin   100 mg Oral QHS   haloperidol  lactate  2 mg Intravenous Once   insulin  aspart  0-9 Units Subcutaneous TID WC   lisinopril   40 mg Oral Daily   pantoprazole   40 mg Oral Daily   tamsulosin   0.4 mg Oral Daily   vancomycin   125 mg Oral TID AC & HS   Continuous Infusions:   PRN Meds: acetaminophen  **OR** acetaminophen , hydrALAZINE , LORazepam , melatonin, ondansetron  (ZOFRAN ) IV, QUEtiapine   Allergies:    Allergies  Allergen Reactions   Amlodipine     Carvedilol  Other (See Comments)    bradycardia   Niaspan [Niacin Er (Antihyperlipidemic)]  Shellfish Allergy Other (See Comments)    Pt vomits   Statins Other (See Comments)    Myalgia   Sulfa Antibiotics    Zetia [Ezetimibe]    Januvia  [Sitagliptin ] Rash    Social History:   Social History   Socioeconomic History   Marital status: Married    Spouse name: [name]   Number of children: 4   Years of education: some college   Highest education level: 12th grade  Occupational History   Occupation: Retired-[employer]  Tobacco Use   Smoking status: Former    Current packs/day: 0.00    Average packs/day: 0.8 packs/day for 39.0 years (29.3 ttl pk-yrs)    Types: Cigarettes    Start date: 03/21/1954    Quit date: 03/21/1993    Years since quitting: 30.5   Smokeless tobacco: Never   Tobacco comments:    smoking cessation materials not required  Vaping Use   Vaping status: Never Used  Substance and Sexual Activity   Alcohol  use: No    Alcohol /week: 0.0 standard drinks of alcohol    Drug use: No   Sexual activity: Yes  Other Topics Concern   Not on file  Social History Narrative   Lives with wife in [place], [place].   Social Drivers of Corporate Investment Banker Strain: Low Risk  (09/18/2020)   Overall Financial Resource Strain (CARDIA)    Difficulty of Paying Living Expenses: Not very hard  Food Insecurity: No Food Insecurity  (03/19/2023)   Hunger Vital Sign    Worried About Running Out of Food in the Last Year: Never true    Ran Out of Food in the Last Year: Never true  Transportation Needs: No Transportation Needs (03/19/2023)   PRAPARE - Administrator, Civil Service (Medical): No    Lack of Transportation (Non-Medical): No  Physical Activity: Inactive (09/18/2020)   Exercise Vital Sign    Days of Exercise per Week: 0 days    Minutes of Exercise per Session: 0 min  Stress: No Stress Concern Present (09/18/2020)   [employer] of Occupational Health - Occupational Stress Questionnaire    Feeling of Stress : Not at all  Social Connections: Moderately Integrated (09/18/2020)   Social Connection and Isolation Panel [NHANES]    Frequency of Communication with Friends and Family: More than three times a week    Frequency of Social Gatherings with Friends and Family: More than three times a week    Attends Religious Services: More than 4 times per year    Active Member of [employer] or Organizations: No    Attends Banker Meetings: Never    Marital Status: Married  Catering Manager Violence: Not At Risk (03/19/2023)   Humiliation, Afraid, Rape, and Kick questionnaire    Fear of Current or Ex-Partner: No    Emotionally Abused: No    Physically Abused: No    Sexually Abused: No    Family History:   Family History  Problem Relation Age of Onset   Hyperlipidemia Father    Congestive Heart Failure Other    Heart attack Other    Heart disease Other    Diabetes Brother      ROS:  Please see the history of present illness.  All other ROS reviewed and negative.     Physical Exam/Data:   Vitals:   10/01/23 0945 10/01/23 1015 10/01/23 1052 10/01/23 1100  BP: (!) 121/57 139/71  (!) 182/78  Pulse: 79 74  96  Resp:  18 18  18   Temp:   98.8 F (37.1 C)   TempSrc:   Oral   SpO2: 100% 100%  100%  Weight:      Height:        Intake/Output Summary (Last 24 hours) at 10/01/2023 1426 Last  data filed at 10/01/2023 0815 Gross per 24 hour  Intake 10 ml  Output --  Net 10 ml      09/30/2023   11:20 PM 03/24/2023    5:52 AM 03/22/2023    6:13 AM  Last 3 Weights  Weight (lbs) 136 lb 11 oz 132 lb 7.9 oz 131 lb 13.4 oz  Weight (kg) 62 kg 60.1 kg 59.8 kg     Body mass index is 20.18 kg/m.  General: Laying in bed in some mild distress and anxious with daughter and nurse at bedside  Neck: no JVD Vascular: Distal pulses 2+ bilaterally Cardiac:  normal S1, S2; RRR; radial pulses 2+  Lungs:  clear to auscultation bilaterally, no wheezing, rhonchi or rales  Abd: soft, nontender, no hepatomegaly  Ext: no edema Skin: warm and dry  Psych:  Mentally impaired due to dementia history  EKG:  The EKG was personally reviewed and demonstrates:  interpretation is limited by baseline artifact but appears to show NSR with HR at 84. Telemetry:  Telemetry was personally reviewed and demonstrates: Ventricular bigemy and Nonconducted PAC noted around bradycardia of 40's between 820-845. Afterwards, Sinus bradycardia of 60-70's noted. Also, NSR with PVC noted with HR in 80's.   Relevant CV Studies: Echocardiogram 01/30/2021 1. Left ventricular ejection fraction, by estimation, is 70 to 75%. The  left ventricle has hyperdynamic function. The left ventricle has no  regional wall motion abnormalities. There is moderate concentric left  ventricular hypertrophy. Left ventricular  diastolic parameters are consistent with Grade I diastolic dysfunction  (impaired relaxation).   2. Right ventricular systolic function is normal. The right ventricular  size is normal. Mildly increased right ventricular wall thickness.  Tricuspid regurgitation signal is inadequate for assessing PA pressure.   3. Left atrial size was mildly dilated.   4. The mitral valve is normal in structure. No evidence of mitral valve  regurgitation.   5. The aortic valve has been repaired/replaced. Aortic valve  regurgitation is mild.  There is a valve present in the aortic position.  Echo findings are consistent with perivalvular leak of the aortic  prosthesis. Aortic valve mean gradient measures 9.4  mmHg. Aortic valve Vmax measures 1.97 m/s. Aortic valve acceleration time  measures 60 msec.   6. Aortic dilatation noted. There is mild dilatation of the aortic root,  measuring 39 mm.   7. The inferior vena cava is normal in size with greater than 50%  respiratory variability, suggesting right atrial pressure of 3 mmHg.   Comparison(s): No significant change from prior study. Prior images  reviewed side by side.   Laboratory Data:  High Sensitivity Troponin:   Recent Labs  Lab 09/30/23 2329 10/01/23 0141 10/01/23 0535  TROPONINIHS 6 7 8      Chemistry Recent Labs  Lab 09/30/23 2329 10/01/23 0535  NA 138 139  K 4.0 4.5  CL 108 108  CO2 24 23  GLUCOSE 209* 224*  BUN 21 24*  CREATININE 1.63* 1.55*  CALCIUM  9.6 8.8*  MG  --  2.0  GFRNONAA 40* 43*  ANIONGAP 6 8    Recent Labs  Lab 09/30/23 2329 10/01/23 0535  PROT 7.2 5.9*  ALBUMIN  3.7 3.0*  AST 20 19  ALT 11 11  ALKPHOS 58 41  BILITOT 0.5 0.4   Lipids No results for input(s): CHOL, TRIG, HDL, LABVLDL, LDLCALC, CHOLHDL in the last 168 hours.  Hematology Recent Labs  Lab 09/30/23 2329 10/01/23 0535  WBC 5.7 6.7  RBC 3.56* 3.03*  HGB 10.2* 8.7*  HCT 31.6* 26.8*  MCV 88.8 88.4  MCH 28.7 28.7  MCHC 32.3 32.5  RDW 14.7 14.8  PLT 217 184   Thyroid No results for input(s): TSH, FREET4 in the last 168 hours.  BNP Recent Labs  Lab 10/01/23 0535  BNP 113.8*    DDimer No results for input(s): DDIMER in the last 168 hours.   Radiology/Studies:  CT Head Wo Contrast Result Date: 10/01/2023 CLINICAL DATA:  Mental status change of unknown cause. Sudden onset of unresponsiveness. Decreased heart rate. EXAM: CT HEAD WITHOUT CONTRAST TECHNIQUE: Contiguous axial images were obtained from the base of the skull through the vertex  without intravenous contrast. RADIATION DOSE REDUCTION: This exam was performed according to the departmental dose-optimization program which includes automated exposure control, adjustment of the mA and/or kV according to patient size and/or use of iterative reconstruction technique. COMPARISON:  None Available. FINDINGS: Brain: Diffuse cerebral atrophy. Ventricular dilatation consistent with central atrophy. Low-attenuation changes in the deep white matter consistent with small vessel ischemia. No abnormal extra-axial fluid collections. No mass effect or midline shift. Gray-white matter junctions are distinct. Basal cisterns are not effaced. No acute intracranial hemorrhage. Cavum septum pellucidum. Vascular: No hyperdense vessel or unexpected calcification. Skull: Normal. Negative for fracture or focal lesion. Sinuses/Orbits: No acute finding. Other: None. IMPRESSION: No acute intracranial abnormalities. Chronic atrophy and small vessel ischemic changes. Electronically Signed   By: Elsie Gravely M.D.   On: 10/01/2023 00:12   DG Chest Portable 1 View Result Date: 09/30/2023 CLINICAL DATA:  Bradycardia EXAM: PORTABLE CHEST 1 VIEW COMPARISON:  01/31/2020 FINDINGS: Lungs are clear. No pneumothorax or pleural effusion. Cardiac size within normal limits. Transcatheter aortic valve replacement has been performed. Pulmonary vascularity is normal. No acute bone abnormality IMPRESSION: 1. No active disease. Electronically Signed   By: Dorethia Molt M.D.   On: 09/30/2023 23:56     Assessment and Plan:   Syncope Bradycardia  - Per ED note, Patient's wife reported he was sitting in chair eating when she notice he became unconscious. She denied any seizure or stroke like symptoms. She denied any loss of bowel/bladder incontinence. Due to dementia history, patient is a poor historian. His daughter is the bedside patient advocate during my interview.  - Initial VS: BP 141/74, HR 67, O2 100%.  - Initial EKG:  interpretation is limited by baseline artifact but appears to show NSR with HR at 84.  - CT head and CXR showed no acute findings.  - While in the ED this am between 830-845, his heart rate has dropped to 40's but is currently stable at 83.  - Telemetry revealed predominantly NSR with occasional ventricular bigemy. Between about 820-845 this AM, patient became bradycardic with HR in the 40s. Appears to have been having frequent, non-conducted PACs during this time. Since then, he has been in  NSR with HR in the 80s. He was asymptomatic when bradycardic, no syncope/near syncope  - When evaluated the patient, he was very agitated and was pulling at his IVs. The nurse at bedside reported that patient had been very noncooperative and attempting to remove IV and get out of bed. The nurse ask for some  anxiolytic medical to calm the patient down. Ativan  1 mg was prescribed.  - Echo was ordered, but I do not think patient will be able to tolerate study. - Overall, I have a low suspicion for cardiac syncope. He has C. Diff which may have contributed to his presentation. He became bradycardic in the ED, but was asymptomatic and no significant pauses or arrhythmias were noted. Unlikely he would tolerate outpatient monitor. No further workup needed from a cardiac standpoint.  - family eager to take patient home as he is agitated in the ED. I agree this is reasonable.   Severe aortic stenosis s/p TAVR in June 2021 - ECHO 01/24/2021 showed mild paravalvular lead with overall normal function of the TAVR valve with mean gradient of 9.4 mmHg  - Recommend outpatient echocardiogram for monitoring   Chronic diastolic heart failure - Most recent echo 01/2021 showed EF 70-75%, no regional wall motion abnormalities, grade I DD, normal RV function   - BNP 113.8 - Patient euvolemic on exam  - denies any edema or SOB. No diuretics for now   Essential hypertension - Per ED home medications review, no hypertension medications  are noted in chart - Daughter reports the patient is taking HTN medications but can not confirm which ones.   CKD 3B - Today Cr 1.55, GFR 43  Otherwise managed by PCP - C diff  - Dementia - DM2 - BPH - Anemia     Risk Assessment/Risk Scores:   For questions or updates, please contact [facility] Please consult [email or web address] for contact info under    Signed, Rollo FABIENE Louder, PA-C  10/01/2023 2:26 PM   Personally seen and examined. Agree with above.  89 year old male with dementia with reported episode of unresponsiveness while sitting up in a chair eating.  He was brought to the emergency room for evaluation of syncope.  Here in the emergency room monitor showed occasional PACs and PVCs, occasionally in bigeminy pattern.  No atrial fibrillation, no pauses, no ventricular tachycardia.  TAVR valve placed in 2021.  Dr. Raford has been following.  Mr. Jutras has been quite confused in the emergency department, wanting to get up.  Family at bedside.  Laying comfortably in bed.  Telemetry reviewed as above without any adverse arrhythmias noted.  Normal respiratory efforts.  Heart regular rate and rhythm soft systolic murmur, prior TAVR.  History gathered by wife and daughter.  2022 EF 75% Creatinine 1.55 at baseline hemoglobin 10.2 chest x-ray shows no active disease  CT of head normal.  Low suspicion at this point for cardiac related syncope.  Unknown reason for unresponsiveness.  Likely related to underlying baseline dementia.  Family is very eager to take home.  I agree.  I do not think we would have much success and monitoring him with ZIO monitor given his baseline confusion.  He may be discharged  Other comorbidities include C. difficile, dementia, diabetes, BPH, anemia, TAVR 2021  Oneil Parchment, MD   Discussed case with Dr. Awanda.

## 2023-11-14 ENCOUNTER — Emergency Department (HOSPITAL_COMMUNITY)

## 2023-11-14 ENCOUNTER — Emergency Department (HOSPITAL_COMMUNITY)
Admission: EM | Admit: 2023-11-14 | Discharge: 2023-11-14 | Disposition: A | Discharge status: Home / Self Care | Attending: Emergency Medicine | Admitting: Emergency Medicine

## 2023-11-14 DIAGNOSIS — Z7982 Long term (current) use of aspirin: Secondary | ICD-10-CM | POA: Insufficient documentation

## 2023-11-14 DIAGNOSIS — R569 Unspecified convulsions: Secondary | ICD-10-CM | POA: Insufficient documentation

## 2023-11-14 DIAGNOSIS — I509 Heart failure, unspecified: Secondary | ICD-10-CM | POA: Diagnosis not present

## 2023-11-14 DIAGNOSIS — Z79899 Other long term (current) drug therapy: Secondary | ICD-10-CM | POA: Insufficient documentation

## 2023-11-14 DIAGNOSIS — E1122 Type 2 diabetes mellitus with diabetic chronic kidney disease: Secondary | ICD-10-CM | POA: Insufficient documentation

## 2023-11-14 DIAGNOSIS — F0390 Unspecified dementia without behavioral disturbance: Secondary | ICD-10-CM | POA: Diagnosis present

## 2023-11-14 DIAGNOSIS — Z87891 Personal history of nicotine dependence: Secondary | ICD-10-CM | POA: Insufficient documentation

## 2023-11-14 DIAGNOSIS — I130 Hypertensive heart and chronic kidney disease with heart failure and stage 1 through stage 4 chronic kidney disease, or unspecified chronic kidney disease: Secondary | ICD-10-CM | POA: Insufficient documentation

## 2023-11-14 DIAGNOSIS — Z7984 Long term (current) use of oral hypoglycemic drugs: Secondary | ICD-10-CM | POA: Insufficient documentation

## 2023-11-14 DIAGNOSIS — Z794 Long term (current) use of insulin: Secondary | ICD-10-CM | POA: Diagnosis not present

## 2023-11-14 DIAGNOSIS — N183 Chronic kidney disease, stage 3 unspecified: Secondary | ICD-10-CM | POA: Insufficient documentation

## 2023-11-14 LAB — CBC WITH DIFFERENTIAL/PLATELET
Abs Immature Granulocytes: 0.02 10*3/uL (ref 0.00–0.07)
Basophils Absolute: 0 10*3/uL (ref 0.0–0.1)
Basophils Relative: 0 %
Eosinophils Absolute: 0.4 10*3/uL (ref 0.0–0.5)
Eosinophils Relative: 6 %
HCT: 28.5 % — ABNORMAL LOW (ref 39.0–52.0)
Hemoglobin: 9.2 g/dL — ABNORMAL LOW (ref 13.0–17.0)
Immature Granulocytes: 0 %
Lymphocytes Relative: 24 %
Lymphs Abs: 1.5 10*3/uL (ref 0.7–4.0)
MCH: 28.8 pg (ref 26.0–34.0)
MCHC: 32.3 g/dL (ref 30.0–36.0)
MCV: 89.3 fL (ref 80.0–100.0)
Monocytes Absolute: 0.6 10*3/uL (ref 0.1–1.0)
Monocytes Relative: 9 %
Neutro Abs: 3.8 10*3/uL (ref 1.7–7.7)
Neutrophils Relative %: 61 %
Platelets: 193 10*3/uL (ref 150–400)
RBC: 3.19 MIL/uL — ABNORMAL LOW (ref 4.22–5.81)
RDW: 14.3 % (ref 11.5–15.5)
WBC: 6.3 10*3/uL (ref 4.0–10.5)
nRBC: 0 % (ref 0.0–0.2)

## 2023-11-14 LAB — COMPREHENSIVE METABOLIC PANEL
ALT: 9 U/L (ref 0–44)
AST: 16 U/L (ref 15–41)
Albumin: 3.4 g/dL — ABNORMAL LOW (ref 3.5–5.0)
Alkaline Phosphatase: 42 U/L (ref 38–126)
Anion gap: 6 (ref 5–15)
BUN: 17 mg/dL (ref 8–23)
CO2: 25 mmol/L (ref 22–32)
Calcium: 9.1 mg/dL (ref 8.9–10.3)
Chloride: 108 mmol/L (ref 98–111)
Creatinine, Ser: 1.51 mg/dL — ABNORMAL HIGH (ref 0.61–1.24)
GFR, Estimated: 44 mL/min — ABNORMAL LOW (ref >60)
Glucose, Bld: 218 mg/dL — ABNORMAL HIGH (ref 70–99)
Potassium: 4 mmol/L (ref 3.5–5.1)
Sodium: 139 mmol/L (ref 135–145)
Total Bilirubin: 0.4 mg/dL (ref 0.0–1.2)
Total Protein: 6.5 g/dL (ref 6.5–8.1)

## 2023-11-14 LAB — URINALYSIS, ROUTINE W REFLEX MICROSCOPIC
Bilirubin Urine: NEGATIVE
Glucose, UA: >=500 mg/dL — AB
Hgb urine dipstick: NEGATIVE
Ketones, ur: NEGATIVE mg/dL
Leukocytes,Ua: NEGATIVE
Nitrite: NEGATIVE
Protein, ur: NEGATIVE mg/dL
Specific Gravity, Urine: 1.02 (ref 1.005–1.030)
pH: 7 (ref 5.0–8.0)

## 2023-11-14 LAB — URINALYSIS, MICROSCOPIC (REFLEX)
RBC / HPF: NONE SEEN RBC/hpf (ref 0–5)
WBC, UA: NONE SEEN WBC/hpf (ref 0–5)

## 2023-11-14 LAB — MAGNESIUM: Magnesium: 1.8 mg/dL (ref 1.7–2.4)

## 2023-11-14 NOTE — ED Notes (Signed)
 Patient transported to CT

## 2023-11-14 NOTE — ED Provider Notes (Addendum)
 [name] Provider Note   CSN: [id number] Arrival date & time: 11/14/23  1225     History  No chief complaint on file.   Brian [name] is a 89 y.o. male.  Patient has a history of dementia.  Brought from home.  Wife said that he had tonic-clonic movement.  P TAR was on the scene first said the patient seemed to be postictal.  By the time EMS got there patient did not appear to be postictal anymore.  Patient following commands.  Awake and alert.  5 family history supposedly had a seizure event in the past that they thought was related to C. difficile.  Patient had been on antibiotics for that.  He is completed all that.  Patient no longer in any abdominal pain or having any diarrhea issues according to family.  Patient's last head CT was in February according to notes.  Sounds like may have there was syncopal and some abnormal vital signs at that time.  No mention of potential seizure.  Past medical history significant for type 2 diabetes hypertension hyperlipidemia chronic kidney disease stage III diastolic dysfunction with heart failure dementia hypertension transit catheter aortic valve replacement.  Former smoker quit 1994.       Home Medications Prior to Admission medications   Medication Sig Start Date End Date Taking? Authorizing Provider  ACCU-CHEK GUIDE test strip USE UP TO 4 TIMES A DAY AS DIRECTED 03/06/19   Poulose, Percell Belt, NP  Accu-Chek Softclix Lancets lancets  09/01/19   [provider]  aspirin EC 81 MG tablet Take 81 mg by mouth daily.     [provider]  docusate sodium (DOK) 100 MG capsule Take 1 capsule (100 mg total) by mouth 2 (two) times daily as needed. Patient taking differently: Take 100 mg by mouth 2 (two) times daily as needed for mild constipation. 10/27/17   Kerman Passey, MD  ferrous sulfate 325 (65 FE) MG tablet Take 1 tablet (325 mg total) by mouth every other day. Patient taking  differently: Take 325 mg by mouth every Monday, Wednesday, and Friday. 03/24/23 10/10/23  Zigmund Daniel., MD  finasteride (PROSCAR) 5 MG tablet Take 1 tablet (5 mg total) by mouth daily. 06/30/19   Danelle Berry, PA-C  gabapentin (NEURONTIN) 100 MG capsule Take 100 mg by mouth at bedtime.    [provider]  insulin glargine (LANTUS) 100 UNIT/ML injection Inject 5 Units into the skin daily.    [provider]  Insulin Pen Needle (B-D UF III MINI PEN NEEDLES) 31G X 5 MM MISC USE AS DIRECTED WITH LEVEMIR INSULIN PENS AT BEDTIME, once a day 10/20/18   [name], Janit Bern, MD  lisinopril (ZESTRIL) 40 MG tablet Take 1 tablet (40 mg total) by mouth daily. Home med. 10/01/23   Darlin Priestly, MD  Omega-3 Fatty Acids (FISH OIL) 1000 MG CAPS Take 1 capsule (1,000 mg total) by mouth daily. 12/04/17   Chilton Si, MD  pantoprazole (PROTONIX) 40 MG tablet Take 1 tablet (40 mg total) by mouth 2 (two) times daily. After completing 2 months, take 40 mg daily. 03/24/23 09/30/24  Zigmund Daniel., MD  QUEtiapine (SEROQUEL) 25 MG tablet Take 25 mg by mouth at bedtime as needed (for agitation). 09/22/23   [provider]  tamsulosin (FLOMAX) 0.4 MG CAPS capsule TAKE 1 CAPSULE BY MOUTH EVERY DAY Patient taking differently: Take 0.4 mg by mouth daily. 11/03/19  Danelle Berry, PA-C      Allergies    Amlodipine, Carvedilol, Niaspan [niacin er (antihyperlipidemic)], Shellfish allergy, Statins, Sulfa antibiotics, Zetia [ezetimibe], and Januvia [sitagliptin]    Review of Systems   Review of Systems  Unable to perform ROS: Dementia    Physical Exam Updated Vital Signs BP (!) 176/70   Pulse (!) 53   Temp 97.6 F (36.4 C) (Oral)   Resp 20   SpO2 100%  Physical Exam Vitals and nursing note reviewed.  Constitutional:      General: He is not in acute distress.    Appearance: Normal appearance. He is well-developed. He is not ill-appearing.  HENT:     Head: Normocephalic and atraumatic.      Mouth/Throat:     Mouth: Mucous membranes are moist.  Eyes:     Extraocular Movements: Extraocular movements intact.     Conjunctiva/sclera: Conjunctivae normal.     Pupils: Pupils are equal, round, and reactive to light.  Cardiovascular:     Rate and Rhythm: Normal rate and regular rhythm.     Heart sounds: No murmur heard. Pulmonary:     Effort: Pulmonary effort is normal. No respiratory distress.     Breath sounds: Normal breath sounds.  Abdominal:     General: There is no distension.     Palpations: Abdomen is soft.     Tenderness: There is no abdominal tenderness. There is no guarding.  Musculoskeletal:        General: No swelling.     Cervical back: Normal range of motion and neck supple.     Right lower leg: No edema.     Left lower leg: No edema.  Skin:    General: Skin is warm and dry.     Capillary Refill: Capillary refill takes less than 2 seconds.  Neurological:     General: No focal deficit present.     Mental Status: He is alert. Mental status is at baseline.     Cranial Nerves: No cranial nerve deficit.     Sensory: No sensory deficit.     Motor: No weakness.  Psychiatric:        Mood and Affect: Mood normal.     ED Results / Procedures / Treatments   Labs (all labs ordered are listed, but only abnormal results are displayed) Labs Reviewed  CBC WITH DIFFERENTIAL/PLATELET - Abnormal; Notable for the following components:      Result Value   RBC 3.19 (*)    Hemoglobin 9.2 (*)    HCT 28.5 (*)    All other components within normal limits  COMPREHENSIVE METABOLIC PANEL - Abnormal; Notable for the following components:   Glucose, Bld 218 (*)    Creatinine, Ser 1.51 (*)    Albumin 3.4 (*)    GFR, Estimated 44 (*)    All other components within normal limits  URINALYSIS, ROUTINE W REFLEX MICROSCOPIC - Abnormal; Notable for the following components:   Glucose, UA >=500 (*)    All other components within normal limits  URINALYSIS, MICROSCOPIC (REFLEX) -  Abnormal; Notable for the following components:   Bacteria, UA RARE (*)    All other components within normal limits  MAGNESIUM    EKG EKG Interpretation Date/Time:  Saturday November 14 2023 12:34:58 EDT Ventricular Rate:  50 PR Interval:  184 QRS Duration:  90 QT Interval:  422 QTC Calculation: 385 R Axis:   43  Text Interpretation: Sinus rhythm Confirmed by Vanetta Mulders [phone]) on 11/14/2023  12:44:21 PM  Radiology DG Chest Port 1 View Result Date: 11/14/2023 CLINICAL DATA:  Seizure and bradycardia EXAM: PORTABLE CHEST 1 VIEW COMPARISON:  Chest radiograph dated 09/30/2023 FINDINGS: Normal lung volumes. No focal consolidations. No pleural effusion or pneumothorax. Similar postsurgical cardiomediastinal silhouette. No acute osseous abnormality. IMPRESSION: No acute disease. Electronically Signed   By: Agustin Cree M.D.   On: 11/14/2023 15:24   CT Head Wo Contrast Result Date: 11/14/2023 CLINICAL DATA:  New onset seizure. EXAM: CT HEAD WITHOUT CONTRAST TECHNIQUE: Contiguous axial images were obtained from the base of the skull through the vertex without intravenous contrast. RADIATION DOSE REDUCTION: This exam was performed according to the departmental dose-optimization program which includes automated exposure control, adjustment of the mA and/or kV according to patient size and/or use of iterative reconstruction technique. COMPARISON:  10/01/2023 FINDINGS: Brain: No evidence of intracranial hemorrhage, acute infarction, hydrocephalus, extra-axial collection, or mass lesion/mass effect. Moderate cerebral atrophy with ex vacuo dilatation of the lateral and 3rd ventricles remains stable. Moderate chronic small vessel disease is also unchanged in appearance. Vascular:  No hyperdense vessel or other acute findings. Skull: No evidence of fracture or other significant bone abnormality. Sinuses/Orbits:  No acute findings. Other: None. IMPRESSION: No acute intracranial abnormality. Stable moderate  cerebral atrophy and chronic small vessel disease. Electronically Signed   By: Danae Orleans M.D.   On: 11/14/2023 14:52    Procedures Procedures    Medications Ordered in ED Medications - No data to display  ED Course/ Medical Decision Making/ A&P Clinical Course as of 11/14/23 1556  Sat Nov 14, 2023  1534 Stable HO SZ Seizure like episdoe today Alzheimer's disease Work up all negative. [CC]    Clinical Course User Index [CC] Glyn Ade, MD                                 Medical Decision Making Amount and/or Complexity of Data Reviewed Labs: ordered. Radiology: ordered.  Questionable seizure activity.  Patient without any focal deficits currently.  Does have a history of dementia will follow commands fine.  Will check labs get head CT cardiac monitoring and will get chest x-ray because of his history of congestive heart failure.  But he has no lower extremity edema.   Head CT is pending.  Urinalysis negative CBC no leukocytosis hemoglobin down a little bit 9.2 platelets 193.  Complete metabolic panel GFR 44 potassium normal glucose up a little bit at 118 magnesium normal.  CT head no acute intracranial abnormality.  Chest x-ray also negative.  Discussion with patient's wife.  [facility]-up.  Patient has been very stable here.  Hemoglobin is down a little bit at 9.2.  No further seizure or seizure-like activity.   Final Clinical Impression(s) / ED Diagnoses Final diagnoses:  Dementia, unspecified dementia severity, unspecified dementia type, unspecified whether behavioral, psychotic, or mood disturbance or anxiety (HCC)  Seizure-like activity [facility])    Rx / DC Orders ED Discharge Orders     None         Vanetta Mulders, MD 11/14/23 1251    Vanetta Mulders, MD 11/14/23 1520    Vanetta Mulders, MD 11/14/23 1556

## 2023-11-14 NOTE — Discharge Instructions (Signed)
 [facility] and also you have the option to follow-up with [facility] neurology.  Workup here today without any acute findings.  Not clear whether these are true seizures or seizure-like activity.  Return for any new or worse symptoms.

## 2023-11-14 NOTE — ED Triage Notes (Signed)
 Pt BIB [facility] for Seizure/bradycardia.  EMS states that wife reports pt had a seizure described as [employer] in nature.Marland Kitchen  PTAR arrived on scene first and described pt as postictal.  [facility] joined them on scene and accompanied for transport.  Pt was also found to be bradycardic in the 40's.    PT has Alzhiemers and has some sundowning with tendency toward combativeness and wandering. Pt takes Seroquel and Gabapentin for that per EMS.   Last EMS VS: 176/90 97% RA, HR 50-55,  CBG 220.

## 2023-11-14 NOTE — ED Notes (Signed)
 Pt uses urinal often.

## 2024-02-23 NOTE — Progress Notes (Signed)
 Rheumatology Follow Up Note  Chief Complaint  Patient presents with  . Undifferentiated inflammatory arthritis      Subjective:HPI  Brian [name] is a 89 y.o. male is here today for follow up of inflammatory arthritis. The patient's allergies, current medications, past family history, past medical history, past social history, past surgical history and problem list were reviewed and updated as appropriate.   He has pain of both shoulders. The pains are severe. He also has pain and swelling of his hand joints.  He has no swelling of the hand joints. He has pains of the biceps, triceps but none of the trapezius or quads. He denies any temporal headaches or blurry vision. He has no jaw pains. He does use Voltaren Gel. The pains are worse at 3-4 am in the morning.   He is taking the gabapentin . He is tolerating this well. This is helping his pains.   Review of Systems:   Review of Systems  Constitutional:  Negative for fatigue.  HENT:  Negative for mouth sores and trouble swallowing.        Neg: Dry Mouth  Eyes:  Negative for redness.       Neg: Dry Eyes  Respiratory:  Negative for cough and shortness of breath.   Cardiovascular:  Negative for chest pain and leg swelling.  Gastrointestinal:  Negative for constipation, diarrhea and nausea.  Endocrine: Negative for cold intolerance and heat intolerance.  Genitourinary:  Negative for hematuria.  Musculoskeletal:        Per HPI  Skin:  Negative for color change and rash.  Neurological:  Negative for dizziness, weakness, numbness and headaches.       Memory Loss  Hematological:  Does not bruise/bleed easily.  Psychiatric/Behavioral:  Negative for dysphoric mood and sleep disturbance. The patient is not nervous/anxious.   All other systems reviewed and are negative.   Objective:  Vitals:   02/23/24 1508  BP: (!) 142/68  Weight: 67.6 kg (149 lb 0.5 oz)  Height: 177.8 cm (5' 10)  PainSc: 10-Worst pain ever      Length of  Stiffness: Several Hours   GEN - Pleasant, No Apparent Distress, Thin  HEENT - normocephalic and atraumatic. Conjunctiva Clear. No Temporal Artery Tenderness Neck - supple with no adenopathy or thyromegaly.  Heart - regular rate and rhythm, No gallops/rub, Nml S1S2, Systolic Murmur Lungs - clear to auscultation in all fields. Extremities - there is no cyanosis or edema. Neurological - alert and oriented.  Spine - no paraspinal tenderness; no L spine tenderness Skin - no rashes observed MSK - The following joints were examined bilaterally: Hands, Wrists, Elbows, Shoulders, Metatarsals, Ankels, Knees and Hips; they were normal apart from what is noted.    100% Fist Formation PIP Enlargement Tenderness of the PIP Joints Both Biceps and Shoulders with Tenderness  No Dactylitis No Tender Point ______________________________________________________________________ Labs/Imaging Reviewed in EMR Cr 1.51, AST 16, ALT 09 CBC Hgb 9.2, Hct 28.5, Plt 193 CK 85 Upr/cr 0.208 Uric Acid 3.0 SPEP : No M Spike ESR 52 (H) CRP <1 Neg: 14.3.3 ETA Rheum, AntiCCP, SSA, SSB, Anti-CarP, Anti-CEP-1, Anti-Sa  Neg: Quantiferon, Hep B and C   DEXA Scan (11/2021): Normal   Assessment and Plan   Diagnoses and all orders for this visit:  Undifferentiated inflammatory arthritis -     predniSONE  (DELTASONE ) 5 MG tablet; Take 4 tablets (20 mg total) by mouth once daily for 7 days, THEN 3 tablets (15 mg total) once daily for  7 days, THEN 2 tablets (10 mg total) once daily for 7 days, THEN 1 tablet (5 mg total) once daily for 7 days. -     Sedimentation Rate-Automated -     C-Reactive Protein, Quant - [facility] -     CBC w/auto Differential (5 Part) -     Comprehensive Metabolic Panel (CMP)  Anemia, unspecified type -     CBC w/auto Differential (5 Part)    -- He has an inflammatory arthritis. He also gets pain of the biceps, trapezius and quads. He has flares since his is off the prednisone  and DMARD  therapy.   -- Did not tolerate methotrexate.  -- He stopped Plaquenil due to GI upset. -- Unable to use leflunomide due to neuropathy  -- Start Prednisone  course of prednisone  -- Continue Gabapentin  300 mg at bedtime  -- Ok to take Tylenol  for pain control  -- Check Labs  Return in about 6 weeks (around 04/05/2024) for Routine Follow Up.   All new prescription medications, changes in current prescription dosages, and sample medications were discussed with the patient, including patient education, medication name, use, dosage, potential side effects, drug interactions, consequences of not using/taking, and special instructions.  Patient expressed understanding.  No barriers to adherence.   I appreciate the opportunity to participate in the care of Brian [name]. Please do not hesitate to contact me with any questions or concerns that may arise in regards to the patient's rheumatologic disease.   I personally performed the service. (TP)  MAYUR LOREE BLANCH, MD

## 2024-04-22 ENCOUNTER — Emergency Department (HOSPITAL_COMMUNITY)

## 2024-04-22 ENCOUNTER — Encounter (HOSPITAL_COMMUNITY): Payer: Self-pay

## 2024-04-22 ENCOUNTER — Inpatient Hospital Stay (HOSPITAL_COMMUNITY)
Admission: EM | Admit: 2024-04-22 | Discharge: 2024-04-24 | DRG: 872 | Disposition: A | Discharge status: Home Health | Attending: Internal Medicine | Admitting: Internal Medicine

## 2024-04-22 ENCOUNTER — Other Ambulatory Visit: Payer: Self-pay

## 2024-04-22 DIAGNOSIS — F0280 Dementia in other diseases classified elsewhere without behavioral disturbance: Secondary | ICD-10-CM | POA: Diagnosis not present

## 2024-04-22 DIAGNOSIS — N3001 Acute cystitis with hematuria: Principal | ICD-10-CM | POA: Diagnosis present

## 2024-04-22 DIAGNOSIS — G309 Alzheimer's disease, unspecified: Secondary | ICD-10-CM | POA: Diagnosis present

## 2024-04-22 DIAGNOSIS — Z888 Allergy status to other drugs, medicaments and biological substances status: Secondary | ICD-10-CM | POA: Diagnosis not present

## 2024-04-22 DIAGNOSIS — K219 Gastro-esophageal reflux disease without esophagitis: Secondary | ICD-10-CM | POA: Diagnosis present

## 2024-04-22 DIAGNOSIS — R531 Weakness: Secondary | ICD-10-CM

## 2024-04-22 DIAGNOSIS — Z833 Family history of diabetes mellitus: Secondary | ICD-10-CM

## 2024-04-22 DIAGNOSIS — Z79899 Other long term (current) drug therapy: Secondary | ICD-10-CM

## 2024-04-22 DIAGNOSIS — E1122 Type 2 diabetes mellitus with diabetic chronic kidney disease: Secondary | ICD-10-CM | POA: Diagnosis present

## 2024-04-22 DIAGNOSIS — Z794 Long term (current) use of insulin: Secondary | ICD-10-CM

## 2024-04-22 DIAGNOSIS — F02C0 Dementia in other diseases classified elsewhere, severe, without behavioral disturbance, psychotic disturbance, mood disturbance, and anxiety: Secondary | ICD-10-CM | POA: Diagnosis present

## 2024-04-22 DIAGNOSIS — R823 Hemoglobinuria: Secondary | ICD-10-CM | POA: Diagnosis present

## 2024-04-22 DIAGNOSIS — Z8249 Family history of ischemic heart disease and other diseases of the circulatory system: Secondary | ICD-10-CM | POA: Diagnosis not present

## 2024-04-22 DIAGNOSIS — A419 Sepsis, unspecified organism: Principal | ICD-10-CM | POA: Diagnosis present

## 2024-04-22 DIAGNOSIS — E872 Acidosis, unspecified: Secondary | ICD-10-CM | POA: Diagnosis present

## 2024-04-22 DIAGNOSIS — Z7982 Long term (current) use of aspirin: Secondary | ICD-10-CM

## 2024-04-22 DIAGNOSIS — Z952 Presence of prosthetic heart valve: Secondary | ICD-10-CM | POA: Diagnosis not present

## 2024-04-22 DIAGNOSIS — M1380 Other specified arthritis, unspecified site: Secondary | ICD-10-CM | POA: Diagnosis present

## 2024-04-22 DIAGNOSIS — Z87891 Personal history of nicotine dependence: Secondary | ICD-10-CM | POA: Diagnosis not present

## 2024-04-22 DIAGNOSIS — I5032 Chronic diastolic (congestive) heart failure: Secondary | ICD-10-CM | POA: Diagnosis present

## 2024-04-22 DIAGNOSIS — Z91013 Allergy to seafood: Secondary | ICD-10-CM | POA: Diagnosis not present

## 2024-04-22 DIAGNOSIS — I130 Hypertensive heart and chronic kidney disease with heart failure and stage 1 through stage 4 chronic kidney disease, or unspecified chronic kidney disease: Secondary | ICD-10-CM | POA: Diagnosis present

## 2024-04-22 DIAGNOSIS — E1165 Type 2 diabetes mellitus with hyperglycemia: Secondary | ICD-10-CM | POA: Diagnosis present

## 2024-04-22 DIAGNOSIS — N400 Enlarged prostate without lower urinary tract symptoms: Secondary | ICD-10-CM | POA: Diagnosis present

## 2024-04-22 DIAGNOSIS — E1140 Type 2 diabetes mellitus with diabetic neuropathy, unspecified: Secondary | ICD-10-CM | POA: Diagnosis present

## 2024-04-22 DIAGNOSIS — A4151 Sepsis due to Escherichia coli [E. coli]: Secondary | ICD-10-CM | POA: Diagnosis present

## 2024-04-22 DIAGNOSIS — E785 Hyperlipidemia, unspecified: Secondary | ICD-10-CM | POA: Diagnosis present

## 2024-04-22 DIAGNOSIS — N1832 Chronic kidney disease, stage 3b: Secondary | ICD-10-CM | POA: Diagnosis present

## 2024-04-22 DIAGNOSIS — D631 Anemia in chronic kidney disease: Secondary | ICD-10-CM | POA: Diagnosis present

## 2024-04-22 DIAGNOSIS — N390 Urinary tract infection, site not specified: Secondary | ICD-10-CM | POA: Diagnosis not present

## 2024-04-22 DIAGNOSIS — Z83438 Family history of other disorder of lipoprotein metabolism and other lipidemia: Secondary | ICD-10-CM

## 2024-04-22 LAB — URINALYSIS, W/ REFLEX TO CULTURE (INFECTION SUSPECTED)
Bilirubin Urine: NEGATIVE
Glucose, UA: 150 mg/dL — AB
Ketones, ur: NEGATIVE mg/dL
Nitrite: NEGATIVE
Protein, ur: 100 mg/dL — AB
Specific Gravity, Urine: 1.01 (ref 1.005–1.030)
WBC, UA: >50 WBC/hpf (ref 0–5)
pH: 7 (ref 5.0–8.0)

## 2024-04-22 LAB — COMPREHENSIVE METABOLIC PANEL WITH GFR
ALT: 15 U/L (ref 0–44)
AST: 22 U/L (ref 15–41)
Albumin: 3.4 g/dL — ABNORMAL LOW (ref 3.5–5.0)
Alkaline Phosphatase: 55 U/L (ref 38–126)
Anion gap: 13 (ref 5–15)
BUN: 20 mg/dL (ref 8–23)
CO2: 23 mmol/L (ref 22–32)
Calcium: 9.2 mg/dL (ref 8.9–10.3)
Chloride: 103 mmol/L (ref 98–111)
Creatinine, Ser: 1.64 mg/dL — ABNORMAL HIGH (ref 0.61–1.24)
GFR, Estimated: 40 mL/min — ABNORMAL LOW (ref >60)
Glucose, Bld: 226 mg/dL — ABNORMAL HIGH (ref 70–99)
Potassium: 4.1 mmol/L (ref 3.5–5.1)
Sodium: 139 mmol/L (ref 135–145)
Total Bilirubin: 1 mg/dL (ref 0.0–1.2)
Total Protein: 6.6 g/dL (ref 6.5–8.1)

## 2024-04-22 LAB — CBC WITH DIFFERENTIAL/PLATELET
Abs Immature Granulocytes: 0.02 K/uL (ref 0.00–0.07)
Basophils Absolute: 0 K/uL (ref 0.0–0.1)
Basophils Relative: 0 %
Eosinophils Absolute: 0.1 K/uL (ref 0.0–0.5)
Eosinophils Relative: 1 %
HCT: 27.4 % — ABNORMAL LOW (ref 39.0–52.0)
Hemoglobin: 8.7 g/dL — ABNORMAL LOW (ref 13.0–17.0)
Immature Granulocytes: 0 %
Lymphocytes Relative: 13 %
Lymphs Abs: 1 K/uL (ref 0.7–4.0)
MCH: 29.1 pg (ref 26.0–34.0)
MCHC: 31.8 g/dL (ref 30.0–36.0)
MCV: 91.6 fL (ref 80.0–100.0)
Monocytes Absolute: 0.1 K/uL (ref 0.1–1.0)
Monocytes Relative: 2 %
Neutro Abs: 6.1 K/uL (ref 1.7–7.7)
Neutrophils Relative %: 84 %
Platelets: 231 K/uL (ref 150–400)
RBC: 2.99 MIL/uL — ABNORMAL LOW (ref 4.22–5.81)
RDW: 16 % — ABNORMAL HIGH (ref 11.5–15.5)
WBC: 7.3 K/uL (ref 4.0–10.5)
nRBC: 0 % (ref 0.0–0.2)

## 2024-04-22 LAB — CG4 I-STAT (LACTIC ACID): Lactic Acid, Venous: 1.2 mmol/L (ref 0.5–1.9)

## 2024-04-22 LAB — RESP PANEL BY RT-PCR (RSV, FLU A&B, COVID)  RVPGX2
Influenza A by PCR: NEGATIVE
Influenza B by PCR: NEGATIVE
Resp Syncytial Virus by PCR: NEGATIVE
SARS Coronavirus 2 by RT PCR: NEGATIVE

## 2024-04-22 LAB — I-STAT CG4 LACTIC ACID, ED: Lactic Acid, Venous: 2 mmol/L (ref 0.5–1.9)

## 2024-04-22 LAB — GLUCOSE, CAPILLARY
Glucose-Capillary: 156 mg/dL — ABNORMAL HIGH (ref 70–99)
Glucose-Capillary: 140 mg/dL — ABNORMAL HIGH (ref 70–99)

## 2024-04-22 LAB — PROTIME-INR
INR: 1.1 (ref 0.8–1.2)
Prothrombin Time: 15.1 s (ref 11.4–15.2)

## 2024-04-22 MED ORDER — SODIUM CHLORIDE 0.9 % IV SOLN: INTRAVENOUS | Status: AC

## 2024-04-22 MED ORDER — BISACODYL 5 MG PO TBEC: 5.0000 mg | DELAYED_RELEASE_TABLET | Freq: Every day | ORAL | Status: DC | PRN

## 2024-04-22 MED ORDER — SODIUM CHLORIDE 0.9 % IV SOLN: 1.0000 g | INTRAVENOUS | Status: DC

## 2024-04-22 MED ORDER — FINASTERIDE 5 MG PO TABS
5.0000 mg | ORAL_TABLET | Freq: Every day | ORAL | Status: DC
Start: 2024-04-22 — End: 2024-04-24
  Administered 2024-04-22 – 2024-04-24 (×3): 5 mg via ORAL
  Filled 2024-04-22 (×3): qty 1

## 2024-04-22 MED ORDER — ONDANSETRON HCL 4 MG PO TABS: 4.0000 mg | ORAL_TABLET | Freq: Four times a day (QID) | ORAL | Status: DC | PRN

## 2024-04-22 MED ORDER — OMEGA-3-ACID ETHYL ESTERS 1 G PO CAPS
1.0000 g | ORAL_CAPSULE | Freq: Every day | ORAL | Status: DC
  Administered 2024-04-23 – 2024-04-24 (×2): 1 g via ORAL
  Filled 2024-04-22 (×2): qty 1

## 2024-04-22 MED ORDER — ACETAMINOPHEN 650 MG RE SUPP: 650.0000 mg | Freq: Four times a day (QID) | RECTAL | Status: DC | PRN

## 2024-04-22 MED ORDER — INSULIN ASPART 100 UNIT/ML IJ SOLN
0.0000 [IU] | Freq: Three times a day (TID) | INTRAMUSCULAR | Status: DC
  Administered 2024-04-22: 2 [IU] via SUBCUTANEOUS
  Administered 2024-04-22: 3 [IU] via SUBCUTANEOUS
  Administered 2024-04-23: 8 [IU] via SUBCUTANEOUS
  Administered 2024-04-23: 2 [IU] via SUBCUTANEOUS

## 2024-04-22 MED ORDER — GABAPENTIN 300 MG PO CAPS
300.0000 mg | ORAL_CAPSULE | Freq: Every day | ORAL | Status: DC
  Administered 2024-04-22 – 2024-04-23 (×2): 300 mg via ORAL
  Filled 2024-04-22 (×2): qty 1

## 2024-04-22 MED ORDER — SODIUM CHLORIDE 0.9 % IV SOLN: INTRAVENOUS | Status: DC

## 2024-04-22 MED ORDER — ENOXAPARIN SODIUM 30 MG/0.3ML IJ SOSY
30.0000 mg | PREFILLED_SYRINGE | INTRAMUSCULAR | Status: DC
  Administered 2024-04-23 – 2024-04-24 (×2): 30 mg via SUBCUTANEOUS
  Filled 2024-04-22 (×2): qty 0.3

## 2024-04-22 MED ORDER — FERROUS SULFATE 325 (65 FE) MG PO TABS
325.0000 mg | ORAL_TABLET | ORAL | Status: DC
  Administered 2024-04-22: 325 mg via ORAL
  Filled 2024-04-22: qty 1

## 2024-04-22 MED ORDER — ASPIRIN 81 MG PO TBEC
81.0000 mg | DELAYED_RELEASE_TABLET | Freq: Every day | ORAL | Status: DC
  Administered 2024-04-23 – 2024-04-24 (×2): 81 mg via ORAL
  Filled 2024-04-22 (×2): qty 1

## 2024-04-22 MED ORDER — ACETAMINOPHEN 500 MG PO TABS
1000.0000 mg | ORAL_TABLET | ORAL | Status: AC
  Administered 2024-04-22: 1000 mg via ORAL
  Filled 2024-04-22: qty 2

## 2024-04-22 MED ORDER — TAMSULOSIN HCL 0.4 MG PO CAPS
0.4000 mg | ORAL_CAPSULE | Freq: Every day | ORAL | Status: DC
  Administered 2024-04-22 – 2024-04-24 (×3): 0.4 mg via ORAL
  Filled 2024-04-22 (×3): qty 1

## 2024-04-22 MED ORDER — SENNOSIDES-DOCUSATE SODIUM 8.6-50 MG PO TABS: 1.0000 | ORAL_TABLET | Freq: Every evening | ORAL | Status: DC | PRN

## 2024-04-22 MED ORDER — PREDNISONE 5 MG PO TABS
5.0000 mg | ORAL_TABLET | Freq: Every day | ORAL | Status: DC
  Administered 2024-04-23 – 2024-04-24 (×2): 5 mg via ORAL
  Filled 2024-04-22 (×2): qty 1

## 2024-04-22 MED ORDER — QUETIAPINE FUMARATE 25 MG PO TABS
25.0000 mg | ORAL_TABLET | Freq: Every evening | ORAL | Status: DC
  Administered 2024-04-22 – 2024-04-23 (×2): 25 mg via ORAL
  Filled 2024-04-22 (×2): qty 1

## 2024-04-22 MED ORDER — DOCUSATE SODIUM 100 MG PO CAPS
100.0000 mg | ORAL_CAPSULE | Freq: Every day | ORAL | Status: DC
  Administered 2024-04-23 – 2024-04-24 (×2): 100 mg via ORAL
  Filled 2024-04-22 (×2): qty 1

## 2024-04-22 MED ORDER — ONDANSETRON HCL 4 MG/2ML IJ SOLN: 4.0000 mg | Freq: Four times a day (QID) | INTRAMUSCULAR | Status: DC | PRN

## 2024-04-22 MED ORDER — LACTATED RINGERS IV BOLUS (SEPSIS)
1000.0000 mL | Freq: Once | INTRAVENOUS | Status: AC
  Administered 2024-04-22: 1000 mL via INTRAVENOUS

## 2024-04-22 MED ORDER — INSULIN GLARGINE 100 UNIT/ML ~~LOC~~ SOLN
5.0000 [IU] | Freq: Every day | SUBCUTANEOUS | Status: DC
  Administered 2024-04-22 – 2024-04-24 (×3): 5 [IU] via SUBCUTANEOUS
  Filled 2024-04-22 (×3): qty 0.05

## 2024-04-22 MED ORDER — PANTOPRAZOLE SODIUM 40 MG PO TBEC
40.0000 mg | DELAYED_RELEASE_TABLET | Freq: Every day | ORAL | Status: DC
  Administered 2024-04-22 – 2024-04-24 (×3): 40 mg via ORAL
  Filled 2024-04-22 (×3): qty 1

## 2024-04-22 MED ORDER — SODIUM CHLORIDE 0.9 % IV SOLN
2.0000 g | Freq: Once | INTRAVENOUS | Status: AC
  Administered 2024-04-22: 2 g via INTRAVENOUS
  Filled 2024-04-22: qty 20

## 2024-04-22 MED ORDER — ACETAMINOPHEN 325 MG PO TABS: 650.0000 mg | ORAL_TABLET | Freq: Four times a day (QID) | ORAL | Status: DC | PRN

## 2024-04-22 NOTE — Progress Notes (Signed)
 Due to patient having dementia nurse is unable to run fluid bag. Patients experiences sundowners. Patient agitation has increased  but settled after administration of  Seroquel .

## 2024-04-22 NOTE — ED Triage Notes (Signed)
 Pt has been having chills since last pm.  Family member states that his temp was 100.1 and she feels he might have a UTI

## 2024-04-22 NOTE — TOC Initial Note (Signed)
 Transition of Care [facility]) - Initial/Assessment Note    Patient Details  Name: Brian [name] MRN: [id number] Date of Birth: [birth year]  Transition of [facility]) CM/SW Contact:    Lendia Dais, LCSWA Phone Number: 04/22/2024, 1:28 PM  Clinical Narrative:   Pt is from home with wife [name] and is independent with ADL's, has a walker and cane (use as needed), and has a home health aide through [facility].   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 Pt is able to afford all their basic needs. 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   CSW will follow for ICM needs.          Expected Discharge Plan: Home/Self Care Barriers to Discharge: Continued Medical Work up   Patient Goals and CMS Choice            Expected Discharge Plan and Services In-house Referral: Clinical Social Work     Living arrangements for the past 2 months: Single Family Home                                      Prior Living Arrangements/Services Living arrangements for the past 2 months: Single Family Home Lives with:: Spouse Patient language and need for interpreter reviewed:: Yes Do you feel safe going back to the place where you live?: Yes      Need for Family Participation in Patient Care: Yes (Comment) Care giver support system in place?: Yes (comment) Current home services: DME, Homehealth aide ([facility]) Criminal Activity/Legal Involvement Pertinent to Current Situation/Hospitalization: No - Comment as needed  Activities of Daily Living   ADL Screening (condition at time of admission) Is the patient deaf or have difficulty hearing?: No Does the patient have difficulty seeing, even when wearing glasses/contacts?: No Does the patient have difficulty concentrating, remembering, or making decisions?: Yes  Permission Sought/Granted Permission sought to share information with : Family Supports Permission granted to share information  with : Yes, Verbal Permission Granted  Share Information with NAME: Boby Collet     Permission granted to share info w Relationship: Spouse  Permission granted to share info w Contact Information: [phone]  Emotional Assessment Appearance:: Appears younger than stated age   Affect (typically observed): Pleasant Orientation: : Oriented to Situation, Oriented to Self, Oriented to Place, Oriented to  Time Alcohol  / Substance Use: Not Applicable Psych Involvement: No (comment)  Admission diagnosis:  Sepsis secondary to UTI (HCC) [A41.9, N39.0] Patient Active Problem List   Diagnosis Date Noted   Sepsis secondary to UTI (HCC) 04/22/2024   Syncope 10/01/2023   Loose stools 10/01/2023   Chronic diastolic CHF (congestive heart failure) (HCC) 10/01/2023   Syncope and collapse 03/19/2023   Acute blood loss anemia 03/19/2023   GI bleed 03/19/2023   Dementia without behavioral disturbance (HCC) 03/19/2023   S/P TAVR (transcatheter aortic valve replacement) 01/31/2020   DM2 (diabetes mellitus, type 2) (HCC) 12/29/2019   Malnutrition (HCC) 12/29/2019   Anemia of chronic disease 11/23/2019   Erectile dysfunction 09/10/2017   Diastolic dysfunction without heart failure 06/08/2017   Essential hypertension 05/08/2015   Hyperlipidemia LDL goal <100 05/08/2015   Diverticulosis of colon 05/08/2015   Secondary hyperparathyroidism of renal origin (HCC) 05/08/2015   CKD stage 3b, GFR 30-44 ml/min (HCC) 05/08/2015   Irritable bowel syndrome with constipation 05/08/2015   BPH (benign  prostatic hyperplasia) 05/08/2015   Type 2 diabetes, controlled, with renal manifestation (HCC) 03/28/2015   PCP:  Millicent Redell POUR, MD Pharmacy:   [facility] [address] [place] - [address] [place] [address] Phone: [phone] Fax: [phone]  [facility] [address] [place] [address] Phone: [phone] Fax:  [phone]     Social Drivers of Health (SDOH) Social History: SDOH Screenings   Food Insecurity: No Food Insecurity (03/19/2023)  Housing: Unknown (09/29/2023)   Received from [facility]  Transportation Needs: No Transportation Needs (03/19/2023)  Utilities: Not At Risk (03/19/2023)  Alcohol  Screen: Low Risk  (12/29/2019)  Depression (PHQ2-9): Low Risk  (09/18/2020)  Financial Resource Strain: Low Risk  (09/18/2020)  Physical Activity: Inactive (09/18/2020)  Social Connections: Moderately Integrated (09/18/2020)  Stress: No Stress Concern Present (09/18/2020)  Tobacco Use: Medium Risk (04/22/2024)   SDOH Interventions:     Readmission Risk Interventions     No data to display

## 2024-04-22 NOTE — Sepsis Progress Note (Signed)
 Code Sepsis protocol being monitored by eLink.

## 2024-04-22 NOTE — ED Provider Notes (Signed)
 [facility] EMERGENCY DEPARTMENT AT [facility] Provider Note   CSN: [id number] Arrival date & time: 04/22/24  9244     Patient presents with: Fever   Brian [name] is a 89 y.o. male.   89 year old male with a history of Alzheimer's dementia, aortic stenosis status post TAVR, CHF, hypertension, and diabetes who presents emergency department with fever and difficulty urinating.  History obtained from the patient and his wife.  Last night started having some chills.  Also had some trouble urinating.  Reports that he had a temperature of 100.1 F at home.  They are concerned about a UTI for him.  No runny nose, sore throat, cough, nausea, vomiting, or diarrhea.  Denies pain anywhere       Prior to Admission medications   Medication Sig Start Date End Date Taking? Authorizing Provider  acetaminophen  (TYLENOL ) 650 MG CR tablet Take 1,300 mg by mouth daily.   Yes [provider]  aspirin  EC 81 MG tablet Take 81 mg by mouth daily.    Yes [provider]  diclofenac Sodium (VOLTAREN) 1 % GEL Apply 1 Application topically 2 (two) times daily as needed (pain).   Yes [provider]  docusate sodium  (DOK) 100 MG capsule Take 1 capsule (100 mg total) by mouth 2 (two) times daily as needed. Patient taking differently: Take 100 mg by mouth daily. 10/27/17  Yes [name], Newell SQUIBB, MD  ferrous sulfate  325 (65 FE) MG tablet Take 1 tablet (325 mg total) by mouth every other day. Patient taking differently: Take 325 mg by mouth every Monday, Wednesday, and Friday. 03/24/23 06/04/24 Yes Perri DELENA Meliton Mickey., MD  finasteride  (PROSCAR ) 5 MG tablet Take 1 tablet (5 mg total) by mouth daily. 06/30/19  Yes Tapia, Leisa, PA-C  gabapentin  (NEURONTIN ) 300 MG capsule Take 300 mg by mouth at bedtime.   Yes [provider]  insulin  glargine-yfgn (SEMGLEE , YFGN,) 100 UNIT/ML Pen Inject 5 Units into the skin daily.   Yes [provider]  Omega-3 Fatty Acids (FISH  OIL PO) Take 1 capsule by mouth daily.   Yes [provider]  pantoprazole  (PROTONIX ) 40 MG tablet Take 1 tablet (40 mg total) by mouth 2 (two) times daily. After completing 2 months, take 40 mg daily. Patient taking differently: Take 40 mg by mouth daily. 03/24/23 09/30/24 Yes Perri DELENA Meliton Mickey., MD  predniSONE  (DELTASONE ) 5 MG tablet Take 5 mg by mouth daily.   Yes [provider]  QUEtiapine  (SEROQUEL ) 25 MG tablet Take 25 mg by mouth every evening. 09/22/23  Yes [provider]  QUEtiapine  (SEROQUEL ) 50 MG tablet Take 50 mg by mouth at bedtime as needed (agitation).   Yes [provider]  tamsulosin  (FLOMAX ) 0.4 MG CAPS capsule TAKE 1 CAPSULE BY MOUTH EVERY DAY 11/03/19  Yes Tapia, Leisa, PA-C  ACCU-CHEK GUIDE test strip USE UP TO 4 TIMES A DAY AS DIRECTED 03/06/19   Poulose, Almarie BRAVO, NP  Insulin  Pen Needle (B-D UF III MINI PEN NEEDLES) 31G X 5 MM MISC USE AS DIRECTED WITH LEVEMIR  INSULIN  PENS AT BEDTIME, once a day 10/20/18   [name], Newell SQUIBB, MD  lisinopril  (ZESTRIL ) 40 MG tablet Take 1 tablet (40 mg total) by mouth daily. Home med. Patient not taking: Reported on 04/22/2024 10/01/23   Awanda City, MD  metoprolol  succinate (TOPROL -XL) 25 MG 24 hr tablet Take 37.5 mg by mouth daily. Patient not taking: Reported on 04/22/2024    [provider]  Allergies: Coreg  [carvedilol ], Niaspan [niacin er (antihyperlipidemic)], Norvasc  [amlodipine ], Shellfish allergy, Statins, Sulfa antibiotics, Zetia [ezetimibe], and Januvia  [sitagliptin ]    Review of Systems  Updated Vital Signs BP (!) 110/99 (BP Location: Right Arm)   Pulse 87   Temp 98.4 F (36.9 C) (Oral)   Resp 18   Ht 5' 9 (1.753 m)   Wt 62 kg   SpO2 99%   BMI 20.18 kg/m   Physical Exam Vitals and nursing note reviewed.  Constitutional:      General: He is not in acute distress.    Appearance: He is well-developed.  HENT:     Head: Normocephalic and atraumatic.     Right Ear: External  ear normal.     Left Ear: External ear normal.     Nose: Nose normal.  Eyes:     Extraocular Movements: Extraocular movements intact.     Conjunctiva/sclera: Conjunctivae normal.     Pupils: Pupils are equal, round, and reactive to light.  Cardiovascular:     Rate and Rhythm: Regular rhythm. Tachycardia present.     Heart sounds: Normal heart sounds.  Pulmonary:     Effort: Pulmonary effort is normal. No respiratory distress.     Breath sounds: Normal breath sounds.  Abdominal:     General: There is no distension.     Palpations: Abdomen is soft. There is no mass.     Tenderness: There is no abdominal tenderness. There is no right CVA tenderness, left CVA tenderness or guarding.  Musculoskeletal:     Cervical back: Normal range of motion and neck supple.     Right lower leg: No edema.     Left lower leg: No edema.  Skin:    General: Skin is warm and dry.  Neurological:     Mental Status: He is alert. Mental status is at baseline.     Comments: Alert and oriented to self.  Knew he was in the hospital.  Did not know the year  Psychiatric:        Mood and Affect: Mood normal.        Behavior: Behavior normal.     (all labs ordered are listed, but only abnormal results are displayed) Labs Reviewed  BLOOD CULTURE ID PANEL (REFLEXED) - BCID2 - Abnormal; Notable for the following components:      Result Value   Enterobacterales DETECTED (*)    Escherichia coli DETECTED (*)    All other components within normal limits  COMPREHENSIVE METABOLIC PANEL WITH GFR - Abnormal; Notable for the following components:   Glucose, Bld 226 (*)    Creatinine, Ser 1.64 (*)    Albumin  3.4 (*)    GFR, Estimated 40 (*)    All other components within normal limits  CBC WITH DIFFERENTIAL/PLATELET - Abnormal; Notable for the following components:   RBC 2.99 (*)    Hemoglobin 8.7 (*)    HCT 27.4 (*)    RDW 16.0 (*)    All other components within normal limits  URINALYSIS, W/ REFLEX TO CULTURE  (INFECTION SUSPECTED) - Abnormal; Notable for the following components:   APPearance CLOUDY (*)    Glucose, UA 150 (*)    Hgb urine dipstick MODERATE (*)    Protein, ur 100 (*)    Leukocytes,Ua LARGE (*)    Bacteria, UA MANY (*)    All other components within normal limits  HEMOGLOBIN A1C - Abnormal; Notable for the following components:   Hgb A1c MFr Bld 8.4 (*)  All other components within normal limits  GLUCOSE, CAPILLARY - Abnormal; Notable for the following components:   Glucose-Capillary 156 (*)    All other components within normal limits  GLUCOSE, CAPILLARY - Abnormal; Notable for the following components:   Glucose-Capillary 140 (*)    All other components within normal limits  I-STAT CG4 LACTIC ACID, ED - Abnormal; Notable for the following components:   Lactic Acid, Venous 2.0 (*)    All other components within normal limits  CULTURE, BLOOD (ROUTINE X 2)  CULTURE, BLOOD (ROUTINE X 2)  RESP PANEL BY RT-PCR (RSV, FLU A&B, COVID)  RVPGX2  URINE CULTURE  PROTIME-INR  BASIC METABOLIC PANEL WITH GFR  CBC  I-STAT CG4 LACTIC ACID, ED  CG4 I-STAT (LACTIC ACID)    EKG: EKG Interpretation Date/Time:  Friday April 22 2024 08:48:16 EDT Ventricular Rate:  120 PR Interval:  165 QRS Duration:  78 QT Interval:  307 QTC Calculation: 434 R Axis:   -42  Text Interpretation: Sinus tachycardia Left axis deviation Abnormal R-wave progression, late transition Confirmed by Yolande Charleston [phone]) on 04/22/2024 8:49:47 AM  Radiology: [facility] Chest Port 1 View Result Date: 04/22/2024 CLINICAL DATA:  Questionable sepsis - evaluate for abnormality. EXAM: PORTABLE CHEST 1 VIEW COMPARISON:  11/14/2023. FINDINGS: Bilateral lung fields are clear. Bilateral costophrenic angles are clear. Normal cardio-mediastinal silhouette. Prosthetic aortic valve noted. No acute osseous abnormalities. The soft tissues are within normal limits. IMPRESSION: No active disease. Electronically Signed   By: Ree Molt M.D.   On: 04/22/2024 08:41     Procedures   Medications Ordered in the ED  insulin  aspart (novoLOG ) injection 0-15 Units (2 Units Subcutaneous Given 04/22/24 1818)  acetaminophen  (TYLENOL ) tablet 650 mg (has no administration in time range)    Or  acetaminophen  (TYLENOL ) suppository 650 mg (has no administration in time range)  senna-docusate (Senokot-S) tablet 1 tablet (has no administration in time range)  bisacodyl  (DULCOLAX) EC tablet 5 mg (has no administration in time range)  ondansetron  (ZOFRAN ) tablet 4 mg (has no administration in time range)    Or  ondansetron  (ZOFRAN ) injection 4 mg (has no administration in time range)  enoxaparin  (LOVENOX ) injection 30 mg (has no administration in time range)  0.9 %  sodium chloride  infusion ( Intravenous New Bag/Given 04/22/24 2221)  insulin  glargine (LANTUS ) injection 5 Units (5 Units Subcutaneous Given 04/22/24 1255)  aspirin  EC tablet 81 mg (has no administration in time range)  QUEtiapine  (SEROQUEL ) tablet 25 mg (25 mg Oral Not Given 04/22/24 1904)  docusate sodium  (COLACE) capsule 100 mg (has no administration in time range)  pantoprazole  (PROTONIX ) EC tablet 40 mg (40 mg Oral Given 04/22/24 1559)  finasteride  (PROSCAR ) tablet 5 mg (5 mg Oral Given 04/22/24 1559)  tamsulosin  (FLOMAX ) capsule 0.4 mg (0.4 mg Oral Given 04/22/24 1559)  ferrous sulfate  tablet 325 mg (325 mg Oral Given 04/22/24 1559)  gabapentin  (NEURONTIN ) capsule 300 mg (300 mg Oral Given 04/22/24 2237)  omega-3 acid ethyl esters (LOVAZA ) capsule 1 g (has no administration in time range)  predniSONE  (DELTASONE ) tablet 5 mg (has no administration in time range)  cefTRIAXone  (ROCEPHIN ) 2 g in sodium chloride  0.9 % 100 mL IVPB (has no administration in time range)  lactated ringers  bolus 1,000 mL (0 mLs Intravenous Stopped 04/22/24 0948)  cefTRIAXone  (ROCEPHIN ) 2 g in sodium chloride  0.9 % 100 mL IVPB (0 g Intravenous Stopped 04/22/24 0913)  acetaminophen  (TYLENOL ) tablet  1,000 mg (1,000 mg Oral Given 04/22/24 0849)  Clinical Course as of 04/23/24 9373  Fri Apr 22, 2024  0848 Hemoglobin(!): 8.7 Baseline of 9 [RP]  [phone] Dr Lou from hospitalist consulted for admission [RP]    Clinical Course User Index [RP] Yolande Lamar BROCKS, MD                                 Medical Decision Making Amount and/or Complexity of Data Reviewed Labs: ordered. Decision-making details documented in ED Course. Radiology: ordered.  Risk OTC drugs. Decision regarding hospitalization.   DIMITRIS SHANAHAN is a 89 year old male with a history of Alzheimer's dementia, aortic stenosis status post TAVR, CHF, hypertension, and diabetes who presents emergency department with fever and difficulty urinating.  Initial Ddx:  Uti, sepsis, septic shock, uri, pneumonia   MDM/Course:  Patient presents emergency department with difficulty urinating and fever.  On arrival is febrile and tachycardic.  Blood pressure is normal.  Sepsis protocol initiated.  Not in septic shock currently.  Was given ceftriaxone  due to concern for urinary source.  Urinalysis was obtained which is consistent with UTI.  Did have a chest x-ray and COVID and flu that were negative.  Admitted to the hospital for further management.  Upon re-evaluation remained well-appearing.  His vital signs did improve somewhat after Tylenol  and fluids.  This patient presents to the ED for concern of complaints listed in HPI, this involves an extensive number of treatment options, and is a complaint that carries with it a high risk of complications and morbidity. Disposition including potential need for admission considered.   Dispo: Admit to Floor  Additional history obtained from spouse Records reviewed Outpatient Clinic Notes The following labs were independently interpreted: Chemistry and show CKD I personally reviewed and interpreted cardiac monitoring: sinus tachycardia I personally reviewed and interpreted the pt's  EKG: see above for interpretation  I have reviewed the patients home medications and made adjustments as needed Consults: Hospitalist Social Determinants of health:  Geriatric  Portions of this note were generated with Scientist, clinical (histocompatibility and immunogenetics). Dictation errors may occur despite best attempts at proofreading.     Final diagnoses:  Acute cystitis with hematuria  Sepsis, due to unspecified organism, unspecified whether acute organ dysfunction present [facility])    ED Discharge Orders     None          Yolande Lamar BROCKS, MD 04/23/24 [phone]

## 2024-04-22 NOTE — ED Notes (Signed)
 65M notified of patient coming up.

## 2024-04-22 NOTE — Evaluation (Signed)
 Physical Therapy Evaluation Patient Details Name: Brian [name] MRN: [id number] DOB: September 03, 1933 Today's Date: 04/22/2024  History of Present Illness  89 y.o. male who presented to the ED 8/29 for evaluation of fever, chills and dysuria. Admitted for treatment of sepsis acute cystitis PMH: Alzheimer's dementia, severe aortic stenosis s/p TAVR, chronic diastolic HF, HTN, HLD, T2DM, CKD 3B, BPH, GERD and arthritis  Clinical Impression  Pt wide awake on entry, oriented to himself, and calls his wife a good friend despite her correcting him. 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 Wife is exhausted due to last 24 hours and is in and out of sleep. Wife stated that pt is independent with ambulation and ADLs and that she provides for iADLs. Per LCSW notes pt has HHAide. Pt is limited in safe mobility by decreased strength and balance in presence of decreased cognition especially safety awareness. Pt is mod I for bed mobility and contact guard for transfers and ambulation in hallway without AD. PT provided cues for safety with ambulation which are ignored by pt. PT will continue to follow acutely. Will discuss with wife further feasibility of HHPT at discharge.       If plan is discharge home, recommend the following: A little help with walking and/or transfers;A little help with bathing/dressing/bathroom;Assistance with cooking/housework;Direct supervision/assist for medications management;Direct supervision/assist for financial management;Assist for transportation;Help with stairs or ramp for entrance;Supervision due to cognitive status   Can travel by private vehicle    Yes    Equipment Recommendations None recommended by PT     Functional Status Assessment Patient has had a recent decline in their functional status and demonstrates the ability to make significant improvements in function in a reasonable and predictable amount of time.     Precautions / Restrictions  Precautions Precautions: Fall Restrictions Weight Bearing Restrictions Per Provider Order: No      Mobility  Bed Mobility Overal bed mobility: Modified Independent             General bed mobility comments: HoB elevated, pt able to get to EoB and back in bed with increased cuing but no physical assist required    Transfers Overall transfer level: Needs assistance   Transfers: Sit to/from Stand Sit to Stand: Contact guard assist           General transfer comment: good power up, contact guard for safety with steadying increased time and effort required    Ambulation/Gait Ambulation/Gait assistance: Contact guard assist Gait Distance (Feet): 80 Feet Assistive device: None Gait Pattern/deviations: Step-through pattern, Narrow base of support, Decreased step length - right, Decreased step length - left, Shuffle Gait velocity: slowed Gait velocity interpretation: <1.31 ft/sec, indicative of household ambulator   General Gait Details: contact guard for safety with ambulation in hallway, easily distracted increased cuing for staying on task. Pt with very narrow BoS, but no attempt to correct with cuing      Balance Overall balance assessment: Mild deficits observed, not formally tested                                           Pertinent Vitals/Pain Pain Assessment Pain Assessment: No/denies pain    Home Living Family/patient expects to be discharged to:: Private residence Living Arrangements: Alone Available Help at Discharge: Family;Available 24 hours/day Type of Home: House Home Access: Stairs to enter Entrance Stairs-Rails: Left  Entrance Stairs-Number of Steps: 4   Home Layout: Two level Home Equipment: Grab bars - tub/shower;Hand held shower head Additional Comments: wife in room assists with Home Set up and PLOF, however she is exhausted and drifts on and off to sleep    Prior Function Prior Level of Function : Independent/Modified  Independent             Mobility Comments: independent, has cane and RW and uses as needed ADLs Comments: independent for ADLs, wife assists with iADLs, pt has HHAide who assists     Extremity/Trunk Assessment   Upper Extremity Assessment Upper Extremity Assessment: Defer to OT evaluation    Lower Extremity Assessment Lower Extremity Assessment: Generalized weakness    Cervical / Trunk Assessment Cervical / Trunk Assessment: Normal  Communication   Communication Communication: Impaired Factors Affecting Communication: Difficulty expressing self    Cognition Arousal: Alert Behavior During Therapy: WFL for tasks assessed/performed   PT - Cognitive impairments: History of cognitive impairments, Orientation   Orientation impairments: Place, Time, Situation                   PT - Cognition Comments: history of Alzheimer's, repeats that he is a [employer]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[employer]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 Following commands: Health visitor: Verbal cues, Gestural cues, Tactile cues     General Comments General comments (skin integrity, edema, etc.): VSS on RA        Assessment/Plan    PT Assessment Patient needs continued PT services  PT Problem List Decreased safety awareness;Decreased strength;Decreased balance;Decreased cognition       PT Treatment Interventions DME instruction;Gait training;Stair training;Functional mobility training;Therapeutic activities;Therapeutic exercise;Balance training;Cognitive remediation;Patient/family education    PT Goals (Current goals can be found in the Care Plan section)  Acute Rehab PT Goals PT Goal Formulation: With patient/family Time For Goal Achievement: 05/06/24 Potential to Achieve Goals: Fair    Frequency Min 2X/week        AM-PAC PT 6 Clicks Mobility  Outcome Measure Help needed turning from your back to your side while in a flat bed without using bedrails?: None Help needed moving from lying on  your back to sitting on the side of a flat bed without using bedrails?: None Help needed moving to and from a bed to a chair (including a wheelchair)?: None Help needed standing up from a chair using your arms (e.g., wheelchair or bedside chair)?: A Little Help needed to walk in hospital room?: A Little Help needed climbing 3-5 steps with a railing? : A Lot 6 Click Score: 20    End of Session Equipment Utilized During Treatment: Gait belt Activity Tolerance: Patient tolerated treatment well Patient left: in bed;with call bell/phone within reach;with bed alarm set;with family/visitor present Nurse Communication: Mobility status PT Visit Diagnosis: Unsteadiness on feet (R26.81);Muscle weakness (generalized) (M62.81);Difficulty in walking, not elsewhere classified (R26.2)    Time: 8647-8591 PT Time Calculation (min) (ACUTE ONLY): 16 min   Charges:   PT Evaluation $PT Eval Low Complexity: 1 Low   PT General Charges $$ ACUTE PT VISIT: 1 Visit         [place] Vandergriff B. Fleeta Lapidus PT, DPT Acute Rehabilitation Services Please use secure chat or  Call Office [phone]   Almarie KATHEE Fleeta [facility] 04/22/2024, 2:34 PM

## 2024-04-22 NOTE — H&P (Signed)
 History and Physical  Brian [name]:[id number] DOB: 12/29/1933 DOA: 04/22/2024  PCP: Millicent Redell POUR, MD   Chief Complaint: Fever, chills, dysuria  HPI: Brian [name] is a 89 y.o. male with medical history significant for Alzheimer's dementia, severe aortic stenosis s/p TAVR, chronic diastolic HF, HTN, HLD, T2DM, CKD 3B, BPH, GERD and arthritis who presented to the ED for evaluation of fever, chills and dysuria. Per spouse, patient started having chills last night and had a fever of 101. He also had some trouble urinating and they were concerned about possible UTI so they brought him to the ED for further evaluation.  Patient with very short-term memory but denies any abdominal pain, nausea, vomiting, chest pain or shortness of breath.  ED Course: Initial vitals show temp to 102.7, RR 24, HR 135, BP 127/68, SpO2 97% on room air. Initial labs significant for glucose 226, creatinine 1.64, WBC 7.3, Hgb 8.7, lactic acid 2.0, UA shows moderate hemoglobinuria, negative nitrite, large leuks, RBC 21-50, WBC>50 and many bacteria. EKG shows sinus tach. CXR shows no active disease. Pt received Tylenol  1 g, IV LR 1 L bolus and IV Rocephin .  TRH was consulted for admission.   Review of Systems: Please see HPI for pertinent positives and negatives. A complete 10 system review of systems are otherwise negative.  Past Medical History:  Diagnosis Date   Anemia of chronic renal failure 05/08/2015   Aortic stenosis, severe 09/21/2015   Arthritis    Bladder stone    Chronic kidney disease (CKD), stage III (moderate) (HCC) 05/08/2015   Followed by Dr. Jaclynn    Congestive heart failure (HCC) 03/14/2019   Dementia (HCC)    patient will wander off if wife is not present   Diastolic dysfunction without heart failure 06/08/2017   Essential hypertension 05/08/2015   History of bladder stone    Hyperlipidemia    Hypertension    Nocturia    Renal disorder    S/P TAVR (transcatheter aortic valve  replacement) 01/31/2020   s/p TAVR with an Edwards 26 mm S3U via the TF approach by Dr. Sampson and Dusty   Type 2 diabetes mellitus [facility])    Past Surgical History:  Procedure Laterality Date   BIOPSY  03/21/2023   Procedure: BIOPSY;  Surgeon: Rollin Dover, MD;  Location: [facility] ENDOSCOPY;  Service: Gastroenterology;;   COLONOSCOPY     CYSTOSCOPY/RETROGRADE/URETEROSCOPY/STONE EXTRACTION WITH BASKET N/A 03/22/2013   Procedure: CYSTOSCOPY BLADDER STONE STONE EXTRACTION;  Surgeon: Thomasine Oiler, MD;  Location: [facility] [place];  Service: Urology;  Laterality: N/A;  CYSTOSCOPY     ESOPHAGOGASTRODUODENOSCOPY N/A 03/21/2023   Procedure: ESOPHAGOGASTRODUODENOSCOPY (EGD);  Surgeon: Rollin Dover, MD;  Location: [facility] ENDOSCOPY;  Service: Gastroenterology;  Laterality: N/A;   HEMOSTASIS CLIP PLACEMENT  03/21/2023   Procedure: HEMOSTASIS CLIP PLACEMENT;  Surgeon: Rollin Dover, MD;  Location: [facility] ENDOSCOPY;  Service: Gastroenterology;;   HOT HEMOSTASIS N/A 03/21/2023   Procedure: HOT HEMOSTASIS (ARGON PLASMA COAGULATION/BICAP);  Surgeon: Rollin Dover, MD;  Location: [facility] ENDOSCOPY;  Service: Gastroenterology;  Laterality: N/A;   INTRAOPERATIVE TRANSTHORACIC ECHOCARDIOGRAM N/A 01/31/2020   Procedure: INTRAOPERATIVE TRANSTHORACIC ECHOCARDIOGRAM;  Surgeon: Verlin Lonni BIRCH, MD;  Location: [facility] OR;  Service: Open Heart Surgery;  Laterality: N/A;   RIGHT/LEFT HEART CATH AND CORONARY ANGIOGRAPHY N/A 01/12/2020   Procedure: RIGHT/LEFT HEART CATH AND CORONARY ANGIOGRAPHY;  Surgeon: Verlin Lonni BIRCH, MD;  Location: [facility] INVASIVE CV LAB;  Service: Cardiovascular;  Laterality: N/A;   TONSILLECTOMY     TRANSCATHETER AORTIC  VALVE REPLACEMENT, TRANSFEMORAL N/A 01/31/2020   Procedure: TRANSCATHETER AORTIC VALVE REPLACEMENT, TRANSFEMORAL;  Surgeon: Verlin Lonni BIRCH, MD;  Location: [facility] OR;  Service: Open Heart Surgery;  Laterality: N/A;   Social History:  reports that he quit smoking about 31 years ago. His  smoking use included cigarettes. He started smoking about 70 years ago. He has a 29.3 pack-year smoking history. He has never used smokeless tobacco. He reports that he does not drink alcohol  and does not use drugs.  Allergies  Allergen Reactions   Amlodipine     Carvedilol  Other (See Comments)    bradycardia   Niaspan [Niacin Er (Antihyperlipidemic)]    Shellfish Allergy Other (See Comments)    Pt vomits   Statins Other (See Comments)    Myalgia   Sulfa Antibiotics    Zetia [Ezetimibe]    Januvia  [Sitagliptin ] Rash    Family History  Problem Relation Age of Onset   Hyperlipidemia Father    Congestive Heart Failure Other    Heart attack Other    Heart disease Other    Diabetes Brother      Prior to Admission medications   Medication Sig Start Date End Date Taking? Authorizing Provider  ACCU-CHEK GUIDE test strip USE UP TO 4 TIMES A DAY AS DIRECTED 03/06/19   Poulose, Almarie BRAVO, NP  Accu-Chek Softclix Lancets lancets  09/01/19   [provider]  aspirin  EC 81 MG tablet Take 81 mg by mouth daily.     [provider]  docusate sodium  (DOK) 100 MG capsule Take 1 capsule (100 mg total) by mouth 2 (two) times daily as needed. Patient taking differently: Take 100 mg by mouth 2 (two) times daily as needed for mild constipation. 10/27/17   [name], Newell SQUIBB, MD  ferrous sulfate  325 (65 FE) MG tablet Take 1 tablet (325 mg total) by mouth every other day. Patient taking differently: Take 325 mg by mouth every Monday, Wednesday, and Friday. 03/24/23 10/10/23  Perri DELENA Meliton Mickey., MD  finasteride  (PROSCAR ) 5 MG tablet Take 1 tablet (5 mg total) by mouth daily. 06/30/19   Tapia, Leisa, PA-C  gabapentin  (NEURONTIN ) 100 MG capsule Take 100 mg by mouth at bedtime.    [provider]  insulin  glargine (LANTUS ) 100 UNIT/ML injection Inject 5 Units into the skin daily.    [provider]  Insulin  Pen Needle (B-D UF III MINI PEN NEEDLES) 31G X 5 MM MISC USE AS DIRECTED  WITH LEVEMIR  INSULIN  PENS AT BEDTIME, once a day 10/20/18   [name], Newell SQUIBB, MD  lisinopril  (ZESTRIL ) 40 MG tablet Take 1 tablet (40 mg total) by mouth daily. Home med. 10/01/23   Awanda [place], MD  Omega-3 Fatty Acids (FISH OIL ) 1000 MG CAPS Take 1 capsule (1,000 mg total) by mouth daily. 12/04/17   Raford Riggs, MD  pantoprazole  (PROTONIX ) 40 MG tablet Take 1 tablet (40 mg total) by mouth 2 (two) times daily. After completing 2 months, take 40 mg daily. 03/24/23 09/30/24  Perri DELENA Meliton Mickey., MD  QUEtiapine  (SEROQUEL ) 25 MG tablet Take 25 mg by mouth at bedtime as needed (for agitation). 09/22/23   [provider]  tamsulosin  (FLOMAX ) 0.4 MG CAPS capsule TAKE 1 CAPSULE BY MOUTH EVERY DAY Patient taking differently: Take 0.4 mg by mouth daily. 11/03/19   Leavy Mole, PA-C    Physical Exam: BP 96/63 (BP Location: Left Arm)   Pulse 95   Temp 100.1 F (37.8 C) (Oral)   Resp 18  Ht 5' 9 (1.753 m)   Wt 62 kg   SpO2 100%   BMI 20.18 kg/m  General: Pleasant, well-appearing elderly man laying in bed. No acute distress. HEENT: [place]/AT. Anicteric sclera CV: Tachycardia. Regular rhythm. No murmurs, rubs, or gallops. No LE edema Pulmonary: Lungs CTAB. Normal effort. No wheezing or rales. Abdominal: Soft, nontender, nondistended. Normal bowel sounds. Extremities: Palpable radial and DP pulses. Normal ROM. Skin: Warm and dry. No obvious rash or lesions. Neuro: Alert and oriented to self and person. Very short term memory with repetition of the same question multiple times. Moves all extremities. Normal sensation to light touch. No focal deficit. Psych: Normal mood and affect          Labs on Admission:  Basic Metabolic Panel: Recent Labs  Lab 04/22/24 0801  NA 139  K 4.1  CL 103  CO2 23  GLUCOSE 226*  BUN 20  CREATININE 1.64*  CALCIUM  9.2   Liver Function Tests: Recent Labs  Lab 04/22/24 0801  AST 22  ALT 15  ALKPHOS 55  BILITOT 1.0  PROT 6.6  ALBUMIN  3.4*   No  results for input(s): LIPASE, AMYLASE in the last 168 hours. No results for input(s): AMMONIA in the last 168 hours. CBC: Recent Labs  Lab 04/22/24 0801  WBC 7.3  NEUTROABS 6.1  HGB 8.7*  HCT 27.4*  MCV 91.6  PLT 231   Cardiac Enzymes: No results for input(s): CKTOTAL, CKMB, CKMBINDEX, TROPONINI in the last 168 hours. BNP (last 3 results) Recent Labs    10/01/23 0535  BNP 113.8*    ProBNP (last 3 results) No results for input(s): PROBNP in the last 8760 hours.  CBG: Recent Labs  Lab 04/22/24 1123  GLUCAP 156*    Radiological Exams on Admission: DG Chest Port 1 View Result Date: 04/22/2024 CLINICAL DATA:  Questionable sepsis - evaluate for abnormality. EXAM: PORTABLE CHEST 1 VIEW COMPARISON:  11/14/2023. FINDINGS: Bilateral lung fields are clear. Bilateral costophrenic angles are clear. Normal cardio-mediastinal silhouette. Prosthetic aortic valve noted. No acute osseous abnormalities. The soft tissues are within normal limits. IMPRESSION: No active disease. Electronically Signed   By: Ree Molt M.D.   On: 04/22/2024 08:41   Assessment/Plan Brian [name] is a 89 y.o. male with medical history significant for Alzheimer's dementia, severe aortic stenosis s/p TAVR, chronic diastolic HF, HTN, HLD, T2DM, CKD 3B, BPH, GERD and arthritis who presented to the ED for evaluation of fever, chills and dysuria and admitted for sepsis secondary to UTI.   # Sepsis # Acute cystitis - Presented with fever, chills and dysuria - Urinalysis shows signs of infection - Remains at his baseline mental status - Met sepsis criteria with fever, tachycardia, lactic acidosis and evidence of urinary infection - Continue IV Rocephin  - Start IV hydration with IV NS@100  cc /hr for 20 hours - Follow-up blood and urine cultures - Trend CBC, fever curve  # T2DM with hyperglycemia - No recent A1c on file, glucose elevated to 226 on CMP - Home regimen includes Lantus  5 units  daily - Start Lantus  5 units daily - SSI with meals, CBG monitoring - Carb modified diet - F/u repeat A1c  # CKD 3B - Creatinine of 1.64 not significantly different from recent baseline of 1.5-1.6 - Continue IV hydration as above - Trend renal function-avoid nephrotoxic agents  # HTN - BP stable with SBP in the 110s to 120s - Previously on lisinopril  and metoprolol , held in the outpatient due to persistent  hypotension - Continue to hold BP meds  # HFpEF # AS s/p TVAR - Last TTE on 01/2021 showed EF 70-75%, moderate LVH, G1DD and mild perivalvular leak of the aortic prosthesis - Patient slightly dry on exam  # Anemia - Hgb of 8.7 stable compared to baseline of 8-10 - Continue iron supplementation - Trend CBC  # Inflammatory arthritis - Follows [facility]ology at [facility] clinic - Per his most recent visit Did not tolerate methotrexate. He stopped Plaquenil due to GI upset. Unable to use leflunomide due to neuropathy. Start Prednisone  course of prednisone  and Continue Gabapentin  300 mg at bedtime  - Continue prednisone  and gabapentin   # BPH - Continue finasteride  and Flomax   # GERD - Continue PPI  # Alzheimer's dementia - Has very short term memory and often repeats himself. Ambulates without an assistive device but family assist with ADLs. Often sundowns per spouse. - Continue Seroquel  - Delirium precautions  # Generalized weakness - In the setting of acute illness - PT/OT eval and treat  DVT prophylaxis: Lovenox      Code Status: Full Code  Consults called: None  Family Communication: Discussed admission with spouse at bedside  Severity of Illness: The appropriate patient status for this patient is INPATIENT. Inpatient status is judged to be reasonable and necessary in order to provide the required intensity of service to ensure the patient's safety. The patient's presenting symptoms, physical exam findings, and initial radiographic and laboratory data in the  context of their chronic comorbidities is felt to place them at high risk for further clinical deterioration. Furthermore, it is not anticipated that the patient will be medically stable for discharge from the hospital within 2 midnights of admission.   * I certify that at the point of admission it is my clinical judgment that the patient will require inpatient hospital care spanning beyond 2 midnights from the point of admission due to high intensity of service, high risk for further deterioration and high frequency of surveillance required.*  Level of care: Telemetry Medical   This record has been created using Conservation officer, historic buildings. Errors have been sought and corrected, but may not always be located. Such creation errors do not reflect on the standard of care.   Lou Claretta HERO, MD 04/22/2024, 11:33 AM [facility] Pager: [phone] Isaiah 41:10   If 7PM-7AM, please contact night-coverage [email or web address] Password [email or web address]

## 2024-04-22 NOTE — ED Notes (Signed)
 Extra SST, Red, DG & Lav drawn

## 2024-04-22 NOTE — Progress Notes (Signed)
Arrived on unit.

## 2024-04-23 DIAGNOSIS — A419 Sepsis, unspecified organism: Secondary | ICD-10-CM | POA: Diagnosis not present

## 2024-04-23 DIAGNOSIS — N390 Urinary tract infection, site not specified: Secondary | ICD-10-CM | POA: Diagnosis not present

## 2024-04-23 LAB — BLOOD CULTURE ID PANEL (REFLEXED) - BCID2

## 2024-04-23 LAB — BASIC METABOLIC PANEL WITH GFR
Anion gap: 10 (ref 5–15)
BUN: 25 mg/dL — ABNORMAL HIGH (ref 8–23)
CO2: 22 mmol/L (ref 22–32)
Calcium: 8.5 mg/dL — ABNORMAL LOW (ref 8.9–10.3)
Chloride: 106 mmol/L (ref 98–111)
Creatinine, Ser: 1.46 mg/dL — ABNORMAL HIGH (ref 0.61–1.24)
GFR, Estimated: 46 mL/min — ABNORMAL LOW (ref >60)
Glucose, Bld: 113 mg/dL — ABNORMAL HIGH (ref 70–99)
Potassium: 3.8 mmol/L (ref 3.5–5.1)
Sodium: 138 mmol/L (ref 135–145)

## 2024-04-23 LAB — CBC
HCT: 24.1 % — ABNORMAL LOW (ref 39.0–52.0)
Hemoglobin: 7.6 g/dL — ABNORMAL LOW (ref 13.0–17.0)
MCH: 28.7 pg (ref 26.0–34.0)
MCHC: 31.5 g/dL (ref 30.0–36.0)
MCV: 90.9 fL (ref 80.0–100.0)
Platelets: 208 K/uL (ref 150–400)
RBC: 2.65 MIL/uL — ABNORMAL LOW (ref 4.22–5.81)
RDW: 15.9 % — ABNORMAL HIGH (ref 11.5–15.5)
WBC: 9 K/uL (ref 4.0–10.5)
nRBC: 0 % (ref 0.0–0.2)

## 2024-04-23 LAB — HEMOGLOBIN A1C
Hgb A1c MFr Bld: 8.4 % — ABNORMAL HIGH (ref 4.8–5.6)
Mean Plasma Glucose: 194 mg/dL

## 2024-04-23 LAB — GLUCOSE, CAPILLARY
Glucose-Capillary: 145 mg/dL — ABNORMAL HIGH (ref 70–99)
Glucose-Capillary: 99 mg/dL (ref 70–99)
Glucose-Capillary: 285 mg/dL — ABNORMAL HIGH (ref 70–99)
Glucose-Capillary: 141 mg/dL — ABNORMAL HIGH (ref 70–99)

## 2024-04-23 MED ORDER — HALOPERIDOL LACTATE 5 MG/ML IJ SOLN
2.0000 mg | Freq: Four times a day (QID) | INTRAMUSCULAR | Status: DC | PRN
  Administered 2024-04-23: 2 mg via INTRAVENOUS
  Filled 2024-04-23: qty 1

## 2024-04-23 MED ORDER — SODIUM CHLORIDE 0.9 % IV SOLN
2.0000 g | INTRAVENOUS | Status: DC
  Administered 2024-04-23 – 2024-04-24 (×2): 2 g via INTRAVENOUS
  Filled 2024-04-23 (×2): qty 20

## 2024-04-23 NOTE — Progress Notes (Signed)
 OT Cancellation Note  Patient Details Name: Brian [name] MRN: [id number] DOB: Jun 08, 1934   Cancelled Treatment:    Reason Eval/Treat Not Completed: Other (comment). Pt in middle of eating his lunch--will try back later today for evaluation.  Donny BECKER OT Acute Rehabilitation Services Office [phone]    [place] Dorothyann Distel 04/23/2024, 12:08 PM

## 2024-04-23 NOTE — Progress Notes (Signed)
 PHARMACY - PHYSICIAN COMMUNICATION CRITICAL VALUE ALERT - BLOOD CULTURE IDENTIFICATION (BCID)  Brian [name] is an 89 y.o. male who presented to [facility] on 04/22/2024 with a chief complaint of fevers/chills/dysuria  Assessment:  3/4 blood cultures with E. Coli w/ no R  Name of physician (or Provider) Contacted: Dr. Georgina  Current antibiotics: Ceftriaxone  1g IV every 24 hours  Changes to prescribed antibiotics recommended:   -Increase to 2g of ceftriaxone  IV every 24 hours   Results for orders placed or performed during the hospital encounter of 04/22/24  Blood Culture ID Panel (Reflexed) (Collected: 04/22/2024  8:06 AM)  Result Value Ref Range   Enterococcus faecalis NOT DETECTED NOT DETECTED   Enterococcus Faecium NOT DETECTED NOT DETECTED   Listeria monocytogenes NOT DETECTED NOT DETECTED   Staphylococcus species NOT DETECTED NOT DETECTED   Staphylococcus aureus (BCID) NOT DETECTED NOT DETECTED   Staphylococcus epidermidis NOT DETECTED NOT DETECTED   Staphylococcus lugdunensis NOT DETECTED NOT DETECTED   Streptococcus species NOT DETECTED NOT DETECTED   Streptococcus agalactiae NOT DETECTED NOT DETECTED   Streptococcus pneumoniae NOT DETECTED NOT DETECTED   Streptococcus pyogenes NOT DETECTED NOT DETECTED   A.calcoaceticus-baumannii NOT DETECTED NOT DETECTED   Bacteroides fragilis NOT DETECTED NOT DETECTED   Enterobacterales DETECTED (A) NOT DETECTED   Enterobacter cloacae complex NOT DETECTED NOT DETECTED   Escherichia coli DETECTED (A) NOT DETECTED   Klebsiella aerogenes NOT DETECTED NOT DETECTED   Klebsiella oxytoca NOT DETECTED NOT DETECTED   Klebsiella pneumoniae NOT DETECTED NOT DETECTED   Proteus species NOT DETECTED NOT DETECTED   Salmonella species NOT DETECTED NOT DETECTED   Serratia marcescens NOT DETECTED NOT DETECTED   Haemophilus influenzae NOT DETECTED NOT DETECTED   Neisseria meningitidis NOT DETECTED NOT DETECTED   Pseudomonas aeruginosa NOT DETECTED  NOT DETECTED   Stenotrophomonas maltophilia NOT DETECTED NOT DETECTED   Candida albicans NOT DETECTED NOT DETECTED   Candida auris NOT DETECTED NOT DETECTED   Candida glabrata NOT DETECTED NOT DETECTED   Candida krusei NOT DETECTED NOT DETECTED   Candida parapsilosis NOT DETECTED NOT DETECTED   Candida tropicalis NOT DETECTED NOT DETECTED   Cryptococcus neoformans/gattii NOT DETECTED NOT DETECTED   CTX-M ESBL NOT DETECTED NOT DETECTED   Carbapenem resistance IMP NOT DETECTED NOT DETECTED   Carbapenem resistance KPC NOT DETECTED NOT DETECTED   Carbapenem resistance NDM NOT DETECTED NOT DETECTED   Carbapenem resist OXA 48 LIKE NOT DETECTED NOT DETECTED   Carbapenem resistance VIM NOT DETECTED NOT DETECTED    Lynwood Poplar, PharmD, BCPS Clinical Pharmacist 04/23/2024 12:15 AM

## 2024-04-23 NOTE — Evaluation (Signed)
 Occupational Therapy Evaluation Patient Details Name: Brian [name] MRN: [id number] DOB: Feb 05, 1934 Today's Date: 04/23/2024   History of Present Illness   89 y.o. male who presented to the ED 8/29 for evaluation of fever, chills and dysuria. Admitted for treatment of sepsis acute cystitis PMH: Alzheimer's dementia, severe aortic stenosis s/p TAVR, chronic diastolic HF, HTN, HLD, T2DM, CKD 3B, BPH, GERD and arthritis     Clinical Impressions This 89 yo male admitted with above presents to acute OT with PLOF of being able to do all of his ADLs independently, no AD for ambulation, S due to cognitive deficits. He currently is at a setup-CGA without AD for ADLs. He will benefit from one more session of acute OT (unless he D/C's home before he is scheduled next to see), no follow up OT, but do recommended continued 24 hour S due to cognitive deficits.     If plan is discharge home, recommend the following:   A little help with walking and/or transfers;A little help with bathing/dressing/bathroom;Help with stairs or ramp for entrance;Assist for transportation;Assistance with cooking/housework;Direct supervision/assist for financial management;Direct supervision/assist for medications management     Functional Status Assessment   Patient has had a recent decline in their functional status and demonstrates the ability to make significant improvements in function in a reasonable and predictable amount of time.     Equipment Recommendations   None recommended by OT      Precautions/Restrictions   Precautions Precautions: Fall Restrictions Weight Bearing Restrictions Per Provider Order: No     Mobility Bed Mobility Overal bed mobility: Modified Independent                  Transfers Overall transfer level: Needs assistance Equipment used: None Transfers: Sit to/from Stand Sit to Stand: Contact guard assist           General transfer comment: (P) mild  posterior lean on first stand from bed;      Balance Overall balance assessment: Mild deficits observed, not formally tested                                         ADL either performed or assessed with clinical judgement   ADL Overall ADL's : Needs assistance/impaired Eating/Feeding: Independent;Bed level   Grooming: Contact guard assist;Standing;Wash/dry face;Oral care   Upper Body Bathing: Set up;Sitting;Supervision/ safety   Lower Body Bathing: Contact guard assist;Sit to/from stand   Upper Body Dressing : Set up;Sitting   Lower Body Dressing: Contact guard assist;Sit to/from stand   Toilet Transfer: Contact guard assist;Ambulation Toilet Transfer Details (indicate cue type and reason): no AD, simulated bed>sink to groom>bed>down hallway and back to bed Toileting- Clothing Manipulation and Hygiene: Minimal assistance Toileting - Clothing Manipulation Details (indicate cue type and reason): CGA sit<>stand             Vision Patient Visual Report: No change from baseline              Pertinent Vitals/Pain Pain Assessment Pain Assessment: No/denies pain     Extremity/Trunk Assessment Upper Extremity Assessment Upper Extremity Assessment: Overall WFL for tasks assessed           Communication Communication Communication: Impaired Factors Affecting Communication: Difficulty expressing self (due to dementia, repeats today a phrase he learned in the [employer])   Cognition Arousal: Alert Behavior During Therapy: Impulsive Cognition: History of cognitive impairments  Cueing  Cueing Techniques: Verbal cues;Gestural cues;Tactile cues              Home Living Family/patient expects to be discharged to:: Private residence Living Arrangements: Spouse/significant other Available Help at Discharge: Family;Available 24 hours/day Type of Home: House Home Access: Stairs to enter [employer] of Steps: 4 Entrance Stairs-Rails: Left Home Layout: Two level     Bathroom Shower/Tub: Chief Strategy Officer: Standard     Home Equipment: Grab bars - tub/shower;Hand held shower head          Prior Functioning/Environment Prior Level of Function : Independent/Modified Independent             Mobility Comments: independent, has cane and RW and uses as needed ADLs Comments: independent for ADLs, wife assists with iADLs, pt has HHAide who assists    OT Problem List: Impaired balance (sitting and/or standing);Decreased cognition;Decreased safety awareness   OT Treatment/Interventions: Self-care/ADL training;DME and/or AE instruction;Balance training;Patient/family education      OT Goals(Current goals can be found in the care plan section)   Acute Rehab OT Goals Patient Stated Goal: to be up and about OT Goal Formulation: With patient Time For Goal Achievement: 05/07/24 Potential to Achieve Goals: Good   OT Frequency:  Min 2X/week       AM-PAC OT 6 Clicks Daily Activity     Outcome Measure Help from another person eating meals?: None Help from another person taking care of personal grooming?: A Little Help from another person toileting, which includes using toliet, bedpan, or urinal?: A Little Help from another person bathing (including washing, rinsing, drying)?: A Little Help from another person to put on and taking off regular upper body clothing?: A Little Help from another person to put on and taking off regular lower body clothing?: A Little 6 Click Score: 19   End of Session Equipment Utilized During Treatment: Gait belt  Activity Tolerance: Patient tolerated treatment well Patient left: in bed;with call bell/phone within reach;with bed alarm set;with family/visitor present  OT Visit Diagnosis: Unsteadiness on feet (R26.81);Other abnormalities of gait and mobility (R26.89);Other symptoms and signs involving cognitive  function                Time: 1356-1420 OT Time Calculation (min): 24 min Charges:  OT General Charges $OT Visit: 1 Visit OT Evaluation $OT Eval Moderate Complexity: 1 Mod OT Treatments $Self Care/Home Management : 8-22 mins  Brian [name] OT Acute Rehabilitation Services Office [phone]    Brian [name] 04/23/2024, 3:41 PM

## 2024-04-23 NOTE — Plan of Care (Signed)

## 2024-04-23 NOTE — Progress Notes (Signed)
 PROGRESS NOTE  Brian [name] FMW:[id number] DOB: 05-04-1934 DOA: 04/22/2024 PCP: Brian Redell POUR, MD   LOS: 1 day   Brief Narrative / Interim history: 89 year old male with Alzheimer's dementia, severe AS status post TAVR HTN, HLD, CKD 3B who comes into the hospital with fever, chills, dysuria.  He was febrile to 101 at home per his spouse, has had some troubles urinating and she got concerned about a UTI and brought him to the hospital.  He was found to have a positive UA, admitted and shortly after admission his blood cultures became positive for E. coli  Subjective / 24h Interval events: He is doing well this morning, he is pleasantly confused, smiling, has no complaints, no fever, no chills, no abdominal discomfort, no nausea or vomiting.  His spouse is at bedside  Assesement and Plan: Principal problem Sepsis secondary to UTI, E. coli bacteremia -patient is on ceftriaxone  2 g, continue for now - Await final blood culture speciation and sensitivities, temp of 99 this morning but no high fevers overnight.  Looks like sepsis physiology is improving.  He has no white count  Active problems DM2, with hyperglycemia -continue Lantus , sliding scale  CKD 3B-baseline 1.5 - 1.6, currently at baseline  Essential hypertension-Home medications were discontinued prior to current hospitalization due to outpatient hypotension  Chronic diastolic CHF, aortic stenosis status post TAVR-noted  Anemia-of chronic renal disease, hemoglobin slightly lower today, dilutional due to IV fluids on admission.  He has no bleeding.  Continue to monitor  Inflammatory arthritis-on prednisone  and gabapentin , this will be continued  BPH-continue home medications as below  Dementia-noted, often sundowns per spouse, continue Seroquel   Generalized weakness-in the setting of bacteremia, PT consulted  Scheduled Meds:  aspirin  EC  81 mg Oral Daily   docusate sodium   100 mg Oral Daily   enoxaparin  (LOVENOX )  injection  30 mg Subcutaneous Q24H   ferrous sulfate   325 mg Oral Q M,W,F   finasteride   5 mg Oral Daily   gabapentin   300 mg Oral QHS   insulin  aspart  0-15 Units Subcutaneous TID WC   insulin  glargine  5 Units Subcutaneous Daily   omega-3 acid ethyl esters  1 g Oral Daily   pantoprazole   40 mg Oral Daily   predniSONE   5 mg Oral Q breakfast   QUEtiapine   25 mg Oral QPM   tamsulosin   0.4 mg Oral Daily   Continuous Infusions:  cefTRIAXone  (ROCEPHIN )  IV 2 g (04/23/24 0835)   PRN Meds:.acetaminophen  **OR** acetaminophen , bisacodyl , ondansetron  **OR** ondansetron  (ZOFRAN ) IV, senna-docusate  Current Outpatient Medications  Medication Instructions   ACCU-CHEK GUIDE test strip USE UP TO 4 TIMES A DAY AS DIRECTED   acetaminophen  (TYLENOL ) 1,300 mg, Oral, Daily   aspirin  EC 81 mg, Oral, Daily   diclofenac Sodium (VOLTAREN) 1 % GEL 1 Application, Topical, 2 times daily PRN   docusate sodium  (DOK) 100 mg, Oral, 2 times daily PRN   FeroSul 325 mg, Oral, Every other day   finasteride  (PROSCAR ) 5 mg, Oral, Daily   gabapentin  (NEURONTIN ) 300 mg, Oral, Daily at bedtime   insulin  glargine-yfgn (SEMGLEE  (YFGN)) 5 Units, Subcutaneous, Daily   Insulin  Pen Needle (B-D UF III MINI PEN NEEDLES) 31G X 5 MM MISC USE AS DIRECTED WITH LEVEMIR  INSULIN  PENS AT BEDTIME, once a day   lisinopril  (ZESTRIL ) 40 mg, Oral, Daily, Home med.   metoprolol  succinate (TOPROL -XL) 37.5 mg, Daily   Omega-3 Fatty Acids (FISH OIL  PO) 1 capsule, Oral, Daily  pantoprazole  (PROTONIX ) 40 mg, Oral, 2 times daily, After completing 2 months, take 40 mg daily.   predniSONE  (DELTASONE ) 5 mg, Oral, Daily   QUEtiapine  (SEROQUEL ) 25 mg, Oral, Every evening   QUEtiapine  (SEROQUEL ) 50 mg, Oral, At bedtime PRN   tamsulosin  (FLOMAX ) 0.4 MG CAPS capsule TAKE 1 CAPSULE BY MOUTH EVERY DAY    Diet Orders (From admission, onward)     Start     Ordered   04/22/24 0956  Diet Carb Modified Fluid consistency: Thin; Room service appropriate?  Yes  Diet effective now       Question Answer Comment  Diet-HS Snack? Nothing   Calorie Level Medium 1600-2000   Fluid consistency: Thin   Room service appropriate? Yes      04/22/24 0955            DVT prophylaxis: enoxaparin  (LOVENOX ) injection 30 mg Start: 04/22/24 1030   Lab Results  Component Value Date   PLT 208 04/23/2024      Code Status: Full Code  Family Communication: Wife present at bedside  Status is: Inpatient Remains inpatient appropriate because: Severity of illness   Level of care: Telemetry Medical  Consultants:  None  Objective: Vitals:   04/22/24 1655 04/22/24 2020 04/23/24 0420 04/23/24 0832  BP: 108/61 131/64 (!) 110/99 (!) 129/58  Pulse: 76 77 87 82  Resp: 18 18 18 18   Temp: 98.1 F (36.7 C) 98 F (36.7 C) 98.4 F (36.9 C) 99 F (37.2 C)  TempSrc: Oral Oral Oral Oral  SpO2: 100% 100% 99% 100%  Weight:      Height:        Intake/Output Summary (Last 24 hours) at 04/23/2024 1001 Last data filed at 04/23/2024 0830 Gross per 24 hour  Intake 1245 ml  Output 200 ml  Net 1045 ml   Wt Readings from Last 3 Encounters:  04/22/24 62 kg  09/30/23 62 kg  03/24/23 60.1 kg    Examination:  Constitutional: NAD Eyes: no scleral icterus ENMT: Mucous membranes are moist.  Neck: normal, supple Respiratory: clear to auscultation bilaterally, no wheezing, no crackles.  Cardiovascular: Regular rate and rhythm Abdomen: non distended, no tenderness. Bowel sounds positive.  Musculoskeletal: no clubbing / cyanosis.    Data Reviewed: I have independently reviewed following labs and imaging studies   CBC Recent Labs  Lab 04/22/24 0801 04/23/24 0611  WBC 7.3 9.0  HGB 8.7* 7.6*  HCT 27.4* 24.1*  PLT 231 208  MCV 91.6 90.9  MCH 29.1 28.7  MCHC 31.8 31.5  RDW 16.0* 15.9*  LYMPHSABS 1.0  --   MONOABS 0.1  --   EOSABS 0.1  --   BASOSABS 0.0  --     Recent Labs  Lab 04/22/24 0801 04/22/24 0815 04/22/24 1002 04/23/24 0611  NA  139  --   --  138  K 4.1  --   --  3.8  CL 103  --   --  106  CO2 23  --   --  22  GLUCOSE 226*  --   --  113*  BUN 20  --   --  25*  CREATININE 1.64*  --   --  1.46*  CALCIUM  9.2  --   --  8.5*  AST 22  --   --   --   ALT 15  --   --   --   ALKPHOS 55  --   --   --   BILITOT 1.0  --   --   --  ALBUMIN  3.4*  --   --   --   LATICACIDVEN  --  2.0* 1.2  --   INR 1.1  --   --   --   HGBA1C 8.4*  --   --   --     ------------------------------------------------------------------------------------------------------------------ No results for input(s): CHOL, HDL, LDLCALC, TRIG, CHOLHDL, LDLDIRECT in the last 72 hours.  Lab Results  Component Value Date   HGBA1C 8.4 (H) 04/22/2024   ------------------------------------------------------------------------------------------------------------------ No results for input(s): TSH, T4TOTAL, T3FREE, THYROIDAB in the last 72 hours.  Invalid input(s): FREET3  Cardiac Enzymes No results for input(s): CKMB, TROPONINI, MYOGLOBIN in the last 168 hours.  Invalid input(s): CK ------------------------------------------------------------------------------------------------------------------    Component Value Date/Time   BNP 113.8 (H) 10/01/2023 0535    CBG: Recent Labs  Lab 04/22/24 1123 04/22/24 1657 04/23/24 0829  GLUCAP 156* 140* 145*    Recent Results (from the past 240 hours)  Culture, blood (Routine x 2)     Status: Abnormal (Preliminary result)   Collection Time: 04/22/24  8:06 AM   Specimen: BLOOD  Result Value Ref Range Status   Specimen Description BLOOD RIGHT ANTECUBITAL  Final   Special Requests   Final    BOTTLES DRAWN AEROBIC AND ANAEROBIC Blood Culture adequate volume   Culture  Setup Time   Final    GRAM NEGATIVE RODS IN BOTH AEROBIC AND ANAEROBIC BOTTLES CRITICAL RESULT CALLED TO, READ BACK BY AND VERIFIED WITH: PHARMD RUTHA GRADE [phone] FCP    Culture (A)  Final    ESCHERICHIA  COLI SUSCEPTIBILITIES TO FOLLOW Performed at [facility], [address] [place], [place] [address]    Report Status PENDING  Incomplete  Blood Culture ID Panel (Reflexed)     Status: Abnormal   Collection Time: 04/22/24  8:06 AM  Result Value Ref Range Status   Enterococcus faecalis NOT DETECTED NOT DETECTED Final   Enterococcus Faecium NOT DETECTED NOT DETECTED Final   Listeria monocytogenes NOT DETECTED NOT DETECTED Final   Staphylococcus species NOT DETECTED NOT DETECTED Final   Staphylococcus aureus (BCID) NOT DETECTED NOT DETECTED Final   Staphylococcus epidermidis NOT DETECTED NOT DETECTED Final   Staphylococcus lugdunensis NOT DETECTED NOT DETECTED Final   Streptococcus species NOT DETECTED NOT DETECTED Final   Streptococcus agalactiae NOT DETECTED NOT DETECTED Final   Streptococcus pneumoniae NOT DETECTED NOT DETECTED Final   Streptococcus pyogenes NOT DETECTED NOT DETECTED Final   A.calcoaceticus-baumannii NOT DETECTED NOT DETECTED Final   Bacteroides fragilis NOT DETECTED NOT DETECTED Final   Enterobacterales DETECTED (A) NOT DETECTED Final    Comment: Enterobacterales represent a large order of gram negative bacteria, not a single organism. CRITICAL RESULT CALLED TO, READ BACK BY AND VERIFIED WITH: PHARMD JIMMY Y [phone] FCP    Enterobacter cloacae complex NOT DETECTED NOT DETECTED Final   Escherichia coli DETECTED (A) NOT DETECTED Final    Comment: CRITICAL RESULT CALLED TO, READ BACK BY AND VERIFIED WITH: PHARMD JIMMY Y [phone] FCP    Klebsiella aerogenes NOT DETECTED NOT DETECTED Final   Klebsiella oxytoca NOT DETECTED NOT DETECTED Final   Klebsiella pneumoniae NOT DETECTED NOT DETECTED Final   Proteus species NOT DETECTED NOT DETECTED Final   Salmonella species NOT DETECTED NOT DETECTED Final   Serratia marcescens NOT DETECTED NOT DETECTED Final   Haemophilus influenzae NOT DETECTED NOT DETECTED Final   Neisseria meningitidis NOT DETECTED NOT  DETECTED Final   Pseudomonas aeruginosa NOT DETECTED NOT DETECTED Final  Stenotrophomonas maltophilia NOT DETECTED NOT DETECTED Final   Candida albicans NOT DETECTED NOT DETECTED Final   Candida auris NOT DETECTED NOT DETECTED Final   Candida glabrata NOT DETECTED NOT DETECTED Final   Candida krusei NOT DETECTED NOT DETECTED Final   Candida parapsilosis NOT DETECTED NOT DETECTED Final   Candida tropicalis NOT DETECTED NOT DETECTED Final   Cryptococcus neoformans/gattii NOT DETECTED NOT DETECTED Final   CTX-M ESBL NOT DETECTED NOT DETECTED Final   Carbapenem resistance IMP NOT DETECTED NOT DETECTED Final   Carbapenem resistance KPC NOT DETECTED NOT DETECTED Final   Carbapenem resistance NDM NOT DETECTED NOT DETECTED Final   Carbapenem resist OXA 48 LIKE NOT DETECTED NOT DETECTED Final   Carbapenem resistance VIM NOT DETECTED NOT DETECTED Final    Comment: Performed at [facility], [address], [place], [place] [address]  Culture, blood (Routine x 2)     Status: None (Preliminary result)   Collection Time: 04/22/24  8:27 AM   Specimen: BLOOD RIGHT ARM  Result Value Ref Range Status   Specimen Description BLOOD RIGHT ARM  Final   Special Requests   Final    BOTTLES DRAWN AEROBIC AND ANAEROBIC Blood Culture adequate volume   Culture  Setup Time   Final    GRAM NEGATIVE RODS IN BOTH AEROBIC AND ANAEROBIC BOTTLES CRITICAL VALUE NOTED.  VALUE IS CONSISTENT WITH PREVIOUSLY REPORTED AND CALLED VALUE.    Culture   Final    GRAM NEGATIVE RODS IDENTIFICATION TO FOLLOW Performed at [facility], [address], [place], [place] [address]    Report Status PENDING  Incomplete  Resp panel by RT-PCR (RSV, Flu A&B, Covid) Anterior Nasal Swab     Status: None   Collection Time: 04/22/24  8:27 AM   Specimen: Anterior Nasal Swab  Result Value Ref Range Status   SARS Coronavirus 2 by RT PCR NEGATIVE NEGATIVE Final   Influenza A by PCR NEGATIVE NEGATIVE Final   Influenza B by PCR  NEGATIVE NEGATIVE Final    Comment: (NOTE) The Xpert Xpress SARS-CoV-2/FLU/RSV plus assay is intended as an aid in the diagnosis of influenza from Nasopharyngeal swab specimens and should not be used as a sole basis for treatment. Nasal washings and aspirates are unacceptable for Xpert Xpress SARS-CoV-2/FLU/RSV testing.  Fact Sheet for Patients: BloggerCourse.com  Fact Sheet for Healthcare Providers: SeriousBroker.it  This test is not yet approved or cleared by the [place]  FDA and has been authorized for detection and/or diagnosis of SARS-CoV-2 by FDA under an Emergency Use Authorization (EUA). This EUA will remain in effect (meaning this test can be used) for the duration of the COVID-19 declaration under Section 564(b)(1) of the Act, 21 U.S.C. section 360bbb-3(b)(1), unless the authorization is terminated or revoked.     Resp Syncytial Virus by PCR NEGATIVE NEGATIVE Final    Comment: (NOTE) Fact Sheet for Patients: BloggerCourse.com  Fact Sheet for Healthcare Providers: SeriousBroker.it  This test is not yet approved or cleared by the [place]  FDA and has been authorized for detection and/or diagnosis of SARS-CoV-2 by FDA under an Emergency Use Authorization (EUA). This EUA will remain in effect (meaning this test can be used) for the duration of the COVID-19 declaration under Section 564(b)(1) of the Act, 21 U.S.C. section 360bbb-3(b)(1), unless the authorization is terminated or revoked.  Performed at [facility], [address] [place], [place] [address]   Urine Culture     Status: Abnormal (Preliminary result)  Collection Time: 04/22/24  8:27 AM   Specimen: Urine, Random  Result Value Ref Range Status   Specimen Description URINE, RANDOM  Final   Special Requests NONE Reflexed from [id number]  Final   Culture (A)  Final    >=100,000 COLONIES/mL GRAM  NEGATIVE RODS SUSCEPTIBILITIES TO FOLLOW Performed at [facility], [address] [place], [place] [address]    Report Status PENDING  Incomplete     Radiology Studies: No results found.   Nilda Fendt, MD, PhD [facility]  Between 7 am - 7 pm I am available, please contact me via Amion (for emergencies) or Securechat (non urgent messages)  Between 7 pm - 7 am I am not available, please contact night coverage MD/APP via Amion

## 2024-04-24 DIAGNOSIS — A419 Sepsis, unspecified organism: Secondary | ICD-10-CM | POA: Diagnosis not present

## 2024-04-24 DIAGNOSIS — N390 Urinary tract infection, site not specified: Secondary | ICD-10-CM | POA: Diagnosis not present

## 2024-04-24 LAB — CBC
HCT: 22.9 % — ABNORMAL LOW (ref 39.0–52.0)
Hemoglobin: 7.4 g/dL — ABNORMAL LOW (ref 13.0–17.0)
MCH: 28.8 pg (ref 26.0–34.0)
MCHC: 32.3 g/dL (ref 30.0–36.0)
MCV: 89.1 fL (ref 80.0–100.0)
Platelets: 182 K/uL (ref 150–400)
RBC: 2.57 MIL/uL — ABNORMAL LOW (ref 4.22–5.81)
RDW: 15.6 % — ABNORMAL HIGH (ref 11.5–15.5)
WBC: 5.3 K/uL (ref 4.0–10.5)
nRBC: 0 % (ref 0.0–0.2)

## 2024-04-24 LAB — COMPREHENSIVE METABOLIC PANEL WITH GFR
ALT: 13 U/L (ref 0–44)
AST: 19 U/L (ref 15–41)
Albumin: 2.7 g/dL — ABNORMAL LOW (ref 3.5–5.0)
Alkaline Phosphatase: 40 U/L (ref 38–126)
Anion gap: 8 (ref 5–15)
BUN: 22 mg/dL (ref 8–23)
CO2: 24 mmol/L (ref 22–32)
Calcium: 8.6 mg/dL — ABNORMAL LOW (ref 8.9–10.3)
Chloride: 106 mmol/L (ref 98–111)
Creatinine, Ser: 1.41 mg/dL — ABNORMAL HIGH (ref 0.61–1.24)
GFR, Estimated: 48 mL/min — ABNORMAL LOW (ref >60)
Glucose, Bld: 109 mg/dL — ABNORMAL HIGH (ref 70–99)
Potassium: 3.8 mmol/L (ref 3.5–5.1)
Sodium: 138 mmol/L (ref 135–145)
Total Bilirubin: 0.4 mg/dL (ref 0.0–1.2)
Total Protein: 5.8 g/dL — ABNORMAL LOW (ref 6.5–8.1)

## 2024-04-24 LAB — CULTURE, BLOOD (ROUTINE X 2)
Special Requests: ADEQUATE
Special Requests: ADEQUATE

## 2024-04-24 LAB — GLUCOSE, CAPILLARY
Glucose-Capillary: 106 mg/dL — ABNORMAL HIGH (ref 70–99)
Glucose-Capillary: 245 mg/dL — ABNORMAL HIGH (ref 70–99)

## 2024-04-24 LAB — URINE CULTURE: Culture: >=100000 — AB

## 2024-04-24 LAB — MAGNESIUM: Magnesium: 1.7 mg/dL (ref 1.7–2.4)

## 2024-04-24 MED ORDER — LEVOFLOXACIN 750 MG PO TABS: 750.0000 mg | ORAL_TABLET | ORAL | 0 refills | Status: AC

## 2024-04-24 MED ORDER — FERROUS SULFATE 325 (65 FE) MG PO TABS: 325.0000 mg | ORAL_TABLET | Freq: Every day | ORAL | 0 refills | Status: AC

## 2024-04-24 NOTE — Discharge Summary (Addendum)
 Physician Discharge Summary  Brian [name] FMW:[id number] DOB: [birth year] DOA: 04/22/2024  PCP: Millicent Redell POUR, MD  Admit date: 04/22/2024 Discharge date: 04/24/2024  Admitted From: home Disposition:  home  Recommendations for Outpatient Follow-up:  Follow up with PCP in 4-5 days Please obtain BMP/CBC in 4-5 days  Home Health: none Equipment/Devices: none  Discharge Condition: stable CODE STATUS: Full code Diet Orders (From admission, onward)     Start     Ordered   04/22/24 0956  Diet Carb Modified Fluid consistency: Thin; Room service appropriate? Yes  Diet effective now       Question Answer Comment  Diet-HS Snack? Nothing   Calorie Level Medium 1600-2000   Fluid consistency: Thin   Room service appropriate? Yes      04/22/24 0955           Brief Narrative / Interim history: 89 year old male with Alzheimer's dementia, severe AS status post TAVR HTN, HLD, CKD 3B who comes into the hospital with fever, chills, dysuria.  He was febrile to 101 at home per his spouse, has had some troubles urinating and she got concerned about a UTI and brought him to the hospital.  He was found to have a positive UA, admitted and shortly after admission his blood cultures became positive for E. coli  Hospital Course / Discharge diagnoses: Principal problem Sepsis secondary to UTI, E. coli bacteremia -patient was admitted to the hospital with fever at home, found to have E. coli UTI as well as E. coli bacteremia.  He received IV ceftriaxone , clinically improved, sepsis physiology resolved, he is now afebrile and white count is normal.  He will be transition to Levaquin  based on sensitivities, and discharged home in stable condition.  He feels back to baseline, ambulatory.  Case was briefly discussed with ID as well as pharmacy  Active problems DM2, with hyperglycemia -continue home medications  CKD 3B-baseline 1.5 - 1.6, currently at baseline Essential hypertension-Home medications  were discontinued prior to current hospitalization due to outpatient hypotension Chronic diastolic CHF, aortic stenosis status post TAVR-noted Anemia-of chronic renal disease, hemoglobin slightly lower on discharge than on admission, this is likely dilutional as all lines have decreased after IV fluids.  He has a history of a GI bleed about a year ago, is on iron supplementation as well as PPI.  Currently there are no signs of bleeding.  Recommend repeat lab work next week at the PCPs office.  This was discussed with the family  Inflammatory arthritis-continue home prednisone   BPH-continue home medications as below Dementia-noted, often sundowns per spouse, continue Seroquel  Generalized weakness-in the setting of bacteremia, PT consulted, no PT follow-up was recommended, he is ambulatory in the hallway without difficulties  Sepsis ruled out   Discharge Instructions   Allergies as of 04/24/2024       Reactions   Coreg  [carvedilol ] Other (See Comments)   Bradycardia    Niaspan [niacin Er (antihyperlipidemic)] Other (See Comments)   Unknown reaction   Norvasc  [amlodipine ] Other (See Comments)   Unknown reaction   Shellfish Allergy Nausea And Vomiting   Statins Other (See Comments)   Myalgia   Sulfa Antibiotics Other (See Comments)   Unknown reaction   Zetia [ezetimibe] Other (See Comments)   Unknown reaction   Januvia  [sitagliptin ] Rash        Medication List     STOP taking these medications    lisinopril  40 MG tablet Commonly known as: ZESTRIL    metoprolol  succinate 25 MG 24  hr tablet Commonly known as: TOPROL -XL       TAKE these medications    Accu-Chek Guide test strip Generic drug: glucose blood USE UP TO 4 TIMES A DAY AS DIRECTED   acetaminophen  650 MG CR tablet Commonly known as: TYLENOL  Take 1,300 mg by mouth daily.   aspirin  EC 81 MG tablet Take 81 mg by mouth daily.   diclofenac Sodium 1 % Gel Commonly known as: VOLTAREN Apply 1 Application  topically 2 (two) times daily as needed (pain).   docusate sodium  100 MG capsule Commonly known as: DOK Take 1 capsule (100 mg total) by mouth 2 (two) times daily as needed. What changed: when to take this   ferrous sulfate  325 (65 FE) MG tablet Take 1 tablet (325 mg total) by mouth daily with breakfast. What changed: when to take this   finasteride  5 MG tablet Commonly known as: PROSCAR  Take 1 tablet (5 mg total) by mouth daily.   FISH OIL  PO Take 1 capsule by mouth daily.   gabapentin  300 MG capsule Commonly known as: NEURONTIN  Take 300 mg by mouth at bedtime.   Insulin  Pen Needle 31G X 5 MM Misc Commonly known as: B-D UF III MINI PEN NEEDLES USE AS DIRECTED WITH LEVEMIR  INSULIN  PENS AT BEDTIME, once a day   levofloxacin  750 MG tablet Commonly known as: Levaquin  Take 1 tablet (750 mg total) by mouth every other day for 3 doses.   pantoprazole  40 MG tablet Commonly known as: PROTONIX  Take 1 tablet (40 mg total) by mouth 2 (two) times daily. After completing 2 months, take 40 mg daily. What changed:  when to take this additional instructions   predniSONE  5 MG tablet Commonly known as: DELTASONE  Take 5 mg by mouth daily.   QUEtiapine  50 MG tablet Commonly known as: SEROQUEL  Take 50 mg by mouth at bedtime as needed (agitation).   QUEtiapine  25 MG tablet Commonly known as: SEROQUEL  Take 25 mg by mouth every evening.   Semglee  (yfgn) 100 UNIT/ML Pen Generic drug: insulin  glargine-yfgn Inject 5 Units into the skin daily.   tamsulosin  0.4 MG Caps capsule Commonly known as: FLOMAX  TAKE 1 CAPSULE BY MOUTH EVERY DAY        Follow-up Information     Millicent Redell POUR, MD Follow up in 4 day(s).   Specialty: Hospitalist Contact information: [address] [place] [address] [phone]                 Consultations: none  Procedures/Studies:  DG Chest Port 1 View Result Date: 04/22/2024 CLINICAL DATA:  Questionable sepsis - evaluate for  abnormality. EXAM: PORTABLE CHEST 1 VIEW COMPARISON:  11/14/2023. FINDINGS: Bilateral lung fields are clear. Bilateral costophrenic angles are clear. Normal cardio-mediastinal silhouette. Prosthetic aortic valve noted. No acute osseous abnormalities. The soft tissues are within normal limits. IMPRESSION: No active disease. Electronically Signed   By: Ree Molt M.D.   On: 04/22/2024 08:41     Subjective: - no chest pain, shortness of breath, no abdominal pain, nausea or vomiting.   Discharge Exam: BP (!) 146/65 (BP Location: Right Arm)   Pulse 84   Temp 99.7 F (37.6 C) (Oral)   Resp 18   Ht 5' 9 (1.753 m)   Wt 62 kg   SpO2 97%   BMI 20.18 kg/m   General: Pt is alert, awake, not in acute distress Cardiovascular: RRR, S1/S2 +, no rubs, no gallops Respiratory: CTA bilaterally, no wheezing, no rhonchi Abdominal: Soft, NT, ND,  bowel sounds + Extremities: no edema, no cyanosis   The results of significant diagnostics from this hospitalization (including imaging, microbiology, ancillary and laboratory) are listed below for reference.     Microbiology: Recent Results (from the past 240 hours)  Culture, blood (Routine x 2)     Status: Abnormal (Preliminary result)   Collection Time: 04/22/24  8:06 AM   Specimen: BLOOD  Result Value Ref Range Status   Specimen Description BLOOD RIGHT ANTECUBITAL  Final   Special Requests   Final    BOTTLES DRAWN AEROBIC AND ANAEROBIC Blood Culture adequate volume   Culture  Setup Time   Final    GRAM NEGATIVE RODS IN BOTH AEROBIC AND ANAEROBIC BOTTLES CRITICAL RESULT CALLED TO, READ BACK BY AND VERIFIED WITH: MAYA RUTHA CINDERELLA DONIVAN [id number] FCP Performed at [facility], [address] [place], [place] [address]    Culture ESCHERICHIA COLI (A)  Final   Report Status PENDING  Incomplete   Organism ID, Bacteria ESCHERICHIA COLI  Final      Susceptibility   Escherichia coli - MIC*    AMPICILLIN >=32 RESISTANT Resistant     CEFAZOLIN   (NON-URINE) 4 INTERMEDIATE Intermediate     CEFEPIME <=0.12 SENSITIVE Sensitive     ERTAPENEM  <=0.12 SENSITIVE Sensitive     CEFTRIAXONE  <=0.25 SENSITIVE Sensitive     CIPROFLOXACIN <=0.06 SENSITIVE Sensitive     GENTAMICIN <=1 SENSITIVE Sensitive     MEROPENEM <=0.25 SENSITIVE Sensitive     TRIMETH/SULFA <=20 SENSITIVE Sensitive     AMPICILLIN/SULBACTAM >=32 RESISTANT Resistant     PIP/TAZO Value in next row Sensitive ug/mL     <=4 SENSITIVEThis is a modified FDA-approved test that has been validated and its performance characteristics determined by the reporting laboratory.  This laboratory is certified under the Clinical Laboratory Improvement Amendments CLIA as qualified to perform high complexity clinical laboratory testing.    * ESCHERICHIA COLI  Blood Culture ID Panel (Reflexed)     Status: Abnormal   Collection Time: 04/22/24  8:06 AM  Result Value Ref Range Status   Enterococcus faecalis NOT DETECTED NOT DETECTED Final   Enterococcus Faecium NOT DETECTED NOT DETECTED Final   Listeria monocytogenes NOT DETECTED NOT DETECTED Final   Staphylococcus species NOT DETECTED NOT DETECTED Final   Staphylococcus aureus (BCID) NOT DETECTED NOT DETECTED Final   Staphylococcus epidermidis NOT DETECTED NOT DETECTED Final   Staphylococcus lugdunensis NOT DETECTED NOT DETECTED Final   Streptococcus species NOT DETECTED NOT DETECTED Final   Streptococcus agalactiae NOT DETECTED NOT DETECTED Final   Streptococcus pneumoniae NOT DETECTED NOT DETECTED Final   Streptococcus pyogenes NOT DETECTED NOT DETECTED Final   A.calcoaceticus-baumannii NOT DETECTED NOT DETECTED Final   Bacteroides fragilis NOT DETECTED NOT DETECTED Final   Enterobacterales DETECTED (A) NOT DETECTED Final    Comment: Enterobacterales represent a large order of gram negative bacteria, not a single organism. CRITICAL RESULT CALLED TO, READ BACK BY AND VERIFIED WITH: PHARMD JIMMY Y [phone] [id number] FCP    Enterobacter cloacae  complex NOT DETECTED NOT DETECTED Final   Escherichia coli DETECTED (A) NOT DETECTED Final    Comment: CRITICAL RESULT CALLED TO, READ BACK BY AND VERIFIED WITH: PHARMD JIMMY Y [phone] [id number] FCP    Klebsiella aerogenes NOT DETECTED NOT DETECTED Final   Klebsiella oxytoca NOT DETECTED NOT DETECTED Final   Klebsiella pneumoniae NOT DETECTED NOT DETECTED Final   Proteus species NOT DETECTED NOT DETECTED Final   Salmonella species NOT DETECTED NOT  DETECTED Final   Serratia marcescens NOT DETECTED NOT DETECTED Final   Haemophilus influenzae NOT DETECTED NOT DETECTED Final   Neisseria meningitidis NOT DETECTED NOT DETECTED Final   Pseudomonas aeruginosa NOT DETECTED NOT DETECTED Final   Stenotrophomonas maltophilia NOT DETECTED NOT DETECTED Final   Candida albicans NOT DETECTED NOT DETECTED Final   Candida auris NOT DETECTED NOT DETECTED Final   Candida glabrata NOT DETECTED NOT DETECTED Final   Candida krusei NOT DETECTED NOT DETECTED Final   Candida parapsilosis NOT DETECTED NOT DETECTED Final   Candida tropicalis NOT DETECTED NOT DETECTED Final   Cryptococcus neoformans/gattii NOT DETECTED NOT DETECTED Final   CTX-M ESBL NOT DETECTED NOT DETECTED Final   Carbapenem resistance IMP NOT DETECTED NOT DETECTED Final   Carbapenem resistance KPC NOT DETECTED NOT DETECTED Final   Carbapenem resistance NDM NOT DETECTED NOT DETECTED Final   Carbapenem resist OXA 48 LIKE NOT DETECTED NOT DETECTED Final   Carbapenem resistance VIM NOT DETECTED NOT DETECTED Final    Comment: Performed at [facility], [address] [place], [place] [address]  Culture, blood (Routine x 2)     Status: Abnormal (Preliminary result)   Collection Time: 04/22/24  8:27 AM   Specimen: BLOOD RIGHT ARM  Result Value Ref Range Status   Specimen Description BLOOD RIGHT ARM  Final   Special Requests   Final    BOTTLES DRAWN AEROBIC AND ANAEROBIC Blood Culture adequate volume   Culture  Setup Time   Final    GRAM  NEGATIVE RODS IN BOTH AEROBIC AND ANAEROBIC BOTTLES CRITICAL VALUE NOTED.  VALUE IS CONSISTENT WITH PREVIOUSLY REPORTED AND CALLED VALUE.    Culture (A)  Final    ESCHERICHIA COLI SUSCEPTIBILITIES PERFORMED ON PREVIOUS CULTURE WITHIN THE LAST 5 DAYS. Performed at [facility], [address], [place], [place] [address]    Report Status PENDING  Incomplete  Resp panel by RT-PCR (RSV, Flu A&B, Covid) Anterior Nasal Swab     Status: None   Collection Time: 04/22/24  8:27 AM   Specimen: Anterior Nasal Swab  Result Value Ref Range Status   SARS Coronavirus 2 by RT PCR NEGATIVE NEGATIVE Final   Influenza A by PCR NEGATIVE NEGATIVE Final   Influenza B by PCR NEGATIVE NEGATIVE Final    Comment: (NOTE) The Xpert Xpress SARS-CoV-2/FLU/RSV plus assay is intended as an aid in the diagnosis of influenza from Nasopharyngeal swab specimens and should not be used as a sole basis for treatment. Nasal washings and aspirates are unacceptable for Xpert Xpress SARS-CoV-2/FLU/RSV testing.  Fact Sheet for Patients: BloggerCourse.com  Fact Sheet for Healthcare Providers: SeriousBroker.it  This test is not yet approved or cleared by the [place]  FDA and has been authorized for detection and/or diagnosis of SARS-CoV-2 by FDA under an Emergency Use Authorization (EUA). This EUA will remain in effect (meaning this test can be used) for the duration of the COVID-19 declaration under Section 564(b)(1) of the Act, 21 U.S.C. section 360bbb-3(b)(1), unless the authorization is terminated or revoked.     Resp Syncytial Virus by PCR NEGATIVE NEGATIVE Final    Comment: (NOTE) Fact Sheet for Patients: BloggerCourse.com  Fact Sheet for Healthcare Providers: SeriousBroker.it  This test is not yet approved or cleared by the [place]  FDA and has been authorized for detection and/or diagnosis of  SARS-CoV-2 by FDA under an Emergency Use Authorization (EUA). This EUA will remain in effect (meaning this test can be used) for the duration  of the COVID-19 declaration under Section 564(b)(1) of the Act, 21 U.S.C. section 360bbb-3(b)(1), unless the authorization is terminated or revoked.  Performed at [facility], [address], [place], [place] [address]   Urine Culture     Status: Abnormal   Collection Time: 04/22/24  8:27 AM   Specimen: Urine, Random  Result Value Ref Range Status   Specimen Description URINE, RANDOM  Final   Special Requests   Final    NONE Reflexed from [id number] Performed at [facility], [address] [place], [place] [address]    Culture >=100,000 COLONIES/mL ESCHERICHIA COLI (A)  Final   Report Status 04/24/2024 FINAL  Final   Organism ID, Bacteria ESCHERICHIA COLI (A)  Final      Susceptibility   Escherichia coli - MIC*    AMPICILLIN >=32 RESISTANT Resistant     CEFAZOLIN  (URINE) Value in next row Sensitive      4 SENSITIVEThis is a modified FDA-approved test that has been validated and its performance characteristics determined by the reporting laboratory.  This laboratory is certified under the Clinical Laboratory Improvement Amendments CLIA as qualified to perform high complexity clinical laboratory testing.    CEFEPIME Value in next row Sensitive      4 SENSITIVEThis is a modified FDA-approved test that has been validated and its performance characteristics determined by the reporting laboratory.  This laboratory is certified under the Clinical Laboratory Improvement Amendments CLIA as qualified to perform high complexity clinical laboratory testing.    ERTAPENEM  Value in next row Sensitive      4 SENSITIVEThis is a modified FDA-approved test that has been validated and its performance characteristics determined by the reporting laboratory.  This laboratory is certified under the Clinical Laboratory Improvement Amendments CLIA as qualified  to perform high complexity clinical laboratory testing.    CEFTRIAXONE  Value in next row Sensitive      4 SENSITIVEThis is a modified FDA-approved test that has been validated and its performance characteristics determined by the reporting laboratory.  This laboratory is certified under the Clinical Laboratory Improvement Amendments CLIA as qualified to perform high complexity clinical laboratory testing.    CIPROFLOXACIN Value in next row Sensitive      4 SENSITIVEThis is a modified FDA-approved test that has been validated and its performance characteristics determined by the reporting laboratory.  This laboratory is certified under the Clinical Laboratory Improvement Amendments CLIA as qualified to perform high complexity clinical laboratory testing.    GENTAMICIN Value in next row Sensitive      4 SENSITIVEThis is a modified FDA-approved test that has been validated and its performance characteristics determined by the reporting laboratory.  This laboratory is certified under the Clinical Laboratory Improvement Amendments CLIA as qualified to perform high complexity clinical laboratory testing.    NITROFURANTOIN Value in next row Sensitive      4 SENSITIVEThis is a modified FDA-approved test that has been validated and its performance characteristics determined by the reporting laboratory.  This laboratory is certified under the Clinical Laboratory Improvement Amendments CLIA as qualified to perform high complexity clinical laboratory testing.    TRIMETH/SULFA Value in next row Sensitive      4 SENSITIVEThis is a modified FDA-approved test that has been validated and its performance characteristics determined by the reporting laboratory.  This laboratory is certified under the Clinical Laboratory Improvement Amendments CLIA as qualified to perform high complexity clinical laboratory testing.    AMPICILLIN/SULBACTAM Value in next row Resistant  4 SENSITIVEThis is a modified FDA-approved test that  has been validated and its performance characteristics determined by the reporting laboratory.  This laboratory is certified under the Clinical Laboratory Improvement Amendments CLIA as qualified to perform high complexity clinical laboratory testing.    PIP/TAZO Value in next row Sensitive ug/mL     <=4 SENSITIVEThis is a modified FDA-approved test that has been validated and its performance characteristics determined by the reporting laboratory.  This laboratory is certified under the Clinical Laboratory Improvement Amendments CLIA as qualified to perform high complexity clinical laboratory testing.    MEROPENEM Value in next row Sensitive      <=4 SENSITIVEThis is a modified FDA-approved test that has been validated and its performance characteristics determined by the reporting laboratory.  This laboratory is certified under the Clinical Laboratory Improvement Amendments CLIA as qualified to perform high complexity clinical laboratory testing.    * >=100,000 COLONIES/mL ESCHERICHIA COLI     Labs: Basic Metabolic Panel: Recent Labs  Lab 04/22/24 0801 04/23/24 0611 04/24/24 0725  NA 139 138 138  K 4.1 3.8 3.8  CL 103 106 106  CO2 23 22 24   GLUCOSE 226* 113* 109*  BUN 20 25* 22  CREATININE 1.64* 1.46* 1.41*  CALCIUM  9.2 8.5* 8.6*  MG  --   --  1.7   Liver Function Tests: Recent Labs  Lab 04/22/24 0801 04/24/24 0725  AST 22 19  ALT 15 13  ALKPHOS 55 40  BILITOT 1.0 0.4  PROT 6.6 5.8*  ALBUMIN  3.4* 2.7*   CBC: Recent Labs  Lab 04/22/24 0801 04/23/24 0611 04/24/24 0725  WBC 7.3 9.0 5.3  NEUTROABS 6.1  --   --   HGB 8.7* 7.6* 7.4*  HCT 27.4* 24.1* 22.9*  MCV 91.6 90.9 89.1  PLT 231 208 182   CBG: Recent Labs  Lab 04/23/24 1126 04/23/24 1617 04/23/24 2016 04/24/24 0743 04/24/24 1105  GLUCAP 99 285* 141* 106* 245*   Hgb A1c Recent Labs    04/22/24 0801  HGBA1C 8.4*   Lipid Profile No results for input(s): CHOL, HDL, LDLCALC, TRIG, CHOLHDL,  LDLDIRECT in the last 72 hours. Thyroid function studies No results for input(s): TSH, T4TOTAL, T3FREE, THYROIDAB in the last 72 hours.  Invalid input(s): FREET3 Urinalysis    Component Value Date/Time   COLORURINE YELLOW 04/22/2024 0827   APPEARANCEUR CLOUDY (A) 04/22/2024 0827   LABSPEC 1.010 04/22/2024 0827   PHURINE 7.0 04/22/2024 0827   GLUCOSEU 150 (A) 04/22/2024 0827   HGBUR MODERATE (A) 04/22/2024 0827   BILIRUBINUR NEGATIVE 04/22/2024 0827   BILIRUBINUR negative 05/03/2018 0817   KETONESUR NEGATIVE 04/22/2024 0827   PROTEINUR 100 (A) 04/22/2024 0827   UROBILINOGEN negative (A) 05/03/2018 0817   UROBILINOGEN 0.2 07/04/2012 1433   NITRITE NEGATIVE 04/22/2024 0827   LEUKOCYTESUR LARGE (A) 04/22/2024 0827    FURTHER DISCHARGE INSTRUCTIONS:   Get Medicines reviewed and adjusted: Please take all your medications with you for your next visit with your Primary MD   Laboratory/radiological data: Please request your Primary MD to go over all hospital tests and procedure/radiological results at the follow up, please ask your Primary MD to get all Hospital records sent to his/her office.   In some cases, they will be blood work, cultures and biopsy results pending at the time of your discharge. Please request that your primary care M.D. goes through all the records of your hospital data and follows up on these results.   Also Note the following: If  you experience worsening of your admission symptoms, develop shortness of breath, life threatening emergency, suicidal or homicidal thoughts you must seek medical attention immediately by calling 911 or calling your MD immediately  if symptoms less severe.   You must read complete instructions/literature along with all the possible adverse reactions/side effects for all the Medicines you take and that have been prescribed to you. Take any new Medicines after you have completely understood and accpet all the possible adverse  reactions/side effects.    Do not drive when taking Pain medications or sleeping medications (Benzodaizepines)   Do not take more than prescribed Pain, Sleep and Anxiety Medications. It is not advisable to combine anxiety,sleep and pain medications without talking with your primary care practitioner   Special Instructions: If you have smoked or chewed Tobacco  in the last 2 yrs please stop smoking, stop any regular Alcohol   and or any Recreational drug use.   Wear Seat belts while driving.   Please note: You were cared for by a hospitalist during your hospital stay. Once you are discharged, your primary care physician will handle any further medical issues. Please note that NO REFILLS for any discharge medications will be authorized once you are discharged, as it is imperative that you return to your primary care physician (or establish a relationship with a primary care physician if you do not have one) for your post hospital discharge needs so that they can reassess your need for medications and monitor your lab values.  Time coordinating discharge: 35 minutes  SIGNED:  Nilda Fendt, MD, PhD 04/24/2024, 11:24 AM

## 2024-04-24 NOTE — Progress Notes (Signed)
 [name] to be discharged Home per MD order. Discussed prescriptions and follow up appointments and medication list with wife at bedside and explained in detail. Patient's wife verbalized understanding.  Skin clean, dry and intact without evidence of skin break down, no evidence of skin tears noted. IV catheter discontinued intact. Site without signs and symptoms of complications. Dressing and pressure applied. Pt denies pain at the site currently. No complaints noted.  Patient free of lines, drains, and wounds.   An After Visit Summary (AVS) was printed and given to the patient. Patient escorted via wheelchair, and discharged home via private auto.  Franciso Bodily, RN

## 2024-04-24 NOTE — Discharge Instructions (Addendum)
 Follow with Millicent Redell POUR, MD in 4-5 days  Start taking levofloxacin  on Monday, September 1.  Take it every other day for 3 doses, Monday morning, Wednesday morning, and Friday morning.  Please see your primary care doctor next week  Please get a complete blood count and chemistry panel checked by your Primary MD at your next visit, and again as instructed by your Primary MD. Please get your medications reviewed and adjusted by your Primary MD.  Please request your Primary MD to go over all Hospital Tests and Procedure/Radiological results at the follow up, please get all Hospital records sent to your Prim MD by signing hospital release before you go home.  In some cases, there will be blood work, cultures and biopsy results pending at the time of your discharge. Please request that your primary care M.D. goes through all the records of your hospital data and follows up on these results.  If you had Pneumonia of Lung problems at the Hospital: Please get a 2 view Chest X ray done in 6-8 weeks after hospital discharge or sooner if instructed by your Primary MD.  If you have Congestive Heart Failure: Please call your Cardiologist or Primary MD anytime you have any of the following symptoms:  1) 3 pound weight gain in 24 hours or 5 pounds in 1 week  2) shortness of breath, with or without a dry hacking cough  3) swelling in the hands, feet or stomach  4) if you have to sleep on extra pillows at night in order to breathe  Follow cardiac low salt diet and 1.5 lit/day fluid restriction.  If you have diabetes Accuchecks 4 times/day, Once in AM empty stomach and then before each meal. Log in all results and show them to your primary doctor at your next visit. If any glucose reading is under 80 or above 300 call your primary MD immediately.  If you have Seizure/Convulsions/Epilepsy: Please do not drive, operate heavy machinery, participate in activities at heights or participate in high speed  sports until you have seen by Primary MD or a Neurologist and advised to do so again. Per [place]  DMV statutes, patients with seizures are not allowed to drive until they have been seizure-free for six months.  Use caution when using heavy equipment or power tools. Avoid working on ladders or at heights. Take showers instead of baths. Ensure the water temperature is not too high on the home water heater. Do not go swimming alone. Do not lock yourself in a room alone (i.e. bathroom). When caring for infants or small children, sit down when holding, feeding, or changing them to minimize risk of injury to the child in the event you have a seizure. Maintain good sleep hygiene. Avoid alcohol .   If you had Gastrointestinal Bleeding: Please ask your Primary MD to check a complete blood count within one week of discharge or at your next visit. Your endoscopic/colonoscopic biopsies that are pending at the time of discharge, will also need to followed by your Primary MD.  Get Medicines reviewed and adjusted. Please take all your medications with you for your next visit with your Primary MD  Please request your Primary MD to go over all hospital tests and procedure/radiological results at the follow up, please ask your Primary MD to get all Hospital records sent to his/her office.  If you experience worsening of your admission symptoms, develop shortness of breath, life threatening emergency, suicidal or homicidal thoughts you must seek medical attention  immediately by calling 911 or calling your MD immediately  if symptoms less severe.  You must read complete instructions/literature along with all the possible adverse reactions/side effects for all the Medicines you take and that have been prescribed to you. Take any new Medicines after you have completely understood and accpet all the possible adverse reactions/side effects.   Do not drive or operate heavy machinery when taking Pain medications.   Do  not take more than prescribed Pain, Sleep and Anxiety Medications  Special Instructions: If you have smoked or chewed Tobacco  in the last 2 yrs please stop smoking, stop any regular Alcohol   and or any Recreational drug use.  Wear Seat belts while driving.  Please note You were cared for by a hospitalist during your hospital stay. If you have any questions about your discharge medications or the care you received while you were in the hospital after you are discharged, you can call the unit and asked to speak with the hospitalist on call if the hospitalist that took care of you is not available. Once you are discharged, your primary care physician will handle any further medical issues. Please note that NO REFILLS for any discharge medications will be authorized once you are discharged, as it is imperative that you return to your primary care physician (or establish a relationship with a primary care physician if you do not have one) for your aftercare needs so that they can reassess your need for medications and monitor your lab values.  You can reach the hospitalist office at phone [phone] or fax [phone]   If you do not have a primary care physician, you can call [phone] for a physician referral.  Activity: As tolerated with Full fall precautions use walker/cane & assistance as needed    Diet: regular  Disposition Home

## 2024-07-10 ENCOUNTER — Emergency Department (HOSPITAL_COMMUNITY)
Admission: EM | Admit: 2024-07-10 | Discharge: 2024-07-10 | Disposition: A | Discharge status: Home / Self Care | Attending: Emergency Medicine | Admitting: Emergency Medicine

## 2024-07-10 ENCOUNTER — Emergency Department (HOSPITAL_COMMUNITY)

## 2024-07-10 ENCOUNTER — Encounter (HOSPITAL_COMMUNITY): Payer: Self-pay

## 2024-07-10 ENCOUNTER — Other Ambulatory Visit: Payer: Self-pay

## 2024-07-10 DIAGNOSIS — Z7982 Long term (current) use of aspirin: Secondary | ICD-10-CM | POA: Diagnosis not present

## 2024-07-10 DIAGNOSIS — Z79899 Other long term (current) drug therapy: Secondary | ICD-10-CM | POA: Insufficient documentation

## 2024-07-10 DIAGNOSIS — R001 Bradycardia, unspecified: Secondary | ICD-10-CM | POA: Insufficient documentation

## 2024-07-10 DIAGNOSIS — Z794 Long term (current) use of insulin: Secondary | ICD-10-CM | POA: Diagnosis not present

## 2024-07-10 DIAGNOSIS — E1122 Type 2 diabetes mellitus with diabetic chronic kidney disease: Secondary | ICD-10-CM | POA: Insufficient documentation

## 2024-07-10 DIAGNOSIS — R569 Unspecified convulsions: Secondary | ICD-10-CM | POA: Insufficient documentation

## 2024-07-10 DIAGNOSIS — F0390 Unspecified dementia without behavioral disturbance: Secondary | ICD-10-CM | POA: Insufficient documentation

## 2024-07-10 DIAGNOSIS — Z7984 Long term (current) use of oral hypoglycemic drugs: Secondary | ICD-10-CM | POA: Insufficient documentation

## 2024-07-10 DIAGNOSIS — I509 Heart failure, unspecified: Secondary | ICD-10-CM | POA: Diagnosis not present

## 2024-07-10 DIAGNOSIS — R55 Syncope and collapse: Secondary | ICD-10-CM | POA: Diagnosis not present

## 2024-07-10 DIAGNOSIS — I130 Hypertensive heart and chronic kidney disease with heart failure and stage 1 through stage 4 chronic kidney disease, or unspecified chronic kidney disease: Secondary | ICD-10-CM | POA: Diagnosis not present

## 2024-07-10 DIAGNOSIS — N189 Chronic kidney disease, unspecified: Secondary | ICD-10-CM | POA: Insufficient documentation

## 2024-07-10 LAB — URINALYSIS, ROUTINE W REFLEX MICROSCOPIC
Bacteria, UA: NONE SEEN
Bilirubin Urine: NEGATIVE
Glucose, UA: >=500 mg/dL — AB
Hgb urine dipstick: NEGATIVE
Ketones, ur: NEGATIVE mg/dL
Leukocytes,Ua: NEGATIVE
Nitrite: NEGATIVE
Protein, ur: NEGATIVE mg/dL
Specific Gravity, Urine: 1.012 (ref 1.005–1.030)
pH: 8 (ref 5.0–8.0)

## 2024-07-10 LAB — CBC WITH DIFFERENTIAL/PLATELET
Abs Immature Granulocytes: 0.03 K/uL (ref 0.00–0.07)
Basophils Absolute: 0 K/uL (ref 0.0–0.1)
Basophils Relative: 0 %
Eosinophils Absolute: 0.2 K/uL (ref 0.0–0.5)
Eosinophils Relative: 3 %
HCT: 29.6 % — ABNORMAL LOW (ref 39.0–52.0)
Hemoglobin: 9.7 g/dL — ABNORMAL LOW (ref 13.0–17.0)
Immature Granulocytes: 1 %
Lymphocytes Relative: 28 %
Lymphs Abs: 1.7 K/uL (ref 0.7–4.0)
MCH: 29.2 pg (ref 26.0–34.0)
MCHC: 32.8 g/dL (ref 30.0–36.0)
MCV: 89.2 fL (ref 80.0–100.0)
Monocytes Absolute: 0.5 K/uL (ref 0.1–1.0)
Monocytes Relative: 8 %
Neutro Abs: 3.7 K/uL (ref 1.7–7.7)
Neutrophils Relative %: 60 %
Platelets: 164 K/uL (ref 150–400)
RBC: 3.32 MIL/uL — ABNORMAL LOW (ref 4.22–5.81)
RDW: 14.5 % (ref 11.5–15.5)
WBC: 6 K/uL (ref 4.0–10.5)
nRBC: 0 % (ref 0.0–0.2)

## 2024-07-10 LAB — COMPREHENSIVE METABOLIC PANEL WITH GFR
ALT: 16 U/L (ref 0–44)
AST: 24 U/L (ref 15–41)
Albumin: 3.3 g/dL — ABNORMAL LOW (ref 3.5–5.0)
Alkaline Phosphatase: 51 U/L (ref 38–126)
Anion gap: 9 (ref 5–15)
BUN: 18 mg/dL (ref 8–23)
CO2: 22 mmol/L (ref 22–32)
Calcium: 8.9 mg/dL (ref 8.9–10.3)
Chloride: 106 mmol/L (ref 98–111)
Creatinine, Ser: 1.27 mg/dL — ABNORMAL HIGH (ref 0.61–1.24)
GFR, Estimated: 54 mL/min — ABNORMAL LOW (ref >60)
Glucose, Bld: 289 mg/dL — ABNORMAL HIGH (ref 70–99)
Potassium: 4.1 mmol/L (ref 3.5–5.1)
Sodium: 137 mmol/L (ref 135–145)
Total Bilirubin: 0.5 mg/dL (ref 0.0–1.2)
Total Protein: 6.7 g/dL (ref 6.5–8.1)

## 2024-07-10 LAB — I-STAT CHEM 8, ED
BUN: 21 mg/dL (ref 8–23)
Calcium, Ion: 1.2 mmol/L (ref 1.15–1.40)
Chloride: 106 mmol/L (ref 98–111)
Creatinine, Ser: 1.3 mg/dL — ABNORMAL HIGH (ref 0.61–1.24)
Glucose, Bld: 287 mg/dL — ABNORMAL HIGH (ref 70–99)
HCT: 29 % — ABNORMAL LOW (ref 39.0–52.0)
Hemoglobin: 9.9 g/dL — ABNORMAL LOW (ref 13.0–17.0)
Potassium: 4.1 mmol/L (ref 3.5–5.1)
Sodium: 138 mmol/L (ref 135–145)
TCO2: 25 mmol/L (ref 22–32)

## 2024-07-10 LAB — MAGNESIUM: Magnesium: 1.9 mg/dL (ref 1.7–2.4)

## 2024-07-10 LAB — CBG MONITORING, ED: Glucose-Capillary: 305 mg/dL — ABNORMAL HIGH (ref 70–99)

## 2024-07-10 NOTE — ED Triage Notes (Signed)
 Patient BIB EMS from home after wife states that patient had a focal seizure that lasted about 2-3 seconds where it appeared he stared for a little while and then laid back. Patient has history of this, last time it was in August when he had a UTI. EMS reports ammonia smell on chair that patient was sitting in. History of dementia. Patient was hypotensive to start with 86/42 sitting, laying was 139/75.

## 2024-07-10 NOTE — ED Provider Notes (Signed)
 [place] EMERGENCY DEPARTMENT AT [facility] Provider Note   CSN: [id number] Arrival date & time: 07/10/24  1023     Patient presents with: Seizures   Brian [name] is a 89 y.o. male.   Pt is a 89 yo male with pmhx significant for dementia, DM2, HTN, HLD, arthritis, CKD, CHF, anemia, and AS s/p TAVR.  Pt presents to the ED today with a possible seizure. Pt's wife said he was sitting at the table eating breakfast when his eyes rolled back and he would not answer her for a few seconds.  Pt then woke up, but was initially confused.  He is now back to baseline.  She is concerned that he has a UTI as this has happened in the past with a UTI.  Pt denies any pain.         Prior to Admission medications   Medication Sig Start Date End Date Taking? Authorizing Provider  ACCU-CHEK GUIDE test strip USE UP TO 4 TIMES A DAY AS DIRECTED 03/06/19   Poulose, Almarie BRAVO, NP  acetaminophen  (TYLENOL ) 650 MG CR tablet Take 1,300 mg by mouth daily.    [provider]  aspirin  EC 81 MG tablet Take 81 mg by mouth daily.     [provider]  diclofenac Sodium (VOLTAREN) 1 % GEL Apply 1 Application topically 2 (two) times daily as needed (pain).    [provider]  docusate sodium  (DOK) 100 MG capsule Take 1 capsule (100 mg total) by mouth 2 (two) times daily as needed. Patient taking differently: Take 100 mg by mouth daily. 10/27/17   Hinton Newell SQUIBB, MD  ferrous sulfate  325 (65 FE) MG tablet Take 1 tablet (325 mg total) by mouth daily with breakfast. 04/24/24 07/23/24  Gherghe, Costin M, MD  finasteride  (PROSCAR ) 5 MG tablet Take 1 tablet (5 mg total) by mouth daily. 06/30/19   Tapia, Leisa, PA-C  gabapentin  (NEURONTIN ) 300 MG capsule Take 300 mg by mouth at bedtime.    [provider]  insulin  glargine-yfgn (SEMGLEE , YFGN,) 100 UNIT/ML Pen Inject 5 Units into the skin daily.    [provider]  Insulin  Pen Needle (B-D UF III MINI PEN NEEDLES) 31G X  5 MM MISC USE AS DIRECTED WITH LEVEMIR  INSULIN  PENS AT BEDTIME, once a day 10/20/18   [name], Melinda P, MD  Omega-3 Fatty Acids (FISH OIL  PO) Take 1 capsule by mouth daily.    [provider]  pantoprazole  (PROTONIX ) 40 MG tablet Take 1 tablet (40 mg total) by mouth 2 (two) times daily. After completing 2 months, take 40 mg daily. Patient taking differently: Take 40 mg by mouth daily. 03/24/23 09/30/24  Perri DELENA Meliton Mickey., MD  predniSONE  (DELTASONE ) 5 MG tablet Take 5 mg by mouth daily.    [provider]  QUEtiapine  (SEROQUEL ) 25 MG tablet Take 25 mg by mouth every evening. 09/22/23   [provider]  QUEtiapine  (SEROQUEL ) 50 MG tablet Take 50 mg by mouth at bedtime as needed (agitation).    [provider]  tamsulosin  (FLOMAX ) 0.4 MG CAPS capsule TAKE 1 CAPSULE BY MOUTH EVERY DAY 11/03/19   Tapia, Leisa, PA-C    Allergies: Coreg  [carvedilol ], Niaspan [niacin er (antihyperlipidemic)], Norvasc  [amlodipine ], Shellfish allergy, Statins, Sulfa antibiotics, Zetia [ezetimibe], and Januvia  [sitagliptin ]    Review of Systems  All other systems reviewed and are negative.   Updated Vital Signs BP (!) 174/67 (BP Location: Right Arm)   Pulse (!) 49  Temp 98.1 F (36.7 C) (Oral)   Resp 14   SpO2 100%   Physical Exam Vitals and nursing note reviewed.  Constitutional:      Appearance: Normal appearance.  HENT:     Head: Normocephalic and atraumatic.     Right Ear: External ear normal.     Left Ear: External ear normal.     Nose: Nose normal.     Mouth/Throat:     Mouth: Mucous membranes are moist.     Pharynx: Oropharynx is clear.  Eyes:     Extraocular Movements: Extraocular movements intact.     Conjunctiva/sclera: Conjunctivae normal.     Pupils: Pupils are equal, round, and reactive to light.  Cardiovascular:     Rate and Rhythm: Normal rate and regular rhythm.     Pulses: Normal pulses.     Heart sounds: Normal heart sounds.  Pulmonary:      Effort: Pulmonary effort is normal.     Breath sounds: Normal breath sounds.  Abdominal:     General: Abdomen is flat. Bowel sounds are normal.     Palpations: Abdomen is soft.  Musculoskeletal:        General: Normal range of motion.     Cervical back: Normal range of motion and neck supple.  Skin:    General: Skin is warm.     Capillary Refill: Capillary refill takes less than 2 seconds.  Neurological:     General: No focal deficit present.     Mental Status: He is alert. Mental status is at baseline.  Psychiatric:        Mood and Affect: Mood normal.        Behavior: Behavior normal.     (all labs ordered are listed, but only abnormal results are displayed) Labs Reviewed  URINALYSIS, ROUTINE W REFLEX MICROSCOPIC - Abnormal; Notable for the following components:      Result Value   Color, Urine STRAW (*)    Glucose, UA >=500 (*)    All other components within normal limits  CBC WITH DIFFERENTIAL/PLATELET - Abnormal; Notable for the following components:   RBC 3.32 (*)    Hemoglobin 9.7 (*)    HCT 29.6 (*)    All other components within normal limits  COMPREHENSIVE METABOLIC PANEL WITH GFR - Abnormal; Notable for the following components:   Glucose, Bld 289 (*)    Creatinine, Ser 1.27 (*)    Albumin  3.3 (*)    GFR, Estimated 54 (*)    All other components within normal limits  CBG MONITORING, ED - Abnormal; Notable for the following components:   Glucose-Capillary 305 (*)    All other components within normal limits  I-STAT CHEM 8, ED - Abnormal; Notable for the following components:   Creatinine, Ser 1.30 (*)    Glucose, Bld 287 (*)    Hemoglobin 9.9 (*)    HCT 29.0 (*)    All other components within normal limits  MAGNESIUM   CBC WITH DIFFERENTIAL/PLATELET    EKG: EKG Interpretation Date/Time:  Sunday July 10 2024 11:05:06 EST Ventricular Rate:  46 PR Interval:  186 QRS Duration:  86 QT Interval:  446 QTC Calculation: 390 R Axis:   24  Text  Interpretation: Sinus bradycardia Septal infarct , age undetermined Abnormal ECG When compared with ECG of 22-Apr-2024 08:48, PREVIOUS ECG IS PRESENT Since last tracing rate slower Confirmed by Dean Clarity [phone]) on 07/10/2024 1:02:44 PM  Radiology: CT HEAD WO CONTRAST Result Date: 07/10/2024 CLINICAL DATA:  New onset seizure. EXAM: CT HEAD WITHOUT CONTRAST TECHNIQUE: Contiguous axial images were obtained from the base of the skull through the vertex without intravenous contrast. RADIATION DOSE REDUCTION: This exam was performed according to the departmental dose-optimization program which includes automated exposure control, adjustment of the mA and/or kV according to patient size and/or use of iterative reconstruction technique. COMPARISON:  11/14/2023 FINDINGS: Brain: Cavum septum variant is present. Ventricles are otherwise unremarkable. Remaining cisterns and CSF spaces are unchanged with evidence of age related atrophy. There is mild to moderate chronic ischemic microvascular disease. No mass, mass effect, shift of midline structures or acute hemorrhage. No evidence of acute infarction. Vascular: No hyperdense vessel or unexpected calcification. Skull: Normal. Negative for fracture or focal lesion. Sinuses/Orbits: No acute finding. Other: None. IMPRESSION: 1. No acute findings. 2. Age related atrophy and chronic ischemic microvascular disease. Electronically Signed   By: Toribio Agreste M.D.   On: 07/10/2024 13:08     Procedures   Medications Ordered in the ED - No data to display                                  Medical Decision Making Amount and/or Complexity of Data Reviewed Labs: ordered. Radiology: ordered.   This patient presents to the ED for concern of syncope vs seizure, this involves an extensive number of treatment options, and is a complaint that carries with it a high risk of complications and morbidity.  The differential diagnosis includes seizure, syncope, electrolyte  abn, anemia   Co morbidities that complicate the patient evaluation  dementia, DM2, HTN, HLD, arthritis, CKD, CHF, anemia, and AS s/p TAVR   Additional history obtained:  Additional history obtained from epic chart review External records from outside source obtained and reviewed including EMS report/wife   Lab Tests:  I Ordered, and personally interpreted labs.  The pertinent results include:  UA neg for UTI; cbc with hgb 9.7 (improved from 7.4 in August); bmp with cr 1.3 (stable)   Imaging Studies ordered:  I ordered imaging studies including ct head  I independently visualized and interpreted imaging which showed   No acute findings.  2. Age related atrophy and chronic ischemic microvascular disease.   I agree with the radiologist interpretation   Cardiac Monitoring:  The patient was maintained on a cardiac monitor.  I personally viewed and interpreted the cardiac monitored which showed an underlying rhythm of: sb   Medicines ordered and prescription drug management:   I have reviewed the patients home medicines and have made adjustments as needed   Test Considered:  ct   Problem List / ED Course:  Seizure-like activity:  I don't think pt had a seizure, but likely had syncope due to bradycardia.  He's had bradycardia in the past and was taken off metoprolol , but someone restarted him back on it.  His wife is told to hold metoprolol .  He is to f/u with Dr. Raford (cards).  Return if worse.    Reevaluation:  After the interventions noted above, I reevaluated the patient and found that they have :improved   Social Determinants of Health:  Lives at home   Dispostion:  After consideration of the diagnostic results and the patients response to treatment, I feel that the patent would benefit from discharge with outpatient f/u.       Final diagnoses:  Seizure-like activity (HCC)  Bradycardia  Syncope, unspecified syncope type  ED Discharge Orders           Ordered    Ambulatory referral to Cardiology        07/10/24 1559               Dean Clarity, MD 07/10/24 1605

## 2024-07-10 NOTE — Discharge Instructions (Addendum)
 STOP Metoprolol.

## 2024-10-27 ENCOUNTER — Ambulatory Visit (HOSPITAL_BASED_OUTPATIENT_CLINIC_OR_DEPARTMENT_OTHER): Admitting: Cardiovascular Disease
